# Patient Record
Sex: Female | Born: 1945
Health system: Southern US, Community
[De-identification: ages and names within clinical notes are randomized; demographics above are authoritative.]

## PROBLEM LIST (undated history)

## (undated) DIAGNOSIS — E785 Hyperlipidemia, unspecified: Secondary | ICD-10-CM

## (undated) DIAGNOSIS — C801 Malignant (primary) neoplasm, unspecified: Secondary | ICD-10-CM

## (undated) DIAGNOSIS — A4101 Sepsis due to Methicillin susceptible Staphylococcus aureus: Secondary | ICD-10-CM

## (undated) DIAGNOSIS — K625 Hemorrhage of anus and rectum: Secondary | ICD-10-CM

## (undated) DIAGNOSIS — K08109 Complete loss of teeth, unspecified cause, unspecified class: Secondary | ICD-10-CM

## (undated) DIAGNOSIS — F32A Depression, unspecified: Secondary | ICD-10-CM

## (undated) DIAGNOSIS — F419 Anxiety disorder, unspecified: Secondary | ICD-10-CM

## (undated) DIAGNOSIS — F329 Major depressive disorder, single episode, unspecified: Secondary | ICD-10-CM

## (undated) DIAGNOSIS — Z972 Presence of dental prosthetic device (complete) (partial): Secondary | ICD-10-CM

## (undated) DIAGNOSIS — Z973 Presence of spectacles and contact lenses: Secondary | ICD-10-CM

## (undated) DIAGNOSIS — Z923 Personal history of irradiation: Secondary | ICD-10-CM

## (undated) HISTORY — DX: Personal history of irradiation: Z92.3

## (undated) HISTORY — DX: Depression, unspecified: F32.A

## (undated) HISTORY — DX: Anxiety disorder, unspecified: F41.9

## (undated) HISTORY — DX: Hemorrhage of anus and rectum: K62.5

## (undated) HISTORY — DX: Hyperlipidemia, unspecified: E78.5

## (undated) HISTORY — DX: Major depressive disorder, single episode, unspecified: F32.9

## (undated) HISTORY — PX: TUBAL LIGATION: SHX77

---

## 1976-11-01 HISTORY — PX: ABDOMINAL HYSTERECTOMY: SHX81

## 1978-11-01 HISTORY — PX: HEMORRHOID SURGERY: SHX153

## 2005-09-13 ENCOUNTER — Other Ambulatory Visit: Admission: RE | Admit: 2005-09-13 | Discharge: 2005-09-13 | Payer: Self-pay | Admitting: Obstetrics and Gynecology

## 2005-11-15 ENCOUNTER — Encounter: Admission: RE | Admit: 2005-11-15 | Discharge: 2006-02-13 | Payer: Self-pay | Admitting: Obstetrics and Gynecology

## 2007-03-13 ENCOUNTER — Ambulatory Visit: Payer: Self-pay | Admitting: Cardiology

## 2007-07-23 ENCOUNTER — Emergency Department (HOSPITAL_COMMUNITY): Admission: EM | Admit: 2007-07-23 | Discharge: 2007-07-23 | Payer: Self-pay | Admitting: Emergency Medicine

## 2007-07-23 IMAGING — CR DG LUMBAR SPINE COMPLETE 4+V
5 series · 5 of 5 positions shown · non-contrast
Comparison: none

CLINICAL DATA: Low back pain.
 LUMBAR SPINE ? 5 VIEW:

[t l-spine a.p.]
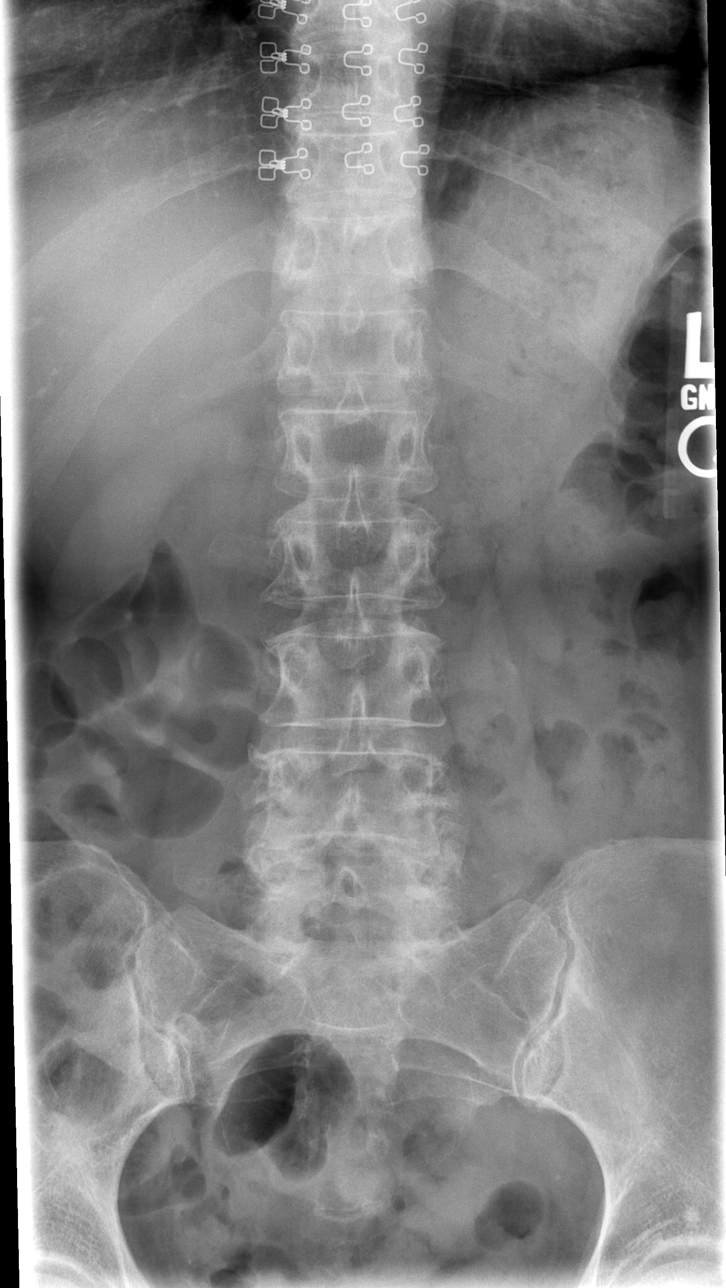

[t l-spine oblique exposure (1 of 2)]
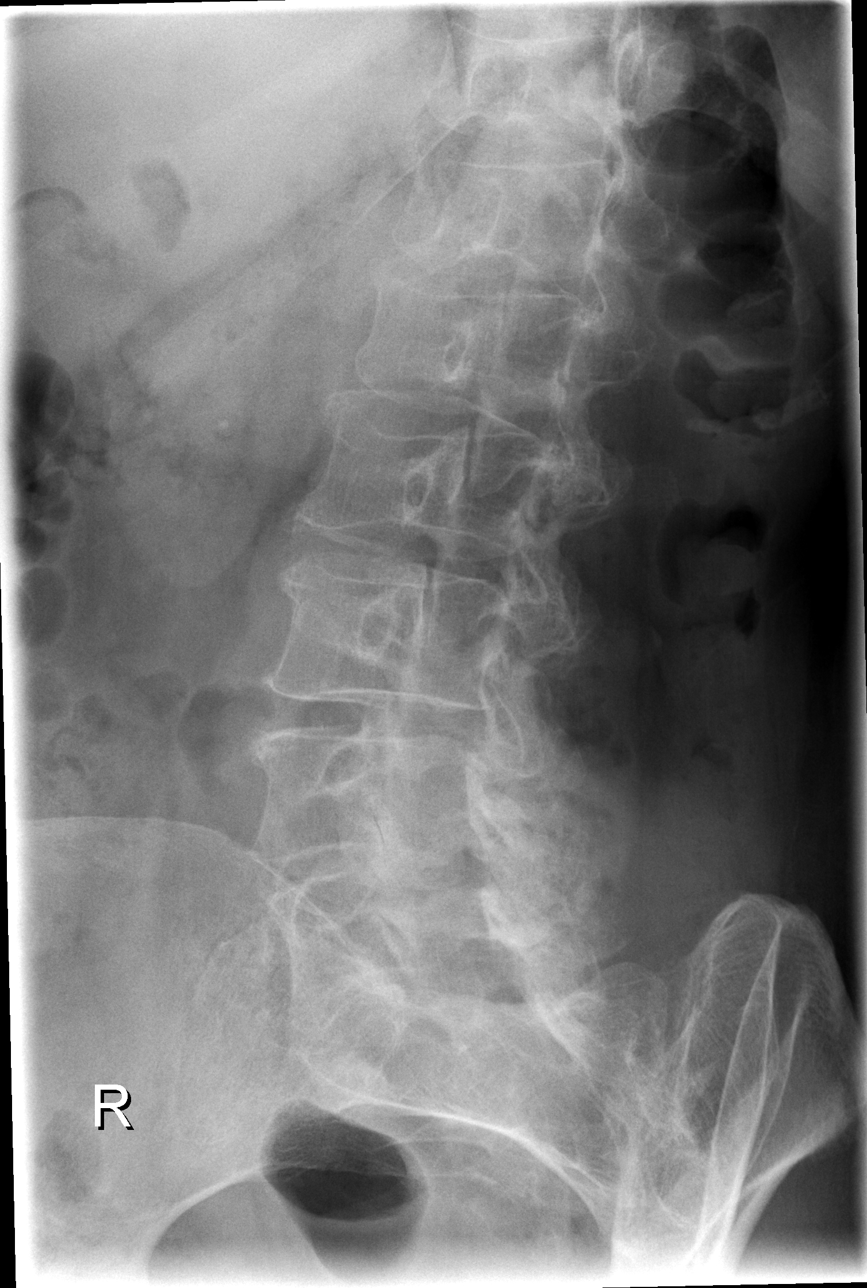

[t l-spine oblique exposure (2 of 2)]
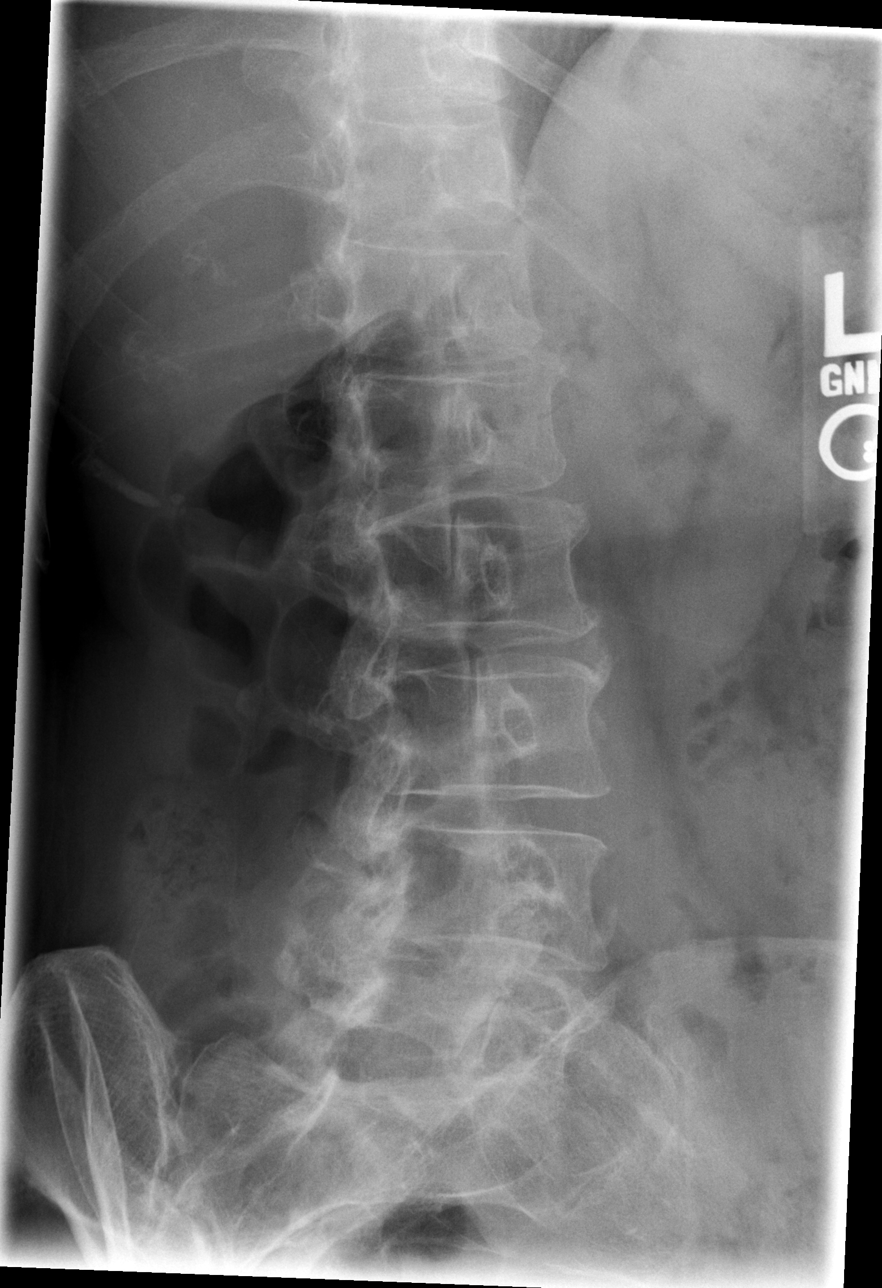

[t l-spine lat]
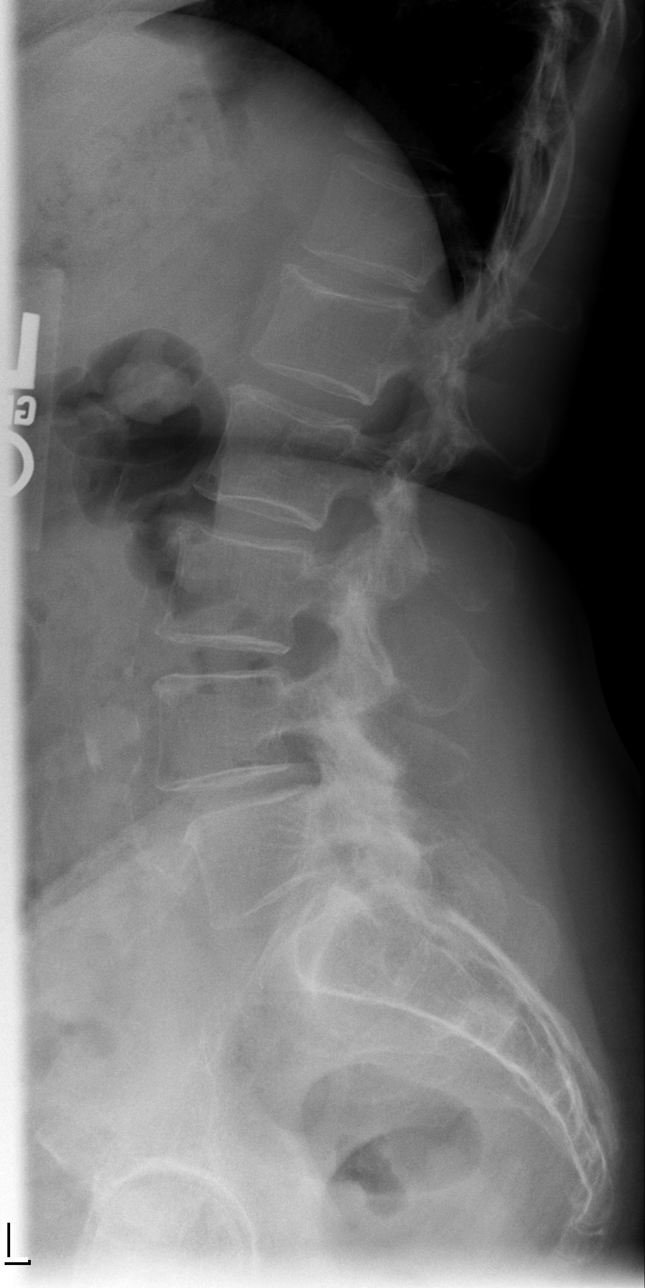

[t l-spine l5-s1 spot]
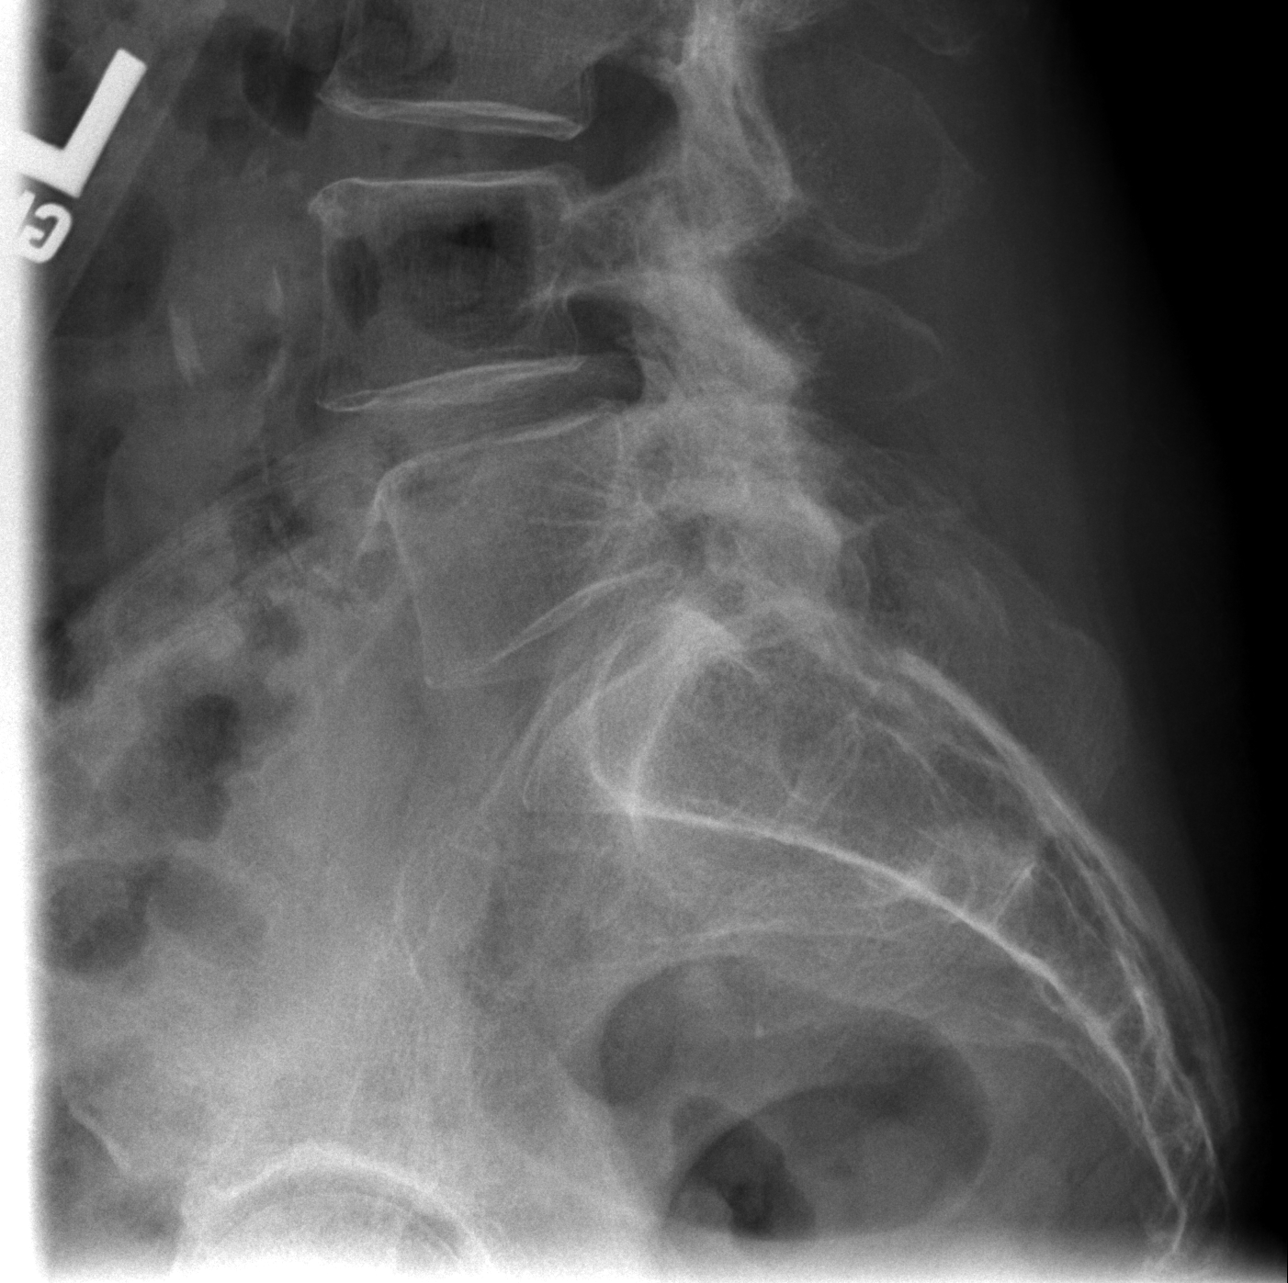

[5 of 5 positions shown; findings below may reference images not displayed]

FINDINGS: Diffuse osteopenia. Mild Grade 1 anterolisthesis of L-4 on L-5 7-8 mm associated with degenerative facet arthropathy. Minimal disk space narrowing and mild vertebral body osteophytic formation at L4-5 secondary to degenerative disk disease.   SI joints are unremarkable. 3 mm calculus mid to inferior aspect right kidney.
IMPRESSION: Degenerative spondylosis as described above. Small right renal calculus.

## 2010-07-10 ENCOUNTER — Encounter: Admission: RE | Admit: 2010-07-10 | Discharge: 2010-07-10 | Payer: Self-pay | Admitting: Obstetrics and Gynecology

## 2010-11-22 ENCOUNTER — Encounter: Payer: Self-pay | Admitting: Obstetrics and Gynecology

## 2011-03-16 NOTE — Assessment & Plan Note (Signed)
[facility]                            CARDIOLOGY OFFICE NOTE   NAME:[name], [name]                        MRN:          [id number]  DATE:03/13/2007                            DOB:          December 12, 1945    REFERRING PHYSICIAN:  Juluis Mire, M.D.   I was asked by Dr. Richardean Chimera to evaluate [name] for  hyperlipidemia and cardiac risk factors.   HISTORY OF PRESENT ILLNESS:  She is a 65 year old, divorced, white  female, mother of one who moved here from [place] to live near her  daughter.   She says that several of her siblings have hyperlipidemia and she thinks  hers is hereditary.   Her total cholesterol was 266, triglycerides 80, HDL 71, LDL 179, VLDL  16. Her total cholesterol/HDL ratio calculates at 3.7. As I told her,  this is outstanding.   She quit smoking in 1993 but was a one pack per day smoker prior to  that. She does drink a little bit of caffeinated coffee a day.   She walks 3 hours a week. She has for years.   She does not have hypertension or diabetes. She is only minimally  overweight.   PAST MEDICAL HISTORY:  She has no known drug allergies. She is currently  on Cymbalta 60 mg a day for depression and a multivitamin.   PAST SURGICAL HISTORY:  Hysterectomy in 1982, hemorrhoidectomy in 1984,  cyst on her arm removed in 1976.   FAMILY HISTORY:  Remarkable for hyperlipidemia. There is no premature  coronary artery disease however.   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  She is divorced and has one daughter.   REVIEW OF SYSTEMS:  History of menstrual dysfunction, anxiety and  depression, otherwise negative review of systems.   PHYSICAL EXAMINATION:  GENERAL:  She is very pleasant.  VITAL SIGNS:  5 foot 4, weighs 151 pounds. Her blood pressure is 122/80,  her pulse was 60 and regular. Her EKG is completely normal except for  nonspecific flattening of her ST segments V1 through  V6.  HEENT:  Normocephalic, atraumatic. PERRLA. Extraocular movements intact.  Sclera clear. Facial symmetry is normal.  NECK:  Carotid upstrokes equal bilaterally without bruits. There is no  JVD. Thyroid is not enlarged. Neck is supple.  LUNGS:  Clear to auscultation.  HEART:  Reveals a nondisplaced PMI. She has normal S1 and S2.  ABDOMEN:  Soft with good bowel sounds, no midline bruit, no  hepatomegaly.  EXTREMITIES:  Reveal no cyanosis, clubbing or edema. Pulses are intact.  NEUROLOGIC:  Intact.  SKIN:  Within normal limits.   ASSESSMENT/PLAN:  Hyperlipidemia with a very favorable total  cholesterol/HDL ratio. With no other significant risk factors other than  age, I would not treat this with pharmacological therapy. It also should  be noted that she walks 3 hours plus a week which reduces her risk by  20% plus of a coronary event.   I have made no specific recommendations other than continue her walking  program, walk her blood pressure  and keep her weight under control. I  will see her back on a p.r.n. basis.     Thomas C. Daleen Squibb, MD, [facility]  Electronically Signed    TCW/MedQ  DD: 03/13/2007  DT: 03/13/2007  Job #: [id number]   cc:   Juluis Mire, M.D.

## 2011-08-12 LAB — URINALYSIS, ROUTINE W REFLEX MICROSCOPIC
Glucose, UA: NEGATIVE
Ketones, ur: NEGATIVE
Protein, ur: NEGATIVE
Urobilinogen, UA: 0.2

## 2011-08-25 ENCOUNTER — Encounter (INDEPENDENT_AMBULATORY_CARE_PROVIDER_SITE_OTHER): Payer: Self-pay | Admitting: General Surgery

## 2011-09-07 ENCOUNTER — Ambulatory Visit (INDEPENDENT_AMBULATORY_CARE_PROVIDER_SITE_OTHER): Payer: BC Managed Care – PPO | Admitting: General Surgery

## 2011-09-07 ENCOUNTER — Encounter (INDEPENDENT_AMBULATORY_CARE_PROVIDER_SITE_OTHER): Payer: Self-pay | Admitting: General Surgery

## 2011-09-07 VITALS — BP 144/86 | HR 60 | Temp 97.4°F | Resp 16 | Ht 65.0 in | Wt 145.2 lb

## 2011-09-07 DIAGNOSIS — Z803 Family history of malignant neoplasm of breast: Secondary | ICD-10-CM

## 2011-09-07 DIAGNOSIS — K624 Stenosis of anus and rectum: Secondary | ICD-10-CM

## 2011-09-07 DIAGNOSIS — E785 Hyperlipidemia, unspecified: Secondary | ICD-10-CM | POA: Insufficient documentation

## 2011-09-07 DIAGNOSIS — K648 Other hemorrhoids: Secondary | ICD-10-CM

## 2011-09-07 NOTE — Progress Notes (Signed)
Chief Complaint  Patient presents with  . Rectal Problems    Eval of hemorrhoids.    HPI [name] is a 65 y.o. female.    She is referred to me by Laurann Montana  for evaluation of bleeding hemorrhoids. The patient states that she had a hemorrhoidectomy 30 years ago. For the past 2 months she's been having some rectal bleeding. This occurs really during and after bowel movements. She denies pain or protrusion or prolapse. Her last colonoscopy was with one of the [facility] doctors in 2009 and she was told everything was normal except she may have had one small polyp. She has tried suppositories without relief. HPI  Past Medical History  Diagnosis Date  . Depression   . Hyperlipidemia   . Vitamin D deficiency   . Anxiety   . Rectal bleeding     Past Surgical History  Procedure Date  . Abdominal hysterectomy 1978  . Hemorrhoid surgery 1980    Patient had to guess on the date.    Family History  Problem Relation Age of Onset  . Cancer Sister     breast  . Alcohol abuse Maternal Uncle     Social History History  Substance Use Topics  . Smoking status: Former Smoker    Quit date: 09/06/1997  . Smokeless tobacco: Never Used  . Alcohol Use: No    No Known Allergies  Current Outpatient Prescriptions  Medication Sig Dispense Refill  . ECHINACEA PO Take by mouth as needed. Patient unsure of the dosage.       . Garlic 1000 MG CAPS Take by mouth as needed.        [name] MILK THISTLE PO Take 1,000 mg by mouth as needed.        [name] ascorbic acid (VITAMIN C) 500 MG/5ML syrup Take 500 mg by mouth daily.        . busPIRone (BUSPAR) 10 MG tablet Take 5 mg by mouth 2 (two) times daily.        . Calcium-Vitamin D (CALTRATE 600 PLUS-VIT D PO) Take by mouth 1 day or 1 dose.        . cholecalciferol (VITAMIN D) 1000 UNITS tablet Take 4,000 Units by mouth daily.        . DULoxetine (CYMBALTA) 60 MG capsule Take 60 mg by mouth daily.        . folic acid (FOLVITE) 1 MG tablet Take 1 mg by  mouth daily.        [name] ibuprofen (ADVIL,MOTRIN) 800 MG tablet Take 800 mg by mouth every 8 (eight) hours as needed.        . Multiple Vitamins-Minerals (MULTIVITAMIN WITH MINERALS) tablet Take 1 tablet by mouth daily.        . niacin 100 MG tablet Take 100 mg by mouth daily with breakfast.        . pravastatin (PRAVACHOL) 40 MG tablet Take 40 mg by mouth daily.        . vitamin E 100 UNIT capsule Take 100 Units by mouth daily.          Review of Systems Review of Systems  Constitutional: Negative for fever, chills and unexpected weight change.  HENT: Negative for hearing loss, congestion, sore throat, trouble swallowing and voice change.   Eyes: Negative for visual disturbance.  Respiratory: Negative for cough and wheezing.   Cardiovascular: Negative for chest pain, palpitations and leg swelling.  Gastrointestinal: Positive for constipation and anal bleeding. Negative  for nausea, vomiting, abdominal pain, diarrhea, blood in stool and abdominal distention.       Does not drink much water.  Genitourinary: Negative for hematuria, vaginal bleeding and difficulty urinating.  Musculoskeletal: Negative for arthralgias.  Skin: Negative for rash and wound.  Neurological: Negative for seizures, syncope and headaches.  Hematological: Negative for adenopathy. Does not bruise/bleed easily.  Psychiatric/Behavioral: Negative for confusion. The patient is nervous/anxious.     Blood pressure 144/86, pulse 60, temperature 97.4 F (36.3 C), temperature source Temporal, resp. rate 16, height 5\' 5"  (1.651 m), weight 145 lb 3.2 oz (65.862 kg).  Physical Exam Physical Exam  Constitutional: She is oriented to person, place, and time. She appears well-developed and well-nourished. No distress.       pleasant  HENT:  Head: Normocephalic and atraumatic.  Nose: Nose normal.  Mouth/Throat: No oropharyngeal exudate.  Eyes: Conjunctivae and EOM are normal. Pupils are equal, round, and reactive to light. Left eye  exhibits no discharge. No scleral icterus.  Neck: Neck supple. No JVD present. No tracheal deviation present. No thyromegaly present.  Cardiovascular: Normal rate, regular rhythm, normal heart sounds and intact distal pulses.   No murmur heard. Pulmonary/Chest: Effort normal and breath sounds normal. No respiratory distress. She has no wheezes. She has no rales. She exhibits no tenderness.  Abdominal: Soft. Bowel sounds are normal. She exhibits no distension and no mass. There is no tenderness. There is no rebound and no guarding.  Genitourinary:     Musculoskeletal: She exhibits no edema and no tenderness.  Lymphadenopathy:    She has no cervical adenopathy.  Neurological: She is alert and oriented to person, place, and time. She exhibits normal muscle tone. Coordination normal.  Skin: Skin is warm and dry. No rash noted. She is not diaphoretic. No erythema. No pallor.  Psychiatric: She has a normal mood and affect. Her behavior is normal. Judgment and thought content normal.    Data Reviewed I reviewed an office note from Dr. Cliffton Asters  Assessment    Bleeding internal hemorrhoids, injected today.  Status post surgical hemorrhoidectomy many years ago. She has residual scarring and probable anal stenosis.  Hyperlipidemia  Anxiety and depression  Status post abdominal hysterectomy and bilateral oophorectomy.    Plan    I injected internal hemorrhoids with sclerosing solution on the right side and left side today. She tolerated this well although it was somewhat uncomfortable.  I informed her that she had internal hemorrhoids and also an anal stenosis.  She was advised in hydration, high-fiber low-fat diet, and the use of Metamucil b.i.d.  I told her that if the bleeding is not resolve within one month, to return to the office for further evaluation.     Sherian Valenza M 09/07/2011, 9:14 AM

## 2011-09-07 NOTE — Patient Instructions (Signed)
We injected a medicine into your internal hemorrhoids today, which will hopefully resolve the bleeding. There is also a narrowing of your anal canal, presumably from scarring from previous surgery.You are advised to drink 5-6 glasses of water a day, follow a high-fiber, low-fat diet. Your are advised to take Metamucil capsules, 3-4 capsules twice a day. If the bleeding does not resolve within one month, please return to see me for further evaluation. You should have another colonoscopy in approximately 2 years.

## 2011-11-01 ENCOUNTER — Ambulatory Visit (INDEPENDENT_AMBULATORY_CARE_PROVIDER_SITE_OTHER): Payer: BC Managed Care – PPO | Admitting: General Surgery

## 2011-11-01 ENCOUNTER — Encounter (INDEPENDENT_AMBULATORY_CARE_PROVIDER_SITE_OTHER): Payer: Self-pay | Admitting: General Surgery

## 2011-11-01 VITALS — BP 152/88 | HR 77 | Temp 98.0°F | Ht 65.0 in | Wt 150.8 lb

## 2011-11-01 DIAGNOSIS — K624 Stenosis of anus and rectum: Secondary | ICD-10-CM

## 2011-11-01 DIAGNOSIS — K648 Other hemorrhoids: Secondary | ICD-10-CM

## 2011-11-01 NOTE — Patient Instructions (Signed)
We will schedule you for surgery. We will examine you once you are sleep to make sure there are no polyps in the upper rectum. We will dilate the anal stenosis. We will conservatively removed the internal hemorrhoids.\  Hemorrhoidectomy Hemorrhoidectomy is surgery to remove hemorrhoids. Hemorrhoids are veins that have become swollen in the rectum. The rectum is the area from the bottom end of the intestines to the opening where bowel movements leave the body. Hemorrhoids can be uncomfortable. They can cause itching, bleeding and pain if a blood clot forms in them (thrombose). If hemorrhoids are small, surgery may not be needed. But if they cover a larger area, surgery is usually suggested.  LET YOUR CAREGIVER KNOW ABOUT:   Any allergies.   All medications you are taking, including:   Herbs, eyedrops, over-the-counter medications and creams.   Blood thinners (anticoagulants), aspirin or other drugs that could affect blood clotting.   Use of steroids (by mouth or as creams).   Previous problems with anesthetics, including local anesthetics.   Possibility of pregnancy, if this applies.   Any history of blood clots.   Any history of bleeding or other blood problems.   Previous surgery.   Smoking history.   Other health problems.  RISKS AND COMPLICATIONS All surgery carries some risk. However, hemorrhoid surgery usually goes smoothly. Possible complications could include:  Urinary retention.   Bleeding.   Infection.   A painful incision.   A reaction to the anesthesia (this is not common).  BEFORE THE PROCEDURE   Stop using aspirin and non-steroidal anti-inflammatory drugs (NSAIDs) for pain relief. This includes prescription drugs and over-the-counter drugs such as ibuprofen and naproxen. Also stop taking vitamin E. If possible, do this two weeks before your surgery.   If you take blood-thinners, ask your healthcare provider when you should stop taking them.   You will  probably have blood and urine tests done several days before your surgery.   Do not eat or drink for about 8 hours before the surgery.   Arrive at least an hour before the surgery, or whenever your surgeon recommends. This will give you time to check in and fill out any needed paperwork.   Hemorrhoidectomy is often an outpatient procedure. This means you will be able to go home the same day. Sometimes, though, people stay overnight in the hospital after the procedure. Ask your surgeon what to expect. Either way, make arrangements in advance for someone to drive you home.  PROCEDURE   The preparation:   You will change into a hospital gown.   You will be given an IV. A needle will be inserted in your arm. Medication can flow directly into your body through this needle.   You might be given an enema to clear your rectum.   Once in the operating room, you will probably lie on your side or be repositioned later to lying on your stomach.   You will be given anesthesia (medication) so you will not feel anything during the surgery. The surgery often is done with local anesthesia (the area near the hemorrhoids will be numb and you will be drowsy but awake). Sometimes, general anesthesia is used (you will be asleep during the procedure).   The procedure:   There are a few different procedures for hemorrhoids. Be sure to ask you surgeon about the procedure, the risks and benefits.   Be sure to ask about what you need to do to take care of the wound, if there  is one.  AFTER THE PROCEDURE  You will stay in a recovery area until the anesthesia has worn off. Your blood pressure and pulse will be checked every so often.   You may feel a lot of pain in the area of the rectum.   Take all pain medication prescribed by your surgeon. Ask before taking any over-the-counter pain medicines.   Sometimes sitting in a warm bath can help relieve your pain.   To make sure you have bowel movements without  straining:   You will probably need to take stool softeners (usually a pill) for a few days.   You should drink 8 to 10 glasses of water each day.   Your activity will be restricted for awhile. Ask your caregiver for a list of what you should and should not do while you recover.  Document Released: 08/15/2009 Document Revised: 06/30/2011 Document Reviewed: 08/15/2009 [facility] Patient Information 2012 [place], [place].

## 2011-11-01 NOTE — Progress Notes (Signed)
Patient ID: [name], female   DOB: 03-23-1946, 65 y.o.   MRN: [id number]  Chief Complaint  Patient presents with  . Follow-up    bleeding hems/discuss sx    HPI [name] is a 65 y.o. female.  She returns to see me because of rectal bleeding.  I saw her in November of 2012. At that time she had some trivial external hemorrhoids, palpable internal hemorrhoids, and a mild case of anal stenosis. We injected her hemorrhoids.  She had a colonoscopy with [facility] in 2009 and was told that was normal.  Since I saw her she continues to have daily, low volume bright red rectal bleeding. Sometimes during her stools and sometimes just spontaneously. She is not passing any clots. This is low volume. She says her bowel movements are brown in color.  Another complaint is that she has some difficulty going to the bathroom and she has to go multiple times a day and passages to little bit each time. She denies pain with bowel movements. HPI  Past Medical History  Diagnosis Date  . Depression   . Hyperlipidemia   . Vitamin D deficiency   . Anxiety   . Rectal bleeding     Past Surgical History  Procedure Date  . Abdominal hysterectomy 1978  . Hemorrhoid surgery 1980    Patient had to guess on the date.    Family History  Problem Relation Age of Onset  . Cancer Sister     breast  . Alcohol abuse Maternal Uncle     Social History History  Substance Use Topics  . Smoking status: Former Smoker    Quit date: 09/06/1997  . Smokeless tobacco: Never Used  . Alcohol Use: No    No Known Allergies  Current Outpatient Prescriptions  Medication Sig Dispense Refill  . ascorbic acid (VITAMIN C) 500 MG/5ML syrup Take 500 mg by mouth daily.        . busPIRone (BUSPAR) 10 MG tablet Take 5 mg by mouth 2 (two) times daily.        . Calcium-Vitamin D (CALTRATE 600 PLUS-VIT D PO) Take by mouth 1 day or 1 dose.        . cholecalciferol (VITAMIN D) 1000 UNITS tablet Take 4,000 Units by  mouth daily.        . DULoxetine (CYMBALTA) 60 MG capsule Take 60 mg by mouth daily.        [name] ECHINACEA PO Take by mouth as needed. Patient unsure of the dosage.       . folic acid (FOLVITE) 1 MG tablet Take 1 mg by mouth daily.        . Garlic 1000 MG CAPS Take by mouth as needed.        [name] ibuprofen (ADVIL,MOTRIN) 800 MG tablet Take 800 mg by mouth every 8 (eight) hours as needed.        [name] MILK THISTLE PO Take 1,000 mg by mouth as needed.        . Multiple Vitamins-Minerals (MULTIVITAMIN WITH MINERALS) tablet Take 1 tablet by mouth daily.        . niacin 100 MG tablet Take 100 mg by mouth daily with breakfast.        . pravastatin (PRAVACHOL) 40 MG tablet Take 40 mg by mouth daily.        . vitamin E 100 UNIT capsule Take 100 Units by mouth daily.          Review  of Systems Review of Systems  Constitutional: Negative for fever, chills and unexpected weight change.  HENT: Negative for hearing loss, congestion, sore throat, trouble swallowing and voice change.   Eyes: Negative for visual disturbance.  Respiratory: Negative for cough and wheezing.   Cardiovascular: Negative for chest pain, palpitations and leg swelling.  Gastrointestinal: Positive for anal bleeding. Negative for nausea, vomiting, abdominal pain, diarrhea, constipation, blood in stool and abdominal distention.  Genitourinary: Negative for hematuria, vaginal bleeding and difficulty urinating.  Musculoskeletal: Negative for arthralgias.  Skin: Negative for rash and wound.  Neurological: Negative for seizures, syncope and headaches.  Hematological: Negative for adenopathy. Does not bruise/bleed easily.  Psychiatric/Behavioral: Negative for confusion.    Blood pressure 152/88, pulse 77, temperature 98 F (36.7 C), temperature source Temporal, height 5\' 5"  (1.651 m), weight 150 lb 12.8 oz (68.402 kg), SpO2 98.00%.  Physical Exam Physical Exam  Constitutional: She is oriented to person, place, and time. She appears  well-developed and well-nourished. No distress.  HENT:  Head: Normocephalic and atraumatic.  Nose: Nose normal.  Eyes: Conjunctivae and EOM are normal. Pupils are equal, round, and reactive to light. Left eye exhibits no discharge. No scleral icterus.  Neck: Neck supple. No JVD present. No tracheal deviation present. No thyromegaly present.  Cardiovascular: Normal rate, regular rhythm, normal heart sounds and intact distal pulses.   No murmur heard. Pulmonary/Chest: Effort normal and breath sounds normal. No respiratory distress. She has no wheezes. She has no rales. She exhibits no tenderness.  Abdominal: Soft. Bowel sounds are normal. She exhibits no distension and no mass. There is no tenderness. There is no rebound and no guarding.    Genitourinary:     Lymphadenopathy:    She has no cervical adenopathy.  Neurological: She is alert and oriented to person, place, and time. She exhibits normal muscle tone. Coordination normal.  Skin: Skin is warm. No rash noted. She is not diaphoretic. No erythema. No pallor.  Psychiatric: She has a normal mood and affect. Her behavior is normal. Judgment and thought content normal.    Data Reviewed:      old records  Assessment   Rectal bleeding. This is most likely due to anal canal source, most likely residual internal hemorrhoids or polyps.  Anal stenosis  Asymptomatic external hemorrhoids  History of surgical hemorrhoidectomy  Status post open abdominal hysterectomy.     Plan    I think that the next step in her care is examination under anesthesia, anal dilatation, rigid proctoscopy to rule out higher source in the rectum, and probably very conservative excision of the internal hemorrhoids that I can palpate. These hemorrhoids are too firm and too rigid to consider banding.  I told her that we would have to be very conservative because of her scar tissue from her previous hemorrhoidectomy. The risk of the surgery would be bleeding,  infection, repeat stenosis, sphincter damage. . I told her that this hopefully resolve her problems but if not, she would need to be referred to a colorectal specialist and probably consider skin flap advancement for her anal stenosis.  I offered to refer her to another physician and she declined that.  We will schedule the surgery in the near future.       Damoney Julia M 11/01/2011, 9:12 AM

## 2011-11-03 ENCOUNTER — Encounter (HOSPITAL_COMMUNITY): Payer: Self-pay | Admitting: Pharmacy Technician

## 2011-11-08 ENCOUNTER — Encounter (HOSPITAL_COMMUNITY)
Admission: RE | Admit: 2011-11-08 | Discharge: 2011-11-08 | Disposition: A | Discharge status: Home / Self Care | Payer: BC Managed Care – PPO | Source: Ambulatory Visit | Attending: General Surgery | Admitting: General Surgery

## 2011-11-08 ENCOUNTER — Encounter (HOSPITAL_COMMUNITY): Payer: Self-pay

## 2011-11-08 LAB — URINALYSIS, ROUTINE W REFLEX MICROSCOPIC
Bilirubin Urine: NEGATIVE
Glucose, UA: NEGATIVE mg/dL
Hgb urine dipstick: NEGATIVE
Nitrite: NEGATIVE
Specific Gravity, Urine: 1.005 (ref 1.005–1.030)
pH: 7 (ref 5.0–8.0)

## 2011-11-08 LAB — DIFFERENTIAL
Lymphocytes Relative: 40 % (ref 12–46)
Monocytes Absolute: 0.4 10*3/uL (ref 0.1–1.0)
Monocytes Relative: 8 % (ref 3–12)
Neutro Abs: 2.8 10*3/uL (ref 1.7–7.7)

## 2011-11-08 LAB — COMPREHENSIVE METABOLIC PANEL
AST: 20 U/L (ref 0–37)
Albumin: 4.1 g/dL (ref 3.5–5.2)
Calcium: 9.8 mg/dL (ref 8.4–10.5)
Chloride: 104 mEq/L (ref 96–112)
Creatinine, Ser: 0.66 mg/dL (ref 0.50–1.10)
Sodium: 141 mEq/L (ref 135–145)

## 2011-11-08 LAB — URINE MICROSCOPIC-ADD ON

## 2011-11-08 LAB — CBC
MCH: 30.1 pg (ref 26.0–34.0)
MCV: 91.3 fL (ref 78.0–100.0)
Platelets: 295 10*3/uL (ref 150–400)
RDW: 12.3 % (ref 11.5–15.5)
WBC: 5.7 10*3/uL (ref 4.0–10.5)

## 2011-11-08 LAB — SURGICAL PCR SCREEN: Staphylococcus aureus: NEGATIVE

## 2011-11-08 NOTE — Patient Instructions (Signed)
20 [name]  11/08/2011   Your procedure is scheduled on:  Wednesday 11/10/2011  Report to [facility] at 1130 AM.  Call this number if you have problems the morning of surgery: [phone]   Remember:   Do not eat food:After Midnight.  May have clear liquids:up to 6 Hours before arrival-nothing to drink after 0730 am.  Clear liquids include soda, tea, black coffee, apple or grape juice, broth.  Take these medicines the morning of surgery with A SIP OF WATER: Buspar, Cymbalta, Pravachol   Do not wear jewelry, make-up or nail polish.  Do not wear lotions, powders, or perfumes.   Do not shave 48 hours prior to surgery.(women only-No shaving legs)  Do not bring valuables to the hospital.  Contacts, dentures or bridgework may not be worn into surgery.  Leave suitcase in the car. After surgery it may be brought to your room.  For patients admitted to the hospital, checkout time is 11:00 AM the day of discharge.   Patients discharged the day of surgery will not be allowed to drive home.  Name and phone number of your driver: Clyda Hurdle , [phone]  Special Instructions: CHG Shower Use Special Wash: 1/2 bottle night before surgery and 1/2 bottle morning of surgery.   Please read over the following fact sheets that you were given: MRSA Information

## 2011-11-09 NOTE — H&P (Signed)
[name]   History and Physical  MRN: [id number]   Description: 66 year old female  Provider: [name], MD  Department: [facility]     Patient ID: [name], female   DOB: Nov 13, 1945, 66 y.o.   MRN: [id number]    Chief Complaint   Patient presents with   .  Follow-up       bleeding hems/discuss sx      HPI [name] is a 66 y.o. female.  She returns to see me because of rectal bleeding.  I saw her in November of 2012. At that time she had some trivial external hemorrhoids, palpable internal hemorrhoids, and a mild case of anal stenosis. We injected her hemorrhoids.  She had a colonoscopy with [facility] in 2009 and was told that was normal.  Since I saw her she continues to have daily, low volume bright red rectal bleeding. Sometimes during her stools and sometimes just spontaneously. She is not passing any clots. This is low volume. She says her bowel movements are brown in color.  Another complaint is that she has some difficulty going to the bathroom and she has to go multiple times a day and passages to little bit each time. She denies pain with bowel movements.     Past Medical History   Diagnosis  Date   .  Depression     .  Hyperlipidemia     .  Vitamin D deficiency     .  Anxiety     .  Rectal bleeding         Past Surgical History   Procedure  Date   .  Abdominal hysterectomy  1978   .  Hemorrhoid surgery  1980       Patient had to guess on the date.       Family History   Problem  Relation  Age of Onset   .  Cancer  Sister         breast   .  Alcohol abuse  Maternal Uncle        Social History History   Substance Use Topics   .  Smoking status:  Former Smoker       Quit date:  09/06/1997   .  Smokeless tobacco:  Never Used   .  Alcohol Use:  No      No Known Allergies    Current Outpatient Prescriptions   Medication  Sig  Dispense  Refill   .  ascorbic acid (VITAMIN C) 500 MG/5ML syrup  Take 500 mg by mouth  daily.           .  busPIRone (BUSPAR) 10 MG tablet  Take 5 mg by mouth 2 (two) times daily.           .  Calcium-Vitamin D (CALTRATE 600 PLUS-VIT D PO)  Take by mouth 1 day or 1 dose.           .  cholecalciferol (VITAMIN D) 1000 UNITS tablet  Take 4,000 Units by mouth daily.           .  DULoxetine (CYMBALTA) 60 MG capsule  Take 60 mg by mouth daily.           [name]  ECHINACEA PO  Take by mouth as needed. Patient unsure of the dosage.          .  folic acid (FOLVITE) 1 MG tablet  Take 1 mg  by mouth daily.           .  Garlic 1000 MG CAPS  Take by mouth as needed.           [name]  ibuprofen (ADVIL,MOTRIN) 800 MG tablet  Take 800 mg by mouth every 8 (eight) hours as needed.           [name]  MILK THISTLE PO  Take 1,000 mg by mouth as needed.           .  Multiple Vitamins-Minerals (MULTIVITAMIN WITH MINERALS) tablet  Take 1 tablet by mouth daily.           .  niacin 100 MG tablet  Take 100 mg by mouth daily with breakfast.           .  pravastatin (PRAVACHOL) 40 MG tablet  Take 40 mg by mouth daily.           .  vitamin E 100 UNIT capsule  Take 100 Units by mouth daily.              Review of Systems   Constitutional: Negative for fever, chills and unexpected weight change.  HENT: Negative for hearing loss, congestion, sore throat, trouble swallowing and voice change.   Eyes: Negative for visual disturbance.  Respiratory: Negative for cough and wheezing.   Cardiovascular: Negative for chest pain, palpitations and leg swelling.  Gastrointestinal: Positive for anal bleeding. Negative for nausea, vomiting, abdominal pain, diarrhea, constipation, blood in stool and abdominal distention.  Genitourinary: Negative for hematuria, vaginal bleeding and difficulty urinating.  Musculoskeletal: Negative for arthralgias.  Skin: Negative for rash and wound.  Neurological: Negative for seizures, syncope and headaches.  Hematological: Negative for adenopathy. Does not bruise/bleed easily.  Psychiatric/Behavioral:  Negative for confusion.    Blood pressure 152/88, pulse 77, temperature 98 F (36.7 C), temperature source Temporal, height 5\' 5"  (1.651 m), weight 150 lb 12.8 oz (68.402 kg), SpO2 98.00%.  Physical Exam  Constitutional: She is oriented to person, place, and time. She appears well-developed and well-nourished. No distress.  HENT:   Head: Normocephalic and atraumatic.   Nose: Nose normal.  Eyes: Conjunctivae and EOM are normal. Pupils are equal, round, and reactive to light. Left eye exhibits no discharge. No scleral icterus.  Neck: Neck supple. No JVD present. No tracheal deviation present. No thyromegaly present.  Cardiovascular: Normal rate, regular rhythm, normal heart sounds and intact distal pulses.    No murmur heard. Pulmonary/Chest: Effort normal and breath sounds normal. No respiratory distress. She has no wheezes. She has no rales. She exhibits no tenderness.  Abdominal: Soft. Bowel sounds are normal. She exhibits no distension and no mass. There is no tenderness. There is no rebound and no guarding. Pfannensteil scar.  Genitourinary: small external hemorrhoids, mild anal canal stenosis. Smooth internal hemorrhoids. Some blood on exam finger.  Lymphadenopathy:    She has no cervical adenopathy.  Neurological: She is alert and oriented to person, place, and time. She exhibits normal muscle tone. Coordination normal.  Skin: Skin is warm. No rash noted. She is not diaphoretic. No erythema. No pallor.  Psychiatric: She has a normal mood and affect. Her behavior is normal. Judgment and thought content normal.    Assessment  Rectal bleeding. This is most likely due to anal canal source, most likely residual internal hemorrhoids or polyps.  Anal stenosis, acquired.  Asymptomatic external hemorrhoids  History of surgical hemorrhoidectomy  Status post open abdominal hysterectomy.  Plan   I think that the next step in her care is examination under anesthesia, anal  dilatation, rigid proctoscopy to rule out higher source in the rectum, and probably very conservative excision of the internal hemorrhoids that I can palpate. These hemorrhoids are too firm and too rigid to consider banding.  I told her that we would have to be very conservative because of her scar tissue from her previous hemorrhoidectomy. The risk of the surgery would be bleeding, infection, repeat stenosis, sphincter damage. . I told her that this hopefully resolve her problems but if not, she would need to be referred to a colorectal specialist and probably consider skin flap advancement for her anal stenosis.  I offered to refer her to another physician and she declined that.  We will schedule the surgery in the near future.    Angelia Mould. Derrell Lolling, M.D., [facility] General and Minimally invasive Surgery Breast and Colorectal Surgery Office:   [phone] Pager:   [phone] 11/09/2011 1:37 PM

## 2011-11-10 ENCOUNTER — Other Ambulatory Visit (INDEPENDENT_AMBULATORY_CARE_PROVIDER_SITE_OTHER): Payer: Self-pay | Admitting: General Surgery

## 2011-11-10 ENCOUNTER — Encounter (HOSPITAL_COMMUNITY): Payer: Self-pay | Admitting: Anesthesiology

## 2011-11-10 ENCOUNTER — Encounter (HOSPITAL_COMMUNITY)
Admission: RE | Disposition: A | Discharge status: Home / Self Care | Payer: Self-pay | Source: Ambulatory Visit | Attending: General Surgery

## 2011-11-10 ENCOUNTER — Encounter (HOSPITAL_COMMUNITY): Payer: Self-pay | Admitting: *Deleted

## 2011-11-10 ENCOUNTER — Ambulatory Visit (HOSPITAL_COMMUNITY): Payer: BC Managed Care – PPO | Admitting: Anesthesiology

## 2011-11-10 ENCOUNTER — Ambulatory Visit (HOSPITAL_COMMUNITY)
Admission: RE | Admit: 2011-11-10 | Discharge: 2011-11-10 | Disposition: A | Discharge status: Home / Self Care | Payer: BC Managed Care – PPO | Source: Ambulatory Visit | Attending: General Surgery | Admitting: General Surgery

## 2011-11-10 DIAGNOSIS — Z90710 Acquired absence of both cervix and uterus: Secondary | ICD-10-CM | POA: Insufficient documentation

## 2011-11-10 DIAGNOSIS — K624 Stenosis of anus and rectum: Secondary | ICD-10-CM | POA: Insufficient documentation

## 2011-11-10 DIAGNOSIS — C211 Malignant neoplasm of anal canal: Secondary | ICD-10-CM | POA: Insufficient documentation

## 2011-11-10 DIAGNOSIS — Z01812 Encounter for preprocedural laboratory examination: Secondary | ICD-10-CM | POA: Insufficient documentation

## 2011-11-10 DIAGNOSIS — C44529 Squamous cell carcinoma of skin of other part of trunk: Secondary | ICD-10-CM

## 2011-11-10 DIAGNOSIS — C52 Malignant neoplasm of vagina: Secondary | ICD-10-CM

## 2011-11-10 DIAGNOSIS — Z79899 Other long term (current) drug therapy: Secondary | ICD-10-CM | POA: Insufficient documentation

## 2011-11-10 DIAGNOSIS — K648 Other hemorrhoids: Secondary | ICD-10-CM | POA: Insufficient documentation

## 2011-11-10 DIAGNOSIS — E785 Hyperlipidemia, unspecified: Secondary | ICD-10-CM | POA: Insufficient documentation

## 2011-11-10 SURGERY — EXAM UNDER ANESTHESIA WITH HEMORRHOIDECTOMY AND PROCTOSCOPY
Anesthesia: General | Site: Anus | Wound class: Clean Contaminated

## 2011-11-10 MED ORDER — PROPOFOL 10 MG/ML IV EMUL
INTRAVENOUS | Status: DC | PRN
  Administered 2011-11-10: 180 mg via INTRAVENOUS

## 2011-11-10 MED ORDER — ACETAMINOPHEN 10 MG/ML IV SOLN
INTRAVENOUS | Status: AC
  Filled 2011-11-10: qty 100

## 2011-11-10 MED ORDER — HYDROMORPHONE HCL PF 1 MG/ML IJ SOLN: 0.2500 mg | INTRAMUSCULAR | Status: DC | PRN

## 2011-11-10 MED ORDER — DEXTROSE 5 % IV SOLN
1.0000 g | INTRAVENOUS | Status: AC
  Administered 2011-11-10: 1 g via INTRAVENOUS

## 2011-11-10 MED ORDER — BUPIVACAINE LIPOSOME 1.3 % IJ SUSP
20.0000 mL | INTRAMUSCULAR | Status: AC
  Administered 2011-11-10: 20 mL
  Filled 2011-11-10: qty 20

## 2011-11-10 MED ORDER — ONDANSETRON HCL 4 MG/2ML IJ SOLN
INTRAMUSCULAR | Status: DC | PRN
  Administered 2011-11-10: 4 mg via INTRAVENOUS

## 2011-11-10 MED ORDER — OXYCODONE HCL 5 MG PO TABS: 5.0000 mg | ORAL_TABLET | ORAL | Status: DC | PRN

## 2011-11-10 MED ORDER — PROMETHAZINE HCL 25 MG/ML IJ SOLN: 6.2500 mg | INTRAMUSCULAR | Status: DC | PRN

## 2011-11-10 MED ORDER — LIDOCAINE HCL 1 % IJ SOLN
INTRAMUSCULAR | Status: DC | PRN
  Administered 2011-11-10: 50 mg via INTRADERMAL

## 2011-11-10 MED ORDER — LACTATED RINGERS IV SOLN: INTRAVENOUS | Status: DC

## 2011-11-10 MED ORDER — ACETAMINOPHEN 10 MG/ML IV SOLN
INTRAVENOUS | Status: DC | PRN
  Administered 2011-11-10: 1000 mg via INTRAVENOUS

## 2011-11-10 MED ORDER — FENTANYL CITRATE 0.05 MG/ML IJ SOLN
INTRAMUSCULAR | Status: DC | PRN
  Administered 2011-11-10 (×2): 50 ug via INTRAVENOUS

## 2011-11-10 MED ORDER — MEPERIDINE HCL 50 MG/ML IJ SOLN: 6.2500 mg | INTRAMUSCULAR | Status: DC | PRN

## 2011-11-10 MED ORDER — CEFOXITIN SODIUM-DEXTROSE 1-4 GM-% IV SOLR (PREMIX)
INTRAVENOUS | Status: AC
  Filled 2011-11-10: qty 50

## 2011-11-10 MED ORDER — HYDROCODONE-ACETAMINOPHEN 5-325 MG PO TABS: 1.0000 | ORAL_TABLET | ORAL | Status: DC | PRN

## 2011-11-10 MED ORDER — LACTATED RINGERS IV SOLN
INTRAVENOUS | Status: DC | PRN
  Administered 2011-11-10: 12:00:00 via INTRAVENOUS

## 2011-11-10 SURGICAL SUPPLY — 35 items
BLADE HEX COATED 2.75 (ELECTRODE) ×2 IMPLANT
BLADE SURG 15 STRL LF DISP TIS (BLADE) ×1 IMPLANT
BLADE SURG 15 STRL SS (BLADE) ×2
BRIEF STRETCH FOR OB PAD LRG (UNDERPADS AND DIAPERS) IMPLANT
CANISTER SUCTION 2500CC (MISCELLANEOUS) ×2 IMPLANT
CLOTH BEACON ORANGE TIMEOUT ST (SAFETY) ×2 IMPLANT
COVER MAYO STAND STRL (DRAPES) IMPLANT
DECANTER SPIKE VIAL GLASS SM (MISCELLANEOUS) ×2 IMPLANT
DRAPE LG THREE QUARTER DISP (DRAPES) ×2 IMPLANT
DRSG PAD ABDOMINAL 8X10 ST (GAUZE/BANDAGES/DRESSINGS) ×2 IMPLANT
ELECT REM PT RETURN 9FT ADLT (ELECTROSURGICAL) ×2
ELECTRODE REM PT RTRN 9FT ADLT (ELECTROSURGICAL) ×1 IMPLANT
GAUZE SPONGE 4X4 16PLY XRAY LF (GAUZE/BANDAGES/DRESSINGS) ×2 IMPLANT
GLOVE BIOGEL PI IND STRL 7.0 (GLOVE) ×1 IMPLANT
GLOVE BIOGEL PI INDICATOR 7.0 (GLOVE) ×1
GLOVE EUDERMIC 7 POWDERFREE (GLOVE) ×2 IMPLANT
GOWN STRL NON-REIN LRG LVL3 (GOWN DISPOSABLE) ×2 IMPLANT
GOWN STRL REIN XL XLG (GOWN DISPOSABLE) ×4 IMPLANT
HEMOSTAT SNOW SURGICEL 2X4 (HEMOSTASIS) IMPLANT
KIT BASIN OR (CUSTOM PROCEDURE TRAY) ×2 IMPLANT
LUBRICANT JELLY K Y 4OZ (MISCELLANEOUS) ×2 IMPLANT
NDL SAFETY ECLIPSE 18X1.5 (NEEDLE) IMPLANT
NEEDLE HYPO 18GX1.5 SHARP (NEEDLE)
NEEDLE HYPO 25X1 1.5 SAFETY (NEEDLE) ×2 IMPLANT
NS IRRIG 1000ML POUR BTL (IV SOLUTION) ×2 IMPLANT
PACK LITHOTOMY IV (CUSTOM PROCEDURE TRAY) ×2 IMPLANT
PENCIL BUTTON HOLSTER BLD 10FT (ELECTRODE) ×2 IMPLANT
SPONGE GAUZE 4X4 12PLY (GAUZE/BANDAGES/DRESSINGS) ×2 IMPLANT
SPONGE SURGIFOAM ABS GEL 100 (HEMOSTASIS) ×4 IMPLANT
SUT CHROMIC 2 0 SH (SUTURE) IMPLANT
SUT CHROMIC 3 0 SH 27 (SUTURE) IMPLANT
SUT VIC AB 2-0 SH 18 (SUTURE) ×2 IMPLANT
SYR CONTROL 10ML LL (SYRINGE) ×2 IMPLANT
TOWEL OR 17X26 10 PK STRL BLUE (TOWEL DISPOSABLE) ×2 IMPLANT
YANKAUER SUCT BULB TIP 10FT TU (MISCELLANEOUS) ×2 IMPLANT

## 2011-11-10 NOTE — Anesthesia Postprocedure Evaluation (Signed)
  Anesthesia Post-op Note  [name]  Procedure(s) Performed:  EXAM UNDER ANESTHESIA WITH HEMORRHOIDECTOMY AND PROCTOSCOPY - rigid proctoscopy,biopsy of vaginal wall and rectum  Patient Location: PACU  Anesthesia Type: General  Level of Consciousness: awake and alert   Airway and Oxygen Therapy: Patient Spontanous Breathing  Post-op Pain: mild  Post-op Assessment: Post-op Vital signs reviewed, Patient's Cardiovascular Status Stable, Respiratory Function Stable, Patent Airway and No signs of Nausea or vomiting  Post-op Vital Signs: stable  Complications: No apparent anesthesia complications

## 2011-11-10 NOTE — Anesthesia Procedure Notes (Signed)
Procedure Name: LMA Insertion Date/Time: 11/10/2011 12:35 PM Performed by: [place], Stepahnie Campo C Pre-anesthesia Checklist: Patient identified, Timeout performed, Emergency Drugs available, Suction available and Patient being monitored Patient Re-evaluated:Patient Re-evaluated prior to inductionOxygen Delivery Method: Circle System Utilized Preoxygenation: Pre-oxygenation with 100% oxygen Intubation Type: IV induction LMA: LMA inserted LMA Size: 4.0 Number of attempts: 1 Placement Confirmation: breath sounds checked- equal and bilateral,  positive ETCO2 and CO2 detector Tube secured with: Tape Dental Injury: Teeth and Oropharynx as per pre-operative assessment

## 2011-11-10 NOTE — Interval H&P Note (Signed)
History and Physical Interval Note:  11/10/2011 12:19 PM  [name]  has presented today for surgery, with the diagnosis of bleeding hemorrhoids  The goals of treatment and the various methods of treatment have been discussed with the patient and family. After consideration of risks, benefits and other options for treatment, the patient has consented to  Procedure(s): EXAM UNDER ANESTHESIA, ANAL DILITATION,  WITH HEMORRHOIDECTOMY AND PROCTOSCOPY as a surgical intervention .  The patients' history has been reviewed today, patient examined today,  There is no change in status, she is stable for surgery.  I have reviewed the patients' chart and labs.  Questions were answered to the patient's satisfaction.     [name] 11/10/2011 12:20 PM

## 2011-11-10 NOTE — Anesthesia Preprocedure Evaluation (Addendum)
Anesthesia Evaluation  Patient identified by MRN, date of birth, ID band Patient awake    Reviewed: Allergy & Precautions, H&P , NPO status , Patient's Chart, lab work & pertinent test results  Airway Mallampati: II TM Distance: >3 FB Neck ROM: Full    Dental No notable dental hx.    Pulmonary neg pulmonary ROS,  clear to auscultation  Pulmonary exam normal       Cardiovascular neg cardio ROS Regular Normal    Neuro/Psych PSYCHIATRIC DISORDERS Anxiety Depression Negative Neurological ROS  Negative Psych ROS   GI/Hepatic negative GI ROS, Neg liver ROS,   Endo/Other  Negative Endocrine ROS  Renal/GU negative Renal ROS  Genitourinary negative   Musculoskeletal negative musculoskeletal ROS (+)   Abdominal   Peds negative pediatric ROS (+)  Hematology negative hematology ROS (+)   Anesthesia Other Findings   Reproductive/Obstetrics negative OB ROS                           Anesthesia Physical Anesthesia Plan  ASA: I  Anesthesia Plan: General   Post-op Pain Management:    Induction: Intravenous  Airway Management Planned: LMA  Additional Equipment:   Intra-op Plan:   Post-operative Plan: Extubation in OR  Informed Consent: I have reviewed the patients History and Physical, chart, labs and discussed the procedure including the risks, benefits and alternatives for the proposed anesthesia with the patient or authorized representative who has indicated his/her understanding and acceptance.   Dental advisory given  Plan Discussed with: CRNA  Anesthesia Plan Comments:        Anesthesia Quick Evaluation

## 2011-11-10 NOTE — Transfer of Care (Signed)
Immediate Anesthesia Transfer of Care Note  [name]  Procedure(s) Performed:  EXAM UNDER ANESTHESIA WITH HEMORRHOIDECTOMY AND PROCTOSCOPY - rigid proctoscopy, anal dilitation, hemorrhoidectomy  Patient Location: PACU  Anesthesia Type: General  Level of Consciousness: awake and alert   Airway & Oxygen Therapy: Patient Spontanous Breathing and Patient connected to face mask oxygen  Post-op Assessment: Report given to PACU RN and Post -op Vital signs reviewed and stable  Post vital signs: Reviewed and stable  Complications: No apparent anesthesia complications

## 2011-11-10 NOTE — Op Note (Signed)
Patient Name:           [name]   Date of Surgery:        11/10/2011  Pre op Diagnosis:      Anal stenosis, internal hemorrhoids  Post op Diagnosis:    Anterior rectal wall mass involving posterior vaginal wall, neoplastic process is suspected  Procedure:                 Examination under anesthesia, rigid proctoscopy, biopsy anterior rectal wall mass, biopsy posterior vaginal wall mass.  Surgeon:                     Angelia Mould. Derrell Lolling, M.D., FACS  Assistant:                      none  Operative Indications:   This is a 66 year old Caucasian female who has a history of a hemorrhoidectomy many years ago. She states she had a colonoscopy with [facility] 3 years ago and was told that was normal. She was seen in our office in November 2012 at which time she had minor external hemorrhoids and some internal hemorrhoids and a mild case of anal stenosis. We injected the hemorrhoids if she continues to have bleeding. She states that her bowel movements are brown in color. She has a little but of tenesmus. On exam she has mild anal stenosis and her hemorrhoids feel quite firm. She is brought to the operating room for examination under anesthesia to determine the source of her bleeding and in an attempt to clarify her diagnosis.  Operative Findings:       The patient had a firm mass involving the anterior wall of the very distal rectum and as well this mass appeared to be bulging into the posterior wall of the very distal vagina. She had some trivial external hemorrhoids which I do not think are causing her symptoms. I am concerned that this is a neoplastic mass. Biopsies were taken of the anterior rectal wall ulcer and the posterior vaginal wall bulging mass. Proctoscopy was performed to 18 cm, but I did not see any abnormality of the mucosa above the primary mass in the distal rectum.  Procedure in Detail:          Following the induction of general endotracheal anesthesia the patient was placed in a  lithotomy position in rigid stirrups. The perianal and vaginal areas were prepped and draped in a sterile fashion. Surgical time out was held. Intravenous antibiotics were given.  I examined the external rectum and anus as well as well as the vaginal introitus. The findings were as described above. With bimanual palpation I felt there was a firm mass involving the rectovaginal septum. By palpation it appeared to be slightly ulcerated in on the rectal side.  I injected 50% exparel, total of 20 cc in to the peri-rectal sphincters.  I first took a knife and took a conservative wedge biopsy of the bulging mass in the posterior vaginal wall. This was sent as a separate specimen. The base of the biopsy looked neoplastic. This was cauterized and we had  good hemostasis. A small piece of Surgicel was placed there. I felt no other masses in the vagina.  I tried to slowly dilate the anal canal but could only dilate up to 2 fingers. I could not get the large operating anoscope in. I placed a proctoscope in and proctoscoped her to up to 18 cm., but  could not go further due to angulation of the rectosigmoid. I was able to get a look anteriorly and what looked like an ulcerated mass in the very distal rectum anteriorly. I took a biopsy through the proctoscope. I then removed the proctoscope and placed a duckbill anoscope in place and lifted up anterior wall of the anus. I then took another biopsy with the biopsy forceps and sent the 2 rectal biopsies separately. Hemostasis was excellent. I put a piece of Gelfoam up in the anal canal. Checked the vagina and found no bleeding. External bandages were placed. Fishnet panties were placed. Patient tolerated procedure well without complications. EBL 20 cc. Counts correct.     Angelia Mould. Derrell Lolling, M.D., FACS General and Minimally Invasive Surgery Breast and Colorectal Surgery  11/10/2011 1:18 PM

## 2011-11-11 ENCOUNTER — Telehealth (INDEPENDENT_AMBULATORY_CARE_PROVIDER_SITE_OTHER): Payer: Self-pay | Admitting: General Surgery

## 2011-11-11 ENCOUNTER — Telehealth (INDEPENDENT_AMBULATORY_CARE_PROVIDER_SITE_OTHER): Payer: Self-pay

## 2011-11-11 ENCOUNTER — Other Ambulatory Visit (INDEPENDENT_AMBULATORY_CARE_PROVIDER_SITE_OTHER): Payer: Self-pay

## 2011-11-11 DIAGNOSIS — C210 Malignant neoplasm of anus, unspecified: Secondary | ICD-10-CM

## 2011-11-11 NOTE — Telephone Encounter (Signed)
Pt notified of ct appt,rad onc appt, Dr Derrell Lolling appt and pt aware [facility] to call her with appt with Dr Truett Perna.

## 2011-11-11 NOTE — Telephone Encounter (Signed)
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 This was discussed with Dr. Jimmy Picket.  I called the patient at this time and informed over her of her diagnosis. She will be scheduled for GI evaluation and colonoscopy with [facility]. She will be scheduled for CT scan of abdomen and pelvis. She'll be referred to medical oncology and radiation oncology.  She had numerous questions which I answered to the best of my ability. She will followup with me in the office for a postop visit.   Angelia Mould. Derrell Lolling, M.D., [facility] General and Minimally invasive Surgery Breast and Colorectal Surgery Office:   [phone] Pager:   [phone]  11/11/2011 11:19 AM

## 2011-11-12 ENCOUNTER — Telehealth: Payer: Self-pay | Admitting: Oncology

## 2011-11-12 NOTE — Telephone Encounter (Signed)
lmonvm adviisng the pt of her new pt appt on 11/15/2011 @3 :30pm with dr Truett Perna and for her to call me back to confirm the message to see if she can make the appt.

## 2011-11-13 ENCOUNTER — Telehealth: Payer: Self-pay | Admitting: Oncology

## 2011-11-13 NOTE — Telephone Encounter (Signed)
S/w the pt and she has agreed to come in on Monday for her new pt appt with dr Gaylyn Rong

## 2011-11-15 ENCOUNTER — Ambulatory Visit
Admission: RE | Admit: 2011-11-15 | Discharge: 2011-11-15 | Disposition: A | Discharge status: Home / Self Care | Payer: BC Managed Care – PPO | Source: Ambulatory Visit | Attending: General Surgery | Admitting: General Surgery

## 2011-11-15 ENCOUNTER — Telehealth: Payer: Self-pay | Admitting: Oncology

## 2011-11-15 ENCOUNTER — Encounter: Payer: BC Managed Care – PPO | Admitting: Oncology

## 2011-11-15 ENCOUNTER — Other Ambulatory Visit: Payer: BC Managed Care – PPO | Admitting: Lab

## 2011-11-15 ENCOUNTER — Encounter: Payer: Self-pay | Admitting: Radiation Oncology

## 2011-11-15 ENCOUNTER — Ambulatory Visit (HOSPITAL_BASED_OUTPATIENT_CLINIC_OR_DEPARTMENT_OTHER): Payer: BC Managed Care – PPO | Admitting: Oncology

## 2011-11-15 VITALS — BP 152/83 | HR 75 | Temp 98.5°F | Ht 63.0 in | Wt 148.4 lb

## 2011-11-15 DIAGNOSIS — C210 Malignant neoplasm of anus, unspecified: Secondary | ICD-10-CM

## 2011-11-15 DIAGNOSIS — C211 Malignant neoplasm of anal canal: Secondary | ICD-10-CM | POA: Insufficient documentation

## 2011-11-15 IMAGING — CT CT ABD-PELV W/ CM
3 of 5 series · 12 of 36 positions shown, 18 images · IV contrast (READICAT/WATER & [ID] OMNI 300)
Comparison: Lumbar spine radiographs [DATE]

CLINICAL DATA: Newly-diagnosed squamous cell carcinoma of the anus.

CT ABDOMEN AND PELVIS WITH CONTRAST
TECHNIQUE: Multidetector CT imaging of the abdomen and pelvis was
performed following the standard protocol during bolus
administration of intravenous contrast.
Contrast: 100mL OMNIPAQUE IOHEXOL 300 MG/ML IV SOLN

[Series 3: abd/pelvis with · axial · 0.75mm/px · z∈[-320,-14]mm · 7 of 83 slices shown, 12 images]
[im 11/83  soft-tissue]
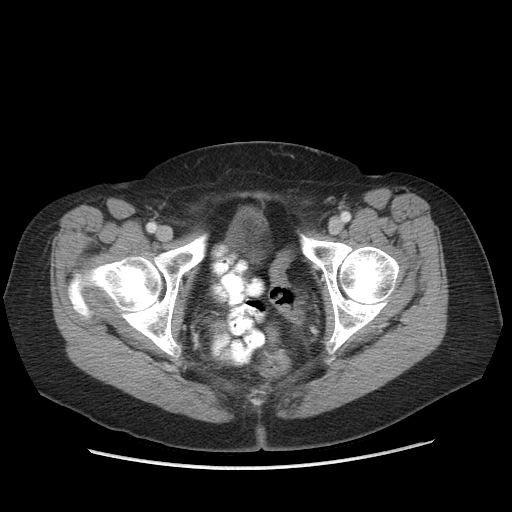
[im 11/83  bone]
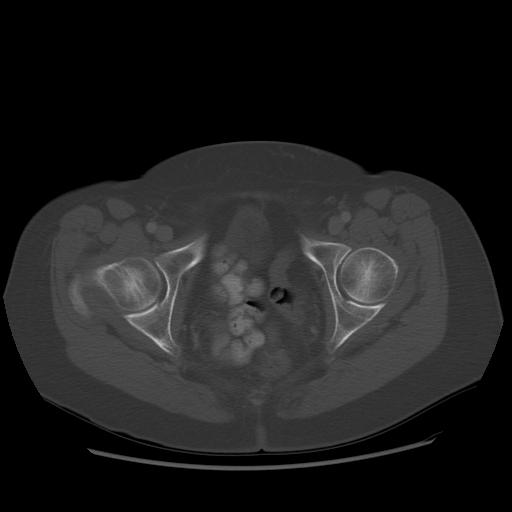
[im 21/83  soft-tissue]
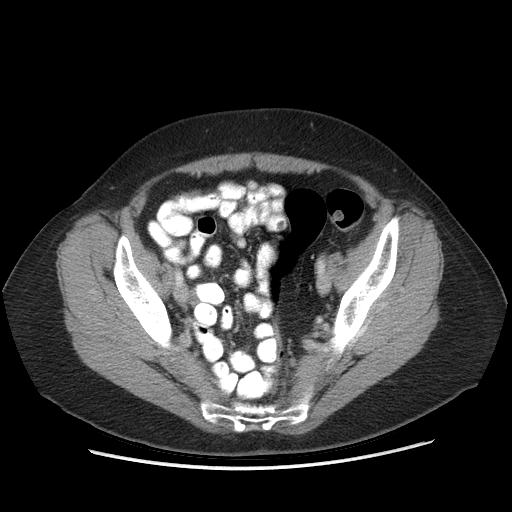
[im 31/83  soft-tissue]
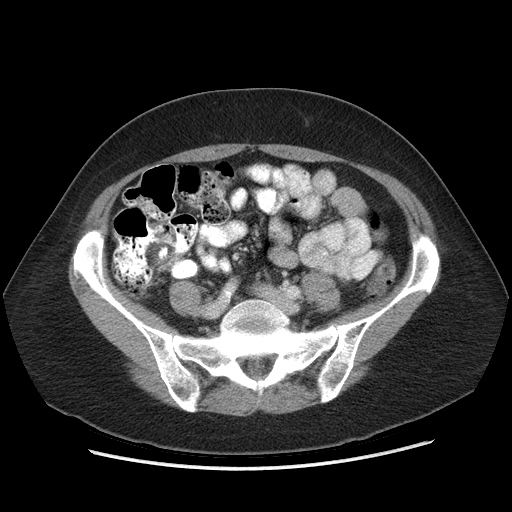
[im 42/83  soft-tissue]
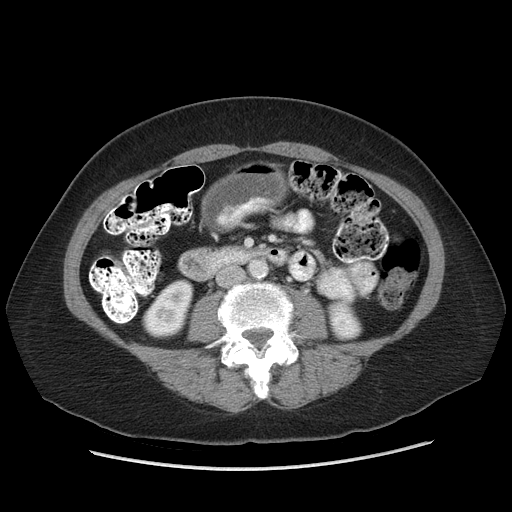
[im 42/83  lung]
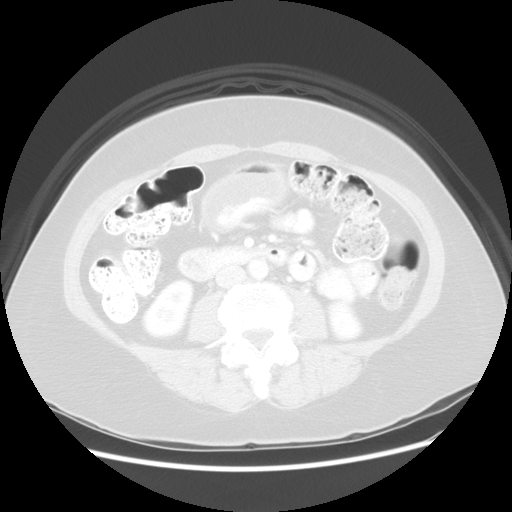
[im 52/83  soft-tissue]
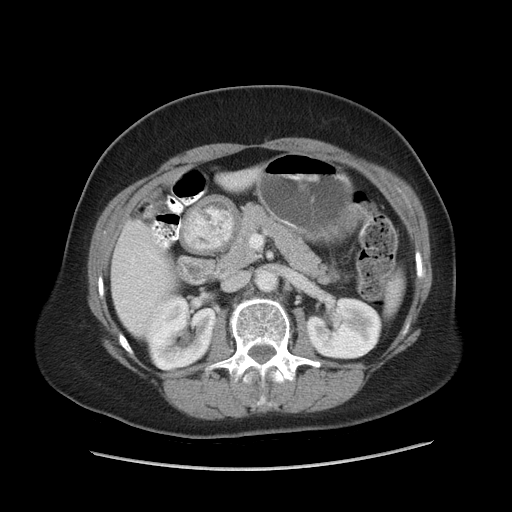
[im 52/83  lung]
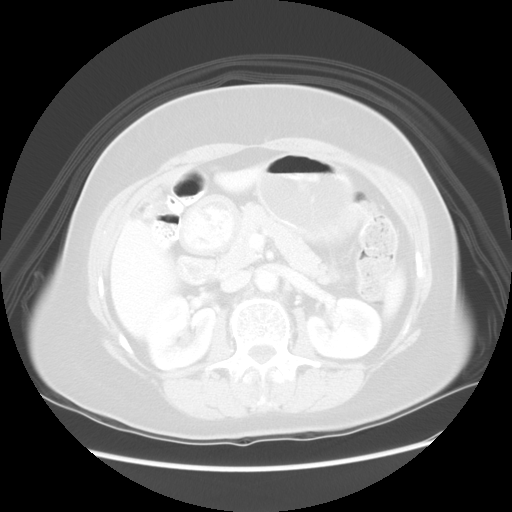
[im 62/83  soft-tissue]
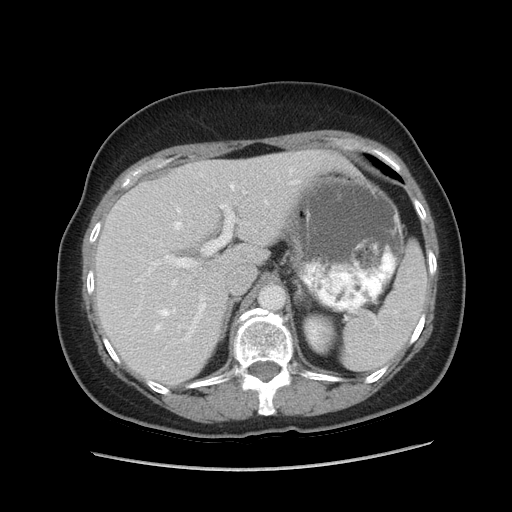
[im 62/83  lung]
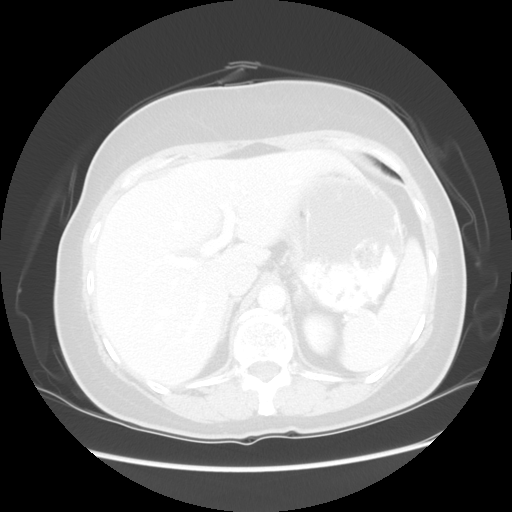
[im 72/83  soft-tissue]
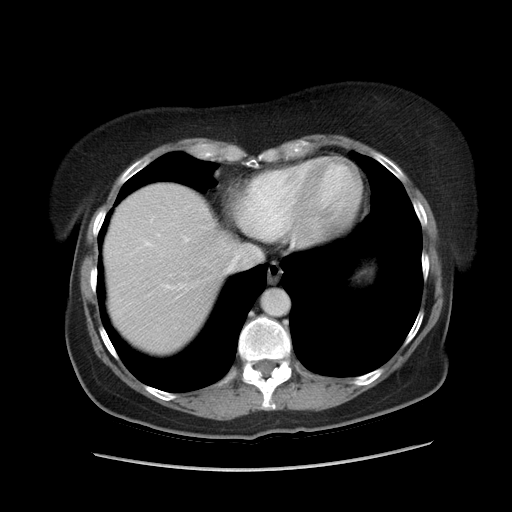
[im 72/83  lung]
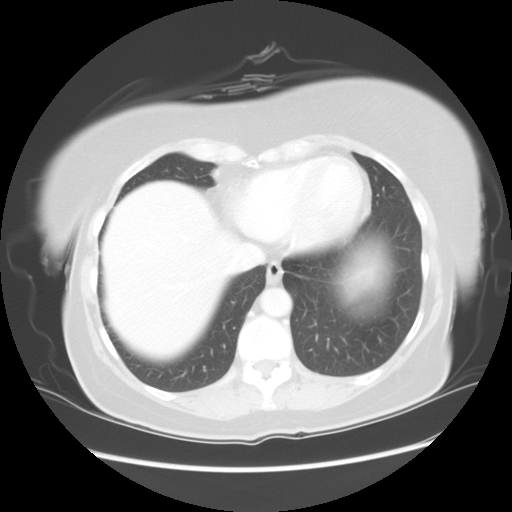

[Series 601: coronal body · coronal · 0.84mm/px · 1 of 118 slices shown, 2 images]
[im 40/118  soft-tissue]
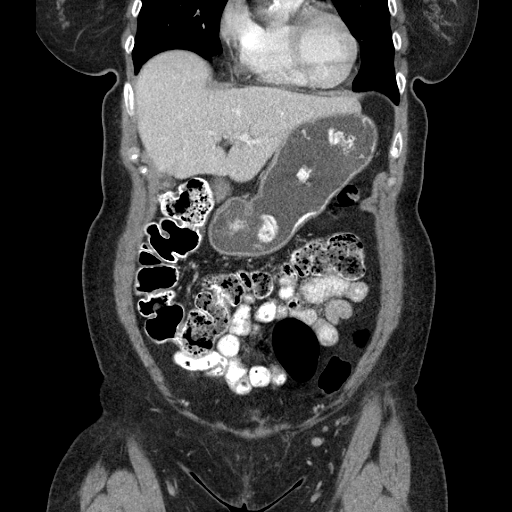
[im 40/118  bone]
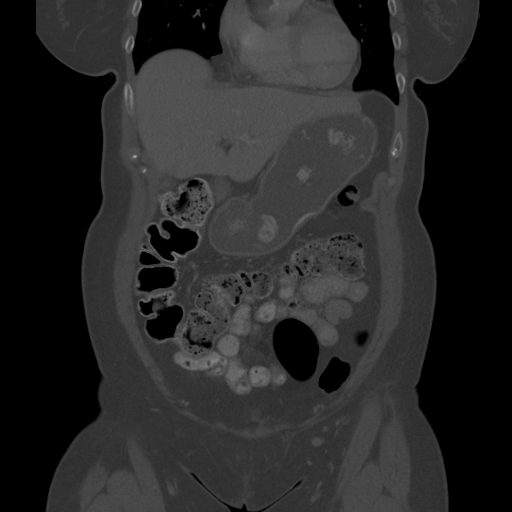

[Series 602: sagittal body · sagittal · 0.84mm/px · 4 of 153 slices shown]
[im 10/153  soft-tissue]
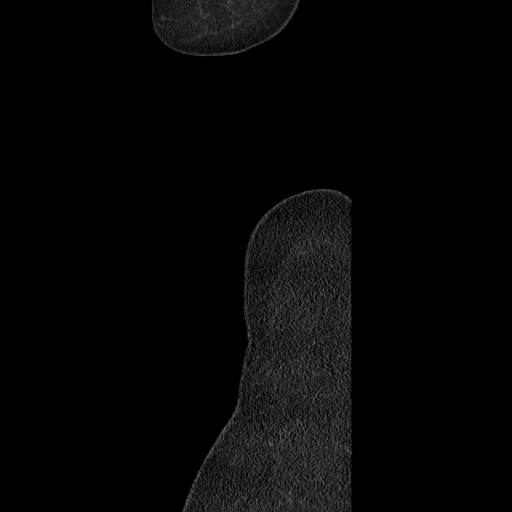
[im 29/153  soft-tissue]
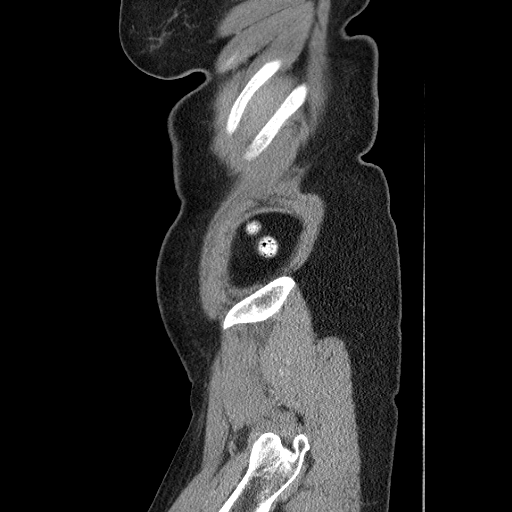
[im 48/153  soft-tissue]
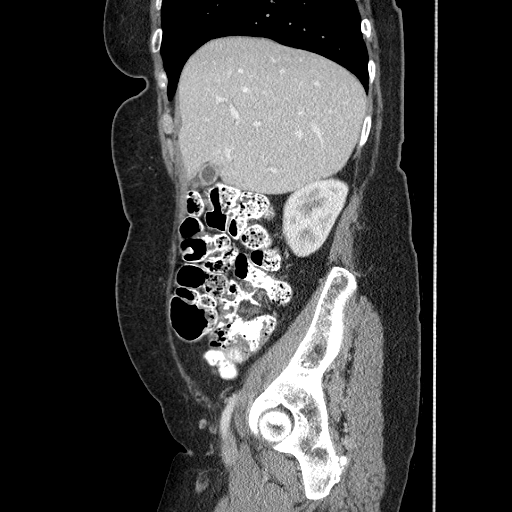
[im 67/153  soft-tissue]
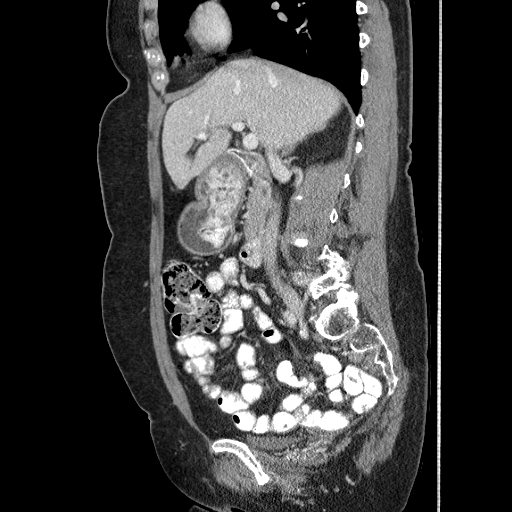

[12 of 36 positions shown; findings below may reference images not displayed]

FINDINGS: Interval development of compression fractures of L5, L3,
and L1 noted without bony retropulsion.  These are otherwise age
indeterminate.  Schmorl's node formation or less likely mild
superior endplate depression noted at T10 and T11.

No nodule is identified at the lung bases.  No pleural effusion.
Nonobstructive right lower renal pole 3 mm calculus identified
image 37.  No hydronephrosis.  No radiopaque left renal calculus.
Liver, adrenal glands, left kidney, spleen, and pancreas are
normal.  Atherosclerotic aortic calcification noted without
aneurysm.  No lymphadenopathy, free air, or free fluid.

Uterus presumed surgically absent.  Left ovary is not visualized.
Right ovary thought to be visualized on image 67, unremarkable.
Bladder is normal.  Small bowel is normal.  The appendix not
visualized but there is no secondary evidence for acute
appendicitis.  The colon is unremarkable.  There is a small amount
gas within the lower vagina.  Fat planes between the adjacent
rectum and vaginal wall are not well visualized due to CT
technique.  There is no oral contrast present within the rectum at
this level. There is a 5 mm perirectal lymph node identified in the
presacral space on image 65.  Other similar appearing presacral
perirectal nodes are also identified on images 69 and 70, 58, and
image 54 at the level of the iliac bifurcation.  There is soft
tissue fullness at the level of the anus but tissue planes are not
well evaluated with this technique.  No inguinal lymphadenopathy.

No lytic or sclerotic osseous lesion to suggest bony metastasis.
IMPRESSION: Soft tissue prominence at the level of the anus which could
represent the reported squamous cell cancer is not well evaluated
with this technique.  Perirectal/presacral lymph nodes measuring
less than 5 mm are noted, which could be inflammatory but are
suspicious for metastatic disease in the presence of a known
squamous cell cancer.

Gas within the vagina which may have been passed retrograde,
although a fistulous connection is not excluded.

MRI may be helpful with contrast for further evaluation.

Interval development of lumbar spine compression fractures. Per CMS
PQRS reporting requirements (PQRS Measure 24): Given the patient's
age of greater than 50 and the fracture site (hip, distal radius,
or spine), the patient should be tested for osteoporosis using DXA,
and the appropriate treatment considered based on the DXA results.

## 2011-11-15 MED ORDER — IOHEXOL 300 MG/ML  SOLN
100.0000 mL | Freq: Once | INTRAMUSCULAR | Status: AC | PRN
  Administered 2011-11-15: 100 mL via INTRAVENOUS

## 2011-11-15 NOTE — Telephone Encounter (Signed)
appts made and printed for 1/17 chemo teaching,1/22 for pet,2/1 for pump d/c and 2/8 for lisa. To call pt with tx date and picc date.

## 2011-11-15 NOTE — Progress Notes (Signed)
Referring MD: Ane Payment 66 y.o.  1946/09/16    Reason for Referral: New diagnosis of anal cancer     HPI: She developed "hemorrhoid "bleeding several months ago and saw Dr. Cliffton Asters. She was referred to Dr. Derrell Lolling and underwent injection of hemorrhoids. She continues to have bleeding. Dr. Derrell Lolling performed an examination under anesthesia and rigid proctoscopy on 11/10/2011. A firm mass was noted to involve the anterior wall of the distal rectum and the mass was noted to bulge into the posterior wall of the distal vagina. Trivial external hemorrhoids were noted. Biopsies were taken from the anterior rectal wall and posterior vaginal wall.  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  She continues to have bleeding. She denies pain.  Past Medical History  Diagnosis Date  . Depression   . Hyperlipidemia   . Vitamin D deficiency   . Anxiety   .  G1 P1      Past Surgical History  Procedure Date  . Abdominal hysterectomy 1978  . Hemorrhoid surgery 1980    Patient had to guess on the date.   . Bilateral tubal ligation at age 53  . Left wrist "cyst "surgery  Family History  Problem Relation Age of Onset  . Cancer Sister 74    breast  . Alcohol abuse Maternal Uncle    . A sister had a gynecologic cancer and is being treated with chemotherapy in [place] after undergoing a hysterectomy.  . She has 7 siblings. No other family history of cancer.  Current outpatient prescriptions:busPIRone (BUSPAR) 10 MG tablet, Take 10 mg by mouth 2 (two) times daily. , Disp: , Rfl: ;  DULoxetine (CYMBALTA) 60 MG capsule, Take 60 mg by mouth daily with breakfast. , Disp: , Rfl: ;  pravastatin (PRAVACHOL) 40 MG tablet, Take 40 mg by mouth daily with breakfast. , Disp: , Rfl:  No current facility-administered medications for this visit. Facility-Administered  Medications Ordered in Other Visits: iohexol (OMNIPAQUE) 300 MG/ML solution 100 mL, 100 mL, Intravenous, Once PRN, Medication Radiologist, 100 mL at 11/15/11 1446  Allergies: No Known Allergies  Social History: She lives alone in [place]. 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 She quit smoking cigarettes 20 years ago after smoking for approximately 20 years. She does not use alcohol. She has no transfusion history. She denies risk factors for HIV and hepatitis.   ROS:   Positives include: Intermittent hot flashes, 10 pound weight loss at the onset of rectal bleeding 3 months ago. No recent weight loss. Increased rectal bleeding, now wearing a pad.  A complete ROS was otherwise negative.  Physical Exam:  Blood pressure 152/83, pulse 75, temperature 98.5 F (36.9 C), temperature source Oral, height 5\' 3"  (1.6 m), weight 148 lb 6.4 oz (67.314 kg).  HEENT: Oropharynx without visible mass. Neck without mass. Upper and lower denture plate. Lungs: Clear bilaterally. Cardiac: Regular rate and rhythm Abdomen: No hepatosplenomegaly, no mass, nontender  Vascular: No leg edema Lymph nodes: No cervical, supraclavicular, axillary, inguinal, or femoral lymph nodes  Neurologic: Alert and oriented. The motor examination appears grossly intact. Skin: No rash Rectal: Immediately proximal to the anal verge there is firm stenotic circumferential tissue extending several centimeters into the anal canal. There is smooth firm mass like density at the distal posterior vaginal wall.   LAB:  CBC  Lab Results  Component Value Date   WBC 5.7 11/08/2011   HGB 11.0* 11/08/2011   HCT 33.4* 11/08/2011   MCV 91.3 11/08/2011   PLT 295 11/08/2011     CMP      Component Value Date/Time   NA 141 11/08/2011 1455   K 4.4 11/08/2011 1455   CL 104 11/08/2011 1455   CO2 30 11/08/2011 1455   GLUCOSE 98 11/08/2011 1455   BUN 12 11/08/2011 1455   CREATININE 0.66 11/08/2011 1455    CALCIUM 9.8 11/08/2011 1455   PROT 7.4 11/08/2011 1455   ALBUMIN 4.1 11/08/2011 1455   AST 20 11/08/2011 1455   ALT 14 11/08/2011 1455   ALKPHOS 103 11/08/2011 1455   BILITOT 0.2* 11/08/2011 1455   GFRNONAA >90 11/08/2011 1455   GFRAA >90 11/08/2011 1455     Radiology: A CT of the abdomen and pelvis on 11/14/2010-compared to lumbar spine x-rays from September 2008 there has been interval development of compression fractures at L5, L3, and L1. The liver, adrenal glands, and pancreas appear normal. No lymphadenopathy or free fluid. The: Is unremarkable. Small amount of gas within the lower vagina. Fat planes between the adjacent rectum and vaginal wall are not well-visualized the 5 mm perirectal lymph node in the presacral space. Other similar-appearing presacral perirectal lymph nodes are defined, also at the level of the iliac bifurcation. Soft tissue fullness at the level of the anus. No inguinal lymphadenopathy. No lytic or sclerotic lesions    Assessment/Plan:   1. Poorly differentiated carcinoma, basaloid squamous cell carcinoma, of the anal canal and posterior vagina  1 a.  staging CT scans of the abdomen and pelvis on 11/15/2011 with suspicious perirectal lymph nodes and no evidence of distant metastatic disease   Disposition:   She appears to have a locally advanced squamous cell carcinoma of the anus. I discussed the diagnosis, prognosis, and treatment options with the patient and her family. I discussed the case with Dr. Dayton Scrape. He also examined the patient today.  The plan is to proceed with combined chemotherapy and radiation. She will undergo a staging PET scan for radiation treatment planning. Doctors Dayton Scrape and Bucklin agree there is no clear indication for a colonoscopy at present. We will cancel the scheduled colonoscopy for January 16 and consider a surveillance colonoscopy at the completion of therapy.  I reviewed the potential toxicities associated with the 5 fluorouracil and mitomycin C.  chemotherapy regimen. She understands the potential for nausea, vomiting, mucositis, diarrhea, and hematologic toxicity. We also discussed the chance of alopecia, skin hyperpigmentation, the hand-foot syndrome, and a skin rash. We discussed the hemolytic uremic syndrome associated with mitomycin-C.  She will attend chemotherapy teaching class. We will ask radiology to place a PICC for the administration of chemotherapy. She is scheduled to begin systemic therapy concurrent with radiation on 11/29/2011 . She will return for an office visit and nadir CBC on 12/10/2011.  Pernell Dikes BRADLEY 11/15/2011, 4:49 PM

## 2011-11-15 NOTE — Progress Notes (Signed)
66 year old female.   Hx of hemorrhoidectomy many years ago. Normal colonoscopy 3 years ago at [facility]. Seen November 2012 by Dr. Derrell Lolling for minor external hemorrhoids, internal hemorrhoids, and mild anal stenosis. Anal dilation, with hemorrhoidectomy and proctoscopy done 11/10/2011 to determine source of bleeding and in an attempt to clarify her diagnosis. Biopsies of her anterior rectal wall mass and vaginal posterior wall mass revealed poorly differentiated carcinoma, consistent with basaloid squamous cell carcinoma. Dr. Derrell Lolling scheduled patient for GI evaluation and colonoscopy at [facility]. Also, he scheduled her for CT scan of abdomen and pelvis on 11/15/2011 at 2 pm.  NKDA No hx of radiation therapy No indication of a pacemaker noted

## 2011-11-15 NOTE — Progress Notes (Signed)
This encounter was created in error - please disregard.

## 2011-11-15 NOTE — Telephone Encounter (Signed)
per MD request, called pt and asked that she comes in today @ 3pm after her CT SCAN

## 2011-11-15 NOTE — Telephone Encounter (Signed)
Referred by Dr. Ileene Musa Dx- Anus Ca

## 2011-11-16 ENCOUNTER — Other Ambulatory Visit: Payer: Self-pay | Admitting: *Deleted

## 2011-11-16 ENCOUNTER — Ambulatory Visit
Admission: RE | Admit: 2011-11-16 | Discharge: 2011-11-16 | Disposition: A | Discharge status: Home / Self Care | Payer: BC Managed Care – PPO | Source: Ambulatory Visit | Attending: Radiation Oncology | Admitting: Radiation Oncology

## 2011-11-16 ENCOUNTER — Ambulatory Visit: Payer: BC Managed Care – PPO

## 2011-11-16 ENCOUNTER — Encounter: Payer: Self-pay | Admitting: *Deleted

## 2011-11-16 ENCOUNTER — Ambulatory Visit (HOSPITAL_COMMUNITY)
Admission: RE | Admit: 2011-11-16 | Discharge: 2011-11-16 | Disposition: A | Discharge status: Home / Self Care | Payer: BC Managed Care – PPO | Source: Ambulatory Visit | Attending: Radiation Oncology | Admitting: Radiation Oncology

## 2011-11-16 VITALS — BP 114/67 | HR 69 | Temp 97.7°F | Resp 20 | Ht 64.0 in | Wt 148.3 lb

## 2011-11-16 DIAGNOSIS — K1231 Oral mucositis (ulcerative) due to antineoplastic therapy: Secondary | ICD-10-CM | POA: Insufficient documentation

## 2011-11-16 DIAGNOSIS — C211 Malignant neoplasm of anal canal: Secondary | ICD-10-CM | POA: Insufficient documentation

## 2011-11-16 DIAGNOSIS — Y842 Radiological procedure and radiotherapy as the cause of abnormal reaction of the patient, or of later complication, without mention of misadventure at the time of the procedure: Secondary | ICD-10-CM | POA: Insufficient documentation

## 2011-11-16 DIAGNOSIS — Z87891 Personal history of nicotine dependence: Secondary | ICD-10-CM | POA: Insufficient documentation

## 2011-11-16 DIAGNOSIS — L988 Other specified disorders of the skin and subcutaneous tissue: Secondary | ICD-10-CM | POA: Insufficient documentation

## 2011-11-16 DIAGNOSIS — R911 Solitary pulmonary nodule: Secondary | ICD-10-CM | POA: Insufficient documentation

## 2011-11-16 DIAGNOSIS — Z8049 Family history of malignant neoplasm of other genital organs: Secondary | ICD-10-CM | POA: Insufficient documentation

## 2011-11-16 DIAGNOSIS — Z79899 Other long term (current) drug therapy: Secondary | ICD-10-CM | POA: Insufficient documentation

## 2011-11-16 DIAGNOSIS — E785 Hyperlipidemia, unspecified: Secondary | ICD-10-CM | POA: Insufficient documentation

## 2011-11-16 DIAGNOSIS — C210 Malignant neoplasm of anus, unspecified: Secondary | ICD-10-CM | POA: Insufficient documentation

## 2011-11-16 DIAGNOSIS — Z510 Encounter for antineoplastic radiation therapy: Secondary | ICD-10-CM | POA: Insufficient documentation

## 2011-11-16 DIAGNOSIS — L589 Radiodermatitis, unspecified: Secondary | ICD-10-CM | POA: Insufficient documentation

## 2011-11-16 IMAGING — CR DG CHEST 2V
2 series · 2 of 2 positions shown · non-contrast
Comparison: None.

CLINICAL DATA: Anal cancer.

CHEST - 2 VIEW

[w chest pa]
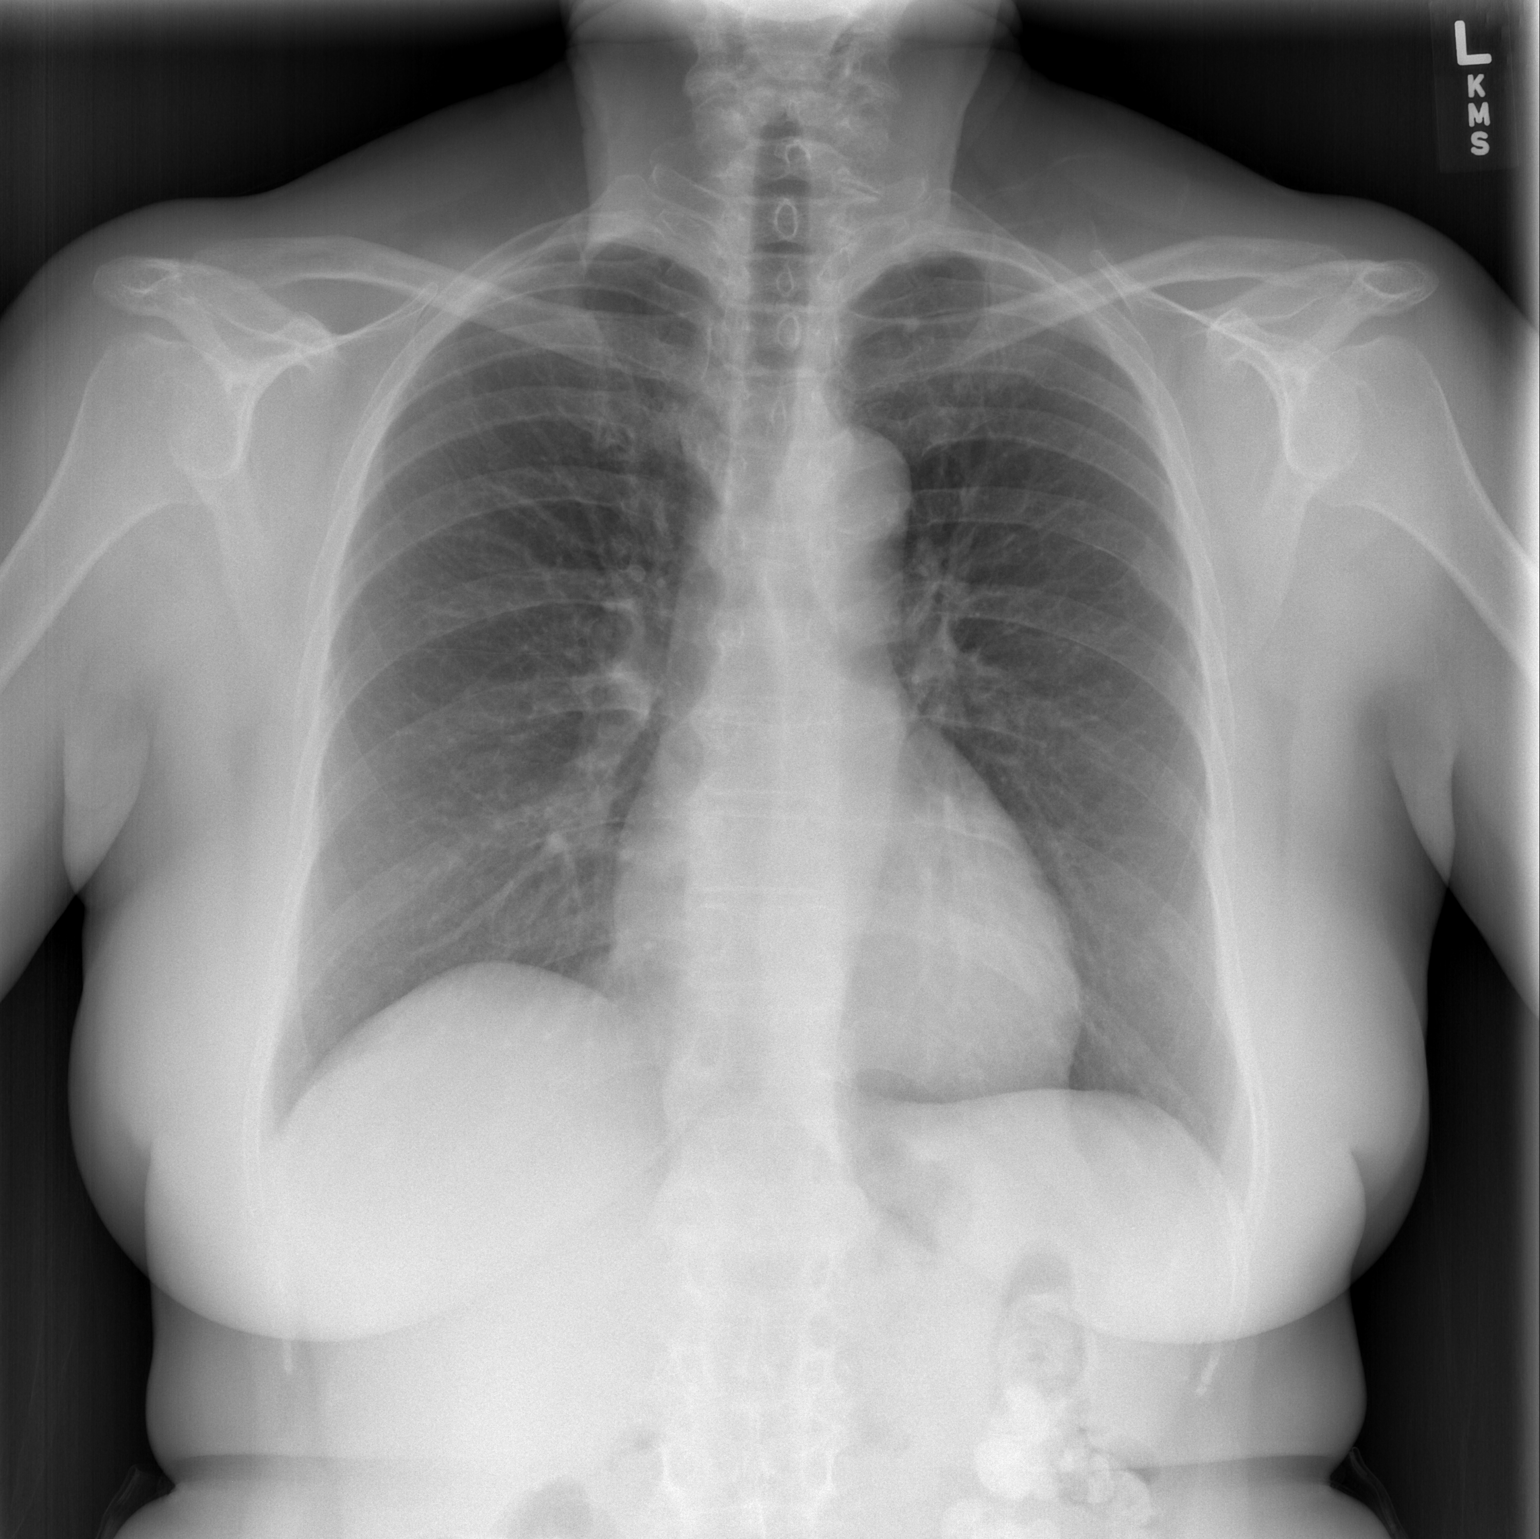

[w chest lat *]
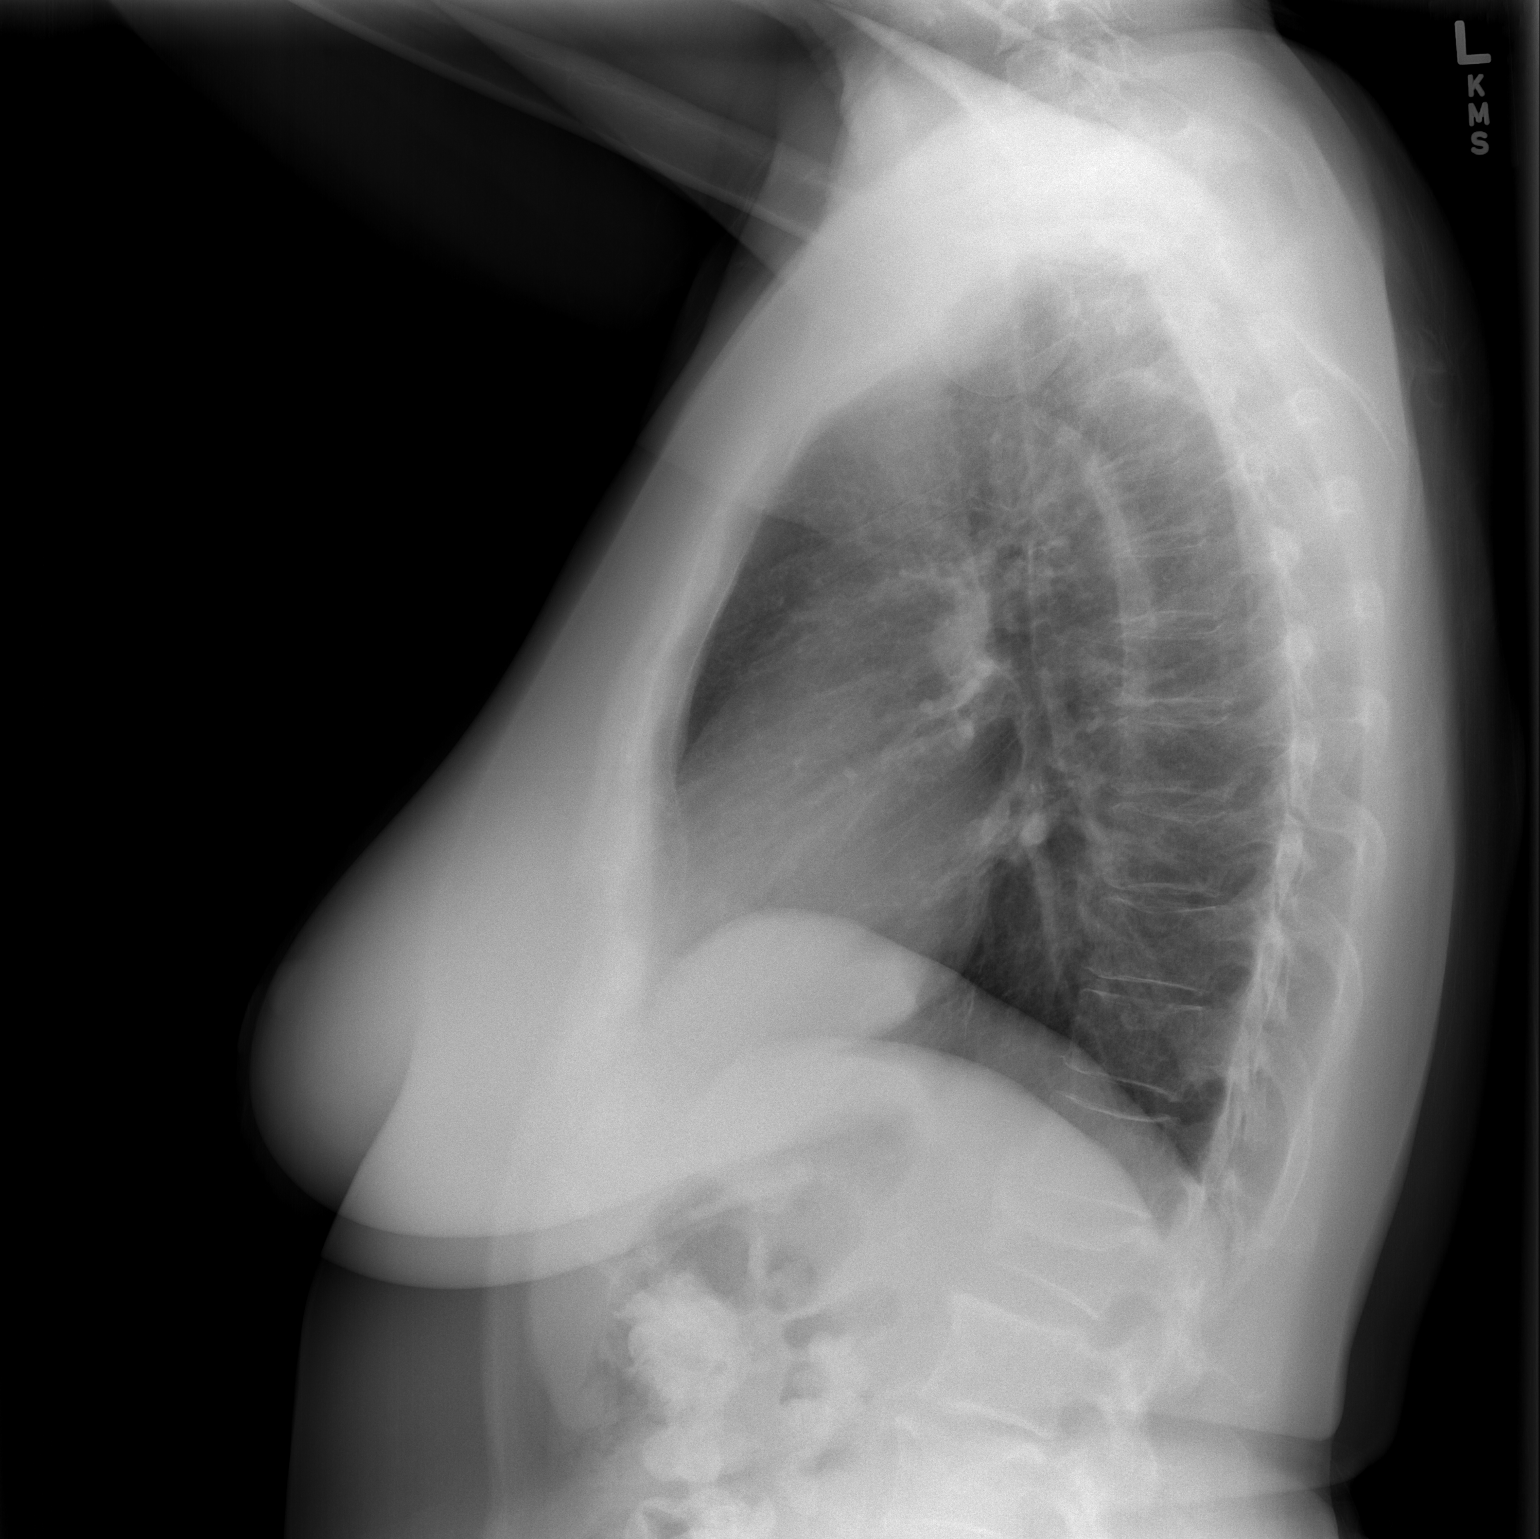

[2 of 2 positions shown; findings below may reference images not displayed]

FINDINGS: There is a 4 mm nodule in the left lung apex which is
relatively dense and probably represents a granuloma.  No prior
studies available for comparison.

The lungs are otherwise clear.  Heart size and vascularity are
normal.  No acute osseous abnormality.  Old compression fracture of
L1.
IMPRESSION: 4 mm nodule in the left lung apex, probably a granuloma.  Otherwise
benign-appearing chest.

## 2011-11-16 NOTE — Progress Notes (Signed)
Please see the Nurse Progress Note in the MD Initial Consult Encounter for this patient. 

## 2011-11-16 NOTE — Progress Notes (Signed)
Encounter addended by: Lowella Petties, RN on: 11/16/2011  3:13 PM<BR>     Documentation filed: Inpatient Patient Education

## 2011-11-16 NOTE — Progress Notes (Signed)
[facility] Radiation Oncology NEW PATIENT EVALUATION  Name: [name]: [id number]  Date: 11/16/2011  DOB: December 01, 1945  Status: outpatient   CC: Tiffany Bradford, MD, MD  Tiffany Mention, MD    REFERRING PHYSICIAN: [name], MD   DIAGNOSIS: Clinical stage IIIB (T4 NX MX) squamous cell carcinoma of the anal canal    HISTORY OF PRESENT ILLNESS:  [name] is a 66 y.o. female who is seen today through the courtesy of Dr. Derrell [name] and Dr. Truett [name] for consideration of radiation therapy in the management of her clinical stage IIIB squamous cell carcinoma of the anal canal. She presented with a 3-4 month history of "hemorrhoidal bleeding". Dr. Derrell [name] performed examination under anesthesia and rigid proctoscopy on 11/10/2011. He described a distal rectal/anal mass bulging into the posterior wall of the distal vagina. There will as anal stenosis. Proctoscopy was performed up to18 cm. 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 There were perirectal/presacral lymph nodes measuring less than 0.5 cm which could be inflammatory, but also suspicious for metastatic disease. Compression fracture of the lumbar spine were seen. Dr. Truett [name] has her scheduled for a PET scan on Tuesday, January 22. She describes a "pressure sensation" along her anus. She is not having significant discomfort. She is on a fiber stool softener. I performed a bimanual pelvic examination with Dr. Truett [name] when he saw her yesterday.     PREVIOUS RADIATION THERAPY: No   PAST MEDICAL HISTORY:  has a past medical history of Depression; Hyperlipidemia; Vitamin D deficiency; Anxiety; and Rectal bleeding.     PAST SURGICAL HISTORY:  Past Surgical History    Procedure Date  . Hemorrhoid surgery 1980    Patient had to guess on the date.  . Abdominal hysterectomy 1978    age 47  . Tubal ligation     b/l  age 52     FAMILY HISTORY: family history includes Alcohol abuse in her maternal uncle; Cancer in her sister; and Cervical cancer in her sister. Her father died of a heart attack at 21. Her mother died at 96, unknown cause.   SOCIAL HISTORY:  reports that she quit smoking about 24 years ago. Her smoking use included Cigarettes. She has a 12.5 pack-year smoking history. She has never used smokeless tobacco. She reports that she does not drink alcohol or use illicit drugs. Divorced over 26 years ago, 1 child, age 94 who works as a Veterinary surgeon. 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   ALLERGIES: Review of patient's allergies indicates no known allergies.   MEDICATIONS:  Current Outpatient Prescriptions  Medication Sig Dispense Refill  . busPIRone (BUSPAR) 10 MG tablet Take 10 mg by mouth 2 (two) times daily.       . DULoxetine (CYMBALTA) 60 MG capsule Take 60 mg by mouth daily with breakfast.       . pravastatin (PRAVACHOL) 40 MG tablet Take 40 mg by mouth daily with breakfast.        No current facility-administered medications for this encounter.   Facility-Administered Medications Ordered in Other Encounters  Medication Dose Route Frequency Provider Last Rate Last Dose  . iohexol (OMNIPAQUE) 300 MG/ML solution 100 mL  100 mL Intravenous Once  PRN Medication Radiologist   100 mL at 11/15/11 1446      REVIEW OF SYSTEMS:  Pertinent items are noted in HPI.    PHYSICAL EXAM:  height is 5\' 4"  (1.626 m) and weight is 148 lb 4.8 oz (67.268 kg). Her oral temperature is 97.7 F (36.5 C). Her blood pressure is 114/67 and her pulse is 69. Her respiration is 20.   Head and neck examination: Grossly unremarkable. Nodes: There is no cervical, supraclavicular, or inguinal lymphadenopathy. Chest lungs clear.  Back without spinal or CVA tenderness. Heart regular rate and rhythm. Abdomen soft without masses or organomegaly. The pelvic examination (performed on 11/15/2011) your a few external hemorrhoids. On rectovaginal examination there is a bulky anal mass which is circumferential along the distal anus, but primarily along the anterior anus/distal rectum extending over a distance of 4.5-5.0 cm with a width a 2.5-3.0 cm. The posterior wall of the vagina is intimately associated with the tumor although there is no discrete ulceration of the vaginal mucosa. Extremities without edema. Neurologic examination: Grossly nonfocal.    LABORATORY DATA:  Lab Results  Component Value Date   WBC 5.7 11/08/2011   HGB 11.0* 11/08/2011   HCT 33.4* 11/08/2011   MCV 91.3 11/08/2011   PLT 295 11/08/2011   Lab Results  Component Value Date   NA 141 11/08/2011   K 4.4 11/08/2011   CL 104 11/08/2011   CO2 30 11/08/2011   Lab Results  Component Value Date   ALT 14 11/08/2011   AST 20 11/08/2011   ALKPHOS 103 11/08/2011   BILITOT 0.2* 11/08/2011      IMPRESSION: Clinical stage IIIB (T4 NX MX) squamous cell carcinoma of the anal canal. Her PET scan early next week we'll complete her staging. Based on the vaginal biopsy she has T4 disease. I explained to the patient that the standard of care is to proceed with chemoradiation. She'll be treated with Tomotherapy/IMRT and be guided by her PET scan findings. She will receive 6 weeks of radiation therapy. I discussed the potential acute and late toxicities of radiation therapy and she wishes to proceed as outlined. Even though she is getting a PET scan next week for her staging I will go ahead and set up a chest x-ray today which can serve as a baseline for future followup surveillance. I assume that her anemia is from her rectal bleeding a, Dr. Truett [name] may start her on iron supplementation. I spoke with Dr. Truett [name] and we will begin her chemotherapy radiation on Monday, February 4.   PLAN:  As  discussed above. She will return for treatment planning on Wednesday, January 23 and we will begin her radiation therapy on Monday, February 4. Dr. Truett [name] may place her on iron supplementation.   I spent 60 minutes minutes face to face with the patient and more than 50% of that time was spent in counseling and/or coordination of care.

## 2011-11-16 NOTE — Progress Notes (Signed)
Per Dr. Truett Perna: Was notified by radiation oncologist that PET scan being done sooner than anticipated, so they will be able to get her on treatment for 11/29/11 again as original plan. Forwarded orders to scheduler to REschedule PICC line and chemo again.

## 2011-11-16 NOTE — Progress Notes (Signed)
Per radiation oncologist, will not be able to begin treatment until 12/06/11. Need to move PICC placement and chemo start date to 12/06/11. Move follow up visit with midlevel to 1 week after 12/10/11 appointment. Order to scheduler.

## 2011-11-16 NOTE — Progress Notes (Signed)
Encounter addended by: Ardell Isaacs on: 11/16/2011  5:22 PM<BR>     Documentation filed: Charges VN

## 2011-11-17 ENCOUNTER — Encounter (HOSPITAL_COMMUNITY)
Admission: RE | Admit: 2011-11-17 | Discharge: 2011-11-17 | Disposition: A | Discharge status: Home / Self Care | Payer: BC Managed Care – PPO | Source: Ambulatory Visit | Attending: Oncology | Admitting: Oncology

## 2011-11-17 ENCOUNTER — Encounter (HOSPITAL_COMMUNITY): Payer: Self-pay

## 2011-11-17 ENCOUNTER — Telehealth (INDEPENDENT_AMBULATORY_CARE_PROVIDER_SITE_OTHER): Payer: Self-pay | Admitting: General Surgery

## 2011-11-17 ENCOUNTER — Telehealth: Payer: Self-pay | Admitting: Oncology

## 2011-11-17 ENCOUNTER — Telehealth (INDEPENDENT_AMBULATORY_CARE_PROVIDER_SITE_OTHER): Payer: Self-pay

## 2011-11-17 DIAGNOSIS — C210 Malignant neoplasm of anus, unspecified: Secondary | ICD-10-CM

## 2011-11-17 HISTORY — DX: Malignant (primary) neoplasm, unspecified: C80.1

## 2011-11-17 LAB — GLUCOSE, CAPILLARY: Glucose-Capillary: 106 mg/dL — ABNORMAL HIGH (ref 70–99)

## 2011-11-17 IMAGING — CT NM PET TUM IMG INITIAL (PI) SKULL BASE T - THIGH
6 series · 25 of 25 positions shown · IV contrast (350 OM)
Comparison: [DATE]

CLINICAL DATA: Initial treatment strategy for anal cancer.

NUCLEAR MEDICINE PET CT INITIAL (PI) SKULL BASE TO THIGH
TECHNIQUE: 16.5 mCi F-18 FDG was injected intravenously via the
right antecubital fossa.  Full-ring PET imaging was performed from
the skull base through the mid-thighs  after injection.  CT data
was obtained and used for attenuation correction and anatomic
localization only.  (This was not acquired as a diagnostic CT
examination.)
Fasting Blood Glucose:  106

[Series 1: pet ac · axial · 3.3mm · 4.69mm/px · z∈[-870,+0]mm · 5 of 267 slices shown]
[im 1/267]
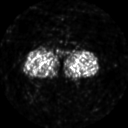
[im 67/267]
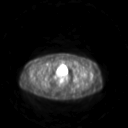
[im 134/267]
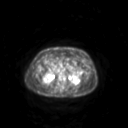
[im 200/267]
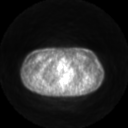
[im 267/267]
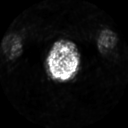

[Series 2: ct images · axial · 3.8mm · 0.98mm/px · z∈[-870,+0]mm · 5 of 263 slices shown]
[im 1/263]
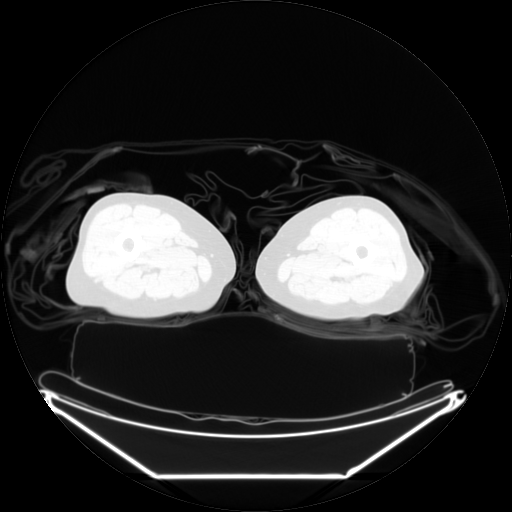
[im 66/263]
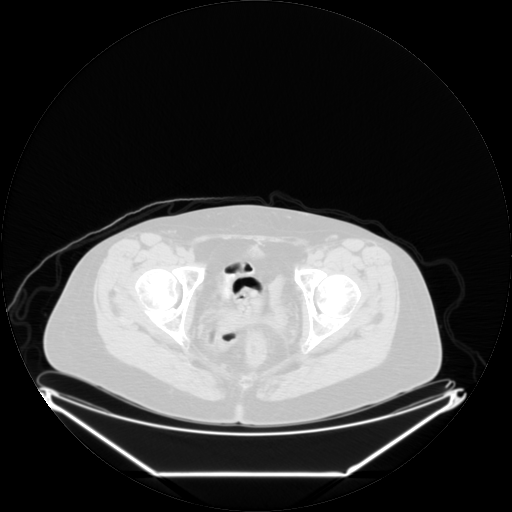
[im 132/263]
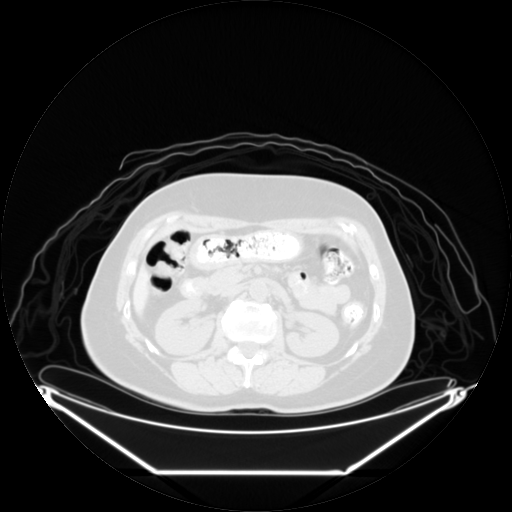
[im 197/263]
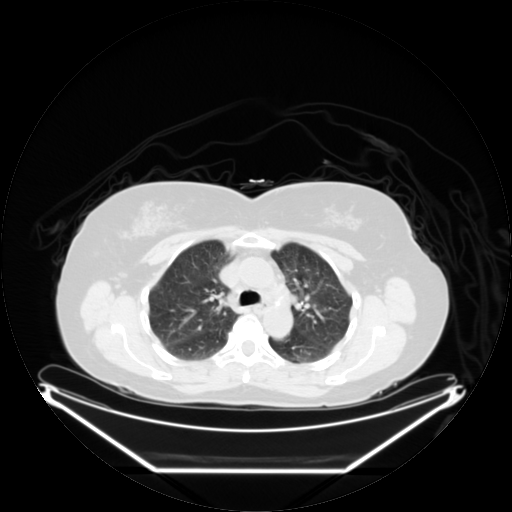
[im 263/263  brain]
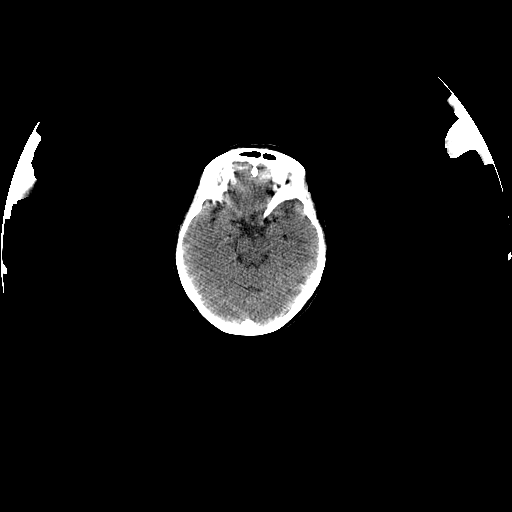

[Series 2: pet nac · axial · 3.3mm · 4.69mm/px · z∈[-870,+0]mm · 6 of 267 slices shown]
[im 1/267]
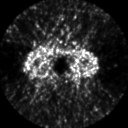
[im 54/267]
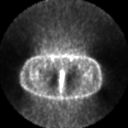
[im 107/267]
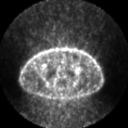
[im 160/267]
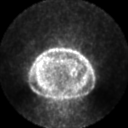
[im 213/267]
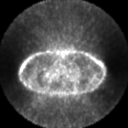
[im 267/267]
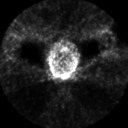

[Series 123: mip · coronal · 3.3mm · 4.69mm/px · 1 of 30 slices shown]
[im 1/30]
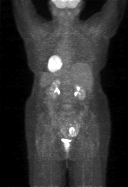

[Series 151: reformatted · axial · 3.3mm · 3.91mm/px · z∈[-870,+0]mm · 6 of 267 slices shown (1 of 2)]
[im 1/267]
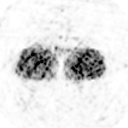
[im 54/267]
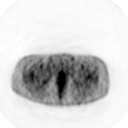
[im 107/267]
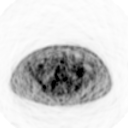
[im 160/267]
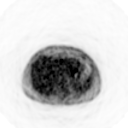
[im 213/267]
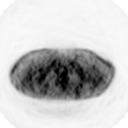
[im 267/267]
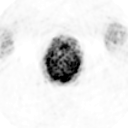

[Series 153: reformatted · coronal · 4.7mm · 6.98mm/px · 2 of 77 slices shown (2 of 2)]
[im 1/77]
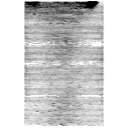
[im 77/77]
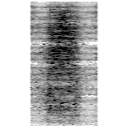

[25 of 25 positions shown; findings below may reference images not displayed]

FINDINGS: There are no hypermetabolic lymph nodes within the neck.

There are no hypermetabolic supraclavicular or axillary lymph
nodes.

No hypermetabolic mediastinal or hilar lymph nodes.  No pericardial
or pleural effusion.

Calcified granuloma is noted in the left upper lobe.

No hypermetabolic pulmonary parenchymal nodules or masses
identified.

There is no abnormal FDG uptake identified within the liver.
Several calcified splenic granulomas noted.  The adrenal glands are
normal.

Normal appearance of the pancreas.

No hypermetabolic upper abdominal lymph nodes.

There is a large hypermetabolic mass at the level of the anal
verge.  This measures 3.2 x 2.8 x 4.4 cm, image 208.  The SUV max
associated with this mass is equal to 14.82.

No hypermetabolic pelvic or inguinal adenopathy identified.

Urinary bladder appears normal.

No free fluid or fluid collections identified within the abdomen or
pelvis.

There is no abnormal FDG uptake identified within the visualized
axial or appendicular skeleton.
IMPRESSION: 1.  Large mass at the level of the anus is intensely hypermetabolic
consistent with anal cancer.
2.  There are no specific features identified to suggest
hypermetabolic lymph node metastases or distant metastatic disease.

## 2011-11-17 NOTE — Telephone Encounter (Signed)
l/m with appt info for 1/28 and 2/5  aom

## 2011-11-17 NOTE — Telephone Encounter (Signed)
Pt called and left msg re:cx 2-5 appt with Dr Derrell Lolling due to soo many other onc and rad onc appts. She begins her chemo 1-28 and has asked if she can put off f/u with Dr Derrell Lolling until farther out due to treatment schedule and trying to work. I lmom that if she needs to cx 2-5 appt this would be ok just f/u with [place] later in her treatment, approx end of feb. Pt to call me back with decision.

## 2011-11-18 ENCOUNTER — Ambulatory Visit
Admission: RE | Admit: 2011-11-18 | Discharge: 2011-11-18 | Disposition: A | Discharge status: Home / Self Care | Payer: BC Managed Care – PPO | Source: Ambulatory Visit | Attending: Radiation Oncology | Admitting: Radiation Oncology

## 2011-11-18 ENCOUNTER — Other Ambulatory Visit: Payer: BC Managed Care – PPO

## 2011-11-18 ENCOUNTER — Encounter: Payer: Self-pay | Admitting: *Deleted

## 2011-11-18 DIAGNOSIS — C211 Malignant neoplasm of anal canal: Secondary | ICD-10-CM

## 2011-11-18 NOTE — Progress Notes (Signed)
Met with patient to discuss RO billing.  Patient had no concerns today. 

## 2011-11-19 NOTE — Progress Notes (Signed)
Simulation/treatment planning note:  The patient underwent simulation/treatment planning on 11/18/2011. She was given oral contrast prior to being placed prone on the CT table. She had a couple full bladder as instructed. She was placed on a "belly board". 10-15 cc of air was instilled into the rectum. A vaginal swab was placed into the vaginal canal. A BB was placed on the anal verge. She was then scanned. I contoured her primary tumor based on her PET scan findings. This was expanded by 2.5 cm to create CTV 54. His CTV 54 6 pain oh by 0.5 cm to create a PTV 54. PT V. 54 will receive 5400 cGy in 30 sessions. I also contoured her inguinal, iliac, and presacral lymph nodes and labeled this CTV 45. This was expanded by 0.5 cm to create a PTV 45. This volume will receive 4500 cGy in in 30 sessions. She will receive concomitant chemotherapy in this represents a special treatment procedure. We can expect significant skin, and perhaps GI toxicity along with lowering of her blood counts. She'll be closely monitored with a weekly CBC and close monitoring of her skin reaction. She is now ready for IM RT planning/simulation will be treated with IMRT utilizing helical Tomotherapy with 6 MV photons.

## 2011-11-22 ENCOUNTER — Encounter: Payer: Self-pay | Admitting: Radiation Oncology

## 2011-11-22 ENCOUNTER — Other Ambulatory Visit: Payer: Self-pay | Admitting: *Deleted

## 2011-11-22 DIAGNOSIS — C211 Malignant neoplasm of anal canal: Secondary | ICD-10-CM

## 2011-11-22 MED ORDER — PROCHLORPERAZINE MALEATE 10 MG PO TABS: 10.0000 mg | ORAL_TABLET | Freq: Four times a day (QID) | ORAL | Status: DC | PRN

## 2011-11-22 NOTE — Progress Notes (Signed)
IMRT simulation/treatment planning: The patient completed IMRT simulation/treatment planning today in the management of her squamous cell carcinoma of the anus. IMRT was chosen to decrease the risk for bowel, bladder, and rectal toxicity compared to conventional or 3-D conformal radiation therapy. Dose volume histograms were obtained for the target and avoidance structures. We met our department all target goals. We did not meet all of our avoidance goals with respect to the bladder and bowel. However, I do not want to compromise coverage of our targets and accepted dose is delivered to the bladder, and bowel. We are delivering 5400 cGy in 30 sessions to be primary tumor PTV and 45 or centigray in 30 sessions to her high risk lymph nodes. She is being treated with 6 MV photons, helical IMRT Tomotherapy. She'll undergo daily MV CT for image guidance. She is to be treated with a complete full bladder we'll be setting up to her bony anatomy and also anus. She'll receive concomitant chemotherapy in this constitutes a special treatment procedure because of the increased risk for bladder, bowel, skin, and hematologic toxicity. Please see the [facility] EMR for the dose volume histograms.

## 2011-11-22 NOTE — Progress Notes (Signed)
Confirmed with patient the time line for her on 11/29/11 and stressed she does need a driver to take her home for the 1st treatment only.

## 2011-11-23 ENCOUNTER — Encounter: Payer: Self-pay | Admitting: *Deleted

## 2011-11-23 ENCOUNTER — Other Ambulatory Visit (HOSPITAL_COMMUNITY): Payer: BC Managed Care – PPO

## 2011-11-23 NOTE — Progress Notes (Signed)
[facility] Psychosocial Distress Screening Clinical Social Work  Clinical Social Work was referred by distress screening protocol.  The patient scored a 5 on the Psychosocial Distress Thermometer which indicates moderate distress. Clinical Social Worker was unable to meet with the patient in the exam room; therefore contacted patient by phone to assess for distress and other psychosocial needs.  After multiple attends to contact pt CSW left a message informing pt of the supportive services and support team at [facility].  CSW encouraged pt to call with any questions or concerns.    Clinical Social Worker follow up needed: not at this time  Tamala Julian, LCSW

## 2011-11-24 ENCOUNTER — Ambulatory Visit: Payer: BC Managed Care – PPO | Admitting: Radiation Oncology

## 2011-11-28 ENCOUNTER — Other Ambulatory Visit: Payer: Self-pay | Admitting: Oncology

## 2011-11-29 ENCOUNTER — Ambulatory Visit (HOSPITAL_COMMUNITY)
Admission: RE | Admit: 2011-11-29 | Discharge: 2011-11-29 | Disposition: A | Discharge status: Home / Self Care | Payer: BC Managed Care – PPO | Source: Ambulatory Visit | Attending: Oncology | Admitting: Oncology

## 2011-11-29 ENCOUNTER — Encounter: Payer: Self-pay | Admitting: Radiation Oncology

## 2011-11-29 ENCOUNTER — Other Ambulatory Visit: Payer: Self-pay | Admitting: Oncology

## 2011-11-29 ENCOUNTER — Ambulatory Visit (HOSPITAL_BASED_OUTPATIENT_CLINIC_OR_DEPARTMENT_OTHER): Payer: BC Managed Care – PPO

## 2011-11-29 ENCOUNTER — Ambulatory Visit
Admission: RE | Admit: 2011-11-29 | Discharge: 2011-11-29 | Disposition: A | Discharge status: Home / Self Care | Payer: BC Managed Care – PPO | Source: Ambulatory Visit | Attending: Radiation Oncology | Admitting: Radiation Oncology

## 2011-11-29 VITALS — Wt 150.2 lb

## 2011-11-29 VITALS — BP 135/71 | HR 65 | Temp 97.7°F

## 2011-11-29 DIAGNOSIS — C210 Malignant neoplasm of anus, unspecified: Secondary | ICD-10-CM | POA: Insufficient documentation

## 2011-11-29 DIAGNOSIS — C211 Malignant neoplasm of anal canal: Secondary | ICD-10-CM

## 2011-11-29 DIAGNOSIS — Z5111 Encounter for antineoplastic chemotherapy: Secondary | ICD-10-CM

## 2011-11-29 IMAGING — XA IR FLUORO GUIDE CV LINE*R*
2 series · 2 of 2 positions shown · non-contrast
Comparison: none

CLINICAL DATA: 65-year-old with anal cancer.

[Series 1: ir fluoro guide cv line*right* · 1 of 1 slices shown]
[im 1/1]
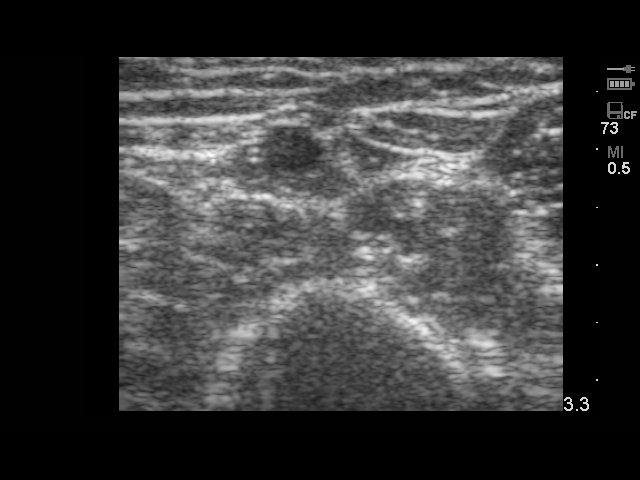

[Series 300: ir fluoro guide cv line*r* · 1 of 1 slices shown]
[im 1/1]
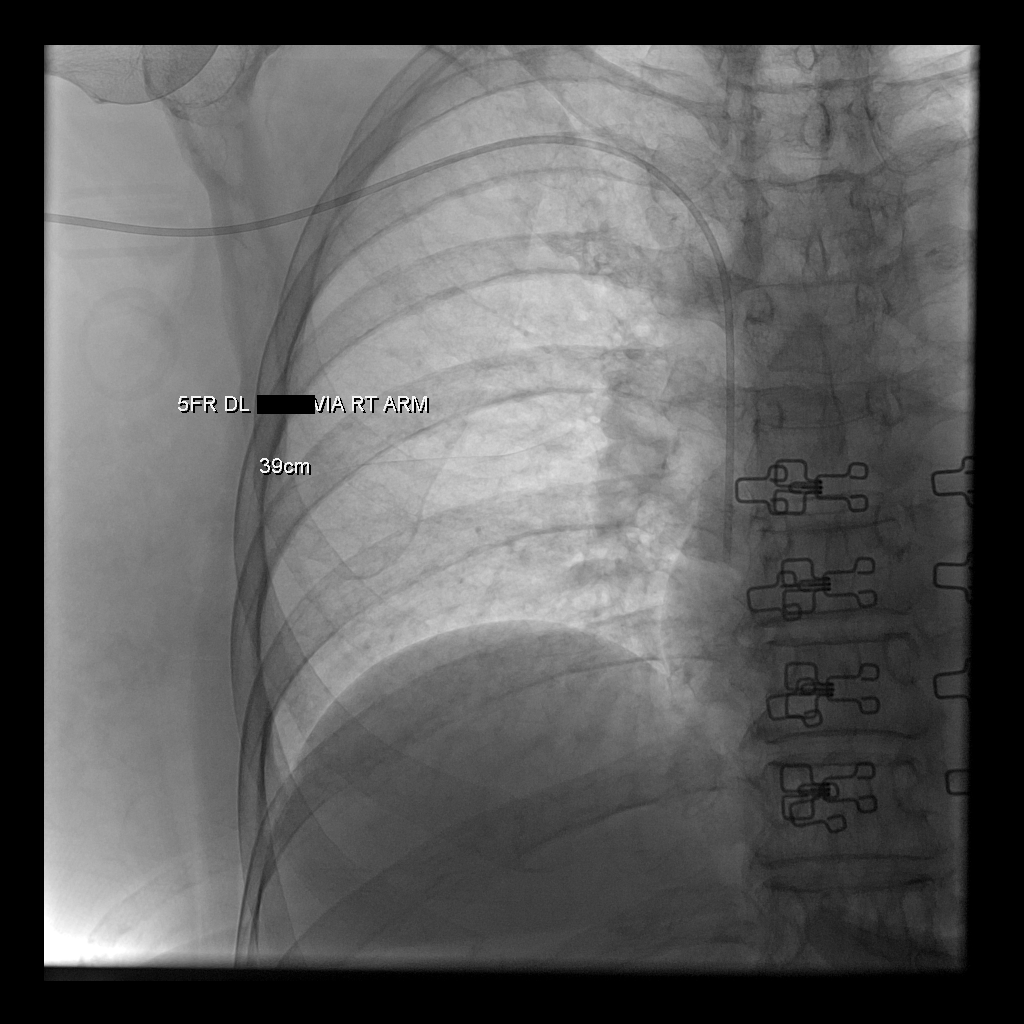

[2 of 2 positions shown; findings below may reference images not displayed]

PICC LINE PLACEMENT WITH ULTRASOUND AND FLUOROSCOPIC  GUIDANCE

Fluoroscopy Time: 0.1 minutes.

The right arm was prepped with chlorhexidine, draped in the usual
sterile fashion using maximum barrier technique (cap and mask,
sterile gown, sterile gloves, large sterile sheet, hand hygiene and
cutaneous antisepsis) and infiltrated locally with 1% Lidocaine.

Ultrasound demonstrated patency of the right basilic vein, and this
was documented with an image.  Under real-time ultrasound guidance,
this vein was accessed with a 21 gauge micropuncture needle and
image documentation was performed.  The needle was exchanged over a
guidewire for a peel-away sheath through which a five French dual
lumen PICC trimmed to 39 cm was advanced, positioned with its tip
at the lower SVC/right atrial junction.  Fluoroscopy during the
procedure and fluoro spot radiograph confirms appropriate catheter
position.  The catheter was flushed, secured to the skin with
Prolene sutures, and covered with a sterile dressing.

Complications:  None
IMPRESSION: Successful right arm PICC line placement with ultrasound and
fluoroscopic guidance.  The catheter is ready for use.

## 2011-11-29 MED ORDER — SODIUM CHLORIDE 0.9 % IV SOLN
Freq: Once | INTRAVENOUS | Status: AC
  Administered 2011-11-29: 12:00:00 via INTRAVENOUS

## 2011-11-29 MED ORDER — DEXAMETHASONE SODIUM PHOSPHATE 10 MG/ML IJ SOLN
10.0000 mg | Freq: Once | INTRAMUSCULAR | Status: AC
  Administered 2011-11-29: 10 mg via INTRAVENOUS

## 2011-11-29 MED ORDER — SODIUM CHLORIDE 0.9 % IJ SOLN
10.0000 mL | INTRAMUSCULAR | Status: DC | PRN
  Filled 2011-11-29: qty 10

## 2011-11-29 MED ORDER — SODIUM CHLORIDE 0.9 % IV SOLN
1000.0000 mg/m2/d | INTRAVENOUS | Status: DC
  Administered 2011-11-29: 6950 mg via INTRAVENOUS
  Filled 2011-11-29: qty 139

## 2011-11-29 MED ORDER — MITOMYCIN CHEMO IV INJECTION 20 MG
10.0000 mg/m2 | Freq: Once | INTRAVENOUS | Status: AC
  Administered 2011-11-29: 17.5 mg via INTRAVENOUS
  Filled 2011-11-29: qty 35

## 2011-11-29 MED ORDER — ONDANSETRON 8 MG/50ML IVPB (CHCC)
8.0000 mg | Freq: Once | INTRAVENOUS | Status: AC
  Administered 2011-11-29: 8 mg via INTRAVENOUS

## 2011-11-29 MED ORDER — HEPARIN SOD (PORK) LOCK FLUSH 100 UNIT/ML IV SOLN
500.0000 [IU] | Freq: Once | INTRAVENOUS | Status: DC | PRN
  Filled 2011-11-29: qty 5

## 2011-11-29 MED ORDER — HEPARIN SOD (PORK) LOCK FLUSH 100 UNIT/ML IV SOLN
250.0000 [IU] | Freq: Once | INTRAVENOUS | Status: DC | PRN
  Filled 2011-11-29: qty 5

## 2011-11-29 NOTE — Procedures (Signed)
PICC line at cavoatrial junction.  Right basilic vein.  Length = 39 cm.

## 2011-11-29 NOTE — Progress Notes (Signed)
Chart note: The patient underwent Tomotherapy segmentation today in the management of her carcinoma of the anus. She is being treated to 11.1 delivered field widths corresponding to one set of IMRT treatment devices [phone]).

## 2011-11-29 NOTE — Progress Notes (Signed)
Patient presents to the clinic today for under treat visit with Dr. Dayton Scrape. Patient is alert and oriented to person, place, and time. No distress noted. Steady gait noted. Pleasant affect noted. Patient denies pain at this time. Patient denies nausea, vomiting, or headache. Patient has no complaints at this time. Reported all findings to Dr. Dayton Scrape.

## 2011-11-29 NOTE — Patient Instructions (Signed)
Demostrated changing batteries in portable pump.  Instructed on call the 1-800 # on pump for any problems w/ the pump..  Instructed to call [facility] for any questions, concerns, new symptoms.  Pt verbalized understanding of above and next appt for pump d/c.   Pt d/c'd in stable condition, ambulatory w/ her daughter.

## 2011-11-29 NOTE — Progress Notes (Signed)
Weekly Management Note:  Site:Anal canal/regional LNs Current Dose:  180  cGy Projected Dose: 5400  cGy  Narrative: The patient is seen today for routine under treatment assessment. CBCT/MVCT images/port films were reviewed. The chart was reviewed.   Chemotherapy starts today. No complaints today  Physical Examination: There were no vitals filed for this visit..  Weight: 150 lb 3.2 oz (68.13 kg). No change.  Impression: Tolerating radiation therapy well.  Plan: Continue radiation therapy as planned.

## 2011-11-30 ENCOUNTER — Ambulatory Visit
Admission: RE | Admit: 2011-11-30 | Discharge: 2011-11-30 | Disposition: A | Discharge status: Home / Self Care | Payer: BC Managed Care – PPO | Source: Ambulatory Visit | Attending: Radiation Oncology | Admitting: Radiation Oncology

## 2011-11-30 ENCOUNTER — Telehealth: Payer: Self-pay | Admitting: *Deleted

## 2011-11-30 DIAGNOSIS — C211 Malignant neoplasm of anal canal: Secondary | ICD-10-CM

## 2011-11-30 NOTE — Telephone Encounter (Signed)
NO DIARRHEA, NAUSEA, VOMITING, OR MUCOSITIS. PT.'S APPETITE HAS DECREASED. ENCOURAGED PT. TO EAT SMALL FREQUENT MEALS THAT INCLUDE PROTEIN. PT. CONCERNED ABOUT CONSTIPATION. INFORMED PT. TO BE SURE SHE HAS A GOOD BOWEL MOVEMENT EVERY THREE DAYS. HOWEVER THIS CHEMOTHERAPY USUALLY CAUSES DIARRHEA. PT. HAS IMODIUM AT HER HOME. PT. ALSO CONCERNED ABOUT LOSING HER HAIR DUE TO THE COST OF A WIG. EXPLAINED TO PT. THERE ARE RESOURCES TO HELP WITH THE EXPENSE OF A WIG. PT.SEEMED RELIEVED. SHE WILL CALL THIS OFFICE IF ANY PROBLEMS OR QUESTIONS ARISE.

## 2011-11-30 NOTE — Progress Notes (Signed)
Received patient in the clinic today following XRT for post sim education. Patient is alert and oriented to person, place, and time. No distress noted. Steady gait noted. Pleasant affect noted. Patient denies pain at this time. Oriented patient to staff and routine of the clinic. Provided patient with RADIATION THERAPY AND YOU handbook and reviewed pertinent information. Also, provided patient with sitz bath and reviewed use. Provided patient with this writers business card and encouraged to call with needs. Patient verbalized understanding of all things reviewed. All questions answered.

## 2011-12-01 ENCOUNTER — Ambulatory Visit
Admission: RE | Admit: 2011-12-01 | Discharge: 2011-12-01 | Disposition: A | Discharge status: Home / Self Care | Payer: BC Managed Care – PPO | Source: Ambulatory Visit | Attending: Radiation Oncology | Admitting: Radiation Oncology

## 2011-12-01 ENCOUNTER — Encounter: Payer: Self-pay | Admitting: Radiation Oncology

## 2011-12-01 ENCOUNTER — Telehealth (INDEPENDENT_AMBULATORY_CARE_PROVIDER_SITE_OTHER): Payer: Self-pay

## 2011-12-01 NOTE — Telephone Encounter (Signed)
Received msg from pt with need to fax return to work note with no restrictions and rtw date of 1-22. I have faxed rtw note and received a confirmation that fax was received at [phone]. LMOM for pt that this has been done, Per pt request.

## 2011-12-02 ENCOUNTER — Ambulatory Visit
Admission: RE | Admit: 2011-12-02 | Discharge: 2011-12-02 | Disposition: A | Discharge status: Home / Self Care | Payer: BC Managed Care – PPO | Source: Ambulatory Visit | Attending: Radiation Oncology | Admitting: Radiation Oncology

## 2011-12-03 ENCOUNTER — Ambulatory Visit
Admission: RE | Admit: 2011-12-03 | Discharge: 2011-12-03 | Disposition: A | Discharge status: Home / Self Care | Payer: BC Managed Care – PPO | Source: Ambulatory Visit | Attending: Radiation Oncology | Admitting: Radiation Oncology

## 2011-12-03 ENCOUNTER — Ambulatory Visit (HOSPITAL_BASED_OUTPATIENT_CLINIC_OR_DEPARTMENT_OTHER): Payer: BC Managed Care – PPO

## 2011-12-03 VITALS — BP 132/64 | HR 82 | Temp 98.4°F

## 2011-12-03 DIAGNOSIS — Z469 Encounter for fitting and adjustment of unspecified device: Secondary | ICD-10-CM

## 2011-12-03 DIAGNOSIS — C211 Malignant neoplasm of anal canal: Secondary | ICD-10-CM

## 2011-12-03 MED ORDER — HEPARIN SOD (PORK) LOCK FLUSH 100 UNIT/ML IV SOLN
500.0000 [IU] | Freq: Once | INTRAVENOUS | Status: AC | PRN
  Administered 2011-12-03: 500 [IU]
  Filled 2011-12-03: qty 5

## 2011-12-03 MED ORDER — SODIUM CHLORIDE 0.9 % IJ SOLN
10.0000 mL | INTRAMUSCULAR | Status: DC | PRN
  Administered 2011-12-03: 10 mL
  Filled 2011-12-03: qty 10

## 2011-12-06 ENCOUNTER — Other Ambulatory Visit (HOSPITAL_COMMUNITY): Payer: BC Managed Care – PPO

## 2011-12-06 ENCOUNTER — Ambulatory Visit
Admission: RE | Admit: 2011-12-06 | Discharge: 2011-12-06 | Disposition: A | Discharge status: Home / Self Care | Payer: BC Managed Care – PPO | Source: Ambulatory Visit | Attending: Radiation Oncology | Admitting: Radiation Oncology

## 2011-12-06 ENCOUNTER — Ambulatory Visit (HOSPITAL_BASED_OUTPATIENT_CLINIC_OR_DEPARTMENT_OTHER): Payer: BC Managed Care – PPO

## 2011-12-06 ENCOUNTER — Encounter: Payer: Self-pay | Admitting: Radiation Oncology

## 2011-12-06 DIAGNOSIS — C20 Malignant neoplasm of rectum: Secondary | ICD-10-CM

## 2011-12-06 DIAGNOSIS — C211 Malignant neoplasm of anal canal: Secondary | ICD-10-CM

## 2011-12-06 MED ORDER — HEPARIN SOD (PORK) LOCK FLUSH 100 UNIT/ML IV SOLN
500.0000 [IU] | Freq: Once | INTRAVENOUS | Status: AC
  Administered 2011-12-06: 500 [IU] via INTRAVENOUS
  Filled 2011-12-06: qty 5

## 2011-12-06 MED ORDER — SODIUM CHLORIDE 0.9 % IJ SOLN
10.0000 mL | INTRAMUSCULAR | Status: DC | PRN
  Administered 2011-12-06: 10 mL via INTRAVENOUS
  Filled 2011-12-06: qty 10

## 2011-12-06 NOTE — Progress Notes (Signed)
Weekly Management Note:  Site:Anal Canal/regional LNs Current Dose:  1080  cGy Projected Dose: 5400  cGy  Narrative: The patient is seen today for routine under treatment assessment. CBCT/MVCT images/port films were reviewed. The chart was reviewed.   She has a URI and worsening anorectal discomfort. She also has mouth discomfort. Otherwise, her chemotherapy last week was well tolerated. She has not yet taken any pain medication. She does have hydrocodone/APAP to use when necessary. She is concerned about her financial situation and we will have her get in touch with one of our counselors.  Physical Examination:  Filed Vitals:   12/06/11 1432  BP: 112/71  Pulse: 87  Temp: 98.8 F (37.1 C)  Resp: 20  .  Weight: 149 lb 11.2 oz (67.903 kg). Oral cavity unremarkable to inspection.  Impression: Tolerating radiation therapy well. She believes that she will need to be excused from work for the remainder of her treatment, and also following her treatment to recuperate. She appears to have plenty of sicks days.  Plan: Continue radiation therapy as planned.

## 2011-12-06 NOTE — Progress Notes (Signed)
Pt states she's had cold since last Wed. Taking Alka Seltzer plus, Nyquil at night. Dry cough, sore throat, rhinitis, some blood when she blows her nose. She states cold "is in her head, not chest". Increasing fatigue; will ask for time off work, stating she has many sick days she can take. Gave pt Aquafor for dry lips.  Constipation last week, took ex-lax and states bm normal now. Advised she use stool softeners instead.  First chemo last week, states she has one more chemo.

## 2011-12-07 ENCOUNTER — Encounter: Payer: Self-pay | Admitting: Radiation Oncology

## 2011-12-07 ENCOUNTER — Ambulatory Visit
Admission: RE | Admit: 2011-12-07 | Discharge: 2011-12-07 | Disposition: A | Discharge status: Home / Self Care | Payer: BC Managed Care – PPO | Source: Ambulatory Visit | Attending: Radiation Oncology | Admitting: Radiation Oncology

## 2011-12-07 ENCOUNTER — Encounter (INDEPENDENT_AMBULATORY_CARE_PROVIDER_SITE_OTHER): Payer: BC Managed Care – PPO | Admitting: General Surgery

## 2011-12-07 NOTE — Progress Notes (Signed)
Patient called and asked to come by because she was concerned about bills she was receiving.  Stopped by to fill out epp.  Patient overqualified.  Mailed letter to patient.

## 2011-12-08 ENCOUNTER — Ambulatory Visit
Admission: RE | Admit: 2011-12-08 | Discharge: 2011-12-08 | Disposition: A | Discharge status: Home / Self Care | Payer: BC Managed Care – PPO | Source: Ambulatory Visit | Attending: Radiation Oncology | Admitting: Radiation Oncology

## 2011-12-08 ENCOUNTER — Ambulatory Visit (HOSPITAL_BASED_OUTPATIENT_CLINIC_OR_DEPARTMENT_OTHER): Payer: BC Managed Care – PPO

## 2011-12-08 DIAGNOSIS — C210 Malignant neoplasm of anus, unspecified: Secondary | ICD-10-CM

## 2011-12-08 MED ORDER — HEPARIN SOD (PORK) LOCK FLUSH 100 UNIT/ML IV SOLN
500.0000 [IU] | Freq: Once | INTRAVENOUS | Status: AC
  Administered 2011-12-08: 500 [IU] via INTRAVENOUS
  Filled 2011-12-08: qty 5

## 2011-12-08 MED ORDER — SODIUM CHLORIDE 0.9 % IJ SOLN
10.0000 mL | INTRAMUSCULAR | Status: DC | PRN
  Administered 2011-12-08: 10 mL via INTRAVENOUS
  Filled 2011-12-08: qty 10

## 2011-12-09 ENCOUNTER — Ambulatory Visit
Admission: RE | Admit: 2011-12-09 | Discharge: 2011-12-09 | Disposition: A | Discharge status: Home / Self Care | Payer: BC Managed Care – PPO | Source: Ambulatory Visit | Attending: Radiation Oncology | Admitting: Radiation Oncology

## 2011-12-09 ENCOUNTER — Telehealth: Payer: Self-pay | Admitting: *Deleted

## 2011-12-09 NOTE — Telephone Encounter (Signed)
Clinical Social Work received referral from [employer], Database administrator, for financial resources. Pt overqualified for EPP assistance, however, cannot afford to pay her medical bills. CSW left VM requesting patient return call for CSW assistance. Kathrin Penner, MSW, [facility] Clinical Social Worker [facility] [phone]

## 2011-12-10 ENCOUNTER — Ambulatory Visit
Admission: RE | Admit: 2011-12-10 | Discharge: 2011-12-10 | Disposition: A | Discharge status: Home / Self Care | Payer: BC Managed Care – PPO | Source: Ambulatory Visit | Attending: Radiation Oncology | Admitting: Radiation Oncology

## 2011-12-10 ENCOUNTER — Ambulatory Visit: Payer: BC Managed Care – PPO | Admitting: Nurse Practitioner

## 2011-12-10 ENCOUNTER — Ambulatory Visit (HOSPITAL_BASED_OUTPATIENT_CLINIC_OR_DEPARTMENT_OTHER): Payer: BC Managed Care – PPO

## 2011-12-10 VITALS — BP 133/81 | HR 76 | Temp 98.4°F

## 2011-12-10 DIAGNOSIS — C801 Malignant (primary) neoplasm, unspecified: Secondary | ICD-10-CM

## 2011-12-10 DIAGNOSIS — Z452 Encounter for adjustment and management of vascular access device: Secondary | ICD-10-CM

## 2011-12-10 MED ORDER — HEPARIN SOD (PORK) LOCK FLUSH 100 UNIT/ML IV SOLN
250.0000 [IU] | Freq: Once | INTRAVENOUS | Status: AC
  Administered 2011-12-10: 250 [IU] via INTRAVENOUS
  Filled 2011-12-10: qty 5

## 2011-12-10 MED ORDER — SODIUM CHLORIDE 0.9 % IJ SOLN
3.0000 mL | Freq: Once | INTRAMUSCULAR | Status: AC
  Administered 2011-12-10: 3 mL via INTRAVENOUS
  Filled 2011-12-10: qty 10

## 2011-12-13 ENCOUNTER — Ambulatory Visit
Admission: RE | Admit: 2011-12-13 | Discharge: 2011-12-13 | Disposition: A | Discharge status: Home / Self Care | Payer: BC Managed Care – PPO | Source: Ambulatory Visit | Attending: Radiation Oncology | Admitting: Radiation Oncology

## 2011-12-13 ENCOUNTER — Encounter: Payer: Self-pay | Admitting: Radiation Oncology

## 2011-12-13 ENCOUNTER — Ambulatory Visit (HOSPITAL_BASED_OUTPATIENT_CLINIC_OR_DEPARTMENT_OTHER): Payer: BC Managed Care – PPO

## 2011-12-13 VITALS — BP 141/72 | HR 91 | Resp 18 | Wt 145.4 lb

## 2011-12-13 DIAGNOSIS — C211 Malignant neoplasm of anal canal: Secondary | ICD-10-CM

## 2011-12-13 DIAGNOSIS — C210 Malignant neoplasm of anus, unspecified: Secondary | ICD-10-CM

## 2011-12-13 MED ORDER — OXYCODONE HCL 5 MG PO TABS: 5.0000 mg | ORAL_TABLET | ORAL | Status: AC | PRN

## 2011-12-13 MED ORDER — SODIUM CHLORIDE 0.9 % IJ SOLN
10.0000 mL | INTRAMUSCULAR | Status: DC | PRN
  Administered 2011-12-13: 10 mL via INTRAVENOUS
  Filled 2011-12-13: qty 10

## 2011-12-13 MED ORDER — OXYCODONE HCL 10 MG PO TB12: 10.0000 mg | ORAL_TABLET | Freq: Two times a day (BID) | ORAL | Status: AC

## 2011-12-13 MED ORDER — HEPARIN SOD (PORK) LOCK FLUSH 100 UNIT/ML IV SOLN
500.0000 [IU] | Freq: Once | INTRAVENOUS | Status: AC
  Administered 2011-12-13: 500 [IU] via INTRAVENOUS
  Filled 2011-12-13: qty 5

## 2011-12-13 NOTE — Progress Notes (Signed)
Weekly Management Note:  Site:Anal Canal/Regional LNs Current Dose:  1980  cGy Projected Dose: 5400  cGy  Narrative: The patient is seen today for routine under treatment assessment. CBCT/MVCT images/port films were reviewed. The chart was reviewed.   She reports worsening rectal and perianal pain (7/10). She has been taking only extra strength Tylenol. Her weight is down 3.5 pounds. Blood counts are satisfactory.  Physical Examination:  Filed Vitals:   12/13/11 1437  BP: 141/72  Pulse: 91  Resp: 18  .  Weight: 145 lb 6.4 oz (65.953 kg). On inspection of the perianal region there is patchy moist desquamation.  Impression: Tolerating radiation therapy well. However, we need to improve her pain management. I'm starting her on OxyContin 10 mg by mouth twice a day (dispense #60) and oxycodone 5 mg tabs to take 1 by mouth every 4 hours when necessary (dispense #60)  Plan: Continue radiation therapy as planned.

## 2011-12-13 NOTE — Progress Notes (Signed)
Patient presents to the clinic today unaccompanied for under treat visit with Dr. Dayton Scrape. Patient is alert and oriented to person, place, and time. No distress noted. Steady gait noted. Pleasant affect noted. Patient reports rectal pain 7 on a scale of 0-10. Patient states, "my bottom is raw." Patient denies diarrhea but reports copious flatus. Patient reports applying de satin to her bottom but reassures this writer she cleans it off four hours prior to treatment. Patient reports that she plans to start using the sitz bath provided by this writer today when she gets home. Reported all findings to Dr. Dayton Scrape.

## 2011-12-14 ENCOUNTER — Ambulatory Visit
Admission: RE | Admit: 2011-12-14 | Discharge: 2011-12-14 | Disposition: A | Discharge status: Home / Self Care | Payer: BC Managed Care – PPO | Source: Ambulatory Visit | Attending: Radiation Oncology | Admitting: Radiation Oncology

## 2011-12-15 ENCOUNTER — Ambulatory Visit (HOSPITAL_BASED_OUTPATIENT_CLINIC_OR_DEPARTMENT_OTHER): Payer: BC Managed Care – PPO

## 2011-12-15 ENCOUNTER — Ambulatory Visit
Admission: RE | Admit: 2011-12-15 | Discharge: 2011-12-15 | Disposition: A | Discharge status: Home / Self Care | Payer: BC Managed Care – PPO | Source: Ambulatory Visit | Attending: Radiation Oncology | Admitting: Radiation Oncology

## 2011-12-15 DIAGNOSIS — C210 Malignant neoplasm of anus, unspecified: Secondary | ICD-10-CM

## 2011-12-15 MED ORDER — SODIUM CHLORIDE 0.9 % IJ SOLN
10.0000 mL | INTRAMUSCULAR | Status: DC | PRN
  Administered 2011-12-15: 10 mL via INTRAVENOUS
  Filled 2011-12-15: qty 10

## 2011-12-15 MED ORDER — HEPARIN SOD (PORK) LOCK FLUSH 100 UNIT/ML IV SOLN
500.0000 [IU] | Freq: Once | INTRAVENOUS | Status: AC
  Administered 2011-12-15: 500 [IU] via INTRAVENOUS
  Filled 2011-12-15: qty 5

## 2011-12-15 NOTE — Progress Notes (Signed)
Picc line with both ports flushed and positive blood return, then flushed with heprin, dressing reinforced.

## 2011-12-16 ENCOUNTER — Ambulatory Visit
Admission: RE | Admit: 2011-12-16 | Discharge: 2011-12-16 | Disposition: A | Discharge status: Home / Self Care | Payer: BC Managed Care – PPO | Source: Ambulatory Visit | Attending: Radiation Oncology | Admitting: Radiation Oncology

## 2011-12-17 ENCOUNTER — Ambulatory Visit (HOSPITAL_BASED_OUTPATIENT_CLINIC_OR_DEPARTMENT_OTHER): Payer: BC Managed Care – PPO

## 2011-12-17 ENCOUNTER — Ambulatory Visit
Admission: RE | Admit: 2011-12-17 | Discharge: 2011-12-17 | Disposition: A | Discharge status: Home / Self Care | Payer: BC Managed Care – PPO | Source: Ambulatory Visit | Attending: Radiation Oncology | Admitting: Radiation Oncology

## 2011-12-17 ENCOUNTER — Other Ambulatory Visit: Payer: BC Managed Care – PPO

## 2011-12-17 ENCOUNTER — Ambulatory Visit (HOSPITAL_BASED_OUTPATIENT_CLINIC_OR_DEPARTMENT_OTHER): Payer: BC Managed Care – PPO | Admitting: Nurse Practitioner

## 2011-12-17 VITALS — BP 147/64 | HR 87 | Temp 99.2°F | Ht 64.0 in | Wt 145.5 lb

## 2011-12-17 DIAGNOSIS — C210 Malignant neoplasm of anus, unspecified: Secondary | ICD-10-CM

## 2011-12-17 DIAGNOSIS — K625 Hemorrhage of anus and rectum: Secondary | ICD-10-CM

## 2011-12-17 DIAGNOSIS — D649 Anemia, unspecified: Secondary | ICD-10-CM

## 2011-12-17 DIAGNOSIS — C211 Malignant neoplasm of anal canal: Secondary | ICD-10-CM

## 2011-12-17 LAB — CBC WITH DIFFERENTIAL/PLATELET
BASO%: 0.6 % (ref 0.0–2.0)
EOS%: 1.6 % (ref 0.0–7.0)
HCT: 29 % — ABNORMAL LOW (ref 34.8–46.6)
MCH: 29.9 pg (ref 25.1–34.0)
MCHC: 33.8 g/dL (ref 31.5–36.0)
MONO#: 0.3 10*3/uL (ref 0.1–0.9)
NEUT%: 60.8 % (ref 38.4–76.8)
RBC: 3.27 10*6/uL — ABNORMAL LOW (ref 3.70–5.45)
RDW: 13.2 % (ref 11.2–14.5)
WBC: 2 10*3/uL — ABNORMAL LOW (ref 3.9–10.3)
lymph#: 0.4 10*3/uL — ABNORMAL LOW (ref 0.9–3.3)

## 2011-12-17 MED ORDER — SILVER SULFADIAZINE 1 % EX CREA
TOPICAL_CREAM | Freq: Three times a day (TID) | CUTANEOUS | Status: DC
  Administered 2011-12-17: 1 via TOPICAL

## 2011-12-17 MED ORDER — SODIUM CHLORIDE 0.9 % IJ SOLN
10.0000 mL | INTRAMUSCULAR | Status: DC | PRN
  Administered 2011-12-17: 10 mL via INTRAVENOUS
  Filled 2011-12-17: qty 10

## 2011-12-17 MED ORDER — HEPARIN SOD (PORK) LOCK FLUSH 100 UNIT/ML IV SOLN
500.0000 [IU] | Freq: Once | INTRAVENOUS | Status: AC
  Administered 2011-12-17: 500 [IU] via INTRAVENOUS
  Filled 2011-12-17: qty 5

## 2011-12-17 NOTE — Progress Notes (Signed)
  Radiation Oncology         ([phone]) [phone] ________________________________  Name: [name]: [id number]  Date: 12/17/2011  DOB: 10/03/1946  Weekly Radiation Therapy Management  Current Dose: 27 Gy     Planned Dose:  54 Gy  Narrative . . . . . . . . The patient presents for under treatment assessment.                                            She is complaining of increasing anal discomfort. She is using a sitz bath a few times daily. She was given prescriptions for oxycodone and OxyContin but she has not started using these medications as yet. She had some reservations about taking stronger pain medication.                                 Set-up films were reviewed.                                 The chart was checked. Physical Findings. . . focused physical examination of the gluteal crease and perianal region reveals brisk erythema with foci of moist desquamation. There is no evidence of infection. Impression . . . . . . . The patient is experiencing typical acute toxicities related to radiation. Plan . . . . . . . . . . . . Continue treatment as planned. Today, I gave the patient Silvadene to be used twice daily. I also advised her to begin using her pain medication regimen.  ________________________________  Artist Pais. Kathrynn Running, M.D.

## 2011-12-17 NOTE — Progress Notes (Signed)
OFFICE PROGRESS NOTE  Interval history:  [name] returns as scheduled. She completed cycle 1 mitomycin/5-fluorouracil beginning 11/29/2011. She developed a few mouth ulcers. She is able to eat and drink without difficulty. She reports "blisters and sores" at the rectum. She thinks she has hemorrhoids. She was recently prescribed OxyContin and has oxycodone for as needed use.   Objective: Blood pressure 147/64, pulse 87, temperature 99.2 F (37.3 C), temperature source Oral, height 5\' 4"  (1.626 m), weight 145 lb 8 oz (65.998 kg).  Healing ulceration at the right lower anterior gumline. Mucous membranes are pink and moist. Lungs are clear. Regular cardiac rhythm. Abdomen is soft and nontender. No hepatomegaly. Extremities are without edema. Right upper the PICC site is without erythema. Perianal region is erythematous with superficial areas of ulceration.  Lab Results: Lab Results  Component Value Date   WBC 2.0* 12/17/2011   HGB 9.8* 12/17/2011   HCT 29.0* 12/17/2011   MCV 88.7 12/17/2011   PLT 202 12/17/2011    Chemistry:    Chemistry      Component Value Date/Time   NA 141 11/08/2011 1455   K 4.4 11/08/2011 1455   CL 104 11/08/2011 1455   CO2 30 11/08/2011 1455   BUN 12 11/08/2011 1455   CREATININE 0.66 11/08/2011 1455      Component Value Date/Time   CALCIUM 9.8 11/08/2011 1455   ALKPHOS 103 11/08/2011 1455   AST 20 11/08/2011 1455   ALT 14 11/08/2011 1455   BILITOT 0.2* 11/08/2011 1455       Studies/Results: Ir Fluoro Guide Cv Line Right  11/29/2011  *RADIOLOGY REPORT*  Clinical Data: 66 year old with anal cancer.  PICC LINE PLACEMENT WITH ULTRASOUND AND FLUOROSCOPIC  GUIDANCE  Fluoroscopy Time: 0.1 minutes.  The right arm was prepped with chlorhexidine, draped in the usual sterile fashion using maximum barrier technique (cap and mask, sterile gown, sterile gloves, large sterile sheet, hand hygiene and cutaneous antisepsis) and infiltrated locally with 1% Lidocaine.  Ultrasound demonstrated  patency of the right basilic vein, and this was documented with an image.  Under real-time ultrasound guidance, this vein was accessed with a 21 gauge micropuncture needle and image documentation was performed.  The needle was exchanged over a guidewire for a peel-away sheath through which a five [place] dual lumen PICC trimmed to 39 cm was advanced, positioned with its tip at the lower SVC/right atrial junction.  Fluoroscopy during the procedure and fluoro spot radiograph confirms appropriate catheter position.  The catheter was flushed, secured to the skin with Prolene sutures, and covered with a sterile dressing.  Complications:  None  IMPRESSION: Successful right arm PICC line placement with ultrasound and fluoroscopic guidance.  The catheter is ready for use.  Original Report Authenticated By: Richarda Overlie, M.D.   Ir US Guide Vasc Access Right  11/29/2011  *RADIOLOGY REPORT*  Clinical Data: 66 year old with anal cancer.  PICC LINE PLACEMENT WITH ULTRASOUND AND FLUOROSCOPIC  GUIDANCE  Fluoroscopy Time: 0.1 minutes.  The right arm was prepped with chlorhexidine, draped in the usual sterile fashion using maximum barrier technique (cap and mask, sterile gown, sterile gloves, large sterile sheet, hand hygiene and cutaneous antisepsis) and infiltrated locally with 1% Lidocaine.  Ultrasound demonstrated patency of the right basilic vein, and this was documented with an image.  Under real-time ultrasound guidance, this vein was accessed with a 21 gauge micropuncture needle and image documentation was performed.  The needle was exchanged over a guidewire for a peel-away sheath through which a  five [place] dual lumen PICC trimmed to 39 cm was advanced, positioned with its tip at the lower SVC/right atrial junction.  Fluoroscopy during the procedure and fluoro spot radiograph confirms appropriate catheter position.  The catheter was flushed, secured to the skin with Prolene sutures, and covered with a sterile dressing.   Complications:  None  IMPRESSION: Successful right arm PICC line placement with ultrasound and fluoroscopic guidance.  The catheter is ready for use.  Original Report Authenticated By: Richarda Overlie, M.D.    Medications: I have reviewed the patient's current medications.  Assessment/Plan:   1. Poorly differentiated carcinoma, basaloid squamous cell carcinoma, of the anal canal and posterior vagina. Staging CT scans of the abdomen and pelvis on 11/15/2011 with suspicious perirectal lymph node and no evidence of distant metastatic disease. She began radiation 11/29/2011. She completed cycle 1 mitomycin and 5 fluorouracil 11/29/2011. 2. Rectal bleeding secondary to #1, improved. 3. Perianal erythema and superficial ulceration related to radiation. She was prescribed Silvadene cream per radiation oncology. 4. Anemia, progressive. 5. Mild neutropenia secondary to chemotherapy. She will contact the office with fever, chills or other signs of infection. 6. Status post placement right upper the PICC line 11/29/2011. The PICC line is being flushed on a Monday Wednesday Friday schedule   Disposition-[name] continues radiation. We will obtain a followup CBC 12/22/2011. She will return for a followup visit and cycle 2 mitomycin and 5-FU on 12/27/2011. She will contact the office in the interim as outlined above or with any other problems.    Lonna Cobb ANP/GNP-BC

## 2011-12-20 ENCOUNTER — Ambulatory Visit
Admission: RE | Admit: 2011-12-20 | Discharge: 2011-12-20 | Disposition: A | Discharge status: Home / Self Care | Payer: BC Managed Care – PPO | Source: Ambulatory Visit | Attending: Radiation Oncology | Admitting: Radiation Oncology

## 2011-12-20 ENCOUNTER — Telehealth: Payer: Self-pay | Admitting: Oncology

## 2011-12-20 ENCOUNTER — Encounter: Payer: Self-pay | Admitting: Radiation Oncology

## 2011-12-20 ENCOUNTER — Ambulatory Visit (HOSPITAL_BASED_OUTPATIENT_CLINIC_OR_DEPARTMENT_OTHER): Payer: BC Managed Care – PPO

## 2011-12-20 VITALS — BP 120/80 | HR 73 | Temp 97.7°F | Wt 145.0 lb

## 2011-12-20 DIAGNOSIS — C211 Malignant neoplasm of anal canal: Secondary | ICD-10-CM

## 2011-12-20 DIAGNOSIS — C210 Malignant neoplasm of anus, unspecified: Secondary | ICD-10-CM

## 2011-12-20 MED ORDER — HEPARIN SOD (PORK) LOCK FLUSH 100 UNIT/ML IV SOLN
500.0000 [IU] | Freq: Once | INTRAVENOUS | Status: AC
  Administered 2011-12-20: 500 [IU] via INTRAVENOUS
  Filled 2011-12-20: qty 5

## 2011-12-20 MED ORDER — SODIUM CHLORIDE 0.9 % IJ SOLN
10.0000 mL | INTRAMUSCULAR | Status: DC | PRN
  Administered 2011-12-20: 10 mL via INTRAVENOUS
  Filled 2011-12-20: qty 10

## 2011-12-20 MED ORDER — SILVER SULFADIAZINE 1 % EX CREA
TOPICAL_CREAM | Freq: Three times a day (TID) | CUTANEOUS | Status: DC | PRN
  Administered 2011-12-20: 1 via TOPICAL

## 2011-12-20 NOTE — Progress Notes (Signed)
Weekly Management Note:  Site:Anal Canal/regional LNs Current Dose:  2880  cGy Projected Dose: 5400  cGy  Narrative: The patient is seen today for routine under treatment assessment. CBCT/MVCT images/port films were reviewed. The chart was reviewed.   Late last week she develop more perianal discomfort and was started on Silvadene cream in addition to oxycodone. Her anorectal pain is improved. It is 5 at 10 today we are was 8 at of 10 last week. She was not able to afford OxyContin, and I told that we could substitute another long-acting narcotic. She takes a stool softener when necessary to avoid constipation. I believe that she is scheduled for more chemotherapy next week with Dr. Truett Perna. Her CBC from 12/17/2011 is remarkable for white blood count 2.0 K. neutrophil count 1.2 K. hemoglobin 9.8 and platelet count 202K  Physical Examination:  Filed Vitals:   12/20/11 1459  BP: 120/80  Pulse: 73  Temp: 97.7 F (36.5 C)  .  Weight: 145 lb (65.772 kg). No change.  Impression: Tolerating radiation therapy well with the expected degree of toxicities.  Plan: Continue radiation therapy as planned.

## 2011-12-20 NOTE — Progress Notes (Signed)
Patient states relief with Silvadene over the weekend.  Using Sitz bath prior to each application of Silvadene.  Filled Oxcodone with relief over the weekend. Unable to afford Oxycontin this weekend but will purchase on Friday.  States blood on tissue after wiping "from hemorrhoids".    Grades pain as a level 5 as compared to a level 8 on last Friday.

## 2011-12-20 NOTE — Telephone Encounter (Signed)
called pt and asked that she come by scheduling to pick up schedule for [phone]

## 2011-12-21 ENCOUNTER — Ambulatory Visit
Admission: RE | Admit: 2011-12-21 | Discharge: 2011-12-21 | Disposition: A | Discharge status: Home / Self Care | Payer: BC Managed Care – PPO | Source: Ambulatory Visit | Attending: Radiation Oncology | Admitting: Radiation Oncology

## 2011-12-22 ENCOUNTER — Ambulatory Visit
Admission: RE | Admit: 2011-12-22 | Discharge: 2011-12-22 | Disposition: A | Discharge status: Home / Self Care | Payer: BC Managed Care – PPO | Source: Ambulatory Visit | Attending: Radiation Oncology | Admitting: Radiation Oncology

## 2011-12-22 ENCOUNTER — Ambulatory Visit (HOSPITAL_BASED_OUTPATIENT_CLINIC_OR_DEPARTMENT_OTHER): Payer: BC Managed Care – PPO

## 2011-12-22 DIAGNOSIS — C210 Malignant neoplasm of anus, unspecified: Secondary | ICD-10-CM

## 2011-12-22 DIAGNOSIS — Z452 Encounter for adjustment and management of vascular access device: Secondary | ICD-10-CM

## 2011-12-22 MED ORDER — HEPARIN SOD (PORK) LOCK FLUSH 100 UNIT/ML IV SOLN
500.0000 [IU] | Freq: Once | INTRAVENOUS | Status: AC
  Administered 2011-12-22: 500 [IU] via INTRAVENOUS
  Filled 2011-12-22: qty 5

## 2011-12-22 MED ORDER — SODIUM CHLORIDE 0.9 % IJ SOLN
10.0000 mL | INTRAMUSCULAR | Status: DC | PRN
  Administered 2011-12-22: 10 mL via INTRAVENOUS
  Filled 2011-12-22: qty 10

## 2011-12-23 ENCOUNTER — Ambulatory Visit
Admission: RE | Admit: 2011-12-23 | Discharge: 2011-12-23 | Disposition: A | Discharge status: Home / Self Care | Payer: BC Managed Care – PPO | Source: Ambulatory Visit | Attending: Radiation Oncology | Admitting: Radiation Oncology

## 2011-12-24 ENCOUNTER — Ambulatory Visit
Admission: RE | Admit: 2011-12-24 | Discharge: 2011-12-24 | Disposition: A | Discharge status: Home / Self Care | Payer: BC Managed Care – PPO | Source: Ambulatory Visit | Attending: Radiation Oncology | Admitting: Radiation Oncology

## 2011-12-24 ENCOUNTER — Telehealth: Payer: Self-pay | Admitting: Radiation Oncology

## 2011-12-24 ENCOUNTER — Ambulatory Visit (HOSPITAL_BASED_OUTPATIENT_CLINIC_OR_DEPARTMENT_OTHER): Payer: BC Managed Care – PPO

## 2011-12-24 DIAGNOSIS — C20 Malignant neoplasm of rectum: Secondary | ICD-10-CM

## 2011-12-24 MED ORDER — HEPARIN SOD (PORK) LOCK FLUSH 100 UNIT/ML IV SOLN
500.0000 [IU] | Freq: Once | INTRAVENOUS | Status: AC
  Administered 2011-12-24: 500 [IU] via INTRAVENOUS
  Filled 2011-12-24: qty 5

## 2011-12-24 MED ORDER — SODIUM CHLORIDE 0.9 % IJ SOLN
10.0000 mL | INTRAMUSCULAR | Status: DC | PRN
  Administered 2011-12-24: 10 mL via INTRAVENOUS
  Filled 2011-12-24: qty 10

## 2011-12-24 NOTE — Telephone Encounter (Signed)
Phoned patient's cell phone number listed in demographics to return her call. Patient reports "last night was horrible." Patient verbalizes she was unable to get any sleep because "her bottom hurt so bad." Patient reports that this morning she went and had her oxycontin 10 mg every 12 hour prescription filled. Encouraged patient to take her Oxycontin every 12 hours and her Oxy IR every four hours for break through pain. Also, encouraged patient to use sitz bath as directed and Silvadene cream. When asked if the patient needed to take today off from radiation therapy and resume Monday she declined. Patient stated,"I only have two weeks left and I want to just tough it out." Routed this message to Dr. Dayton Scrape to inform him of this occurrence.

## 2011-12-25 ENCOUNTER — Other Ambulatory Visit: Payer: Self-pay | Admitting: Oncology

## 2011-12-27 ENCOUNTER — Telehealth: Payer: Self-pay | Admitting: *Deleted

## 2011-12-27 ENCOUNTER — Other Ambulatory Visit (HOSPITAL_BASED_OUTPATIENT_CLINIC_OR_DEPARTMENT_OTHER): Payer: BC Managed Care – PPO

## 2011-12-27 ENCOUNTER — Encounter: Payer: Self-pay | Admitting: Radiation Oncology

## 2011-12-27 ENCOUNTER — Ambulatory Visit (HOSPITAL_BASED_OUTPATIENT_CLINIC_OR_DEPARTMENT_OTHER): Payer: BC Managed Care – PPO

## 2011-12-27 ENCOUNTER — Ambulatory Visit (HOSPITAL_BASED_OUTPATIENT_CLINIC_OR_DEPARTMENT_OTHER): Payer: BC Managed Care – PPO | Admitting: Nurse Practitioner

## 2011-12-27 ENCOUNTER — Ambulatory Visit
Admission: RE | Admit: 2011-12-27 | Discharge: 2011-12-27 | Disposition: A | Discharge status: Home / Self Care | Payer: BC Managed Care – PPO | Source: Ambulatory Visit | Attending: Radiation Oncology | Admitting: Radiation Oncology

## 2011-12-27 VITALS — BP 134/69 | HR 92 | Temp 97.9°F | Ht 64.0 in | Wt 145.5 lb

## 2011-12-27 VITALS — BP 145/72 | HR 95 | Resp 18 | Wt 145.7 lb

## 2011-12-27 DIAGNOSIS — Z5111 Encounter for antineoplastic chemotherapy: Secondary | ICD-10-CM

## 2011-12-27 DIAGNOSIS — C211 Malignant neoplasm of anal canal: Secondary | ICD-10-CM

## 2011-12-27 DIAGNOSIS — L538 Other specified erythematous conditions: Secondary | ICD-10-CM

## 2011-12-27 DIAGNOSIS — C210 Malignant neoplasm of anus, unspecified: Secondary | ICD-10-CM

## 2011-12-27 DIAGNOSIS — L589 Radiodermatitis, unspecified: Secondary | ICD-10-CM

## 2011-12-27 DIAGNOSIS — D649 Anemia, unspecified: Secondary | ICD-10-CM

## 2011-12-27 LAB — COMPREHENSIVE METABOLIC PANEL
AST: 19 U/L (ref 0–37)
Albumin: 3.3 g/dL — ABNORMAL LOW (ref 3.5–5.2)
Alkaline Phosphatase: 63 U/L (ref 39–117)
BUN: 9 mg/dL (ref 6–23)
Creatinine, Ser: 0.63 mg/dL (ref 0.50–1.10)
Glucose, Bld: 102 mg/dL — ABNORMAL HIGH (ref 70–99)
Potassium: 3.5 mEq/L (ref 3.5–5.3)
Total Bilirubin: 0.2 mg/dL — ABNORMAL LOW (ref 0.3–1.2)

## 2011-12-27 LAB — CBC WITH DIFFERENTIAL/PLATELET
Basophils Absolute: 0 10*3/uL (ref 0.0–0.1)
EOS%: 6.6 % (ref 0.0–7.0)
Eosinophils Absolute: 0.2 10*3/uL (ref 0.0–0.5)
HGB: 8.8 g/dL — ABNORMAL LOW (ref 11.6–15.9)
LYMPH%: 10.6 % — ABNORMAL LOW (ref 14.0–49.7)
MCH: 28.9 pg (ref 25.1–34.0)
MCV: 88.5 fL (ref 79.5–101.0)
MONO%: 16.6 % — ABNORMAL HIGH (ref 0.0–14.0)
Platelets: 210 10*3/uL (ref 145–400)
RDW: 16.5 % — ABNORMAL HIGH (ref 11.2–14.5)

## 2011-12-27 MED ORDER — ONDANSETRON 8 MG/50ML IVPB (CHCC)
8.0000 mg | Freq: Once | INTRAVENOUS | Status: AC
  Administered 2011-12-27: 8 mg via INTRAVENOUS

## 2011-12-27 MED ORDER — HEPARIN SOD (PORK) LOCK FLUSH 100 UNIT/ML IV SOLN
500.0000 [IU] | Freq: Once | INTRAVENOUS | Status: AC | PRN
  Administered 2011-12-27: 500 [IU]
  Filled 2011-12-27: qty 5

## 2011-12-27 MED ORDER — SODIUM CHLORIDE 0.9 % IV SOLN
Freq: Once | INTRAVENOUS | Status: AC
  Administered 2011-12-27: 17:00:00 via INTRAVENOUS

## 2011-12-27 MED ORDER — SODIUM CHLORIDE 0.9 % IJ SOLN
10.0000 mL | INTRAMUSCULAR | Status: DC | PRN
  Administered 2011-12-27: 10 mL
  Filled 2011-12-27: qty 10

## 2011-12-27 MED ORDER — DEXAMETHASONE SODIUM PHOSPHATE 10 MG/ML IJ SOLN
10.0000 mg | Freq: Once | INTRAMUSCULAR | Status: AC
Start: 2011-12-27 — End: 2011-12-27
  Administered 2011-12-27: 10 mg via INTRAVENOUS

## 2011-12-27 MED ORDER — SODIUM CHLORIDE 0.9 % IJ SOLN
10.0000 mL | Freq: Once | INTRAMUSCULAR | Status: DC
  Filled 2011-12-27: qty 10

## 2011-12-27 MED ORDER — SILVER SULFADIAZINE 1 % EX CREA
TOPICAL_CREAM | Freq: Every day | CUTANEOUS | Status: DC
  Administered 2011-12-27: 15:00:00 via TOPICAL

## 2011-12-27 MED ORDER — SODIUM CHLORIDE 0.9 % IV SOLN
1000.0000 mg/m2/d | INTRAVENOUS | Status: DC
  Administered 2011-12-27: 6950 mg via INTRAVENOUS
  Filled 2011-12-27: qty 139

## 2011-12-27 MED ORDER — HEPARIN SOD (PORK) LOCK FLUSH 100 UNIT/ML IV SOLN
250.0000 [IU] | Freq: Once | INTRAVENOUS | Status: DC
  Filled 2011-12-27: qty 5

## 2011-12-27 MED ORDER — MITOMYCIN CHEMO IV INJECTION 20 MG
10.0000 mg/m2 | Freq: Once | INTRAVENOUS | Status: AC
  Administered 2011-12-27: 17.5 mg via INTRAVENOUS
  Filled 2011-12-27: qty 35

## 2011-12-27 NOTE — Progress Notes (Signed)
Patient presents to the clinic today for under treat visit with Dr. Dayton Scrape. Patient is alert and oriented person, place, and time. No distress noted. Steady gait noted. Pleasant affect noted. Patient reports constant burning rectal and perineal pain 10 on a scale of 0-10. Encourage patient to take oxycontin 10 mg every 12 hours and oxycodone 5 mg every four hours around the clock. Provided patient with another jar of silvadene. Encourage patient continue sitz baths. Provided patient with pillow to relieve pressure on bottom. Patient reports crying the weekend in such pain. Patient concerned she is scheduled for chemo today but has one more week of radiation. Reported all findings to Dr. Dayton Scrape.

## 2011-12-27 NOTE — Progress Notes (Signed)
Weekly Management Note:  Site:Anal canal/regional LNs Current Dose:  3780  cGy Projected Dose: 5400  cGy  Narrative: The patient is seen today for routine under treatment assessment. CBCT/MVCT images/port films were reviewed. The chart was reviewed.   She has been in a great deal of pain (10/10). Over the weekend, she talk to the pharmacist who suggested that she increase her OxyContin to 20 mg by mouth twice a day (2 pills twice a day) area she did not take any oxycodone for breakthrough pain thinking that she can only take OxyContin. She is scheduled for more infusion 5-FU this week.  Physical Examination:  Filed Vitals:   12/27/11 1500  BP: 145/72  Pulse: 95  Resp: 18  .  Weight: 145 lb 11.2 oz (66.089 kg). There is a moist perianal desquamation with areas of excoriated papules along the buttocks bilaterally. This does not appear to be shingles.  CBC from February 25 remarkable for white blood count 3.0 K. hemoglobin 8.8, platelet count 210 K.  Impression: She has severe symptomatology secondary to perianal dermatitis/ano- rectal mucositis. We need to monitor her hemoglobin. Ideally, it should be higher than 9-10.  Plan: Continue radiation therapy as planned. We're increasing her OxyContin to 20 mg by mouth twice a day and may increase it to 20 mg by mouth 3 times a day by the end of the week. I will see her again in 2 days to mark her progress. She also knows that she can take oxycodone when necessary breakthrough pain. She may require transfusion if her hemoglobin drops any further.

## 2011-12-27 NOTE — Patient Instructions (Signed)
Drink lots of fluids.  Return Wed. For P.I.C.C. Flush call prn if any changes in symptoms.

## 2011-12-27 NOTE — Telephone Encounter (Signed)
Kem Boroughs ,RN called asking for [facility],  saying "pt's appt for Radiation was at 1405, then was to see Nurse Practioner Lonna Cobb at 230 pm, then back to see Dr. Dayton Scrape at 3pm, patient  missed 230 pm appt upstairs, and couldn't get her chemo treatment for 315 pm, asked to let Dr. Nettie Elm Nurse know, informed Reinaldo Berber RN,and Dr. Dayton Scrape 4:27 PM

## 2011-12-27 NOTE — Progress Notes (Signed)
OFFICE PROGRESS NOTE  Interval history:  [name] returns as scheduled. She reports increased pain at the rectum and genitalia. OxyContin dose was increased to 20 mg every 12 hours earlier today. She is taking oxycodone as needed. She denies mouth sores. No nausea or vomiting. Bowels moving regularly with the aid of a stool softener.   Objective: Blood pressure 134/69, pulse 92, temperature 97.9 F (36.6 C), temperature source Oral, height 5\' 4"  (1.626 m), weight 145 lb 8 oz (65.998 kg).  Oropharynx is without thrush or ulceration. Lungs are clear. Regular cardiac rhythm. Abdomen is soft and nontender. No hepatomegaly. Extremities are without edema. Marked erythema with areas of superficial desquamation extending from the pubic region to the perineum to the upper gluteal fold.  Lab Results: Lab Results  Component Value Date   WBC 3.0* 12/27/2011   HGB 8.8* 12/27/2011   HCT 27.0* 12/27/2011   MCV 88.5 12/27/2011   PLT 210 12/27/2011    Chemistry:    Chemistry      Component Value Date/Time   NA 141 11/08/2011 1455   K 4.4 11/08/2011 1455   CL 104 11/08/2011 1455   CO2 30 11/08/2011 1455   BUN 12 11/08/2011 1455   CREATININE 0.66 11/08/2011 1455      Component Value Date/Time   CALCIUM 9.8 11/08/2011 1455   ALKPHOS 103 11/08/2011 1455   AST 20 11/08/2011 1455   ALT 14 11/08/2011 1455   BILITOT 0.2* 11/08/2011 1455       Studies/Results: Ir Fluoro Guide Cv Line Right  11/29/2011  *RADIOLOGY REPORT*  Clinical Data: 67 year old with anal cancer.  PICC LINE PLACEMENT WITH ULTRASOUND AND FLUOROSCOPIC  GUIDANCE  Fluoroscopy Time: 0.1 minutes.  The right arm was prepped with chlorhexidine, draped in the usual sterile fashion using maximum barrier technique (cap and mask, sterile gown, sterile gloves, large sterile sheet, hand hygiene and cutaneous antisepsis) and infiltrated locally with 1% Lidocaine.  Ultrasound demonstrated patency of the right basilic vein, and this was documented with an image.  Under  real-time ultrasound guidance, this vein was accessed with a 21 gauge micropuncture needle and image documentation was performed.  The needle was exchanged over a guidewire for a peel-away sheath through which a five [place] dual lumen PICC trimmed to 39 cm was advanced, positioned with its tip at the lower SVC/right atrial junction.  Fluoroscopy during the procedure and fluoro spot radiograph confirms appropriate catheter position.  The catheter was flushed, secured to the skin with Prolene sutures, and covered with a sterile dressing.  Complications:  None  IMPRESSION: Successful right arm PICC line placement with ultrasound and fluoroscopic guidance.  The catheter is ready for use.  Original Report Authenticated By: Richarda Overlie, M.D.   Ir US Guide Vasc Access Right  11/29/2011  *RADIOLOGY REPORT*  Clinical Data: 66 year old with anal cancer.  PICC LINE PLACEMENT WITH ULTRASOUND AND FLUOROSCOPIC  GUIDANCE  Fluoroscopy Time: 0.1 minutes.  The right arm was prepped with chlorhexidine, draped in the usual sterile fashion using maximum barrier technique (cap and mask, sterile gown, sterile gloves, large sterile sheet, hand hygiene and cutaneous antisepsis) and infiltrated locally with 1% Lidocaine.  Ultrasound demonstrated patency of the right basilic vein, and this was documented with an image.  Under real-time ultrasound guidance, this vein was accessed with a 21 gauge micropuncture needle and image documentation was performed.  The needle was exchanged over a guidewire for a peel-away sheath through which a five [place] dual lumen PICC trimmed to  39 cm was advanced, positioned with its tip at the lower SVC/right atrial junction.  Fluoroscopy during the procedure and fluoro spot radiograph confirms appropriate catheter position.  The catheter was flushed, secured to the skin with Prolene sutures, and covered with a sterile dressing.  Complications:  None  IMPRESSION: Successful right arm PICC line placement with  ultrasound and fluoroscopic guidance.  The catheter is ready for use.  Original Report Authenticated By: Richarda Overlie, M.D.    Medications: I have reviewed the patient's current medications.  Assessment/Plan:  1. Poorly differentiated carcinoma, basaloid squamous cell carcinoma, of the anal canal and posterior vagina. Staging CT scans of the abdomen and pelvis on 11/15/2011 with suspicious perirectal lymph node and no evidence of distant metastatic disease. She began radiation 11/29/2011. She completed cycle 1 mitomycin and 5 fluorouracil 11/29/2011. 2. Rectal bleeding secondary to #1, improved. 3. Erythema and superficial desquamation related to radiation. She is applying Silvadene cream per radiation oncology. OxyContin dose was increased to 20 mg every 12 hours earlier today. She is taking oxycodone as needed. 4. Anemia, progressive. 5. Status post placement right upper the PICC line 11/29/2011. The PICC line is being flushed on a Monday Wednesday Friday schedule.  Disposition-she continues daily radiation. Plan to proceed with cycle 2 mitomycin and 5 fluorouracil today as scheduled. She will return for a CBC and type and screen on 01/03/2012. We will see her in followup on 01/12/2012. She will contact the office in the interim with any problems. We specifically discussed poor pain control.  Patient seen with Dr. Truett Perna.     [name] ANP/GNP-BC

## 2011-12-28 ENCOUNTER — Telehealth: Payer: Self-pay | Admitting: Oncology

## 2011-12-28 ENCOUNTER — Ambulatory Visit
Admission: RE | Admit: 2011-12-28 | Discharge: 2011-12-28 | Disposition: A | Discharge status: Home / Self Care | Payer: BC Managed Care – PPO | Source: Ambulatory Visit | Attending: Radiation Oncology | Admitting: Radiation Oncology

## 2011-12-28 NOTE — Telephone Encounter (Signed)
called pt and informed her of appts for [phone]

## 2011-12-29 ENCOUNTER — Ambulatory Visit (HOSPITAL_BASED_OUTPATIENT_CLINIC_OR_DEPARTMENT_OTHER): Payer: BC Managed Care – PPO

## 2011-12-29 ENCOUNTER — Ambulatory Visit
Admission: RE | Admit: 2011-12-29 | Discharge: 2011-12-29 | Disposition: A | Discharge status: Home / Self Care | Payer: BC Managed Care – PPO | Source: Ambulatory Visit | Attending: Radiation Oncology | Admitting: Radiation Oncology

## 2011-12-29 ENCOUNTER — Encounter: Payer: Self-pay | Admitting: Radiation Oncology

## 2011-12-29 VITALS — Wt 152.2 lb

## 2011-12-29 DIAGNOSIS — C211 Malignant neoplasm of anal canal: Secondary | ICD-10-CM

## 2011-12-29 DIAGNOSIS — Z469 Encounter for fitting and adjustment of unspecified device: Secondary | ICD-10-CM

## 2011-12-29 DIAGNOSIS — C210 Malignant neoplasm of anus, unspecified: Secondary | ICD-10-CM

## 2011-12-29 MED ORDER — SODIUM CHLORIDE 0.9 % IJ SOLN
10.0000 mL | INTRAMUSCULAR | Status: DC | PRN
  Administered 2011-12-29: 10 mL via INTRAVENOUS
  Filled 2011-12-29: qty 10

## 2011-12-29 MED ORDER — HEPARIN SOD (PORK) LOCK FLUSH 100 UNIT/ML IV SOLN
500.0000 [IU] | Freq: Once | INTRAVENOUS | Status: AC
  Administered 2011-12-29: 500 [IU] via INTRAVENOUS
  Filled 2011-12-29: qty 5

## 2011-12-29 NOTE — Progress Notes (Signed)
Weekly Management Note:  Site:Anal canal/regional LNs Current Dose:  4140  cGy Projected Dose: 5400  cGy  Narrative: The patient is seen today for routine under treatment assessment. CBCT/MVCT images/port films were reviewed. The chart was reviewed. Bladder filling is suboptimal today, the bladder is only one half to two thirds full. She tells me that she has limited her by mouth intake because of dysuria/perineal burning related to her radiation therapy. She states that her perianal discomfort is improved taking 20 mg of OxyContin by mouth twice a day. She takes up to 4 oxycodone 5 mg immediate release tablets for breakthrough pain a day. She continues with her chemotherapy this week. Blood counts and been satisfactory.  Physical Examination: There were no vitals filed for this visit..  Weight: 152 lb 3.2 oz (69.037 kg). No change.  Impression: Tolerating radiation therapy well her, she does have a significant moist desquamation along her perianal region. Bladder filling is suboptimal and I encouraged her to improve her bladder filling.  Plan: Continue radiation therapy as planned. We may drop the last 1-2 treatments based on my examination early next week. She'll try to improve her bladder filling.

## 2011-12-29 NOTE — Progress Notes (Signed)
Pt alert and oriented x 3, 23/30 rad txs, pt states: when I void, it burns like fire and I put on silvadene after each void, she does remove before reapplying", when she has bowel movements 2 today, " 2 stool softners takes at night states patient, alert oriented x 3, pain is an "8"  On a 1/10 pain scale, took OxyContin 20 mg this am at 5am, and took her last Oxy IR 5mg  at 1pm today, has taken 1 sitz bath today, says "doubling up on my pain meds has helped some"" pt says bottom still raw and moist 2:53 PM

## 2011-12-30 ENCOUNTER — Ambulatory Visit
Admission: RE | Admit: 2011-12-30 | Discharge: 2011-12-30 | Disposition: A | Discharge status: Home / Self Care | Payer: BC Managed Care – PPO | Source: Ambulatory Visit | Attending: Radiation Oncology | Admitting: Radiation Oncology

## 2011-12-31 ENCOUNTER — Other Ambulatory Visit: Payer: Self-pay | Admitting: Nurse Practitioner

## 2011-12-31 ENCOUNTER — Ambulatory Visit: Payer: BC Managed Care – PPO

## 2011-12-31 ENCOUNTER — Ambulatory Visit
Admission: RE | Admit: 2011-12-31 | Discharge: 2011-12-31 | Disposition: A | Discharge status: Home / Self Care | Payer: BC Managed Care – PPO | Source: Ambulatory Visit | Attending: Radiation Oncology | Admitting: Radiation Oncology

## 2011-12-31 VITALS — BP 127/63 | HR 83 | Temp 99.4°F

## 2011-12-31 DIAGNOSIS — C211 Malignant neoplasm of anal canal: Secondary | ICD-10-CM

## 2011-12-31 MED ORDER — SODIUM CHLORIDE 0.9 % IV SOLN
1323.0000 mg | INTRAVENOUS | Status: AC
  Filled 2011-12-31: qty 26

## 2011-12-31 MED ORDER — SODIUM CHLORIDE 0.9 % IJ SOLN
10.0000 mL | INTRAMUSCULAR | Status: DC | PRN
  Filled 2011-12-31: qty 10

## 2011-12-31 NOTE — Progress Notes (Unsigned)
Patient presents for pump dc. Patient states that at 2300 on 12/30/11 pump began beeping and she took the batteries out. After putting new batteries in pump I noted there to be a remainder of 18.6 hours of infusion left before being complete. Pump restarted and settings confirmed. Patient to return on 01/01/12 at 0900 for pump dc and PICC DC. Patient verbalized understanding. MAW

## 2012-01-01 ENCOUNTER — Ambulatory Visit (HOSPITAL_BASED_OUTPATIENT_CLINIC_OR_DEPARTMENT_OTHER): Payer: BC Managed Care – PPO

## 2012-01-01 VITALS — BP 126/66 | HR 69 | Temp 99.0°F | Resp 20

## 2012-01-01 DIAGNOSIS — C211 Malignant neoplasm of anal canal: Secondary | ICD-10-CM

## 2012-01-01 MED ORDER — SODIUM CHLORIDE 0.9 % IJ SOLN
10.0000 mL | INTRAMUSCULAR | Status: DC | PRN
  Administered 2012-01-01: 10:00:00 via INTRAVENOUS
  Filled 2012-01-01: qty 10

## 2012-01-01 MED ORDER — HEPARIN SOD (PORK) LOCK FLUSH 100 UNIT/ML IV SOLN
500.0000 [IU] | Freq: Once | INTRAVENOUS | Status: AC | PRN
  Administered 2012-01-01: 500 [IU]
  Filled 2012-01-01: qty 5

## 2012-01-01 NOTE — Patient Instructions (Signed)
Patient aware of next appointment; discharged home with no complaints. 

## 2012-01-01 NOTE — Progress Notes (Signed)
Patient came in for pump d/c 01/01/2012; completed day for pump d/c 12/31/2011.  Patient states that she should have PICC line removed today; no orders to remove PICC line today; patient back to office 01/03/2012 (monday); left message with desk nurse to ask physician for orders for PICC line d/c.

## 2012-01-03 ENCOUNTER — Other Ambulatory Visit: Payer: Self-pay | Admitting: *Deleted

## 2012-01-03 ENCOUNTER — Ambulatory Visit
Admission: RE | Admit: 2012-01-03 | Discharge: 2012-01-03 | Disposition: A | Discharge status: Home / Self Care | Payer: BC Managed Care – PPO | Source: Ambulatory Visit | Attending: Radiation Oncology | Admitting: Radiation Oncology

## 2012-01-03 ENCOUNTER — Ambulatory Visit (HOSPITAL_BASED_OUTPATIENT_CLINIC_OR_DEPARTMENT_OTHER): Payer: BC Managed Care – PPO

## 2012-01-03 DIAGNOSIS — C211 Malignant neoplasm of anal canal: Secondary | ICD-10-CM

## 2012-01-03 DIAGNOSIS — C210 Malignant neoplasm of anus, unspecified: Secondary | ICD-10-CM

## 2012-01-03 MED ORDER — HEPARIN SOD (PORK) LOCK FLUSH 100 UNIT/ML IV SOLN
500.0000 [IU] | Freq: Once | INTRAVENOUS | Status: AC
  Administered 2012-01-03: 500 [IU] via INTRAVENOUS
  Filled 2012-01-03: qty 5

## 2012-01-03 MED ORDER — SODIUM CHLORIDE 0.9 % IJ SOLN
10.0000 mL | INTRAMUSCULAR | Status: DC | PRN
  Administered 2012-01-03: 10 mL via INTRAVENOUS
  Filled 2012-01-03: qty 10

## 2012-01-03 MED ORDER — SILVER SULFADIAZINE 1 % EX CREA
TOPICAL_CREAM | Freq: Every day | CUTANEOUS | Status: DC
  Administered 2012-01-03: 14:00:00 via TOPICAL

## 2012-01-03 NOTE — Progress Notes (Signed)
Weekly Management Note:  Site:Anal canal/regional LNs Current Dose:  4680  cGy Projected Dose: 5040  cGy  Narrative: The patient is seen today for routine under treatment assessment. CBCT/MVCT images/port films were reviewed. The chart was reviewed.  She has satisfactory bladder filling today. She is quite miserable today. She is currently taking 20 mg of OxyContin twice a day in addition to oxycodone immediate release when necessary. She has a lot of burning of the perineum on urination.  Physical Examination:  Filed Vitals:   01/03/12 1340  BP: 150/84  Pulse: 102  Temp: 99.1 F (37.3 C)  .  Weight: 144 lb 12.8 oz (65.681 kg). There is a moist desquamation along her inguinal folds, and also perianal/perineal region.  Impression: The patient is having significant pain along with a significant moist desquamation. This is not entirely unexpected. I will drop her last 2 fractions of radiation therapy in finish her course of treatment at 5040 cGy in 28 sessions. The rationale for a target dose of 5400 cGy was because of vaginal involvement. Even though her biopsy was positive along the vaginal mucosa there was no ulceration. I'm having her apply zinc oxide ointment in the morning to act as a barrier dressing and Silvadene in the evening. I've also requested that she increase her OxyContin to 20 mg by mouth 3 times a day. Plan: Continue radiation therapy as planned. She'll finish after her treatment this Wednesday.

## 2012-01-03 NOTE — Progress Notes (Signed)
Here for assessment prior to radiation treatment as patient has been having  Frequency of at least hourly sensation, urgency and burning on urination.On urination pt. only able to void scant amounts. Pt. Is very uncomfortable and would like to forego treatment today.Symptoms started on Friday.

## 2012-01-04 ENCOUNTER — Ambulatory Visit
Admission: RE | Admit: 2012-01-04 | Discharge: 2012-01-04 | Disposition: A | Discharge status: Home / Self Care | Payer: BC Managed Care – PPO | Source: Ambulatory Visit | Attending: Radiation Oncology | Admitting: Radiation Oncology

## 2012-01-05 ENCOUNTER — Ambulatory Visit
Admission: RE | Admit: 2012-01-05 | Discharge: 2012-01-05 | Disposition: A | Discharge status: Home / Self Care | Payer: BC Managed Care – PPO | Source: Ambulatory Visit | Attending: Radiation Oncology | Admitting: Radiation Oncology

## 2012-01-06 ENCOUNTER — Other Ambulatory Visit: Payer: Self-pay | Admitting: Radiation Oncology

## 2012-01-06 ENCOUNTER — Encounter: Payer: Self-pay | Admitting: Radiation Oncology

## 2012-01-06 ENCOUNTER — Ambulatory Visit: Payer: BC Managed Care – PPO

## 2012-01-06 MED ORDER — OXYCODONE HCL 5 MG PO TABS: 5.0000 mg | ORAL_TABLET | ORAL | Status: DC | PRN

## 2012-01-06 MED ORDER — OXYCODONE HCL 20 MG PO TB12: 20.0000 mg | ORAL_TABLET | Freq: Three times a day (TID) | ORAL | Status: DC

## 2012-01-06 MED ORDER — OXYCODONE HCL 5 MG PO CAPS: 5.0000 mg | ORAL_CAPSULE | ORAL | Status: DC | PRN

## 2012-01-06 NOTE — Progress Notes (Signed)
[facility] Radiation Oncology  Name:[name]  Date:01/06/2012           ZOX:[id number] DOB:14-Mar-1946   Status:outpatient    CC: [name], MD, MD  Dr. Claud Kelp, Dr. Mancel Bale  REFERRING PHYSICIAN: Dr. Claud Kelp     DIAGNOSIS: Stage IIIB (T4, N0, M0) squamous cell carcinoma of the anal canal   INDICATION FOR TREATMENT: Curative   TREATMENT DATES: 11/29/2011 through 01/05/2012                          SITE/DOSE: Joesph Fillers canal 5040 cGy 28 sessions, regional lymph nodes 4200 cGy 28 sessions                           BEAMS/ENERGY:   6 MV photons helical IMRT Tomotherapy                NARRATIVE:   As expected, the patient had a difficult time during her chemoradiation. She had a severe perineal desquamation which prompted me to drop her last 2 treatments. She required 20 mg of MS Contin 3 times a day in addition to oxycodone 5 mg when necessary to control her pain. She was treated with zinc oxide and Silvadene antibiotic cream. Her blood counts remain satisfactory for treatment.                         PLAN: She will return to see me for a followup visit in one week. I'm delaying her return to work until at least early April.

## 2012-01-07 ENCOUNTER — Ambulatory Visit: Payer: BC Managed Care – PPO

## 2012-01-12 ENCOUNTER — Telehealth: Payer: Self-pay | Admitting: Oncology

## 2012-01-12 ENCOUNTER — Encounter: Payer: Self-pay | Admitting: Radiation Oncology

## 2012-01-12 ENCOUNTER — Ambulatory Visit (HOSPITAL_BASED_OUTPATIENT_CLINIC_OR_DEPARTMENT_OTHER): Payer: BC Managed Care – PPO | Admitting: Nurse Practitioner

## 2012-01-12 ENCOUNTER — Ambulatory Visit
Admission: RE | Admit: 2012-01-12 | Discharge: 2012-01-12 | Disposition: A | Discharge status: Home / Self Care | Payer: BC Managed Care – PPO | Source: Ambulatory Visit | Attending: Radiation Oncology | Admitting: Radiation Oncology

## 2012-01-12 ENCOUNTER — Other Ambulatory Visit (HOSPITAL_BASED_OUTPATIENT_CLINIC_OR_DEPARTMENT_OTHER): Payer: BC Managed Care – PPO | Admitting: Lab

## 2012-01-12 VITALS — BP 122/69 | HR 102 | Temp 98.3°F | Resp 18 | Wt 142.9 lb

## 2012-01-12 VITALS — BP 120/70 | HR 94 | Temp 97.9°F | Ht 64.0 in | Wt 142.0 lb

## 2012-01-12 DIAGNOSIS — D649 Anemia, unspecified: Secondary | ICD-10-CM

## 2012-01-12 DIAGNOSIS — K130 Diseases of lips: Secondary | ICD-10-CM | POA: Insufficient documentation

## 2012-01-12 DIAGNOSIS — K625 Hemorrhage of anus and rectum: Secondary | ICD-10-CM

## 2012-01-12 DIAGNOSIS — C211 Malignant neoplasm of anal canal: Secondary | ICD-10-CM

## 2012-01-12 DIAGNOSIS — L988 Other specified disorders of the skin and subcutaneous tissue: Secondary | ICD-10-CM | POA: Insufficient documentation

## 2012-01-12 LAB — CBC WITH DIFFERENTIAL/PLATELET
Basophils Absolute: 0 10*3/uL (ref 0.0–0.1)
Eosinophils Absolute: 0 10*3/uL (ref 0.0–0.5)
HCT: 23.4 % — ABNORMAL LOW (ref 34.8–46.6)
HGB: 7.9 g/dL — ABNORMAL LOW (ref 11.6–15.9)
LYMPH%: 7.8 % — ABNORMAL LOW (ref 14.0–49.7)
MONO#: 0.2 10*3/uL (ref 0.1–0.9)
NEUT#: 2.3 10*3/uL (ref 1.5–6.5)
Platelets: 153 10*3/uL (ref 145–400)
RBC: 2.62 10*6/uL — ABNORMAL LOW (ref 3.70–5.45)
WBC: 2.7 10*3/uL — ABNORMAL LOW (ref 3.9–10.3)

## 2012-01-12 MED ORDER — SILVER SULFADIAZINE 1 % EX CREA
TOPICAL_CREAM | Freq: Every day | CUTANEOUS | Status: DC
  Administered 2012-01-12: 10:00:00 via TOPICAL

## 2012-01-12 NOTE — Telephone Encounter (Signed)
Gv pt appt for march2013 °

## 2012-01-12 NOTE — Progress Notes (Signed)
Patient presents to the clinic today unaccompanied for a one week follow up appointment with Dr. Dayton Scrape. Patient is alert and oriented to person, place, and time. No distress noted. Steady gait noted. Pleasant affect noted. Patient reports constant burning perineal pain 8 on a scale of 0-10. Patient in such pain she is unable to sit still. Patient reports having only a small bowel movement this morning. Patient denies diarrhea. Patient reports that she has no appetite. Patient reports "food don't taste like they use too." Healing sores noted around the patient's mouth. Reported all findings to Dr. Dayton Scrape.

## 2012-01-12 NOTE — Progress Notes (Signed)
OFFICE PROGRESS NOTE  Interval history:  [name] returns as scheduled. She continues to have pain at the perineum/perianal region. She is currently taking OxyContin 40 mg twice daily with oxycodone for breakthrough pain. She estimates she is taking the oxycodone every 4 hours. Appetite is poor. No nausea or vomiting. No mouth sores. She had diarrhea last week. Bowels now moving normally.   Objective: Blood pressure 120/70, pulse 94, temperature 97.9 F (36.6 C), temperature source Oral, height 5\' 4"  (1.626 m), weight 142 lb (64.411 kg).  Oropharynx is without thrush or ulceration. Lungs are clear. Regular cardiac rhythm. Abdomen soft nontender. No hepatomegaly. Extremities without edema. Calves soft nontender. Moist desquamation at the perineum/perianal region. The pubic region, bilateral groin regions and buttocks are erythematous without skin breakdown.  Lab Results: Lab Results  Component Value Date   WBC 2.7* 01/12/2012   HGB 7.9* 01/12/2012   HCT 23.4* 01/12/2012   MCV 89.3 01/12/2012   PLT 153 01/12/2012    Chemistry:    Chemistry      Component Value Date/Time   NA 139 12/27/2011 1356   K 3.5 12/27/2011 1356   CL 104 12/27/2011 1356   CO2 28 12/27/2011 1356   BUN 9 12/27/2011 1356   CREATININE 0.63 12/27/2011 1356      Component Value Date/Time   CALCIUM 9.2 12/27/2011 1356   ALKPHOS 63 12/27/2011 1356   AST 19 12/27/2011 1356   ALT 14 12/27/2011 1356   BILITOT 0.2* 12/27/2011 1356       Studies/Results: No results found.  Medications: I have reviewed the patient's current medications.  Assessment/Plan:  1. Poorly differentiated carcinoma, basaloid squamous cell carcinoma, of the anal canal and posterior vagina. Staging CT scans of the abdomen and pelvis on 11/15/2011 with suspicious perirectal lymph node and no evidence of distant metastatic disease. She began radiation 11/29/2011. She completed cycle 1 mitomycin and 5 fluorouracil 11/29/2011. She completed cycle 2  beginning 12/27/2011. She completed the course of radiation on 01/05/2012. 2. Rectal bleeding secondary to #1, improved. 3. Erythema and desquamation related to radiation. She is applying Silvadene cream per radiation oncology. OxyContin dose was recently increased to 40 mg every 12 hours. She is taking oxycodone as needed. 4. Anemia, progressive. 5. Status post placement right upper the PICC line 11/29/2011. The PICC line has been removed.  Disposition-[name] has completed the course of radiation and chemotherapy. She has significant skin toxicity. She will continue the OxyContin and oxycodone. We will see her in followup on 01/25/2012 with a repeat CBC. She will contact the office in the interim with any problems.  Plan reviewed with Dr. Truett Perna.  Lonna Cobb ANP/GNP-BC

## 2012-01-12 NOTE — Progress Notes (Signed)
Followup note:  Ms. Lehenbauer visits today approximately one week following completion of chemoradiation in the management of her T4 N0 squamous cell carcinoma of the anal canal. Despite increasing her OxyContin to 20 mg by mouth 3 times a day she still having significant perineal discomfort. She takes oxycodone for breakthrough pain every 4-6 hours. Her diarrhea is improved after taking Imodium. She has significant burning with urination and bowel movements as expected. She is delaying her return to work until April 2. She is using Silvadene cream along her perineum at night and Desitin/zinc oxide as a barrier dressing during the day. Her taste remains diminished. She is scheduled for blood work up stairs today.  Physical examination: Head and neck examination: There a few small healing ulcers along her lips. On inspection of the oral cavity there is no candidiasis or stomatitis. There is a moist desquamation along her perineum as expected. There is partial reepithelialization.  Impression: Satisfactory progress, but suboptimal pain control. I told her that she can increase her OxyContin 20 mg tabs to 2 tabs in the morning and 2 tabs in the evening (four tabs today) in addition to her oxycodone IR for breakthrough pain.  Plan: Followup visit with me in 2 weeks.

## 2012-01-20 ENCOUNTER — Telehealth: Payer: Self-pay | Admitting: Radiation Oncology

## 2012-01-20 DIAGNOSIS — C211 Malignant neoplasm of anal canal: Secondary | ICD-10-CM

## 2012-01-20 NOTE — Telephone Encounter (Signed)
The patient called stating that she was getting up at night with anorectal pain. She continues to have severe dysuria. We increased her OxyContin to 40 mg by mouth twice a day, and I told her that she can increase this to up to 40 mg by mouth 3 times a day ( 5-6 pills a day). She knows that she has oxycodone to take when necessary breakthrough pain. She has enough OxyContin to last until next Tuesday when she sees me for a followup visit.

## 2012-01-21 ENCOUNTER — Telehealth: Payer: Self-pay | Admitting: Radiation Oncology

## 2012-01-21 NOTE — Telephone Encounter (Signed)
Received call from patient this morning reference constipation. Patient reports that she has not had a bowel movement in three days despite taking fiber supplements and stool softener. Encouraged patient to drink two cups of warm black coffee. Instructed patient if she had not had a bowel movement following lunch to take magnesium citrate. These suggestions were made knowing patient is on a tight budget. Instructed patient to begin taking stool softener twice daily once she is evacuated. Will call patient later today to check on her status. Patient verbalized understanding and expressed thanks.

## 2012-01-25 ENCOUNTER — Ambulatory Visit (HOSPITAL_BASED_OUTPATIENT_CLINIC_OR_DEPARTMENT_OTHER): Payer: BC Managed Care – PPO

## 2012-01-25 ENCOUNTER — Ambulatory Visit (HOSPITAL_BASED_OUTPATIENT_CLINIC_OR_DEPARTMENT_OTHER): Payer: BC Managed Care – PPO | Admitting: Nurse Practitioner

## 2012-01-25 ENCOUNTER — Ambulatory Visit
Admission: RE | Admit: 2012-01-25 | Discharge: 2012-01-25 | Disposition: A | Discharge status: Home / Self Care | Payer: BC Managed Care – PPO | Source: Ambulatory Visit | Attending: Radiation Oncology | Admitting: Radiation Oncology

## 2012-01-25 ENCOUNTER — Other Ambulatory Visit: Payer: BC Managed Care – PPO | Admitting: Lab

## 2012-01-25 ENCOUNTER — Encounter: Payer: Self-pay | Admitting: Radiation Oncology

## 2012-01-25 VITALS — BP 135/73 | HR 82 | Temp 98.3°F | Ht 64.0 in | Wt 134.5 lb

## 2012-01-25 VITALS — BP 142/80 | HR 81 | Temp 98.2°F | Resp 18 | Wt 134.7 lb

## 2012-01-25 DIAGNOSIS — C211 Malignant neoplasm of anal canal: Secondary | ICD-10-CM

## 2012-01-25 DIAGNOSIS — E860 Dehydration: Secondary | ICD-10-CM

## 2012-01-25 LAB — CBC WITH DIFFERENTIAL/PLATELET
Basophils Absolute: 0 10*3/uL (ref 0.0–0.1)
Eosinophils Absolute: 0 10*3/uL (ref 0.0–0.5)
HGB: 9.1 g/dL — ABNORMAL LOW (ref 11.6–15.9)
MCV: 92.1 fL (ref 79.5–101.0)
MONO#: 0.3 10*3/uL (ref 0.1–0.9)
NEUT#: 1.9 10*3/uL (ref 1.5–6.5)
RDW: 24.9 % — ABNORMAL HIGH (ref 11.2–14.5)
WBC: 2.7 10*3/uL — ABNORMAL LOW (ref 3.9–10.3)
lymph#: 0.5 10*3/uL — ABNORMAL LOW (ref 0.9–3.3)

## 2012-01-25 LAB — COMPREHENSIVE METABOLIC PANEL
Albumin: 3.4 g/dL — ABNORMAL LOW (ref 3.5–5.2)
BUN: 6 mg/dL (ref 6–23)
CO2: 26 mEq/L (ref 19–32)
Calcium: 9.5 mg/dL (ref 8.4–10.5)
Chloride: 103 mEq/L (ref 96–112)
Glucose, Bld: 91 mg/dL (ref 70–99)
Potassium: 3.3 mEq/L — ABNORMAL LOW (ref 3.5–5.3)
Total Protein: 7 g/dL (ref 6.0–8.3)

## 2012-01-25 MED ORDER — SODIUM CHLORIDE 0.9 % IV SOLN
INTRAVENOUS | Status: DC
  Administered 2012-01-25: 15:00:00 via INTRAVENOUS

## 2012-01-25 NOTE — Progress Notes (Signed)
OFFICE PROGRESS NOTE  Interval history:  [name] returns as scheduled. The pain she was experiencing at the time of her last visit has significantly improved. She has discontinued both OxyContin and oxycodone. She took a single dose of ibuprofen 800 mg recently. She reports becoming constipated on 01/21/2012. She drinks 2 cups of "black coffee" and a bottle of magnesium citrate. She subsequently had multiple black stools. She had a "normal" bowel movement this morning. The stool was brown. She denies nausea/vomiting. No mouth sores. Her main complaint is weakness over the past 4 days. Appetite has been poor. She denies dizziness or lightheadedness. She recently discontinued Cymbalta and began Zoloft.   Objective: Blood pressure 135/73, pulse 82, temperature 98.3 F (36.8 C), temperature source Oral, height 5\' 4"  (1.626 m), weight 134 lb 8 oz (61.009 kg).  Oropharynx is without thrush or ulceration. Mucous membranes appear moist. Lungs are clear. Regular cardiac rhythm. Abdomen is soft and nontender. No hepatomegaly. Extremities are without edema. Skin turgor appears normal. The pubic region is mildly erythematous. There is also mild erythema at the perineum/perianal region. There is one small area of skin breakdown.  Lab Results: Lab Results  Component Value Date   WBC 2.7* 01/25/2012   HGB 9.1* 01/25/2012   HCT 27.7* 01/25/2012   MCV 92.1 01/25/2012   PLT 258 01/25/2012    Chemistry:    Chemistry      Component Value Date/Time   NA 139 12/27/2011 1356   K 3.5 12/27/2011 1356   CL 104 12/27/2011 1356   CO2 28 12/27/2011 1356   BUN 9 12/27/2011 1356   CREATININE 0.63 12/27/2011 1356      Component Value Date/Time   CALCIUM 9.2 12/27/2011 1356   ALKPHOS 63 12/27/2011 1356   AST 19 12/27/2011 1356   ALT 14 12/27/2011 1356   BILITOT 0.2* 12/27/2011 1356       Studies/Results: No results found.  Medications: I have reviewed the patient's current  medications.  Assessment/Plan:  1. Poorly differentiated carcinoma, basaloid squamous cell carcinoma, of the anal canal and posterior vagina. Staging CT scans of the abdomen and pelvis on 11/15/2011 with suspicious perirectal lymph node and no evidence of distant metastatic disease. She began radiation 11/29/2011. She completed cycle 1 mitomycin and 5 fluorouracil 11/29/2011. She completed cycle 2 beginning 12/27/2011. She completed the course of radiation on 01/05/2012. 2. Rectal bleeding secondary to #1, improved. 3. Erythema and desquamation related to radiation. Significantly improved. She has discontinued OxyContin and oxycodone. 4. Anemia, stable. 5. Status post placement right upper the PICC line 11/29/2011. The PICC line has been removed. 6. "Weakness". 7. Depression. She recently discontinued Cymbalta and began Zoloft.  Disposition-she may be mildly dehydrated. She will receive 1 L of IV fluids in the office today. We are also checking a chemistry panel. She will return for a followup visit in 2 weeks. She will contact the office in the interim with any problems.  Plan reviewed with Dr. Truett Perna.  Lonna Cobb ANP/GNP-BC

## 2012-01-25 NOTE — Progress Notes (Signed)
Followup note:  The patient returns today approximately 3 weeks following completion of chemoradiation in the management of her T4 N0 squamous cell carcinoma of the anal canal. She field had a bowel movement for him is 5 days and took mag citrate followed by a large bowel movement which was dark in color. She stopped her narcotics and her pain is now 4/10. Her major complaint is that of fatigue. Her weight is down to 134 pounds/8 ounces an 8 pound weight loss over the past 2 weeks. Her hemoglobin was 7.92 weeks ago and this has risen to 9.1 from earlier today. She was given IV fluids this afternoon.  Physical examination: Alert and oriented.  Vital signs:  Wt Readings from Last 3 Encounters:  01/25/12 134 lb 11.2 oz (61.1 kg)  01/25/12 134 lb 8 oz (61.009 kg)  01/12/12 142 lb (64.411 kg)   Temp Readings from Last 3 Encounters:  01/25/12 98.2 F (36.8 C) Oral  01/25/12 98.3 F (36.8 C) Oral  01/12/12 97.9 F (36.6 C) Oral   BP Readings from Last 3 Encounters:  01/25/12 142/80  01/25/12 135/73  01/12/12 120/70   Pulse Readings from Last 3 Encounters:  01/25/12 81  01/25/12 82  01/12/12 94   On inspection of the perineum she's had almost complete reepithelialization. I did not perform a digital anorectal exam.  Impression: Satisfactory progress except for fatigue. Her pain is much improved off narcotics. She has had almost complete reepithelialization.  Plan: Followup visit here in 2 weeks. It is hoped that she can return to work in 1-2 weeks.

## 2012-01-25 NOTE — Progress Notes (Signed)
Patient presents to the clinic today for a follow up appointment with Dr. Dayton Scrape. Patient is alert and oriented to person, place, and time. No distress noted. Steady gait noted. Pleasant affect noted. Patient reports that her rectal pain has improved. Patient reports rectal pain 4 on a scale of 0-10. Patient reports that her skin (perineal area) is getting better. Patient reports that she stopped taking pain medication because of the constipation it caused. Patient reports that she had a large bowel movement on Friday for the first time in five days. Patient reports that she has been weak ever since. Patient reports having a normal bowel movement today. Patient seen by her PCP today who switched her from cymbalta to zoloft. Patient continues to take buspar. Eight pound weight loss noted since 01/12/2012. Patient denies nausea, vomiting, headache, or dizziness. Patient reports she has no appetite. Patient reports her primary concern is weakness. Reported all finding to Dr. Dayton Scrape.

## 2012-01-26 ENCOUNTER — Other Ambulatory Visit: Payer: Self-pay | Admitting: *Deleted

## 2012-01-26 ENCOUNTER — Telehealth: Payer: Self-pay | Admitting: Oncology

## 2012-01-26 NOTE — Telephone Encounter (Signed)
called pt and informed her of appt on 03/28-04/09

## 2012-01-27 ENCOUNTER — Other Ambulatory Visit: Payer: Self-pay | Admitting: *Deleted

## 2012-01-27 ENCOUNTER — Ambulatory Visit (HOSPITAL_BASED_OUTPATIENT_CLINIC_OR_DEPARTMENT_OTHER): Payer: BC Managed Care – PPO

## 2012-01-27 DIAGNOSIS — C211 Malignant neoplasm of anal canal: Secondary | ICD-10-CM

## 2012-01-27 MED ORDER — SODIUM CHLORIDE 0.9 % IV SOLN
INTRAVENOUS | Status: DC
  Administered 2012-01-27: 10:00:00 via INTRAVENOUS

## 2012-02-07 ENCOUNTER — Encounter: Payer: Self-pay | Admitting: Radiation Oncology

## 2012-02-07 ENCOUNTER — Other Ambulatory Visit (HOSPITAL_BASED_OUTPATIENT_CLINIC_OR_DEPARTMENT_OTHER): Payer: BC Managed Care – PPO | Admitting: Lab

## 2012-02-07 ENCOUNTER — Telehealth: Payer: Self-pay | Admitting: Oncology

## 2012-02-07 ENCOUNTER — Ambulatory Visit
Admission: RE | Admit: 2012-02-07 | Discharge: 2012-02-07 | Disposition: A | Discharge status: Home / Self Care | Payer: BC Managed Care – PPO | Source: Ambulatory Visit | Attending: Radiation Oncology | Admitting: Radiation Oncology

## 2012-02-07 ENCOUNTER — Ambulatory Visit (HOSPITAL_BASED_OUTPATIENT_CLINIC_OR_DEPARTMENT_OTHER): Payer: BC Managed Care – PPO | Admitting: Nurse Practitioner

## 2012-02-07 ENCOUNTER — Other Ambulatory Visit: Payer: Self-pay | Admitting: *Deleted

## 2012-02-07 VITALS — BP 149/80 | HR 76 | Temp 98.6°F | Resp 18 | Wt 132.4 lb

## 2012-02-07 VITALS — BP 129/72 | HR 80 | Temp 97.7°F | Ht 64.0 in | Wt 132.2 lb

## 2012-02-07 DIAGNOSIS — R5383 Other fatigue: Secondary | ICD-10-CM

## 2012-02-07 DIAGNOSIS — C211 Malignant neoplasm of anal canal: Secondary | ICD-10-CM

## 2012-02-07 DIAGNOSIS — B373 Candidiasis of vulva and vagina: Secondary | ICD-10-CM

## 2012-02-07 DIAGNOSIS — R110 Nausea: Secondary | ICD-10-CM

## 2012-02-07 DIAGNOSIS — N9489 Other specified conditions associated with female genital organs and menstrual cycle: Secondary | ICD-10-CM

## 2012-02-07 LAB — CBC WITH DIFFERENTIAL/PLATELET
Basophils Absolute: 0 10*3/uL (ref 0.0–0.1)
Eosinophils Absolute: 0.1 10*3/uL (ref 0.0–0.5)
HCT: 30.9 % — ABNORMAL LOW (ref 34.8–46.6)
HGB: 10.4 g/dL — ABNORMAL LOW (ref 11.6–15.9)
LYMPH%: 29.5 % (ref 14.0–49.7)
MCV: 93.7 fL (ref 79.5–101.0)
MONO#: 0.4 10*3/uL (ref 0.1–0.9)
NEUT#: 2.8 10*3/uL (ref 1.5–6.5)
NEUT%: 58.7 % (ref 38.4–76.8)
Platelets: 256 10*3/uL (ref 145–400)
RBC: 3.3 10*6/uL — ABNORMAL LOW (ref 3.70–5.45)
WBC: 4.7 10*3/uL (ref 3.9–10.3)
nRBC: 0 % (ref 0–0)

## 2012-02-07 LAB — BASIC METABOLIC PANEL
CO2: 28 mEq/L (ref 19–32)
Chloride: 103 mEq/L (ref 96–112)
Creatinine, Ser: 0.78 mg/dL (ref 0.50–1.10)
Glucose, Bld: 101 mg/dL — ABNORMAL HIGH (ref 70–99)

## 2012-02-07 MED ORDER — FLUCONAZOLE 150 MG PO TABS
150.0000 mg | ORAL_TABLET | Freq: Once | ORAL | Status: DC
Start: 2012-02-07 — End: 2012-02-09

## 2012-02-07 NOTE — Progress Notes (Signed)
OFFICE PROGRESS NOTE  Interval history:  [name] returns as scheduled. She overall is feeling better. Yesterday her energy level was "near-normal". Today she feels "weak and nauseated". She reports a recent urinary tract infection. She completed a course of antibiotics. She recently noted itching in the vaginal region. Bowels moving regularly. She is no longer having pain with bowel movements. She denies diarrhea. Appetite varies. She continues to have difficulty sleeping.   Objective: Blood pressure 129/72, pulse 80, temperature 97.7 F (36.5 C), temperature source Oral, height 5\' 4"  (1.626 m), weight 132 lb 3.2 oz (59.966 kg).  Oropharynx is without thrush or ulceration. Lungs are clear. Regular cardiac rhythm. Abdomen is soft and nontender. No organomegaly. Extremities are without edema. Calves are soft and nontender. Very minimal erythema at the perineum/perianal region. Small area of skin breakdown at the lower gluteal cleft.  Lab Results: Lab Results  Component Value Date   WBC 4.7 02/07/2012   HGB 10.4* 02/07/2012   HCT 30.9* 02/07/2012   MCV 93.7 02/07/2012   PLT 256 02/07/2012    Chemistry:    Chemistry      Component Value Date/Time   NA 140 01/25/2012 1512   K 3.3* 01/25/2012 1512   CL 103 01/25/2012 1512   CO2 26 01/25/2012 1512   BUN 6 01/25/2012 1512   CREATININE 0.51 01/25/2012 1512      Component Value Date/Time   CALCIUM 9.5 01/25/2012 1512   ALKPHOS 84 01/25/2012 1512   AST 21 01/25/2012 1512   ALT 13 01/25/2012 1512   BILITOT 0.3 01/25/2012 1512       Studies/Results: No results found.  Medications: I have reviewed the patient's current medications.  Assessment/Plan:  1. Poorly differentiated carcinoma, basaloid squamous cell carcinoma, of the anal canal and posterior vagina. Staging CT scans of the abdomen and pelvis on 11/15/2011 with suspicious perirectal lymph node and no evidence of distant metastatic disease. She began radiation 11/29/2011. She completed cycle  1 mitomycin and 5 fluorouracil 11/29/2011. She completed cycle 2 beginning 12/27/2011. She completed the course of radiation on 01/05/2012. 2. Rectal bleeding secondary to #1, improved. 3. Erythema and desquamation related to radiation. Significantly improved. She has discontinued OxyContin and oxycodone. 4. Anemia. Improved. 5. Status post placement right upper the PICC line 11/29/2011. The PICC line has been removed. 6. "Weakness". Overall improved. 7. Depression. She recently discontinued Cymbalta and began Zoloft. 8. Recent urinary tract infection. She completed a course of antibiotics. 9. Vaginal itching in the setting of recent antibiotics. We will treat her for a vaginal yeast infection with Diflucan 150 mg x1. She has instructions to repeat the dose in one week if the symptoms persist.  Disposition-[name] appears improved. She will return for a followup visit in 2 weeks. She will contact the office in the interim with any problems.  Lonna Cobb ANP/GNP-BC

## 2012-02-07 NOTE — Telephone Encounter (Signed)
l/m with 4./22 appt info    aom

## 2012-02-07 NOTE — Progress Notes (Signed)
Patient presents to the clinic today unaccompanied for a follow up appointment with Dr. Dayton Scrape. Patient is alert and oriented to person, place, and time. No distress noted. Steady gait noted. Pleasant affect noted. Patient denies pain at this time. Patient reports that Saturday and Sunday we felt "normal." However, patient reports she work up this morning nauseated and weak. Patient denies emesis. Patient reports that she is scheduled to follow up with Dr. Myrle Sheng in two weeks. Patient reports that SOB is "better." Patient prescribed Diflucan for yeast infection related to effects of antibiotic therapy. Patient reports that Dr. Katrinka Blazing gave her seven sleeping pills to take but she only has one left. Patient reports that she is now back up to 100 mg of zoloft per day. Patient state,"my bottom looks good." Reported all findings to Dr. Dayton Scrape.

## 2012-02-07 NOTE — Progress Notes (Signed)
Followup note:  The patient returns today approximately 1 month following completion of chemoradiation in the management of her T4 N0 squamous cell carcinoma of the anal canal. She still having fatigue and nausea. She did have a few days where she felt well. She recently completed a course of antibiotics through Dr. Laurann Montana (? Sulfa) for urinary tract infection. Today, she was given a prescription for Diflucan for a suspected vaginal yeast infection by nurse practitioner, Lonna Cobb. . She is now on Zoloft for her depression. She was reassured by her important that she be we keep her job. The patient feels that she may be able to return to work by April 22.  She is not examined today.  Impression: Persistent nausea, perhaps related to her recent course of antibiotics. She is being treated for a vaginal yeast infection. We will keep her at work until she feels well. A note was written today. I'll see the patient back for a followup visit on Thursday, April 18, and hope to have her return to work by Monday, April 22. I will examine her later next week.

## 2012-02-08 ENCOUNTER — Ambulatory Visit: Payer: BC Managed Care – PPO | Admitting: Nurse Practitioner

## 2012-02-08 ENCOUNTER — Other Ambulatory Visit: Payer: BC Managed Care – PPO | Admitting: Lab

## 2012-02-09 ENCOUNTER — Emergency Department (HOSPITAL_COMMUNITY)
Admission: EM | Admit: 2012-02-09 | Discharge: 2012-02-09 | Disposition: A | Discharge status: Home Health | Payer: BC Managed Care – PPO | Attending: Emergency Medicine | Admitting: Emergency Medicine

## 2012-02-09 ENCOUNTER — Other Ambulatory Visit: Payer: Self-pay

## 2012-02-09 ENCOUNTER — Telehealth: Payer: Self-pay | Admitting: *Deleted

## 2012-02-09 ENCOUNTER — Emergency Department (HOSPITAL_COMMUNITY): Payer: BC Managed Care – PPO

## 2012-02-09 ENCOUNTER — Encounter (HOSPITAL_COMMUNITY): Payer: Self-pay | Admitting: *Deleted

## 2012-02-09 DIAGNOSIS — E785 Hyperlipidemia, unspecified: Secondary | ICD-10-CM | POA: Insufficient documentation

## 2012-02-09 DIAGNOSIS — F411 Generalized anxiety disorder: Secondary | ICD-10-CM | POA: Insufficient documentation

## 2012-02-09 DIAGNOSIS — F329 Major depressive disorder, single episode, unspecified: Secondary | ICD-10-CM | POA: Insufficient documentation

## 2012-02-09 DIAGNOSIS — F3289 Other specified depressive episodes: Secondary | ICD-10-CM | POA: Insufficient documentation

## 2012-02-09 DIAGNOSIS — Z79899 Other long term (current) drug therapy: Secondary | ICD-10-CM | POA: Insufficient documentation

## 2012-02-09 DIAGNOSIS — R0602 Shortness of breath: Secondary | ICD-10-CM

## 2012-02-09 LAB — BASIC METABOLIC PANEL
CO2: 27 mEq/L (ref 19–32)
Calcium: 9.2 mg/dL (ref 8.4–10.5)
Creatinine, Ser: 0.6 mg/dL (ref 0.50–1.10)
Glucose, Bld: 101 mg/dL — ABNORMAL HIGH (ref 70–99)

## 2012-02-09 LAB — CBC
Hemoglobin: 10.1 g/dL — ABNORMAL LOW (ref 12.0–15.0)
MCH: 30.6 pg (ref 26.0–34.0)
MCV: 93.6 fL (ref 78.0–100.0)
RBC: 3.3 MIL/uL — ABNORMAL LOW (ref 3.87–5.11)

## 2012-02-09 LAB — POCT I-STAT TROPONIN I: Troponin i, poc: 0 ng/mL (ref 0.00–0.08)

## 2012-02-09 IMAGING — CR DG CHEST 2V
2 series · 2 of 2 positions shown · non-contrast
Comparison: Chest x-ray [DATE].

CLINICAL DATA: If.  History of rectal cancer.

CHEST - 2 VIEW

[w chest pa]
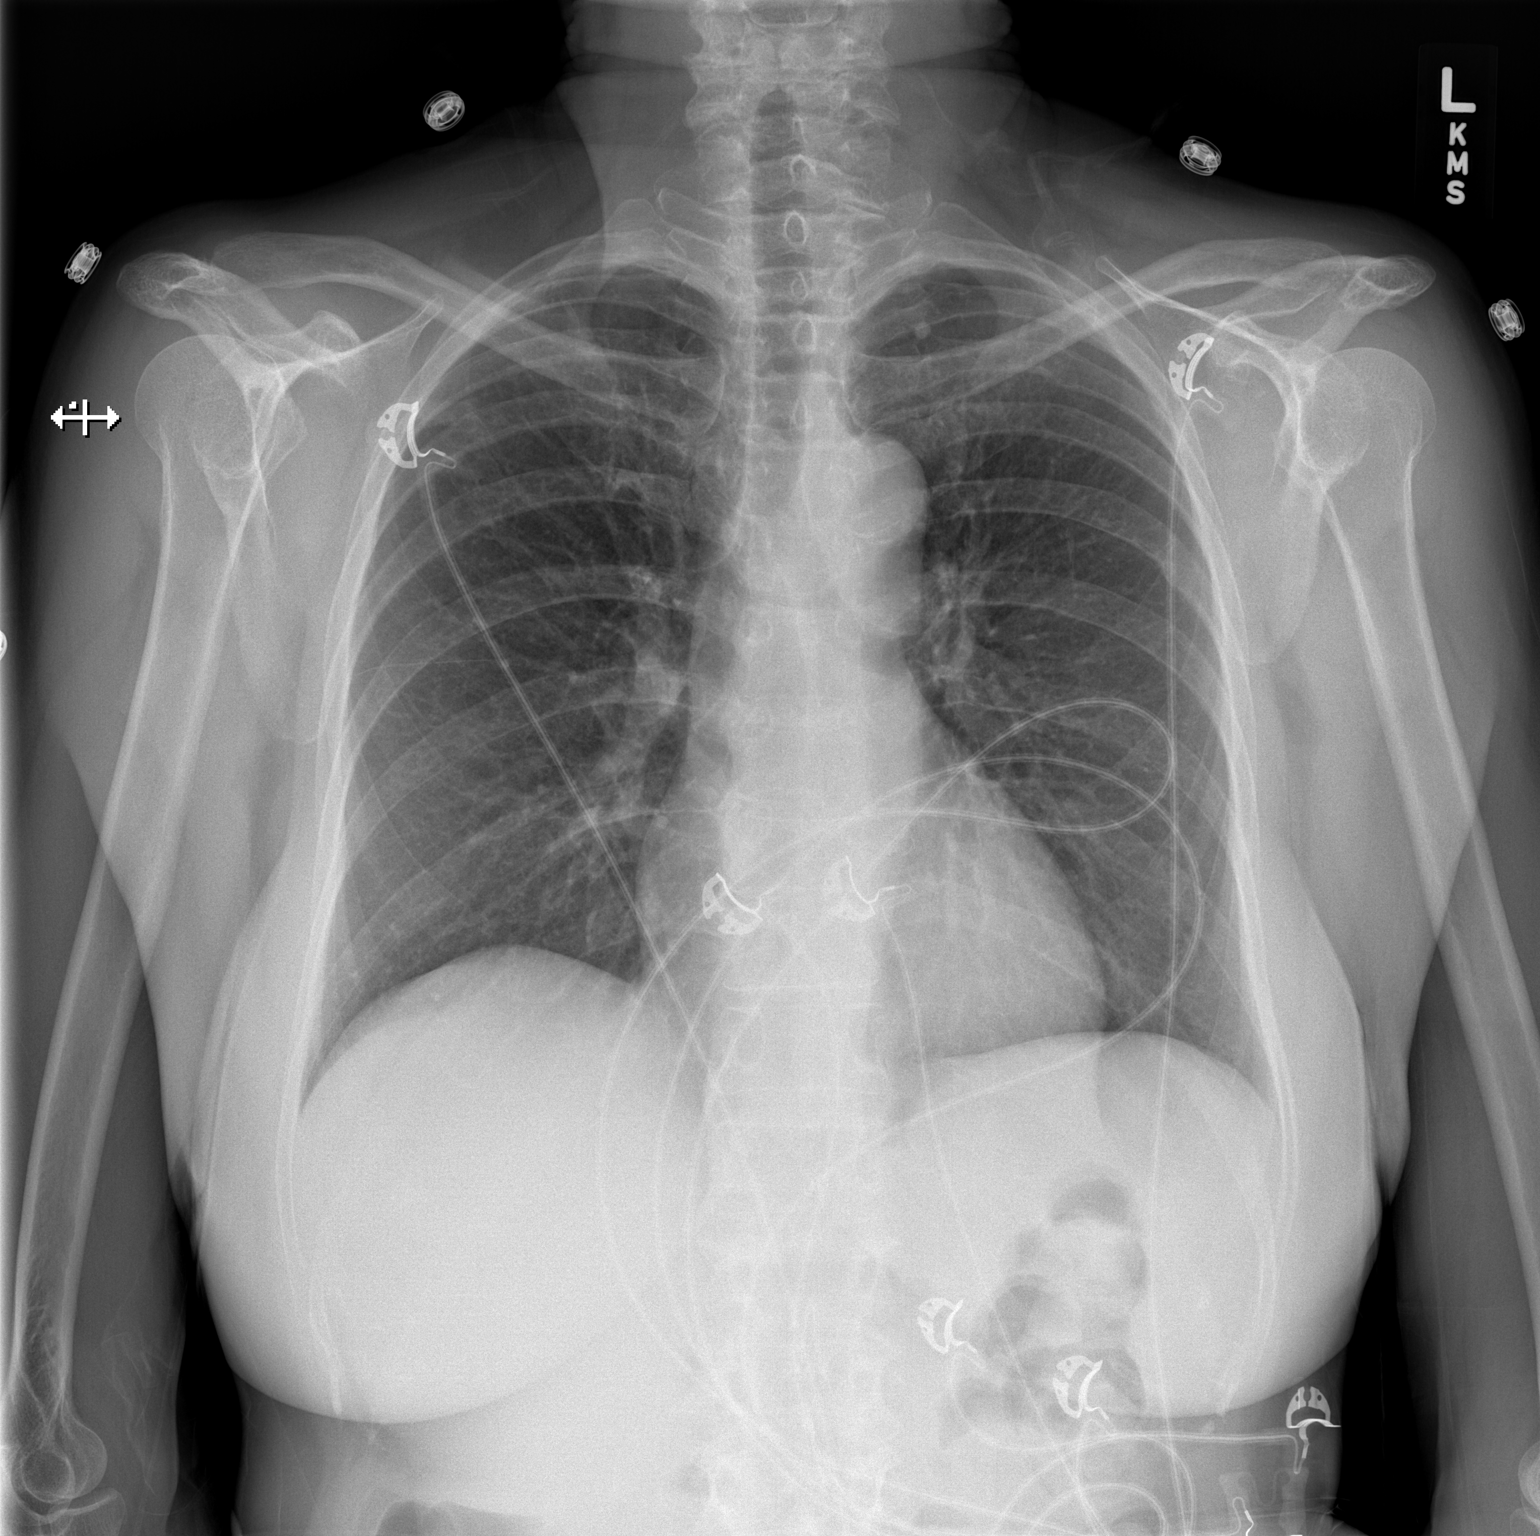

[w chest lat]
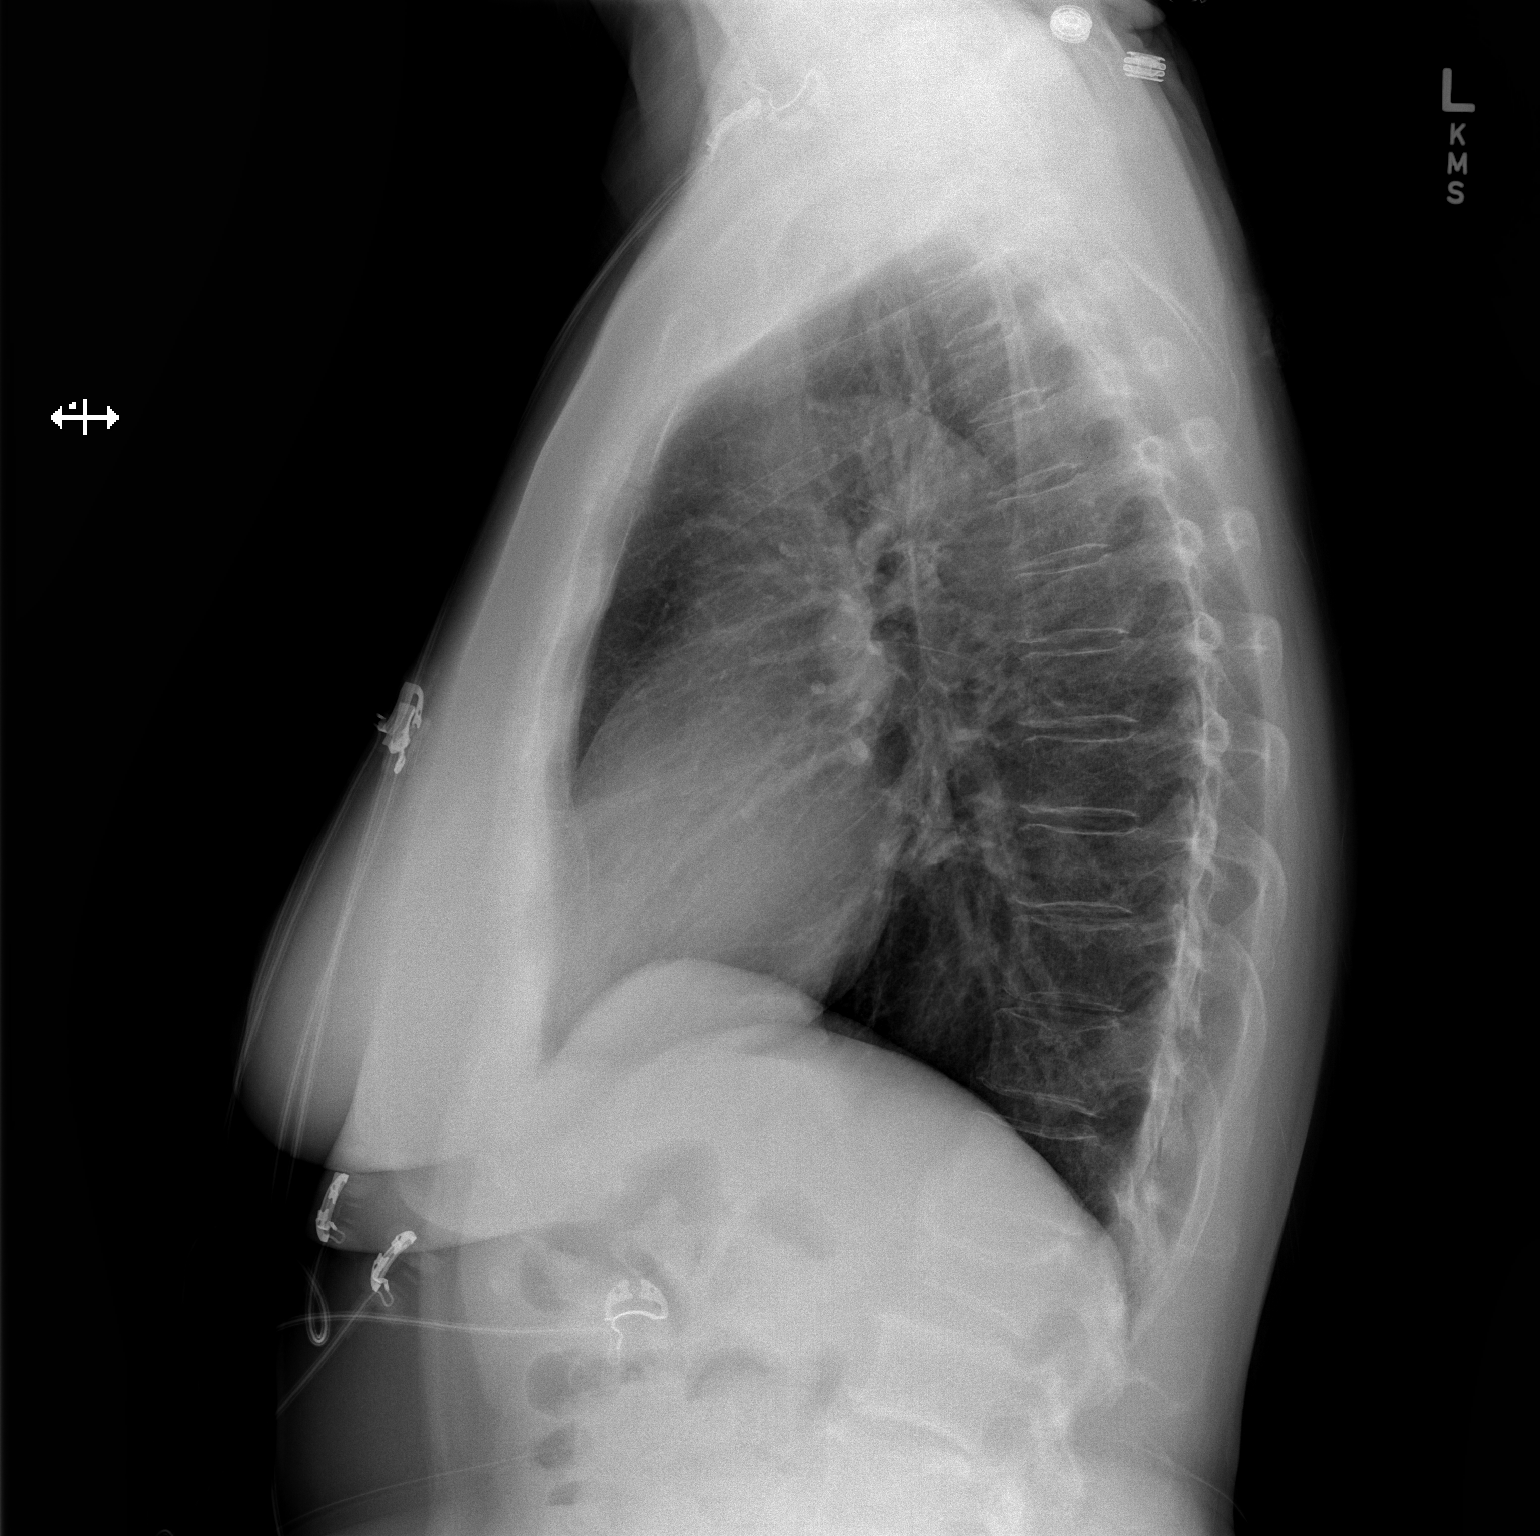

[2 of 2 positions shown; findings below may reference images not displayed]

FINDINGS: Lung volumes are normal.  No consolidative airspace
disease.  No pleural effusions.  Pulmonary vasculature and the
cardiomediastinal silhouette are within normal limits.
Atherosclerotic calcifications within the arch of the aorta.  4 mm
calcified nodule in the apex of the left upper lobe is unchanged.
Old compression fracture of L1 with approximately 50% loss of
height anteriorly, old compression fracture of superior endplate of
L2, and prominent Schmorl's node in the superior endplate of T11
all appear similar to prior study.
IMPRESSION: 1.  No radiographic evidence of acute cardiopulmonary disease.
2.  Small calcified granuloma in the left apex.
3.  Old compression fractures of L1 and superior endplate of L2,
and prominent Schmorl's node in the superior endplate of T11 while
appears similar compared to prior study [DATE].

## 2012-02-09 IMAGING — CT CT ANGIO CHEST
1 of 2 series · 19 of 32 positions shown · IV contrast (APPLIED)
Comparison: PET CT dated [DATE].

CLINICAL DATA: Shortness of breath for past 2 weeks.  History of
anal cancer status post chemotherapy and radiation therapy.

CT ANGIOGRAPHY CHEST
TECHNIQUE: Multidetector CT imaging of the chest using the
standard protocol during bolus administration of intravenous
contrast. Multiplanar reconstructed images including MIPs were
obtained and reviewed to evaluate the vascular anatomy.
Contrast: 100mL OMNIPAQUE IOHEXOL 300 MG/ML  SOLN

[Series 11: thins for pacs · axial · 0.72mm/px · z∈[-50,+152]mm · 19 of 226 slices shown]
[im 12/226  lung]
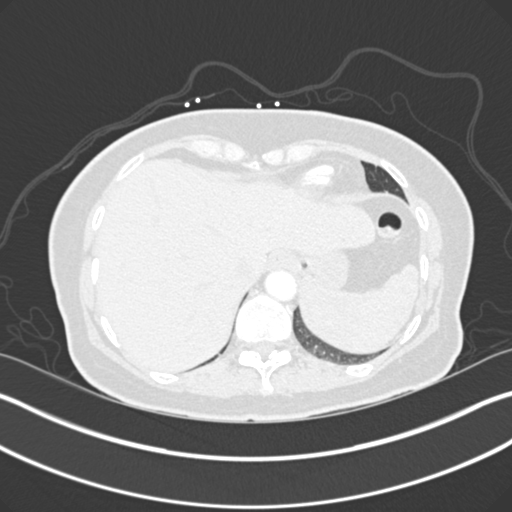
[im 23/226  mediastinal]
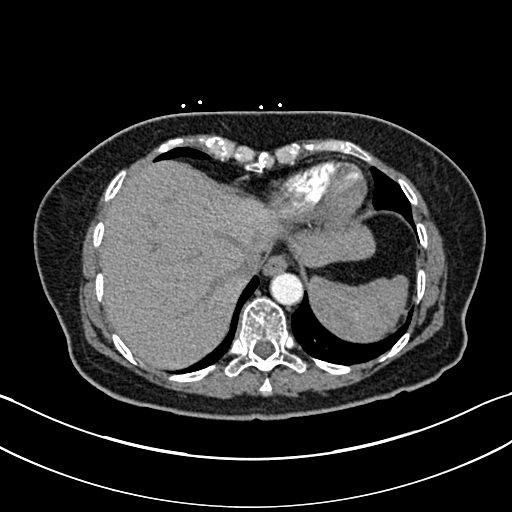
[im 34/226  lung]
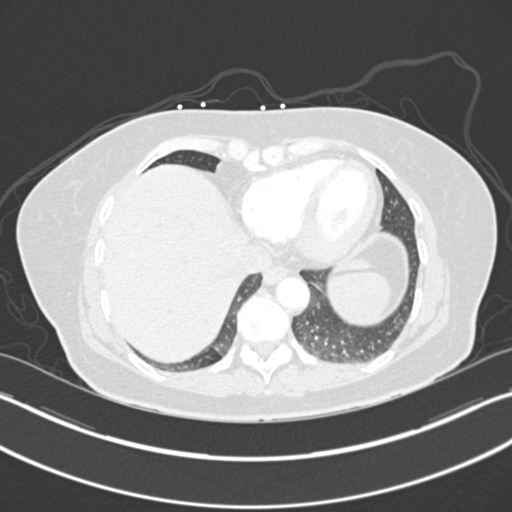
[im 57/226  mediastinal]
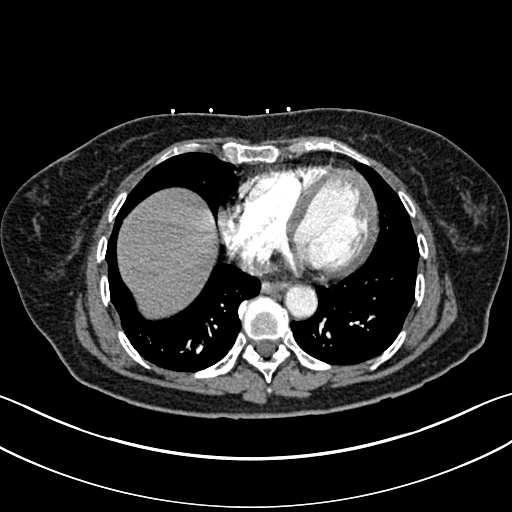
[im 68/226  lung]
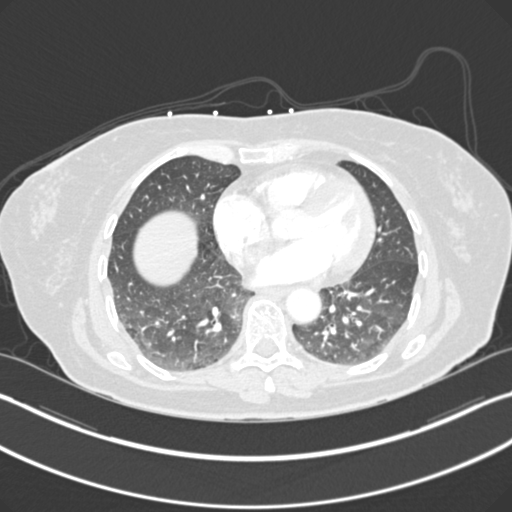
[im 76/226  mediastinal]
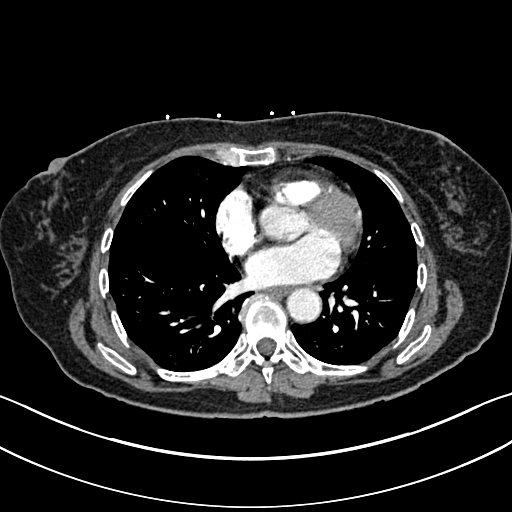
[im 79/226  lung]
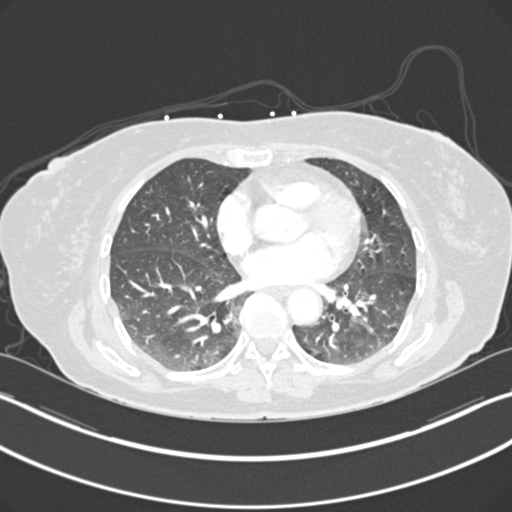
[im 91/226  mediastinal]
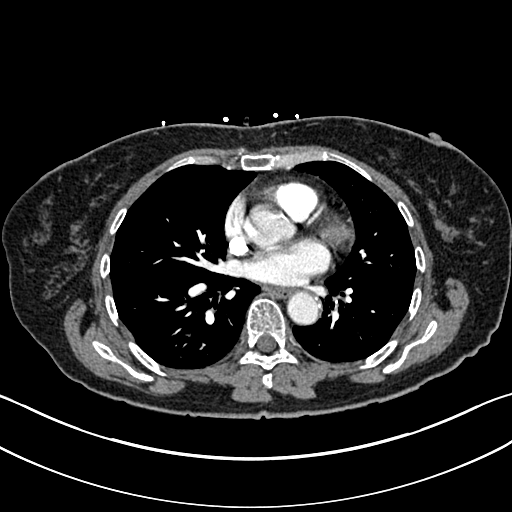
[im 102/226  lung]
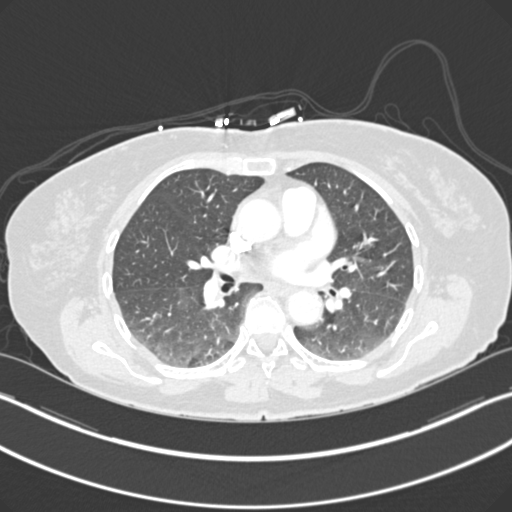
[im 113/226  mediastinal]
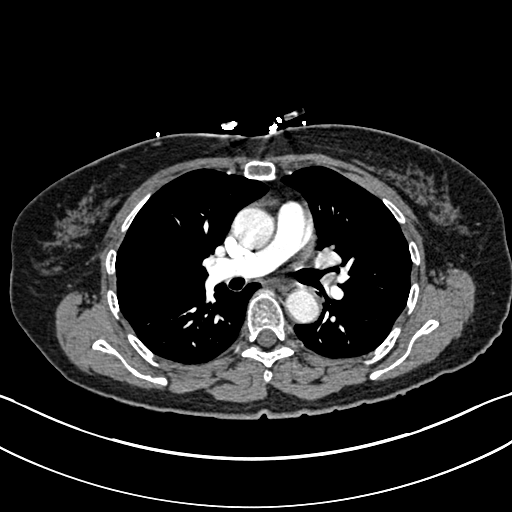
[im 124/226  lung]
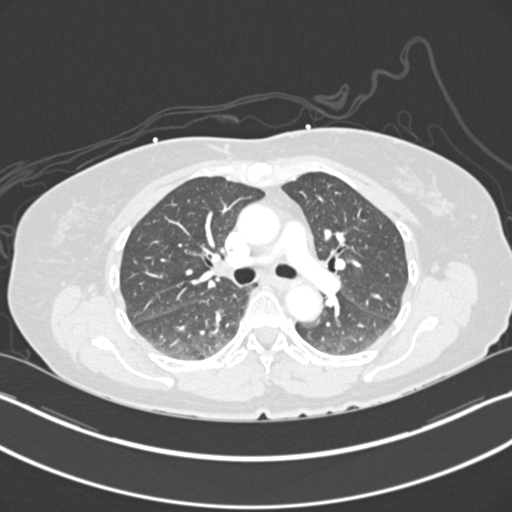
[im 136/226  mediastinal]
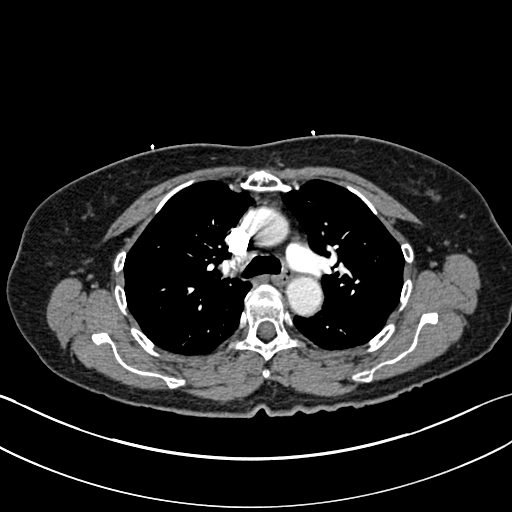
[im 147/226  lung]
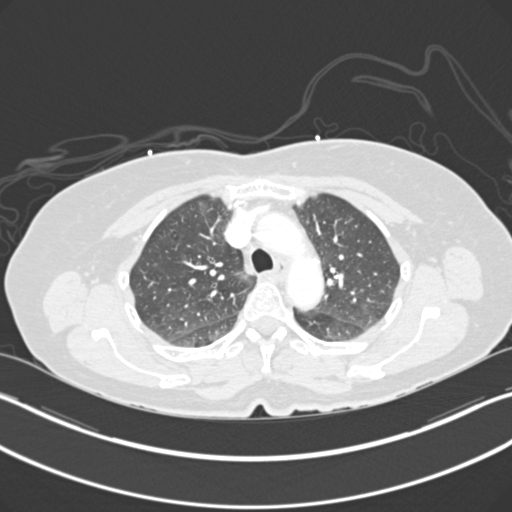
[im 151/226  mediastinal]
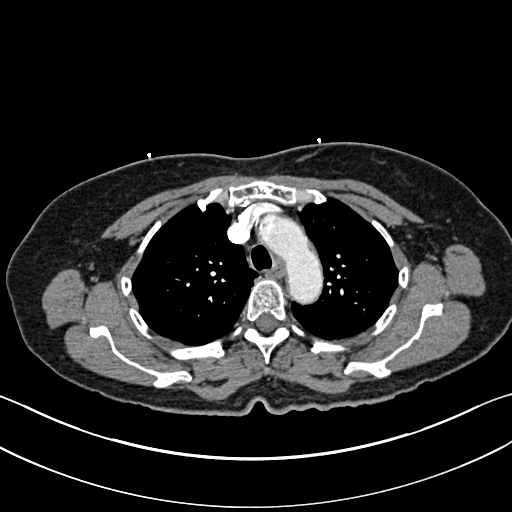
[im 158/226  lung]
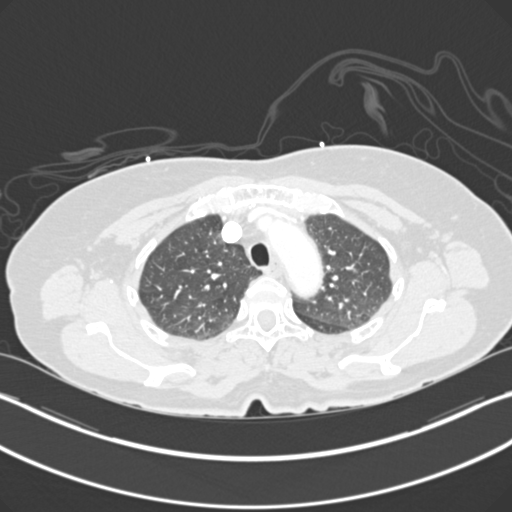
[im 169/226  mediastinal]
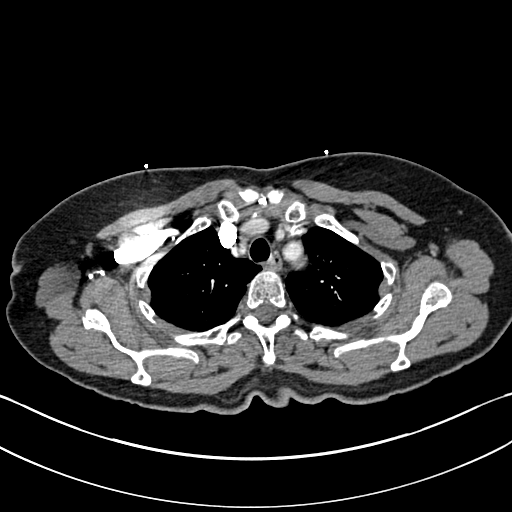
[im 192/226  lung]
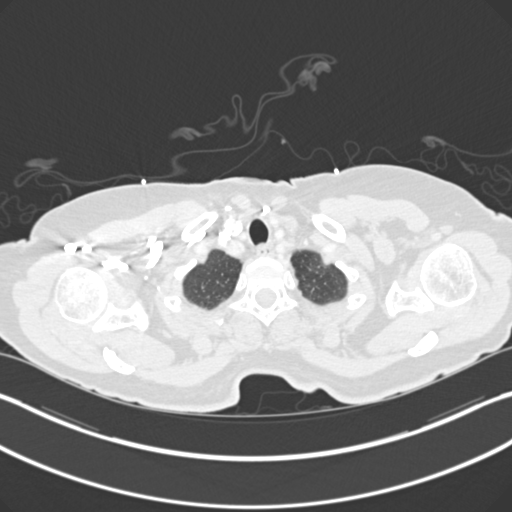
[im 203/226  mediastinal]
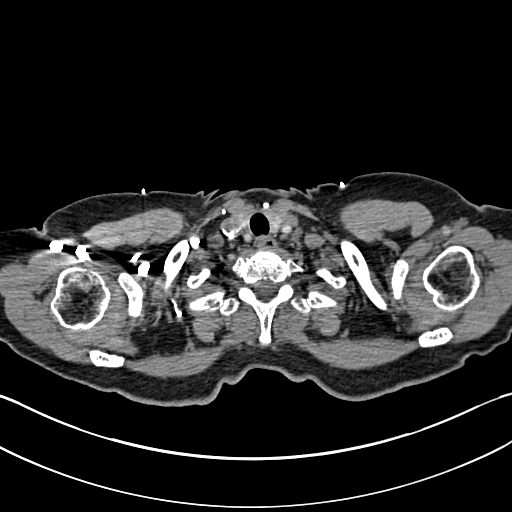
[im 214/226  lung]
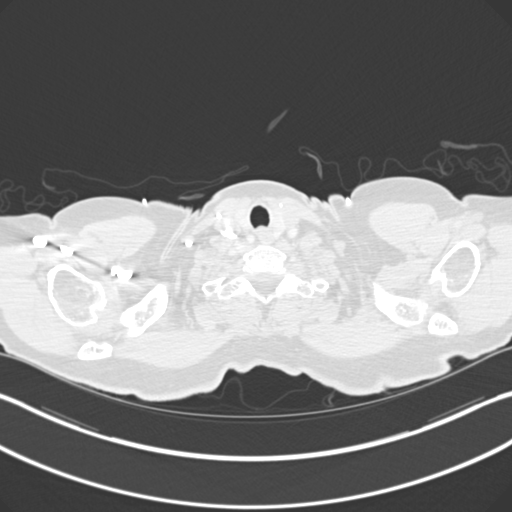

[19 of 32 positions shown; findings below may reference images not displayed]

FINDINGS: Mediastinum: There are no filling defects within the pulmonary
arterial tree to suggest underlying pulmonary embolism. Heart size
is normal. There is no significant pericardial fluid, thickening or
pericardial calcification. No pathologically enlarged mediastinal
or hilar lymph nodes. Mild atherosclerosis of the thoracic aorta.
Esophagus is unremarkable in appearance.

Lungs/Pleura:  There are no suspicious appearing pulmonary nodules
or masses identified.  No consolidative airspace disease.  No
pleural effusions.  There are a few small calcified granulomas, the
largest of which is at the left apex.

Upper Abdomen: Visualized portions of the upper abdomen are
unremarkable.

Musculoskeletal: There are no aggressive appearing lytic or blastic
lesions noted in the visualized portions of the skeleton.  Multiple
old healed posterior left sided rib fractures are incidentally
noted.
IMPRESSION: 1.  No evidence of pulmonary embolism.
2.  No acute findings in the thorax to account for the patient's
symptoms.
3.  Mild atherosclerosis.
4.  Multiple old healed left-sided rib fractures incidentally
noted.

## 2012-02-09 MED ORDER — IOHEXOL 300 MG/ML  SOLN
100.0000 mL | Freq: Once | INTRAMUSCULAR | Status: AC | PRN
  Administered 2012-02-09: 100 mL via INTRAVENOUS

## 2012-02-09 NOTE — ED Provider Notes (Signed)
History     CSN: [id number]  Arrival date & time 02/09/12  1059   First MD Initiated Contact with Patient 02/09/12 1149      Chief Complaint  Patient presents with  . Shortness of Breath    (Consider location/radiation/quality/duration/timing/severity/associated sxs/prior treatment) HPI Pt presents with c/o shortness of breath over the past one week.  Pt is s/p chemo and radiation treatment for rectal/anal cancer.  She states she is concerned that her chemo med is causing the side effect of her shortness of breath.  She denies chest pain, no cough, no fever/chills, no leg swelling.  No hx of DVT/PE.  While evaluating her she starts to actively gasp for air when asked about her sob symptoms, however when not talking about the sob she is calm and breathing comfortably.  She does have a hx of anxiety  Past Medical History  Diagnosis Date  . Depression   . Hyperlipidemia   . Vitamin d deficiency   . Anxiety   . Rectal bleeding   . anal ca dx'd 11/2011    Past Surgical History  Procedure Date  . Hemorrhoid surgery 1980    Patient had to guess on the date.  . Abdominal hysterectomy 1978    age 25  . Tubal ligation     b/l  age 82    Family History  Problem Relation Age of Onset  . Cancer Sister     breast/ age 38  . Alcohol abuse Maternal Uncle   . Cervical cancer Sister     47    History  Substance Use Topics  . Smoking status: Former Smoker -- 0.5 packs/day for 25 years    Types: Cigarettes    Quit date: 09/07/1987  . Smokeless tobacco: Never Used  . Alcohol Use: No    OB History    Grav Para Term Preterm Abortions TAB SAB Ect Mult Living   1 1              Review of Systems ROS reviewed and all otherwise negative except for mentioned in HPI  Allergies  Review of patient's allergies indicates no known allergies.  Home Medications   Current Outpatient Rx  Name Route Sig Dispense Refill  . BUSPIRONE HCL 10 MG PO TABS Oral Take 10 mg by mouth daily.       [name] CALCIUM CARBONATE ANTACID 500 MG PO CHEW Oral Chew 1 tablet by mouth 2 (two) times daily.    [name] VITAMIN D 1000 UNITS PO TABS Oral Take 1,000 Units by mouth daily.    [name] FERROUS SULFATE 325 (65 FE) MG PO TABS Oral Take 325 mg by mouth 2 (two) times daily.    [name] FOLIC ACID 400 MCG PO TABS Oral Take 400 mcg by mouth daily.    [name] NIACIN 250 MG PO TABS Oral Take 250 mg by mouth daily with breakfast.    . PRAVASTATIN SODIUM 40 MG PO TABS Oral Take 40 mg by mouth daily with breakfast.     . PROCHLORPERAZINE MALEATE 10 MG PO TABS Oral Take 1 tablet (10 mg total) by mouth every 6 (six) hours as needed. 60 tablet 1  . SERTRALINE HCL 100 MG PO TABS Oral Take 100 mg by mouth daily.      BP 142/66  Pulse 86  Temp(Src) 98.7 F (37.1 C) (Oral)  Resp 24  Ht 5\' 4"  (1.626 m)  Wt 133 lb 6 oz (60.499 kg)  BMI 22.89 kg/m2  SpO2  100% Vitals reviewed Physical Exam Physical Examination: General appearance - alert, well appearing, and in no distress Mental status - alert, oriented to person, place, and time Eyes - pupils equal and reactive, no conjunctival injection Mouth - mucous membranes moist, pharynx normal without lesions Chest - clear to auscultation, no wheezes, rales or rhonchi, symmetric air entry, no increased respiratory effort Heart - normal rate, regular rhythm, normal S1, S2, no murmurs, rubs, clicks or gallops Abdomen - soft, nontender, nondistended, no masses or organomegaly Extremities - peripheral pulses normal, no pedal edema, no clubbing or cyanosis Skin - normal coloration and turgor, no rashes Psych- normal mood, but does seem anxious when talking about her sob  ED Course  Procedures (including critical care time)   Date: 02/09/2012  Rate: 79  Rhythm: normal sinus rhythm  QRS Axis: normal  Intervals: normal  ST/T Wave abnormalities: normal  Conduction Disutrbances: none  Narrative Interpretation: unremarkable       Labs Reviewed  CBC - Abnormal; Notable for the  following:    RBC 3.30 (*)    Hemoglobin 10.1 (*)    HCT 30.9 (*)    RDW 21.7 (*)    All other components within normal limits  BASIC METABOLIC PANEL - Abnormal; Notable for the following:    Glucose, Bld 101 (*)    All other components within normal limits  PRO B NATRIURETIC PEPTIDE  POCT I-STAT TROPONIN I  LAB REPORT - SCANNED   Dg Chest 2 View  02/09/2012  *RADIOLOGY REPORT*  Clinical Data: If.  History of rectal cancer.  CHEST - 2 VIEW  Comparison: Chest x-ray 11/16/2011.  Findings: Lung volumes are normal.  No consolidative airspace disease.  No pleural effusions.  Pulmonary vasculature and the cardiomediastinal silhouette are within normal limits. Atherosclerotic calcifications within the arch of the aorta.  4 mm calcified nodule in the apex of the left upper lobe is unchanged. Old compression fracture of L1 with approximately 50% loss of height anteriorly, old compression fracture of superior endplate of L2, and prominent Schmorl's node in the superior endplate of T11 all appear similar to prior study.  IMPRESSION: 1.  No radiographic evidence of acute cardiopulmonary disease. 2.  Small calcified granuloma in the left apex. 3.  Old compression fractures of L1 and superior endplate of L2, and prominent Schmorl's node in the superior endplate of T11 while appears similar compared to prior study 11/16/2011.  Original Report Authenticated By: Florencia Reasons, M.D.   Ct Angio Chest W/cm &/or Wo Cm  02/09/2012  *RADIOLOGY REPORT*  Clinical Data: Shortness of breath for past 2 weeks.  History of anal cancer status post chemotherapy and radiation therapy.  CT ANGIOGRAPHY CHEST  Technique:  Multidetector CT imaging of the chest using the standard protocol during bolus administration of intravenous contrast. Multiplanar reconstructed images including MIPs were obtained and reviewed to evaluate the vascular anatomy.  Contrast: OMNIPAQUE IOHEXOL 300 MG/ML  SOLN  Comparison: PET CT dated  11/17/2011.  Findings:  Mediastinum: There are no filling defects within the pulmonary arterial tree to suggest underlying pulmonary embolism. Heart size is normal. There is no significant pericardial fluid, thickening or pericardial calcification. No pathologically enlarged mediastinal or hilar lymph nodes. Mild atherosclerosis of the thoracic aorta. Esophagus is unremarkable in appearance.  Lungs/Pleura:  There are no suspicious appearing pulmonary nodules or masses identified.  No consolidative airspace disease.  No pleural effusions.  There are a few small calcified granulomas, the largest of which is at  the left apex.  Upper Abdomen: Visualized portions of the upper abdomen are unremarkable.  Musculoskeletal: There are no aggressive appearing lytic or blastic lesions noted in the visualized portions of the skeleton.  Multiple old healed posterior left sided rib fractures are incidentally noted.  IMPRESSION: 1.  No evidence of pulmonary embolism. 2.  No acute findings in the thorax to account for the patient's symptoms. 3.  Mild atherosclerosis. 4.  Multiple old healed left-sided rib fractures incidentally noted.  Original Report Authenticated By: Florencia Reasons, M.D.     1. Shortness of breath       MDM  Pt with hx of rectal cancer s/p chemotherapy and radiation presenting with c/o shortness of breath.  Not sob in ED, no chest pain.  Workup in ED reassuring including CT angio chest without signs of PE.  Pt is in no distress.  Discharged with strict return precautions.  Pt agreeable with plan.        Ethelda Chick, MD 02/11/12 [phone]

## 2012-02-09 NOTE — ED Notes (Signed)
Pt states "I finished chemo 2 wks ago, my breathing has been worse for a wk but today I feel like it's gotten worse, [facility] & they told me to come to the ED and have it checked, I looked up the medicine and it said it could cause shortness of breath"; pt NAD @ this time.

## 2012-02-09 NOTE — Discharge Instructions (Signed)
Return to the ED with any concerns including fever, chest pain, difficulty breathing, leg swelling, decreased level of alertness/lethargy, or any other alarming symptoms

## 2012-02-09 NOTE — Telephone Encounter (Signed)
Call from pt reporting increased shortness of breath. Pt audibly gasping for breath while talking. Suggested she go to ED to be evaluated. Dr, Truett Perna made aware.

## 2012-02-15 ENCOUNTER — Ambulatory Visit (HOSPITAL_BASED_OUTPATIENT_CLINIC_OR_DEPARTMENT_OTHER): Payer: BC Managed Care – PPO | Admitting: Nurse Practitioner

## 2012-02-15 ENCOUNTER — Encounter: Payer: Self-pay | Admitting: Radiation Oncology

## 2012-02-15 ENCOUNTER — Other Ambulatory Visit (HOSPITAL_BASED_OUTPATIENT_CLINIC_OR_DEPARTMENT_OTHER): Payer: BC Managed Care – PPO | Admitting: Lab

## 2012-02-15 ENCOUNTER — Ambulatory Visit
Admission: RE | Admit: 2012-02-15 | Discharge: 2012-02-15 | Disposition: A | Discharge status: Home / Self Care | Payer: BC Managed Care – PPO | Source: Ambulatory Visit | Attending: Radiation Oncology | Admitting: Radiation Oncology

## 2012-02-15 ENCOUNTER — Telehealth: Payer: Self-pay | Admitting: Oncology

## 2012-02-15 VITALS — BP 129/72 | HR 81 | Temp 98.3°F | Resp 18 | Wt 134.6 lb

## 2012-02-15 VITALS — BP 126/58 | HR 86 | Temp 97.6°F | Ht 64.0 in | Wt 134.2 lb

## 2012-02-15 DIAGNOSIS — C211 Malignant neoplasm of anal canal: Secondary | ICD-10-CM

## 2012-02-15 LAB — CBC WITH DIFFERENTIAL/PLATELET
Basophils Absolute: 0 10*3/uL (ref 0.0–0.1)
Eosinophils Absolute: 0.1 10*3/uL (ref 0.0–0.5)
HGB: 10.5 g/dL — ABNORMAL LOW (ref 11.6–15.9)
MCV: 93.5 fL (ref 79.5–101.0)
MONO#: 0.3 10*3/uL (ref 0.1–0.9)
MONO%: 9.7 % (ref 0.0–14.0)
NEUT#: 2 10*3/uL (ref 1.5–6.5)
Platelets: 220 10*3/uL (ref 145–400)
RDW: 20.7 % — ABNORMAL HIGH (ref 11.2–14.5)

## 2012-02-15 NOTE — Progress Notes (Signed)
Followup note:  The patient returns today almost 6 weeks out from chemoradiation in the management of her T4 N0 squamous cell carcinoma of the anal canal. She feels much improved and feels that she can return to work. No GU or GI difficulties. She was evaluated in the emergency department on April 10 for shortness of breath and there is no evidence for pulmonary emboli. She is without dyspnea today. She saw nurse practitioner Lonna Cobb earlier today.  Physical examination: Nodes: Without palpable cervical, supraclavicular, or inguinal lymphadenopathy. Rectal: She's had complete reepithelialization of her perineum. I attempted a digital exam of her anal canal, but she was still too uncomfortable. She appears to have circumferential narrowing of her anus but no palpable tumor along the lower canal.  Impression: Satisfactory progress.  Plan: I'll have her return for a followup visit in one month. If she is still too uncomfortable for a digital examination will have her see Dr. Derrell Lolling for a possible dilatation and anoscopy 2-3 months from now. She can return to work and she was released to begin April 22.

## 2012-02-15 NOTE — Progress Notes (Signed)
HERE TODAY FOR FU OF ANAL CA.  STATES THAT SHE IS MUCH BETTER.  NO PROBLEM WITH BM.  JUST LEFT DR. SHERRILL AND HE EXAMINED HER BOTTOM AND SAID EVERYTHING WAS LOOKING GOOD AND HEALING

## 2012-02-15 NOTE — Telephone Encounter (Signed)
appts made and printed for pt aom °

## 2012-02-15 NOTE — Progress Notes (Signed)
OFFICE PROGRESS NOTE  Interval history:  [name] returns as scheduled. She was evaluated in the emergency Department on 02/09/2012 for shortness of breath. Chest CT was negative for evidence of a pulmonary embolism or other acute findings.  [name] reports she is feeling "100% better". She has had no further shortness of breath. She denies chest pain. No leg swelling or calf pain. She denies diarrhea. She is taking a stool softener. She has recently noted "light red blood" on the toilet tissue after a bowel movement. She thinks the blood is likely due to hemorrhoids. The burning and itching at the vagina improved following a dose of Diflucan. She took a second dose earlier this week as the symptoms had not completely resolved.  She recently switched back to Cymbalta from Zoloft.   Objective: Blood pressure 126/58, pulse 86, temperature 97.6 F (36.4 C), temperature source Oral, height 5\' 4"  (1.626 m), weight 134 lb 3.2 oz (60.873 kg).  Oropharynx is without thrush or ulceration. No palpable cervical or supraclavicular lymph nodes. Lungs clear. No wheezes or rales. Regular cardiac rhythm. Abdomen soft and nontender. No organomegaly. Extremities without edema. Calves are soft and nontender. Mild erythema at the perineum/perianal region. Persistent linear area of skin breakdown at the lower gluteal cleft. Small area of ulceration at the right perianal region. External hemorrhoids.  Lab Results: Lab Results  Component Value Date   WBC 3.5* 02/15/2012   HGB 10.5* 02/15/2012   HCT 31.5* 02/15/2012   MCV 93.5 02/15/2012   PLT 220 02/15/2012    Chemistry:    Chemistry      Component Value Date/Time   NA 138 02/09/2012 1222   K 4.0 02/09/2012 1222   CL 104 02/09/2012 1222   CO2 27 02/09/2012 1222   BUN 15 02/09/2012 1222   CREATININE 0.60 02/09/2012 1222      Component Value Date/Time   CALCIUM 9.2 02/09/2012 1222   ALKPHOS 84 01/25/2012 1512   AST 21 01/25/2012 1512   ALT 13 01/25/2012 1512     BILITOT 0.3 01/25/2012 1512       Studies/Results: Dg Chest 2 View  02/09/2012  *RADIOLOGY REPORT*  Clinical Data: If.  History of rectal cancer.  CHEST - 2 VIEW  Comparison: Chest x-ray 11/16/2011.  Findings: Lung volumes are normal.  No consolidative airspace disease.  No pleural effusions.  Pulmonary vasculature and the cardiomediastinal silhouette are within normal limits. Atherosclerotic calcifications within the arch of the aorta.  4 mm calcified nodule in the apex of the left upper lobe is unchanged. Old compression fracture of L1 with approximately 50% loss of height anteriorly, old compression fracture of superior endplate of L2, and prominent Schmorl's node in the superior endplate of T11 all appear similar to prior study.  IMPRESSION: 1.  No radiographic evidence of acute cardiopulmonary disease. 2.  Small calcified granuloma in the left apex. 3.  Old compression fractures of L1 and superior endplate of L2, and prominent Schmorl's node in the superior endplate of T11 while appears similar compared to prior study 11/16/2011.  Original Report Authenticated By: Florencia Reasons, M.D.   Ct Angio Chest W/cm &/or Wo Cm  02/09/2012  *RADIOLOGY REPORT*  Clinical Data: Shortness of breath for past 2 weeks.  History of anal cancer status post chemotherapy and radiation therapy.  CT ANGIOGRAPHY CHEST  Technique:  Multidetector CT imaging of the chest using the standard protocol during bolus administration of intravenous contrast. Multiplanar reconstructed images including MIPs were obtained and  reviewed to evaluate the vascular anatomy.  Contrast: OMNIPAQUE IOHEXOL 300 MG/ML  SOLN  Comparison: PET CT dated 11/17/2011.  Findings:  Mediastinum: There are no filling defects within the pulmonary arterial tree to suggest underlying pulmonary embolism. Heart size is normal. There is no significant pericardial fluid, thickening or pericardial calcification. No pathologically enlarged mediastinal or  hilar lymph nodes. Mild atherosclerosis of the thoracic aorta. Esophagus is unremarkable in appearance.  Lungs/Pleura:  There are no suspicious appearing pulmonary nodules or masses identified.  No consolidative airspace disease.  No pleural effusions.  There are a few small calcified granulomas, the largest of which is at the left apex.  Upper Abdomen: Visualized portions of the upper abdomen are unremarkable.  Musculoskeletal: There are no aggressive appearing lytic or blastic lesions noted in the visualized portions of the skeleton.  Multiple old healed posterior left sided rib fractures are incidentally noted.  IMPRESSION: 1.  No evidence of pulmonary embolism. 2.  No acute findings in the thorax to account for the patient's symptoms. 3.  Mild atherosclerosis. 4.  Multiple old healed left-sided rib fractures incidentally noted.  Original Report Authenticated By: Florencia Reasons, M.D.    Medications: I have reviewed the patient's current medications.  Assessment/Plan:  1. Poorly differentiated carcinoma, basaloid squamous cell carcinoma, of the anal canal and posterior vagina. Staging CT scans of the abdomen and pelvis on 11/15/2011 with suspicious perirectal lymph node and no evidence of distant metastatic disease. She began radiation 11/29/2011. She completed cycle 1 mitomycin and 5 fluorouracil 11/29/2011. She completed cycle 2 beginning 12/27/2011. She completed the course of radiation on 01/05/2012. 2. Rectal bleeding secondary to #1, improved. 3. Erythema and desquamation related to radiation. Significantly improved. She has discontinued OxyContin and oxycodone. 4. Anemia. Improved. 5. Status post placement right upper the PICC line 11/29/2011. The PICC line has been removed. 6. "Weakness". Overall improved. 7. Depression. She recently discontinued Cymbalta and began Zoloft. She has changed back to Cymbalta. 8. Recent urinary tract infection. She completed a course of  antibiotics. 9. Vaginal itching in the setting of recent antibiotics. She was treated for a vaginal yeast infection with Diflucan 150 mg x1. Symptoms improved but did not completely resolve. She repeated the Diflucan at a one-week interval as instructed. 10. Status post evaluation in the emergency Department on 02/09/2012 for shortness of breath. Chest CT was negative for evidence of a pulmonary embolism or other acute finding. She has had no further shortness of breath.  Disposition-[name] appears improved. She will return for a followup visit in 8 weeks. She will contact the office in the interim with any problems.  Plan reviewed with Dr. Truett Perna.  Lonna Cobb ANP/GNP-BC

## 2012-02-21 ENCOUNTER — Other Ambulatory Visit: Payer: BC Managed Care – PPO | Admitting: Lab

## 2012-02-21 ENCOUNTER — Ambulatory Visit: Payer: BC Managed Care – PPO | Admitting: Nurse Practitioner

## 2012-03-06 ENCOUNTER — Telehealth: Payer: Self-pay | Admitting: Oncology

## 2012-03-06 NOTE — Telephone Encounter (Signed)
pt  called and r/s appt on 06/11 to 06/25

## 2012-03-09 ENCOUNTER — Encounter: Payer: Self-pay | Admitting: Radiation Oncology

## 2012-03-13 ENCOUNTER — Encounter: Payer: Self-pay | Admitting: Radiation Oncology

## 2012-03-14 ENCOUNTER — Ambulatory Visit
Admission: RE | Admit: 2012-03-14 | Discharge: 2012-03-14 | Disposition: A | Discharge status: Home / Self Care | Payer: BC Managed Care – PPO | Source: Ambulatory Visit | Attending: Radiation Oncology | Admitting: Radiation Oncology

## 2012-03-14 ENCOUNTER — Encounter: Payer: Self-pay | Admitting: Radiation Oncology

## 2012-03-14 VITALS — BP 141/73 | HR 80 | Temp 97.5°F | Resp 20 | Wt 140.1 lb

## 2012-03-14 DIAGNOSIS — C211 Malignant neoplasm of anal canal: Secondary | ICD-10-CM

## 2012-03-14 NOTE — Progress Notes (Signed)
Pt reports she is working full time again, is fatigued after work. Appetite good, states BMs normal but has more than one daily,  denies issues w/bladder/urination.

## 2012-03-14 NOTE — Progress Notes (Signed)
Followup note:  The patient returns today approximately 2 months in one week following completion of chemoradiation in the management of her T4 N0 squamous cell carcinoma of the anal canal. She continues to work full-time and she is pain-free. She does admit to a narrow stool caliber. She is not on any pain medication. She tells me she will see.to her Truett Perna for a followup visit in June and then see Dr. Derrell Lolling for a possible anal dilatation and anoscopy.  Physical examination: Alert and oriented. She is in good spirits . Wt Readings from Last 3 Encounters:  03/14/12 140 lb 1.6 oz (63.549 kg)  02/15/12 134 lb 9.6 oz (61.054 kg)  02/15/12 134 lb 3.2 oz (60.873 kg)   Temp Readings from Last 3 Encounters:  03/14/12 97.5 F (36.4 C) Oral  02/15/12 98.3 F (36.8 C) Oral  02/15/12 97.6 F (36.4 C) Oral   BP Readings from Last 3 Encounters:  03/14/12 141/73  02/15/12 129/72  02/15/12 126/58   Pulse Readings from Last 3 Encounters:  03/14/12 80  02/15/12 81  02/15/12 86    Head and neck examination: Grossly unremarkable. Nodes: Without palpable cervical, supraclavicular, or inguinal lymphadenopathy. Abdomen: Soft without masses organomegaly. Rectal: There is anal canal stenosis which barely admits my fifth finger. There is slight irregularity of the anorectum mucosa anteriorly, but I do not appreciate a discrete mass along the anterior anal canal. My examination is suboptimal secondary to pain. Extremities without edema.  Impression: She may have a complete response. She clearly has anal canal stenosis which is not unexpected.  Plan: I do not feel strongly about repeating her PET scan as long as Dr. Derrell Lolling is able to perform anoscopy with perhaps a small biopsy to confirm a complete response when he sees her later in June or July. She may very well need anal canal dilatation under anesthesia. Followup visit with me in 2 months.

## 2012-04-11 ENCOUNTER — Other Ambulatory Visit: Payer: BC Managed Care – PPO | Admitting: Lab

## 2012-04-11 ENCOUNTER — Ambulatory Visit: Payer: BC Managed Care – PPO | Admitting: Oncology

## 2012-04-25 ENCOUNTER — Telehealth: Payer: Self-pay | Admitting: Oncology

## 2012-04-25 ENCOUNTER — Ambulatory Visit (HOSPITAL_BASED_OUTPATIENT_CLINIC_OR_DEPARTMENT_OTHER): Payer: BC Managed Care – PPO | Admitting: Oncology

## 2012-04-25 ENCOUNTER — Other Ambulatory Visit (HOSPITAL_BASED_OUTPATIENT_CLINIC_OR_DEPARTMENT_OTHER): Payer: BC Managed Care – PPO | Admitting: Lab

## 2012-04-25 VITALS — BP 131/71 | HR 80 | Temp 97.7°F | Ht 64.0 in | Wt 139.5 lb

## 2012-04-25 DIAGNOSIS — C211 Malignant neoplasm of anal canal: Secondary | ICD-10-CM

## 2012-04-25 DIAGNOSIS — K624 Stenosis of anus and rectum: Secondary | ICD-10-CM

## 2012-04-25 DIAGNOSIS — F329 Major depressive disorder, single episode, unspecified: Secondary | ICD-10-CM

## 2012-04-25 LAB — CBC WITH DIFFERENTIAL/PLATELET
Basophils Absolute: 0 10*3/uL (ref 0.0–0.1)
Eosinophils Absolute: 0.2 10*3/uL (ref 0.0–0.5)
HCT: 38.6 % (ref 34.8–46.6)
LYMPH%: 27.3 % (ref 14.0–49.7)
MCV: 98.3 fL (ref 79.5–101.0)
MONO%: 8.8 % (ref 0.0–14.0)
NEUT#: 1.7 10*3/uL (ref 1.5–6.5)
NEUT%: 55.3 % (ref 38.4–76.8)
Platelets: 181 10*3/uL (ref 145–400)
RBC: 3.93 10*6/uL (ref 3.70–5.45)

## 2012-04-25 NOTE — Progress Notes (Signed)
   [facility]    OFFICE PROGRESS NOTE   INTERVAL HISTORY:   She returns as scheduled. She is working. The bowels are functioning wall using a stool softener. She has occasional blood on the toilet paper. She relates this to hemorrhoids.  Objective:  Vital signs in last 24 hours:  Blood pressure 131/71, pulse 80, temperature 97.7 F (36.5 C), temperature source Oral, height 5\' 4"  (1.626 m), weight 139 lb 8 oz (63.277 kg).    HEENT: Neck without Lymphatics: No cervical, supraclavicular, axillary, or inguinal nodes Resp: Lungs clear bilaterally Cardio: Regular rate and rhythm GI: No hepatosplenomegaly, no mass, nontender. Vascular: No leg edema Rectal: There are hemorrhoids surrounding the anal verge, the rectum appears stenotic. I was unable to pass a finger beyond approximately 1 cm proximal to the anal verge . No nodules surrounding the anal verge.   Lab Results:  Lab Results  Component Value Date   WBC 3.0* 04/25/2012   HGB 13.1 04/25/2012   HCT 38.6 04/25/2012   MCV 98.3 04/25/2012   PLT 181 04/25/2012   ANC 1.7    Medications: I have reviewed the patient's current medications.  Assessment/Plan: 1. Poorly differentiated carcinoma, basaloid squamous cell carcinoma, of the anal canal and posterior vagina. Staging CT scans of the abdomen and pelvis on 11/15/2011 with suspicious perirectal lymph node and no evidence of distant metastatic disease. She began radiation 11/29/2011. She completed cycle 1 mitomycin and 5 fluorouracil 11/29/2011. She completed cycle 2 beginning 12/27/2011. She completed the course of radiation on 01/05/2012. 2. Rectal bleeding secondary to #1, improved. 3. Erythema and desquamation related to radiation. Resolved 4. Anemia. Resolved 5. Status post placement right upper the PICC line 11/29/2011. The PICC line has been removed. 6. Depression.  7. Recent urinary tract infection 8. Status post evaluation in the emergency Department on  02/09/2012 for shortness of breath. Chest CT was negative for evidence of a pulmonary embolism or other acute finding. She has had no further shortness of breath. 9. Rectal stenosis-confirmed on exam today and by Dr. Dayton Scrape. I will discuss the indication for an examination under anesthesia with Dr. Derrell Lolling  Disposition:  She remains in clinical remission from the anal cancer. She will return for an office visit in 4 months. Ms. Crumby will see Dr. Dayton Scrape in the interim. We will refer her to Dr. Derrell Lolling for a repeat examination and rectal dilatation as indicated.   Thornton Papas, MD  04/25/2012  3:57 PM

## 2012-04-25 NOTE — Telephone Encounter (Signed)
Gave pt appt calendar for October 2013 ML

## 2012-04-26 ENCOUNTER — Encounter: Payer: Self-pay | Admitting: *Deleted

## 2012-04-26 NOTE — Progress Notes (Signed)
[facility] Brief Psychosocial Assessment Clinical Social Work  Clinical Social Work was referred by patient navigator for assessment of psychosocial needs.  Clinical Social Worker contacted patient at home to offer support and assess for needs.  Pt stated that her Medicare coverage is scheduled to start next month, and she had questions regarding her coverage.  Pt also had [employer], and was questioning if her insurance plans would cover out patient procedures.  CSW informed pt that Medicare traditionally covers 80%, but could differ based on plan.  CSW referred pt to the Financial Advocate for additional information on insurance coverage's.  Pt also had questions regarding coverage for procedures at offices not associated with [facility].  CSW encouraged pt to contact the offices directly with questions regarding her insurance and cost of procedures.  Pt verbalized understanding and stated she planned to make contact.  CSW contacted financial advocate with pt's questions and concerns, and requested financial advocate to follow up.    [name], MSW, LCSW Clinical Social Worker [facility] [phone]

## 2012-04-27 ENCOUNTER — Telehealth: Payer: Self-pay | Admitting: Nurse Practitioner

## 2012-04-27 ENCOUNTER — Other Ambulatory Visit: Payer: Self-pay | Admitting: Nurse Practitioner

## 2012-04-27 DIAGNOSIS — K624 Stenosis of anus and rectum: Secondary | ICD-10-CM

## 2012-04-27 NOTE — Telephone Encounter (Signed)
Scheduled appt for [name] to see Dr. Derrell Lolling 7/25 @ 1115.  Spoke with patient.  [name] wrote down and repeated appt information.

## 2012-05-09 ENCOUNTER — Ambulatory Visit: Payer: BC Managed Care – PPO | Admitting: Radiation Oncology

## 2012-05-12 ENCOUNTER — Encounter: Payer: Self-pay | Admitting: Radiation Oncology

## 2012-05-23 ENCOUNTER — Ambulatory Visit
Admission: RE | Admit: 2012-05-23 | Discharge: 2012-05-23 | Disposition: A | Discharge status: Home / Self Care | Payer: BC Managed Care – PPO | Source: Ambulatory Visit | Attending: Radiation Oncology | Admitting: Radiation Oncology

## 2012-05-23 VITALS — BP 143/83 | HR 76 | Temp 98.2°F | Wt 142.7 lb

## 2012-05-23 DIAGNOSIS — C211 Malignant neoplasm of anal canal: Secondary | ICD-10-CM

## 2012-05-23 NOTE — Progress Notes (Signed)
Patient here for routine follow up post anal cancer radiation.Has hemorrhoidal discomfort on wiping after bowel movements.States there is small amount of bleeding,Takes 2 stool softeners daily.Otherwise patient has been doing great.

## 2012-05-23 NOTE — Progress Notes (Signed)
Followup note:  The patient returns today approximately 4-1/2 months following completion of chemoradiation in the management of her T4 N0 squamous cell carcinoma of the anal canal. She continues to do well. She tells me she'll see Dr. Derrell Lolling later this week. He has not seen her since completion of her radiation therapy. She uses a stool softener daily, and reports having an external hemorrhoid which occasionally bleeds. Otherwise no GI or GU difficulties.  Physical examination: She is in good spirits. Oral signs: Wt Readings from Last 3 Encounters:  05/23/12 142 lb 11.2 oz (64.728 kg)  04/25/12 139 lb 8 oz (63.277 kg)  03/14/12 140 lb 1.6 oz (63.549 kg)   Temp Readings from Last 3 Encounters:  05/23/12 98.2 F (36.8 C)   04/25/12 97.7 F (36.5 C) Oral  03/14/12 97.5 F (36.4 C) Oral   BP Readings from Last 3 Encounters:  05/23/12 143/83  04/25/12 131/71  03/14/12 141/73   Pulse Readings from Last 3 Encounters:  05/23/12 76  04/25/12 80  03/14/12 80   Head and neck examination: Grossly unremarkable. Nodes: Without palpable cervical, supraclavicular, or inguinal/femoral lymphadenopathy. Chest: Lungs clear. Back: Without spinal or CVA tenderness. Abdomen: Soft without masses organomegaly. Rectal: There are external hemorrhoids. There is moderate to severe anal canal stenosis and my examination digit is unable to palpate the proximal or mid anal canal. Extremities: Without edema.  Impression: Satisfactory progress. I'm unable to fully assess her anal canal secondary to stenosis. I'm cautiously optimistic.  Plan: She'll see Dr. Derrell Lolling this Thursday. At the very least, she will require dilatation of her anal canal. I defer to Dr. Derrell Lolling if he feels the need to perform a biopsy to confirm a complete response to her therapy. She'll see Dr. Truett Perna in October and I will see her back in 6 months.

## 2012-05-25 ENCOUNTER — Encounter (INDEPENDENT_AMBULATORY_CARE_PROVIDER_SITE_OTHER): Payer: Self-pay | Admitting: General Surgery

## 2012-05-25 ENCOUNTER — Ambulatory Visit (INDEPENDENT_AMBULATORY_CARE_PROVIDER_SITE_OTHER): Payer: BC Managed Care – PPO | Admitting: General Surgery

## 2012-05-25 VITALS — BP 144/92 | HR 100 | Temp 97.4°F | Resp 18 | Ht 65.0 in | Wt 143.0 lb

## 2012-05-25 DIAGNOSIS — C211 Malignant neoplasm of anal canal: Secondary | ICD-10-CM

## 2012-05-25 NOTE — Progress Notes (Signed)
Patient ID: [name], female   DOB: 01/30/1946, 66 y.o.   MRN: [id number]  Chief Complaint  Patient presents with  . Rectal Problems    stenosis    HPI [name] is a 66 y.o. female.  She was referred back to me by Dr. Mancel Bale for reevaluation of her anal canal carcinoma.  This patient presented with hemorrhoids in January. On January 19 she was taken to operating room and we found a neoplastic mass in the rectovaginal septum. 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 She underwent radiation therapy from January 28 through March of 2013. She had severe desquamation. That has gotten better. She also had mitomycin C. And 5-FU chemotherapy, at least 2 courses.  Currently she feels pretty good. She takes stool softeners 2 or 3 times a day and is having bowel movements without any difficulty. She does not have to strain. She does not have any pain. She does have some external hemorrhoids stasis he passes a little but of blood from time to time.  Dr. Truett Perna and Dr. Dayton Scrape noted that she had anal canal stenosis and she was referred back to me for evaluation and examination. HPI  Past Medical History  Diagnosis Date  . Depression   . Hyperlipidemia   . Vitamin d deficiency   . Anxiety   . Rectal bleeding   . anal ca dx'd 11/2011    squamous cell carcinoma  . History of radiation therapy 11/29/11 to 01/05/12    anal canal  . Hemorrhoids     external    Past Surgical History  Procedure Date  . Hemorrhoid surgery 1980    Patient had to guess on the date.  . Abdominal hysterectomy 1978    age 20  . Tubal ligation     b/l  age 21    Family History  Problem Relation Age of Onset  . Cancer Sister     breast/ age 33  . Alcohol abuse Maternal Uncle   . Cervical cancer Sister     20    Social History History  Substance Use Topics  . Smoking status: Former Smoker --  0.5 packs/day for 25 years    Types: Cigarettes    Quit date: 09/07/1987  . Smokeless tobacco: Never Used  . Alcohol Use: No    No Known Allergies  Current Outpatient Prescriptions  Medication Sig Dispense Refill  . busPIRone (BUSPAR) 10 MG tablet Take 10 mg by mouth daily.       . calcium carbonate (TUMS - DOSED IN MG ELEMENTAL CALCIUM) 500 MG chewable tablet Chew 1 tablet by mouth 2 (two) times daily.      . cholecalciferol (VITAMIN D) 1000 UNITS tablet Take 1,000 Units by mouth daily.      . DULoxetine (CYMBALTA) 60 MG capsule Take 60 mg by mouth daily.      . ferrous sulfate 325 (65 FE) MG tablet Take 325 mg by mouth 2 (two) times daily.      . folic acid (FOLVITE) 400 MCG tablet Take 400 mcg by mouth daily.      . metroNIDAZOLE (FLAGYL) 500 MG tablet Take 500 mg by mouth 2 (two) times daily.      . niacin 250 MG tablet Take 250 mg by mouth daily with breakfast.      . pravastatin (PRAVACHOL) 40 MG tablet Take 40 mg by mouth  daily with breakfast.        No current facility-administered medications for this visit.   Facility-Administered Medications Ordered in Other Visits  Medication Dose Route Frequency Provider Last Rate Last Dose  . sodium chloride 0.9 % injection 10 mL  10 mL Intracatheter PRN Ladene Artist, MD        Review of Systems Review of Systems  Constitutional: Negative for fever, chills and unexpected weight change.  HENT: Negative for hearing loss, congestion, sore throat, trouble swallowing and voice change.   Eyes: Negative for visual disturbance.  Respiratory: Negative for cough and wheezing.   Cardiovascular: Negative for chest pain, palpitations and leg swelling.  Gastrointestinal: Positive for anal bleeding. Negative for nausea, vomiting, abdominal pain, diarrhea, constipation, blood in stool and abdominal distention.  Genitourinary: Negative for hematuria, vaginal bleeding and difficulty urinating.  Musculoskeletal: Negative for arthralgias.  Skin:  Negative for rash and wound.  Neurological: Negative for seizures, syncope and headaches.  Hematological: Negative for adenopathy. Does not bruise/bleed easily.  Psychiatric/Behavioral: Negative for confusion.    Blood pressure 144/92, pulse 100, temperature 97.4 F (36.3 C), temperature source Temporal, resp. rate 18, height 5\' 5"  (1.651 m), weight 143 lb (64.864 kg).  Physical Exam Physical Exam  Constitutional: She is oriented to person, place, and time. She appears well-developed and well-nourished. No distress.  HENT:  Head: Normocephalic and atraumatic.  Nose: Nose normal.  Mouth/Throat: No oropharyngeal exudate.  Eyes: Conjunctivae and EOM are normal. Pupils are equal, round, and reactive to light. Left eye exhibits no discharge. No scleral icterus.  Neck: Neck supple. No JVD present. No tracheal deviation present. No thyromegaly present.  Cardiovascular: Normal rate, regular rhythm, normal heart sounds and intact distal pulses.   No murmur heard. Pulmonary/Chest: Effort normal and breath sounds normal. No respiratory distress. She has no wheezes. She has no rales. She exhibits no tenderness.  Abdominal: Soft. Bowel sounds are normal. She exhibits no distension and no mass. There is no tenderness. There is no rebound and no guarding.       Well healed Pfannenstiel incision very  Genitourinary:       Anal exam reveals a couple of firm smooth hemorrhoidal tags on the left side. Digitally rectal exam is consistent with anal canal stenosis, I could not get my little finger through the anal canal. I did not see any obvious neoplastic mass but the tissues were firm. Palpation of the posterior vaginal wall revealed some thickening in the rectovaginal septum but the mucosa feels intact.No inguinal adenopathy.  Musculoskeletal: She exhibits no edema and no tenderness.  Lymphadenopathy:    She has no cervical adenopathy.  Neurological: She is alert and oriented to person, place, and time.  She exhibits normal muscle tone. Coordination normal.  Skin: Skin is warm. No rash noted. She is not diaphoretic. No erythema. No pallor.  Psychiatric: She has a normal mood and affect. Her behavior is normal. Judgment and thought content normal.    Data Reviewed I have discussed her case with Dr. Truett Perna. I reviewed his meds and Dr. Rennie Plowman minutes.  Assessment    Poorly differentiated, basaloid squamous cell cancer of the anus involving the root rectovaginal and anovaginal septum. Status post biopsy, then primary chemotherapy and primary radiation therapy.  Anal canal stenosis, not surprising considering the neoplastic process, and the radiation therapy. Surprisingly, she is not symptomatic from this.  External hemorrhoidal tags. Thickened and fibrotic, presumably secondary to radiation therapy    Plan  I have advised this patient to undergo examination under anesthesia, gentle anal canal dilatation, and biopsy of any suspicious areas to be sure there is no residual disease. She understands the indication and need for this.  I discussed the indication, and details, techniques, and numerous complications and risks  of this type of surgery with her. She understands these issues. Her questions were answered. She agrees with this plan.  Rectal prep preop.       Angelia Mould. Derrell Lolling, M.D., [facility] General and Minimally invasive Surgery Breast and Colorectal Surgery Office:   [phone] Pager:   [phone]  05/25/2012, 12:23 PM

## 2012-05-25 NOTE — Patient Instructions (Signed)
You'll be scheduled for a minor surgery to reevaluate the effects of the treatment of your anal canal cancer.  You'll be given a rectal prep to clean yourself and out the night before.  We will plan to slowly stretch your anal canal,  look around, and do some biopsies.    you will be able to go home the same day.

## 2012-06-09 ENCOUNTER — Telehealth: Payer: Self-pay | Admitting: *Deleted

## 2012-06-09 NOTE — Telephone Encounter (Signed)
Call from pt requesting to move her office visit from 10/25 to 10/18. Request forwarded to schedulers.

## 2012-06-12 ENCOUNTER — Telehealth: Payer: Self-pay | Admitting: Oncology

## 2012-06-12 NOTE — Telephone Encounter (Signed)
lmonvm adviisng the pt of her r/s appt from 08/25/2012 to 08/18/2012 per pt's request

## 2012-06-15 ENCOUNTER — Encounter (HOSPITAL_BASED_OUTPATIENT_CLINIC_OR_DEPARTMENT_OTHER): Payer: Self-pay | Admitting: *Deleted

## 2012-06-15 NOTE — Progress Notes (Signed)
Pt had surg 1/13-chemo-radiation- No more labs needed

## 2012-06-20 NOTE — H&P (Signed)
[name]     MRN: [id number]   Description: 66 year old female  Provider: [name], MD  Department: [facility]       Diagnoses     Malignant neoplasm of anal canal   - Primary    154.2      Reason for Visit     Rectal Problems    stenosis       Vitals -    BP Pulse Temp Resp Ht Wt    144/92 100 97.4 F (36.3 C) (Temporal) 18 5\' 5"  (1.651 m) 143 lb (64.864 kg)     BMI - 23.80 kg/m2                  History and Physical   [name], MD Patient ID: [name], female   DOB: 1946/07/15, 66 y.o.   MRN: [id number]              HPI [name] is a 66 y.o. female.  She was referred back to me by Dr. Mancel Bale for reevaluation of her anal canal carcinoma.   This patient presented with hemorrhoids in January. On January 19 she was taken to operating room and we found a neoplastic mass in the rectovaginal septum. 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 She underwent radiation therapy from January 28 through March of 2013. She had severe desquamation. That has gotten better. She also had mitomycin C. And 5-FU chemotherapy, at least 2 courses.   Currently she feels pretty good. She takes stool softeners 2 or 3 times a day and is having bowel movements without any difficulty. She does not have to strain. She does not have any pain. She does have some external hemorrhoids stasis he passes a little but of blood from time to time.   Dr. Truett Perna and Dr. Dayton Scrape noted that she had anal canal stenosis and she was referred back to me for evaluation and examination.       Past Medical History   Diagnosis  Date   .  Depression     .  Hyperlipidemia     .  Vitamin d deficiency     .  Anxiety     .  Rectal bleeding     .  anal ca  dx'd 11/2011       squamous cell carcinoma   .  History of radiation therapy  11/29/11 to 01/05/12    anal canal   .  Hemorrhoids         external       Past Surgical History   Procedure  Date   .  Hemorrhoid surgery  1980       Patient had to guess on the date.   .  Abdominal hysterectomy  1978       age 89   .  Tubal ligation         b/l  age 26       Family History   Problem  Relation  Age of Onset   .  Cancer  Sister         breast/ age 9   .  Alcohol abuse  Maternal Uncle     .  Cervical cancer  Sister         55      Social History History   Substance Use  Topics   .  Smoking status:  Former Smoker -- 0.5 packs/day for 25 years       Types:  Cigarettes       Quit date:  09/07/1987   .  Smokeless tobacco:  Never Used   .  Alcohol Use:  No      No Known Allergies    Current Outpatient Prescriptions   Medication  Sig  Dispense  Refill   .  busPIRone (BUSPAR) 10 MG tablet  Take 10 mg by mouth daily.          .  calcium carbonate (TUMS - DOSED IN MG ELEMENTAL CALCIUM) 500 MG chewable tablet  Chew 1 tablet by mouth 2 (two) times daily.         .  cholecalciferol (VITAMIN D) 1000 UNITS tablet  Take 1,000 Units by mouth daily.         .  DULoxetine (CYMBALTA) 60 MG capsule  Take 60 mg by mouth daily.         .  ferrous sulfate 325 (65 FE) MG tablet  Take 325 mg by mouth 2 (two) times daily.         .  folic acid (FOLVITE) 400 MCG tablet  Take 400 mcg by mouth daily.         .  metroNIDAZOLE (FLAGYL) 500 MG tablet  Take 500 mg by mouth 2 (two) times daily.         .  niacin 250 MG tablet  Take 250 mg by mouth daily with breakfast.         .  pravastatin (PRAVACHOL) 40 MG tablet  Take 40 mg by mouth daily with breakfast.              No current facility-administered medications for this visit.       Facility-Administered Medications Ordered in Other Visits   Medication  Dose  Route  Frequency  Provider  Last Rate  Last Dose   .  sodium chloride 0.9 % injection 10 mL   10 mL  Intracatheter  PRN  Ladene Artist, MD            Review of Systems     Constitutional: Negative for fever, chills and unexpected weight change.  HENT: Negative for hearing loss, congestion, sore throat, trouble swallowing and voice change.   Eyes: Negative for visual disturbance.  Respiratory: Negative for cough and wheezing.   Cardiovascular: Negative for chest pain, palpitations and leg swelling.  Gastrointestinal: Positive for anal bleeding. Negative for nausea, vomiting, abdominal pain, diarrhea, constipation, blood in stool and abdominal distention.  Genitourinary: Negative for hematuria, vaginal bleeding and difficulty urinating.  Musculoskeletal: Negative for arthralgias.  Skin: Negative for rash and wound.  Neurological: Negative for seizures, syncope and headaches.  Hematological: Negative for adenopathy. Does not bruise/bleed easily.  Psychiatric/Behavioral: Negative for confusion.    Blood pressure 144/92, pulse 100, temperature 97.4 F (36.3 C), temperature source Temporal, resp. rate 18, height 5\' 5"  (1.651 m), weight 143 lb (64.864 kg).   Physical Exam   Constitutional: She is oriented to person, place, and time. She appears well-developed and well-nourished. No distress.  HENT:   Head: Normocephalic and atraumatic.   Nose: Nose normal.   Mouth/Throat: No oropharyngeal exudate.  Eyes: Conjunctivae and EOM are normal. Pupils are equal, round, and reactive to light. Left eye exhibits no discharge. No scleral icterus.  Neck: Neck supple. No JVD present. No tracheal deviation present. No  thyromegaly present.  Cardiovascular: Normal rate, regular rhythm, normal heart sounds and intact distal pulses.    No murmur heard. Pulmonary/Chest: Effort normal and breath sounds normal. No respiratory distress. She has no wheezes. She has no rales. She exhibits no tenderness.  Abdominal: Soft. Bowel sounds are normal. She exhibits no distension and no mass. There is no tenderness. There is no rebound and no guarding.       Well healed Pfannenstiel incision  very  Genitourinary:       Anal exam reveals a couple of firm smooth hemorrhoidal tags on the left side. Digitally rectal exam is consistent with anal canal stenosis, I could not get my little finger through the anal canal. I did not see any obvious neoplastic mass but the tissues were firm. Palpation of the posterior vaginal wall revealed some thickening in the rectovaginal septum but the mucosa feels intact.No inguinal adenopathy.  Musculoskeletal: She exhibits no edema and no tenderness.  Lymphadenopathy:    She has no cervical adenopathy.  Neurological: She is alert and oriented to person, place, and time. She exhibits normal muscle tone. Coordination normal.  Skin: Skin is warm. No rash noted. She is not diaphoretic. No erythema. No pallor.  Psychiatric: She has a normal mood and affect. Her behavior is normal. Judgment and thought content normal.    Data Reviewed I have discussed her case with Dr. Truett Perna. I reviewed his meds and Dr. Rennie Plowman minutes.   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 Status post biopsy, then primary chemotherapy and primary radiation therapy.   Anal canal stenosis, not surprising considering the neoplastic process, and the radiation therapy. Surprisingly, she is not symptomatic from this.   External hemorrhoidal tags. Thickened and fibrotic, presumably secondary to radiation therapy   Plan I have advised this patient to undergo examination under anesthesia, gentle anal canal dilatation, and biopsy of any suspicious areas to be sure there is no residual disease. She understands the indication and need for this.   I discussed the indication, and details, techniques, and numerous complications and risks  of this type of surgery with her. She understands these issues. Her questions were answered. She agrees with this plan.   Rectal prep preop.       Angelia Mould.  Derrell Lolling, M.D., [facility] General and Minimally invasive Surgery Breast and Colorectal Surgery Office:   [phone] Pager:   [phone]

## 2012-06-21 ENCOUNTER — Ambulatory Visit (HOSPITAL_BASED_OUTPATIENT_CLINIC_OR_DEPARTMENT_OTHER): Payer: BC Managed Care – PPO | Admitting: Anesthesiology

## 2012-06-21 ENCOUNTER — Encounter (HOSPITAL_BASED_OUTPATIENT_CLINIC_OR_DEPARTMENT_OTHER): Payer: Self-pay | Admitting: Anesthesiology

## 2012-06-21 ENCOUNTER — Encounter (HOSPITAL_BASED_OUTPATIENT_CLINIC_OR_DEPARTMENT_OTHER): Payer: Self-pay | Admitting: Certified Registered"

## 2012-06-21 ENCOUNTER — Encounter (HOSPITAL_BASED_OUTPATIENT_CLINIC_OR_DEPARTMENT_OTHER): Payer: Self-pay

## 2012-06-21 ENCOUNTER — Ambulatory Visit (HOSPITAL_BASED_OUTPATIENT_CLINIC_OR_DEPARTMENT_OTHER)
Admission: RE | Admit: 2012-06-21 | Discharge: 2012-06-21 | Disposition: A | Discharge status: Home / Self Care | Payer: BC Managed Care – PPO | Source: Ambulatory Visit | Attending: General Surgery | Admitting: General Surgery

## 2012-06-21 ENCOUNTER — Encounter (HOSPITAL_BASED_OUTPATIENT_CLINIC_OR_DEPARTMENT_OTHER)
Admission: RE | Disposition: A | Discharge status: Home / Self Care | Payer: Self-pay | Source: Ambulatory Visit | Attending: General Surgery

## 2012-06-21 DIAGNOSIS — Z85048 Personal history of other malignant neoplasm of rectum, rectosigmoid junction, and anus: Secondary | ICD-10-CM | POA: Insufficient documentation

## 2012-06-21 DIAGNOSIS — F3289 Other specified depressive episodes: Secondary | ICD-10-CM | POA: Insufficient documentation

## 2012-06-21 DIAGNOSIS — K644 Residual hemorrhoidal skin tags: Secondary | ICD-10-CM

## 2012-06-21 DIAGNOSIS — C211 Malignant neoplasm of anal canal: Secondary | ICD-10-CM | POA: Diagnosis present

## 2012-06-21 DIAGNOSIS — K624 Stenosis of anus and rectum: Secondary | ICD-10-CM | POA: Insufficient documentation

## 2012-06-21 DIAGNOSIS — F411 Generalized anxiety disorder: Secondary | ICD-10-CM | POA: Insufficient documentation

## 2012-06-21 DIAGNOSIS — F329 Major depressive disorder, single episode, unspecified: Secondary | ICD-10-CM | POA: Insufficient documentation

## 2012-06-21 HISTORY — DX: Complete loss of teeth, unspecified cause, unspecified class: K08.109

## 2012-06-21 HISTORY — DX: Presence of spectacles and contact lenses: Z97.3

## 2012-06-21 HISTORY — DX: Complete loss of teeth, unspecified cause, unspecified class: Z97.2

## 2012-06-21 HISTORY — PX: RECTAL BIOPSY: SHX2303

## 2012-06-21 HISTORY — PX: EXAMINATION UNDER ANESTHESIA: SHX1540

## 2012-06-21 SURGERY — EXAM UNDER ANESTHESIA
Anesthesia: General | Site: Rectum | Wound class: Clean Contaminated

## 2012-06-21 MED ORDER — BUPIVACAINE LIPOSOME 1.3 % IJ SUSP
INTRAMUSCULAR | Status: DC | PRN
  Administered 2012-06-21: 15 mL

## 2012-06-21 MED ORDER — HYDROCODONE-ACETAMINOPHEN 5-325 MG PO TABS: 1.0000 | ORAL_TABLET | ORAL | Status: AC | PRN

## 2012-06-21 MED ORDER — DEXTROSE 5 % IV SOLN
2.0000 g | INTRAVENOUS | Status: AC
  Administered 2012-06-21: 2 g via INTRAVENOUS

## 2012-06-21 MED ORDER — PROPOFOL 10 MG/ML IV EMUL
INTRAVENOUS | Status: DC | PRN
  Administered 2012-06-21: 130 mg via INTRAVENOUS

## 2012-06-21 MED ORDER — ONDANSETRON HCL 4 MG/2ML IJ SOLN
INTRAMUSCULAR | Status: DC | PRN
  Administered 2012-06-21: 4 mg via INTRAVENOUS

## 2012-06-21 MED ORDER — FENTANYL CITRATE 0.05 MG/ML IJ SOLN
INTRAMUSCULAR | Status: DC | PRN
  Administered 2012-06-21 (×3): 50 ug via INTRAVENOUS

## 2012-06-21 MED ORDER — MIDAZOLAM HCL 5 MG/5ML IJ SOLN
INTRAMUSCULAR | Status: DC | PRN
  Administered 2012-06-21: 1.5 mg via INTRAVENOUS

## 2012-06-21 MED ORDER — DEXAMETHASONE SODIUM PHOSPHATE 4 MG/ML IJ SOLN
INTRAMUSCULAR | Status: DC | PRN
  Administered 2012-06-21: 8 mg via INTRAVENOUS

## 2012-06-21 MED ORDER — HEPARIN SODIUM (PORCINE) 5000 UNIT/ML IJ SOLN
5000.0000 [IU] | Freq: Once | INTRAMUSCULAR | Status: AC
  Administered 2012-06-21: 5000 [IU] via SUBCUTANEOUS

## 2012-06-21 MED ORDER — LIDOCAINE HCL (CARDIAC) 20 MG/ML IV SOLN
INTRAVENOUS | Status: DC | PRN
  Administered 2012-06-21: 15 mg via INTRAVENOUS

## 2012-06-21 MED ORDER — GLYCOPYRROLATE 0.2 MG/ML IJ SOLN
INTRAMUSCULAR | Status: DC | PRN
  Administered 2012-06-21: 0.1 mg via INTRAVENOUS

## 2012-06-21 MED ORDER — LACTATED RINGERS IV SOLN
INTRAVENOUS | Status: DC
  Administered 2012-06-21: 09:00:00 via INTRAVENOUS

## 2012-06-21 MED ORDER — CHLORHEXIDINE GLUCONATE 4 % EX LIQD: 1.0000 "application " | Freq: Once | CUTANEOUS | Status: DC

## 2012-06-21 SURGICAL SUPPLY — 35 items
BLADE SURG 15 STRL LF DISP TIS (BLADE) ×1 IMPLANT
BLADE SURG 15 STRL SS (BLADE) ×1
BRIEF STRETCH FOR OB PAD LRG (UNDERPADS AND DIAPERS) ×2 IMPLANT
CANISTER SUCTION 1200CC (MISCELLANEOUS) ×2 IMPLANT
CLEANER CAUTERY TIP 5X5 PAD (MISCELLANEOUS) IMPLANT
COVER MAYO STAND STRL (DRAPES) ×2 IMPLANT
DECANTER SPIKE VIAL GLASS SM (MISCELLANEOUS) ×2 IMPLANT
DRSG PAD ABDOMINAL 8X10 ST (GAUZE/BANDAGES/DRESSINGS) ×2 IMPLANT
ELECT REM PT RETURN 9FT ADLT (ELECTROSURGICAL) ×2
ELECTRODE REM PT RTRN 9FT ADLT (ELECTROSURGICAL) ×1 IMPLANT
GAUZE PACKING IODOFORM 1/2 (PACKING) ×2 IMPLANT
GAUZE SPONGE 4X4 12PLY STRL LF (GAUZE/BANDAGES/DRESSINGS) ×8 IMPLANT
GLOVE EUDERMIC 7 POWDERFREE (GLOVE) ×2 IMPLANT
GOWN PREVENTION PLUS XLARGE (GOWN DISPOSABLE) ×2 IMPLANT
GOWN PREVENTION PLUS XXLARGE (GOWN DISPOSABLE) ×2 IMPLANT
HEMOSTAT SURGICEL 2X14 (HEMOSTASIS) IMPLANT
NEEDLE HYPO 25X1 1.5 SAFETY (NEEDLE) ×2 IMPLANT
NS IRRIG 1000ML POUR BTL (IV SOLUTION) ×2 IMPLANT
PACK BASIN DAY SURGERY FS (CUSTOM PROCEDURE TRAY) ×2 IMPLANT
PACK LITHOTOMY IV (CUSTOM PROCEDURE TRAY) ×2 IMPLANT
PAD CLEANER CAUTERY TIP 5X5 (MISCELLANEOUS)
PENCIL BUTTON HOLSTER BLD 10FT (ELECTRODE) ×2 IMPLANT
SHEET MEDIUM DRAPE 40X70 STRL (DRAPES) IMPLANT
SLEEVE SCD COMPRESS KNEE MED (MISCELLANEOUS) IMPLANT
SPONGE SURGIFOAM ABS GEL 12-7 (HEMOSTASIS) IMPLANT
SURGILUBE 2OZ TUBE FLIPTOP (MISCELLANEOUS) ×8 IMPLANT
SYR BULB IRRIGATION 50ML (SYRINGE) ×2 IMPLANT
SYR CONTROL 10ML LL (SYRINGE) ×2 IMPLANT
TOWEL OR 17X24 6PK STRL BLUE (TOWEL DISPOSABLE) ×4 IMPLANT
TRAY DSU PREP LF (CUSTOM PROCEDURE TRAY) ×2 IMPLANT
TRAY PROCTOSCOPIC FIBER OPTIC (SET/KITS/TRAYS/PACK) ×2 IMPLANT
TUBE CONNECTING 20X1/4 (TUBING) ×2 IMPLANT
UNDERPAD 30X30 INCONTINENT (UNDERPADS AND DIAPERS) ×2 IMPLANT
WATER STERILE IRR 1000ML POUR (IV SOLUTION) ×2 IMPLANT
YANKAUER SUCT BULB TIP NO VENT (SUCTIONS) ×2 IMPLANT

## 2012-06-21 NOTE — Op Note (Signed)
Patient Name:           [name]   Date of Surgery:        06/21/2012  Pre op Diagnosis:      Anal canal stenosis, history chemotherapy and radiation therapy for squamous cell cancer of the anal canal.  Post op Diagnosis:    same  Procedure:                 Examination under anesthesia, dilatation of anal canal, biopsy posterior vaginal wall, excision external hemorrhoid, left posterior, biopsy anterior wall anal canal.  Surgeon:                     Angelia Mould. Derrell Lolling, M.D., FACS  Assistant:                      none  Operative Indications:   [name] is a 66 y.o. female. She was referred back to me by Dr. Mancel Bale for reevaluation of her anal canal carcinoma.  This patient presented with hemorrhoids in January. On January 19 she was taken to operating room and we found a neoplastic mass in the rectovaginal septum. 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 She underwent radiation therapy from January 28 through March of 2013. She had severe desquamation. That has gotten better. She also had mitomycin C. And 5-FU chemotherapy, at least 2 courses. Currently she feels pretty good. She takes stool softeners 2 or 3 times a day and is having bowel movements without any difficulty. She does not have to strain. She does not have any pain. She does have some external hemorrhoids stasis he passes a little but of blood from time to time.  Dr. Truett Perna and Dr. Dayton Scrape noted that she had anal canal stenosis and she was referred back to me for evaluation and examination.   Operative Findings:       Anal canal the canal was stenotic but I was able to dilate it from 8 mm diameter to 2.25 cm diameter. There was some vague thickening in the rectovaginal septum but no obvious active tumor. The vaginal mucosa appeared intact. There were some scar tissue on the anterior wall of the anal canal which was  biopsied. There were chronic external hemorrhoids left anterior and left posterior. I chose to excise the smaller hemorrhoid left posterior position for histology.  Procedure in Detail:  Following the induction of general endotracheal anesthesia, surgical time out was performed, the patient positioned in lithotomy position, and the perianal and vaginal tissues prepped and draped in a sterile fashion. A 50% solution of Exparel was injected into the perianal and perirectal sphincter circumferentially.  External examination revealed findings as described above. There was some chronically thickened tissue on the posterior vaginal wall but the mucosa appeared intact and this was not obvious tumor. I took a small biopsy of the posterior vaginal wall and cauterized the base. I then used  sequential Hagar dilators to dilate the anal canal from 8 mm up to 2.25 cm. I was then able to put small anoscopes and my finger in and examined the anal canal in the posterior and anterior rectal walls. The rectal mucosa looked normal. I took a pinch biopsy of the anterior wall of the anal canal. Using the cautery I excised a small external hemorrhoid left posterior. All these areas were cauterized. I dilated  the anal canal one more time. Surgicel gauze was placed in the anal canal. There was no bleeding. External bandages were placed. The patient was taken to the recovery room in stable condition. EBL 10 cc. Counts correct.              Angelia Mould. Derrell Lolling, M.D., FACS General and Minimally Invasive Surgery Breast and Colorectal Surgery  06/21/2012 10:49 AM

## 2012-06-21 NOTE — Anesthesia Postprocedure Evaluation (Signed)
  Anesthesia Post-op Note  [name]  Procedure(s) Performed: Procedure(s) (LRB): EXAM UNDER ANESTHESIA (N/A) BIOPSY RECTAL (N/A)  Patient Location: PACU  Anesthesia Type: General  Level of Consciousness: awake, alert  and oriented  Airway and Oxygen Therapy: Patient Spontanous Breathing  Post-op Pain: none  Post-op Assessment: Post-op Vital signs reviewed, Patient's Cardiovascular Status Stable, Respiratory Function Stable, Patent Airway, No signs of Nausea or vomiting and Pain level controlled  Post-op Vital Signs: Reviewed and stable  Complications: No apparent anesthesia complications

## 2012-06-21 NOTE — Anesthesia Preprocedure Evaluation (Signed)
Anesthesia Evaluation  Patient identified by MRN, date of birth, ID band Patient awake    Reviewed: Allergy & Precautions, H&P , NPO status , Patient's Chart, lab work & pertinent test results  History of Anesthesia Complications Negative for: history of anesthetic complications  Airway Mallampati: I TM Distance: >3 FB Neck ROM: Full    Dental No notable dental hx. (+) Edentulous Upper and Edentulous Lower   Pulmonary neg pulmonary ROS, former smoker,  breath sounds clear to auscultation  Pulmonary exam normal       Cardiovascular negative cardio ROS  Rhythm:Regular Rate:Normal     Neuro/Psych PSYCHIATRIC DISORDERS Anxiety Depression negative neurological ROS     GI/Hepatic Neg liver ROS, Anal Cancer: rx XRT, chemo   Endo/Other  negative endocrine ROS  Renal/GU negative Renal ROS     Musculoskeletal   Abdominal   Peds  Hematology   Anesthesia Other Findings   Reproductive/Obstetrics                           Anesthesia Physical Anesthesia Plan  ASA: II  Anesthesia Plan: General   Post-op Pain Management:    Induction: Intravenous  Airway Management Planned: LMA  Additional Equipment:   Intra-op Plan:   Post-operative Plan:   Informed Consent: I have reviewed the patients History and Physical, chart, labs and discussed the procedure including the risks, benefits and alternatives for the proposed anesthesia with the patient or authorized representative who has indicated his/her understanding and acceptance.     Plan Discussed with: CRNA and Surgeon  Anesthesia Plan Comments: (Plan routine monitors, GA- LMA OK)        Anesthesia Quick Evaluation

## 2012-06-21 NOTE — Transfer of Care (Signed)
Immediate Anesthesia Transfer of Care Note  [name]  Procedure(s) Performed: Procedure(s) (LRB): EXAM UNDER ANESTHESIA (N/A) BIOPSY RECTAL (N/A)  Patient Location: PACU  Anesthesia Type: General  Level of Consciousness: awake, alert , oriented and patient cooperative  Airway & Oxygen Therapy: Patient Spontanous Breathing and Patient connected to face mask oxygen  Post-op Assessment: Report given to PACU RN and Post -op Vital signs reviewed and stable  Post vital signs: Reviewed and stable  Complications: No apparent anesthesia complications

## 2012-06-21 NOTE — Anesthesia Procedure Notes (Signed)
Procedure Name: LMA Insertion Date/Time: 06/21/2012 10:16 AM Performed by: Verlan Friends Pre-anesthesia Checklist: Patient identified, Emergency Drugs available, Suction available, Patient being monitored and Timeout performed Patient Re-evaluated:Patient Re-evaluated prior to inductionOxygen Delivery Method: Circle System Utilized Preoxygenation: Pre-oxygenation with 100% oxygen Intubation Type: IV induction Ventilation: Mask ventilation without difficulty LMA: LMA inserted LMA Size: 3.0 Number of attempts: 1 Airway Equipment and Method: bite block Placement Confirmation: positive ETCO2 Tube secured with: Tape Dental Injury: Teeth and Oropharynx as per pre-operative assessment  Comments: Patient is edentulous.   Dentured in cup and will follow patient to PACU.

## 2012-06-21 NOTE — Interval H&P Note (Signed)
History and Physical Interval Note:  06/21/2012 10:01 AM  [name]  has presented today for surgery, with the diagnosis of cancer anus  The goals of and the various methods of treatment have been discussed with the patient and family. After consideration of risks, benefits and other options for treatment, the patient has consented to  Procedure(s) (LRB): EXAM UNDER ANESTHESIA (N/A) BIOPSY RECTAL (N/A) as a surgical intervention .  The patient's history has been reviewed, patient examined, no change in status, stable for surgery.  I have reviewed the patient's chart and labs.  Questions were answered to the patient's satisfaction.     [name]

## 2012-06-23 ENCOUNTER — Telehealth (INDEPENDENT_AMBULATORY_CARE_PROVIDER_SITE_OTHER): Payer: Self-pay | Admitting: General Surgery

## 2012-06-23 NOTE — Telephone Encounter (Signed)
Called patient home number and left message for call to be returned. Called her cell phone and the number is no longer in service. Called her work number and she has already left for the day.  Patient to be advised of pathology per Dr. Derrell Lolling: "Inform patient of Pathology report,.call the patient and tell her that all of the biopsies are negative for cancer. Tell her that is, of course good news."

## 2012-06-23 NOTE — Progress Notes (Signed)
Quick Note:  Inform patient of Pathology report,.call the patient and tell her that all of the biopsies are negative for cancer. Tell her that is, of course good news. ______

## 2012-06-23 NOTE — Telephone Encounter (Signed)
Patient returned call was advised of pathology per Dr. Jacinto Halim request.

## 2012-06-27 ENCOUNTER — Encounter (HOSPITAL_BASED_OUTPATIENT_CLINIC_OR_DEPARTMENT_OTHER): Payer: Self-pay | Admitting: General Surgery

## 2012-07-07 ENCOUNTER — Encounter (INDEPENDENT_AMBULATORY_CARE_PROVIDER_SITE_OTHER): Payer: Self-pay | Admitting: General Surgery

## 2012-07-07 ENCOUNTER — Ambulatory Visit (INDEPENDENT_AMBULATORY_CARE_PROVIDER_SITE_OTHER): Payer: BC Managed Care – PPO | Admitting: General Surgery

## 2012-07-07 VITALS — BP 124/68 | HR 70 | Temp 97.8°F | Resp 14 | Ht 67.0 in | Wt 143.2 lb

## 2012-07-07 DIAGNOSIS — C211 Malignant neoplasm of anal canal: Secondary | ICD-10-CM

## 2012-07-07 NOTE — Patient Instructions (Signed)
All of your biopsies are negative for cancer which is wonderful news. You had been given a copy of the pathology report.  All of the anal and rectal scars are healing. It will take another 3-6 weeks for them to completely heal and soften up. Nevertheless they are healing normally.  Keep your appointment with Dr. Truett Perna  Return to see Dr. Derrell Lolling in one year.

## 2012-07-07 NOTE — Progress Notes (Signed)
Patient ID: [name], female   DOB: July 29, 1946, 66 y.o.   MRN: [id number]  History: This patient underwent examination under anesthesia, anal dilatation, removal of a small hemorrhoid, biopsy of the posterior vaginal wall, and biopsy of anterior anal canal wall.  Date of surgery  August 21. All the biopsies are benign. She feels better. Her stools are coming easier. She thinks the anal dilatation helped. No blood. No pain.  Exam: Perianal skin is reasonably healthy without infection. Perianal tissues were little edematous and there are couple of was still slightly open but no bleeding no infection no tenderness. Good result.  Assessment: Anal stenosis. Symptomatically improved following the above described surgery History primary chemotherapy and radiation therapy for squamous cell cancer of the rectovaginal septum. No evidence of recurrent disease at this time.  Plan: Keep regular appointment with Dr. Jillyn Hidden B. Sherrill Return to see Dr. Derrell Lolling one year.   Angelia Mould. Derrell Lolling, M.D., [facility] General and Minimally invasive Surgery Breast and Colorectal Surgery Office:   [phone] Pager:   [phone]

## 2012-08-18 ENCOUNTER — Ambulatory Visit (HOSPITAL_BASED_OUTPATIENT_CLINIC_OR_DEPARTMENT_OTHER): Payer: BC Managed Care – PPO | Admitting: Nurse Practitioner

## 2012-08-18 ENCOUNTER — Telehealth: Payer: Self-pay | Admitting: Oncology

## 2012-08-18 VITALS — BP 122/62 | HR 77 | Temp 97.7°F | Resp 20 | Ht 67.0 in | Wt 145.2 lb

## 2012-08-18 DIAGNOSIS — L989 Disorder of the skin and subcutaneous tissue, unspecified: Secondary | ICD-10-CM

## 2012-08-18 DIAGNOSIS — F329 Major depressive disorder, single episode, unspecified: Secondary | ICD-10-CM

## 2012-08-18 DIAGNOSIS — C211 Malignant neoplasm of anal canal: Secondary | ICD-10-CM

## 2012-08-18 NOTE — Progress Notes (Signed)
OFFICE PROGRESS NOTE  Interval history:  [name] returns as scheduled. She underwent an examination under anesthesia with dilatation of the anal canal, biopsy the posterior vaginal wall, excision of an external hemorrhoid and biopsy anterior wall anal canal by Dr. Derrell Lolling on 06/21/2012. The biopsies were negative for evidence of malignancy.  Since undergoing the dilatation procedure she notes no pain with bowel movements. She continues to occasionally note blood on the toilet paper after a bowel movement. She attributes the blood to hemorrhoids. She has a good appetite. She is gaining weight. She has returned to work. Since beginning a new antidepressant she has noted nausea and "weakness in the knees" when she wakes up in the mornings. The nausea and weakness typically resolve by lunchtime.  For the past several months she has noticed an itchy lesion at the left upper back.   Objective: Temperature 97.7 heart rate 77 respirations 20 blood pressure 122/62.  Oropharynx is without thrush or ulceration. No palpable cervical, supraclavicular, axillary or inguinal lymph nodes. Lungs are clear. Regular cardiac rhythm. Abdomen is soft and nontender. No organomegaly. Extremities are without edema. She has external hemorrhoids. No nodularity at the anal verge. At the left upper back there is an approximate half centimeter slightly raised papule with question early central ulceration.  Lab Results: Lab Results  Component Value Date   WBC 3.0* 04/25/2012   HGB 13.1 04/25/2012   HCT 38.6 04/25/2012   MCV 98.3 04/25/2012   PLT 181 04/25/2012    Chemistry:    Chemistry      Component Value Date/Time   NA 138 02/09/2012 1222   K 4.0 02/09/2012 1222   CL 104 02/09/2012 1222   CO2 27 02/09/2012 1222   BUN 15 02/09/2012 1222   CREATININE 0.60 02/09/2012 1222      Component Value Date/Time   CALCIUM 9.2 02/09/2012 1222   ALKPHOS 84 01/25/2012 1512   AST 21 01/25/2012 1512   ALT 13 01/25/2012 1512   BILITOT  0.3 01/25/2012 1512       Studies/Results: No results found.  Medications: I have reviewed the patient's current medications.  Assessment/Plan:  1. Poorly differentiated carcinoma, basaloid squamous cell carcinoma, of the anal canal and posterior vagina. Staging CT scans of the abdomen and pelvis on 11/15/2011 with suspicious perirectal lymph node and no evidence of distant metastatic disease. She began radiation 11/29/2011. She completed cycle 1 mitomycin and 5 fluorouracil 11/29/2011. She completed cycle 2 beginning 12/27/2011. She completed the course of radiation on 01/05/2012. 2. Rectal bleeding secondary to #1, improved. 3. Erythema and desquamation related to radiation. Resolved 4. Anemia. Resolved 5. Status post placement right upper the PICC line 11/29/2011. The PICC line has been removed. 6. Depression.  7. Recent urinary tract infection 8. Status post evaluation in the emergency Department on 02/09/2012 for shortness of breath. Chest CT was negative for evidence of a pulmonary embolism or other acute finding. She has had no further shortness of breath. 9. Rectal stenosis. She is status post an exam under anesthesia with dilatation of the anal canal and biopsies of the posterior vaginal wall and anterior anal wall canal with pathology negative for evidence of malignancy 06/21/2012. 10. Pruritic skin lesion at the left upper back in sun exposed area. We are making a referral to [facility] dermatology.  Disposition-she remains in clinical remission from the anal cancer. She will return for a followup visit in 4 months. She will contact the office the interim with any problems.  [name],  [name] ANP/GNP-BC

## 2012-08-18 NOTE — Telephone Encounter (Signed)
appts made and printed for pt aom °

## 2012-08-23 ENCOUNTER — Telehealth: Payer: Self-pay | Admitting: Oncology

## 2012-08-23 NOTE — Telephone Encounter (Signed)
Talked to patient gave her appt with Dr. Terri Piedra , Dermatologu on 11/127/13 0940 am , gave referral to HIM

## 2012-08-25 ENCOUNTER — Ambulatory Visit: Payer: BC Managed Care – PPO | Admitting: Nurse Practitioner

## 2012-11-20 ENCOUNTER — Encounter: Payer: Self-pay | Admitting: Radiation Oncology

## 2012-11-20 DIAGNOSIS — K625 Hemorrhage of anus and rectum: Secondary | ICD-10-CM | POA: Insufficient documentation

## 2012-11-20 DIAGNOSIS — F329 Major depressive disorder, single episode, unspecified: Secondary | ICD-10-CM | POA: Insufficient documentation

## 2012-11-20 DIAGNOSIS — F419 Anxiety disorder, unspecified: Secondary | ICD-10-CM | POA: Insufficient documentation

## 2012-11-20 DIAGNOSIS — Z923 Personal history of irradiation: Secondary | ICD-10-CM | POA: Insufficient documentation

## 2012-11-20 DIAGNOSIS — F32A Depression, unspecified: Secondary | ICD-10-CM | POA: Insufficient documentation

## 2012-11-21 ENCOUNTER — Telehealth: Payer: Self-pay | Admitting: Oncology

## 2012-11-21 ENCOUNTER — Ambulatory Visit (HOSPITAL_BASED_OUTPATIENT_CLINIC_OR_DEPARTMENT_OTHER): Payer: BC Managed Care – PPO | Admitting: Oncology

## 2012-11-21 ENCOUNTER — Ambulatory Visit
Admission: RE | Admit: 2012-11-21 | Discharge: 2012-11-21 | Disposition: A | Discharge status: Home / Self Care | Payer: BC Managed Care – PPO | Source: Ambulatory Visit | Attending: Radiation Oncology | Admitting: Radiation Oncology

## 2012-11-21 VITALS — BP 131/46 | HR 69 | Temp 97.9°F | Resp 20 | Ht 67.0 in | Wt 153.4 lb

## 2012-11-21 VITALS — BP 133/66 | HR 66 | Temp 98.0°F | Wt 154.0 lb

## 2012-11-21 DIAGNOSIS — F329 Major depressive disorder, single episode, unspecified: Secondary | ICD-10-CM

## 2012-11-21 DIAGNOSIS — C211 Malignant neoplasm of anal canal: Secondary | ICD-10-CM

## 2012-11-21 DIAGNOSIS — K625 Hemorrhage of anus and rectum: Secondary | ICD-10-CM

## 2012-11-21 NOTE — Progress Notes (Signed)
Patient here for routine follow up completion of anal cancer radiation on 01/05/12.Doing well.Denies pain or any rectal problems.Takes stool softeners daily.

## 2012-11-21 NOTE — Progress Notes (Signed)
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 She is generally doing well. She underwent anoscopy with biopsies back in August with Dr. Derrell Lolling and biopsies were benign. She takes stool softeners twice a day for anal stenosis and hemorrhoids. She does have small bowel movements, up to 3 each morning but does well the rest of the day.   She sees Dr. Derrell Lolling once a year. She sees Dr. Truett Perna later today. No GU difficulties.  The physical examination: Alert and oriented. Wt Readings from Last 3 Encounters:  11/21/12 154 lb (69.854 kg)  08/18/12 145 lb 3.2 oz (65.862 kg)  07/07/12 143 lb 4 oz (64.978 kg)   Temp Readings from Last 3 Encounters:  11/21/12 98 F (36.7 C)   08/18/12 97.7 F (36.5 C) Oral  07/07/12 97.8 F (36.6 C) Temporal   BP Readings from Last 3 Encounters:  11/21/12 133/66  08/18/12 122/62  07/07/12 124/68   Pulse Readings from Last 3 Encounters:  11/21/12 66  08/18/12 77  07/07/12 70   Head and neck examination: Grossly unremarkable. Nodes: Without palpable cervical, supraclavicular, or inguinal lymphadenopathy. Abdomen: Without masses organomegaly. Rectal: There is an external hemorrhoid tag. She does have anal stenosis which does admit my pinky finger. No masses or lesions are appreciated. Extremities: Without edema.  Laboratory data: Lab Results  Component Value Date   WBC 3.0* 04/25/2012   HGB 13.1 04/25/2012   HCT 38.6 04/25/2012   MCV 98.3 04/25/2012   PLT 181 04/25/2012   Impression: Complete response to chemoradiation. I'm pleased with her progress. Chest is that she will have to deal with chronic anal stenosis which may require frequent dilatations by Dr. Derrell Lolling. Followup appointment here in 6 months.  Plan: Followup with me in 6 months, along with routine followup with Dr. Truett Perna and annual followup with Dr. Derrell Lolling.

## 2012-11-21 NOTE — Progress Notes (Signed)
   [name]    OFFICE PROGRESS NOTE   INTERVAL HISTORY:   She returns as scheduled. She feels well and continues to work 6 days per week. She has been bowel movements. No other complaint. She saw Dr. Dayton Scrape earlier today.  Objective:  Vital signs in last 24 hours:  Blood pressure 131/46, pulse 69, temperature 97.9 F (36.6 C), temperature source Oral, resp. rate 20, height 5\' 7"  (1.702 m), weight 153 lb 6.4 oz (69.582 kg).    HEENT: Neck without mass Lymphatics: No cervical, supraclavicular, axillary, or inguinal nodes Resp: end inspiratory coarse rhonchi at the right posterior base, no respiratory distress Cardio: Regular rate and rhythm GI: No hepatomegaly, nontender, rectal exam deferred as she had an examination with Dr. Dayton Scrape earlier today Vascular: No leg edema    Medications: I have reviewed the patient's current medications.  Assessment/Plan: 1. Poorly differentiated carcinoma, basaloid squamous cell carcinoma, of the anal canal and posterior vagina. Staging CT scans of the abdomen and pelvis on 11/15/2011 with suspicious perirectal lymph node and no evidence of distant metastatic disease. She began radiation 11/29/2011. She completed cycle 1 mitomycin and 5 fluorouracil 11/29/2011. She completed cycle 2 beginning 12/27/2011. She completed the course of radiation on 01/05/2012. 2. Rectal bleeding secondary to #1, improved. 3. Erythema and desquamation related to radiation. Resolved 4. Anemia. Resolved, she will discontinue iron 5. Status post placement right upper the PICC line 11/29/2011. The PICC line has been removed. 6. Depression.  7. Recent urinary tract infection 8. Status post evaluation in the emergency Department on 02/09/2012 for shortness of breath. Chest CT was negative for evidence of a pulmonary embolism or other acute finding. She has had no further shortness of breath. 9. Rectal stenosis. She is status post an exam under anesthesia  with dilatation of the anal canal and biopsies of the posterior vaginal wall and anterior anal wall canal with pathology negative for evidence of malignancy 06/21/2012.   Disposition:  She remains in clinical remission from anal cancer. She will return for an office visit in 6 months.   Thornton Papas, MD  11/21/2012  11:44 AM

## 2012-11-21 NOTE — Telephone Encounter (Signed)
Talked to patient and gave her appt for 05/21/13,ML only

## 2012-12-19 ENCOUNTER — Ambulatory Visit: Payer: BC Managed Care – PPO | Admitting: Oncology

## 2013-05-21 ENCOUNTER — Ambulatory Visit: Payer: BC Managed Care – PPO | Admitting: Nurse Practitioner

## 2013-05-22 ENCOUNTER — Ambulatory Visit (HOSPITAL_BASED_OUTPATIENT_CLINIC_OR_DEPARTMENT_OTHER): Payer: BC Managed Care – PPO | Admitting: Nurse Practitioner

## 2013-05-22 ENCOUNTER — Ambulatory Visit: Payer: BC Managed Care – PPO | Admitting: Radiation Oncology

## 2013-05-22 ENCOUNTER — Telehealth: Payer: Self-pay | Admitting: Oncology

## 2013-05-22 VITALS — BP 135/71 | HR 69 | Temp 98.2°F | Resp 18 | Ht 67.0 in | Wt 152.6 lb

## 2013-05-22 DIAGNOSIS — C801 Malignant (primary) neoplasm, unspecified: Secondary | ICD-10-CM

## 2013-05-22 NOTE — Telephone Encounter (Signed)
gv and printed appt sched and avs for pt  °

## 2013-05-22 NOTE — Telephone Encounter (Signed)
pt called to r/s est..Marland KitchenMarland KitchenDone

## 2013-05-22 NOTE — Progress Notes (Signed)
OFFICE PROGRESS NOTE  Interval history:  [name] returns as scheduled. She feels well. She remains very active. She works 6 days a week. She has a good appetite. She has noted some constipation since beginning a new antidepressant. She is taking a stool softener. No rectal pain or bleeding.   Objective: Blood pressure 135/71, pulse 69, temperature 98.2 F (36.8 C), temperature source Oral, resp. rate 18, height 5\' 7"  (1.702 m), weight 152 lb 9.6 oz (69.219 kg).  Oropharynx is without thrush or ulceration. No palpable cervical, supraclavicular, axillary or inguinal lymph nodes. Lungs are clear. Regular cardiac rhythm. Abdomen is soft and nontender. No hepatomegaly. Somewhat firm probable hemorrhoids inferior left anal verge. Anal/rectal stenosis.  Lab Results: Lab Results  Component Value Date   WBC 3.0* 04/25/2012   HGB 13.1 04/25/2012   HCT 38.6 04/25/2012   MCV 98.3 04/25/2012   PLT 181 04/25/2012    Chemistry:    Chemistry      Component Value Date/Time   NA 138 02/09/2012 1222   K 4.0 02/09/2012 1222   CL 104 02/09/2012 1222   CO2 27 02/09/2012 1222   BUN 15 02/09/2012 1222   CREATININE 0.60 02/09/2012 1222      Component Value Date/Time   CALCIUM 9.2 02/09/2012 1222   ALKPHOS 84 01/25/2012 1512   AST 21 01/25/2012 1512   ALT 13 01/25/2012 1512   BILITOT 0.3 01/25/2012 1512       Studies/Results: No results found.  Medications: I have reviewed the patient's current medications.  Assessment/Plan:  1. Poorly differentiated carcinoma, basaloid squamous cell carcinoma, of the anal canal and posterior vagina. Staging CT scans of the abdomen and pelvis on 11/15/2011 with suspicious perirectal lymph node and no evidence of distant metastatic disease. She began radiation 11/29/2011. She completed cycle 1 mitomycin and 5 fluorouracil 11/29/2011. She completed cycle 2 beginning 12/27/2011. She completed the course of radiation on 01/05/2012. 2. Rectal bleeding secondary to #1.  Resolved. 3. Erythema and desquamation related to radiation. Resolved. 4. Anemia. Resolved. 5. Status post placement right upper the PICC line 11/29/2011. The PICC line has been removed. 6. Depression.  7. Status post evaluation in the emergency Department on 02/09/2012 for shortness of breath. Chest CT was negative for evidence of a pulmonary embolism or other acute finding. She has had no further shortness of breath. 8. Rectal stenosis. She is status post an exam under anesthesia with dilatation of the anal canal and biopsies of the posterior vaginal wall and anterior anal wall canal with pathology negative for evidence of malignancy 06/21/2012. 9. Probable hemorrhoids inferior left anal verge.  Disposition-Ms. Previti appears stable. She remains in clinical remission from anal cancer. On exam she is noted to have probable hemorrhoids, though firmer than typical. We will have her return in 3 months for a followup exam. She will contact the office prior to that visit if she notes any changes.  Patient seen with Dr. Truett Perna.  Lonna Cobb ANP/GNP-BC

## 2013-05-29 ENCOUNTER — Ambulatory Visit
Admission: RE | Admit: 2013-05-29 | Discharge: 2013-05-29 | Disposition: A | Discharge status: Home / Self Care | Payer: BC Managed Care – PPO | Source: Ambulatory Visit | Attending: Radiation Oncology | Admitting: Radiation Oncology

## 2013-05-29 VITALS — BP 151/77 | HR 71 | Temp 97.7°F | Ht 67.0 in | Wt 154.4 lb

## 2013-05-29 DIAGNOSIS — C211 Malignant neoplasm of anal canal: Secondary | ICD-10-CM

## 2013-05-29 NOTE — Progress Notes (Signed)
CC: Dr. 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 She is generally doing well and is without complaints today. She tells me that she is having financial problems and has not been able to keep her followup visits with Dr. Derrell Lolling. She takes a stool softener daily for her anal stenosis. She denies rectal bleeding. No urinary difficulty.  Physical examination: Alert and oriented. Wt Readings from Last 3 Encounters:  05/29/13 154 lb 6.4 oz (70.035 kg)  05/22/13 152 lb 9.6 oz (69.219 kg)  11/21/12 153 lb 6.4 oz (69.582 kg)   Temp Readings from Last 3 Encounters:  05/29/13 97.7 F (36.5 C)   05/22/13 98.2 F (36.8 C) Oral  11/21/12 97.9 F (36.6 C) Oral   BP Readings from Last 3 Encounters:  05/29/13 151/77  05/22/13 135/71  11/21/12 131/46   Pulse Readings from Last 3 Encounters:  05/29/13 71  05/22/13 69  11/21/12 69   Head and neck examination: Grossly unremarkable. Nodes: Without palpable cervical, supraclavicular, or inguinal/femoral lymphadenopathy. Abdomen: Soft, and nontender. No masses are appreciated. Pelvic examination: She does have a distal anal stricture which does admit my index finger. She has external hemorrhoids. No palpable evidence for recurrent disease.  Impression: Satisfactory progress with no evidence for recurrent disease. I suspect that she will be some dilatation of her distal anal stricture at some point in time. Because of financial reasons, she wants to wait until next year before she sees Dr. Derrell Lolling again. She'll see Dr. Truett Perna for a followup visit in approximately 3 months.  Plan: She will see Dr. Truett Perna for a followup visit in 3 months I'll see her back for a followup visit in 5 months. She should see Dr. Derrell Lolling no later than next spring.

## 2013-05-29 NOTE — Progress Notes (Signed)
Brien Mates here for follow up after treatment to her anal canal.  She denies pain, diarrhea, blood in her stool, bladder issues and fatigue.  She does have constipation and takes colace which helps.  She also has a hemorrhoid that Dr. Truett Perna is aware of from her appt with him last week.  She stopped taking her Seroquel yesterday and will start taking another medication to help her sleep.  She cannot remember the name of it.

## 2013-08-23 ENCOUNTER — Ambulatory Visit: Payer: BC Managed Care – PPO | Admitting: Oncology

## 2013-09-11 ENCOUNTER — Encounter (INDEPENDENT_AMBULATORY_CARE_PROVIDER_SITE_OTHER): Payer: Self-pay

## 2013-09-11 ENCOUNTER — Ambulatory Visit (HOSPITAL_BASED_OUTPATIENT_CLINIC_OR_DEPARTMENT_OTHER): Payer: BC Managed Care – PPO | Admitting: Oncology

## 2013-09-11 VITALS — BP 149/72 | HR 70 | Temp 98.1°F | Resp 18 | Ht 67.0 in | Wt 155.6 lb

## 2013-09-11 DIAGNOSIS — F329 Major depressive disorder, single episode, unspecified: Secondary | ICD-10-CM

## 2013-09-11 DIAGNOSIS — K625 Hemorrhage of anus and rectum: Secondary | ICD-10-CM

## 2013-09-11 DIAGNOSIS — C52 Malignant neoplasm of vagina: Secondary | ICD-10-CM

## 2013-09-11 DIAGNOSIS — C211 Malignant neoplasm of anal canal: Secondary | ICD-10-CM

## 2013-09-11 NOTE — Progress Notes (Signed)
   [facility]    OFFICE PROGRESS NOTE   INTERVAL HISTORY:   She returns for scheduled followup of anal cancer. She feels well. The external hemorrhoid bleeds occasionally. No difficulty with bowel function. No other complaint.  Objective:  Vital signs in last 24 hours:  Blood pressure 149/72, pulse 70, temperature 98.1 F (36.7 C), temperature source Oral, resp. rate 18, height 5\' 7"  (1.702 m), weight 155 lb 9.6 oz (70.58 kg), SpO2 98.00%.    HEENT: Neck without mass Lymphatics: No cervical, supra-clavicular, axillary, or inguinal nodes Resp: Lungs clear bilaterally Cardio: Regular rate and rhythm GI: No hepatomegaly, nontender, no mass Vascular: No leg edema Rectal: There is a hemorrhoid at the inferior anal verge. Several smaller hemorrhoids superiorly. There is a rectal stricture proximal to the anal verge. I cannot pass a stricture with either my index or fifth finger. No palpable mass.     Medications: I have reviewed the patient's current medications.  Assessment/Plan: 1. Poorly differentiated carcinoma, basaloid squamous cell carcinoma, of the anal canal and posterior vagina. Staging CT scans of the abdomen and pelvis on 11/15/2011 with suspicious perirectal lymph node and no evidence of distant metastatic disease. She began radiation 11/29/2011. She completed cycle 1 mitomycin and 5 fluorouracil 11/29/2011. She completed cycle 2 beginning 12/27/2011. She completed the course of radiation on 01/05/2012. 2. Rectal bleeding secondary to #1. Resolved. 3. Erythema and desquamation related to radiation. Resolved. 4. Anemia. Resolved. 5. Status post placement right upper the PICC line 11/29/2011. The PICC line has been removed. 6. Depression.  7. Status post evaluation in the emergency Department on 02/09/2012 for shortness of breath. Chest CT was negative for evidence of a pulmonary embolism or other acute finding. She has had no further shortness of  breath. 8. Rectal stenosis. She is status post an exam under anesthesia with dilatation of the anal canal and biopsies of the posterior vaginal wall and anterior anal wall canal with pathology negative for evidence of malignancy 06/21/2012. Persistent rectal stricture on exam today. 9.  hemorrhoid at the inferior left anal verge.  Disposition:  She remains in clinical remission from anal cancer. She is scheduled to see Dr. Dayton Scrape next month. We will see her in approximately 4 months. She declined a referral to Dr. Derrell Lolling to consider a repeat dilation of the rectum. We offered [facility].   Thornton Papas, MD  09/11/2013  3:54 PM

## 2013-09-12 ENCOUNTER — Telehealth: Payer: Self-pay | Admitting: *Deleted

## 2013-09-12 NOTE — Telephone Encounter (Signed)
Lm gv appt for 01/17/14 @ 3:15pm. Made pt aware that i will mail a letter/avs...td

## 2013-09-13 ENCOUNTER — Telehealth: Payer: Self-pay | Admitting: *Deleted

## 2013-09-13 NOTE — Telephone Encounter (Signed)
Pt called stating to move her appt from 01/17/14 to 02/06/14. gv appt for 02/06/14 @ 9:15am...td

## 2013-10-18 ENCOUNTER — Encounter: Payer: Self-pay | Admitting: *Deleted

## 2013-10-23 ENCOUNTER — Encounter: Payer: Self-pay | Admitting: Radiation Oncology

## 2013-10-23 ENCOUNTER — Ambulatory Visit
Admission: RE | Admit: 2013-10-23 | Discharge: 2013-10-23 | Disposition: A | Discharge status: Home / Self Care | Payer: BC Managed Care – PPO | Source: Ambulatory Visit | Attending: Radiation Oncology | Admitting: Radiation Oncology

## 2013-10-23 VITALS — BP 147/82 | HR 67 | Temp 97.7°F | Resp 20 | Wt 157.8 lb

## 2013-10-23 DIAGNOSIS — C211 Malignant neoplasm of anal canal: Secondary | ICD-10-CM

## 2013-10-23 NOTE — Progress Notes (Signed)
CC: Dr. 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 She is without new complaints today. She continues with a stool softener for her anal stenosis. She continues to have financial difficulties and has been unable to keep her followup visits with Dr. Derrell Lolling. 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 She saw Dr. Truett Perna on November 11 and she will see Lonna Cobb PA in early April 2015. No GU or GI difficulties.  Physical examination: Wt Readings from Last 3 Encounters:  10/23/13 157 lb 12.8 oz (71.578 kg)  09/11/13 155 lb 9.6 oz (70.58 kg)  05/29/13 154 lb 6.4 oz (70.035 kg)   Temp Readings from Last 3 Encounters:  10/23/13 97.7 F (36.5 C) Oral  09/11/13 98.1 F (36.7 C) Oral  05/29/13 97.7 F (36.5 C)    BP Readings from Last 3 Encounters:  10/23/13 147/82  09/11/13 149/72  05/29/13 151/77   Pulse Readings from Last 3 Encounters:  10/23/13 67  09/11/13 70  05/29/13 71   Head and neck examination: Grossly unremarkable. Nodes: Without palpable cervical, supraclavicular, inguinal or femoral lymphadenopathy. Abdomen: Soft without masses organomegaly. Pelvic: Her distal anal canal stricture is essentially unchanged. I am able to admit my index finger with a fair amount of discomfort.. She continues to have external hemorrhoids. No palpable evidence for recurrent disease.  Laboratory data: Lab Results  Component Value Date   WBC 3.0* 04/25/2012   HGB 13.1 04/25/2012   HCT 38.6 04/25/2012   MCV 98.3 04/25/2012   PLT 181 04/25/2012   Impression: Satisfactory progress with no evidence for recurrent disease. She continues to have an anal canal stricture which would probably need a dilatation later in 2015. She would like to put this off for the time being because of financial concerns. She is let us  know if she has any difficulty moving her bowels.  Plan: Followup visit with Lonna Cobb PA in April, and followup visit with me in approximately 6 months.

## 2013-10-23 NOTE — Progress Notes (Signed)
Pt denies pain, fatigue, loss of appetite, bowel issues with the exception of an external hemorrhoid. She takes Colace regularly. She continues to work full time at [employer], part time at [employer]. She states she has no FU w/Dr Derrell Lolling but plans to schedule an appointment for next spring.

## 2014-01-17 ENCOUNTER — Ambulatory Visit: Payer: BC Managed Care – PPO | Admitting: Nurse Practitioner

## 2014-02-06 ENCOUNTER — Ambulatory Visit: Payer: BC Managed Care – PPO | Admitting: Nurse Practitioner

## 2014-02-06 ENCOUNTER — Telehealth: Payer: Self-pay | Admitting: Oncology

## 2014-02-06 NOTE — Telephone Encounter (Signed)
Pt came by and  r/s appt to july 2015

## 2014-02-11 ENCOUNTER — Ambulatory Visit: Payer: BC Managed Care – PPO | Admitting: Nurse Practitioner

## 2014-02-22 ENCOUNTER — Ambulatory Visit: Payer: BC Managed Care – PPO | Admitting: Oncology

## 2014-05-13 ENCOUNTER — Telehealth: Payer: Self-pay | Admitting: Radiation Oncology

## 2014-05-13 NOTE — Telephone Encounter (Signed)
Clld pt to remind her of her appt w/Dr. Valere Dross tomorrow. She was agreeable. (lw)

## 2014-05-14 ENCOUNTER — Ambulatory Visit
Admission: RE | Admit: 2014-05-14 | Discharge: 2014-05-14 | Disposition: A | Discharge status: Home / Self Care | Payer: BC Managed Care – PPO | Source: Ambulatory Visit | Attending: Radiation Oncology | Admitting: Radiation Oncology

## 2014-05-14 ENCOUNTER — Encounter: Payer: Self-pay | Admitting: Radiation Oncology

## 2014-05-14 VITALS — BP 150/80 | HR 86 | Temp 98.3°F | Resp 20 | Wt 165.0 lb

## 2014-05-14 DIAGNOSIS — C211 Malignant neoplasm of anal canal: Secondary | ICD-10-CM

## 2014-05-14 NOTE — Progress Notes (Signed)
CC: Dr. [name],  Dr. 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 She was last seen here approximately 6 months ago. She is scheduled to see Ned Card NP again later this month. She is doing well from a GU and GI standpoint. She takes a fiber stool softener because of her anal stenosis. She continues to work in a [employer], and also part time at [employer] on the weekends. She's had some weight gain because of starting an antidepressant. She has not seen Dr. Dalbert Batman in the recent past.  Physical examination: Alert and oriented. Filed Vitals:   05/14/14 1551  BP: 150/80  Pulse: 86  Temp: 98.3 F (36.8 C)  Resp: 20   Nodes: There is no palpable cervical, supraclavicular, or inguinal/femoral adenopathy. Abdomen: Soft without masses organomegaly. The pelvic examination: Bimanual and rectal exams remarkable for marked anal stenosis. I am only able to admit my fifth finger which is uncomfortable. There is no obvious disease recurrence.  Impression: No evidence for recurrent disease. However, she has worsening anal stenosis. She needs repeat anal dilatation.  Plan: I instructed her to see Dr. Dalbert Batman in the future for a followup visit and scheduling of anal dilatation. Followup visit here in approximately 6 months. She'll see Ned Card NP on July 28.

## 2014-05-14 NOTE — Progress Notes (Signed)
Patient denies pain, bowel issues, nausea, loss of appetite, fatigue. She has one external hemorrhoid. She takes Benefiber daily.

## 2014-05-21 ENCOUNTER — Ambulatory Visit: Payer: Self-pay | Admitting: Radiation Oncology

## 2014-05-28 ENCOUNTER — Telehealth: Payer: Self-pay | Admitting: Oncology

## 2014-05-28 ENCOUNTER — Ambulatory Visit (HOSPITAL_BASED_OUTPATIENT_CLINIC_OR_DEPARTMENT_OTHER): Payer: BC Managed Care – PPO | Admitting: Nurse Practitioner

## 2014-05-28 ENCOUNTER — Ambulatory Visit: Payer: BC Managed Care – PPO | Admitting: Radiation Oncology

## 2014-05-28 VITALS — BP 147/82 | HR 79 | Temp 98.7°F | Resp 19 | Ht 67.0 in | Wt 171.0 lb

## 2014-05-28 DIAGNOSIS — K649 Unspecified hemorrhoids: Secondary | ICD-10-CM

## 2014-05-28 DIAGNOSIS — C211 Malignant neoplasm of anal canal: Secondary | ICD-10-CM

## 2014-05-28 DIAGNOSIS — K624 Stenosis of anus and rectum: Secondary | ICD-10-CM

## 2014-05-28 DIAGNOSIS — C801 Malignant (primary) neoplasm, unspecified: Secondary | ICD-10-CM

## 2014-05-28 NOTE — Telephone Encounter (Signed)
Pt confirmed office visit per 07/28 POF, gave pt AVS..Marland KitchenKJ

## 2014-05-28 NOTE — Progress Notes (Signed)
  [name] OFFICE PROGRESS NOTE   Diagnosis:  Anal cancer.  INTERVAL HISTORY:   [name] returns for followup of anal cancer. She feels well. She has a good appetite and good energy level. She is gaining weight. She attributes the weight gain to an antidepressant. Bowels moving regularly with a stool softener and fiber. No pain with bowel movements. She has occasional rectal bleeding which she relates to an external hemorrhoid. No shortness of breath or cough. No abdominal pain.  Objective:  Vital signs in last 24 hours:  Blood pressure 147/82, pulse 79, temperature 98.7 F (37.1 C), temperature source Oral, resp. rate 19, height 5\' 7"  (1.702 m), weight 171 lb (77.565 kg).    HEENT: No thrush or ulcerations. Lymphatics: No palpable cervical, supraclavicular, axillary or inguinal lymph nodes. Resp: Lungs clear. Cardio: Regular cardiac rhythm. GI: Abdomen soft and nontender. No mass. No hepatomegaly. Vascular: No leg edema. Rectal: External hemorrhoids. Largest at the inferior left anal verge. Anal stenosis. I was able to pass with my fifth finger. No palpable mass.   Lab Results:  Lab Results  Component Value Date   WBC 3.0* 04/25/2012   HGB 13.1 04/25/2012   HCT 38.6 04/25/2012   MCV 98.3 04/25/2012   PLT 181 04/25/2012   NEUTROABS 1.7 04/25/2012    Imaging:  No results found.  Medications: I have reviewed the patient's current medications.  Assessment/Plan: 1. Poorly differentiated carcinoma, basaloid squamous cell carcinoma, of the anal canal and posterior vagina. Staging CT scans of the abdomen and pelvis on 11/15/2011 with suspicious perirectal lymph node and no evidence of distant metastatic disease. She began radiation 11/29/2011. She completed cycle 1 mitomycin and 5 fluorouracil 11/29/2011. She completed cycle 2 beginning 12/27/2011. She completed the course of radiation on 01/05/2012. 2. Rectal bleeding secondary to #1. Resolved. 3. Erythema and  desquamation related to radiation. Resolved. 4. Anemia. Resolved. 5. Status post placement right upper the PICC line 11/29/2011. The PICC line has been removed. 6. Depression.  7. Status post evaluation in the emergency Department on 02/09/2012 for shortness of breath. Chest CT was negative for evidence of a pulmonary embolism or other acute finding. She has had no further shortness of breath. 8. Rectal stenosis. She is status post an exam under anesthesia with dilatation of the anal canal and biopsies of the posterior vaginal wall and anterior anal wall canal with pathology negative for evidence of malignancy 06/21/2012. Persistent rectal stenosis on exam today. She has been referred to Dr. Dalbert Batman for anal dilatation. 9. Hemorrhoid at the inferior left anal verge.   Disposition: [name] appears stable. She remains in clinical remission from anal cancer. She has an appointment with Dr. Dalbert Batman in August for evaluation of the anal stenosis. She will return for a followup visit here in 8 months. She will contact the office in the interim with any problems.  Plan reviewed with Dr. Benay Spice.    Ned Card ANP/GNP-BC   05/28/2014  9:44 AM

## 2014-06-17 ENCOUNTER — Encounter (INDEPENDENT_AMBULATORY_CARE_PROVIDER_SITE_OTHER): Payer: Self-pay | Admitting: General Surgery

## 2014-06-17 ENCOUNTER — Ambulatory Visit (INDEPENDENT_AMBULATORY_CARE_PROVIDER_SITE_OTHER): Payer: BC Managed Care – PPO | Admitting: General Surgery

## 2014-06-17 VITALS — BP 146/80 | HR 84 | Temp 97.4°F | Ht 67.0 in | Wt 172.5 lb

## 2014-06-17 DIAGNOSIS — K624 Stenosis of anus and rectum: Secondary | ICD-10-CM

## 2014-06-17 DIAGNOSIS — K644 Residual hemorrhoidal skin tags: Secondary | ICD-10-CM

## 2014-06-17 DIAGNOSIS — C211 Malignant neoplasm of anal canal: Secondary | ICD-10-CM

## 2014-06-17 NOTE — Patient Instructions (Signed)
You have a narrowing of your anal canal, called anal stenosis related to previous radiation therapy. We were able to dilate this somewhat in the office today.  Since you are not having a pain, bleeding, or difficulty with bowel movements,  You do not necessarily need to have an operation.  Be sure to dilate yourself daily with lubricating jelly and a glove as you were instructed today.  You also have some external hemorrhoids, but they are not causing any pain or bleeding. Therefore, nothing further needs to be done about them  You are overdue for colonoscopy, and I strongly urge you to set that up with [facility] in the near future  Return to see Dr. Dalbert Batman in one year, sooner if there are any problems

## 2014-06-17 NOTE — Progress Notes (Signed)
Patient ID: [name], female   DOB: [birth year], 68 y.o.   MRN: [id number]  History: This patient was referred back to me by Dr. Julieanne [name] for long-term followup of her squamous cell cancer of the anus and anal canal stenosis.  [name] is her PCP. 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 She had primary radiation therapy and chemotherapy and has no evidence of cancer to date. She apparently has not had a colonoscopy since that time She was returned to the operating room in August 2013 for examination under anesthesia, dilatation of anal canal stenosis and biopsies. All of the biopsies were negative and she did improve. She has no abdominal complaints. She states that she has been gaining weight. She takes Benefiber and stool softeners and has daily bowel movements and the stool  caliber is about as big as her thumb. She has no pain or bleeding. She knows that she needs to have a colonoscopy. Dr. Benay [name] thought  she still had anal stenosis and wanted my evaluation. 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 She is followed by a psychiatrist and takes Abilify which has helped her depression but she thinks caused weight gain.  Past history, family history, social history, and review of systems are documented on the chart, unchanged, and noncontributory except as described above.  Exam: Patient is alert. Friendly. No distress Neck no adenopathy or mass Lungs clear to auscultation bilaterally next heart regular rate and rhythm. No murmur or ectopy Abdomen soft and nontender. No mass. Rectal exam reveals some chronically fibrotic external hemorrhoids, no sign of malignancy, nontender. Chronic radiation changes noted His rectal exam reveals anal canal stenosis. I was  able to slowly dilate this over about 2 minutes and she tolerated that well without bleeding. I feel that a malignant mass.  Assessment: Acquired anal stenosis, status post radiation therapy, asymptomatic. No bleeding, pain, or difficulty with bowel movements. 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 She states that she will do this herself at home daily. I gave her some supplies to get her started. I told her to arrange for colonoscopy with [name], and she said she would go down to their office today to set that up Return to see me in one year Continue followup with Dr. Benay [name] for surveillance    [name], M.D., [facility] General and Minimally invasive Surgery Breast and Colorectal Surgery Office:   [phone] Pager:   [phone]

## 2014-09-02 ENCOUNTER — Encounter (INDEPENDENT_AMBULATORY_CARE_PROVIDER_SITE_OTHER): Payer: Self-pay | Admitting: General Surgery

## 2014-09-13 ENCOUNTER — Other Ambulatory Visit: Payer: Self-pay | Admitting: Gastroenterology

## 2014-10-29 ENCOUNTER — Ambulatory Visit
Admission: RE | Admit: 2014-10-29 | Discharge: 2014-10-29 | Disposition: A | Discharge status: Home / Self Care | Payer: BC Managed Care – PPO | Source: Ambulatory Visit | Attending: Radiation Oncology | Admitting: Radiation Oncology

## 2014-10-29 ENCOUNTER — Encounter: Payer: Self-pay | Admitting: Radiation Oncology

## 2014-10-29 VITALS — BP 136/76 | HR 84 | Temp 98.5°F | Ht 67.0 in | Wt 177.1 lb

## 2014-10-29 DIAGNOSIS — C211 Malignant neoplasm of anal canal: Secondary | ICD-10-CM | POA: Insufficient documentation

## 2014-10-29 DIAGNOSIS — K624 Stenosis of anus and rectum: Secondary | ICD-10-CM | POA: Diagnosis not present

## 2014-10-29 NOTE — Progress Notes (Signed)
[name] here for reassessment s/p radiation therapy for anal canal.  She denies any pain presently and states she has stenosis of the rectum and has been given instructions per her surgeon on dilitation.

## 2014-10-29 NOTE — Addendum Note (Signed)
Encounter addended by: Deirdre Evener, RN on: 10/29/2014  2:07 PM<BR>     Documentation filed: Charges VN

## 2014-10-29 NOTE — Progress Notes (Signed)
CC: Dr. [name], Dr. Fanny Skates, Ned Card, NP  Follow-up note:  The patient returns today approximately 2 years and 10 months following completion of chemoradiation in the management of her T4 N0 squamous cell carcinoma of the anal canal.  I last saw her on 05/14/2014.  I was concerned about her worsening anal stenosis and the need for repeat anal dilatation.  She was seen by Dr. Dalbert Batman on 06/17/2014.  He noted anal stenosis and he was able to slowly dilate this over 2 minutes and she tolerated this without bleeding.  There was no evidence for recurrent disease.  She declined dilatation under anesthesia.  She was instructed to arrange for colonoscopy with [facility] gastroenterology.  She had colonoscopy in October and was given a good report.  She performs self anal dilatations with good results.  She will see medical oncology for a follow-up visit in March 2016.  Physical examination: Alert and oriented. Filed Vitals:   10/29/14 0950  BP: 136/76  Pulse: 84  Temp: 98.5 F (36.9 C)   Nodes: There is no palpable supraclavicular, inguinal or femoral lymphadenopathy.  Abdomen: Soft without masses organomegaly.  Pelvic examination: There is distal anal canal stenosis which does admit my fifth finger.  There is no palpable evidence for recurrent disease.  Impression: No evidence for recurrent disease.  Plan: Follow-up visit with me in 8 months.  She'll maintain medical oncology follow-up in March.

## 2014-12-31 DIAGNOSIS — Z01419 Encounter for gynecological examination (general) (routine) without abnormal findings: Secondary | ICD-10-CM | POA: Diagnosis not present

## 2014-12-31 DIAGNOSIS — Z6830 Body mass index (BMI) 30.0-30.9, adult: Secondary | ICD-10-CM | POA: Diagnosis not present

## 2015-01-24 ENCOUNTER — Ambulatory Visit (HOSPITAL_BASED_OUTPATIENT_CLINIC_OR_DEPARTMENT_OTHER): Payer: Medicare Other | Admitting: Oncology

## 2015-01-24 ENCOUNTER — Telehealth: Payer: Self-pay | Admitting: Oncology

## 2015-01-24 VITALS — BP 136/53 | HR 81 | Temp 98.1°F | Resp 18 | Ht 67.0 in | Wt 176.7 lb

## 2015-01-24 DIAGNOSIS — C20 Malignant neoplasm of rectum: Secondary | ICD-10-CM

## 2015-01-24 DIAGNOSIS — K649 Unspecified hemorrhoids: Secondary | ICD-10-CM

## 2015-01-24 DIAGNOSIS — C801 Malignant (primary) neoplasm, unspecified: Secondary | ICD-10-CM

## 2015-01-24 NOTE — Progress Notes (Signed)
  [place] OFFICE PROGRESS NOTE   Diagnosis: Anal cancer  INTERVAL HISTORY:   She returns as scheduled. She feels well. She reports undergoing a colonoscopy by Dr. Michail Sermon last year (see do not have the report available today). She is no longer performing self rectal dilation. Her bowels are moving daily. She takes fiber. No pain or bleeding. Good appetite.  Objective:  Vital signs in last 24 hours:  Blood pressure 136/53, pulse 81, temperature 98.1 F (36.7 C), temperature source Oral, resp. rate 18, height 5\' 7"  (1.702 m), weight 176 lb 11.2 oz (80.151 kg), SpO2 97 %.    HEENT: Neck without mass Lymphatics: No cervical, supraclavicular, axillary, or inguinal nodes Resp: Lungs clear bilaterally Cardio: Regular rate and rhythm GI: No hepatomegaly, nontender, no mass Vascular: No leg edema Rectal: Hemorrhoid at the left anal verge. No mass at the anal verge or anal margin. I could not get a finger past the distal rectal stricture     Medications: I have reviewed the patient's current medications.  Assessment/Plan: 1. Poorly differentiated carcinoma, basaloid squamous cell carcinoma, of the anal canal and posterior vagina. Staging CT scans of the abdomen and pelvis on 11/15/2011 with suspicious perirectal lymph node and no evidence of distant metastatic disease. She began radiation 11/29/2011. She completed cycle 1 mitomycin and 5 fluorouracil 11/29/2011. She completed cycle 2 beginning 12/27/2011. She completed the course of radiation on 01/05/2012. 2. Rectal bleeding secondary to #1. Resolved. 3. Erythema and desquamation related to radiation. Resolved. 4. Anemia. Resolved. 5. Status post placement right upper the PICC line 11/29/2011. The PICC line has been removed. 6. Depression.  7. Status post evaluation in the emergency Department on 02/09/2012 for shortness of breath. Chest CT was negative for evidence of a pulmonary embolism or other acute finding. She  has had no further shortness of breath. 8. Rectal stenosis. She is status post an exam under anesthesia with dilatation of the anal canal and biopsies of the posterior vaginal wall and anterior anal wall canal with pathology negative for evidence of malignancy 06/21/2012. Persistent rectal stenosis. She has been evaluated by Dr. Dalbert Batman 9. Hemorrhoid at the inferior left anal verge.   Disposition:  Ms.Piccini remains in clinical remission from anal cancer. She will schedule an appointment with Dr. Dalbert Batman for the summer of 2016. She is scheduled to see Dr. Valere Dross in August. She will return for an office visit in December 2016. We will see her sooner if she develops new symptoms.     Quintella Reichert, MD  01/24/2015  8:39 AM

## 2015-01-24 NOTE — Telephone Encounter (Signed)
Gave avs & calendar for December. °

## 2015-04-01 DIAGNOSIS — E785 Hyperlipidemia, unspecified: Secondary | ICD-10-CM | POA: Diagnosis not present

## 2015-04-01 DIAGNOSIS — Z6831 Body mass index (BMI) 31.0-31.9, adult: Secondary | ICD-10-CM | POA: Diagnosis not present

## 2015-04-01 DIAGNOSIS — E559 Vitamin D deficiency, unspecified: Secondary | ICD-10-CM | POA: Diagnosis not present

## 2015-04-01 DIAGNOSIS — R946 Abnormal results of thyroid function studies: Secondary | ICD-10-CM | POA: Diagnosis not present

## 2015-04-01 DIAGNOSIS — F419 Anxiety disorder, unspecified: Secondary | ICD-10-CM | POA: Diagnosis not present

## 2015-04-01 DIAGNOSIS — D72819 Decreased white blood cell count, unspecified: Secondary | ICD-10-CM | POA: Diagnosis not present

## 2015-04-01 DIAGNOSIS — R7989 Other specified abnormal findings of blood chemistry: Secondary | ICD-10-CM | POA: Diagnosis not present

## 2015-04-01 DIAGNOSIS — M8580 Other specified disorders of bone density and structure, unspecified site: Secondary | ICD-10-CM | POA: Diagnosis not present

## 2015-04-01 DIAGNOSIS — F325 Major depressive disorder, single episode, in full remission: Secondary | ICD-10-CM | POA: Diagnosis not present

## 2015-05-12 DIAGNOSIS — E039 Hypothyroidism, unspecified: Secondary | ICD-10-CM | POA: Diagnosis not present

## 2015-06-02 DIAGNOSIS — M9902 Segmental and somatic dysfunction of thoracic region: Secondary | ICD-10-CM | POA: Diagnosis not present

## 2015-06-02 DIAGNOSIS — M5134 Other intervertebral disc degeneration, thoracic region: Secondary | ICD-10-CM | POA: Diagnosis not present

## 2015-06-02 DIAGNOSIS — M5137 Other intervertebral disc degeneration, lumbosacral region: Secondary | ICD-10-CM | POA: Diagnosis not present

## 2015-06-02 DIAGNOSIS — M9904 Segmental and somatic dysfunction of sacral region: Secondary | ICD-10-CM | POA: Diagnosis not present

## 2015-06-02 DIAGNOSIS — M9903 Segmental and somatic dysfunction of lumbar region: Secondary | ICD-10-CM | POA: Diagnosis not present

## 2015-06-02 DIAGNOSIS — M5417 Radiculopathy, lumbosacral region: Secondary | ICD-10-CM | POA: Diagnosis not present

## 2015-06-09 DIAGNOSIS — M9904 Segmental and somatic dysfunction of sacral region: Secondary | ICD-10-CM | POA: Diagnosis not present

## 2015-06-09 DIAGNOSIS — M5134 Other intervertebral disc degeneration, thoracic region: Secondary | ICD-10-CM | POA: Diagnosis not present

## 2015-06-09 DIAGNOSIS — M5417 Radiculopathy, lumbosacral region: Secondary | ICD-10-CM | POA: Diagnosis not present

## 2015-06-09 DIAGNOSIS — M5137 Other intervertebral disc degeneration, lumbosacral region: Secondary | ICD-10-CM | POA: Diagnosis not present

## 2015-06-09 DIAGNOSIS — M9902 Segmental and somatic dysfunction of thoracic region: Secondary | ICD-10-CM | POA: Diagnosis not present

## 2015-06-09 DIAGNOSIS — M9903 Segmental and somatic dysfunction of lumbar region: Secondary | ICD-10-CM | POA: Diagnosis not present

## 2015-06-24 ENCOUNTER — Ambulatory Visit: Payer: BC Managed Care – PPO | Admitting: Radiation Oncology

## 2015-07-10 ENCOUNTER — Ambulatory Visit
Admission: RE | Admit: 2015-07-10 | Discharge: 2015-07-10 | Disposition: A | Discharge status: Home / Self Care | Payer: BC Managed Care – PPO | Source: Ambulatory Visit | Attending: Radiation Oncology | Admitting: Radiation Oncology

## 2015-07-10 ENCOUNTER — Encounter: Payer: Self-pay | Admitting: Radiation Oncology

## 2015-07-10 VITALS — BP 139/58 | HR 67 | Temp 98.2°F | Ht 67.0 in | Wt 161.2 lb

## 2015-07-10 DIAGNOSIS — C211 Malignant neoplasm of anal canal: Secondary | ICD-10-CM | POA: Diagnosis not present

## 2015-07-10 DIAGNOSIS — K624 Stenosis of anus and rectum: Secondary | ICD-10-CM

## 2015-07-10 NOTE — Progress Notes (Signed)
CC: Dr. [name], Dr. Fanny Skates,  Ned Card NP  Follow-up note: The patient returns today approximately 3 years and 6 months following completion of chemoradiation in the management of her T4 N0 squamous cell carcinoma of the anal canal.  She tells me she has been released by Dr. Dalbert Batman.  He did advise her to practice self anal canal dilatations but she has not been religious about this.  She is not had any rectal bleeding.  No GU or GI difficulties.  She is on a stool softener.  Physical examination: Alert and oriented.  Filed Vitals:   07/10/15 1317  BP: 139/58  Pulse: 67  Temp: 98.2 F (36.8 C)   Abdomen: Without masses organomegaly.  Nodes: There is no palpable inguinal or femoral lymphadenopathy.  Pelvic examination: Rectovaginal examination normal except for moderate anal stenosis which barely admits my index finger.  There is some scarring along the anal canal but no palpable evidence for recurrent disease.  Extremities: Without edema.  Impression: No evidence for recurrent disease.  I encouraged her to resume self anal canal dilatations.  Plan: She will see Ned Card NP in December and return here for a follow-up visit in 6 months.

## 2015-07-10 NOTE — Progress Notes (Signed)
Ms. Harlan reports that she does not have any proctitis, diarrhea nor fatigue.  Eating regular diet.   No other voiced concerns.  BP 139/58 mmHg  Pulse 67  Temp(Src) 98.2 F (36.8 C)  Ht 5\' 7"  (1.702 m)  Wt 161 lb 3.2 oz (73.12 kg)  BMI 25.24 kg/m2   Wt Readings from Last 3 Encounters:  07/10/15 161 lb 3.2 oz (73.12 kg)  01/24/15 176 lb 11.2 oz (80.151 kg)  10/29/14 177 lb 1.6 oz (80.332 kg)

## 2015-10-13 ENCOUNTER — Telehealth: Payer: Self-pay | Admitting: Oncology

## 2015-10-13 NOTE — Telephone Encounter (Signed)
DUE to LT out per BS moved from 12/14 to jan. Begin to leave a message then patient answered - gave  Patient avs report and appointment for 1/11.

## 2015-10-14 NOTE — Telephone Encounter (Signed)
I spoke with patient re appointment for 1/11 - patient wan not given and avs report.

## 2015-10-17 ENCOUNTER — Ambulatory Visit: Payer: BC Managed Care – PPO | Admitting: Nurse Practitioner

## 2015-11-12 ENCOUNTER — Telehealth: Payer: Self-pay | Admitting: Oncology

## 2015-11-12 ENCOUNTER — Ambulatory Visit (HOSPITAL_BASED_OUTPATIENT_CLINIC_OR_DEPARTMENT_OTHER): Payer: Medicare Other | Admitting: Nurse Practitioner

## 2015-11-12 VITALS — BP 135/68 | HR 85 | Temp 98.5°F | Resp 18 | Ht 67.0 in | Wt 163.9 lb

## 2015-11-12 DIAGNOSIS — C801 Malignant (primary) neoplasm, unspecified: Secondary | ICD-10-CM

## 2015-11-12 DIAGNOSIS — Z85048 Personal history of other malignant neoplasm of rectum, rectosigmoid junction, and anus: Secondary | ICD-10-CM | POA: Diagnosis not present

## 2015-11-12 NOTE — Progress Notes (Addendum)
  [place] OFFICE PROGRESS NOTE   Diagnosis:  Anal cancer  INTERVAL HISTORY:   She returns as scheduled. She feels well. She has a good appetite. She remains very active. She is keeping her grandchildren 2 days a week. Bowels moving on a daily basis. No pain with bowel movements. She occasionally has rectal bleeding which she relates to "hemorrhoids". She is performing self rectal dilatation every other day.  Objective:  Vital signs in last 24 hours:  Blood pressure 135/68, pulse 85, temperature 98.5 F (36.9 C), temperature source Oral, resp. rate 18, height 5\' 7"  (1.702 m), weight 163 lb 14.4 oz (74.345 kg), SpO2 97 %.    HEENT: No thrush or ulcers. Lymphatics: No palpable cervical, supraclavicular, axillary or inguinal lymph nodes. Resp: Lungs clear bilaterally. Cardio: Regular rate and rhythm. GI: Abdomen soft and nontender. No hepatomegaly. No mass. Vascular: No leg edema. Rectal: Small hemorrhoid right anal verge; larger, lobulated hemorrhoid left anal verge. Anal stenosis, unable to pass index finger.   Lab Results:  Lab Results  Component Value Date   WBC 3.0* 04/25/2012   HGB 13.1 04/25/2012   HCT 38.6 04/25/2012   MCV 98.3 04/25/2012   PLT 181 04/25/2012   NEUTROABS 1.7 04/25/2012    Imaging:  No results found.  Medications: I have reviewed the patient's current medications.  Assessment/Plan: 1. Poorly differentiated carcinoma, basaloid squamous cell carcinoma, of the anal canal and posterior vagina. Staging CT scans of the abdomen and pelvis on 11/15/2011 with suspicious perirectal lymph node and no evidence of distant metastatic disease. She began radiation 11/29/2011. She completed cycle 1 mitomycin and 5 fluorouracil 11/29/2011. She completed cycle 2 beginning 12/27/2011. She completed the course of radiation on 01/05/2012. 2. Rectal bleeding secondary to #1. Resolved. 3. Erythema and desquamation related to radiation.  Resolved. 4. Anemia. Resolved. 5. Status post placement right upper the PICC line 11/29/2011. The PICC line has been removed. 6. Depression.  7. Status post evaluation in the emergency Department on 02/09/2012 for shortness of breath. Chest CT was negative for evidence of a pulmonary embolism or other acute finding. She has had no further shortness of breath. 8. Rectal stenosis. She is status post an exam under anesthesia with dilatation of the anal canal and biopsies of the posterior vaginal wall and anterior anal wall canal with pathology negative for evidence of malignancy 06/21/2012. Persistent rectal stenosis. She has been evaluated by Dr. Dalbert Batman 9. Hemorrhoid at the inferior left anal verge.   Disposition: Ms. Beagan remains in clinical remission from anal cancer. She is scheduled to see Dr. Lisbeth Renshaw next month. She will return for a follow-up visit here in 9 months. She will contact the office in the interim with any problems.  Patient seen with Dr. Benay Spice.    Ned Card ANP/GNP-BC   11/12/2015  11:00 AM  This was a shared visit with Ned Card. Ms. Francke was interviewed and examined. She remains in clinical remission from anal cancer.  Julieanne Manson, M.D.

## 2015-11-12 NOTE — Telephone Encounter (Signed)
Gave patient avs report and appointment for October  °

## 2015-12-08 ENCOUNTER — Encounter: Payer: Self-pay | Admitting: Radiation Oncology

## 2015-12-08 ENCOUNTER — Ambulatory Visit
Admission: RE | Admit: 2015-12-08 | Discharge: 2015-12-08 | Disposition: A | Discharge status: Home / Self Care | Payer: BC Managed Care – PPO | Source: Ambulatory Visit | Attending: Radiation Oncology | Admitting: Radiation Oncology

## 2015-12-08 VITALS — BP 129/51 | HR 80 | Temp 97.6°F | Resp 20 | Ht 67.0 in | Wt 164.8 lb

## 2015-12-08 DIAGNOSIS — C211 Malignant neoplasm of anal canal: Secondary | ICD-10-CM

## 2015-12-08 NOTE — Patient Instructions (Signed)
Contact our office if you have any questions following today's appointment: [phone].  

## 2015-12-08 NOTE — Progress Notes (Addendum)
Radiation Oncology         [phone] ________________________________  Name: [name]: [id number]  Date: 12/08/2015  DOB: 1946/03/03  Follow-Up Visit Note  CC: Vidal Schwalbe, MD  Fanny Skates, MD  Diagnosis: Stage IIIB (T4, N0, M0) squamous cell carcinoma of the anal canal  No diagnosis found.  Interval Since Last Radiation:  3 years and 11 months.  11/29/2011 through 01/05/2012: Anal canal 5040 cGy 28 sessions, regional lymph nodes 4200 cGy 28 sessions  Narrative:  The patient returns today for routine follow-up. She was under the care of Dr. Valere Dross. The patient saw Dr. Benay Spice on 11/12/15.    Overall patient reports that she is doing well on review of systems , she has been having some depression which is kept her from working. She is trying to work through that at this time but date this is not related to her history of cancer. She denies any abdominal pain, nausea, vomiting, fevers or chills chest pain or shortness of breath. She has not noticed any unintended weight changes. She plans to meet back with Dr. Michail Sermon later this year for colonoscopy screening. She states that she continues to use her finger as a anal dilator and states that she does not have trouble with bleeding when she has a bowel movement. She states that on occasion she will  notice spotting after dilating the anus but otherwise denies any rectal bleeding. A complete review of systems is obtained and is otherwise negative.  ALLERGIES:  has No Known Allergies.  Meds: Current Outpatient Prescriptions  Medication Sig Dispense Refill  . amitriptyline (ELAVIL) 25 MG tablet Take 25 mg by mouth at bedtime.    . ARIPiprazole (ABILIFY) 30 MG tablet Take 30 mg by mouth daily.    . busPIRone (BUSPAR) 10 MG tablet Take 10 mg by mouth daily.     . calcium carbonate (TUMS - DOSED IN MG ELEMENTAL CALCIUM) 500 MG chewable tablet Chew 1 tablet by mouth 2 (two) times daily.    . cholecalciferol (VITAMIN D) 1000  UNITS tablet Take 1,000 Units by mouth daily.    . DULoxetine (CYMBALTA) 60 MG capsule Take 60 mg by mouth daily.    . mirtazapine (REMERON) 15 MG tablet 15 mg at bedtime.     . niacin 250 MG tablet Take 250 mg by mouth daily with breakfast.    . pravastatin (PRAVACHOL) 40 MG tablet Take 40 mg by mouth daily with breakfast.     . Wheat Dextrin (BENEFIBER DRINK MIX PO) Take by mouth. Reported on 12/08/2015     No current facility-administered medications for this encounter.   Facility-Administered Medications Ordered in Other Encounters  Medication Dose Route Frequency Provider Last Rate Last Dose  . sodium chloride 0.9 % injection 10 mL  10 mL Intracatheter PRN Ladell Pier, MD        Physical Findings: his former as there is a  height is 5\' 7"  (1.702 m) and weight is 164 lb 12.8 oz (74.753 kg). Her oral temperature is 97.6 F (36.4 C). Her blood pressure is 129/51 and her pulse is 80. Her respiration is 20.  Pain Scale 0/10  in general this is a well-appearing Caucasian female in no acute distress. She's alert and oriented times for appropriate throughout the examination. Cardiovascular exam reveals a regular rate and rhythm no clicks rubs or murmurs auscultated. Clear to auscultation bilaterally. No palpable adenopathy is noted at this clavicular, cervical or axillary chains. The abdomen  is intact with active bowel sounds and is soft, non tender, non distended. Pelvic exam reveals normal-appearing external female genitalia, labial perineal and perianal tissue. No lesions are seen of the vulva, and 2 small hemorrhoids are appreciated externally. No bleeding is identified. No other lesions are present. Rectal exam reveals an intact tone, no induration or nodularity is present.  Lab Findings: Lab Results  Component Value Date   WBC 3.0* 04/25/2012   WBC 4.5 02/09/2012   HGB 13.1 04/25/2012   HGB 10.1* 02/09/2012   HCT 38.6 04/25/2012   HCT 30.9* 02/09/2012   PLT 181 04/25/2012   PLT 235  02/09/2012    Lab Results  Component Value Date   NA 138 02/09/2012   K 4.0 02/09/2012   CO2 27 02/09/2012   GLUCOSE 101* 02/09/2012   BUN 15 02/09/2012   CREATININE 0.60 02/09/2012   BILITOT 0.3 01/25/2012   ALKPHOS 84 01/25/2012   AST 21 01/25/2012   ALT 13 01/25/2012   PROT 7.0 01/25/2012   ALBUMIN 3.4* 01/25/2012   CALCIUM 9.2 02/09/2012    Radiographic Findings: No results found.  Impression:     History of T4 N0 squamous cell carcinoma of the anal canal status post chemotherapy and radiation  Plan:  The patient appears to be clinically disease. We will follow up with her in 6 months time for continued evaluation. She was encouraged to call if she has any questions or concerns that arise prior to that visit. I discussed with her that we would likely move to annual visits following her next, provided that she continues to be NED.  Carola Rhine, PAC  This document serves as a record of services personally performed by Shona Simpson, [facility] It was created on her behalf by Darcus Austin, a trained medical scribe. The creation of this record is based on the scribe's personal observations and the provider's statements to them. This document has been checked and approved by the attending provider.

## 2015-12-08 NOTE — Addendum Note (Signed)
Encounter addended by: Hayden Pedro, PA-C on: 12/08/2015  2:31 PM<BR>     Documentation filed: Follow-up Section

## 2015-12-08 NOTE — Progress Notes (Signed)
Follow up   radiation txs from 11/29/11-01/05/12 rectal  Saw Dr. Benay Spice 11/12/15,  Doing well, but depression since stopped working ,  Bowels moving regularly  No issues with bladder, appetite too good, energy level good, walks every day   Follow up with Dr. Benay Spice in 9 months; Was  Patient of Dr. Valere Dross   Last seen 07/10/15    BP 129/51 mmHg  Pulse 80  Temp(Src) 97.6 F (36.4 C) (Oral)  Resp 20  Ht 5\' 7"  (1.702 m)  Wt 164 lb 12.8 oz (74.753 kg)  BMI 25.81 kg/m2  Wt Readings from Last 3 Encounters:  12/08/15 164 lb 12.8 oz (74.753 kg)  11/12/15 163 lb 14.4 oz (74.345 kg)  07/10/15 161 lb 3.2 oz (73.12 kg)

## 2015-12-11 ENCOUNTER — Ambulatory Visit: Payer: Self-pay | Admitting: Radiation Oncology

## 2016-01-06 DIAGNOSIS — Z01419 Encounter for gynecological examination (general) (routine) without abnormal findings: Secondary | ICD-10-CM | POA: Diagnosis not present

## 2016-01-06 DIAGNOSIS — N958 Other specified menopausal and perimenopausal disorders: Secondary | ICD-10-CM | POA: Diagnosis not present

## 2016-01-06 DIAGNOSIS — Z6827 Body mass index (BMI) 27.0-27.9, adult: Secondary | ICD-10-CM | POA: Diagnosis not present

## 2016-01-06 DIAGNOSIS — M8588 Other specified disorders of bone density and structure, other site: Secondary | ICD-10-CM | POA: Diagnosis not present

## 2016-01-06 DIAGNOSIS — Z1231 Encounter for screening mammogram for malignant neoplasm of breast: Secondary | ICD-10-CM | POA: Diagnosis not present

## 2016-01-07 DIAGNOSIS — J101 Influenza due to other identified influenza virus with other respiratory manifestations: Secondary | ICD-10-CM | POA: Diagnosis not present

## 2016-01-08 ENCOUNTER — Other Ambulatory Visit: Payer: Self-pay | Admitting: Obstetrics and Gynecology

## 2016-01-08 DIAGNOSIS — R928 Other abnormal and inconclusive findings on diagnostic imaging of breast: Secondary | ICD-10-CM

## 2016-01-21 ENCOUNTER — Ambulatory Visit
Admission: RE | Admit: 2016-01-21 | Discharge: 2016-01-21 | Disposition: A | Discharge status: Home / Self Care | Payer: Medicare Other | Source: Ambulatory Visit | Attending: Obstetrics and Gynecology | Admitting: Obstetrics and Gynecology

## 2016-01-21 DIAGNOSIS — R928 Other abnormal and inconclusive findings on diagnostic imaging of breast: Secondary | ICD-10-CM | POA: Diagnosis not present

## 2016-01-21 DIAGNOSIS — N6012 Diffuse cystic mastopathy of left breast: Secondary | ICD-10-CM | POA: Diagnosis not present

## 2016-04-21 DIAGNOSIS — E559 Vitamin D deficiency, unspecified: Secondary | ICD-10-CM | POA: Diagnosis not present

## 2016-04-21 DIAGNOSIS — F3341 Major depressive disorder, recurrent, in partial remission: Secondary | ICD-10-CM | POA: Diagnosis not present

## 2016-04-21 DIAGNOSIS — E039 Hypothyroidism, unspecified: Secondary | ICD-10-CM | POA: Diagnosis not present

## 2016-04-21 DIAGNOSIS — E785 Hyperlipidemia, unspecified: Secondary | ICD-10-CM | POA: Diagnosis not present

## 2016-04-28 DIAGNOSIS — H2513 Age-related nuclear cataract, bilateral: Secondary | ICD-10-CM | POA: Diagnosis not present

## 2016-04-28 DIAGNOSIS — H52223 Regular astigmatism, bilateral: Secondary | ICD-10-CM | POA: Diagnosis not present

## 2016-04-28 DIAGNOSIS — H524 Presbyopia: Secondary | ICD-10-CM | POA: Diagnosis not present

## 2016-04-28 DIAGNOSIS — H25013 Cortical age-related cataract, bilateral: Secondary | ICD-10-CM | POA: Diagnosis not present

## 2016-04-28 DIAGNOSIS — H11153 Pinguecula, bilateral: Secondary | ICD-10-CM | POA: Diagnosis not present

## 2016-04-28 DIAGNOSIS — H5203 Hypermetropia, bilateral: Secondary | ICD-10-CM | POA: Diagnosis not present

## 2016-06-07 ENCOUNTER — Encounter: Payer: Self-pay | Admitting: Radiation Oncology

## 2016-06-07 ENCOUNTER — Ambulatory Visit
Admission: RE | Admit: 2016-06-07 | Discharge: 2016-06-07 | Disposition: A | Discharge status: Home / Self Care | Payer: Medicare Other | Source: Ambulatory Visit | Attending: Radiation Oncology | Admitting: Radiation Oncology

## 2016-06-07 VITALS — BP 134/63 | HR 70 | Temp 97.5°F | Resp 20 | Ht 67.0 in | Wt 157.4 lb

## 2016-06-07 DIAGNOSIS — Z1239 Encounter for other screening for malignant neoplasm of breast: Secondary | ICD-10-CM

## 2016-06-07 DIAGNOSIS — Z923 Personal history of irradiation: Secondary | ICD-10-CM | POA: Diagnosis not present

## 2016-06-07 DIAGNOSIS — E785 Hyperlipidemia, unspecified: Secondary | ICD-10-CM | POA: Insufficient documentation

## 2016-06-07 DIAGNOSIS — E559 Vitamin D deficiency, unspecified: Secondary | ICD-10-CM | POA: Diagnosis not present

## 2016-06-07 DIAGNOSIS — F329 Major depressive disorder, single episode, unspecified: Secondary | ICD-10-CM | POA: Insufficient documentation

## 2016-06-07 DIAGNOSIS — Z08 Encounter for follow-up examination after completed treatment for malignant neoplasm: Secondary | ICD-10-CM | POA: Diagnosis not present

## 2016-06-07 DIAGNOSIS — Z85048 Personal history of other malignant neoplasm of rectum, rectosigmoid junction, and anus: Secondary | ICD-10-CM | POA: Diagnosis not present

## 2016-06-07 DIAGNOSIS — F419 Anxiety disorder, unspecified: Secondary | ICD-10-CM | POA: Insufficient documentation

## 2016-06-07 DIAGNOSIS — Z87891 Personal history of nicotine dependence: Secondary | ICD-10-CM | POA: Diagnosis not present

## 2016-06-07 DIAGNOSIS — C211 Malignant neoplasm of anal canal: Secondary | ICD-10-CM | POA: Insufficient documentation

## 2016-06-07 NOTE — Progress Notes (Signed)
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 Malignant neoplasm of anal canal (HCC) 154.2 C21.1     Interval Since Last Radiation:  4 years and 6 months.  11/29/2011 through 01/05/2012: Anal canal 50.4 Gy 28 fractions, regional lymph nodes 4200 cGy 28 fractions  Narrative:  The patient returns today for routine follow-up status post radiation treatments to the anal canal 11/29/2011 - 01/05/2012.  On review of systems, the patient reports that she is doing well overall. She denies any chest pain, shortness of breath, cough, fevers, chills, night sweats, unintended weight changes. She denies any bowel or bladder disturbances and continues to use prune juice, and denies abdominal pain, nausea or vomiting. She also continues to use a candle as a rectal dilator. She denies having had anoscopy or colonoscopy in the last year. She denies any new musculoskeletal or joint aches or pains. A complete review of systems is obtained and is otherwise negative.  Past Medical History:  Past Medical History:  Diagnosis Date  . 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 Anxiety   . Depression   . Full dentures   . Hemorrhoids    external  . History of radiation therapy 11/29/11 to 01/05/12   anal canal 5040 cGy 28 sessions, regional lymph nodes 4200 cGy 28 sessions  . Hyperlipidemia   . Rectal bleeding   . Vitamin D deficiency   . Wears glasses     Past Surgical History: Past Surgical History:  Procedure Laterality Date  . ABDOMINAL HYSTERECTOMY  1978   age 54  . EXAMINATION UNDER ANESTHESIA  06/21/2012   Procedure: EXAM UNDER ANESTHESIA;  Surgeon: [name], MD;  Location: [facility];  Service:  General;  Laterality: N/A;  rectal exam under anesthesia, anal dilation, rectal biopsy  . [place]   Patient had to guess on the date.  [name] RECTAL BIOPSY  06/21/2012   Procedure: BIOPSY RECTAL;  Surgeon: [name], MD;  Location: [facility];  Service: General;  Laterality: N/A;  . TUBAL LIGATION     b/l  age 51    Social History:  Social History   Social History  . Marital status: Single    Spouse name: N/A  . Number of children: N/A  . Years of education: N/A   Occupational History  . Not on file.   Social History Main Topics  . Smoking status: Former Smoker    Packs/day: 0.50    Years: 25.00    Types: Cigarettes    Quit date: 09/07/1987  . Smokeless tobacco: Never Used  . Alcohol use No  . Drug use: No  . Sexual activity: Not on file   Other Topics Concern  . Not on file   Social History Narrative   Divorced, 1 child, works in [employer]    Family History: Family History  Problem Relation Age of Onset  . Cancer Sister     breast/ age 56  . Alcohol abuse Maternal Uncle   . Cervical cancer Sister     50    ALLERGIES:  has No Known Allergies.  Meds: Current Outpatient Prescriptions  Medication Sig Dispense Refill  . amitriptyline (ELAVIL) 25 MG tablet Take 25 mg by mouth at bedtime.    . ARIPiprazole (ABILIFY) 30 MG tablet Take 30 mg by mouth daily.    . busPIRone (BUSPAR) 10 MG tablet Take 10 mg by mouth daily.     . calcium carbonate (TUMS - DOSED IN MG ELEMENTAL CALCIUM) 500 MG chewable tablet Chew 1 tablet by mouth 2 (two) times daily.    . cholecalciferol (VITAMIN D) 1000 UNITS tablet Take 1,000 Units by mouth daily.    . DULoxetine (CYMBALTA) 60 MG capsule Take 60 mg by mouth daily.    . mirtazapine (REMERON) 15 MG tablet 15 mg at bedtime.     . niacin 250 MG tablet Take 250 mg by mouth daily with breakfast.    . pravastatin (PRAVACHOL) 40 MG tablet Take 40 mg by mouth daily with breakfast.     . Wheat Dextrin  (BENEFIBER DRINK MIX PO) Take by mouth. Reported on 12/08/2015     No current facility-administered medications for this encounter.    Facility-Administered Medications Ordered in Other Encounters  Medication Dose Route Frequency Provider Last Rate Last Dose  . sodium chloride 0.9 % injection 10 mL  10 mL Intracatheter PRN Ladell Pier, MD        Physical Findings: his former as there is a  height is 5\' 7"  (1.702 m) and weight is 157 lb 6.4 oz (71.4 kg). Her oral temperature is 97.5 F (36.4 C). Her blood pressure is 134/63 and her pulse is 70. Her respiration is 20.  Pain Scale 0/10  in general this is a well-appearing Caucasian female in no acute distress. She's alert and oriented times for appropriate throughout the examination. Cardiovascular exam reveals a regular rate and rhythm no clicks rubs or murmurs auscultated. Clear to auscultation bilaterally. No palpable adenopathy is noted at this clavicular, cervical or axillary chains. The abdomen is intact with active bowel sounds and is soft, non tender, non distended. Pelvic exam reveals normal-appearing external female genitalia, labial perineal and perianal tissue. No lesions are seen of the vulva, and 2 small hemorrhoids are appreciated externally, these are unchanged since her last visit. No bleeding is identified. No other lesions are present. Digital rectal exam reveals an intact tone, no induration or nodularity is present.   Lab Findings: Lab Results  Component Value Date   WBC 3.0 (L) 04/25/2012   WBC 4.5 02/09/2012   HGB 13.1 04/25/2012   HCT 38.6 04/25/2012   PLT 181 04/25/2012    Lab Results  Component Value Date   NA 138 02/09/2012   K 4.0 02/09/2012   CO2 27 02/09/2012   GLUCOSE 101 (H) 02/09/2012   BUN 15 02/09/2012   CREATININE 0.60 02/09/2012   BILITOT 0.3 01/25/2012   ALKPHOS 84 01/25/2012   AST 21 01/25/2012   ALT 13 01/25/2012   PROT 7.0 01/25/2012   ALBUMIN 3.4 (L) 01/25/2012   CALCIUM 9.2 02/09/2012      Radiographic Findings: No results found.  Impression/Plan:      1. History of Stage T4 N0 squamous cell carcinoma of the anal canal status post chemotherapy and radiation. The patient appears to be clinically without evidence of disease. We will follow up with her in 1 years' time for continued evaluation and she is in need of direct visualization of the rectum. She does not have a relationship with a colorectal surgeon, and I have asked her to be seen by a  GI physician. She was a patient at [facility] and we will coordinate follow up with her there. She was encouraged to call if she has any questions or concerns that arise prior to her next visit. 2. GYN Care. The patient is interested in consolidating her GYN care with me. I would be happy to follow her with annual pelvic exams. We will obtain records from her GYN office and will perform annual exams and pap smears only if clinically indicated, as she is not of age for screening exams if she's had normal cytology in the last 10 years, per ASCCP guidelines. 3. Breast Health. The patient is due a mammogram in January 2018. I will coordinate this for her and follow up with the results when available. She states agreement and understanding.     Carola Rhine, PAC

## 2016-06-07 NOTE — Progress Notes (Signed)
Follow up s/p rad txs anal canal 11/29/11-01/05/12    No  C/o pain, but has bowel movements  With taking prune juice every day  , no nausea,   Appetite great, no fatigue 11:34 AM BP 134/63 (BP Location: Left Arm, Patient Position: Sitting, Cuff Size: Normal)   Pulse 70   Temp 97.5 F (36.4 C) (Oral)   Resp 20   Ht 5\' 7"  (1.702 m)   Wt 157 lb 6.4 oz (71.4 kg)   BMI 24.65 kg/m   Wt Readings from Last 3 Encounters:  06/07/16 157 lb 6.4 oz (71.4 kg)  12/08/15 164 lb 12.8 oz (74.8 kg)  11/12/15 163 lb 14.4 oz (74.3 kg)

## 2016-06-08 ENCOUNTER — Telehealth: Payer: Self-pay | Admitting: *Deleted

## 2016-06-08 NOTE — Telephone Encounter (Signed)
CALLED PATIENT TO INFORM OF APPT. WITH DR. Michail Sermon ON 06-11-16- ARRIVAL TIME - 10:30 AM, LVM FOR A RETURN CALL

## 2016-06-28 ENCOUNTER — Telehealth: Payer: Self-pay | Admitting: *Deleted

## 2016-06-28 NOTE — Telephone Encounter (Signed)
Received fax from [facility] with pt's bone density and mammogram result. Placed on Dr. Gearldine Shown desk for review.

## 2016-07-15 DIAGNOSIS — K625 Hemorrhage of anus and rectum: Secondary | ICD-10-CM | POA: Diagnosis not present

## 2016-07-15 DIAGNOSIS — C210 Malignant neoplasm of anus, unspecified: Secondary | ICD-10-CM | POA: Diagnosis not present

## 2016-07-15 NOTE — Addendum Note (Signed)
Encounter addended by: Elli Groesbeck B Katrell Milhorn, RN on: 07/15/2016  8:23 AM<BR>    Actions taken: Charge Capture section accepted

## 2016-08-04 DIAGNOSIS — Z23 Encounter for immunization: Secondary | ICD-10-CM | POA: Diagnosis not present

## 2016-08-09 ENCOUNTER — Telehealth: Payer: Self-pay | Admitting: *Deleted

## 2016-08-09 NOTE — Telephone Encounter (Signed)
Discussed pt's call with Dr. Benay Spice: Follow up 10/10 for office visit/ exam as scheduled. Will not need rectal exam in office this visit. Pt voiced understanding.

## 2016-08-09 NOTE — Telephone Encounter (Signed)
Call received @ 820 pt. Had questions regarding her appt with Dr. Benay Spice @ 1230 on 10/10 she stated that she had and appt. to have a colonoscopy on Wed. And was wanting to know if she needed to see a Dr. Benay Spice on 10/10 or later date

## 2016-08-10 ENCOUNTER — Ambulatory Visit (HOSPITAL_BASED_OUTPATIENT_CLINIC_OR_DEPARTMENT_OTHER): Payer: Medicare Other | Admitting: Oncology

## 2016-08-10 ENCOUNTER — Telehealth: Payer: Self-pay | Admitting: Oncology

## 2016-08-10 VITALS — BP 132/78 | HR 75 | Temp 97.9°F | Resp 17 | Ht 67.0 in | Wt 157.2 lb

## 2016-08-10 DIAGNOSIS — C801 Malignant (primary) neoplasm, unspecified: Secondary | ICD-10-CM

## 2016-08-10 DIAGNOSIS — Z85048 Personal history of other malignant neoplasm of rectum, rectosigmoid junction, and anus: Secondary | ICD-10-CM | POA: Diagnosis not present

## 2016-08-10 NOTE — Progress Notes (Signed)
  [facility] OFFICE PROGRESS NOTE   Diagnosis: Anal cancer  INTERVAL HISTORY:   [name] returns as scheduled. She feels well. Good appetite. She has lost weight by walking. No difficulty with bowel function. She is performing self dilation of an anal stricture. She has a hemorrhoid and bleeds after performing self dilation. She is scheduled for colonoscopy tomorrow. She saw radiation oncology in August.  Objective:  Vital signs in last 24 hours:  Blood pressure 132/78, pulse 75, temperature 97.9 F (36.6 C), resp. rate 17, height 5\' 7"  (1.702 m), weight 157 lb 3.2 oz (71.3 kg), SpO2 99 %.    HEENT: Neck without mass Lymphatics: No cervical, supraclavicular, axillary, inguinal, or femoral nodes Resp: Lungs clear bilaterally Cardio: Regular rate and rhythm GI: No hepatosplenomegaly, no mass, nontender; rectal-external exam reveals hemorrhoids/skin tans at the left and right anal verge. No suspicious skin lesion ( internal exam deferred secondary to planned colonoscopy tomorrow). Vascular: No leg edema Skin: Radiation changes at the perineum.   Medications: I have reviewed the patient's current medications.  Assessment/Plan: 1. Poorly differentiated carcinoma, basaloid squamous cell carcinoma, of the anal canal and posterior vagina. Staging CT scans of the abdomen and pelvis on 11/15/2011 with suspicious perirectal lymph node and no evidence of distant metastatic disease. She began radiation 11/29/2011. She completed cycle 1 mitomycin and 5 fluorouracil 11/29/2011. She completed cycle 2 beginning 12/27/2011. She completed the course of radiation on 01/05/2012. 2. Rectal bleeding secondary to #1. Resolved. 3. Erythema and desquamation related to radiation. Resolved. 4. Anemia. Resolved. 5. Status post placement right upper the PICC line 11/29/2011. The PICC line has been removed. 6. Depression.  7. Status post evaluation in the emergency Department on 02/09/2012 for  shortness of breath. Chest CT was negative for evidence of a pulmonary embolism or other acute finding. She has had no further shortness of breath. 8. Rectal stenosis. She is status post an exam under anesthesia with dilatation of the anal canal and biopsies of the posterior vaginal wall and anterior anal wall canal with pathology negative for evidence of malignancy 06/21/2012. Persistent rectal stenosis. She has been evaluated by Dr. Dalbert Batman 9. Hemorrhoids    Disposition:  [name] remains in clinical remission from anal cancer. She is now almost 4 years out from diagnosis. She is scheduled for a colonoscopy tomorrow. She will see radiation oncology in August 2018. She will return for an office visit here in January of 2019.  Betsy Coder, MD  08/10/2016  12:45 PM

## 2016-08-10 NOTE — Telephone Encounter (Signed)
Per 10/10 los patient to return for f/u with LT January 2019. Not scheduling for 2019 at this time. Patient will call for 2019 follow up February 2018.

## 2016-08-11 DIAGNOSIS — Z85048 Personal history of other malignant neoplasm of rectum, rectosigmoid junction, and anus: Secondary | ICD-10-CM | POA: Diagnosis not present

## 2016-08-11 DIAGNOSIS — K640 First degree hemorrhoids: Secondary | ICD-10-CM | POA: Diagnosis not present

## 2016-10-16 ENCOUNTER — Other Ambulatory Visit: Payer: Self-pay | Admitting: Nurse Practitioner

## 2016-12-13 ENCOUNTER — Other Ambulatory Visit: Payer: Self-pay | Admitting: Family Medicine

## 2016-12-13 DIAGNOSIS — Z1231 Encounter for screening mammogram for malignant neoplasm of breast: Secondary | ICD-10-CM

## 2017-01-12 DIAGNOSIS — Z1231 Encounter for screening mammogram for malignant neoplasm of breast: Secondary | ICD-10-CM | POA: Diagnosis not present

## 2017-01-12 DIAGNOSIS — Z6829 Body mass index (BMI) 29.0-29.9, adult: Secondary | ICD-10-CM | POA: Diagnosis not present

## 2017-01-12 DIAGNOSIS — Z01419 Encounter for gynecological examination (general) (routine) without abnormal findings: Secondary | ICD-10-CM | POA: Diagnosis not present

## 2017-01-19 ENCOUNTER — Ambulatory Visit: Payer: Medicare Other

## 2017-04-27 DIAGNOSIS — F331 Major depressive disorder, recurrent, moderate: Secondary | ICD-10-CM | POA: Diagnosis not present

## 2017-04-27 DIAGNOSIS — Z5181 Encounter for therapeutic drug level monitoring: Secondary | ICD-10-CM | POA: Diagnosis not present

## 2017-04-27 DIAGNOSIS — F411 Generalized anxiety disorder: Secondary | ICD-10-CM | POA: Diagnosis not present

## 2017-05-11 DIAGNOSIS — H2513 Age-related nuclear cataract, bilateral: Secondary | ICD-10-CM | POA: Diagnosis not present

## 2017-07-13 DIAGNOSIS — N644 Mastodynia: Secondary | ICD-10-CM | POA: Diagnosis not present

## 2017-08-17 DIAGNOSIS — Z23 Encounter for immunization: Secondary | ICD-10-CM | POA: Diagnosis not present

## 2017-08-17 DIAGNOSIS — E559 Vitamin D deficiency, unspecified: Secondary | ICD-10-CM | POA: Diagnosis not present

## 2017-08-17 DIAGNOSIS — E039 Hypothyroidism, unspecified: Secondary | ICD-10-CM | POA: Diagnosis not present

## 2017-08-17 DIAGNOSIS — E785 Hyperlipidemia, unspecified: Secondary | ICD-10-CM | POA: Diagnosis not present

## 2017-08-17 DIAGNOSIS — Z85048 Personal history of other malignant neoplasm of rectum, rectosigmoid junction, and anus: Secondary | ICD-10-CM | POA: Diagnosis not present

## 2017-08-17 DIAGNOSIS — D72819 Decreased white blood cell count, unspecified: Secondary | ICD-10-CM | POA: Diagnosis not present

## 2017-08-17 DIAGNOSIS — F3341 Major depressive disorder, recurrent, in partial remission: Secondary | ICD-10-CM | POA: Diagnosis not present

## 2017-11-02 DIAGNOSIS — H9192 Unspecified hearing loss, left ear: Secondary | ICD-10-CM | POA: Diagnosis not present

## 2017-11-02 DIAGNOSIS — H938X2 Other specified disorders of left ear: Secondary | ICD-10-CM | POA: Diagnosis not present

## 2017-11-02 DIAGNOSIS — H6122 Impacted cerumen, left ear: Secondary | ICD-10-CM | POA: Diagnosis not present

## 2017-11-16 ENCOUNTER — Ambulatory Visit: Payer: Medicare Other | Admitting: Nurse Practitioner

## 2017-11-23 ENCOUNTER — Telehealth: Payer: Self-pay | Admitting: Nurse Practitioner

## 2017-11-23 ENCOUNTER — Encounter: Payer: Self-pay | Admitting: Nurse Practitioner

## 2017-11-23 ENCOUNTER — Inpatient Hospital Stay: Payer: Medicare Other | Attending: Nurse Practitioner | Admitting: Nurse Practitioner

## 2017-11-23 VITALS — BP 142/53 | HR 74 | Temp 97.6°F | Resp 15 | Wt 170.3 lb

## 2017-11-23 DIAGNOSIS — Z85048 Personal history of other malignant neoplasm of rectum, rectosigmoid junction, and anus: Secondary | ICD-10-CM

## 2017-11-23 DIAGNOSIS — C801 Malignant (primary) neoplasm, unspecified: Secondary | ICD-10-CM

## 2017-11-23 NOTE — Progress Notes (Signed)
  [facility] OFFICE PROGRESS NOTE   Diagnosis: Anal cancer  INTERVAL HISTORY:   [name] returns as scheduled.  Feels well.  No change in bowel habits.  She has occasional constipation and takes a laxative if needed.  No rectal bleeding.  No pain.  She has a good appetite.  Objective:  Vital signs in last 24 hours:  Blood pressure (!) 142/53, pulse 74, temperature 97.6 F (36.4 C), temperature source Oral, resp. rate 15, weight 170 lb 4.8 oz (77.2 kg), SpO2 99 %.    HEENT: Neck without mass. Lymphatics: No palpable cervical, supraclavicular, axillary or inguinal lymph nodes. Resp: Lungs clear bilaterally. Cardio: Regular rate and rhythm. GI: Abdomen soft and nontender.  No hepatomegaly.  External hemorrhoid/skin tags left and right anal verge.  Anal stenosis, able to pass index finger.  Question internal hemorrhoid anteriorly.  No mass palpated. Vascular: No leg edema.   Lab Results:  Lab Results  Component Value Date   WBC 3.0 (L) 04/25/2012   HGB 13.1 04/25/2012   HCT 38.6 04/25/2012   MCV 98.3 04/25/2012   PLT 181 04/25/2012   NEUTROABS 1.7 04/25/2012    Imaging:  No results found.  Medications: I have reviewed the patient's current medications.  Assessment/Plan: 1. Poorly differentiated carcinoma, basaloid squamous cell carcinoma, of the anal canal and posterior vagina. Staging CT scans of the abdomen and pelvis on 11/15/2011 with suspicious perirectal lymph node and no evidence of distant metastatic disease. She began radiation 11/29/2011. She completed cycle 1 mitomycin and 5 fluorouracil 11/29/2011. She completed cycle 2 beginning 12/27/2011. She completed the course of radiation on 01/05/2012. 2. Rectal bleeding secondary to #1. Resolved. 3. Erythema and desquamation related to radiation. Resolved. 4. Anemia. Resolved. 5. Status post placement right upper the PICC line 11/29/2011. The PICC line has been removed. 6. Depression.  7. Status  post evaluation in the emergency Department on 02/09/2012 for shortness of breath. Chest CT was negative for evidence of a pulmonary embolism or other acute finding. She has had no further shortness of breath. 8. Rectal stenosis. She is status post an exam under anesthesia with dilatation of the anal canal and biopsies of the posterior vaginal wall and anterior anal wall canal with pathology negative for evidence of malignancy 06/21/2012. Persistent rectal stenosis. She has been evaluated by Dr. Dalbert Batman 9. Hemorrhoids 10. Colonoscopy 08/11/2016-anal stenosis.  Internal hemorrhoids.  Repeat colonoscopy in 3 years for surveillance.     Disposition: [name] remains in clinical remission from anal cancer.  She is now 6 years out from initial diagnosis.  She will return for a follow-up visit in 1 year.  She will contact the office in the interim with any problems.  Plan reviewed with Dr. Benay Spice.    Ned Card ANP/GNP-BC   11/23/2017  10:05 AM

## 2017-11-23 NOTE — Telephone Encounter (Signed)
Scheduled appt per 1/23 los - Gave patient AVS and calender per los.  

## 2018-02-15 ENCOUNTER — Ambulatory Visit (INDEPENDENT_AMBULATORY_CARE_PROVIDER_SITE_OTHER): Payer: Medicare Other | Admitting: Orthopedic Surgery

## 2018-02-15 ENCOUNTER — Ambulatory Visit (INDEPENDENT_AMBULATORY_CARE_PROVIDER_SITE_OTHER): Payer: Medicare Other

## 2018-02-15 ENCOUNTER — Encounter (INDEPENDENT_AMBULATORY_CARE_PROVIDER_SITE_OTHER): Payer: Self-pay | Admitting: Orthopedic Surgery

## 2018-02-15 DIAGNOSIS — M25511 Pain in right shoulder: Secondary | ICD-10-CM

## 2018-02-18 ENCOUNTER — Encounter (INDEPENDENT_AMBULATORY_CARE_PROVIDER_SITE_OTHER): Payer: Self-pay | Admitting: Orthopedic Surgery

## 2018-02-18 NOTE — Progress Notes (Signed)
Office Visit Note   [name]           Date of Birth: Jan 14, 1946           MRN: [id number] Visit Date: 02/15/2018 Requested by: [name], MD [address], [place] [address] PCP: [name], MD  Subjective: Chief Complaint  Patient presents with  . Right Shoulder - Pain    HPI: [name] is a patient with right shoulder pain.  Been going on 2 months.  Started after pushing a heavy vacuum cleaner.  She is right-hand dominant.  Reports some difficulty getting dressed.  The pain will wake her from sleep at night.  She works at [employer].  She also keeps her grandchildren.  She states that it has been difficult for her to retire.  Describes deltoid pain.  She also reports popping for the past 2 months.  She is been to a chiropractor 3 times without help.              ROS: All systems reviewed are negative as they relate to the chief complaint within the history of present illness.  Patient denies  fevers or chills. Impression is right shoulder pain possible small rotator cuff tear with some mechanical symptoms on exam.  Assessment & Plan: Visit Diagnoses:  1. Right shoulder pain, unspecified chronicity     Plan: Plan is subacromial injection today with 4-week return to decide for or against further imaging.  Radiographs unremarkable.  This could be biceps tendon related or rotator cuff related.  See her back in 4 weeks.  May consider ultrasound of the shoulder at that time as well.  Follow-Up Instructions: Return in about 1 month (around 03/15/2018).   Orders:  Orders Placed This Encounter  Procedures  . XR Shoulder Right   No orders of the defined types were placed in this encounter.     Procedures: No procedures performed   Clinical Data: No additional findings.  Objective: Vital Signs: There were no vitals taken for this visit.  Physical Exam:   Constitutional: Patient appears well-developed HEENT:  Head:  Normocephalic Eyes:EOM are normal Neck: Normal range of motion Cardiovascular: Normal rate Pulmonary/chest: Effort normal Neurologic: Patient is alert Skin: Skin is warm Psychiatric: Patient has normal mood and affect    Ortho Exam: Orthopedic exam demonstrates good cervical spine range of motion.  Full active and passive range of motion of the right shoulder.  Does have some coarseness and popping on the right which is not present on the left.  No restriction of passive motion on the right side compared to the left.  Motor sensory function of the right arm is intact.  Positive impingement signs on the right negative apprehension relocation testing on the right no discrete AC joint tenderness on the right  Specialty Comments:  No specialty comments available.  Imaging: No results found.   PMFS History: Patient Active Problem List   Diagnosis Date Noted  . Hemorrhoids, external without complications 93/57/0177  . Depression   . Anxiety   . Rectal bleeding   . History of radiation therapy   . Malignant neoplasm of anal canal ([facility]) 11/15/2011  . Hemorrhoids, internal, with bleeding 09/07/2011  . Acquired anal stenosis 09/07/2011  . Hyperlipidemia 09/07/2011  . Family history of breast cancer in sister 09/07/2011   Past Medical History:  Diagnosis Date  . 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 Anxiety   . Depression   .  Full dentures   . Hemorrhoids    external  . History of radiation therapy 11/29/11 to 01/05/12   anal canal 5040 cGy 28 sessions, regional lymph nodes 4200 cGy 28 sessions  . Hyperlipidemia   . Rectal bleeding   . Vitamin D deficiency   . Wears glasses     Family History  Problem Relation Age of Onset  . Cancer Sister        breast/ age 9  . Alcohol abuse Maternal Uncle   . Cervical cancer Sister        45    Past Surgical History:  Procedure Laterality Date  . ABDOMINAL HYSTERECTOMY  1978   age 70  . EXAMINATION UNDER ANESTHESIA   06/21/2012   Procedure: EXAM UNDER ANESTHESIA;  Surgeon: [name], MD;  Location: [facility];  Service: General;  Laterality: N/A;  rectal exam under anesthesia, anal dilation, rectal biopsy  . [place]   Patient had to guess on the date.  [name] RECTAL BIOPSY  06/21/2012   Procedure: BIOPSY RECTAL;  Surgeon: [name], MD;  Location: [facility];  Service: General;  Laterality: N/A;  . TUBAL LIGATION     b/l  age 59   Social History   Occupational History  . Not on file  Tobacco Use  . Smoking status: Former Smoker    Packs/day: 0.50    Years: 25.00    Pack years: 12.50    Types: Cigarettes    Last attempt to quit: 09/07/1987    Years since quitting: 30.4  . Smokeless tobacco: Never Used  Substance and Sexual Activity  . Alcohol use: No  . Drug use: No  . Sexual activity: Not on file

## 2018-03-15 ENCOUNTER — Encounter (INDEPENDENT_AMBULATORY_CARE_PROVIDER_SITE_OTHER): Payer: Self-pay | Admitting: Orthopedic Surgery

## 2018-03-15 ENCOUNTER — Ambulatory Visit (INDEPENDENT_AMBULATORY_CARE_PROVIDER_SITE_OTHER): Payer: Medicare Other | Admitting: Orthopedic Surgery

## 2018-03-15 DIAGNOSIS — M25511 Pain in right shoulder: Secondary | ICD-10-CM | POA: Diagnosis not present

## 2018-03-15 NOTE — Progress Notes (Signed)
Office Visit Note   [name]           Date of Birth: 12-15-1945           MRN: [id number] Visit Date: 03/15/2018 Requested by: [name], MD [address] [place], [place] [address] PCP: [name], MD  Subjective: Chief Complaint  Patient presents with  . Right Shoulder - Follow-up    HPI: [name] is a patient with right shoulder pain.  Subacromial injection 1 month ago gave her 80% relief.  She would like to have another injection today.  States that full extension hurts her shoulder but otherwise she is doing well.  Takes Tylenol arthritis for her symptoms.              ROS: All systems reviewed are negative as they relate to the chief complaint within the history of present illness.  Patient denies  fevers or chills.   Assessment & Plan: Visit Diagnoses:  1. Right shoulder pain, unspecified chronicity     Plan: Impression is right shoulder bursitis improved with an injection.  I think a second injection is reasonable and we should do that in 2 weeks.  Come back in 2 weeks for planned subacromial injection.  I will see her back at that time.  No evidence on examination today of rotator cuff weakness or tearing.  Decision point today was for or against MRI scanning but based on her response that is not indicated  Follow-Up Instructions: Return in about 2 weeks (around 03/29/2018).   Orders:  No orders of the defined types were placed in this encounter.  No orders of the defined types were placed in this encounter.     Procedures: No procedures performed   Clinical Data: No additional findings.  Objective: Vital Signs: There were no vitals taken for this visit.  Physical Exam:   Constitutional: Patient appears well-developed HEENT:  Head: Normocephalic Eyes:EOM are normal Neck: Normal range of motion Cardiovascular: Normal rate Pulmonary/chest: Effort normal Neurologic: Patient is alert Skin: Skin is warm Psychiatric:  Patient has normal mood and affect    Ortho Exam: Orthopedic exam demonstrates good cervical spine range of motion and good motor sensory function in both arms.  Radial pulse intact.  Patient has good shoulder range of motion with negative impingement signs and no restriction of passive external rotation at 15 degrees of abduction.  No other masses lymph adenopathy or skin changes noted in the shoulder girdle region.  Specialty Comments:  No specialty comments available.  Imaging: No results found.   PMFS History: Patient Active Problem List   Diagnosis Date Noted  . Hemorrhoids, external without complications 51/76/1607  . Depression   . Anxiety   . Rectal bleeding   . History of radiation therapy   . Malignant neoplasm of anal canal ([facility]) 11/15/2011  . Hemorrhoids, internal, with bleeding 09/07/2011  . Acquired anal stenosis 09/07/2011  . Hyperlipidemia 09/07/2011  . Family history of breast cancer in sister 09/07/2011   Past Medical History:  Diagnosis Date  . 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 Anxiety   . Depression   . Full dentures   . Hemorrhoids    external  . History of radiation therapy 11/29/11 to 01/05/12   anal canal 5040 cGy 28 sessions, regional lymph nodes 4200 cGy 28 sessions  . Hyperlipidemia   . Rectal bleeding   . Vitamin D deficiency   . Wears glasses  Family History  Problem Relation Age of Onset  . Cancer Sister        breast/ age 42  . Alcohol abuse Maternal Uncle   . Cervical cancer Sister        52    Past Surgical History:  Procedure Laterality Date  . ABDOMINAL HYSTERECTOMY  1978   age 75  . EXAMINATION UNDER ANESTHESIA  06/21/2012   Procedure: EXAM UNDER ANESTHESIA;  Surgeon: [name], MD;  Location: [facility];  Service: General;  Laterality: N/A;  rectal exam under anesthesia, anal dilation, rectal biopsy  . [place]   Patient had to guess on the date.  [name] RECTAL BIOPSY   06/21/2012   Procedure: BIOPSY RECTAL;  Surgeon: [name], MD;  Location: [facility];  Service: General;  Laterality: N/A;  . TUBAL LIGATION     b/l  age 75   Social History   Occupational History  . Not on file  Tobacco Use  . Smoking status: Former Smoker    Packs/day: 0.50    Years: 25.00    Pack years: 12.50    Types: Cigarettes    Last attempt to quit: 09/07/1987    Years since quitting: 30.5  . Smokeless tobacco: Never Used  Substance and Sexual Activity  . Alcohol use: No  . Drug use: No  . Sexual activity: Not on file

## 2018-03-29 ENCOUNTER — Encounter (INDEPENDENT_AMBULATORY_CARE_PROVIDER_SITE_OTHER): Payer: Self-pay | Admitting: Orthopedic Surgery

## 2018-03-29 ENCOUNTER — Ambulatory Visit (INDEPENDENT_AMBULATORY_CARE_PROVIDER_SITE_OTHER): Payer: Medicare Other | Admitting: Orthopedic Surgery

## 2018-03-29 DIAGNOSIS — M7541 Impingement syndrome of right shoulder: Secondary | ICD-10-CM

## 2018-03-29 MED ORDER — BUPIVACAINE HCL 0.5 % IJ SOLN
9.0000 mL | INTRAMUSCULAR | Status: AC | PRN
  Administered 2018-03-29: 9 mL via INTRA_ARTICULAR

## 2018-03-29 MED ORDER — METHYLPREDNISOLONE ACETATE 40 MG/ML IJ SUSP
40.0000 mg | INTRAMUSCULAR | Status: AC | PRN
Start: 2018-03-29 — End: 2018-03-29
  Administered 2018-03-29: 40 mg via INTRA_ARTICULAR

## 2018-03-29 MED ORDER — LIDOCAINE HCL 1 % IJ SOLN
5.0000 mL | INTRAMUSCULAR | Status: AC | PRN
  Administered 2018-03-29: 5 mL

## 2018-03-29 NOTE — Progress Notes (Signed)
   Procedure Note  [name]             Date of Birth: 03/19/1946           MRN: [id number]             Visit Date: 03/29/2018  Procedures: Visit Diagnoses: Impingement syndrome of right shoulder  Large Joint Inj on 03/29/2018 11:17 AM Indications: diagnostic evaluation and pain Details: 18 G 1.5 in needle, posterior approach  Arthrogram: No  Medications: 9 mL bupivacaine 0.5 %; 40 mg methylPREDNISolone acetate 40 MG/ML; 5 mL lidocaine 1 % Outcome: tolerated well, no immediate complications Procedure, treatment alternatives, risks and benefits explained, specific risks discussed. Consent was given by the patient. Immediately prior to procedure a time out was called to verify the correct patient, procedure, equipment, support staff and site/side marked as required. Patient was prepped and draped in the usual sterile fashion.     Patient presents for second subacromial injection into the right shoulder.  Next step will be MRI scanning if symptoms recur.  She had good relief from first subacromial injection

## 2018-06-28 DIAGNOSIS — H524 Presbyopia: Secondary | ICD-10-CM | POA: Diagnosis not present

## 2018-06-28 DIAGNOSIS — H52223 Regular astigmatism, bilateral: Secondary | ICD-10-CM | POA: Diagnosis not present

## 2018-06-28 DIAGNOSIS — H2513 Age-related nuclear cataract, bilateral: Secondary | ICD-10-CM | POA: Diagnosis not present

## 2018-06-28 DIAGNOSIS — H5203 Hypermetropia, bilateral: Secondary | ICD-10-CM | POA: Diagnosis not present

## 2018-08-30 ENCOUNTER — Emergency Department (HOSPITAL_COMMUNITY)
Admission: EM | Admit: 2018-08-30 | Discharge: 2018-08-30 | Disposition: A | Discharge status: Home / Self Care | Payer: Medicare Other | Attending: Emergency Medicine | Admitting: Emergency Medicine

## 2018-08-30 ENCOUNTER — Encounter (HOSPITAL_COMMUNITY): Payer: Self-pay

## 2018-08-30 DIAGNOSIS — K5900 Constipation, unspecified: Secondary | ICD-10-CM

## 2018-08-30 DIAGNOSIS — Z79899 Other long term (current) drug therapy: Secondary | ICD-10-CM | POA: Diagnosis not present

## 2018-08-30 DIAGNOSIS — C218 Malignant neoplasm of overlapping sites of rectum, anus and anal canal: Secondary | ICD-10-CM | POA: Insufficient documentation

## 2018-08-30 MED ORDER — FLEET ENEMA 7-19 GM/118ML RE ENEM
1.0000 | ENEMA | Freq: Once | RECTAL | Status: AC
  Administered 2018-08-30: 1 via RECTAL
  Filled 2018-08-30: qty 1

## 2018-08-30 MED ORDER — FLEET ENEMA 7-19 GM/118ML RE ENEM: 1.0000 | ENEMA | Freq: Every day | RECTAL | 1 refills | Status: DC | PRN

## 2018-08-30 MED ORDER — POLYETHYLENE GLYCOL 3350 17 G PO PACK: 17.0000 g | PACK | Freq: Two times a day (BID) | ORAL | 0 refills | Status: AC

## 2018-08-30 NOTE — ED Provider Notes (Signed)
[facility] EMERGENCY DEPARTMENT Provider Note   CSN: [id number] Arrival date & time: 08/30/18  3833     History   Chief Complaint Chief Complaint  Patient presents with  . Constipation    HPI [name] is a 72 y.o. female.  HPI Patient presents to the emergency room for evaluation of constipation.  Patient says she has not had a good bowel movement for at least the last 4 days.  She has been trying laxatives without relief.  She feels like she has stool in the rectum and has to go to the bathroom but is unable to do so.  Denies any trouble with abdominal pain.  She has no nausea or vomiting.  She does have a prior history of anal cancer that is currently in remission. Past Medical History:  Diagnosis Date  . 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 Anxiety   . Depression   . Full dentures   . Hemorrhoids    external  . History of radiation therapy 11/29/11 to 01/05/12   anal canal 5040 cGy 28 sessions, regional lymph nodes 4200 cGy 28 sessions  . Hyperlipidemia   . Rectal bleeding   . Vitamin D deficiency   . Wears glasses     Patient Active Problem List   Diagnosis Date Noted  . Hemorrhoids, external without complications 38/32/9191  . Depression   . Anxiety   . Rectal bleeding   . History of radiation therapy   . Malignant neoplasm of anal canal ([place]) 11/15/2011  . Hemorrhoids, internal, with bleeding 09/07/2011  . Acquired anal stenosis 09/07/2011  . Hyperlipidemia 09/07/2011  . Family history of breast cancer in sister 09/07/2011    Past Surgical History:  Procedure Laterality Date  . ABDOMINAL HYSTERECTOMY  1978   age 60  . EXAMINATION UNDER ANESTHESIA  06/21/2012   Procedure: EXAM UNDER ANESTHESIA;  Surgeon: [name], MD;  Location: [facility];  Service: General;  Laterality: N/A;  rectal exam under anesthesia, anal dilation, rectal biopsy  . [place]   Patient had to guess on the  date.  [name] RECTAL BIOPSY  06/21/2012   Procedure: BIOPSY RECTAL;  Surgeon: [name], MD;  Location: [facility];  Service: General;  Laterality: N/A;  . TUBAL LIGATION     b/l  age 36     OB History    Gravida  1   Para  1   Term      Preterm      AB      Living        SAB      TAB      Ectopic      Multiple      Live Births               Home Medications    Prior to Admission medications   Medication Sig Start Date End Date Taking? Authorizing Provider  aspirin EC 81 MG tablet Take 81 mg by mouth daily.    [provider]  Calcium Carb-Cholecalciferol (CALTRATE 600+D3 SOFT PO) Take by mouth.    [provider]  calcium carbonate (TUMS - DOSED IN MG ELEMENTAL CALCIUM) 500 MG chewable tablet Chew 1 tablet by mouth 2 (two) times daily.    [provider]  cholecalciferol (VITAMIN D) 1000 UNITS tablet Take 1,000 Units by mouth daily.    [provider]  cyanocobalamin 500 MCG tablet Take 500 mcg by mouth daily.    [provider]  DULoxetine (CYMBALTA) 60 MG capsule Take 60 mg by mouth daily.    [provider]  folic acid (FOLVITE) 093 MCG tablet Take 400 mcg by mouth daily.    [provider]  gabapentin (NEURONTIN) 600 MG tablet Take 600 mg by mouth 2 (two) times daily. 07/14/16   [provider]  Garlic 2355 MG CAPS Take by mouth daily.    [provider]  Multiple Vitamins-Minerals (CENTRUM SILVER ULTRA WOMENS PO) Take by mouth.    [provider]  Omega-3 Fatty Acids (FISH OIL) 1000 MG CAPS Take by mouth.    [provider]  polyethylene glycol (MIRALAX) packet Take 17 g by mouth 2 (two) times daily for 7 days. 08/30/18 09/06/18  Dorie Rank, MD  pravastatin (PRAVACHOL) 40 MG tablet Take 40 mg by mouth daily with breakfast.     [provider]  REXULTI 1 MG TABS Take 1 mg by mouth daily. 08/03/16   [provider]  sodium  phosphate (FLEET) 7-19 GM/118ML ENEM Place 133 mLs (1 enema total) rectally daily as needed for severe constipation. 08/30/18   Dorie Rank, MD  traZODone (DESYREL) 100 MG tablet Take 200 mg by mouth at bedtime.  07/14/16   [provider]  vitamin C (ASCORBIC ACID) 500 MG tablet Take 500 mg by mouth daily.    [provider]  Wheat Dextrin (BENEFIBER DRINK MIX PO) Take by mouth. Reported on 12/08/2015    [provider]    Family History Family History  Problem Relation Age of Onset  . Cancer Sister        breast/ age 12  . Cervical cancer Sister        58  . Alcohol abuse Maternal Uncle     Social History Social History   Tobacco Use  . Smoking status: Former Smoker    Packs/day: 0.50    Years: 25.00    Pack years: 12.50    Types: Cigarettes    Last attempt to quit: 09/07/1987    Years since quitting: 31.0  . Smokeless tobacco: Never Used  Substance Use Topics  . Alcohol use: No  . Drug use: No     Allergies   Patient has no known allergies.   Review of Systems Review of Systems  All other systems reviewed and are negative.    Physical Exam Updated Vital Signs BP (!) 155/76 (BP Location: Right Arm)   Pulse 95   Temp 98.9 F (37.2 C) (Oral)   Resp 20   SpO2 97%   Physical Exam  Constitutional: She appears well-developed and well-nourished. No distress.  HENT:  Head: Normocephalic and atraumatic.  Right Ear: External ear normal.  Left Ear: External ear normal.  Eyes: Conjunctivae are normal. Right eye exhibits no discharge. Left eye exhibits no discharge. No scleral icterus.  Neck: Neck supple. No tracheal deviation present.  Cardiovascular: Normal rate, regular rhythm and intact distal pulses.  Pulmonary/Chest: Effort normal and breath sounds normal. No stridor. No respiratory distress. She has no wheezes. She has no rales.  Abdominal: Soft. Bowel sounds are normal. She exhibits no distension. There is no tenderness. There is no  rebound and no guarding.  Genitourinary: Rectal exam shows external hemorrhoid. Rectal exam shows no mass and no tenderness.  Genitourinary Comments: S/p anal ca treatment, able to do digital rectal exam with limitation, soft stool in the  rectal vault, unable to do disimpaction because of the anal restriction  Musculoskeletal: She exhibits no edema or tenderness.  Neurological: She is alert. She has normal strength. No cranial nerve deficit (no facial droop, extraocular movements intact, no slurred speech) or sensory deficit. She exhibits normal muscle tone. She displays no seizure activity. Coordination normal.  Skin: Skin is warm and dry. No rash noted.  Psychiatric: She has a normal mood and affect.  Nursing note and vitals reviewed.    ED Treatments / Results  Labs (all labs ordered are listed, but only abnormal results are displayed) Labs Reviewed - No data to display  EKG None  Radiology No results found.  Procedures Procedures (including critical care time)  Medications Ordered in ED Medications  sodium phosphate (FLEET) 7-19 GM/118ML enema 1 enema (1 enema Rectal Given 08/30/18 0734)     Initial Impression / Assessment and Plan / ED Course  I have reviewed the triage vital signs and the nursing notes.  Pertinent labs & imaging results that were available during my care of the patient were reviewed by me and considered in my medical decision making (see chart for details).   Patient presents to the emergency room for evaluation of constipation.  She does have a history of anal cancer which has left her with some scarring in that area.  Patient is not having abdominal pain.  No signs of infections.  She was given a laxative here in the emergency room and had some partial relief.  I will discharge her home with prescriptions for MiraLAX and a Fleet enema.  Patient would like to continue her treatment at home.  Final Clinical Impressions(s) / ED Diagnoses   Final  diagnoses:  Constipation, unspecified constipation type    ED Discharge Orders         Ordered    polyethylene glycol (MIRALAX) packet  2 times daily     08/30/18 0806    sodium phosphate (FLEET) 7-19 GM/118ML ENEM  Daily PRN     08/30/18 0806           Dorie Rank, MD 08/30/18 [phone]

## 2018-08-30 NOTE — Discharge Instructions (Signed)
Drink plenty of fluids, take the laxatives as prescribed, take the enemas as well

## 2018-08-30 NOTE — ED Triage Notes (Signed)
Pt reports constipation for the past four days unrelieved by laxatives. Pt states she has a hx of rectal cancer, denies abd pain or nausea

## 2018-08-31 ENCOUNTER — Other Ambulatory Visit: Payer: Self-pay

## 2018-08-31 ENCOUNTER — Encounter (HOSPITAL_COMMUNITY): Payer: Self-pay | Admitting: Emergency Medicine

## 2018-08-31 ENCOUNTER — Emergency Department (HOSPITAL_COMMUNITY)
Admission: EM | Admit: 2018-08-31 | Discharge: 2018-08-31 | Disposition: A | Discharge status: Home / Self Care | Payer: Medicare Other | Attending: Emergency Medicine | Admitting: Emergency Medicine

## 2018-08-31 DIAGNOSIS — K5900 Constipation, unspecified: Secondary | ICD-10-CM

## 2018-08-31 DIAGNOSIS — Z7982 Long term (current) use of aspirin: Secondary | ICD-10-CM | POA: Diagnosis not present

## 2018-08-31 DIAGNOSIS — Z85040 Personal history of malignant carcinoid tumor of rectum: Secondary | ICD-10-CM | POA: Insufficient documentation

## 2018-08-31 DIAGNOSIS — Z79899 Other long term (current) drug therapy: Secondary | ICD-10-CM | POA: Insufficient documentation

## 2018-08-31 DIAGNOSIS — Z87891 Personal history of nicotine dependence: Secondary | ICD-10-CM | POA: Diagnosis not present

## 2018-08-31 MED ORDER — POLYETHYLENE GLYCOL 3350 17 G PO PACK
34.0000 g | PACK | Freq: Every day | ORAL | Status: DC
  Administered 2018-08-31: 34 g via ORAL
  Filled 2018-08-31: qty 2

## 2018-08-31 NOTE — ED Triage Notes (Signed)
Pt reports being seen yesterday for constipation. Was given an enema and gave herself another one when she got home with no success.

## 2018-08-31 NOTE — ED Notes (Signed)
Patient verbalizes understanding of discharge instructions. Opportunity for questioning and answers were provided. Pt discharged from ED. 

## 2018-08-31 NOTE — Discharge Instructions (Addendum)
Go to the drug store and purchase a bottle of miralax. Mix 4 doses into a 32 oz bottle of sports drink like Gatorade. Drink this over the course of a few hours or until you have a good bowel movement. You may repeat once more if needed.

## 2018-08-31 NOTE — ED Provider Notes (Signed)
[facility] EMERGENCY DEPARTMENT Provider Note   CSN: [id number] Arrival date & time: 08/31/18  3295     History   Chief Complaint Chief Complaint  Patient presents with  . Constipation    HPI [name] is a 72 y.o. female.  HPI  72 year old female with constipation.  She reports that her last bowel movement was about 5 days ago.  Denies any abdominal pain but does have some occasional cramping and discomfort.  She was seen in the emergency room yesterday for the same.  She was prescribed MiraLAX and fleets enema which she is tried without result.  Past history of rectal cancer treated with radiation and chemotherapy several years ago.  She denies any blood in her stool or melena.  DRE yesterday does not sound like she had an obvious impaction.  Past Medical History:  Diagnosis Date  . 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 Anxiety   . Depression   . Full dentures   . Hemorrhoids    external  . History of radiation therapy 11/29/11 to 01/05/12   anal canal 5040 cGy 28 sessions, regional lymph nodes 4200 cGy 28 sessions  . Hyperlipidemia   . Rectal bleeding   . Vitamin D deficiency   . Wears glasses     Patient Active Problem List   Diagnosis Date Noted  . Hemorrhoids, external without complications 18/84/1660  . Depression   . Anxiety   . Rectal bleeding   . History of radiation therapy   . Malignant neoplasm of anal canal ([place]) 11/15/2011  . Hemorrhoids, internal, with bleeding 09/07/2011  . Acquired anal stenosis 09/07/2011  . Hyperlipidemia 09/07/2011  . Family history of breast cancer in sister 09/07/2011    Past Surgical History:  Procedure Laterality Date  . ABDOMINAL HYSTERECTOMY  1978   age 60  . EXAMINATION UNDER ANESTHESIA  06/21/2012   Procedure: EXAM UNDER ANESTHESIA;  Surgeon: [name], MD;  Location: [facility];  Service: General;  Laterality: N/A;  rectal exam under anesthesia, anal  dilation, rectal biopsy  . [place]   Patient had to guess on the date.  [name] RECTAL BIOPSY  06/21/2012   Procedure: BIOPSY RECTAL;  Surgeon: [name], MD;  Location: [facility];  Service: General;  Laterality: N/A;  . TUBAL LIGATION     b/l  age 24     OB History    Gravida  1   Para  1   Term      Preterm      AB      Living        SAB      TAB      Ectopic      Multiple      Live Births               Home Medications    Prior to Admission medications   Medication Sig Start Date End Date Taking? Authorizing Provider  aspirin EC 81 MG tablet Take 81 mg by mouth daily.    [provider]  Calcium Carb-Cholecalciferol (CALTRATE 600+D3 SOFT PO) Take by mouth.    [provider]  calcium carbonate (TUMS - DOSED IN MG ELEMENTAL CALCIUM) 500 MG chewable tablet Chew 1 tablet by mouth 2 (two) times daily.    [provider]  cholecalciferol (VITAMIN D) 1000 UNITS tablet Take 1,000 Units by mouth daily.  [provider]  cyanocobalamin 500 MCG tablet Take 500 mcg by mouth daily.    [provider]  DULoxetine (CYMBALTA) 60 MG capsule Take 60 mg by mouth daily.    [provider]  folic acid (FOLVITE) 762 MCG tablet Take 400 mcg by mouth daily.    [provider]  gabapentin (NEURONTIN) 600 MG tablet Take 600 mg by mouth 2 (two) times daily. 07/14/16   [provider]  Garlic 8315 MG CAPS Take by mouth daily.    [provider]  Multiple Vitamins-Minerals (CENTRUM SILVER ULTRA WOMENS PO) Take by mouth.    [provider]  Omega-3 Fatty Acids (FISH OIL) 1000 MG CAPS Take by mouth.    [provider]  polyethylene glycol (MIRALAX) packet Take 17 g by mouth 2 (two) times daily for 7 days. 08/30/18 09/06/18  Dorie Rank, MD  pravastatin (PRAVACHOL) 40 MG tablet Take 40 mg by mouth daily with breakfast.     [provider]  REXULTI 1  MG TABS Take 1 mg by mouth daily. 08/03/16   [provider]  sodium phosphate (FLEET) 7-19 GM/118ML ENEM Place 133 mLs (1 enema total) rectally daily as needed for severe constipation. 08/30/18   Dorie Rank, MD  traZODone (DESYREL) 100 MG tablet Take 200 mg by mouth at bedtime.  07/14/16   [provider]  vitamin C (ASCORBIC ACID) 500 MG tablet Take 500 mg by mouth daily.    [provider]  Wheat Dextrin (BENEFIBER DRINK MIX PO) Take by mouth. Reported on 12/08/2015    [provider]    Family History Family History  Problem Relation Age of Onset  . Cancer Sister        breast/ age 69  . Cervical cancer Sister        43  . Alcohol abuse Maternal Uncle     Social History Social History   Tobacco Use  . Smoking status: Former Smoker    Packs/day: 0.50    Years: 25.00    Pack years: 12.50    Types: Cigarettes    Last attempt to quit: 09/07/1987    Years since quitting: 31.0  . Smokeless tobacco: Never Used  Substance Use Topics  . Alcohol use: No  . Drug use: No     Allergies   Patient has no known allergies.   Review of Systems Review of Systems  All systems reviewed and negative, other than as noted in HPI.  Physical Exam Updated Vital Signs BP 140/87 (BP Location: Right Arm)   Pulse (!) 102   Temp 98.3 F (36.8 C) (Oral)   Resp 17   SpO2 96%   Physical Exam  Constitutional: She appears well-developed and well-nourished. No distress.  HENT:  Head: Normocephalic and atraumatic.  Eyes: Conjunctivae are normal. Right eye exhibits no discharge. Left eye exhibits no discharge.  Neck: Neck supple.  Cardiovascular: Normal rate, regular rhythm and normal heart sounds. Exam reveals no gallop and no friction rub.  No murmur heard. Pulmonary/Chest: Effort normal and breath sounds normal. No respiratory distress.  Abdominal: Soft. She exhibits no distension. There is no tenderness.  Musculoskeletal: She exhibits no edema or  tenderness.  Neurological: She is alert.  Skin: Skin is warm and dry.  Psychiatric: She has a normal mood and affect. Her behavior is normal. Thought content normal.  Nursing note and vitals reviewed.    ED Treatments / Results  Labs (all labs ordered are listed, but  only abnormal results are displayed) Labs Reviewed - No data to display  EKG None  Radiology No results found.  Procedures Procedures (including critical care time)  Medications Ordered in ED Medications  polyethylene glycol (MIRALAX / GLYCOLAX) packet 34 g (34 g Oral Given 08/31/18 0813)     Initial Impression / Assessment and Plan / ED Course  I have reviewed the triage vital signs and the nursing notes.  Pertinent labs & imaging results that were available during my care of the patient were reviewed by me and considered in my medical decision making (see chart for details).     72 year old female with symptoms of constipation.  Will start her on a more aggressive bowel regimen.  No blood in her stool or any rectal pain but she has a past history of rectal cancer. She says being constipated is unusual for her and I think it would be prudent to follow-up with GI if she has persistent symptoms.   Final Clinical Impressions(s) / ED Diagnoses   Final diagnoses:  Constipation, unspecified constipation type    ED Discharge Orders    None       Virgel Manifold, MD 09/01/18 1459

## 2018-08-31 NOTE — ED Notes (Signed)
Pt state she doesn't feel as uncomfortable as she was prior to enema administration

## 2018-09-04 ENCOUNTER — Telehealth: Payer: Self-pay | Admitting: *Deleted

## 2018-09-04 NOTE — Telephone Encounter (Signed)
Notified patient per Dr. Benay Spice to keep appointment w/GI-may have rectal/anal stricture and they are the best to do rectal exam and can dilate if needed. Suggested she call office and request to be on waiting list in case of a cancellation. She appreciated the return call.

## 2018-09-04 NOTE — Telephone Encounter (Signed)
Patient reports "constipation" and went to ER on 10/31 for no BM in 4 days. Was given enema and sent home. Drinking Miralax daily with Senokot #2/day and fleet enema 1-2/day. Reports abdominal cramping as well. Has had BM daily but it is very loose and she is cramping most of the day. Dr. Orest Dikes (PCP) has no openings this week. She does have an appointment on 11/8 with [facility]. Asking if Dr. Benay Spice could see her? Informed patient that the GI office is the best to follow this issue. Suggested she call her PCP office again to inquire if NP or PA could see her instead. Will relay the message to Dr. Benay Spice as well.

## 2018-11-23 ENCOUNTER — Inpatient Hospital Stay: Payer: Medicare Other | Attending: Oncology | Admitting: Oncology

## 2018-11-23 ENCOUNTER — Telehealth: Payer: Self-pay | Admitting: Oncology

## 2018-11-23 VITALS — BP 143/67 | HR 72 | Temp 97.8°F | Resp 18 | Wt 169.9 lb

## 2018-11-23 DIAGNOSIS — Z9221 Personal history of antineoplastic chemotherapy: Secondary | ICD-10-CM | POA: Diagnosis not present

## 2018-11-23 DIAGNOSIS — F329 Major depressive disorder, single episode, unspecified: Secondary | ICD-10-CM | POA: Insufficient documentation

## 2018-11-23 DIAGNOSIS — Z85048 Personal history of other malignant neoplasm of rectum, rectosigmoid junction, and anus: Secondary | ICD-10-CM | POA: Insufficient documentation

## 2018-11-23 DIAGNOSIS — C801 Malignant (primary) neoplasm, unspecified: Secondary | ICD-10-CM

## 2018-11-23 DIAGNOSIS — Z923 Personal history of irradiation: Secondary | ICD-10-CM | POA: Diagnosis not present

## 2018-11-23 NOTE — Progress Notes (Signed)
  [name] OFFICE PROGRESS NOTE   Diagnosis: Anal cancer  INTERVAL HISTORY:   [name] turns as scheduled.  She feels well.  No difficulty with bowel movements.  No rectal pain or bleeding.  Good appetite.  She is dieting.   Objective:  Vital signs in last 24 hours:  Blood pressure (!) 143/67, pulse 72, temperature 97.8 F (36.6 C), temperature source Oral, resp. rate 18, weight 169 lb 14.4 oz (77.1 kg), SpO2 100 %.    HEENT: Neck without mass Lymphatics: No cervical, supraclavicular, axillary, or inguinal nodes Resp: Lungs clear bilaterally Cardio: Regular rate and rhythm GI: No hepatomegaly, no mass, nontender Rectal: Radiation changes at the perineum, hemorrhoid at the left inferior anal verge, anal stenosis, I was able to pass the stenosis with my fifth finger, scarring in the anal canal, no mass Vascular: No leg edema   Medications: I have reviewed the patient's current medications.   Assessment/Plan: 1. Poorly differentiated carcinoma, basaloid squamous cell carcinoma, of the anal canal and posterior vagina. Staging CT scans of the abdomen and pelvis on 11/15/2011 with suspicious perirectal lymph node and no evidence of distant metastatic disease. She began radiation 11/29/2011. She completed cycle 1 mitomycin and 5 fluorouracil 11/29/2011. She completed cycle 2 beginning 12/27/2011. She completed the course of radiation on 01/05/2012. 2. Rectal bleeding secondary to #1. Resolved. 3. Erythema and desquamation related to radiation. Resolved. 4. Anemia. Resolved. 5. Status post placement right upper the PICC line 11/29/2011. The PICC line has been removed. 6. Depression.  7. Status post evaluation in the emergency Department on 02/09/2012 for shortness of breath. Chest CT was negative for evidence of a pulmonary embolism or other acute finding. She has had no further shortness of breath. 8. Rectal stenosis. She is status post an exam under anesthesia  with dilatation of the anal canal and biopsies of the posterior vaginal wall and anterior anal wall canal with pathology negative for evidence of malignancy 06/21/2012. Persistent rectal stenosis. She has been evaluated by Dr. Dalbert Batman 9. Hemorrhoids 10. Colonoscopy 08/11/2016-anal stenosis.  Internal hemorrhoids.  Repeat colonoscopy in 3 years for surveillance.    Disposition: She remains in clinical remission from anal cancer.  She will be scheduled for a colonoscopy with Dr. Michail Sermon this year.  She would like to continue follow-up at the Cancer center.  [name] will return for an office visit in 1 year.  Betsy Coder, MD  11/23/2018  8:03 AM

## 2018-11-23 NOTE — Telephone Encounter (Signed)
Scheduled appt per 01/23 los. ° °Printed calendar and avs. °

## 2018-11-23 NOTE — Patient Instructions (Signed)
Please provide copy of your Advanced Directive/Living Will at your next appointment to scan into chart.

## 2019-01-17 ENCOUNTER — Ambulatory Visit (INDEPENDENT_AMBULATORY_CARE_PROVIDER_SITE_OTHER): Payer: Medicare Other | Admitting: Orthopedic Surgery

## 2019-01-17 ENCOUNTER — Emergency Department (HOSPITAL_COMMUNITY)
Admission: EM | Admit: 2019-01-17 | Discharge: 2019-01-17 | Disposition: A | Discharge status: Home / Self Care | Payer: Medicare Other | Attending: Emergency Medicine | Admitting: Emergency Medicine

## 2019-01-17 ENCOUNTER — Encounter (HOSPITAL_COMMUNITY): Payer: Self-pay

## 2019-01-17 ENCOUNTER — Emergency Department (HOSPITAL_COMMUNITY): Payer: Medicare Other

## 2019-01-17 ENCOUNTER — Other Ambulatory Visit: Payer: Self-pay

## 2019-01-17 DIAGNOSIS — K5641 Fecal impaction: Secondary | ICD-10-CM | POA: Diagnosis not present

## 2019-01-17 DIAGNOSIS — Z79899 Other long term (current) drug therapy: Secondary | ICD-10-CM | POA: Insufficient documentation

## 2019-01-17 DIAGNOSIS — F17210 Nicotine dependence, cigarettes, uncomplicated: Secondary | ICD-10-CM | POA: Insufficient documentation

## 2019-01-17 DIAGNOSIS — Z7982 Long term (current) use of aspirin: Secondary | ICD-10-CM | POA: Diagnosis not present

## 2019-01-17 DIAGNOSIS — K5900 Constipation, unspecified: Secondary | ICD-10-CM | POA: Diagnosis present

## 2019-01-17 LAB — BASIC METABOLIC PANEL
ANION GAP: 9 (ref 5–15)
BUN: 11 mg/dL (ref 8–23)
CALCIUM: 8.9 mg/dL (ref 8.9–10.3)
CO2: 29 mmol/L (ref 22–32)
Chloride: 101 mmol/L (ref 98–111)
Creatinine, Ser: 0.74 mg/dL (ref 0.44–1.00)
GFR calc Af Amer: >60 mL/min (ref >60)
GLUCOSE: 133 mg/dL — AB (ref 70–99)
Potassium: 3.4 mmol/L — ABNORMAL LOW (ref 3.5–5.1)
Sodium: 139 mmol/L (ref 135–145)

## 2019-01-17 LAB — CBC WITH DIFFERENTIAL/PLATELET
Abs Immature Granulocytes: 0.02 10*3/uL (ref 0.00–0.07)
BASOS PCT: 0 %
Basophils Absolute: 0 10*3/uL (ref 0.0–0.1)
EOS ABS: 0 10*3/uL (ref 0.0–0.5)
EOS PCT: 0 %
HEMATOCRIT: 34.6 % — AB (ref 36.0–46.0)
HEMOGLOBIN: 11.6 g/dL — AB (ref 12.0–15.0)
Immature Granulocytes: 0 %
LYMPHS ABS: 0.9 10*3/uL (ref 0.7–4.0)
Lymphocytes Relative: 13 %
MCH: 33 pg (ref 26.0–34.0)
MCHC: 33.5 g/dL (ref 30.0–36.0)
MCV: 98.3 fL (ref 80.0–100.0)
MONO ABS: 0.6 10*3/uL (ref 0.1–1.0)
MONOS PCT: 10 %
NEUTROS PCT: 77 %
Neutro Abs: 5 10*3/uL (ref 1.7–7.7)
Platelets: 204 10*3/uL (ref 150–400)
RBC: 3.52 MIL/uL — ABNORMAL LOW (ref 3.87–5.11)
RDW: 13 % (ref 11.5–15.5)
WBC: 6.5 10*3/uL (ref 4.0–10.5)
nRBC: 0 % (ref 0.0–0.2)

## 2019-01-17 IMAGING — CT CT ABDOMEN AND PELVIS WITH CONTRAST
2 of 5 series · 16 of 46 positions shown, 18 images · IV contrast (Omni 300)
Comparison: CT of the abdomen and pelvis on [DATE]

CLINICAL DATA: Constipation and history of anal squamous carcinoma.
History of rectal stenosis after treatment.

EXAM:
CT ABDOMEN AND PELVIS WITH CONTRAST
TECHNIQUE: Multidetector CT imaging of the abdomen and pelvis was performed
using the standard protocol following bolus administration of
intravenous contrast.
CONTRAST:  100mL [DY] IOPAMIDOL ([DY]) INJECTION 61%

[Series 3: a/p w/ 5mm · axial · 0.84mm/px · z∈[+628,+1068]mm · 13 of 100 slices shown, 15 images]
[im 6/100  soft-tissue]
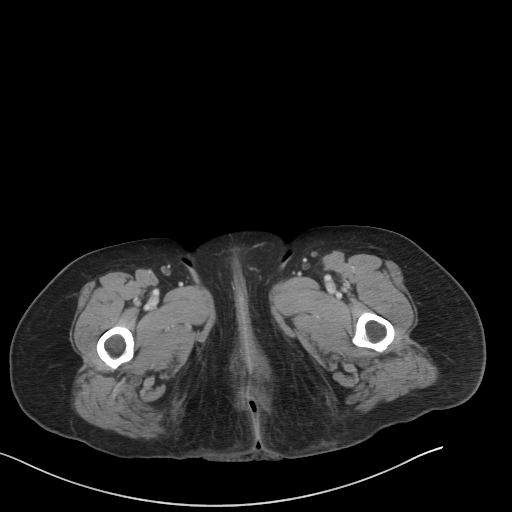
[im 6/100  bone]
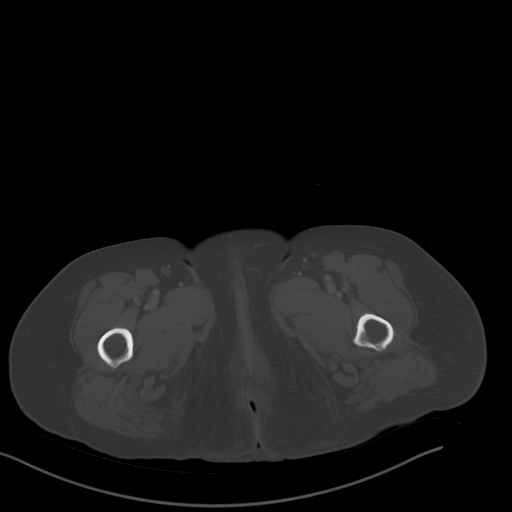
[im 16/100  soft-tissue]
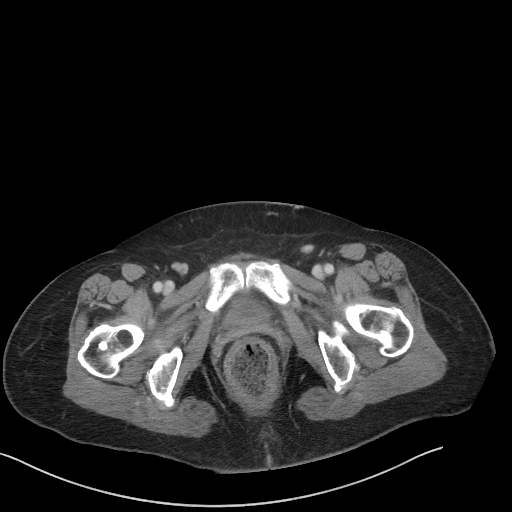
[im 21/100  soft-tissue]
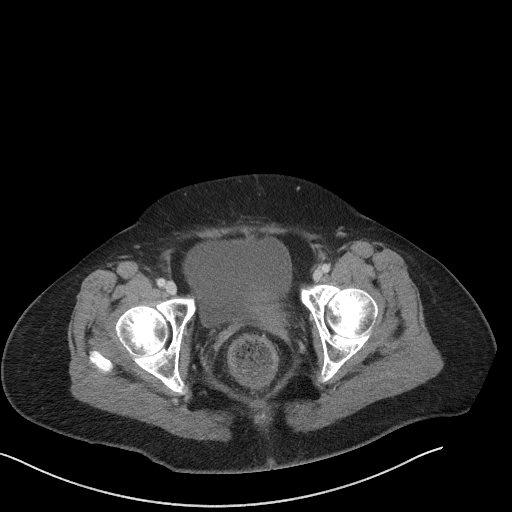
[im 27/100  soft-tissue]
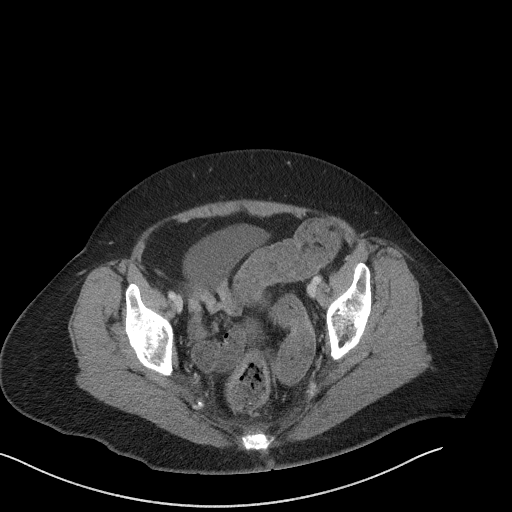
[im 37/100  soft-tissue]
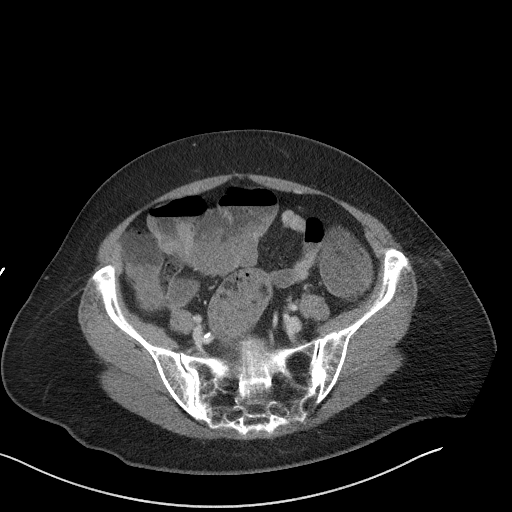
[im 42/100  soft-tissue]
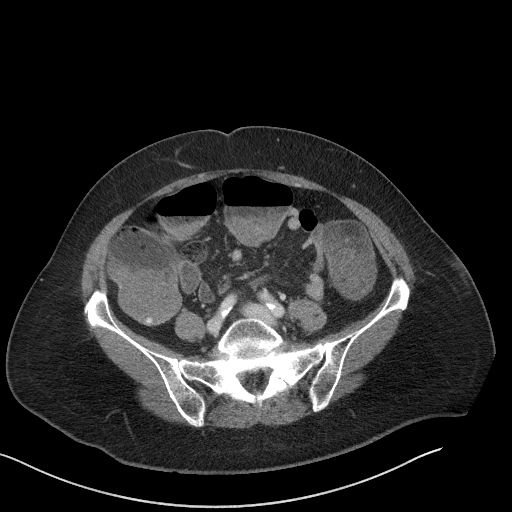
[im 53/100  soft-tissue]
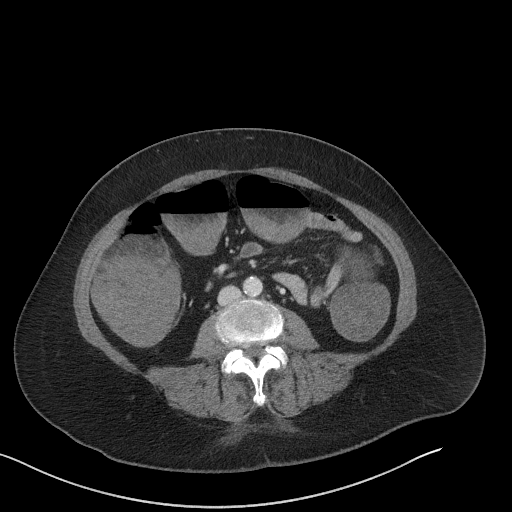
[im 58/100  soft-tissue]
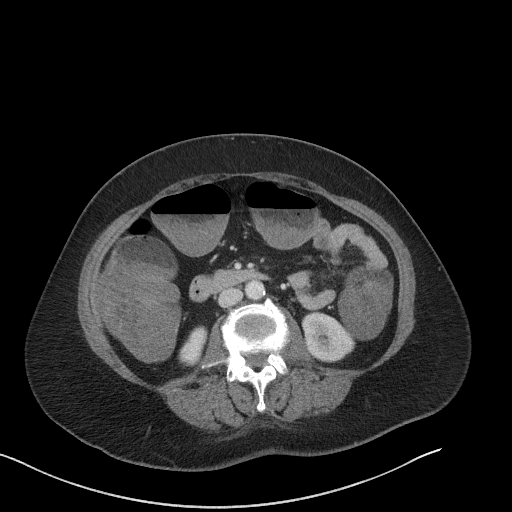
[im 63/100  soft-tissue]
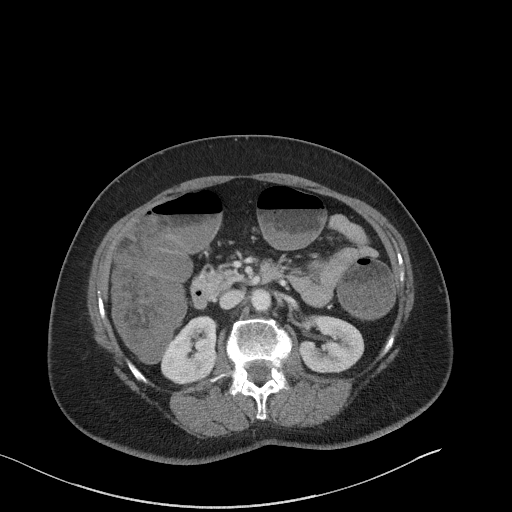
[im 63/100  bone]
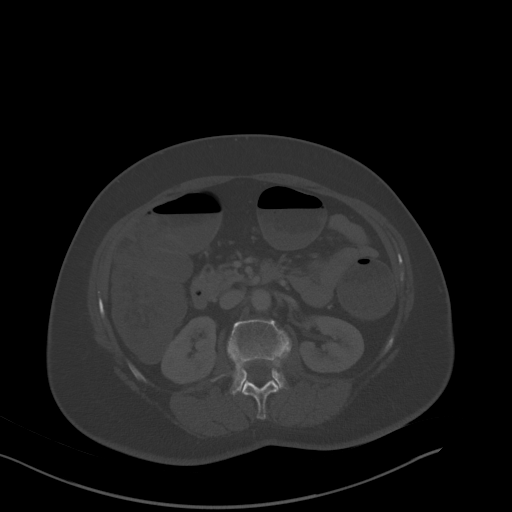
[im 73/100  soft-tissue]
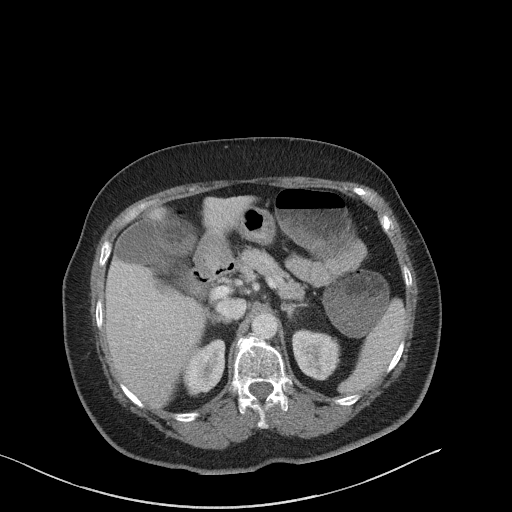
[im 79/100  soft-tissue]
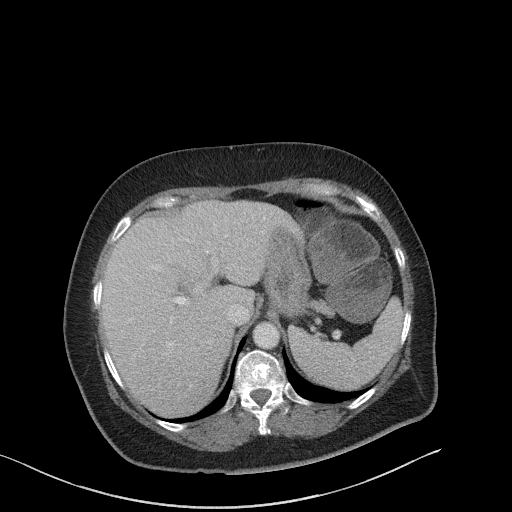
[im 84/100  soft-tissue]
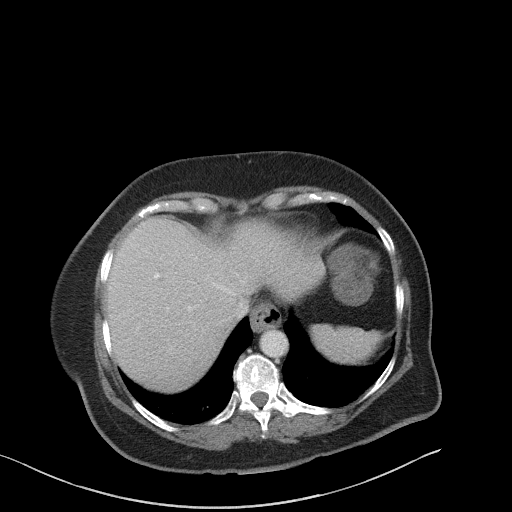
[im 94/100  soft-tissue]
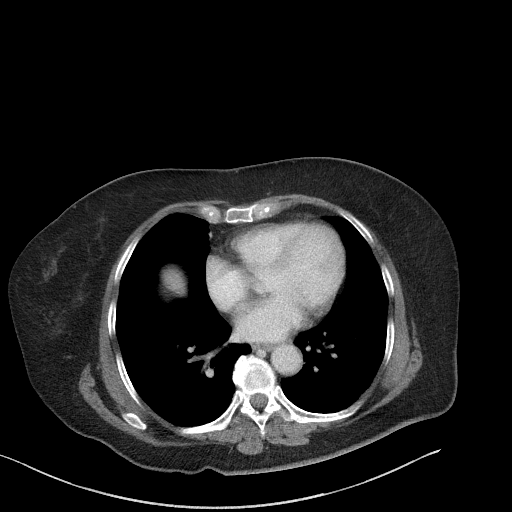

[Series 6: a/p w/ cor · coronal · 0.96mm/px · 3 of 173 slices shown]
[im 58/173  soft-tissue]
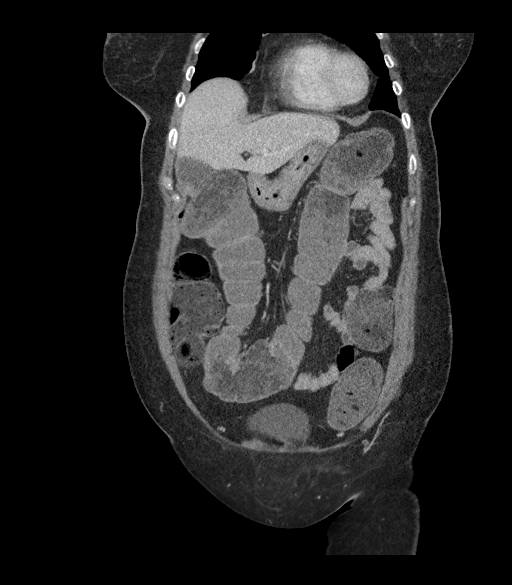
[im 77/173  soft-tissue]
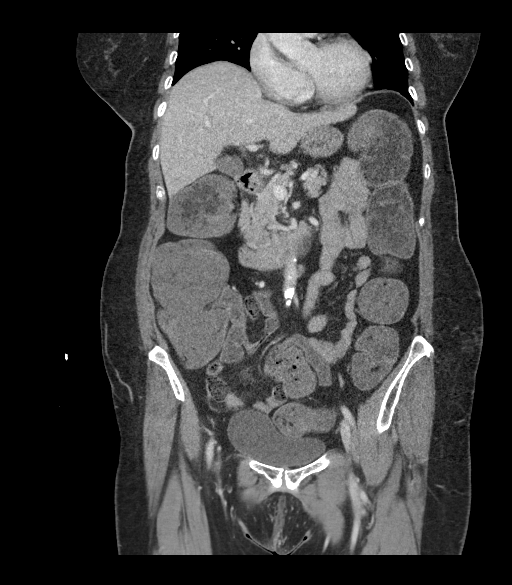
[im 96/173  soft-tissue]
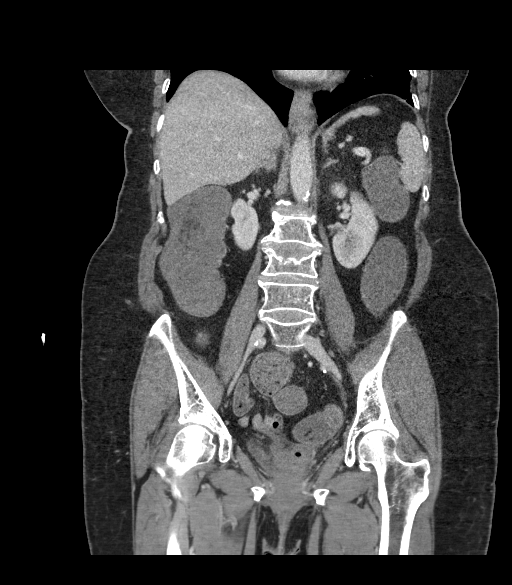

[16 of 46 positions shown; findings below may reference images not displayed]

FINDINGS: Lower chest: No acute abnormality.

Hepatobiliary: No focal liver abnormality is seen. No gallstones,
gallbladder wall thickening, or biliary dilatation.

Pancreas: Unremarkable. No pancreatic ductal dilatation or
surrounding inflammatory changes.

Spleen: Normal in size without focal abnormality.

Adrenals/Urinary Tract: Adrenal glands are unremarkable.
Nonobstructing 5 mm calculus in the lower pole collecting system of
the right kidney. No hydronephrosis. No renal masses. Bladder is
unremarkable.

Stomach/Bowel: There is diffuse dilatation of the entire colon which
is filled with liquid and stool all the way to the level of the
distal rectum/anus. The ascending colon measures up to 7.4 cm in
diameter. Findings are likely secondary to distal rectal/anal
obstruction given the history of prior treated anal carcinoma with
history of post treatment stricture. There is some associated
fecalization bowel contents in the terminal ileum with mild
dilatation of the distal ileum up to 3 cm in caliber. More proximal
small bowel shows no dilatation. No evidence of free air or
pneumatosis. No focal abscess identified.

Vascular/Lymphatic: No significant vascular findings are present. No
enlarged abdominal or pelvic lymph nodes.

Reproductive: Status post hysterectomy. No adnexal masses.

Other: No abdominal wall hernia or abnormality. No abdominopelvic
ascites.

Musculoskeletal: Stable osteopenia and chronic compression
deformities of the L1, L3 and L5 vertebral bodies which appears
stable since [DY].
IMPRESSION: Evidence of colonic obstruction with diffuse dilatation of the
entire colon extending into the distal ileum. Level of obstruction
is likely at the distal rectum/anus given history of anal carcinoma
with post treatment stricture. No evidence of bowel perforation.

## 2019-01-17 MED ORDER — BISACODYL 10 MG RE SUPP: 10.0000 mg | RECTAL | 0 refills | Status: DC | PRN

## 2019-01-17 MED ORDER — FLEET ENEMA 7-19 GM/118ML RE ENEM: 1.0000 | ENEMA | Freq: Once | RECTAL | Status: DC

## 2019-01-17 MED ORDER — DOCUSATE SODIUM 100 MG PO CAPS: 100.0000 mg | ORAL_CAPSULE | Freq: Two times a day (BID) | ORAL | 0 refills | Status: DC

## 2019-01-17 MED ORDER — IOPAMIDOL (ISOVUE-300) INJECTION 61%
100.0000 mL | Freq: Once | INTRAVENOUS | Status: AC | PRN
  Administered 2019-01-17: 100 mL via INTRAVENOUS

## 2019-01-17 MED ORDER — POLYETHYLENE GLYCOL 3350 17 G PO PACK: 17.0000 g | PACK | Freq: Every day | ORAL | 0 refills | Status: DC

## 2019-01-17 NOTE — Discharge Instructions (Signed)
Take the following medications to help with your constipation.  You will need to follow a clear liquid diet until your distention improves. Follow-up with your primary care provider. Return to the ED for worsening symptoms, increased distention, fever, severe abdominal pain or blood in your stool.

## 2019-01-17 NOTE — ED Notes (Signed)
Transported to CT 

## 2019-01-17 NOTE — ED Provider Notes (Signed)
[facility] EMERGENCY DEPARTMENT Provider Note   CSN: [id number] Arrival date & time: 01/17/19  0263    History   Chief Complaint Chief Complaint  Patient presents with  . Constipation    HPI [name] is a 73 y.o. 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  She reports trying MiraLAX and other stool softeners available over-the-counter without improvement.  She feels that she is passing liquid stool but has not had a normal bowel movement in 6 days.  She reports some abdominal cramping when she takes stool softeners but otherwise has no abdominal pain.  No history of similar symptoms in the past.  Denies any nausea, vomiting, changes to appetite or medication changes.  She denies any blood in her stool, fever, urinary symptoms, back pain, prior abdominal surgeries. She has not been passing gas.     HPI  Past Medical History:  Diagnosis Date  . 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 Anxiety   . Depression   . Full dentures   . Hemorrhoids    external  . History of radiation therapy 11/29/11 to 01/05/12   anal canal 5040 cGy 28 sessions, regional lymph nodes 4200 cGy 28 sessions  . Hyperlipidemia   . Rectal bleeding   . Vitamin D deficiency   . Wears glasses     Patient Active Problem List   Diagnosis Date Noted  . Hemorrhoids, external without complications 78/58/8502  . Depression   . Anxiety   . Rectal bleeding   . History of radiation therapy   . Malignant neoplasm of anal canal ([place]) 11/15/2011  . Hemorrhoids, internal, with bleeding 09/07/2011  . Acquired anal stenosis 09/07/2011  . Hyperlipidemia 09/07/2011  . Family history of breast cancer in sister 09/07/2011    Past Surgical History:  Procedure Laterality Date  . ABDOMINAL HYSTERECTOMY  1978   age 64  . EXAMINATION  UNDER ANESTHESIA  06/21/2012   Procedure: EXAM UNDER ANESTHESIA;  Surgeon: [name], MD;  Location: [place];  Service: General;  Laterality: N/A;  rectal exam under anesthesia, anal dilation, rectal biopsy  . [place]   Patient had to guess on the date.  [name] RECTAL BIOPSY  06/21/2012   Procedure: BIOPSY RECTAL;  Surgeon: [name], MD;  Location: [place];  Service: General;  Laterality: N/A;  . TUBAL LIGATION     b/l  age 59     OB History    Gravida  1   Para  1   Term      Preterm      AB      Living        SAB      TAB      Ectopic      Multiple      Live Births               Home Medications    Prior to Admission medications   Medication Sig Start Date End Date Taking? Authorizing Provider  ARIPiprazole (ABILIFY) 10 MG tablet Take 10 mg by mouth daily. 11/23/18  Yes [provider]  aspirin EC 81 MG tablet Take 81 mg by mouth daily.   Yes [provider]  Calcium Carb-Cholecalciferol (CALTRATE 600+D3 SOFT PO)  Take by mouth.   Yes [provider]  calcium carbonate (TUMS - DOSED IN MG ELEMENTAL CALCIUM) 500 MG chewable tablet Chew 1 tablet by mouth 2 (two) times daily.   Yes [provider]  cholecalciferol (VITAMIN D) 1000 UNITS tablet Take 1,000 Units by mouth daily.   Yes [provider]  cyanocobalamin 500 MCG tablet Take 500 mcg by mouth daily.   Yes [provider]  DULoxetine (CYMBALTA) 60 MG capsule Take 120 mg by mouth daily.    Yes [provider]  folic acid (FOLVITE) 696 MCG tablet Take 400 mcg by mouth daily.   Yes [provider]  gabapentin (NEURONTIN) 600 MG tablet Take 600 mg by mouth 2 (two) times daily. 07/14/16  Yes [provider]  Garlic 2952 MG CAPS Take by mouth daily.   Yes [provider]  levothyroxine (SYNTHROID, LEVOTHROID) 25 MCG tablet Take 25 mcg by mouth daily. 08/29/18  Yes  [provider]  Multiple Vitamins-Minerals (CENTRUM SILVER ULTRA WOMENS PO) Take by mouth.   Yes [provider]  Omega-3 Fatty Acids (FISH OIL) 1000 MG CAPS Take by mouth.   Yes [provider]  pravastatin (PRAVACHOL) 40 MG tablet Take 40 mg by mouth daily with breakfast.    Yes [provider]  traZODone (DESYREL) 100 MG tablet Take 200 mg by mouth at bedtime.  07/14/16  Yes [provider]  vitamin C (ASCORBIC ACID) 500 MG tablet Take 500 mg by mouth daily.   Yes [provider]  bisacodyl (DULCOLAX) 10 MG suppository Place 1 suppository (10 mg total) rectally as needed for moderate constipation. 01/17/19   Rabecka Brendel, PA-C  docusate sodium (COLACE) 100 MG capsule Take 1 capsule (100 mg total) by mouth every 12 (twelve) hours. 01/17/19   Larken Urias, PA-C  polyethylene glycol (MIRALAX / GLYCOLAX) packet Take 17 g by mouth daily. 01/17/19   Delia Heady, PA-C    Family History Family History  Problem Relation Age of Onset  . Cancer Sister        breast/ age 73  . Cervical cancer Sister        50  . Alcohol abuse Maternal Uncle     Social History Social History   Tobacco Use  . Smoking status: Former Smoker    Packs/day: 0.50    Years: 25.00    Pack years: 12.50    Types: Cigarettes    Last attempt to quit: 09/07/1987    Years since quitting: 31.3  . Smokeless tobacco: Never Used  Substance Use Topics  . Alcohol use: No  . Drug use: No     Allergies   Patient has no known allergies.   Review of Systems Review of Systems  Constitutional: Negative for appetite change, chills and fever.  HENT: Negative for ear pain, rhinorrhea, sneezing and sore throat.   Eyes: Negative for photophobia and visual disturbance.  Respiratory: Negative for cough, chest tightness, shortness of breath and wheezing.   Cardiovascular: Negative for chest pain and palpitations.  Gastrointestinal: Positive for constipation. Negative for  abdominal pain, blood in stool, diarrhea, nausea and vomiting.  Genitourinary: Negative for dysuria, hematuria and urgency.  Musculoskeletal: Negative for myalgias.  Skin: Negative for rash.  Neurological: Negative for dizziness, weakness and light-headedness.     Physical Exam Updated Vital Signs BP (!) 124/54   Pulse 83   Temp 98.1 F (36.7 C) (Oral)   Resp 16   Ht 5\' 5"  (1.651 m)  Wt 73.9 kg   SpO2 97%   BMI 27.12 kg/m   Physical Exam Vitals signs and nursing note reviewed. Exam conducted with a chaperone present.  Constitutional:      General: She is not in acute distress.    Appearance: She is well-developed.  HENT:     Head: Normocephalic and atraumatic.     Nose: Nose normal.  Eyes:     General: No scleral icterus.       Right eye: No discharge.        Left eye: No discharge.     Conjunctiva/sclera: Conjunctivae normal.  Neck:     Musculoskeletal: Normal range of motion and neck supple.  Cardiovascular:     Rate and Rhythm: Normal rate and regular rhythm.     Heart sounds: Normal heart sounds. No murmur. No friction rub. No gallop.   Pulmonary:     Effort: Pulmonary effort is normal. No respiratory distress.     Breath sounds: Normal breath sounds.  Abdominal:     General: Bowel sounds are normal. There is distension.     Palpations: Abdomen is soft.     Tenderness: There is no abdominal tenderness. There is no guarding.  Genitourinary:    Rectum: External hemorrhoid present.  Musculoskeletal: Normal range of motion.  Skin:    General: Skin is warm and dry.     Findings: No rash.  Neurological:     Mental Status: She is alert.     Motor: No abnormal muscle tone.     Coordination: Coordination normal.      ED Treatments / Results  Labs (all labs ordered are listed, but only abnormal results are displayed) Labs Reviewed  BASIC METABOLIC PANEL - Abnormal; Notable for the following components:      Result Value   Potassium 3.4 (*)    Glucose, Bld  133 (*)    All other components within normal limits  CBC WITH DIFFERENTIAL/PLATELET - Abnormal; Notable for the following components:   RBC 3.52 (*)    Hemoglobin 11.6 (*)    HCT 34.6 (*)    All other components within normal limits    EKG None  Radiology Ct Abdomen Pelvis W Contrast  Result Date: 01/17/2019 CLINICAL DATA:  Constipation and history of anal squamous carcinoma. History of rectal stenosis after treatment. EXAM: CT ABDOMEN AND PELVIS WITH CONTRAST TECHNIQUE: Multidetector CT imaging of the abdomen and pelvis was performed using the standard protocol following bolus administration of intravenous contrast. CONTRAST:  115mL ISOVUE-300 IOPAMIDOL (ISOVUE-300) INJECTION 61% COMPARISON:  CT of the abdomen and pelvis on 11/15/2011 FINDINGS: Lower chest: No acute abnormality. Hepatobiliary: No focal liver abnormality is seen. No gallstones, gallbladder wall thickening, or biliary dilatation. Pancreas: Unremarkable. No pancreatic ductal dilatation or surrounding inflammatory changes. Spleen: Normal in size without focal abnormality. Adrenals/Urinary Tract: Adrenal glands are unremarkable. Nonobstructing 5 mm calculus in the lower pole collecting system of the right kidney. No hydronephrosis. No renal masses. Bladder is unremarkable. Stomach/Bowel: There is diffuse dilatation of the entire colon which is filled with liquid and stool all the way to the level of the distal rectum/anus. The ascending colon measures up to 7.4 cm in diameter. Findings are likely secondary to distal rectal/anal obstruction given the history of prior treated anal carcinoma with history of post treatment stricture. There is some associated fecalization bowel contents in the terminal ileum with mild dilatation of the distal ileum up to 3 cm in caliber. More proximal small bowel shows  no dilatation. No evidence of free air or pneumatosis. No focal abscess identified. Vascular/Lymphatic: No significant vascular findings are  present. No enlarged abdominal or pelvic lymph nodes. Reproductive: Status post hysterectomy. No adnexal masses. Other: No abdominal wall hernia or abnormality. No abdominopelvic ascites. Musculoskeletal: Stable osteopenia and chronic compression deformities of the L1, L3 and L5 vertebral bodies which appears stable since 2013. IMPRESSION: Evidence of colonic obstruction with diffuse dilatation of the entire colon extending into the distal ileum. Level of obstruction is likely at the distal rectum/anus given history of anal carcinoma with post treatment stricture. No evidence of bowel perforation. Electronically Signed   By: Aletta Edouard M.D.   On: 01/17/2019 10:38    Procedures Fecal disimpaction Date/Time: 01/17/2019 11:16 AM Performed by: Delia Heady, PA-C Authorized by: Delia Heady, PA-C  Consent: Verbal consent obtained. Consent given by: patient Patient understanding: patient states understanding of the procedure being performed Patient identity confirmed: verbally with patient Local anesthesia used: no  Anesthesia: Local anesthesia used: no  Sedation: Patient sedated: no  Patient tolerance: Patient tolerated the procedure well with no immediate complications Comments: Large amount of stool noted at opening which was disimpacted.    (including critical care time)  Medications Ordered in ED Medications  iopamidol (ISOVUE-300) 61 % injection 100 mL (100 mLs Intravenous Contrast Given 01/17/19 1024)     Initial Impression / Assessment and Plan / ED Course  I have reviewed the triage vital signs and the nursing notes.  Pertinent labs & imaging results that were available during my care of the patient were reviewed by me and considered in my medical decision making (see chart for details).  Clinical Course as of Jan 16 1254  Wed Jan 17, 2019  1052 Per office visit in January: poorly differentiated carcinoma, basaloid squamous cell carcinoma, of the anal canal and posterior  vagina. Staging CT scans of the abdomen and pelvis on 11/15/2011 with suspicious perirectal lymph node and no evidence of distant metastatic disease. She began radiation 11/29/2011. She completed cycle 1 mitomycin and 5 fluorouracil 11/29/2011. She completed cycle 2 beginning 12/27/2011. She completed the course of radiation on 01/05/2012.   [HK]    Clinical Course User Index [HK] Delia Heady, PA-C       73 year old female presents to ED for constipation for the past 6 days.  No improvement with over-the-counter stool softeners.  Feels that she is passing liquid stool normal bowel movement.  Reports some abdominal cramping when she takes stool softeners but denies any abdominal pain other than that.  She denies history of similar symptoms in the past.  Denies any nausea, vomiting prior abdominal surgeries.  She has not been passing gas. Her abdomen does appear somewhat distended. CBC, BMP unremarkable.  CT abd/pel shows colonic obstruction with diffuse dilatation to distal ileum without perforation.  Spoke to surgery PA Claiborne Billings who recommends digital disimpaction and enema.  Soapsuds enema was unsuccessful.  I tried to digitally disimpact patient twice but there is a large amount of stool in the rectal vault which I am unable to get past.  Again spoke to surgery PA who states that she reviewed CT images with Dr. Rayburn Go.  They feel that she could have somewhat chronic distention.  Recommend that she is on a bowel regimen including MiraLAX, Colace and Dulcolax suppositories as well as following clear liquid diet until her distention improves.  They feel that she can follow-up with her PCP for this matter.  Patient is agreeable to this  plan as she does not want to be admitted to the hospital.  Advised to return to ED for any severe worsening symptoms.  Patient is hemodynamically stable, in NAD, and able to ambulate in the ED. Evaluation does not show pathology that would require ongoing emergent  intervention or inpatient treatment. I explained the diagnosis to the patient. Pain has been managed and has no complaints prior to discharge. Patient is comfortable with above plan and is stable for discharge at this time. All questions were answered prior to disposition. Strict return precautions for returning to the ED were discussed. Encouraged follow up with PCP.    Portions of this note were generated with [employer]. Dictation errors may occur despite best attempts at proofreading.   Final Clinical Impressions(s) / ED Diagnoses   Final diagnoses:  Fecal impaction in rectum [facility])    ED Discharge Orders         Ordered    docusate sodium (COLACE) 100 MG capsule  Every 12 hours     01/17/19 1253    polyethylene glycol (MIRALAX / GLYCOLAX) packet  Daily     01/17/19 1253    bisacodyl (DULCOLAX) 10 MG suppository  As needed     01/17/19 1253           Delia Heady, PA-C 01/17/19 1255    Maudie Flakes, MD 01/17/19 1527

## 2019-01-17 NOTE — ED Triage Notes (Signed)
Patient c/o constipation x 6 days. Reports trying Miralax and other stool softeners at home without relief. Patient also reports diarrhea x 6 days. Denies chest pain, SOB, fever, dysuria, nausea, and vomiting.

## 2019-01-18 ENCOUNTER — Other Ambulatory Visit: Payer: Self-pay

## 2019-01-18 ENCOUNTER — Encounter (HOSPITAL_COMMUNITY): Payer: Self-pay

## 2019-01-18 ENCOUNTER — Emergency Department (HOSPITAL_COMMUNITY)
Admission: EM | Admit: 2019-01-18 | Discharge: 2019-01-18 | Disposition: A | Discharge status: Home / Self Care | Payer: Medicare Other | Attending: Emergency Medicine | Admitting: Emergency Medicine

## 2019-01-18 DIAGNOSIS — K5641 Fecal impaction: Secondary | ICD-10-CM | POA: Diagnosis not present

## 2019-01-18 DIAGNOSIS — Z87891 Personal history of nicotine dependence: Secondary | ICD-10-CM | POA: Insufficient documentation

## 2019-01-18 DIAGNOSIS — Z79899 Other long term (current) drug therapy: Secondary | ICD-10-CM | POA: Diagnosis not present

## 2019-01-18 DIAGNOSIS — Z7982 Long term (current) use of aspirin: Secondary | ICD-10-CM | POA: Diagnosis not present

## 2019-01-18 MED ORDER — FLEET ENEMA 7-19 GM/118ML RE ENEM
1.0000 | ENEMA | Freq: Once | RECTAL | Status: AC
  Administered 2019-01-18: 1 via RECTAL
  Filled 2019-01-18: qty 1

## 2019-01-18 MED ORDER — MINERAL OIL RE ENEM
1.0000 | ENEMA | Freq: Once | RECTAL | Status: AC
  Administered 2019-01-18: 1 via RECTAL
  Filled 2019-01-18: qty 1

## 2019-01-18 NOTE — ED Notes (Addendum)
Pt. walked out before nurse could hand her, her discharge papers, but she had been cleared to leave by the doctor. Doctor made aware.

## 2019-01-18 NOTE — ED Triage Notes (Addendum)
Pt came back to the ED after increasing pain in the rectum. Used stool softeners with no relief, attempted to have a bowel movement, has the feeling to have a bowel movement but cant.

## 2019-01-18 NOTE — ED Provider Notes (Signed)
[facility] EMERGENCY DEPARTMENT Provider Note   CSN: [id number] Arrival date & time: 01/18/19  2993    History   Chief Complaint Chief Complaint  Patient presents with  . Rectal Pain    HPI [name] is a 73 y.o. female.     Patient is a 73 year old female with a history of anal cancer status post chemo and radiation and recent presentation to the emergency room yesterday for constipation for the last 6 days and rectal pain.  Yesterday patient had a CT scan that showed significant colonic distention and concern for distal obstruction from possible rectal stricture.  Patient had an enema and attempts for manual disimpaction with only minimal stool removed.  Patient sent home with MiraLAX and stool softeners.  Patient got home and has taken those medications but states is still not been able to have a bowel movement.  She states she sat on the toilet all night with the urge to go to the bathroom but nothing would come out.  She denies any significant abdominal pain but will have an occasional cramp.  She has had no fever, nausea or vomiting.  She is complaining of significant rectal pain and stool leakage in her underwear.  sHe has not noticed any bleeding.  The history is provided by the patient.    Past Medical History:  Diagnosis Date  . 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 Anxiety   . Depression   . Full dentures   . Hemorrhoids    external  . History of radiation therapy 11/29/11 to 01/05/12   anal canal 5040 cGy 28 sessions, regional lymph nodes 4200 cGy 28 sessions  . Hyperlipidemia   . Rectal bleeding   . Vitamin D deficiency   . Wears glasses     Patient Active Problem List   Diagnosis Date Noted  . Hemorrhoids, external without complications 71/69/6789  . Depression   . Anxiety   . Rectal bleeding   . History of radiation therapy   . Malignant neoplasm of anal canal ([place]) 11/15/2011  . Hemorrhoids, internal, with bleeding  09/07/2011  . Acquired anal stenosis 09/07/2011  . Hyperlipidemia 09/07/2011  . Family history of breast cancer in sister 09/07/2011    Past Surgical History:  Procedure Laterality Date  . ABDOMINAL HYSTERECTOMY  1978   age 29  . EXAMINATION UNDER ANESTHESIA  06/21/2012   Procedure: EXAM UNDER ANESTHESIA;  Surgeon: [name], MD;  Location: [facility];  Service: General;  Laterality: N/A;  rectal exam under anesthesia, anal dilation, rectal biopsy  . [place]   Patient had to guess on the date.  [name] RECTAL BIOPSY  06/21/2012   Procedure: BIOPSY RECTAL;  Surgeon: [name], MD;  Location: [facility];  Service: General;  Laterality: N/A;  . TUBAL LIGATION     b/l  age 30     OB History    Gravida  1   Para  1   Term      Preterm      AB      Living        SAB      TAB      Ectopic      Multiple      Live Births               Home Medications    Prior to Admission medications   Medication  Sig Start Date End Date Taking? Authorizing Provider  ARIPiprazole (ABILIFY) 10 MG tablet Take 10 mg by mouth daily. 11/23/18   [provider]  aspirin EC 81 MG tablet Take 81 mg by mouth daily.    [provider]  bisacodyl (DULCOLAX) 10 MG suppository Place 1 suppository (10 mg total) rectally as needed for moderate constipation. 01/17/19   Khatri, Hina, PA-C  Calcium Carb-Cholecalciferol (CALTRATE 600+D3 SOFT PO) Take by mouth.    [provider]  calcium carbonate (TUMS - DOSED IN MG ELEMENTAL CALCIUM) 500 MG chewable tablet Chew 1 tablet by mouth 2 (two) times daily.    [provider]  cholecalciferol (VITAMIN D) 1000 UNITS tablet Take 1,000 Units by mouth daily.    [provider]  cyanocobalamin 500 MCG tablet Take 500 mcg by mouth daily.    [provider]  docusate sodium (COLACE) 100 MG capsule Take 1 capsule (100 mg total) by mouth every 12 (twelve) hours.  01/17/19   Khatri, Hina, PA-C  DULoxetine (CYMBALTA) 60 MG capsule Take 120 mg by mouth daily.     [provider]  folic acid (FOLVITE) 389 MCG tablet Take 400 mcg by mouth daily.    [provider]  gabapentin (NEURONTIN) 600 MG tablet Take 600 mg by mouth 2 (two) times daily. 07/14/16   [provider]  Garlic 3734 MG CAPS Take by mouth daily.    [provider]  levothyroxine (SYNTHROID, LEVOTHROID) 25 MCG tablet Take 25 mcg by mouth daily. 08/29/18   [provider]  Multiple Vitamins-Minerals (CENTRUM SILVER ULTRA WOMENS PO) Take by mouth.    [provider]  Omega-3 Fatty Acids (FISH OIL) 1000 MG CAPS Take by mouth.    [provider]  polyethylene glycol (MIRALAX / GLYCOLAX) packet Take 17 g by mouth daily. 01/17/19   Khatri, Hina, PA-C  pravastatin (PRAVACHOL) 40 MG tablet Take 40 mg by mouth daily with breakfast.     [provider]  traZODone (DESYREL) 100 MG tablet Take 200 mg by mouth at bedtime.  07/14/16   [provider]  vitamin C (ASCORBIC ACID) 500 MG tablet Take 500 mg by mouth daily.    [provider]    Family History Family History  Problem Relation Age of Onset  . Cancer Sister        breast/ age 42  . Cervical cancer Sister        30  . Alcohol abuse Maternal Uncle     Social History Social History   Tobacco Use  . Smoking status: Former Smoker    Packs/day: 0.50    Years: 25.00    Pack years: 12.50    Types: Cigarettes    Last attempt to quit: 09/07/1987    Years since quitting: 31.3  . Smokeless tobacco: Never Used  Substance Use Topics  . Alcohol use: No  . Drug use: No     Allergies   Patient has no known allergies.   Review of Systems Review of Systems  All other systems reviewed and are negative.    Physical Exam Updated Vital Signs BP 140/76 (BP Location: Left Arm)   Pulse (!) 103   Temp 99.1 F (37.3 C) (Oral)   Resp 20   SpO2 95%    Physical Exam Vitals signs and nursing note reviewed.  Constitutional:      General: She is not in acute distress.    Appearance: She is well-developed.  Comments: Appears uncomfortable sitting  HENT:     Head: Normocephalic and atraumatic.     Mouth/Throat:     Mouth: Mucous membranes are moist.  Eyes:     Pupils: Pupils are equal, round, and reactive to light.  Cardiovascular:     Rate and Rhythm: Regular rhythm. Tachycardia present.     Heart sounds: Normal heart sounds. No murmur. No friction rub.  Pulmonary:     Effort: Pulmonary effort is normal.     Breath sounds: Normal breath sounds. No wheezing or rales.  Abdominal:     General: Bowel sounds are normal. There is no distension.     Palpations: Abdomen is soft.     Tenderness: There is no abdominal tenderness. There is no guarding or rebound.  Musculoskeletal: Normal range of motion.        General: No tenderness.     Comments: No edema  Skin:    General: Skin is warm and dry.     Findings: No rash.  Neurological:     Mental Status: She is alert and oriented to person, place, and time. Mental status is at baseline.     Cranial Nerves: No cranial nerve deficit.     Gait: Gait normal.  Psychiatric:        Mood and Affect: Mood normal.        Behavior: Behavior normal.        Thought Content: Thought content normal.      ED Treatments / Results  Labs (all labs ordered are listed, but only abnormal results are displayed) Labs Reviewed - No data to display  EKG None  Radiology Ct Abdomen Pelvis W Contrast  Result Date: 01/17/2019 CLINICAL DATA:  Constipation and history of anal squamous carcinoma. History of rectal stenosis after treatment. EXAM: CT ABDOMEN AND PELVIS WITH CONTRAST TECHNIQUE: Multidetector CT imaging of the abdomen and pelvis was performed using the standard protocol following bolus administration of intravenous contrast. CONTRAST:  198mL ISOVUE-300 IOPAMIDOL (ISOVUE-300) INJECTION 61%  COMPARISON:  CT of the abdomen and pelvis on 11/15/2011 FINDINGS: Lower chest: No acute abnormality. Hepatobiliary: No focal liver abnormality is seen. No gallstones, gallbladder wall thickening, or biliary dilatation. Pancreas: Unremarkable. No pancreatic ductal dilatation or surrounding inflammatory changes. Spleen: Normal in size without focal abnormality. Adrenals/Urinary Tract: Adrenal glands are unremarkable. Nonobstructing 5 mm calculus in the lower pole collecting system of the right kidney. No hydronephrosis. No renal masses. Bladder is unremarkable. Stomach/Bowel: There is diffuse dilatation of the entire colon which is filled with liquid and stool all the way to the level of the distal rectum/anus. The ascending colon measures up to 7.4 cm in diameter. Findings are likely secondary to distal rectal/anal obstruction given the history of prior treated anal carcinoma with history of post treatment stricture. There is some associated fecalization bowel contents in the terminal ileum with mild dilatation of the distal ileum up to 3 cm in caliber. More proximal small bowel shows no dilatation. No evidence of free air or pneumatosis. No focal abscess identified. Vascular/Lymphatic: No significant vascular findings are present. No enlarged abdominal or pelvic lymph nodes. Reproductive: Status post hysterectomy. No adnexal masses. Other: No abdominal wall hernia or abnormality. No abdominopelvic ascites. Musculoskeletal: Stable osteopenia and chronic compression deformities of the L1, L3 and L5 vertebral bodies which appears stable since 2013. IMPRESSION: Evidence of colonic obstruction with diffuse dilatation of the entire colon extending into the distal ileum. Level of obstruction is likely at the distal rectum/anus given  history of anal carcinoma with post treatment stricture. No evidence of bowel perforation. Electronically Signed   By: Aletta Edouard M.D.   On: 01/17/2019 10:38    Procedures Procedures  (including critical care time)  Medications Ordered in ED Medications - No data to display   Initial Impression / Assessment and Plan / ED Course  I have reviewed the triage vital signs and the nursing notes.  Pertinent labs & imaging results that were available during my care of the patient were reviewed by me and considered in my medical decision making (see chart for details).        Elderly female presenting with persistent rectal pain and fecal impaction.  She has been taking oral medications without resolve.  She was seen yesterday for same.  Had a CT that showed significant colonic dilation and distal obstruction from most likely rectal stricture.  Enema and digital disimpaction was attempted yesterday but unsuccessful.  Patient returns today because she has not been able to have a bowel movement.  She has no abdominal findings concerning for perforation at this time.  She is otherwise stable in appearance.  Will attempt further rectal disimpaction.  For more details of patient's past cancer history and treatment you can see the note from yesterday.  10:23 AM First attempt at fecal disimpaction with small amount of straw like stool removed.  Will now do an enema and have patient sit on the toilet.  Will most likely need recurrent digital disimpactions  2:51 PM After second digital disimpaction and enema patient has massive amount of stool and is now feeling much better.  She is no longer having rectal pain and is comfortable going home. Final Clinical Impressions(s) / ED Diagnoses   Final diagnoses:  Fecal impaction of rectum [facility])    ED Discharge Orders    None       Blanchie Dessert, MD 01/18/19 1453

## 2019-01-18 NOTE — Discharge Instructions (Signed)
Continue to take stool softeners and also maybe miralax if you feel any hint of constipation.  Return if you develop fever, abdominal pain or other concerns.

## 2019-07-04 DIAGNOSIS — F331 Major depressive disorder, recurrent, moderate: Secondary | ICD-10-CM | POA: Diagnosis not present

## 2019-07-04 DIAGNOSIS — F411 Generalized anxiety disorder: Secondary | ICD-10-CM | POA: Diagnosis not present

## 2019-08-08 ENCOUNTER — Ambulatory Visit (INDEPENDENT_AMBULATORY_CARE_PROVIDER_SITE_OTHER): Payer: Medicare Other | Admitting: Orthopedic Surgery

## 2019-08-08 ENCOUNTER — Ambulatory Visit: Payer: Self-pay

## 2019-08-08 ENCOUNTER — Encounter: Payer: Self-pay | Admitting: Orthopedic Surgery

## 2019-08-08 VITALS — Ht 65.0 in | Wt 165.0 lb

## 2019-08-08 DIAGNOSIS — S32010A Wedge compression fracture of first lumbar vertebra, initial encounter for closed fracture: Secondary | ICD-10-CM | POA: Diagnosis not present

## 2019-08-08 MED ORDER — TRAMADOL HCL 50 MG PO TABS: 50.0000 mg | ORAL_TABLET | Freq: Every day | ORAL | 0 refills | Status: DC | PRN

## 2019-08-09 ENCOUNTER — Telehealth: Payer: Self-pay | Admitting: Orthopedic Surgery

## 2019-08-09 NOTE — Telephone Encounter (Signed)
Can you please call? They will not take from me.

## 2019-08-09 NOTE — Telephone Encounter (Signed)
Patient called advised the pharmacy told her that unless the Rx is filled for 30 days they can not fill the prescription. The pharmacist need to speak with Dr Marlou Sa in order to fill the Rx. The pharmacy is [facility] on [address]. The number to contact patient is [phone]

## 2019-08-10 ENCOUNTER — Encounter: Payer: Self-pay | Admitting: Orthopedic Surgery

## 2019-08-10 NOTE — Progress Notes (Signed)
Office Visit Note   [name]           Date of Birth: 1946-05-08           MRN: [id number] Visit Date: 08/08/2019 Requested by: [name], MD [place] [place],  [place] [address] PCP: [name], MD  Subjective: Chief Complaint  Patient presents with   Lower Back - Pain    HPI: [name] is a 73 y.o. female who presents to the office complaining of low back pain.  Patient notes pain for 6 months.  She cannot recall an injury.  She notes gradual onset of pain.  She complains of numbness and tingling as well as radicular leg pain in both legs that radiates down to her knees.  She denies any weakness or waking with pain.  Her pain is relieved with Tylenol arthritis somewhat and with a back brace.  She wears a back brace when she works, she works part-time at [employer].  Pain is been relatively constant for the last 6 months and is not improving.  Denies any bowel or bladder symptoms              ROS:  All systems reviewed are negative as they relate to the chief complaint within the history of present illness.  Patient denies fevers or chills.  Assessment & Plan: Visit Diagnoses:  1. Compression fracture of L1 vertebra, initial encounter [facility])     Plan: Patient is a 73 year old female who presents complaining of 6 months of lumbar back pain.  Patient has not had an injury.  She is having radicular symptoms and numbness and tingling.  X-rays taken today reveal a lumbar compression fracture of L1 vertebra.  Main concern here is possible pathologic process of this vertebral body fracture.  With no history of trauma and fairly insidious but constant pain the possibility of osteoporotic compression fracture versus fracture from some other type of bone destructive process must be considered.  Nothing is obvious on plain radiographs today other than 50% compression fracture of that vertebral body.  No other systemic signs of illness  present..Ordered MRI of the lumbar spine to evaluate lumbar compression fracture.  Patient agreed with plan.  Prescribed tramadol daily as needed for pain.  Patient will follow-up after MRI to review results.  Follow-Up Instructions: No follow-ups on file.   Orders:  Orders Placed This Encounter  Procedures   XR Lumbar Spine 2-3 Views   MR Lumbar Spine w/o contrast   Meds ordered this encounter  Medications   traMADol (ULTRAM) 50 MG tablet    Sig: Take 1 tablet (50 mg total) by mouth daily as needed.    Dispense:  30 tablet    Refill:  0      Procedures: No procedures performed   Clinical Data: No additional findings.  Objective: Vital Signs: Ht 5\' 5"  (1.651 m)    Wt 165 lb (74.8 kg)    BMI 27.46 kg/m   Physical Exam:  Constitutional: Patient appears well-developed HEENT:  Head: Normocephalic Eyes:EOM are normal Neck: Normal range of motion Cardiovascular: Normal rate Pulmonary/chest: Effort normal Neurologic: Patient is alert Skin: Skin is warm Psychiatric: Patient has normal mood and affect  Ortho Exam:  Back exam No tenderness along the axial lumbar spine. Mild tenderness along the paraspinal musculature Negative straight leg raise bilaterally 5/5 motor strength of the bilateral hip flexors, quadriceps, dorsiflexion, plantarflexion, EHL Normal reflexes of the bilateral patellar reflex  and Achilles reflex  Specialty Comments:  No specialty comments available.  Imaging: No results found.   PMFS History: Patient Active Problem List   Diagnosis Date Noted   Hemorrhoids, external without complications 0000000   Depression    Anxiety    Rectal bleeding    History of radiation therapy    Malignant neoplasm of anal canal ([place]) 11/15/2011   Hemorrhoids, internal, with bleeding 09/07/2011   Acquired anal stenosis 09/07/2011   Hyperlipidemia 09/07/2011   Family history of breast cancer in sister 09/07/2011   Past Medical History:    Diagnosis Date   anal ca dx'd 11/2011   squamous cell carcinoma   Anxiety    Depression    Full dentures    Hemorrhoids    external   History of radiation therapy 11/29/11 to 01/05/12   anal canal 5040 cGy 28 sessions, regional lymph nodes 4200 cGy 28 sessions   Hyperlipidemia    Rectal bleeding    Vitamin D deficiency    Wears glasses     Family History  Problem Relation Age of Onset   Cancer Sister        breast/ age 93   Cervical cancer Sister        11   Alcohol abuse Maternal Uncle     Past Surgical History:  Procedure Laterality Date   ABDOMINAL HYSTERECTOMY  1978   age 12   EXAMINATION UNDER ANESTHESIA  06/21/2012   Procedure: EXAM UNDER ANESTHESIA;  Surgeon: [name], MD;  Location: [facility];  Service: General;  Laterality: N/A;  rectal exam under anesthesia, anal dilation, rectal biopsy   [place]   Patient had to guess on the date.   RECTAL BIOPSY  06/21/2012   Procedure: BIOPSY RECTAL;  Surgeon: [name], MD;  Location: [facility];  Service: General;  Laterality: N/A;   TUBAL LIGATION     b/l  age 34   Social History   Occupational History   Not on file  Tobacco Use   Smoking status: Former Smoker    Packs/day: 0.50    Years: 25.00    Pack years: 12.50    Types: Cigarettes    Quit date: 09/07/1987    Years since quitting: 31.9   Smokeless tobacco: Never Used  Substance and Sexual Activity   Alcohol use: No   Drug use: No   Sexual activity: Not on file

## 2019-08-29 ENCOUNTER — Other Ambulatory Visit: Payer: Medicare Other

## 2019-09-05 ENCOUNTER — Ambulatory Visit: Payer: Medicare Other | Admitting: Orthopedic Surgery

## 2019-09-07 ENCOUNTER — Other Ambulatory Visit: Payer: Self-pay

## 2019-09-07 ENCOUNTER — Ambulatory Visit
Admission: RE | Admit: 2019-09-07 | Discharge: 2019-09-07 | Disposition: A | Discharge status: Home / Self Care | Payer: Medicare Other | Source: Ambulatory Visit | Attending: Orthopedic Surgery | Admitting: Orthopedic Surgery

## 2019-09-07 DIAGNOSIS — S32010A Wedge compression fracture of first lumbar vertebra, initial encounter for closed fracture: Secondary | ICD-10-CM

## 2019-09-07 IMAGING — MR MR LUMBAR SPINE W/O CM
4 of 5 series · 26 of 48 positions shown · non-contrast
Comparison: CT abdomen/pelvis [DATE]

CLINICAL DATA: Low back pain radiating down the both legs with
numbness and tingling for 6 months 2 approximately a year. No prior
surgery. History of rectal cancer in [M8].

EXAM:
MRI LUMBAR SPINE WITHOUT CONTRAST
TECHNIQUE: Multiplanar, multisequence MR imaging of the lumbar spine was
performed. No intravenous contrast was administered.

[Series 2: T2 · sagittal · 4.0mm · 1.09mm/px · 6 of 16 slices shown (1 of 2)]
[im 1/16]
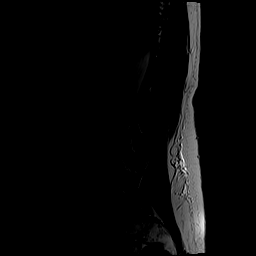
[im 4/16]
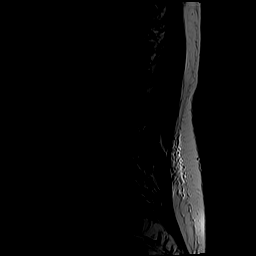
[im 7/16]
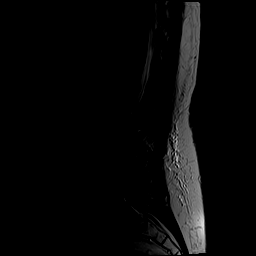
[im 10/16]
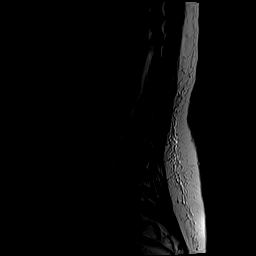
[im 13/16]
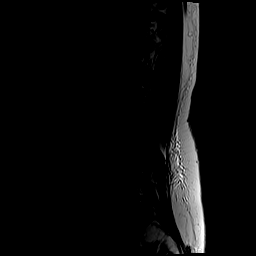
[im 16/16]
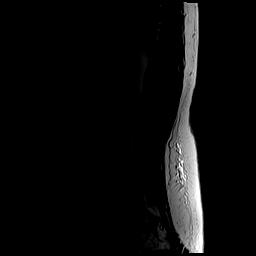

[Series 4: T1 · sagittal · 4.0mm · 1.09mm/px · 5 of 16 slices shown (1 of 2)]
[im 1/16]
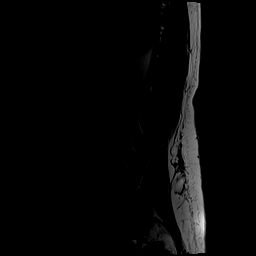
[im 4/16]
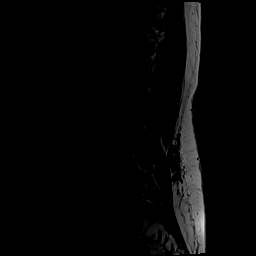
[im 8/16]
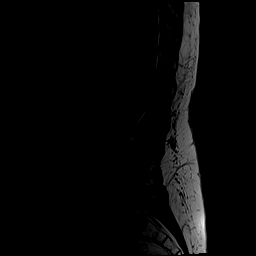
[im 12/16]
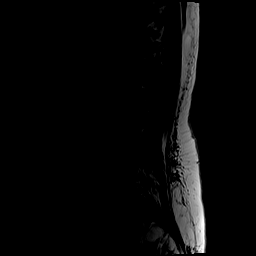
[im 16/16]
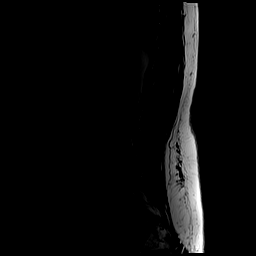

[Series 5: T2 · axial · 4.0mm · 0.39mm/px · z∈[-31,+190]mm · 10 of 46 slices shown (2 of 2)]
[im 4/46]
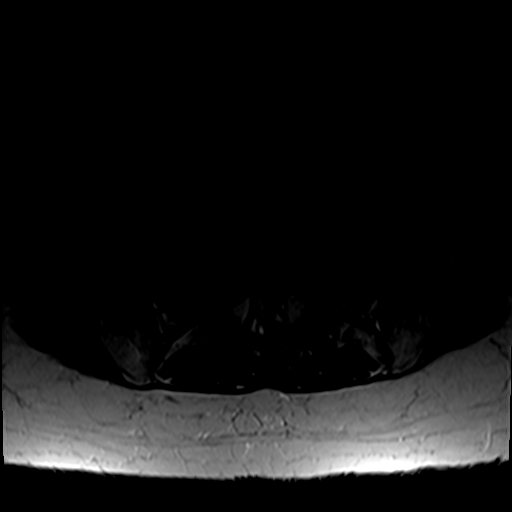
[im 7/46]
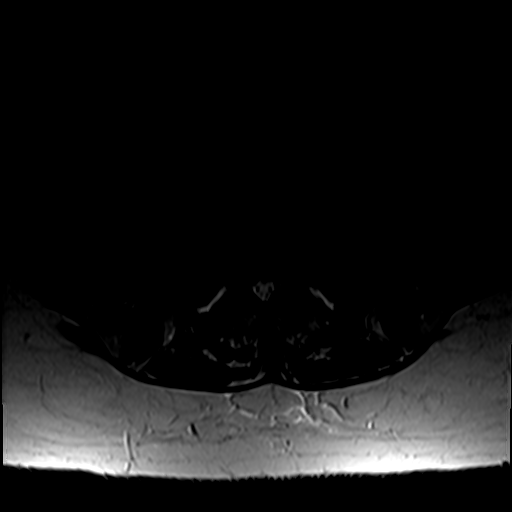
[im 10/46]
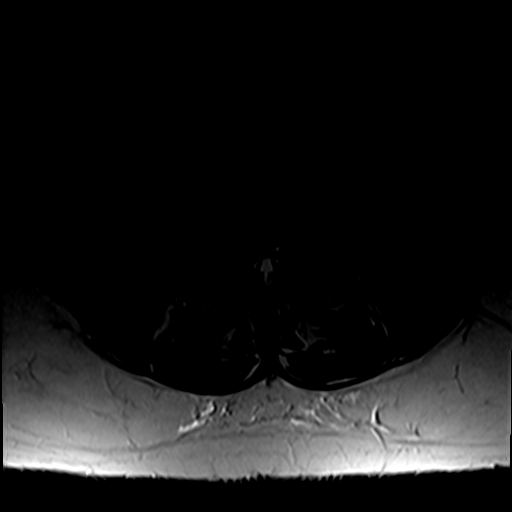
[im 16/46]
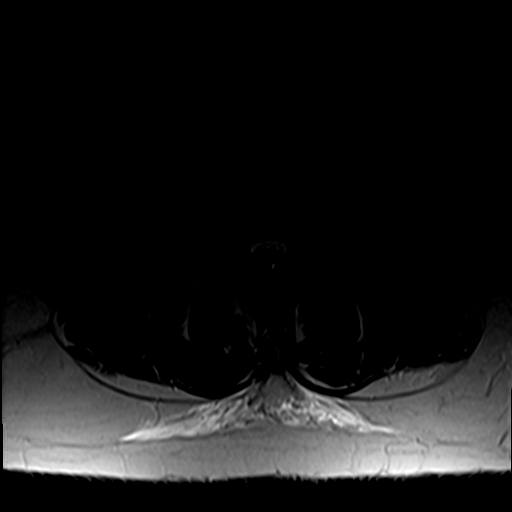
[im 22/46]
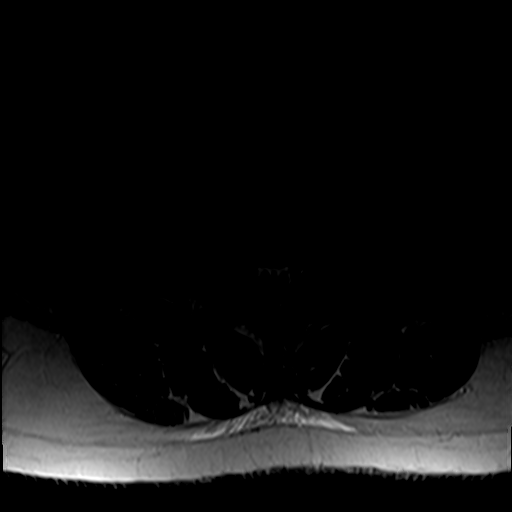
[im 25/46]
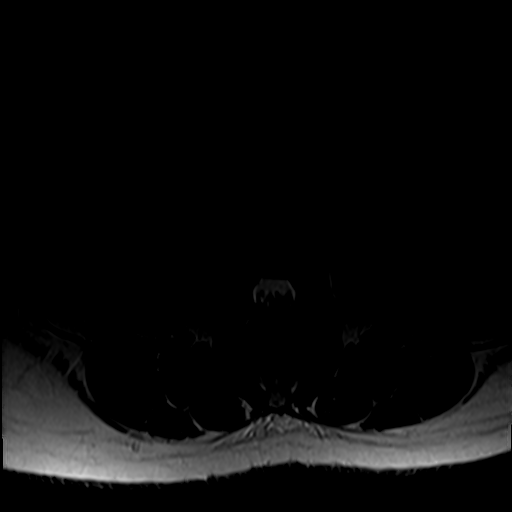
[im 28/46]
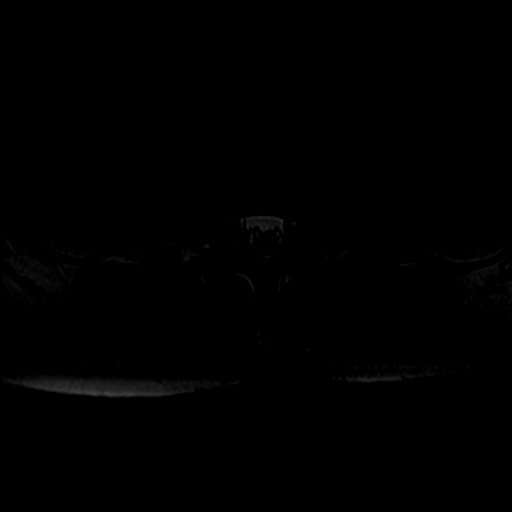
[im 34/46]
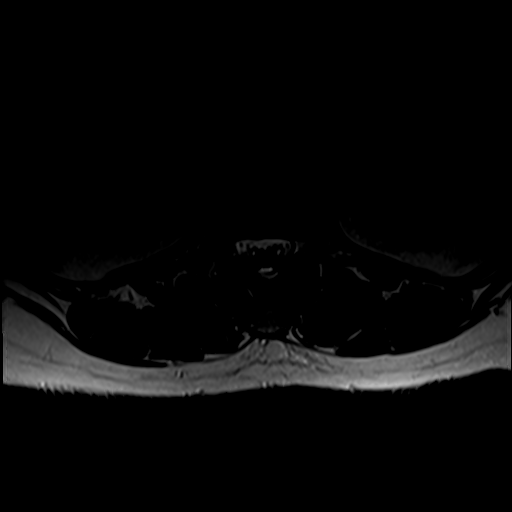
[im 40/46]
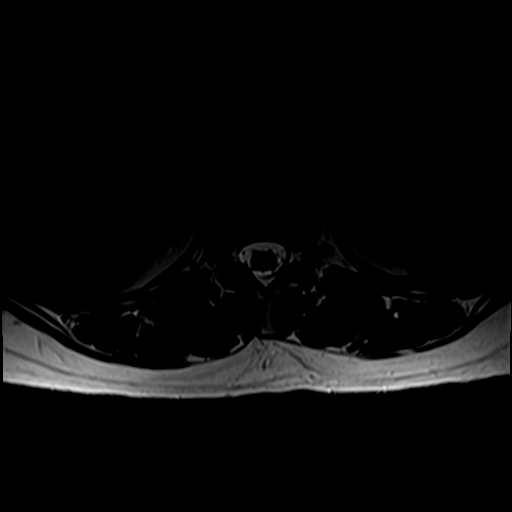
[im 46/46]
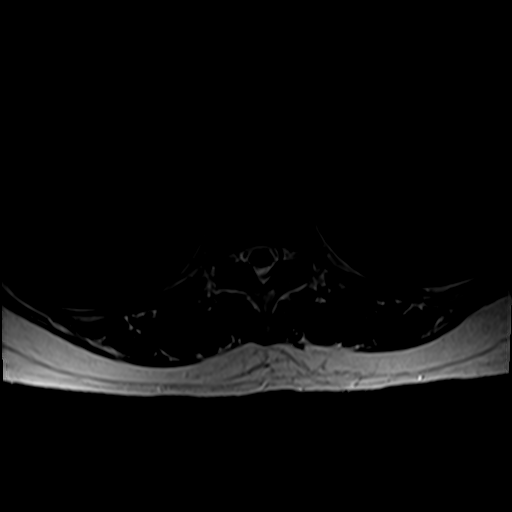

[Series 6: T1 · axial · 4.0mm · 0.39mm/px · z∈[-31,+161]mm · 5 of 46 slices shown (2 of 2)]
[im 4/46]
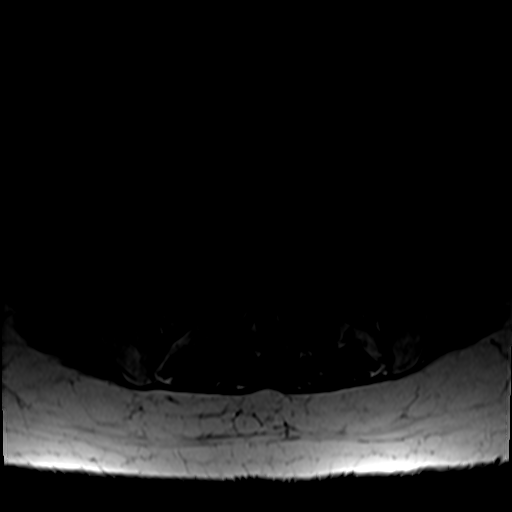
[im 7/46]
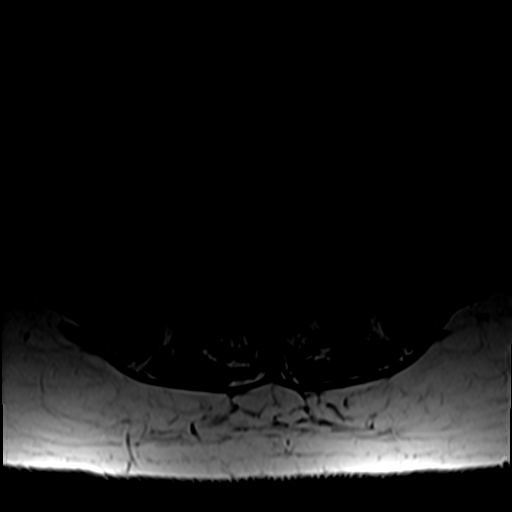
[im 10/46]
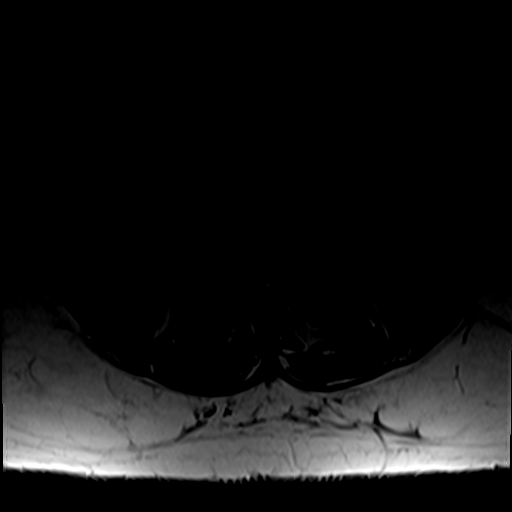
[im 25/46]
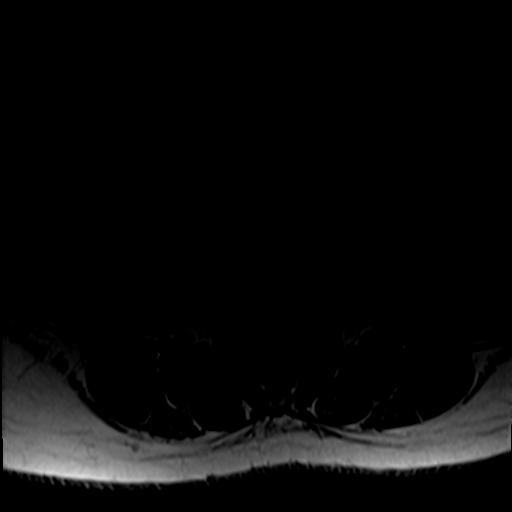
[im 40/46]
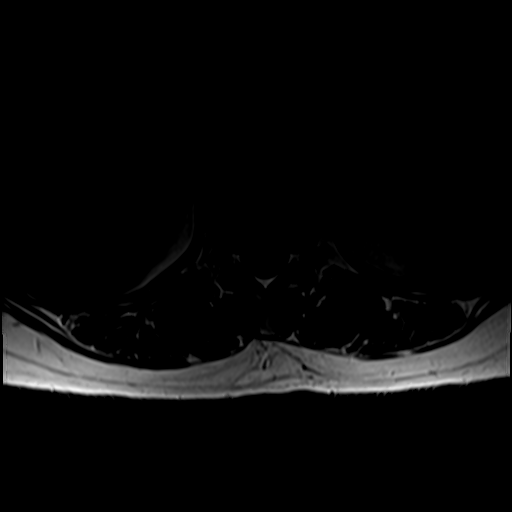

[26 of 48 positions shown; findings below may reference images not displayed]

FINDINGS: Segmentation:  Standard.

Alignment: Minimal grade 1 anterolisthesis of L4 on L5 and L5 on S1.

Vertebrae: No acute fracture, discitis-osteomyelitis, or aggressive
bone lesion. Chronic T10, T11, L1, L3 and L5 vertebral body
compression fractures. No focal marrow signal abnormality.

Conus medullaris and cauda equina: Conus extends to the T12-L1
level. Conus and cauda equina appear normal.

Paraspinal and other soft tissues: No acute paraspinal abnormality.

Disc levels:

Disc spaces: Degenerative disease with disc height loss at L5-S1.
Disc desiccation at L1-2, L2-3, L3-4 and L4-5.

T12-L1: Broad-based disc bulge mildly flattening the ventral thecal
sac. Mild bilateral facet arthropathy. No evidence of neural
foraminal stenosis. No central canal stenosis.

L1-L2: Mild broad-based disc bulge. Mild bilateral facet
arthropathy. No evidence of neural foraminal stenosis. No central
canal stenosis.

L2-L3: Mild broad-based disc bulge. Moderate bilateral facet
arthropathy. Mild bilateral foraminal stenosis. Mild spinal
stenosis.

L3-L4: Mild broad-based disc bulge flattening the ventral thecal
sac. Moderate bilateral facet arthropathy with ligamentum flavum
infolding. No evidence of neural foraminal stenosis. No central
canal stenosis.

L4-L5: Broad-based disc bulge. Ankylosis of the posterior elements
at L4-5 with hypertrophic changes. Mild bilateral foraminal
stenosis. Mild spinal stenosis.

L5-S1: Broad-based disc bulge. Severe bilateral facet arthropathy
with bilateral facet effusions. Bilateral subarticular recess
stenosis. Severe left and moderate right foraminal stenosis.
IMPRESSION: 1. Diffuse lumbar spine spondylosis as detailed above.
2.  No acute osseous injury of the lumbar spine.
3. No aggressive osseous lesion to suggest metastatic disease.

## 2019-09-12 ENCOUNTER — Ambulatory Visit: Payer: Medicare Other | Admitting: Orthopedic Surgery

## 2019-09-12 ENCOUNTER — Telehealth: Payer: Self-pay | Admitting: Orthopedic Surgery

## 2019-09-12 ENCOUNTER — Other Ambulatory Visit: Payer: Self-pay

## 2019-09-12 ENCOUNTER — Encounter: Payer: Self-pay | Admitting: Orthopedic Surgery

## 2019-09-12 DIAGNOSIS — S32010A Wedge compression fracture of first lumbar vertebra, initial encounter for closed fracture: Secondary | ICD-10-CM | POA: Diagnosis not present

## 2019-09-12 MED ORDER — ACETAMINOPHEN-CODEINE #3 300-30 MG PO TABS: ORAL_TABLET | ORAL | 0 refills | Status: DC

## 2019-09-12 NOTE — Telephone Encounter (Signed)
Patient called stating that  she take two sleeping pills at night and want to know if she take the Acetaminophen will it cause any reaction taking both of them together? The number to contact patient is [phone]

## 2019-09-12 NOTE — Telephone Encounter (Signed)
Please advise. Thanks.  

## 2019-09-12 NOTE — Progress Notes (Signed)
Office Visit Note   [name]           Date of Birth: 1946-06-19           MRN: [id number] Visit Date: 09/12/2019 Requested by: Tiffany Stains, MD [name],  [place] [id number] PCP: Tiffany Stains, MD  Subjective: Chief Complaint  Patient presents with  . Follow-up    HPI: [name] is a patient here for follow-up lumbar spine MRI.  That scan is reviewed.  On plain radiographs it appears that she has a chronic compression fracture.  That was not commented upon on the MRI scan but on my review she definitely has trapezoidal compression of the L1 vertebral body to a significant degree.  Tramadol has not been helpful for her symptoms.  She does use a brace.  Medications have also not been helpful such as Tylenol arthritis.  She does not take any blood thinners.  The MRI scan also shows L5-S1 facet arthritis along with foraminal stenosis.              ROS: All systems reviewed are negative as they relate to the chief complaint within the history of present illness.  Patient denies  fevers or chills.   Assessment & Plan: Visit Diagnoses:  1. Compression fracture of L1 vertebra, initial encounter ([place])     Plan: Impression is L5-S1 facet arthritis and foraminal stenosis giving her some bilateral leg symptoms.  I think she needs referral to [name] for lumbar spine ESI.  One-time prescription Tylenol with codeine written.  Follow-up with me as needed and follow-up with [name] next.  Follow-Up Instructions: No follow-ups on file.   Orders:  Orders Placed This Encounter  Procedures  . Ambulatory referral to Physical Medicine Rehab   Meds ordered this encounter  Medications  . acetaminophen-codeine (TYLENOL #3) 300-30 MG tablet    Sig: 1 po q 12hrs prn pain    Dispense:  20 tablet    Refill:  0      Procedures: No procedures performed   Clinical Data: No additional findings.  Objective: Vital Signs: There were no vitals taken for  this visit.  Physical Exam:   Constitutional: Patient appears well-developed HEENT:  Head: Normocephalic Eyes:EOM are normal Neck: Normal range of motion Cardiovascular: Normal rate Pulmonary/chest: Effort normal Neurologic: Patient is alert Skin: Skin is warm Psychiatric: Patient has normal mood and affect    Ortho Exam: Ortho exam demonstrates no nerve root tension signs and good hip flexion strength 5+ out of 5 bilaterally.  No groin pain with internal or external rotation of the leg.  Subjective paresthesias in the L5 distribution bilaterally.  Ankle dorsiflexion plantarflexion quad hamstring strength 5+ out of 5 bilaterally.  Specialty Comments:  No specialty comments available.  Imaging: No results found.   PMFS History: Patient Active Problem List   Diagnosis Date Noted  . Hemorrhoids, external without complications 0000000  . Depression   . Anxiety   . Rectal bleeding   . History of radiation therapy   . Malignant neoplasm of anal canal ([place]) 11/15/2011  . Hemorrhoids, internal, with bleeding 09/07/2011  . Acquired anal stenosis 09/07/2011  . Hyperlipidemia 09/07/2011  . Family history of breast cancer in sister 09/07/2011   Past Medical History:  Diagnosis Date  . 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 Anxiety   . Depression   . Full dentures   . Hemorrhoids  external  . History of radiation therapy 11/29/11 to 01/05/12   anal canal 5040 cGy 28 sessions, regional lymph nodes 4200 cGy 28 sessions  . Hyperlipidemia   . Rectal bleeding   . Vitamin D deficiency   . Wears glasses     Family History  Problem Relation Age of Onset  . Cancer Sister        breast/ age 54  . Cervical cancer Sister        59  . Alcohol abuse Maternal Uncle     Past Surgical History:  Procedure Laterality Date  . ABDOMINAL HYSTERECTOMY  1978   age 46  . EXAMINATION UNDER ANESTHESIA  06/21/2012   Procedure: EXAM UNDER ANESTHESIA;  Surgeon: Tiffany Hector,  MD;  Location: [facility];  Service: General;  Laterality: N/A;  rectal exam under anesthesia, anal dilation, rectal biopsy  . [place]   Patient had to guess on the date.  [name] RECTAL BIOPSY  06/21/2012   Procedure: BIOPSY RECTAL;  Surgeon: Tiffany Hector, MD;  Location: [facility];  Service: General;  Laterality: N/A;  . TUBAL LIGATION     b/l  age 25   Social History   Occupational History  . Not on file  Tobacco Use  . Smoking status: Former Smoker    Packs/day: 0.50    Years: 25.00    Pack years: 12.50    Types: Cigarettes    Quit date: 09/07/1987    Years since quitting: 32.0  . Smokeless tobacco: Never Used  Substance and Sexual Activity  . Alcohol use: No  . Drug use: No  . Sexual activity: Not on file

## 2019-09-12 NOTE — Telephone Encounter (Signed)
I would caution her against taking the medication together, that can be dangerous.  Only take the Tylenol with codeine or the sleeping pill at night.

## 2019-09-13 NOTE — Telephone Encounter (Signed)
IC no answer. Left detailed VM advising.

## 2019-09-19 ENCOUNTER — Telehealth: Payer: Self-pay | Admitting: Orthopedic Surgery

## 2019-09-19 ENCOUNTER — Other Ambulatory Visit: Payer: Self-pay | Admitting: Surgical

## 2019-09-19 MED ORDER — TRAMADOL HCL 50 MG PO TABS: 50.0000 mg | ORAL_TABLET | Freq: Every day | ORAL | 0 refills | Status: DC | PRN

## 2019-09-19 NOTE — Telephone Encounter (Signed)
Pt called in requesting a refill on Tylenol #3, please have that sent to [facility] on [address].

## 2019-09-19 NOTE — Telephone Encounter (Signed)
Please advise. Thanks.  

## 2019-09-19 NOTE — Telephone Encounter (Signed)
IC s/w patient and advised. She will try ultram

## 2019-10-10 ENCOUNTER — Encounter: Payer: Self-pay | Admitting: Physical Medicine and Rehabilitation

## 2019-10-10 ENCOUNTER — Other Ambulatory Visit: Payer: Self-pay

## 2019-10-10 ENCOUNTER — Ambulatory Visit: Payer: Self-pay

## 2019-10-10 ENCOUNTER — Ambulatory Visit: Payer: Medicare Other | Admitting: Physical Medicine and Rehabilitation

## 2019-10-10 VITALS — BP 142/75 | HR 90

## 2019-10-10 DIAGNOSIS — M5416 Radiculopathy, lumbar region: Secondary | ICD-10-CM

## 2019-10-10 DIAGNOSIS — M48061 Spinal stenosis, lumbar region without neurogenic claudication: Secondary | ICD-10-CM

## 2019-10-10 HISTORY — DX: Radiculopathy, lumbar region: M54.16

## 2019-10-10 MED ORDER — BETAMETHASONE SOD PHOS & ACET 6 (3-3) MG/ML IJ SUSP
12.0000 mg | Freq: Once | INTRAMUSCULAR | Status: AC
  Administered 2019-10-10: 12 mg

## 2019-10-10 NOTE — Progress Notes (Signed)
 .  Numeric Pain Rating Scale and Functional Assessment Average Pain 9   In the last MONTH (on 0-10 scale) has pain interfered with the following?  1. General activity like being  able to carry out your everyday physical activities such as walking, climbing stairs, carrying groceries, or moving a chair?  Rating(9)   +Driver, -BT, -Dye Allergies.  

## 2019-10-10 NOTE — Procedures (Signed)
S1 Lumbosacral Transforaminal Epidural Steroid Injection - Sub-Pedicular Approach with Fluoroscopic Guidance   [name]      Date of Birth: August 13, 1946 MRN: [id number] PCP: [name], MD      Visit Date: 10/10/2019   Universal Protocol:    Date/Time: 12/09/202:13 PM  Consent Given By: the patient  Position:  PRONE  Additional Comments: Vital signs were monitored before and after the procedure. Patient was prepped and draped in the usual sterile fashion. The correct patient, procedure, and site was verified.   Injection Procedure Details:  Procedure Site One Meds Administered:  Meds ordered this encounter  Medications  . betamethasone acetate-betamethasone sodium phosphate (CELESTONE) injection 12 mg    Laterality: Bilateral  Location/Site:  S1 Foramen   Needle size: 22 ga.  Needle type: Spinal  Needle Placement: Transforaminal  Findings:   -Comments: Excellent flow of contrast along the nerve and into the epidural space.  Epidurogram: Contrast epidurogram showed no nerve root cut off or restricted flow pattern.  Procedure Details: After squaring off the sacral end-plate to get a true AP view, the C-arm was positioned so that the best possible view of the S1 foramen was visualized. The soft tissues overlying this structure were infiltrated with 2-3 ml. of 1% Lidocaine without Epinephrine.    The spinal needle was inserted toward the target using a "trajectory" view along the fluoroscope beam.  Under AP and lateral visualization, the needle was advanced so it did not puncture dura. Biplanar projections were used to confirm position. Aspiration was confirmed to be negative for CSF and/or blood. A 1-2 ml. volume of Isovue-250 was injected and flow of contrast was noted at each level. Radiographs were obtained for documentation purposes.   After attaining the desired flow of contrast documented above, a 0.5 to 1.0 ml test dose of 0.25% Marcaine was  injected into each respective transforaminal space.  The patient was observed for 90 seconds post injection.  After no sensory deficits were reported, and normal lower extremity motor function was noted,   the above injectate was administered so that equal amounts of the injectate were placed at each foramen (level) into the transforaminal epidural space.   Additional Comments:  The patient tolerated the procedure well Dressing: Band-Aid with 2 x 2 sterile gauze    Post-procedure details: Patient was observed during the procedure. Post-procedure instructions were reviewed.  Patient left the clinic in stable condition.

## 2019-10-17 ENCOUNTER — Telehealth: Payer: Self-pay | Admitting: Oncology

## 2019-10-17 NOTE — Telephone Encounter (Signed)
LT PAL 1/20 lab/fu moved to 1/21. Confirmed with patient.

## 2019-11-16 ENCOUNTER — Other Ambulatory Visit: Payer: Self-pay | Admitting: Physical Medicine and Rehabilitation

## 2019-11-16 ENCOUNTER — Telehealth: Payer: Self-pay | Admitting: Physical Medicine and Rehabilitation

## 2019-11-16 MED ORDER — DICLOFENAC SODIUM 75 MG PO TBEC: 75.0000 mg | DELAYED_RELEASE_TABLET | Freq: Two times a day (BID) | ORAL | 0 refills | Status: DC

## 2019-11-16 NOTE — Telephone Encounter (Signed)
Need bilateral L5-S1 facet block, I will send in diclofenac to take BID for now (send me message) - also she needs to take tylenol ES TID scheduled.

## 2019-11-16 NOTE — Telephone Encounter (Signed)
[facility] for Diclofenac.

## 2019-11-20 NOTE — Telephone Encounter (Signed)
[id number] Injection(s), anesthetic agent and/or st more  Notification/Prior Authorization not required if procedure performed in Office; otherwise may be required for this service

## 2019-11-21 ENCOUNTER — Ambulatory Visit: Payer: Medicare Other | Admitting: Nurse Practitioner

## 2019-11-22 ENCOUNTER — Inpatient Hospital Stay: Payer: Medicare PPO | Attending: Nurse Practitioner | Admitting: Nurse Practitioner

## 2019-11-22 ENCOUNTER — Other Ambulatory Visit: Payer: Self-pay

## 2019-11-22 ENCOUNTER — Encounter: Payer: Self-pay | Admitting: Nurse Practitioner

## 2019-11-22 VITALS — BP 156/84 | HR 90 | Temp 98.0°F | Resp 17 | Ht 65.0 in | Wt 172.8 lb

## 2019-11-22 DIAGNOSIS — C801 Malignant (primary) neoplasm, unspecified: Secondary | ICD-10-CM

## 2019-11-22 DIAGNOSIS — Z85048 Personal history of other malignant neoplasm of rectum, rectosigmoid junction, and anus: Secondary | ICD-10-CM | POA: Diagnosis not present

## 2019-11-22 NOTE — Progress Notes (Signed)
  [facility] OFFICE PROGRESS NOTE   Diagnosis: Anal cancer  INTERVAL HISTORY:   [name] returns as scheduled.  Overall she feels well.  She is working 3 days a week.  She is scheduled to receive the Covid vaccine mid February.  She takes a laxative 2 times a week to keep bowel movements regular.  No blood or pain with bowel movements.  She has a good appetite.  Objective:  Vital signs in last 24 hours:  Blood pressure (!) 156/84, pulse 90, temperature 98 F (36.7 C), temperature source Temporal, resp. rate 17, height 5\' 5"  (1.651 m), weight 172 lb 12.8 oz (78.4 kg), SpO2 98 %.    HEENT: Neck without mass. Lymphatics: No palpable cervical, supraclavicular, axillary or inguinal lymph nodes. Resp: Lungs clear bilaterally. Cardio: Regular rate and rhythm. GI: Abdomen soft and nontender.  No hepatomegaly.  No mass. Rectal: Radiation changes at the perineum.  Hemorrhoid or skin tag right anal verge.  Anal stenosis which I was able to pass.  No mass. Vascular: No leg edema.   Lab Results:  Lab Results  Component Value Date   WBC 6.5 01/17/2019   HGB 11.6 (L) 01/17/2019   HCT 34.6 (L) 01/17/2019   MCV 98.3 01/17/2019   PLT 204 01/17/2019   NEUTROABS 5.0 01/17/2019    Imaging:  No results found.  Medications: I have reviewed the patient's current medications.  Assessment/Plan: 1. Poorly differentiated carcinoma, basaloid squamous cell carcinoma, of the anal canal and posterior vagina. Staging CT scans of the abdomen and pelvis on 11/15/2011 with suspicious perirectal lymph node and no evidence of distant metastatic disease. She began radiation 11/29/2011. She completed cycle 1 mitomycin and 5 fluorouracil 11/29/2011. She completed cycle 2 beginning 12/27/2011. She completed the course of radiation on 01/05/2012. 2. Rectal bleeding secondary to #1. Resolved. 3. Erythema and desquamation related to radiation. Resolved. 4. Anemia. Resolved. 5. Status post  placement right upper the PICC line 11/29/2011. The PICC line has been removed. 6. Depression.  7. Status post evaluation in the emergency Department on 02/09/2012 for shortness of breath. Chest CT was negative for evidence of a pulmonary embolism or other acute finding. She has had no further shortness of breath. 8. Rectal stenosis. She is status post an exam under anesthesia with dilatation of the anal canal and biopsies of the posterior vaginal wall and anterior anal wall canal with pathology negative for evidence of malignancy 06/21/2012. Persistent rectal stenosis. She has been evaluated by Dr. Dalbert Batman 9. Hemorrhoids 10. Colonoscopy 08/11/2016-anal stenosis.  Internal hemorrhoids.  Repeat colonoscopy in 3 years for surveillance.  Disposition:  [name] remains in clinical remission from anal cancer.  She is now approximately 8 years out from diagnosis.  She would like to continue annual follow-up at the [facility].  She will continue colonoscopy follow-up with Dr. Michail Sermon, has an appointment later today.  She will return for a follow-up visit in 1 year.  She will contact the office in the interim with any problems.  Ned Card ANP/GNP-BC   11/22/2019  9:41 AM

## 2019-11-23 ENCOUNTER — Encounter: Payer: Self-pay | Admitting: Neurology

## 2019-11-26 ENCOUNTER — Telehealth: Payer: Self-pay | Admitting: Oncology

## 2019-11-26 NOTE — Telephone Encounter (Signed)
Scheduled per los called and left msg mailed printout  

## 2019-11-28 ENCOUNTER — Encounter: Payer: Self-pay | Admitting: Physical Medicine and Rehabilitation

## 2019-11-28 ENCOUNTER — Ambulatory Visit: Payer: Self-pay

## 2019-11-28 ENCOUNTER — Other Ambulatory Visit: Payer: Self-pay

## 2019-11-28 ENCOUNTER — Ambulatory Visit (INDEPENDENT_AMBULATORY_CARE_PROVIDER_SITE_OTHER): Payer: Medicare PPO | Admitting: Physical Medicine and Rehabilitation

## 2019-11-28 VITALS — BP 125/72 | HR 75

## 2019-11-28 DIAGNOSIS — M47816 Spondylosis without myelopathy or radiculopathy, lumbar region: Secondary | ICD-10-CM

## 2019-11-28 MED ORDER — METHYLPREDNISOLONE ACETATE 80 MG/ML IJ SUSP: 40.0000 mg | Freq: Once | INTRAMUSCULAR | Status: DC

## 2019-11-28 NOTE — Progress Notes (Signed)
 .  Numeric Pain Rating Scale and Functional Assessment Average Pain 9   In the last MONTH (on 0-10 scale) has pain interfered with the following?  1. General activity like being  able to carry out your everyday physical activities such as walking, climbing stairs, carrying groceries, or moving a chair?  Rating(9)   +Driver, -BT, -Dye Allergies.  

## 2019-11-29 ENCOUNTER — Ambulatory Visit: Payer: Medicare Other

## 2019-12-06 ENCOUNTER — Ambulatory Visit: Payer: Medicare PPO | Attending: Internal Medicine

## 2019-12-06 DIAGNOSIS — Z23 Encounter for immunization: Secondary | ICD-10-CM | POA: Insufficient documentation

## 2019-12-06 NOTE — Progress Notes (Signed)
COVID-19 Vaccine Information can be found at: [email or web address] For questions related to vaccine distribution or appointments, please email [email or web address] or call [phone].    Covid-19 Vaccination Clinic  Name:  [name]    MRN: [id number] DOB: 10-29-1946  12/06/2019  [name] was observed post Covid-19 immunization for 15 minutes without incidence. She was provided with Vaccine Information Sheet and instruction to access the V-Safe system.   [name] was instructed to call 911 with any severe reactions post vaccine: [name] Difficulty breathing  . Swelling of your face and throat  . A fast heartbeat  . A bad rash all over your body  . Dizziness and weakness    Immunizations Administered    Name Date Dose VIS Date Route   Pfizer COVID-19 Vaccine 12/06/2019  8:12 AM 0.3 mL 10/12/2019 Intramuscular   Manufacturer: [place]   Lot: [id number]   [place]: [id number]

## 2019-12-16 ENCOUNTER — Ambulatory Visit: Payer: Medicare Other

## 2020-01-01 ENCOUNTER — Other Ambulatory Visit: Payer: Self-pay | Admitting: Physical Medicine and Rehabilitation

## 2020-01-01 NOTE — Telephone Encounter (Signed)
Please advise 

## 2020-01-02 ENCOUNTER — Ambulatory Visit: Payer: Medicare PPO | Attending: Internal Medicine

## 2020-01-02 DIAGNOSIS — Z23 Encounter for immunization: Secondary | ICD-10-CM

## 2020-01-02 NOTE — Progress Notes (Signed)
   Covid-19 Vaccination Clinic  Name:  [name]    MRN: [id number] DOB: 04/24/1946  01/02/2020  [name] was observed post Covid-19 immunization for 15 minutes without incident. She was provided with Vaccine Information Sheet and instruction to access the V-Safe system.   [name] was instructed to call 911 with any severe reactions post vaccine: [name] Difficulty breathing  . Swelling of face and throat  . A fast heartbeat  . A bad rash all over body  . Dizziness and weakness   Immunizations Administered    Name Date Dose VIS Date Route   Pfizer COVID-19 Vaccine 01/02/2020  8:47 AM 0.3 mL 10/12/2019 Intramuscular   Manufacturer: [place]   Lot: [id number]   [place]: [id number]

## 2020-01-03 ENCOUNTER — Ambulatory Visit: Payer: Medicare PPO | Admitting: Neurology

## 2020-01-09 ENCOUNTER — Encounter: Payer: Self-pay | Admitting: Neurology

## 2020-01-09 ENCOUNTER — Other Ambulatory Visit: Payer: Self-pay

## 2020-01-09 ENCOUNTER — Ambulatory Visit (INDEPENDENT_AMBULATORY_CARE_PROVIDER_SITE_OTHER): Payer: Medicare PPO | Admitting: Neurology

## 2020-01-09 ENCOUNTER — Ambulatory Visit: Payer: Medicare PPO | Admitting: Neurology

## 2020-01-09 VITALS — BP 122/74 | HR 80 | Ht 65.0 in | Wt 174.0 lb

## 2020-01-09 DIAGNOSIS — R200 Anesthesia of skin: Secondary | ICD-10-CM | POA: Diagnosis not present

## 2020-01-09 DIAGNOSIS — M5416 Radiculopathy, lumbar region: Secondary | ICD-10-CM

## 2020-01-09 DIAGNOSIS — M5417 Radiculopathy, lumbosacral region: Secondary | ICD-10-CM

## 2020-01-09 NOTE — Progress Notes (Signed)
[name] Clinic Note - Initial Visit   Date: 01/09/20  [name]: [id number] DOB: [birth year]   Dear Dr. Dema Severin:  Thank you for your kind referral of [name] for consultation of feet tingling. Although her history is well known to you, please allow [place] to reiterate it for the purpose of our medical record. The patient was accompanied to the clinic by self.    History of Present Illness: [name] is a 74 y.o. right-handed female with hypothyroidism, depression, anxiety, hyperlipidemia, and history of squamous cell cancer of the rectum presenting for evaluation of bilateral leg tingling and numbness.   Starting around the spring of 2020, she began having numbness/tingling in the feet which extends up to the knees.  Symptoms are intermittent and worse with prolonged standing or walking. Rest usually improves her symptoms.  Numbness/tingling is not present at night time.   She complains of imbalance which occurs only in the morning for about 30-60 min.  She denies weakness.  She has chronic back pain and has had ESI by Dr. Ernestina Patches at [facility].  MRI lumbar spine from November 2020 shows diffuse lumbar spondylosis with severe biforaminal stenosis at L5-S1.    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    Out-side paper records, electronic medical record, and images have been reviewed where available and summarized as:  MRI lumbar spine wo contrast 09/07/2019: 1. Diffuse lumbar spine spondylosis as detailed above.  Severe bilateral foraminal stenosis at L5-S1 bilaterally. 2.  No acute osseous injury of the lumbar spine. 3. No aggressive osseous lesion to suggest metastatic disease.   Past Medical History:  Diagnosis Date  . 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 Anxiety   . Depression   . Full dentures   . Hemorrhoids    external  . History of radiation therapy 11/29/11 to 01/05/12   anal  canal 5040 cGy 28 sessions, regional lymph nodes 4200 cGy 28 sessions  . Hyperlipidemia   . Rectal bleeding   . Vitamin D deficiency   . Wears glasses     Past Surgical History:  Procedure Laterality Date  . ABDOMINAL HYSTERECTOMY  1978   age 16  . EXAMINATION UNDER ANESTHESIA  06/21/2012   Procedure: EXAM UNDER ANESTHESIA;  Surgeon: [name], MD;  Location: [facility];  Service: General;  Laterality: N/A;  rectal exam under anesthesia, anal dilation, rectal biopsy  . [place]   Patient had to guess on the date.  [name] RECTAL BIOPSY  06/21/2012   Procedure: BIOPSY RECTAL;  Surgeon: [name], MD;  Location: [facility];  Service: General;  Laterality: N/A;  . TUBAL LIGATION     b/l  age 9     Medications:  Outpatient Encounter Medications as of 01/09/2020  Medication Sig Note  . acetaminophen-codeine (TYLENOL #3) 300-30 MG tablet 1 po q 12hrs prn pain   . ARIPiprazole (ABILIFY) 10 MG tablet Take 10 mg by mouth daily.   [name] aspirin EC 81 MG tablet Take 81 mg by mouth daily.   . bisacodyl (DULCOLAX) 10 MG suppository Place 1 suppository (10 mg total) rectally as needed for moderate constipation.   . Calcium Carb-Cholecalciferol (CALTRATE 600+D3 SOFT PO) Take by mouth.   . calcium carbonate (TUMS - DOSED IN MG ELEMENTAL CALCIUM) 500 MG chewable tablet Chew 1 tablet by mouth 2 (two) times daily. 02/09/2012:  Patient not sure of strength  . cholecalciferol (VITAMIN D) 1000 UNITS tablet Take 1,000 Units by mouth daily.   . cyanocobalamin 500 MCG tablet Take 500 mcg by mouth daily.   . diclofenac (VOLTAREN) 75 MG EC tablet TAKE 1 TABLET(75 MG) BY MOUTH TWICE DAILY WITH FOOD   . docusate sodium (COLACE) 100 MG capsule Take 1 capsule (100 mg total) by mouth every 12 (twelve) hours.   . DULoxetine (CYMBALTA) 60 MG capsule Take 120 mg by mouth daily.    . folic acid (FOLVITE) A999333 MCG tablet Take 400 mcg by mouth daily.   [name] gabapentin  (NEURONTIN) 600 MG tablet Take 600 mg by mouth 2 (two) times daily.   . Garlic 123XX123 MG CAPS Take by mouth daily.   [name] levothyroxine (SYNTHROID, LEVOTHROID) 25 MCG tablet Take 25 mcg by mouth daily.   . Multiple Vitamins-Minerals (CENTRUM SILVER ULTRA WOMENS PO) Take by mouth.   . Omega-3 Fatty Acids (FISH OIL) 1000 MG CAPS Take by mouth.   . polyethylene glycol (MIRALAX / GLYCOLAX) packet Take 17 g by mouth daily.   . pravastatin (PRAVACHOL) 40 MG tablet Take 40 mg by mouth daily with breakfast.    . traMADol (ULTRAM) 50 MG tablet Take 1 tablet (50 mg total) by mouth daily as needed.   . traZODone (DESYREL) 100 MG tablet Take 200 mg by mouth at bedtime.  08/10/2016: Received from: External Pharmacy  . vitamin C (ASCORBIC ACID) 500 MG tablet Take 500 mg by mouth daily.   . [DISCONTINUED] traMADol (ULTRAM) 50 MG tablet Take 1 tablet (50 mg total) by mouth daily as needed.   . [DISCONTINUED] methylPREDNISolone acetate (DEPO-MEDROL) injection 40 mg     No facility-administered encounter medications on file as of 01/09/2020.    Allergies: No Known Allergies  Family History: Family History  Problem Relation Age of Onset  . Cancer Sister        breast/ age 31  . Cervical cancer Sister        53  . Alcohol abuse Maternal Uncle     Social History: Social History   Tobacco Use  . Smoking status: Former Smoker    Packs/day: 0.50    Years: 25.00    Pack years: 12.50    Types: Cigarettes    Quit date: 09/07/1987    Years since quitting: 32.3  . Smokeless tobacco: Never Used  Substance Use Topics  . Alcohol use: No  . Drug use: No   Social History   Social History Narrative   Divorced, 1 child, works at [employer]   Right handed   Lives in a single story home alone    Vital Signs:  BP 122/74   Pulse 80   Ht 5\' 5"  (1.651 m)   Wt 174 lb (78.9 kg)   SpO2 99%   BMI 28.96 kg/m   Neurological Exam: MENTAL STATUS including orientation to time, place, person, recent and remote  memory, attention span and concentration, language, and fund of knowledge is normal.  Speech is not dysarthric.  CRANIAL NERVES: II:  No visual field defects.  III-IV-VI: Pupils equal round and reactive to light.  Normal conjugate, extra-ocular eye movements in all directions of gaze.  No nystagmus.  No ptosis.    VIII:  Normal hearing and vestibular function.     XI:  Normal shoulder shrug and head rotation.    MOTOR:  No atrophy, fasciculations or abnormal movements.  No pronator drift.   Upper Extremity:  Right  Left  Deltoid  5/5   5/5   Biceps  5/5   5/5   Triceps  5/5   5/5   Infraspinatus 5/5  5/5  Medial pectoralis 5/5  5/5  Wrist extensors  5/5   5/5   Wrist flexors  5/5   5/5   Finger extensors  5/5   5/5   Finger flexors  5/5   5/5   Dorsal interossei  5/5   5/5   Abductor pollicis  5/5   5/5   Tone (Ashworth scale)  0  0   Lower Extremity:  Right  Left  Hip flexors  5/5   5/5   Hip extensors  5/5   5/5   Adductor 5/5  5/5  Abductor 5/5  5/5  Knee flexors  5/5   5/5   Knee extensors  5/5   5/5   Dorsiflexors  5/5   5/5   Plantarflexors  5/5   5/5   Toe extensors  5/5   5/5   Toe flexors  5/5   5/5   Tone (Ashworth scale)  0  0   MSRs:  Right        Left                  brachioradialis 2+  2+  biceps 2+  2+  triceps 2+  2+  patellar 2+  2+  ankle jerk 2+  2+  Hoffman no  no  plantar response down  down   SENSORY:  Normal and symmetric perception of light touch, pinprick, vibration, and proprioception.  Romberg's sign absent.   COORDINATION/GAIT: Normal finger-to- nose-finger.  Intact rapid alternating movements bilaterally.  Able to rise from a chair without using arms.  Gait narrow based and stable. Stressed gait intact Tandem and stressed gait intact.   DATA: NCS/EMG of the legs 01/09/2020: 1. Chronic S1 radiculopathy affecting bilateral lower extremities, moderate. 2. There is no evidence of a large fiber sensorimotor polyneuropathy affecting the  lower extremities.  IMPRESSION: Chronic S1 radiculopathy manifesting with bilateral leg and feet paresthesias. Her exam shows normal distal strength, sensation, and reflexes which makes neuropathy unlikely.  To be complete, she underwent NCS/EMG of the legs today, which did not show evidence of neuropathy, and was consistent with her MRI findings of S1 radiculopathy.   - Recommend that she start physical therapy for low back strengthening and stretching - Patient has been evaluated by Dr. Marlou Sa at [facility] for her lumbar spondylosis     Thank you for allowing me to participate in patient's care.  If I can answer any additional questions, I would be pleased to do so.    Sincerely,    Katelyn Kohlmeyer K. Posey Pronto, DO

## 2020-01-09 NOTE — Procedures (Signed)
[facility]  [address]  [place], [place] [address] Tel: [phone] Fax:  [phone] Test Date:  01/09/2020  [name] DOB: [birth year] Physician: Narda Amber, DO  Sex: Female Height: 5\' 5"  Ref Phys: Narda Amber, DO  ID#: [id number] Temp: 32.0C Technician:    Patient Complaints: This is a 74 year old female referred for evaluation of bilateral feet and leg numbness and tingling.  NCV & EMG Findings: Extensive electrodiagnostic testing of the right lower extremity and additional studies of the left shows:  1. Bilateral sural and superficial peroneal sensory responses are within normal limits. 2. Bilateral peroneal motor responses are within normal limits.  Bilateral tibial motor responses show reduced amplitude (R2.9, L2.5 mV).   3. Bilateral H reflex studies are absent.   4. Chronic motor axonal loss changes are seen affecting the S1 myotome bilaterally, without accompanied active denervation.    Impression: 1. Chronic S1 radiculopathy affecting bilateral lower extremities, moderate. 2. There is no evidence of a large fiber sensorimotor polyneuropathy affecting the lower extremities.   ___________________________ Narda Amber, DO    Nerve Conduction Studies Anti Sensory Summary Table   Stim Site NR Peak (ms) Norm Peak (ms) P-T Amp (V) Norm P-T Amp  Left Sup Peroneal Anti Sensory (Ant Lat Mall)  32C  12 cm    3.0 <4.6 8.7 >3  Right Sup Peroneal Anti Sensory (Ant Lat Mall)  32C  12 cm    3.0 <4.6 7.9 >3  Left Sural Anti Sensory (Lat Mall)  32C  Calf    2.8 <4.6 8.8 >3  Right Sural Anti Sensory (Lat Mall)  32C  Calf    3.6 <4.6 9.7 >3   Motor Summary Table   Stim Site NR Onset (ms) Norm Onset (ms) O-P Amp (mV) Norm O-P Amp Site1 Site2 Delta-0 (ms) Dist (cm) Vel (m/s) Norm Vel (m/s)  Left Peroneal Motor (Ext Dig Brev)  32C  Ankle    4.8 <6.0 3.2 >2.5 B Fib Ankle 7.5 35.0 47 >40  B Fib    12.3  2.8  Poplt B Fib 1.4 8.0 57  >40  Poplt    13.7  2.8         Right Peroneal Motor (Ext Dig Brev)  32C  Ankle    4.1 <6.0 2.8 >2.5 B Fib Ankle 7.5 36.0 48 >40  B Fib    11.6  2.8  Poplt B Fib 1.6 8.0 50 >40  Poplt    13.2  2.7         Left Tibial Motor (Abd Hall Brev)  32C  Ankle    4.2 <6.0 2.5 >4 Knee Ankle 8.8 38.0 43 >40  Knee    13.0  2.0         Right Tibial Motor (Abd Hall Brev)  32C  Ankle    3.2 <6.0 2.9 >4 Knee Ankle 8.8 40.0 45 >40  Knee    12.0  2.0          H Reflex Studies   NR H-Lat (ms) Lat Norm (ms) L-R H-Lat (ms)  Left Tibial (Gastroc)  32C  NR  <35   Right Tibial (Gastroc)  32C  NR  <35    EMG   Side Muscle Ins Act Fibs Psw Fasc Number Recrt Dur Dur. Amp Amp. Poly Poly. Comment  Right AntTibialis Nml Nml Nml Nml Nml Nml Nml Nml Nml Nml Nml Nml N/A  Right Gastroc Nml Nml Nml Nml 1- Rapid  Some 1+ Some 1+ Some 1+ N/A  Right Flex Dig Long Nml Nml Nml Nml Nml Nml Nml Nml Nml Nml Nml Nml N/A  Right RectFemoris Nml Nml Nml Nml Nml Nml Nml Nml Nml Nml Nml Nml N/A  Right GluteusMed Nml Nml Nml Nml Nml Nml Nml Nml Nml Nml Nml Nml N/A  Left BicepsFemS Nml Nml Nml Nml 1- Rapid Some 1+ Some 1+ Some 1+ N/A  Right BicepsFemS Nml Nml Nml Nml 1- Rapid Some 1+ Some 1+ Some 1+ N/A  Left AntTibialis Nml Nml Nml Nml Nml Nml Nml Nml Nml Nml Nml Nml N/A  Left Gastroc Nml Nml Nml Nml 1- Rapid Some 1+ Some 1+ Some 1+ N/A  Left Flex Dig Long Nml Nml Nml Nml Nml Nml Nml Nml Nml Nml Nml Nml N/A  Left RectFemoris Nml Nml Nml Nml Nml Nml Nml Nml Nml Nml Nml Nml N/A  Left GluteusMed Nml Nml Nml Nml Nml Nml Nml Nml Nml Nml Nml Nml N/A      Waveforms:

## 2020-01-09 NOTE — Patient Instructions (Addendum)
Start physical therapy for low back stretching and strengthening to help the tingling in your feet and legs. [facility] [place], [place], [place] [address] [phone]   There is no evidence of neuropathy on your exam or testing today.

## 2020-01-15 NOTE — Progress Notes (Signed)
[name] - 74 y.o. female MRN [id number]  Date of birth: 1946-09-27  Office Visit Note: Visit Date: 11/28/2019 PCP: [name], MD Referred by: [name], MD  Subjective: Chief Complaint  Patient presents with  . Lower Back - Pain   HPI:  [name] is a 74 y.o. female who comes in today Patient returns today after bilateral S1 transforaminal injection on 10/10/2019.  She reports is helped out a great deal of leg pain but she still having a lot of pain across the back pain.  She says that approximately 2 weeks ago this really started to get worse.  She feels like the injection is wearing off.  We had a general discussion about the fact that the cortisone medicine does not really wear off its window things really get aggravated once again as they do not really solve the problem per se.  They can last quite a while if things get calm down and she is doing well and we had a long talk about this.  I also suggested that the epidural injection treats more of the nerve issue or nerve root issue but did not really treat the arthritic changes at L5-S1.  I think the next best step is injection of the facet joints at L5-S1 to see if that helps her back pain.  We could look at a double block paradigm and radiofrequency ablation of the same joints.  If this did not help her back pain I would look at transforaminal action where she has pretty significant stenosis.  She also tells me that she has an order in to have electrodiagnostic study performed of the lower limbs.  We will wait for details on that as well.  ROS Otherwise per HPI.  Assessment & Plan: Visit Diagnoses:  1. Spondylosis without myelopathy or radiculopathy, lumbar region     Plan: No additional findings.   Meds & Orders:  Meds ordered this encounter  Medications  . DISCONTD: methylPREDNISolone acetate (DEPO-MEDROL) injection 40 mg    Orders Placed This Encounter  Procedures  . Facet Injection  . XR C-ARM NO REPORT     Follow-up: Return if symptoms worsen or fail to improve.   Procedures: No procedures performed  Lumbar Facet Joint Intra-Articular Injection(s) with Fluoroscopic Guidance  [name]      Date of Birth: October 08, 1946 MRN: [id number] PCP: [name], MD      Visit Date: 11/28/2019   Universal Protocol:    Date/Time: 11/28/2019  Consent Given By: the patient  Position: PRONE   Additional Comments: Vital signs were monitored before and after the procedure. Patient was prepped and draped in the usual sterile fashion. The correct patient, procedure, and site was verified.   Injection Procedure Details:  Procedure Site One Meds Administered:  Meds ordered this encounter  Medications  . DISCONTD: methylPREDNISolone acetate (DEPO-MEDROL) injection 40 mg     Laterality: Bilateral  Location/Site:  L5-S1  Needle size: 22 guage  Needle type: Spinal  Needle Placement: Articular  Findings:  -Comments: Excellent flow of contrast producing a partial arthrogram.  Procedure Details: The fluoroscope beam is vertically oriented in AP, and the inferior recess is visualized beneath the lower pole of the inferior apophyseal process, which represents the target point for needle insertion. When direct visualization is difficult the target point is located at the medial projection of the vertebral pedicle. The region overlying each aforementioned target is locally anesthetized with a 1 to 2 ml. volume  of 1% Lidocaine without Epinephrine.   The spinal needle was inserted into each of the above mentioned facet joints using biplanar fluoroscopic guidance. A 0.25 to 0.5 ml. volume of Isovue-250 was injected and a partial facet joint arthrogram was obtained. A single spot film was obtained of the resulting arthrogram.    One to 1.25 ml of the steroid/anesthetic solution was then injected into each of the facet joints noted above.   Additional Comments:  The patient tolerated  the procedure well Dressing: 2 x 2 sterile gauze and Band-Aid    Post-procedure details: Patient was observed during the procedure. Post-procedure instructions were reviewed.  Patient left the clinic in stable condition.     Clinical History: Segmentation:  Standard.  Alignment: Minimal grade 1 anterolisthesis of L4 on L5 and L5 on S1.  Vertebrae: No acute fracture, discitis-osteomyelitis, or aggressive bone lesion. Chronic T10, T11, L1, L3 and L5 vertebral body compression fractures. No focal marrow signal abnormality.  Conus medullaris and cauda equina: Conus extends to the T12-L1 level. Conus and cauda equina appear normal.  Paraspinal and other soft tissues: No acute paraspinal abnormality.  Disc levels:  Disc spaces: Degenerative disease with disc height loss at L5-S1. Disc desiccation at L1-2, L2-3, L3-4 and L4-5.  T12-L1: Broad-based disc bulge mildly flattening the ventral thecal sac. Mild bilateral facet arthropathy. No evidence of neural foraminal stenosis. No central canal stenosis.  L1-L2: Mild broad-based disc bulge. Mild bilateral facet arthropathy. No evidence of neural foraminal stenosis. No central canal stenosis.  L2-L3: Mild broad-based disc bulge. Moderate bilateral facet arthropathy. Mild bilateral foraminal stenosis. Mild spinal stenosis.  L3-L4: Mild broad-based disc bulge flattening the ventral thecal sac. Moderate bilateral facet arthropathy with ligamentum flavum infolding. No evidence of neural foraminal stenosis. No central canal stenosis.  L4-L5: Broad-based disc bulge. Ankylosis of the posterior elements at L4-5 with hypertrophic changes. Mild bilateral foraminal stenosis. Mild spinal stenosis.  L5-S1: Broad-based disc bulge. Severe bilateral facet arthropathy with bilateral facet effusions. Bilateral subarticular recess stenosis. Severe left and moderate right foraminal stenosis.  IMPRESSION: 1. Diffuse lumbar spine  spondylosis as detailed above. 2.  No acute osseous injury of the lumbar spine. 3. No aggressive osseous lesion to suggest metastatic disease.   Electronically Signed   By: Kathreen Devoid   On: 09/07/2019 08:47     Objective:  VS:  HT:    WT:   BMI:     BP:125/72  HR:75bpm  TEMP: ( )  RESP:  Physical Exam  Ortho Exam Imaging: No results found.

## 2020-01-15 NOTE — Procedures (Signed)
Lumbar Facet Joint Intra-Articular Injection(s) with Fluoroscopic Guidance  [name]      Date of Birth: 02-14-1946 MRN: [id number] PCP: [name], MD      Visit Date: 11/28/2019   Universal Protocol:    Date/Time: 11/28/2019  Consent Given By: the patient  Position: PRONE   Additional Comments: Vital signs were monitored before and after the procedure. Patient was prepped and draped in the usual sterile fashion. The correct patient, procedure, and site was verified.   Injection Procedure Details:  Procedure Site One Meds Administered:  Meds ordered this encounter  Medications  . DISCONTD: methylPREDNISolone acetate (DEPO-MEDROL) injection 40 mg     Laterality: Bilateral  Location/Site:  L5-S1  Needle size: 22 guage  Needle type: Spinal  Needle Placement: Articular  Findings:  -Comments: Excellent flow of contrast producing a partial arthrogram.  Procedure Details: The fluoroscope beam is vertically oriented in AP, and the inferior recess is visualized beneath the lower pole of the inferior apophyseal process, which represents the target point for needle insertion. When direct visualization is difficult the target point is located at the medial projection of the vertebral pedicle. The region overlying each aforementioned target is locally anesthetized with a 1 to 2 ml. volume of 1% Lidocaine without Epinephrine.   The spinal needle was inserted into each of the above mentioned facet joints using biplanar fluoroscopic guidance. A 0.25 to 0.5 ml. volume of Isovue-250 was injected and a partial facet joint arthrogram was obtained. A single spot film was obtained of the resulting arthrogram.    One to 1.25 ml of the steroid/anesthetic solution was then injected into each of the facet joints noted above.   Additional Comments:  The patient tolerated the procedure well Dressing: 2 x 2 sterile gauze and Band-Aid    Post-procedure details: Patient was  observed during the procedure. Post-procedure instructions were reviewed.  Patient left the clinic in stable condition.

## 2020-01-15 NOTE — Progress Notes (Signed)
[name] - 74 y.o. female MRN [id number]  Date of birth: August 03, 1946  Office Visit Note: Visit Date: 10/10/2019 PCP: [name], MD Referred by: [name], MD  Subjective: Chief Complaint  Patient presents with  . Lower Back - Pain  . Right Leg - Pain, Numbness, Tingling  . Left Leg - Pain, Numbness, Tingling   HPI: [name] is a 74 y.o. female who comes in today For interventional spine procedure at the request of Dr. Anderson Malta.  Patient comes in today with chronic worsening severe low back pain with bilateral radicular leg pain down to the calves and foot.  This is more of an S1 distribution posteriorly.  MRI has been completed and reviewed today with the patient.  She has multilevel spondylosis and facet arthropathy with broad bulging at L5-S1 with a combination of pretty significant facet arthritis causing lateral recess narrowing at that level that could give her similar complaints and symptoms.  No high-grade central stenosis.  She does have foraminal narrowing at L5 but her symptoms are not classically L5 symptoms.  She has failed conservative care otherwise.  We will complete bilateral S1 transforaminal injection.  ROS Otherwise per HPI.  Assessment & Plan: Visit Diagnoses:  1. Lumbar radiculopathy   2. Bilateral stenosis of lateral recess of lumbar spine     Plan: No additional findings.   Meds & Orders:  Meds ordered this encounter  Medications  . betamethasone acetate-betamethasone sodium phosphate (CELESTONE) injection 12 mg    Orders Placed This Encounter  Procedures  . XR C-ARM NO REPORT  . Epidural Steroid injection    Follow-up: Return for visit to requesting physician as needed.   Procedures: No procedures performed  S1 Lumbosacral Transforaminal Epidural Steroid Injection - Sub-Pedicular Approach with Fluoroscopic Guidance   [name]      Date of Birth: [birth year] MRN: [id number] PCP: [name],  MD      Visit Date: 10/10/2019   Universal Protocol:    Date/Time: 12/09/202:13 PM  Consent Given By: the patient  Position:  PRONE  Additional Comments: Vital signs were monitored before and after the procedure. Patient was prepped and draped in the usual sterile fashion. The correct patient, procedure, and site was verified.   Injection Procedure Details:  Procedure Site One Meds Administered:  Meds ordered this encounter  Medications  . betamethasone acetate-betamethasone sodium phosphate (CELESTONE) injection 12 mg    Laterality: Bilateral  Location/Site:  S1 Foramen   Needle size: 22 ga.  Needle type: Spinal  Needle Placement: Transforaminal  Findings:   -Comments: Excellent flow of contrast along the nerve and into the epidural space.  Epidurogram: Contrast epidurogram showed no nerve root cut off or restricted flow pattern.  Procedure Details: After squaring off the sacral end-plate to get a true AP view, the C-arm was positioned so that the best possible view of the S1 foramen was visualized. The soft tissues overlying this structure were infiltrated with 2-3 ml. of 1% Lidocaine without Epinephrine.    The spinal needle was inserted toward the target using a "trajectory" view along the fluoroscope beam.  Under AP and lateral visualization, the needle was advanced so it did not puncture dura. Biplanar projections were used to confirm position. Aspiration was confirmed to be negative for CSF and/or blood. A 1-2 ml. volume of Isovue-250 was injected and flow of contrast was noted at each level. Radiographs were obtained for documentation purposes.   After attaining the  desired flow of contrast documented above, a 0.5 to 1.0 ml test dose of 0.25% Marcaine was injected into each respective transforaminal space.  The patient was observed for 90 seconds post injection.  After no sensory deficits were reported, and normal lower extremity motor function was noted,   the  above injectate was administered so that equal amounts of the injectate were placed at each foramen (level) into the transforaminal epidural space.   Additional Comments:  The patient tolerated the procedure well Dressing: Band-Aid with 2 x 2 sterile gauze    Post-procedure details: Patient was observed during the procedure. Post-procedure instructions were reviewed.  Patient left the clinic in stable condition.    Clinical History: Segmentation:  Standard.  Alignment: Minimal grade 1 anterolisthesis of L4 on L5 and L5 on S1.  Vertebrae: No acute fracture, discitis-osteomyelitis, or aggressive bone lesion. Chronic T10, T11, L1, L3 and L5 vertebral body compression fractures. No focal marrow signal abnormality.  Conus medullaris and cauda equina: Conus extends to the T12-L1 level. Conus and cauda equina appear normal.  Paraspinal and other soft tissues: No acute paraspinal abnormality.  Disc levels:  Disc spaces: Degenerative disease with disc height loss at L5-S1. Disc desiccation at L1-2, L2-3, L3-4 and L4-5.  T12-L1: Broad-based disc bulge mildly flattening the ventral thecal sac. Mild bilateral facet arthropathy. No evidence of neural foraminal stenosis. No central canal stenosis.  L1-L2: Mild broad-based disc bulge. Mild bilateral facet arthropathy. No evidence of neural foraminal stenosis. No central canal stenosis.  L2-L3: Mild broad-based disc bulge. Moderate bilateral facet arthropathy. Mild bilateral foraminal stenosis. Mild spinal stenosis.  L3-L4: Mild broad-based disc bulge flattening the ventral thecal sac. Moderate bilateral facet arthropathy with ligamentum flavum infolding. No evidence of neural foraminal stenosis. No central canal stenosis.  L4-L5: Broad-based disc bulge. Ankylosis of the posterior elements at L4-5 with hypertrophic changes. Mild bilateral foraminal stenosis. Mild spinal stenosis.  L5-S1: Broad-based disc bulge.  Severe bilateral facet arthropathy with bilateral facet effusions. Bilateral subarticular recess stenosis. Severe left and moderate right foraminal stenosis.  IMPRESSION: 1. Diffuse lumbar spine spondylosis as detailed above. 2.  No acute osseous injury of the lumbar spine. 3. No aggressive osseous lesion to suggest metastatic disease.   Electronically Signed   By: Kathreen Devoid   On: 09/07/2019 08:47   She reports that she quit smoking about 32 years ago. Her smoking use included cigarettes. She has a 12.50 pack-year smoking history. She has never used smokeless tobacco. No results for input(s): HGBA1C, LABURIC in the last 8760 hours.  Objective:  VS:  HT:    WT:   BMI:     BP:(!) 142/75  HR:90bpm  TEMP: ( )  RESP:  Physical Exam  Ortho Exam Imaging: No results found.  Past Medical/Family/Surgical/Social History: Medications & Allergies reviewed per EMR, new medications updated. Patient Active Problem List   Diagnosis Date Noted  . Lumbar radiculopathy 10/10/2019  . Bilateral stenosis of lateral recess of lumbar spine 10/10/2019  . Hemorrhoids, external without complications 0000000  . Depression   . Anxiety   . Rectal bleeding   . History of radiation therapy   . Malignant neoplasm of anal canal ([place]) 11/15/2011  . Hemorrhoids, internal, with bleeding 09/07/2011  . Acquired anal stenosis 09/07/2011  . Hyperlipidemia 09/07/2011  . Family history of breast cancer in sister 09/07/2011   Past Medical History:  Diagnosis Date  . 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 Anxiety   .  Depression   . Full dentures   . Hemorrhoids    external  . History of radiation therapy 11/29/11 to 01/05/12   anal canal 5040 cGy 28 sessions, regional lymph nodes 4200 cGy 28 sessions  . Hyperlipidemia   . Rectal bleeding   . Vitamin D deficiency   . Wears glasses    Family History  Problem Relation Age of Onset  . Cancer Sister        breast/ age 37  . Cervical  cancer Sister        45  . Alcohol abuse Maternal Uncle    Past Surgical History:  Procedure Laterality Date  . ABDOMINAL HYSTERECTOMY  1978   age 82  . EXAMINATION UNDER ANESTHESIA  06/21/2012   Procedure: EXAM UNDER ANESTHESIA;  Surgeon: [name], MD;  Location: [facility];  Service: General;  Laterality: N/A;  rectal exam under anesthesia, anal dilation, rectal biopsy  . [facility] [place]   Patient had to guess on the date.  [name] RECTAL BIOPSY  06/21/2012   Procedure: BIOPSY RECTAL;  Surgeon: [name], MD;  Location: [facility];  Service: General;  Laterality: N/A;  . TUBAL LIGATION     b/l  age 33   Social History   Occupational History  . Not on file  Tobacco Use  . Smoking status: Former Smoker    Packs/day: 0.50    Years: 25.00    Pack years: 12.50    Types: Cigarettes    Quit date: 09/07/1987    Years since quitting: 32.3  . Smokeless tobacco: Never Used  Substance and Sexual Activity  . Alcohol use: No  . Drug use: No  . Sexual activity: Not on file

## 2020-02-06 DIAGNOSIS — Z0000 Encounter for general adult medical examination without abnormal findings: Secondary | ICD-10-CM | POA: Diagnosis not present

## 2020-02-27 DIAGNOSIS — Z6830 Body mass index (BMI) 30.0-30.9, adult: Secondary | ICD-10-CM | POA: Diagnosis not present

## 2020-02-27 DIAGNOSIS — Z01419 Encounter for gynecological examination (general) (routine) without abnormal findings: Secondary | ICD-10-CM | POA: Diagnosis not present

## 2020-02-27 DIAGNOSIS — Z1231 Encounter for screening mammogram for malignant neoplasm of breast: Secondary | ICD-10-CM | POA: Diagnosis not present

## 2020-03-05 ENCOUNTER — Other Ambulatory Visit: Payer: Self-pay | Admitting: Physical Medicine and Rehabilitation

## 2020-03-05 NOTE — Telephone Encounter (Signed)
She need to have this Rx from PCP if taking more than a month. Also looks like she had EMG/Ncs by Dr. Posey Pronto showing moderate chronic S1 Radic. She might want re-injection vs seeing spine surgeon

## 2020-03-05 NOTE — Telephone Encounter (Signed)
Please advise 

## 2020-03-07 NOTE — Telephone Encounter (Signed)
Called patient to advise to call PCP for refill. Also schedule patient for ESI.

## 2020-04-02 ENCOUNTER — Ambulatory Visit: Payer: Medicare PPO | Admitting: Physical Medicine and Rehabilitation

## 2020-04-02 ENCOUNTER — Encounter: Payer: Self-pay | Admitting: Physical Medicine and Rehabilitation

## 2020-04-02 ENCOUNTER — Ambulatory Visit: Payer: Self-pay

## 2020-04-02 ENCOUNTER — Other Ambulatory Visit: Payer: Self-pay

## 2020-04-02 VITALS — BP 135/81 | HR 72

## 2020-04-02 DIAGNOSIS — M48061 Spinal stenosis, lumbar region without neurogenic claudication: Secondary | ICD-10-CM

## 2020-04-02 DIAGNOSIS — M5416 Radiculopathy, lumbar region: Secondary | ICD-10-CM

## 2020-04-02 MED ORDER — METHYLPREDNISOLONE ACETATE 80 MG/ML IJ SUSP
80.0000 mg | Freq: Once | INTRAMUSCULAR | Status: DC
Start: 2020-04-02 — End: 2020-08-27

## 2020-04-02 NOTE — Progress Notes (Addendum)
[name] - 74 y.o. female MRN [id number]  Date of birth: April 21, 1946  Office Visit Note: Visit Date: 04/02/2020 PCP: [name], MD Referred by: [name], MD  Subjective: Chief Complaint  Patient presents with  . Lower Back - Follow-up    ESI   HPI:  [name] is a 74 y.o. female who comes in today at the request of Dr. Anderson Malta for planned Right S1-2 Lumbar epidural steroid injection with fluoroscopic guidance.  The patient has failed conservative care including home exercise, medications, time and activity modification.  This injection will be diagnostic and hopefully therapeutic.  Please see requesting physician notes for further details and justification.   ROS Otherwise per HPI.  Assessment & Plan: Visit Diagnoses:  1. Lumbar radiculopathy   2. Bilateral stenosis of lateral recess of lumbar spine     Plan: No additional findings.   Meds & Orders:  Meds ordered this encounter  Medications  . DISCONTD: methylPREDNISolone acetate (DEPO-MEDROL) injection 80 mg    Orders Placed This Encounter  Procedures  . XR C-ARM NO REPORT  . Epidural Steroid injection    Follow-up: Return if symptoms worsen or fail to improve.   Procedures: No procedures performed  S1 Lumbosacral Transforaminal Epidural Steroid Injection - Sub-Pedicular Approach with Fluoroscopic Guidance   [name]      Date of Birth: 03/18/1946 MRN: [id number] PCP: [name], MD      Visit Date: 04/02/2020   Universal Protocol:    Date/Time: 08/28/2111:58 PM  Consent Given By: the patient  Position:  PRONE  Additional Comments: Vital signs were monitored before and after the procedure. Patient was prepped and draped in the usual sterile fashion. The correct patient, procedure, and site was verified.   Injection Procedure Details:  Procedure Site One Meds Administered:  Meds ordered this encounter  Medications  . DISCONTD: methylPREDNISolone acetate  (DEPO-MEDROL) injection 80 mg    Laterality: Right  Location/Site:  S1 Foramen   Needle size: 22 ga.  Needle type: Spinal  Needle Placement: Transforaminal  Findings:   -Comments: Excellent flow of contrast along the nerve and into the epidural space.  Epidurogram: Contrast epidurogram showed no nerve root cut off or restricted flow pattern.  Procedure Details: After squaring off the sacral end-plate to get a true AP view, the C-arm was positioned so that the best possible view of the S1 foramen was visualized. The soft tissues overlying this structure were infiltrated with 2-3 ml. of 1% Lidocaine without Epinephrine.    The spinal needle was inserted toward the target using a "trajectory" view along the fluoroscope beam.  Under AP and lateral visualization, the needle was advanced so it did not puncture dura. Biplanar projections were used to confirm position. Aspiration was confirmed to be negative for CSF and/or blood. A 1-2 ml. volume of Isovue-250 was injected and flow of contrast was noted at each level. Radiographs were obtained for documentation purposes.   After attaining the desired flow of contrast documented above, a 0.5 to 1.0 ml test dose of 0.25% Marcaine was injected into each respective transforaminal space.  The patient was observed for 90 seconds post injection.  After no sensory deficits were reported, and normal lower extremity motor function was noted,   the above injectate was administered so that equal amounts of the injectate were placed at each foramen (level) into the transforaminal epidural space.   Additional Comments:  The patient tolerated the procedure well Dressing:  Band-Aid with 2 x 2 sterile gauze    Post-procedure details: Patient was observed during the procedure. Post-procedure instructions were reviewed.  Patient left the clinic in stable condition.     Clinical History: [facility]  [place], [address]   [place], [place] [address] Tel: [phone] Fax:  [phone] Test Date:  01/09/2020  [name] DOB: 1946/10/22 Physician: Narda Amber, DO Sex: Female Height: 5\' 5"  Ref Phys: Narda Amber, DO ID#: [id number] Temp: 32.0C Technician:   Patient Complaints: This is a 74 year old female referred for evaluation of bilateral feet and leg numbness and tingling.  NCV & EMG Findings: Extensive electrodiagnostic testing of the right lower extremity and additional studies of the left shows:  1. Bilateral sural and superficial peroneal sensory responses are within normal limits. 2. Bilateral peroneal motor responses are within normal limits.  Bilateral tibial motor responses show reduced amplitude (R2.9, L2.5 mV).   3. Bilateral H reflex studies are absent.   4. Chronic motor axonal loss changes are seen affecting the S1 myotome bilaterally, without accompanied active denervation.    Impression: 1. Chronic S1 radiculopathy affecting bilateral lower extremities, moderate. 2. There is no evidence of a large fiber sensorimotor polyneuropathy affecting the lower extremities.   --- Segmentation: Standard.    Alignment: Minimal grade 1 anterolisthesis of L4 on L5 and L5 on S1.    Vertebrae: No acute fracture, discitis-osteomyelitis, or aggressive  bone lesion. Chronic T10, T11, L1, L3 and L5 vertebral body  compression fractures. No focal marrow signal abnormality.    Conus medullaris and cauda equina: Conus extends to the T12-L1  level. Conus and cauda equina appear normal.    Paraspinal and other soft tissues: No acute paraspinal abnormality.    Disc levels:    Disc spaces: Degenerative disease with disc height loss at L5-S1.  Disc desiccation at L1-2, L2-3, L3-4 and L4-5.    T12-L1: Broad-based disc bulge mildly flattening the ventral thecal  sac. Mild bilateral facet arthropathy. No evidence of neural  foraminal stenosis. No central canal stenosis.    L1-L2:  Mild broad-based disc bulge. Mild bilateral facet  arthropathy. No evidence of neural foraminal stenosis. No central  canal stenosis.    L2-L3: Mild broad-based disc bulge. Moderate bilateral facet  arthropathy. Mild bilateral foraminal stenosis. Mild spinal  stenosis.    L3-L4: Mild broad-based disc bulge flattening the ventral thecal  sac. Moderate bilateral facet arthropathy with ligamentum flavum  infolding. No evidence of neural foraminal stenosis. No central  canal stenosis.    L4-L5: Broad-based disc bulge. Ankylosis of the posterior elements  at L4-5 with hypertrophic changes. Mild bilateral foraminal  stenosis. Mild spinal stenosis.    L5-S1: Broad-based disc bulge. Severe bilateral facet arthropathy  with bilateral facet effusions. Bilateral subarticular recess  stenosis. Severe left and moderate right foraminal stenosis.    IMPRESSION:  1. Diffuse lumbar spine spondylosis as detailed above.  2. No acute osseous injury of the lumbar spine.  3. No aggressive osseous lesion to suggest metastatic disease.      Electronically Signed  By: Kathreen Devoid  On: 09/07/2019 08:47     Objective:  VS:  HT:    WT:   BMI:     BP:135/81  HR:72bpm  TEMP: ( )  RESP:97 % Physical Exam   Imaging: No results found.

## 2020-04-02 NOTE — Progress Notes (Signed)
Numeric Pain Rating Scale and Functional Assessment Average Pain 10   In the last MONTH (on 0-10 scale) has pain interfered with the following?  1. General activity like being  able to carry out your everyday physical activities such as walking, climbing stairs, carrying groceries, or moving a chair?  Rating(10)  Pain is in lower back that radiates down into the right leg and right knee.  Numbness/tingling in right leg.  Hurts to walk and stand.  Takes a baby Aspirin.  +Driver, -BT, -Dye Allergies.

## 2020-04-03 ENCOUNTER — Other Ambulatory Visit: Payer: Self-pay | Admitting: Physical Medicine and Rehabilitation

## 2020-04-03 ENCOUNTER — Telehealth: Payer: Self-pay | Admitting: Physical Medicine and Rehabilitation

## 2020-04-03 MED ORDER — DICLOFENAC SODIUM 75 MG PO TBEC: DELAYED_RELEASE_TABLET | ORAL | 0 refills | Status: DC

## 2020-04-03 NOTE — Telephone Encounter (Signed)
Patient called.   She needs her Diclofenac called in   Call back: [phone]

## 2020-04-03 NOTE — Telephone Encounter (Signed)
I did refill but this become something she uses consistently she will need to follow-up with her primary care physician for management of this medication.

## 2020-04-03 NOTE — Telephone Encounter (Signed)
Please advise 

## 2020-04-03 NOTE — Telephone Encounter (Signed)
Called pt and advised.  

## 2020-04-23 DIAGNOSIS — H2513 Age-related nuclear cataract, bilateral: Secondary | ICD-10-CM | POA: Diagnosis not present

## 2020-04-23 DIAGNOSIS — H524 Presbyopia: Secondary | ICD-10-CM | POA: Diagnosis not present

## 2020-04-23 DIAGNOSIS — H5203 Hypermetropia, bilateral: Secondary | ICD-10-CM | POA: Diagnosis not present

## 2020-04-23 DIAGNOSIS — H52223 Regular astigmatism, bilateral: Secondary | ICD-10-CM | POA: Diagnosis not present

## 2020-04-23 DIAGNOSIS — H11153 Pinguecula, bilateral: Secondary | ICD-10-CM | POA: Diagnosis not present

## 2020-04-30 DIAGNOSIS — E039 Hypothyroidism, unspecified: Secondary | ICD-10-CM | POA: Diagnosis not present

## 2020-04-30 DIAGNOSIS — Z791 Long term (current) use of non-steroidal anti-inflammatories (NSAID): Secondary | ICD-10-CM | POA: Diagnosis not present

## 2020-04-30 DIAGNOSIS — M1711 Unilateral primary osteoarthritis, right knee: Secondary | ICD-10-CM | POA: Diagnosis not present

## 2020-04-30 DIAGNOSIS — M5136 Other intervertebral disc degeneration, lumbar region: Secondary | ICD-10-CM | POA: Diagnosis not present

## 2020-05-06 ENCOUNTER — Telehealth: Payer: Self-pay | Admitting: Physical Medicine and Rehabilitation

## 2020-05-06 NOTE — Telephone Encounter (Signed)
Patient called. She would like to speak with [place] or [place]. Her call back number is [phone]

## 2020-05-06 NOTE — Telephone Encounter (Signed)
error 

## 2020-05-06 NOTE — Telephone Encounter (Signed)
Pt would like to be scheduled for her lower back; best time to call is @ 4.   [phone]

## 2020-05-06 NOTE — Telephone Encounter (Signed)
Patient called. She missed a call to schedule appointment with Dr. Ernestina Patches. Her call back number os [phone]

## 2020-05-07 ENCOUNTER — Telehealth: Payer: Self-pay | Admitting: Physical Medicine and Rehabilitation

## 2020-05-07 NOTE — Telephone Encounter (Signed)
Patient called. She would like an appointment with Dr. Ernestina Patches. Her call back number is [phone]

## 2020-05-07 NOTE — Telephone Encounter (Signed)
Please advise 

## 2020-05-07 NOTE — Telephone Encounter (Signed)
Pt called asking to repeat injection verified with pt that S1 TF on 6\2\2021 helped the most. Ok to repeat if no injuries/ traumas? Please advise.

## 2020-05-09 NOTE — Telephone Encounter (Signed)
Approved Authorization #[id number] (Tracking [id number]) This authorization will be valid for 30 days (05/26/2020 - 06/25/2020). No action is needed if the service occurs between these dates.

## 2020-05-09 NOTE — Telephone Encounter (Signed)
Ok to repeat 

## 2020-05-09 NOTE — Telephone Encounter (Signed)
duplicate

## 2020-05-14 ENCOUNTER — Encounter: Payer: Self-pay | Admitting: Orthopedic Surgery

## 2020-05-14 ENCOUNTER — Ambulatory Visit (INDEPENDENT_AMBULATORY_CARE_PROVIDER_SITE_OTHER): Payer: Medicare PPO

## 2020-05-14 ENCOUNTER — Ambulatory Visit: Payer: Medicare PPO | Admitting: Orthopedic Surgery

## 2020-05-14 DIAGNOSIS — G8929 Other chronic pain: Secondary | ICD-10-CM

## 2020-05-14 DIAGNOSIS — M25561 Pain in right knee: Secondary | ICD-10-CM

## 2020-05-14 DIAGNOSIS — M1711 Unilateral primary osteoarthritis, right knee: Secondary | ICD-10-CM

## 2020-05-24 ENCOUNTER — Encounter: Payer: Self-pay | Admitting: Orthopedic Surgery

## 2020-05-24 NOTE — Progress Notes (Signed)
Office Visit Note   [name]           Date of Birth: January 24, 1946           MRN: [id number] Visit Date: 05/14/2020 Requested by: [name], MD [place],  [place] [address] PCP: [name], MD  Subjective: Chief Complaint  Patient presents with  . Lower Back - Follow-up    HPI: [name] is a 74 y.o. female who presents to the office complaining of right knee pain.States that she had an injection by Dr. Ernestina Patches into her lumbar spine on 04/02/2020 that was helpful for about 1 month with her right side low back pain and her right leg pain.  She does note that her right knee pain never had any improvement from the lumbar spine ESI.  She localizes pain to the anterior aspect of the right knee.  She notes clicking of the right knee without any instability.  She has never had surgery on her right knee.  Pain is been going on for greater than 6 months.  She denies any groin pain.  She has an appoint with Dr. Ernestina Patches for lumbar spine ESI repeat in August.  She takes diclofenac for some of her pain with some relief..                ROS: All systems reviewed are negative as they relate to the chief complaint within the history of present illness.  Patient denies fevers or chills.  Assessment & Plan: Visit Diagnoses:  1. Chronic pain of right knee   2. Unilateral primary osteoarthritis, right knee     Plan: Patient is a 74 year old female presents complaining of right knee pain.  Pain is been going on greater than 6 months.  She has joint line tenderness on exam.  ESI helped her right leg pain overall but did not help specifically her right knee pain.  Right knee x-rays taken today reveal lateral compartment osteoarthritis with medial compartment that looks fairly well-preserved.  Plan for right knee injection with cortisone injection today.  Patient tolerated procedure well.  Follow-up as needed.  Follow-Up Instructions: No follow-ups on file.    Orders:  Orders Placed This Encounter  Procedures  . XR KNEE 3 VIEW RIGHT   No orders of the defined types were placed in this encounter.     Procedures: Large Joint Inj: R knee on 05/25/2020 11:57 AM Indications: diagnostic evaluation, joint swelling and pain Details: 18 G 1.5 in needle, superolateral approach  Arthrogram: No  Medications: 5 mL lidocaine 1 %; 40 mg methylPREDNISolone acetate 40 MG/ML; 4 mL bupivacaine 0.25 % Outcome: tolerated well, no immediate complications Procedure, treatment alternatives, risks and benefits explained, specific risks discussed. Consent was given by the patient. Immediately prior to procedure a time out was called to verify the correct patient, procedure, equipment, support staff and site/side marked as required. Patient was prepped and draped in the usual sterile fashion.       Clinical Data: No additional findings.  Objective: Vital Signs: There were no vitals taken for this visit.  Physical Exam:  Constitutional: Patient appears well-developed HEENT:  Head: Normocephalic Eyes:EOM are normal Neck: Normal range of motion Cardiovascular: Normal rate Pulmonary/chest: Effort normal Neurologic: Patient is alert Skin: Skin is warm Psychiatric: Patient has normal mood and affect  Ortho Exam: Ortho exam demonstrates right knee with no effusion.  No pain with hip range of motion.  Joint line tenderness  over the medial joint line and tender to palpation over the pes anserine bursa.  5 degree flexion contracture is present.  No ligamentous laxity noted.  Extensor mechanism intact.  Specialty Comments:  No specialty comments available.  Imaging: No results found.   PMFS History: Patient Active Problem List   Diagnosis Date Noted  . Lumbar radiculopathy 10/10/2019  . Bilateral stenosis of lateral recess of lumbar spine 10/10/2019  . Hemorrhoids, external without complications 78/67/6720  . Depression   . Anxiety   . Rectal  bleeding   . History of radiation therapy   . Malignant neoplasm of anal canal ([place]) 11/15/2011  . Hemorrhoids, internal, with bleeding 09/07/2011  . Acquired anal stenosis 09/07/2011  . Hyperlipidemia 09/07/2011  . Family history of breast cancer in sister 09/07/2011   Past Medical History:  Diagnosis Date  . 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 Anxiety   . Depression   . Full dentures   . Hemorrhoids    external  . History of radiation therapy 11/29/11 to 01/05/12   anal canal 5040 cGy 28 sessions, regional lymph nodes 4200 cGy 28 sessions  . Hyperlipidemia   . Rectal bleeding   . Vitamin D deficiency   . Wears glasses     Family History  Problem Relation Age of Onset  . Cancer Sister        breast/ age 33  . Cervical cancer Sister        77  . Alcohol abuse Maternal Uncle     Past Surgical History:  Procedure Laterality Date  . ABDOMINAL HYSTERECTOMY  1978   age 25  . EXAMINATION UNDER ANESTHESIA  06/21/2012   Procedure: EXAM UNDER ANESTHESIA;  Surgeon: [name], MD;  Location: [place];  Service: General;  Laterality: N/A;  rectal exam under anesthesia, anal dilation, rectal biopsy  . [place]   Patient had to guess on the date.  [name] RECTAL BIOPSY  06/21/2012   Procedure: BIOPSY RECTAL;  Surgeon: [name], MD;  Location: [place];  Service: General;  Laterality: N/A;  . TUBAL LIGATION     b/l  age 9   Social History   Occupational History  . Not on file  Tobacco Use  . Smoking status: Former Smoker    Packs/day: 0.50    Years: 25.00    Pack years: 12.50    Types: Cigarettes    Quit date: 09/07/1987    Years since quitting: 32.7  . Smokeless tobacco: Never Used  Vaping Use  . Vaping Use: Never used  Substance and Sexual Activity  . Alcohol use: No  . Drug use: No  . Sexual activity: Not on file

## 2020-05-25 ENCOUNTER — Encounter: Payer: Self-pay | Admitting: Orthopedic Surgery

## 2020-05-25 DIAGNOSIS — M1711 Unilateral primary osteoarthritis, right knee: Secondary | ICD-10-CM

## 2020-05-25 MED ORDER — LIDOCAINE HCL 1 % IJ SOLN
5.0000 mL | INTRAMUSCULAR | Status: AC | PRN
  Administered 2020-05-25: 5 mL

## 2020-05-25 MED ORDER — METHYLPREDNISOLONE ACETATE 40 MG/ML IJ SUSP
40.0000 mg | INTRAMUSCULAR | Status: AC | PRN
  Administered 2020-05-25: 40 mg via INTRA_ARTICULAR

## 2020-05-25 MED ORDER — BUPIVACAINE HCL 0.25 % IJ SOLN
4.0000 mL | INTRAMUSCULAR | Status: AC | PRN
  Administered 2020-05-25: 4 mL via INTRA_ARTICULAR

## 2020-05-28 DIAGNOSIS — F331 Major depressive disorder, recurrent, moderate: Secondary | ICD-10-CM | POA: Diagnosis not present

## 2020-05-28 DIAGNOSIS — F411 Generalized anxiety disorder: Secondary | ICD-10-CM | POA: Diagnosis not present

## 2020-06-04 ENCOUNTER — Ambulatory Visit: Payer: Self-pay

## 2020-06-04 ENCOUNTER — Encounter: Payer: Self-pay | Admitting: Physical Medicine and Rehabilitation

## 2020-06-04 ENCOUNTER — Other Ambulatory Visit: Payer: Self-pay

## 2020-06-04 ENCOUNTER — Ambulatory Visit: Payer: Medicare PPO | Admitting: Physical Medicine and Rehabilitation

## 2020-06-04 VITALS — BP 145/85 | HR 75

## 2020-06-04 DIAGNOSIS — M5416 Radiculopathy, lumbar region: Secondary | ICD-10-CM

## 2020-06-04 DIAGNOSIS — M48061 Spinal stenosis, lumbar region without neurogenic claudication: Secondary | ICD-10-CM | POA: Diagnosis not present

## 2020-06-04 MED ORDER — METHYLPREDNISOLONE ACETATE 80 MG/ML IJ SUSP
80.0000 mg | Freq: Once | INTRAMUSCULAR | Status: AC
  Administered 2020-06-04: 80 mg

## 2020-06-04 NOTE — Progress Notes (Signed)
[name] - 74 y.o. female MRN [id number]  Date of birth: 1946-09-04  Office Visit Note: Visit Date: 06/04/2020 PCP: [name], MD Referred by: [name], MD  Subjective: Chief Complaint  Patient presents with  . Lower Back - Pain   HPI:  [name] is a 74 y.o. female who comes in today for planned repeat Bilateral S1-2 Lumbar epidural steroid injection with fluoroscopic guidance.  The patient has failed conservative care including home exercise, medications, time and activity modification.  This injection will be diagnostic and hopefully therapeutic.  Please see requesting physician notes for further details and justification. Patient received more than 50% pain relief from prior injection.   Referring: Dr. Anderson Malta  Patient once again ask about her spine and the reasons for her pain in the low back and legs.  We have gone over this at length on many occasions.  She does have pretty severe facet arthropathy at L5-S1 bilaterally with lateral recess narrowing at this level likely impacting the nerve roots at L5 and S1.  She probably has some back pain from facet arthritis in general.  She has had intermittent relief with epidural injection.  We discussed looking at diagnostic medial branch blocks for her lower back pain but it would probably not help the leg pain.  She likely would be a surgical candidate for this level with decompression.  She is already taking gabapentin as well as duloxetine and a fairly decent dose.  She could look through her primary care physician for more medication management but I am not sure what else they would add other than opioid medication which is usually not very good solution for this chronic back pain issue.  ROS Otherwise per HPI.  Assessment & Plan: Visit Diagnoses:  1. Lumbar radiculopathy   2. Bilateral stenosis of lateral recess of lumbar spine     Plan: No additional findings.   Meds & Orders:  Meds ordered this  encounter  Medications  . methylPREDNISolone acetate (DEPO-MEDROL) injection 80 mg    Orders Placed This Encounter  Procedures  . XR C-ARM NO REPORT  . Epidural Steroid injection    Follow-up: Return for visit to requesting physician as needed.   Procedures: No procedures performed  S1 Lumbosacral Transforaminal Epidural Steroid Injection - Sub-Pedicular Approach with Fluoroscopic Guidance   [name]      Date of Birth: 02/06/1946 MRN: [id number] PCP: [name], MD      Visit Date: 06/04/2020   Universal Protocol:    Date/Time: 08/05/216:12 AM  Consent Given By: the patient  Position:  PRONE  Additional Comments: Vital signs were monitored before and after the procedure. Patient was prepped and draped in the usual sterile fashion. The correct patient, procedure, and site was verified.   Injection Procedure Details:  Procedure Site One Meds Administered:  Meds ordered this encounter  Medications  . methylPREDNISolone acetate (DEPO-MEDROL) injection 80 mg    Laterality: Bilateral  Location/Site:  S1 Foramen   Needle size: 22 ga.  Needle type: Spinal  Needle Placement: Transforaminal  Findings:   -Comments: Excellent flow of contrast along the nerve and into the epidural space.  Epidurogram: Contrast epidurogram showed no nerve root cut off or restricted flow pattern.  Procedure Details: After squaring off the sacral end-plate to get a true AP view, the C-arm was positioned so that the best possible view of the S1 foramen was visualized. The soft tissues overlying this structure were infiltrated  with 2-3 ml. of 1% Lidocaine without Epinephrine.    The spinal needle was inserted toward the target using a "trajectory" view along the fluoroscope beam.  Under AP and lateral visualization, the needle was advanced so it did not puncture dura. Biplanar projections were used to confirm position. Aspiration was confirmed to be negative for CSF  and/or blood. A 1-2 ml. volume of Isovue-250 was injected and flow of contrast was noted at each level. Radiographs were obtained for documentation purposes.   After attaining the desired flow of contrast documented above, a 0.5 to 1.0 ml test dose of 0.25% Marcaine was injected into each respective transforaminal space.  The patient was observed for 90 seconds post injection.  After no sensory deficits were reported, and normal lower extremity motor function was noted,   the above injectate was administered so that equal amounts of the injectate were placed at each foramen (level) into the transforaminal epidural space.   Additional Comments:  The patient tolerated the procedure well Dressing: Band-Aid with 2 x 2 sterile gauze    Post-procedure details: Patient was observed during the procedure. Post-procedure instructions were reviewed.  Patient left the clinic in stable condition.     Clinical History: [facility]  [place], [address]  [place], [place] [address] Tel: [phone] Fax:  [phone] Test Date:  01/09/2020  [name] DOB: September 07, 1946 Physician: Narda Amber, DO Sex: Female Height: 5\' 5"  Ref Phys: Narda Amber, DO ID#: [id number] Temp: 32.0C Technician:   Patient Complaints: This is a 74 year old female referred for evaluation of bilateral feet and leg numbness and tingling.  NCV & EMG Findings: Extensive electrodiagnostic testing of the right lower extremity and additional studies of the left shows:  1. Bilateral sural and superficial peroneal sensory responses are within normal limits. 2. Bilateral peroneal motor responses are within normal limits.  Bilateral tibial motor responses show reduced amplitude (R2.9, L2.5 mV).   3. Bilateral H reflex studies are absent.   4. Chronic motor axonal loss changes are seen affecting the S1 myotome bilaterally, without accompanied active denervation.    Impression: 1. Chronic S1  radiculopathy affecting bilateral lower extremities, moderate. 2. There is no evidence of a large fiber sensorimotor polyneuropathy affecting the lower extremities.   --- Segmentation: Standard.    Alignment: Minimal grade 1 anterolisthesis of L4 on L5 and L5 on S1.    Vertebrae: No acute fracture, discitis-osteomyelitis, or aggressive  bone lesion. Chronic T10, T11, L1, L3 and L5 vertebral body  compression fractures. No focal marrow signal abnormality.    Conus medullaris and cauda equina: Conus extends to the T12-L1  level. Conus and cauda equina appear normal.    Paraspinal and other soft tissues: No acute paraspinal abnormality.    Disc levels:    Disc spaces: Degenerative disease with disc height loss at L5-S1.  Disc desiccation at L1-2, L2-3, L3-4 and L4-5.    T12-L1: Broad-based disc bulge mildly flattening the ventral thecal  sac. Mild bilateral facet arthropathy. No evidence of neural  foraminal stenosis. No central canal stenosis.    L1-L2: Mild broad-based disc bulge. Mild bilateral facet  arthropathy. No evidence of neural foraminal stenosis. No central  canal stenosis.    L2-L3: Mild broad-based disc bulge. Moderate bilateral facet  arthropathy. Mild bilateral foraminal stenosis. Mild spinal  stenosis.    L3-L4: Mild broad-based disc bulge flattening the ventral thecal  sac. Moderate bilateral facet arthropathy with ligamentum flavum  infolding. No evidence of  neural foraminal stenosis. No central  canal stenosis.    L4-L5: Broad-based disc bulge. Ankylosis of the posterior elements  at L4-5 with hypertrophic changes. Mild bilateral foraminal  stenosis. Mild spinal stenosis.    L5-S1: Broad-based disc bulge. Severe bilateral facet arthropathy  with bilateral facet effusions. Bilateral subarticular recess  stenosis. Severe left and moderate right foraminal stenosis.    IMPRESSION:  1. Diffuse lumbar spine spondylosis as detailed above.  2.  No acute osseous injury of the lumbar spine.  3. No aggressive osseous lesion to suggest metastatic disease.      Electronically Signed  By: Kathreen Devoid  On: 09/07/2019 08:47     Objective:  VS:  HT:    WT:   BMI:     BP:(!) 145/85  HR:75bpm  TEMP: ( )  RESP:  Physical Exam Constitutional:      General: She is not in acute distress.    Appearance: Normal appearance. She is not ill-appearing.  HENT:     Head: Normocephalic and atraumatic.     Right Ear: External ear normal.     Left Ear: External ear normal.  Eyes:     Extraocular Movements: Extraocular movements intact.  Cardiovascular:     Rate and Rhythm: Normal rate.     Pulses: Normal pulses.  Musculoskeletal:     Right lower leg: No edema.     Left lower leg: No edema.     Comments: Patient has good distal strength with no pain over the greater trochanters.  No clonus or focal weakness.  She does stand with forward flexed spine.  Has pain with facet loading.  Skin:    Findings: No erythema, lesion or rash.  Neurological:     General: No focal deficit present.     Mental Status: She is alert and oriented to person, place, and time.     Sensory: No sensory deficit.     Motor: No weakness or abnormal muscle tone.     Coordination: Coordination normal.  Psychiatric:        Mood and Affect: Mood normal.        Behavior: Behavior normal.      Imaging: XR C-ARM NO REPORT  Result Date: 06/04/2020 Please see Notes tab for imaging impression.

## 2020-06-04 NOTE — Progress Notes (Signed)
Pt states lower back pain. Pt states walking and standing makes the pain worse. Pt states laying down help sum with the pain. Pt has hx of inj 04/02/20 it helped for a while till two weeks ago.  Numeric Pain Rating Scale and Functional Assessment Average Pain 10   In the last MONTH (on 0-10 scale) has pain interfered with the following?  1. General activity like being  able to carry out your everyday physical activities such as walking, climbing stairs, carrying groceries, or moving a chair?  Rating(10)   +Driver, -BT, -Dye Allergies.

## 2020-06-05 NOTE — Procedures (Signed)
S1 Lumbosacral Transforaminal Epidural Steroid Injection - Sub-Pedicular Approach with Fluoroscopic Guidance   [name]      Date of Birth: 01-20-1946 MRN: [id number] PCP: [name], MD      Visit Date: 06/04/2020   Universal Protocol:    Date/Time: 08/05/216:12 AM  Consent Given By: the patient  Position:  PRONE  Additional Comments: Vital signs were monitored before and after the procedure. Patient was prepped and draped in the usual sterile fashion. The correct patient, procedure, and site was verified.   Injection Procedure Details:  Procedure Site One Meds Administered:  Meds ordered this encounter  Medications  . methylPREDNISolone acetate (DEPO-MEDROL) injection 80 mg    Laterality: Bilateral  Location/Site:  S1 Foramen   Needle size: 22 ga.  Needle type: Spinal  Needle Placement: Transforaminal  Findings:   -Comments: Excellent flow of contrast along the nerve and into the epidural space.  Epidurogram: Contrast epidurogram showed no nerve root cut off or restricted flow pattern.  Procedure Details: After squaring off the sacral end-plate to get a true AP view, the C-arm was positioned so that the best possible view of the S1 foramen was visualized. The soft tissues overlying this structure were infiltrated with 2-3 ml. of 1% Lidocaine without Epinephrine.    The spinal needle was inserted toward the target using a "trajectory" view along the fluoroscope beam.  Under AP and lateral visualization, the needle was advanced so it did not puncture dura. Biplanar projections were used to confirm position. Aspiration was confirmed to be negative for CSF and/or blood. A 1-2 ml. volume of Isovue-250 was injected and flow of contrast was noted at each level. Radiographs were obtained for documentation purposes.   After attaining the desired flow of contrast documented above, a 0.5 to 1.0 ml test dose of 0.25% Marcaine was injected into each  respective transforaminal space.  The patient was observed for 90 seconds post injection.  After no sensory deficits were reported, and normal lower extremity motor function was noted,   the above injectate was administered so that equal amounts of the injectate were placed at each foramen (level) into the transforaminal epidural space.   Additional Comments:  The patient tolerated the procedure well Dressing: Band-Aid with 2 x 2 sterile gauze    Post-procedure details: Patient was observed during the procedure. Post-procedure instructions were reviewed.  Patient left the clinic in stable condition.

## 2020-07-09 DIAGNOSIS — Z23 Encounter for immunization: Secondary | ICD-10-CM | POA: Diagnosis not present

## 2020-07-09 DIAGNOSIS — Z0000 Encounter for general adult medical examination without abnormal findings: Secondary | ICD-10-CM | POA: Diagnosis not present

## 2020-07-09 DIAGNOSIS — E785 Hyperlipidemia, unspecified: Secondary | ICD-10-CM | POA: Diagnosis not present

## 2020-07-09 DIAGNOSIS — M5136 Other intervertebral disc degeneration, lumbar region: Secondary | ICD-10-CM | POA: Diagnosis not present

## 2020-07-09 DIAGNOSIS — E559 Vitamin D deficiency, unspecified: Secondary | ICD-10-CM | POA: Diagnosis not present

## 2020-07-09 DIAGNOSIS — Z1159 Encounter for screening for other viral diseases: Secondary | ICD-10-CM | POA: Diagnosis not present

## 2020-07-09 DIAGNOSIS — E039 Hypothyroidism, unspecified: Secondary | ICD-10-CM | POA: Diagnosis not present

## 2020-07-09 DIAGNOSIS — E538 Deficiency of other specified B group vitamins: Secondary | ICD-10-CM | POA: Diagnosis not present

## 2020-07-14 ENCOUNTER — Telehealth: Payer: Self-pay | Admitting: Physical Medicine and Rehabilitation

## 2020-07-14 NOTE — Telephone Encounter (Signed)
Please advise 

## 2020-07-14 NOTE — Telephone Encounter (Signed)
Patient called requesting predisone. Patient states it helped her before and would like Dr. Ernestina Patches to call in prescription for her at [facility] on [address]. Patient phone number is [phone]. Pleased call patient when medication has been sent in

## 2020-07-15 ENCOUNTER — Other Ambulatory Visit: Payer: Self-pay | Admitting: Physical Medicine and Rehabilitation

## 2020-07-15 MED ORDER — PREDNISONE 50 MG PO TABS: ORAL_TABLET | ORAL | 0 refills | Status: DC

## 2020-07-25 ENCOUNTER — Other Ambulatory Visit: Payer: Self-pay | Admitting: Physical Medicine and Rehabilitation

## 2020-07-25 ENCOUNTER — Telehealth: Payer: Self-pay | Admitting: Physical Medicine and Rehabilitation

## 2020-07-25 MED ORDER — TRAMADOL HCL 50 MG PO TABS
50.0000 mg | ORAL_TABLET | Freq: Every day | ORAL | 0 refills | Status: DC | PRN
Start: 2020-07-25 — End: 2020-08-25

## 2020-07-25 NOTE — Telephone Encounter (Signed)
Patient called.   She is requesting something be called in for the pain in her right leg  Call back: [phone]

## 2020-07-25 NOTE — Telephone Encounter (Signed)
[place] for tramadol x1, needs OV or consult neuro/spine or f/up [employer]

## 2020-07-25 NOTE — Progress Notes (Signed)
Tramadol ok, she needs oV or spine surgeon or Marlou Sa

## 2020-07-25 NOTE — Telephone Encounter (Signed)
Please advise 

## 2020-07-25 NOTE — Telephone Encounter (Signed)
Patient has an OV scheduled for 10/6 with Dr. Marlou Sa.

## 2020-07-26 ENCOUNTER — Other Ambulatory Visit: Payer: Self-pay

## 2020-07-26 ENCOUNTER — Emergency Department (HOSPITAL_COMMUNITY)
Admission: EM | Admit: 2020-07-26 | Discharge: 2020-07-26 | Disposition: A | Discharge status: Home / Self Care | Payer: Medicare PPO | Attending: Emergency Medicine | Admitting: Emergency Medicine

## 2020-07-26 ENCOUNTER — Encounter (HOSPITAL_COMMUNITY): Payer: Self-pay

## 2020-07-26 DIAGNOSIS — M545 Low back pain: Secondary | ICD-10-CM | POA: Diagnosis present

## 2020-07-26 DIAGNOSIS — M5432 Sciatica, left side: Secondary | ICD-10-CM | POA: Insufficient documentation

## 2020-07-26 DIAGNOSIS — Z87891 Personal history of nicotine dependence: Secondary | ICD-10-CM | POA: Insufficient documentation

## 2020-07-26 DIAGNOSIS — Z7982 Long term (current) use of aspirin: Secondary | ICD-10-CM | POA: Diagnosis not present

## 2020-07-26 DIAGNOSIS — Z85048 Personal history of other malignant neoplasm of rectum, rectosigmoid junction, and anus: Secondary | ICD-10-CM | POA: Diagnosis not present

## 2020-07-26 DIAGNOSIS — G8929 Other chronic pain: Secondary | ICD-10-CM | POA: Diagnosis not present

## 2020-07-26 DIAGNOSIS — Z923 Personal history of irradiation: Secondary | ICD-10-CM | POA: Insufficient documentation

## 2020-07-26 DIAGNOSIS — M5442 Lumbago with sciatica, left side: Secondary | ICD-10-CM

## 2020-07-26 DIAGNOSIS — Z79899 Other long term (current) drug therapy: Secondary | ICD-10-CM | POA: Insufficient documentation

## 2020-07-26 MED ORDER — PREDNISONE 10 MG (21) PO TBPK: ORAL_TABLET | Freq: Every day | ORAL | 0 refills | Status: DC

## 2020-07-26 NOTE — ED Provider Notes (Signed)
[facility] EMERGENCY DEPARTMENT Provider Note   CSN: [id number] Arrival date & time: 07/26/20  0932     History No chief complaint on file.   [name] is a 74 y.o. female.  Presenting to ER with concern for low back pain.  She reports long history (years) of low back pain.  States that it has been more aggravated over the past few weeks.  She is followed by pain management, PMNR, orthopedist.  She reports that one of her doctors just sent a prescription for tramadol which seems to help but wears off eventually.  Pain lower back pain rating down left side.  Moderate, sharp, stabbing.  States that she has a tingling sensation around her left side as well but no numbness.  No weakness, no difficulty walking.  No bladder or bowel incontinence, no fevers, no falls or recent trauma.  HPI     Past Medical History:  Diagnosis Date  . 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 Anxiety   . Depression   . Full dentures   . Hemorrhoids    external  . History of radiation therapy 11/29/11 to 01/05/12   anal canal 5040 cGy 28 sessions, regional lymph nodes 4200 cGy 28 sessions  . Hyperlipidemia   . Rectal bleeding   . Vitamin D deficiency   . Wears glasses     Patient Active Problem List   Diagnosis Date Noted  . Lumbar radiculopathy 10/10/2019  . Bilateral stenosis of lateral recess of lumbar spine 10/10/2019  . Hemorrhoids, external without complications 97/35/3299  . Depression   . Anxiety   . Rectal bleeding   . History of radiation therapy   . Malignant neoplasm of anal canal ([place]) 11/15/2011  . Hemorrhoids, internal, with bleeding 09/07/2011  . Acquired anal stenosis 09/07/2011  . Hyperlipidemia 09/07/2011  . Family history of breast cancer in sister 09/07/2011    Past Surgical History:  Procedure Laterality Date  . ABDOMINAL HYSTERECTOMY  1978   age 80  . EXAMINATION UNDER ANESTHESIA  06/21/2012   Procedure: EXAM UNDER ANESTHESIA;  Surgeon:  [name], MD;  Location: [facility];  Service: General;  Laterality: N/A;  rectal exam under anesthesia, anal dilation, rectal biopsy  . [place]   Patient had to guess on the date.  [name] RECTAL BIOPSY  06/21/2012   Procedure: BIOPSY RECTAL;  Surgeon: [name], MD;  Location: [facility];  Service: General;  Laterality: N/A;  . TUBAL LIGATION     b/l  age 46     OB History    Gravida  1   Para  1   Term      Preterm      AB      Living        SAB      TAB      Ectopic      Multiple      Live Births              Family History  Problem Relation Age of Onset  . Cancer Sister        breast/ age 73  . Cervical cancer Sister        58  . Alcohol abuse Maternal Uncle     Social History   Tobacco Use  . Smoking status: Former Smoker    Packs/day: 0.50    Years: 25.00  Pack years: 12.50    Types: Cigarettes    Quit date: 09/07/1987    Years since quitting: 32.9  . Smokeless tobacco: Never Used  Vaping Use  . Vaping Use: Never used  Substance Use Topics  . Alcohol use: No  . Drug use: No    Home Medications Prior to Admission medications   Medication Sig Start Date End Date Taking? Authorizing Provider  ARIPiprazole (ABILIFY) 10 MG tablet Take 10 mg by mouth daily. 11/23/18   [provider]  aspirin EC 81 MG tablet Take 81 mg by mouth daily.    [provider]  bisacodyl (DULCOLAX) 10 MG suppository Place 1 suppository (10 mg total) rectally as needed for moderate constipation. 01/17/19   Khatri, Hina, PA-C  Calcium Carb-Cholecalciferol (CALTRATE 600+D3 SOFT PO) Take by mouth.    [provider]  calcium carbonate (TUMS - DOSED IN MG ELEMENTAL CALCIUM) 500 MG chewable tablet Chew 1 tablet by mouth 2 (two) times daily.    [provider]  cholecalciferol (VITAMIN D) 1000 UNITS tablet Take 1,000 Units by mouth daily.    [provider]  cyanocobalamin  500 MCG tablet Take 500 mcg by mouth daily.    [provider]  diclofenac (VOLTAREN) 75 MG EC tablet TAKE 1 TABLET(75 MG) BY MOUTH TWICE DAILY WITH FOOD 04/03/20   Magnus Sinning, MD  docusate sodium (COLACE) 100 MG capsule Take 1 capsule (100 mg total) by mouth every 12 (twelve) hours. 01/17/19   Khatri, Hina, PA-C  DULoxetine (CYMBALTA) 60 MG capsule Take 120 mg by mouth daily.     [provider]  folic acid (FOLVITE) 268 MCG tablet Take 400 mcg by mouth daily.    [provider]  gabapentin (NEURONTIN) 600 MG tablet Take 600 mg by mouth 2 (two) times daily. 07/14/16   [provider]  Garlic 3419 MG CAPS Take by mouth daily.    [provider]  levothyroxine (SYNTHROID, LEVOTHROID) 25 MCG tablet Take 25 mcg by mouth daily. 08/29/18   [provider]  Multiple Vitamins-Minerals (CENTRUM SILVER ULTRA WOMENS PO) Take by mouth.    [provider]  Omega-3 Fatty Acids (FISH OIL) 1000 MG CAPS Take by mouth.    [provider]  polyethylene glycol (MIRALAX / GLYCOLAX) packet Take 17 g by mouth daily. 01/17/19   Khatri, Hina, PA-C  pravastatin (PRAVACHOL) 40 MG tablet Take 40 mg by mouth daily with breakfast.     [provider]  predniSONE (DELTASONE) 50 MG tablet Take 1 tablet daily with food for 5 days until finished 07/15/20   Magnus Sinning, MD  predniSONE (STERAPRED UNI-PAK 21 TAB) 10 MG (21) TBPK tablet Take by mouth daily. Take 6 tabs by mouth daily  for 1 day, then 5 tabs for 1 day, then 4 tabs for 1 day, then 3 tabs for 1 day, 2 tabs for 1 day, then 1 tab by mouth daily for 1 day 07/26/20   Lucrezia Starch, MD  traMADol (ULTRAM) 50 MG tablet Take 1 tablet (50 mg total) by mouth daily as needed. 07/25/20   Magnus Sinning, MD  vitamin C (ASCORBIC ACID) 500 MG tablet Take 500 mg by mouth daily.    [provider]    Allergies    Patient has no known allergies.  Review of Systems   Review of Systems   Constitutional: Negative for chills and fever.  HENT: Negative for ear pain and sore throat.   Eyes: Negative  for pain and visual disturbance.  Respiratory: Negative for cough and shortness of breath.   Cardiovascular: Negative for chest pain and palpitations.  Gastrointestinal: Negative for abdominal pain and vomiting.  Genitourinary: Negative for dysuria and hematuria.  Musculoskeletal: Positive for arthralgias and back pain.  Skin: Negative for color change and rash.  Neurological: Negative for seizures and syncope.  All other systems reviewed and are negative.   Physical Exam Updated Vital Signs BP (!) 141/84   Pulse 79   Temp 98.5 F (36.9 C)   SpO2 100%   Physical Exam Vitals and nursing note reviewed.  Constitutional:      General: She is not in acute distress.    Appearance: She is well-developed.  HENT:     Head: Normocephalic and atraumatic.  Eyes:     Conjunctiva/sclera: Conjunctivae normal.  Cardiovascular:     Rate and Rhythm: Normal rate and regular rhythm.     Heart sounds: No murmur heard.   Pulmonary:     Effort: Pulmonary effort is normal. No respiratory distress.     Breath sounds: Normal breath sounds.  Abdominal:     Palpations: Abdomen is soft.     Tenderness: There is no abdominal tenderness.  Musculoskeletal:     Cervical back: Neck supple.     Comments: No tenderness to palpation throughout back, legs  Skin:    General: Skin is warm and dry.  Neurological:     General: No focal deficit present.     Mental Status: She is alert.     Comments: 5 out of 5 strength in bilateral lower extremities, sensation to light touch intact throughout lower extremities  Psychiatric:        Mood and Affect: Mood normal.        Behavior: Behavior normal.     ED Results / Procedures / Treatments   Labs (all labs ordered are listed, but only abnormal results are displayed) Labs Reviewed - No data to display  EKG None  Radiology No results  found.  Procedures Procedures (including critical care time)  Medications Ordered in ED Medications - No data to display  ED Course  I have reviewed the triage vital signs and the nursing notes.  Pertinent labs & imaging results that were available during my care of the patient were reviewed by me and considered in my medical decision making (see chart for details).    MDM Rules/Calculators/A&P                         74 year old lady presents to ER with concern for acute on chronic back pain.  Per history, no red flag symptoms.  On physical exam, she is well-appearing with no neuro deficits.  She has ample supply of tramadol at home which seems to be working as needed for breakthrough pain.  Recommend trial of steroid taper.  Recommended close follow-up with her regular doctors to manage this, Drs. Ernestina Patches and [employer].  After the discussed management above, the patient was determined to be safe for discharge.  The patient was in agreement with this plan and all questions regarding their care were answered.  ED return precautions were discussed and the patient will return to the ED with any significant worsening of condition.  Final Clinical Impression(s) / ED Diagnoses Final diagnoses:  Chronic left-sided low back pain with left-sided sciatica    Rx / DC Orders ED Discharge Orders  Ordered    predniSONE (STERAPRED UNI-PAK 21 TAB) 10 MG (21) TBPK tablet  Daily        07/26/20 1200           Lucrezia Starch, MD 07/26/20 1204

## 2020-07-26 NOTE — ED Notes (Signed)
Patient Alert and oriented to baseline. Stable and ambulatory to baseline. Patient verbalized understanding of the discharge instructions.  Patient belongings were taken by the patient.   

## 2020-07-26 NOTE — ED Triage Notes (Signed)
Patient complains of chronic lower back pain but yesterday developed pain radiating down left leg, denies trauma

## 2020-07-26 NOTE — Discharge Instructions (Signed)
Please schedule follow-up appointment with Dr. Marlou Sa and Dr. Ernestina Patches.  Discuss ongoing management of your back pain with them.  Please take steroids as prescribed.  Continue tramadol as needed.  Note ischemic dressings not be taken while driving or operating heavy machinery.  If you develop numbness, weakness, bladder or bowel incontinence, fever or other new concerning symptom, return to ER for reassessment.

## 2020-08-06 ENCOUNTER — Encounter: Payer: Self-pay | Admitting: Orthopedic Surgery

## 2020-08-06 ENCOUNTER — Ambulatory Visit: Payer: Medicare PPO | Admitting: Orthopedic Surgery

## 2020-08-06 DIAGNOSIS — M545 Low back pain, unspecified: Secondary | ICD-10-CM

## 2020-08-06 NOTE — Progress Notes (Signed)
Office Visit Note   [name]           Date of Birth: Jul 05, 1946           MRN: [id number] Visit Date: 08/06/2020 Requested by: [name], MD [address] [place],  [place] [address] PCP: [name], MD  Subjective: Chief Complaint  Patient presents with  . Lower Back - Pain    HPI: [name] is a 74 year old patient with back and left leg pain. Denies any history of injury. Reports numbness and tingling in the leg for the last 3 to 4 months. Went to the emergency department due to the pain. Most of the pain is in the lateral calf region. Starts in her low back and then travels down into the calf and then into the bottom of her foot. Prednisone helped. She did have an epidural steroid injection in the lumbar spine by Dr. Ernestina Patches in August which helped. She is on tramadol. Finished prednisone from the emergency department.              ROS: All systems reviewed are negative as they relate to the chief complaint within the history of present illness.  Patient denies  fevers or chills.   Assessment & Plan: Visit Diagnoses:  1. Low back pain, unspecified back pain laterality, unspecified chronicity, unspecified whether sciatica present     Plan: Impression is low back pain and left-sided radiculopathy in a patient with MRI scan from last year which showed some L5-S1 facet arthritis and possible L1 compression fracture which was small. Most of her symptoms clinically do correlate with L5-S1 facet arthritis and nerve root irritation on the left. Plan is to refer her to Dr. Ernestina Patches for lumbar spine ESI. She is really can have to decide if she wants to consider surgical intervention for this problem. Recommend giving the shots 1 more ago but if they do not give her sustained relief she will have to make the decision to live with this or consider surgical intervention  Follow-Up Instructions: No follow-ups on file.   Orders:  Orders Placed This Encounter    Procedures  . Ambulatory referral to Physical Medicine Rehab   No orders of the defined types were placed in this encounter.     Procedures: No procedures performed   Clinical Data: No additional findings.  Objective: Vital Signs: There were no vitals taken for this visit.  Physical Exam:   Constitutional: Patient appears well-developed HEENT:  Head: Normocephalic Eyes:EOM are normal Neck: Normal range of motion Cardiovascular: Normal rate Pulmonary/chest: Effort normal Neurologic: Patient is alert Skin: Skin is warm Psychiatric: Patient has normal mood and affect    Ortho Exam: Ortho exam demonstrates full active and passive range of motion of the knees ankles and hips. Gait nonantalgic. Hip flexion strength is symmetric. Rest of her exam is unchanged. No definite nerve root tension signs.  Specialty Comments:  No specialty comments available.  Imaging: No results found.   PMFS History: Patient Active Problem List   Diagnosis Date Noted  . Lumbar radiculopathy 10/10/2019  . Bilateral stenosis of lateral recess of lumbar spine 10/10/2019  . Hemorrhoids, external without complications 71/24/5809  . Depression   . Anxiety   . Rectal bleeding   . History of radiation therapy   . Malignant neoplasm of anal canal ([place]) 11/15/2011  . Hemorrhoids, internal, with bleeding 09/07/2011  . Acquired anal stenosis 09/07/2011  . Hyperlipidemia 09/07/2011  . Family history of breast  cancer in sister 09/07/2011   Past Medical History:  Diagnosis Date  . 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 Anxiety   . Depression   . Full dentures   . Hemorrhoids    external  . History of radiation therapy 11/29/11 to 01/05/12   anal canal 5040 cGy 28 sessions, regional lymph nodes 4200 cGy 28 sessions  . Hyperlipidemia   . Rectal bleeding   . Vitamin D deficiency   . Wears glasses     Family History  Problem Relation Age of Onset  . Cancer Sister        breast/  age 24  . Cervical cancer Sister        32  . Alcohol abuse Maternal Uncle     Past Surgical History:  Procedure Laterality Date  . ABDOMINAL HYSTERECTOMY  1978   age 62  . EXAMINATION UNDER ANESTHESIA  06/21/2012   Procedure: EXAM UNDER ANESTHESIA;  Surgeon: [name], MD;  Location: [facility];  Service: General;  Laterality: N/A;  rectal exam under anesthesia, anal dilation, rectal biopsy  . [place]   Patient had to guess on the date.  [name] RECTAL BIOPSY  06/21/2012   Procedure: BIOPSY RECTAL;  Surgeon: [name], MD;  Location: [facility];  Service: General;  Laterality: N/A;  . TUBAL LIGATION     b/l  age 79   Social History   Occupational History  . Not on file  Tobacco Use  . Smoking status: Former Smoker    Packs/day: 0.50    Years: 25.00    Pack years: 12.50    Types: Cigarettes    Quit date: 09/07/1987    Years since quitting: 32.9  . Smokeless tobacco: Never Used  Vaping Use  . Vaping Use: Never used  Substance and Sexual Activity  . Alcohol use: No  . Drug use: No  . Sexual activity: Not on file

## 2020-08-08 ENCOUNTER — Telehealth: Payer: Self-pay | Admitting: Physical Medicine and Rehabilitation

## 2020-08-08 NOTE — Telephone Encounter (Signed)
Pt called stating she would like to set an  Appt; pt states she has pain radiating from her back down her leg and she will be available for calls all today  [phone]

## 2020-08-08 NOTE — Telephone Encounter (Signed)
In referrals. Is auth needed?

## 2020-08-10 ENCOUNTER — Encounter (HOSPITAL_COMMUNITY): Payer: Self-pay | Admitting: Emergency Medicine

## 2020-08-10 ENCOUNTER — Emergency Department (HOSPITAL_COMMUNITY)
Admission: EM | Admit: 2020-08-10 | Discharge: 2020-08-10 | Disposition: A | Discharge status: Home / Self Care | Payer: Medicare PPO | Attending: Emergency Medicine | Admitting: Emergency Medicine

## 2020-08-10 ENCOUNTER — Other Ambulatory Visit: Payer: Self-pay

## 2020-08-10 DIAGNOSIS — M5416 Radiculopathy, lumbar region: Secondary | ICD-10-CM | POA: Insufficient documentation

## 2020-08-10 DIAGNOSIS — Z87891 Personal history of nicotine dependence: Secondary | ICD-10-CM | POA: Insufficient documentation

## 2020-08-10 DIAGNOSIS — Z7982 Long term (current) use of aspirin: Secondary | ICD-10-CM | POA: Insufficient documentation

## 2020-08-10 DIAGNOSIS — G8929 Other chronic pain: Secondary | ICD-10-CM | POA: Diagnosis not present

## 2020-08-10 DIAGNOSIS — M5432 Sciatica, left side: Secondary | ICD-10-CM

## 2020-08-10 DIAGNOSIS — Z79899 Other long term (current) drug therapy: Secondary | ICD-10-CM | POA: Diagnosis not present

## 2020-08-10 DIAGNOSIS — Z85048 Personal history of other malignant neoplasm of rectum, rectosigmoid junction, and anus: Secondary | ICD-10-CM | POA: Diagnosis not present

## 2020-08-10 DIAGNOSIS — M5442 Lumbago with sciatica, left side: Secondary | ICD-10-CM | POA: Insufficient documentation

## 2020-08-10 DIAGNOSIS — M5459 Other low back pain: Secondary | ICD-10-CM | POA: Diagnosis present

## 2020-08-10 MED ORDER — DEXAMETHASONE SODIUM PHOSPHATE 10 MG/ML IJ SOLN
10.0000 mg | Freq: Once | INTRAMUSCULAR | Status: AC
  Administered 2020-08-10: 10 mg via INTRAMUSCULAR
  Filled 2020-08-10: qty 1

## 2020-08-10 MED ORDER — KETOROLAC TROMETHAMINE 60 MG/2ML IM SOLN
60.0000 mg | Freq: Once | INTRAMUSCULAR | Status: AC
  Administered 2020-08-10: 60 mg via INTRAMUSCULAR
  Filled 2020-08-10: qty 2

## 2020-08-10 MED ORDER — METHYLPREDNISOLONE 4 MG PO TBPK: ORAL_TABLET | ORAL | 0 refills | Status: DC

## 2020-08-10 NOTE — ED Triage Notes (Signed)
Pt reports chronic lower back pain with radiation down L leg.  States she was diagnosed with sciatica and doesn't have appt with orthopedic for another 2 1/2 weeks.  States she received a cortisone shot 2 months ago for same and was told she needed to make a choice between pain management, a chiropractor, or surgery.

## 2020-08-10 NOTE — Discharge Instructions (Signed)
You may slowly increase your gabapentin from 600mg  twice daily to three times daily, would begin with half a tablet (300mg ) at lunch time and see how you tolerate prior to increasing more.

## 2020-08-10 NOTE — ED Provider Notes (Signed)
[facility]ER[facility] DEPARTMENT Provider Note   CSN: [id number] Arrival date & time: 08/10/20  4098     History Chief Complaint  Patient presents with  . Sciatica    [name] is a 74 y.o. female.  HPI      74yo female with history of anal squamous cell carcinoma hyperlipidemia,hx low back pain with left sided radiculopathy with previous MRI showing L5-21 facet arthritis and nerve root irritation who presents with continuing and worsening left sided radiculopathy.  Reports that spinal steroid shots have helped her in the past and is hopeful she can receive steroid shot in thte ED to help with pain relief.  Has been taking gabapentin , tramadol .  Reports numbness. Does not describe weakness but has significant pain limiting ability to ambulate.   No recent trauma. Believes she had imaging 6 mos ago when symptoms began.  Denies loss of control of bowel or bladder, no fevers, IVDU.  Pain radiating from left buttock to back of left leg  Past Medical History:  Diagnosis Date  . 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 Anxiety   . Depression   . Full dentures   . Hemorrhoids    external  . History of radiation therapy 11/29/11 to 01/05/12   anal canal 5040 cGy 28 sessions, regional lymph nodes 4200 cGy 28 sessions  . Hyperlipidemia   . Rectal bleeding   . Vitamin D deficiency   . Wears glasses     Patient Active Problem List   Diagnosis Date Noted  . Lumbar radiculopathy 10/10/2019  . Bilateral stenosis of lateral recess of lumbar spine 10/10/2019  . Hemorrhoids, external without complications 11/91/4782  . Depression   . Anxiety   . Rectal bleeding   . History of radiation therapy   . Malignant neoplasm of anal canal ([place]) 11/15/2011  . Hemorrhoids, internal, with bleeding 09/07/2011  . Acquired anal stenosis 09/07/2011  . Hyperlipidemia 09/07/2011  . Family history of breast cancer in sister 09/07/2011    Past Surgical History:   Procedure Laterality Date  . ABDOMINAL HYSTERECTOMY  1978   age 30  . EXAMINATION UNDER ANESTHESIA  06/21/2012   Procedure: EXAM UNDER ANESTHESIA;  Surgeon: [name], MD;  Location: [facility];  Service: General;  Laterality: N/A;  rectal exam under anesthesia, anal dilation, rectal biopsy  . [place]   Patient had to guess on the date.  [name] RECTAL BIOPSY  06/21/2012   Procedure: BIOPSY RECTAL;  Surgeon: [name], MD;  Location: [facility];  Service: General;  Laterality: N/A;  . TUBAL LIGATION     b/l  age 35     OB History    Gravida  1   Para  1   Term      Preterm      AB      Living        SAB      TAB      Ectopic      Multiple      Live Births              Family History  Problem Relation Age of Onset  . Cancer Sister        breast/ age 71  . Cervical cancer Sister        52  . Alcohol abuse Maternal Uncle     Social History  Tobacco Use  . Smoking status: Former Smoker    Packs/day: 0.50    Years: 25.00    Pack years: 12.50    Types: Cigarettes    Quit date: 09/07/1987    Years since quitting: 32.9  . Smokeless tobacco: Never Used  Vaping Use  . Vaping Use: Never used  Substance Use Topics  . Alcohol use: No  . Drug use: No    Home Medications Prior to Admission medications   Medication Sig Start Date End Date Taking? Authorizing Provider  ARIPiprazole (ABILIFY) 10 MG tablet Take 10 mg by mouth daily. 11/23/18   [provider]  aspirin EC 81 MG tablet Take 81 mg by mouth daily.    [provider]  bisacodyl (DULCOLAX) 10 MG suppository Place 1 suppository (10 mg total) rectally as needed for moderate constipation. 01/17/19   Khatri, Hina, PA-C  Calcium Carb-Cholecalciferol (CALTRATE 600+D3 SOFT PO) Take by mouth.    [provider]  calcium carbonate (TUMS - DOSED IN MG ELEMENTAL CALCIUM) 500 MG chewable tablet Chew 1 tablet by mouth 2 (two) times  daily.    [provider]  cholecalciferol (VITAMIN D) 1000 UNITS tablet Take 1,000 Units by mouth daily.    [provider]  cyanocobalamin 500 MCG tablet Take 500 mcg by mouth daily.    [provider]  diclofenac (VOLTAREN) 75 MG EC tablet TAKE 1 TABLET(75 MG) BY MOUTH TWICE DAILY WITH FOOD 04/03/20   Magnus Sinning, MD  docusate sodium (COLACE) 100 MG capsule Take 1 capsule (100 mg total) by mouth every 12 (twelve) hours. 01/17/19   Khatri, Hina, PA-C  DULoxetine (CYMBALTA) 60 MG capsule Take 120 mg by mouth daily.     [provider]  folic acid (FOLVITE) 169 MCG tablet Take 400 mcg by mouth daily.    [provider]  gabapentin (NEURONTIN) 600 MG tablet Take 600 mg by mouth 2 (two) times daily. 07/14/16   [provider]  Garlic 6789 MG CAPS Take by mouth daily.    [provider]  levothyroxine (SYNTHROID, LEVOTHROID) 25 MCG tablet Take 25 mcg by mouth daily. 08/29/18   [provider]  methylPREDNISolone (MEDROL DOSEPAK) 4 MG TBPK tablet See instructions 08/10/20   Gareth Morgan, MD  Multiple Vitamins-Minerals (CENTRUM SILVER ULTRA WOMENS PO) Take by mouth.    [provider]  Omega-3 Fatty Acids (FISH OIL) 1000 MG CAPS Take by mouth.    [provider]  polyethylene glycol (MIRALAX / GLYCOLAX) packet Take 17 g by mouth daily. 01/17/19   Khatri, Hina, PA-C  pravastatin (PRAVACHOL) 40 MG tablet Take 40 mg by mouth daily with breakfast.     [provider]  predniSONE (DELTASONE) 50 MG tablet Take 1 tablet daily with food for 5 days until finished 07/15/20   Magnus Sinning, MD  predniSONE (STERAPRED UNI-PAK 21 TAB) 10 MG (21) TBPK tablet Take by mouth daily. Take 6 tabs by mouth daily  for 1 day, then 5 tabs for 1 day, then 4 tabs for 1 day, then 3 tabs for 1 day, 2 tabs for 1 day, then 1 tab by mouth daily for 1 day 07/26/20   Lucrezia Starch, MD  traMADol (ULTRAM) 50 MG tablet Take 1 tablet  (50 mg total) by mouth daily as needed. 07/25/20   Magnus Sinning, MD  vitamin C (ASCORBIC ACID) 500 MG tablet Take 500 mg by mouth daily.    [provider]    Allergies  Patient has no known allergies.  Review of Systems   Review of Systems  Constitutional: Negative for fever.  HENT: Negative for sore throat.   Eyes: Negative for visual disturbance.  Respiratory: Negative for cough and shortness of breath.   Cardiovascular: Negative for chest pain.  Gastrointestinal: Negative for abdominal pain.  Genitourinary: Negative for difficulty urinating.  Musculoskeletal: Positive for arthralgias. Negative for back pain (not necessarily back but radiating to hip and leg) and neck pain.  Skin: Negative for rash.  Neurological: Positive for numbness. Negative for syncope, weakness and headaches.    Physical Exam Updated Vital Signs BP (!) 154/83   Pulse 80   Temp 98.4 F (36.9 C) (Oral)   Resp 16   Wt 74.8 kg   SpO2 97%   BMI 27.46 kg/m   Physical Exam Vitals and nursing note reviewed.  Constitutional:      General: She is not in acute distress.    Appearance: Normal appearance. She is not ill-appearing, toxic-appearing or diaphoretic.  HENT:     Head: Normocephalic.  Eyes:     Conjunctiva/sclera: Conjunctivae normal.  Cardiovascular:     Rate and Rhythm: Normal rate and regular rhythm.     Pulses: Normal pulses.  Pulmonary:     Effort: Pulmonary effort is normal. No respiratory distress.  Musculoskeletal:        General: No deformity or signs of injury.     Cervical back: No rigidity.  Skin:    General: Skin is warm and dry.     Coloration: Skin is not jaundiced or pale.  Neurological:     General: No focal deficit present.     Mental Status: She is alert and oriented to person, place, and time.     Comments: 5/5 strength LE, reports normal sensation     ED Results / Procedures / Treatments   Labs (all labs ordered are listed, but only abnormal  results are displayed) Labs Reviewed - No data to display  EKG None  Radiology No results found.  Procedures Procedures (including critical care time)  Medications Ordered in ED Medications  dexamethasone (DECADRON) injection 10 mg (10 mg Intramuscular Given 08/10/20 1356)  ketorolac (TORADOL) injection 60 mg (60 mg Intramuscular Given 08/10/20 1356)    ED Course  I have reviewed the triage vital signs and the nursing notes.  Pertinent labs & imaging results that were available during my care of the patient were reviewed by me and considered in my medical decision making (see chart for details).    MDM Rules/Calculators/A&P                          74yo female with history of anal squamous cell carcinoma hyperlipidemia,hx low back pain with left sided radiculopathy with previous MRI showing L5-21 facet arthritis and nerve root irritation who presents with continuing and worsening left sided radiculopathy.  Patient has a normal neurologic exam and denies any urinary retention or overflow incontinence, stool incontinence, saddle anesthesia, fever, IV drug use, trauma, chronic steroid use or immunocompromise and have low suspicion suspicion for cauda equina, fracture, epidural abscess, or vertebral osteomyelitis. Is being followed by Orthopedics and [place] [facility] physicians with continuing symptoms and pain. Given decadron in ED and medrol dose pack for symptom assistance. Patient discharged in stable condition with understanding of reasons to return.    Final Clinical Impression(s) / ED Diagnoses Final diagnoses:  Sciatica of left side    Rx /  DC Orders ED Discharge Orders         Ordered    methylPREDNISolone (MEDROL DOSEPAK) 4 MG TBPK tablet        08/10/20 1329           Gareth Morgan, MD 08/11/20 2154

## 2020-08-21 ENCOUNTER — Other Ambulatory Visit: Payer: Self-pay | Admitting: Physical Medicine and Rehabilitation

## 2020-08-22 ENCOUNTER — Telehealth: Payer: Self-pay | Admitting: Physical Medicine and Rehabilitation

## 2020-08-22 NOTE — Telephone Encounter (Signed)
Pt called stating she ran out of her tramadol on 08/21/20 and needs a refill sent in today is possible; pt would like to be notified when this has been sent in  [phone]

## 2020-08-25 NOTE — Telephone Encounter (Signed)
This was refilled, she has injection appointment coming. Tell if not managed by injection then refills of tramadol through PCP or pain mgt.

## 2020-08-25 NOTE — Telephone Encounter (Signed)
Please advise 

## 2020-08-27 ENCOUNTER — Ambulatory Visit: Payer: Medicare PPO | Admitting: Physical Medicine and Rehabilitation

## 2020-08-27 ENCOUNTER — Encounter: Payer: Self-pay | Admitting: Physical Medicine and Rehabilitation

## 2020-08-27 ENCOUNTER — Ambulatory Visit: Payer: Self-pay

## 2020-08-27 ENCOUNTER — Other Ambulatory Visit: Payer: Self-pay

## 2020-08-27 VITALS — BP 144/81 | HR 79

## 2020-08-27 DIAGNOSIS — M5416 Radiculopathy, lumbar region: Secondary | ICD-10-CM

## 2020-08-27 MED ORDER — METHYLPREDNISOLONE ACETATE 80 MG/ML IJ SUSP
80.0000 mg | Freq: Once | INTRAMUSCULAR | Status: AC
  Administered 2020-08-27: 80 mg

## 2020-08-27 NOTE — Progress Notes (Signed)
Pain in left buttock down left leg to just above ankle. Numbness and tingling. Numeric Pain Rating Scale and Functional Assessment Average Pain 10   In the last MONTH (on 0-10 scale) has pain interfered with the following?  1. General activity like being  able to carry out your everyday physical activities such as walking, climbing stairs, carrying groceries, or moving a chair?  Rating(9)   +Driver, -BT, -Dye Allergies.

## 2020-08-27 NOTE — Procedures (Signed)
S1 Lumbosacral Transforaminal Epidural Steroid Injection - Sub-Pedicular Approach with Fluoroscopic Guidance   [name]      Date of Birth: November 07, 1945 MRN: [id number] PCP: [name], MD      Visit Date: 04/02/2020   Universal Protocol:    Date/Time: 08/28/2111:58 PM  Consent Given By: the patient  Position:  PRONE  Additional Comments: Vital signs were monitored before and after the procedure. Patient was prepped and draped in the usual sterile fashion. The correct patient, procedure, and site was verified.   Injection Procedure Details:  Procedure Site One Meds Administered:  Meds ordered this encounter  Medications  . DISCONTD: methylPREDNISolone acetate (DEPO-MEDROL) injection 80 mg    Laterality: Right  Location/Site:  S1 Foramen   Needle size: 22 ga.  Needle type: Spinal  Needle Placement: Transforaminal  Findings:   -Comments: Excellent flow of contrast along the nerve and into the epidural space.  Epidurogram: Contrast epidurogram showed no nerve root cut off or restricted flow pattern.  Procedure Details: After squaring off the sacral end-plate to get a true AP view, the C-arm was positioned so that the best possible view of the S1 foramen was visualized. The soft tissues overlying this structure were infiltrated with 2-3 ml. of 1% Lidocaine without Epinephrine.    The spinal needle was inserted toward the target using a "trajectory" view along the fluoroscope beam.  Under AP and lateral visualization, the needle was advanced so it did not puncture dura. Biplanar projections were used to confirm position. Aspiration was confirmed to be negative for CSF and/or blood. A 1-2 ml. volume of Isovue-250 was injected and flow of contrast was noted at each level. Radiographs were obtained for documentation purposes.   After attaining the desired flow of contrast documented above, a 0.5 to 1.0 ml test dose of 0.25% Marcaine was injected into each  respective transforaminal space.  The patient was observed for 90 seconds post injection.  After no sensory deficits were reported, and normal lower extremity motor function was noted,   the above injectate was administered so that equal amounts of the injectate were placed at each foramen (level) into the transforaminal epidural space.   Additional Comments:  The patient tolerated the procedure well Dressing: Band-Aid with 2 x 2 sterile gauze    Post-procedure details: Patient was observed during the procedure. Post-procedure instructions were reviewed.  Patient left the clinic in stable condition.

## 2020-09-03 ENCOUNTER — Telehealth: Payer: Self-pay | Admitting: Physical Medicine and Rehabilitation

## 2020-09-03 NOTE — Telephone Encounter (Signed)
Patient called to set appt for right hip injection. Please call patient at [phone].

## 2020-09-03 NOTE — Telephone Encounter (Signed)
Called patient to advise. She will call her pharmacy to see if they have the prescription for Tramadol that was sent on 10/25.

## 2020-09-03 NOTE — Telephone Encounter (Signed)
Yes if injection not helpful her best best bet is decompression of some sort. PCP for meds or if needed can refill tramadol until seeing Nitka. Tramadol not helpful either.

## 2020-09-03 NOTE — Telephone Encounter (Signed)
Called patient to get more information. She states that she "wants any kind of injection anywhere" to help with her pain because her injection on 10/26 has not helped. Patient is scheduled with Dr. Louanne Skye on 12/15.

## 2020-09-08 ENCOUNTER — Telehealth: Payer: Self-pay | Admitting: Physical Medicine and Rehabilitation

## 2020-09-08 NOTE — Telephone Encounter (Signed)
Called pt and she sch appt with Pain management.

## 2020-09-08 NOTE — Telephone Encounter (Signed)
Pt called asking if she can sch an apt with pain management or can Dr. Ernestina Patches.   Pt said it is okay to leave message on her phone

## 2020-09-10 DIAGNOSIS — M5416 Radiculopathy, lumbar region: Secondary | ICD-10-CM | POA: Diagnosis not present

## 2020-09-10 DIAGNOSIS — M62830 Muscle spasm of back: Secondary | ICD-10-CM | POA: Diagnosis not present

## 2020-09-10 DIAGNOSIS — M546 Pain in thoracic spine: Secondary | ICD-10-CM | POA: Diagnosis not present

## 2020-09-10 DIAGNOSIS — Z79899 Other long term (current) drug therapy: Secondary | ICD-10-CM | POA: Diagnosis not present

## 2020-09-10 DIAGNOSIS — M542 Cervicalgia: Secondary | ICD-10-CM | POA: Diagnosis not present

## 2020-09-17 DIAGNOSIS — F331 Major depressive disorder, recurrent, moderate: Secondary | ICD-10-CM | POA: Diagnosis not present

## 2020-09-17 DIAGNOSIS — F411 Generalized anxiety disorder: Secondary | ICD-10-CM | POA: Diagnosis not present

## 2020-09-24 DIAGNOSIS — M546 Pain in thoracic spine: Secondary | ICD-10-CM | POA: Diagnosis not present

## 2020-09-24 DIAGNOSIS — M5416 Radiculopathy, lumbar region: Secondary | ICD-10-CM | POA: Diagnosis not present

## 2020-09-24 DIAGNOSIS — Z79899 Other long term (current) drug therapy: Secondary | ICD-10-CM | POA: Diagnosis not present

## 2020-09-24 DIAGNOSIS — M129 Arthropathy, unspecified: Secondary | ICD-10-CM | POA: Diagnosis not present

## 2020-09-24 DIAGNOSIS — M62830 Muscle spasm of back: Secondary | ICD-10-CM | POA: Diagnosis not present

## 2020-09-24 DIAGNOSIS — E559 Vitamin D deficiency, unspecified: Secondary | ICD-10-CM | POA: Diagnosis not present

## 2020-09-24 DIAGNOSIS — M542 Cervicalgia: Secondary | ICD-10-CM | POA: Diagnosis not present

## 2020-09-29 NOTE — Procedures (Signed)
Lumbosacral Transforaminal Epidural Steroid Injection - Sub-Pedicular Approach with Fluoroscopic Guidance  [name]      Date of Birth: 1946-08-23 MRN: [id number] PCP: [name], MD      Visit Date: 08/27/2020   Universal Protocol:    Date/Time: 08/27/2020  Consent Given By: the patient  Position: PRONE  Additional Comments: Vital signs were monitored before and after the procedure. Patient was prepped and draped in the usual sterile fashion. The correct patient, procedure, and site was verified.   Injection Procedure Details:   Procedure diagnoses:  1. Lumbar radiculopathy      Meds Administered:  Meds ordered this encounter  Medications  . methylPREDNISolone acetate (DEPO-MEDROL) injection 80 mg    Laterality: Left  Location/Site:  L5-S1  Needle:5.0 in., 22 ga.  Short bevel or Quincke spinal needle  Needle Placement: Transforaminal  Findings:    -Comments: Excellent flow of contrast along the nerve, nerve root and into the epidural space.  Procedure Details: After squaring off the end-plates to get a true AP view, the C-arm was positioned so that an oblique view of the foramen as noted above was visualized. The target area is just inferior to the "nose of the scotty dog" or sub pedicular. The soft tissues overlying this structure were infiltrated with 2-3 ml. of 1% Lidocaine without Epinephrine.  The spinal needle was inserted toward the target using a "trajectory" view along the fluoroscope beam.  Under AP and lateral visualization, the needle was advanced so it did not puncture dura and was located close the 6 O'Clock position of the pedical in AP tracterory. Biplanar projections were used to confirm position. Aspiration was confirmed to be negative for CSF and/or blood. A 1-2 ml. volume of Isovue-250 was injected and flow of contrast was noted at each level. Radiographs were obtained for documentation purposes.   After attaining the desired  flow of contrast documented above, a 0.5 to 1.0 ml test dose of 0.25% Marcaine was injected into each respective transforaminal space.  The patient was observed for 90 seconds post injection.  After no sensory deficits were reported, and normal lower extremity motor function was noted,   the above injectate was administered so that equal amounts of the injectate were placed at each foramen (level) into the transforaminal epidural space.   Additional Comments:  The patient tolerated the procedure well Dressing: 2 x 2 sterile gauze and Band-Aid    Post-procedure details: Patient was observed during the procedure. Post-procedure instructions were reviewed.  Patient left the clinic in stable condition.

## 2020-09-29 NOTE — Progress Notes (Signed)
[name] - 74 y.o. female MRN [id number]  Date of birth: [birth year]  Office Visit Note: Visit Date: 08/27/2020 PCP: [name], MD Referred by: [name], MD  Subjective: Chief Complaint  Patient presents with  . Lower Back - Pain  . Left Leg - Pain   HPI:  [name] is a 74 y.o. female who comes in today at the request of Dr. Anderson Malta for planned Left L5-S1 Lumbar epidural steroid injection with fluoroscopic guidance.  The patient has failed conservative care including home exercise, medications, time and activity modification.  This injection will be diagnostic and hopefully therapeutic.  Please see requesting physician notes for further details and justification.   By way of quick review we have completed a couple of S1 transforaminal epidural steroid injections with really only limited relief and short-term relief.  Despite visits with myself and Dr. Marlou Sa she has had 2 emergency room visits as well.  She continues to have left radicular hip and leg pain which is quite severe.  She rates this pain as a 10 out of 10 despite pain medication.  She has had several rounds of prednisone tapers to the emergency department.  She has had tramadol and other pain medication without much relief.  I recently refilled her tramadol before coming to this appointment.  She is on gabapentin at quite a good dose.  The reason we chose the S1 injection initially was due to electrodiagnostic study by Dr. Narda Amber showing chronic S1 radiculopathy.  MRI imaging shows facet arthropathy at L5-S1 with left severe foraminal narrowing but also subarticular lateral recess narrowing.  Today we will try the L5 transforaminal approach.  I also want her to see Dr. Basil Dess for possible surgical procedure.   ROS Otherwise per HPI.  Assessment & Plan: Visit Diagnoses:  1. Lumbar radiculopathy     Plan: No additional findings.   Meds & Orders:  Meds ordered this encounter  Medications   . methylPREDNISolone acetate (DEPO-MEDROL) injection 80 mg    Orders Placed This Encounter  Procedures  . XR C-ARM NO REPORT  . Ambulatory referral to Orthopedic Surgery  . Epidural Steroid injection    Follow-up: Return if symptoms worsen or fail to improve.   Procedures: No procedures performed  Lumbosacral Transforaminal Epidural Steroid Injection - Sub-Pedicular Approach with Fluoroscopic Guidance  [name]      Date of Birth: 07/08/1946 MRN: [id number] PCP: [name], MD      Visit Date: 08/27/2020   Universal Protocol:    Date/Time: 08/27/2020  Consent Given By: the patient  Position: PRONE  Additional Comments: Vital signs were monitored before and after the procedure. Patient was prepped and draped in the usual sterile fashion. The correct patient, procedure, and site was verified.   Injection Procedure Details:   Procedure diagnoses:  1. Lumbar radiculopathy      Meds Administered:  Meds ordered this encounter  Medications  . methylPREDNISolone acetate (DEPO-MEDROL) injection 80 mg    Laterality: Left  Location/Site:  L5-S1  Needle:5.0 in., 22 ga.  Short bevel or Quincke spinal needle  Needle Placement: Transforaminal  Findings:    -Comments: Excellent flow of contrast along the nerve, nerve root and into the epidural space.  Procedure Details: After squaring off the end-plates to get a true AP view, the C-arm was positioned so that an oblique view of the foramen as noted above was visualized. The target area is just inferior to the "  nose of the scotty dog" or sub pedicular. The soft tissues overlying this structure were infiltrated with 2-3 ml. of 1% Lidocaine without Epinephrine.  The spinal needle was inserted toward the target using a "trajectory" view along the fluoroscope beam.  Under AP and lateral visualization, the needle was advanced so it did not puncture dura and was located close the 6 O'Clock position of the  pedical in AP tracterory. Biplanar projections were used to confirm position. Aspiration was confirmed to be negative for CSF and/or blood. A 1-2 ml. volume of Isovue-250 was injected and flow of contrast was noted at each level. Radiographs were obtained for documentation purposes.   After attaining the desired flow of contrast documented above, a 0.5 to 1.0 ml test dose of 0.25% Marcaine was injected into each respective transforaminal space.  The patient was observed for 90 seconds post injection.  After no sensory deficits were reported, and normal lower extremity motor function was noted,   the above injectate was administered so that equal amounts of the injectate were placed at each foramen (level) into the transforaminal epidural space.   Additional Comments:  The patient tolerated the procedure well Dressing: 2 x 2 sterile gauze and Band-Aid    Post-procedure details: Patient was observed during the procedure. Post-procedure instructions were reviewed.  Patient left the clinic in stable condition.     Clinical History: [facility]  [place], [address]  [place], [place] [address] Tel: [phone] Fax:  [phone] Test Date:  01/09/2020  [name] DOB: 1946/08/23 Physician: Narda Amber, DO Sex: Female Height: 5\' 5"  Ref Phys: Narda Amber, DO ID#: [id number] Temp: 32.0C Technician:   Patient Complaints: This is a 74 year old female referred for evaluation of bilateral feet and leg numbness and tingling.  NCV & EMG Findings: Extensive electrodiagnostic testing of the right lower extremity and additional studies of the left shows:  1. Bilateral sural and superficial peroneal sensory responses are within normal limits. 2. Bilateral peroneal motor responses are within normal limits.  Bilateral tibial motor responses show reduced amplitude (R2.9, L2.5 mV).   3. Bilateral H reflex studies are absent.   4. Chronic motor axonal loss changes  are seen affecting the S1 myotome bilaterally, without accompanied active denervation.    Impression: 1. Chronic S1 radiculopathy affecting bilateral lower extremities, moderate. 2. There is no evidence of a large fiber sensorimotor polyneuropathy affecting the lower extremities.   --- Segmentation: Standard.    Alignment: Minimal grade 1 anterolisthesis of L4 on L5 and L5 on S1.    Vertebrae: No acute fracture, discitis-osteomyelitis, or aggressive  bone lesion. Chronic T10, T11, L1, L3 and L5 vertebral body  compression fractures. No focal marrow signal abnormality.    Conus medullaris and cauda equina: Conus extends to the T12-L1  level. Conus and cauda equina appear normal.    Paraspinal and other soft tissues: No acute paraspinal abnormality.    Disc levels:    Disc spaces: Degenerative disease with disc height loss at L5-S1.  Disc desiccation at L1-2, L2-3, L3-4 and L4-5.    T12-L1: Broad-based disc bulge mildly flattening the ventral thecal  sac. Mild bilateral facet arthropathy. No evidence of neural  foraminal stenosis. No central canal stenosis.    L1-L2: Mild broad-based disc bulge. Mild bilateral facet  arthropathy. No evidence of neural foraminal stenosis. No central  canal stenosis.    L2-L3: Mild broad-based disc bulge. Moderate bilateral facet  arthropathy. Mild bilateral foraminal stenosis. Mild spinal  stenosis.    L3-L4: Mild broad-based disc bulge flattening the ventral thecal  sac. Moderate bilateral facet arthropathy with ligamentum flavum  infolding. No evidence of neural foraminal stenosis. No central  canal stenosis.    L4-L5: Broad-based disc bulge. Ankylosis of the posterior elements  at L4-5 with hypertrophic changes. Mild bilateral foraminal  stenosis. Mild spinal stenosis.    L5-S1: Broad-based disc bulge. Severe bilateral facet arthropathy  with bilateral facet effusions. Bilateral subarticular recess  stenosis. Severe  left and moderate right foraminal stenosis.    IMPRESSION:  1. Diffuse lumbar spine spondylosis as detailed above.  2. No acute osseous injury of the lumbar spine.  3. No aggressive osseous lesion to suggest metastatic disease.      Electronically Signed  By: Kathreen Devoid  On: 09/07/2019 08:47     Objective:  VS:  HT:    WT:   BMI:     BP:(!) 144/81  HR:79bpm  TEMP: ( )  RESP:  Physical Exam Constitutional:      General: She is not in acute distress.    Appearance: Normal appearance. She is not ill-appearing.  HENT:     Head: Normocephalic and atraumatic.     Right Ear: External ear normal.     Left Ear: External ear normal.  Eyes:     Extraocular Movements: Extraocular movements intact.  Cardiovascular:     Rate and Rhythm: Normal rate.     Pulses: Normal pulses.  Musculoskeletal:     Right lower leg: No edema.     Left lower leg: No edema.     Comments: Patient has good distal strength with no pain over the greater trochanters.  No clonus or focal weakness.  Skin:    Findings: No erythema, lesion or rash.  Neurological:     General: No focal deficit present.     Mental Status: She is alert and oriented to person, place, and time.     Sensory: No sensory deficit.     Motor: No weakness or abnormal muscle tone.     Coordination: Coordination normal.  Psychiatric:        Mood and Affect: Mood normal.        Behavior: Behavior normal.      Imaging: No results found.

## 2020-10-15 ENCOUNTER — Ambulatory Visit: Payer: Medicare PPO | Admitting: Specialist

## 2020-10-22 DIAGNOSIS — Z79899 Other long term (current) drug therapy: Secondary | ICD-10-CM | POA: Diagnosis not present

## 2020-10-22 DIAGNOSIS — M546 Pain in thoracic spine: Secondary | ICD-10-CM | POA: Diagnosis not present

## 2020-10-22 DIAGNOSIS — M542 Cervicalgia: Secondary | ICD-10-CM | POA: Diagnosis not present

## 2020-10-22 DIAGNOSIS — M25361 Other instability, right knee: Secondary | ICD-10-CM | POA: Diagnosis not present

## 2020-10-22 DIAGNOSIS — M5416 Radiculopathy, lumbar region: Secondary | ICD-10-CM | POA: Diagnosis not present

## 2020-10-22 DIAGNOSIS — G8929 Other chronic pain: Secondary | ICD-10-CM | POA: Diagnosis not present

## 2020-11-01 DIAGNOSIS — M1990 Unspecified osteoarthritis, unspecified site: Secondary | ICD-10-CM

## 2020-11-01 HISTORY — DX: Unspecified osteoarthritis, unspecified site: M19.90

## 2020-11-05 DIAGNOSIS — M6281 Muscle weakness (generalized): Secondary | ICD-10-CM | POA: Diagnosis not present

## 2020-11-05 DIAGNOSIS — M79604 Pain in right leg: Secondary | ICD-10-CM | POA: Diagnosis not present

## 2020-11-05 DIAGNOSIS — R262 Difficulty in walking, not elsewhere classified: Secondary | ICD-10-CM | POA: Diagnosis not present

## 2020-11-05 DIAGNOSIS — M5416 Radiculopathy, lumbar region: Secondary | ICD-10-CM | POA: Diagnosis not present

## 2020-11-05 DIAGNOSIS — M4807 Spinal stenosis, lumbosacral region: Secondary | ICD-10-CM | POA: Diagnosis not present

## 2020-11-06 ENCOUNTER — Telehealth: Payer: Self-pay | Admitting: Physical Medicine and Rehabilitation

## 2020-11-06 ENCOUNTER — Telehealth: Payer: Self-pay

## 2020-11-06 NOTE — Telephone Encounter (Signed)
If last helped then can repeat but if  no help then not sure what's left.

## 2020-11-06 NOTE — Telephone Encounter (Signed)
You referred patient to Dr. Otelia Sergeant. Per referral, patient was scheduled for 12/15, but she cancelled that appointment and did not want to reschedule. Please also see phone call from 11/8 that was not sent to you. Does not look like we referred patient to pain management. Please advise.

## 2020-11-06 NOTE — Telephone Encounter (Signed)
She may need to see referring doc. 1) call her to ask how last injection did 2) ask why she cancelled [facility] appointment 3) see what she wants/or has questions about. Thanks.

## 2020-11-06 NOTE — Telephone Encounter (Signed)
See previous message

## 2020-11-06 NOTE — Telephone Encounter (Signed)
Patient states that she cancelled appointment with Dr. Otelia Sergeant because she is going to pain management. She has been to [place]. She wants an injection for her leg pain. I advised that I would send you a message, but we do not have any appointments available in the time frame she wants. I also advised that she call [facility] and see if there is anything they can suggest to help with her pain.

## 2020-11-06 NOTE — Telephone Encounter (Signed)
FYI- it looks like the front desk scheduled this patient with Franky Macho for tomorrow for "back and leg injection." I am going to call her to schedule with Dr. Alvester Morin per his advice, but we do not have openings for injections until the end of the month (I explained this to her earlier.) We will not be able to see her for an injection tomorrow, and her insurance requires authorization.

## 2020-11-06 NOTE — Telephone Encounter (Signed)
Patient called she is requesting a appointment with Dr.Newton today or tomorrow if possible... CB: [phone]

## 2020-11-06 NOTE — Telephone Encounter (Signed)
Just FYI last Rx from [facility] was 90 tablets of Norco 10mg - for 3x per day

## 2020-11-06 NOTE — Telephone Encounter (Signed)
Patient called. She would like an appointment with Dr. Newton. Her call back number is [phone] 

## 2020-11-06 NOTE — Telephone Encounter (Signed)
Thanks so much. 

## 2020-11-07 ENCOUNTER — Ambulatory Visit: Payer: Medicare PPO | Admitting: Surgical

## 2020-11-07 ENCOUNTER — Other Ambulatory Visit: Payer: Self-pay | Admitting: Physical Medicine and Rehabilitation

## 2020-11-07 DIAGNOSIS — Z6836 Body mass index (BMI) 36.0-36.9, adult: Secondary | ICD-10-CM | POA: Diagnosis not present

## 2020-11-07 DIAGNOSIS — M546 Pain in thoracic spine: Secondary | ICD-10-CM | POA: Diagnosis not present

## 2020-11-07 DIAGNOSIS — M5416 Radiculopathy, lumbar region: Secondary | ICD-10-CM | POA: Diagnosis not present

## 2020-11-07 DIAGNOSIS — Z79899 Other long term (current) drug therapy: Secondary | ICD-10-CM | POA: Diagnosis not present

## 2020-11-07 DIAGNOSIS — M48061 Spinal stenosis, lumbar region without neurogenic claudication: Secondary | ICD-10-CM

## 2020-11-07 DIAGNOSIS — M542 Cervicalgia: Secondary | ICD-10-CM | POA: Diagnosis not present

## 2020-11-07 DIAGNOSIS — M25361 Other instability, right knee: Secondary | ICD-10-CM | POA: Diagnosis not present

## 2020-11-07 NOTE — Telephone Encounter (Signed)
Needs auth for left L5 TF- [id number]. Scheduled for 1/26.

## 2020-11-12 DIAGNOSIS — M6281 Muscle weakness (generalized): Secondary | ICD-10-CM | POA: Diagnosis not present

## 2020-11-12 DIAGNOSIS — R262 Difficulty in walking, not elsewhere classified: Secondary | ICD-10-CM | POA: Diagnosis not present

## 2020-11-12 DIAGNOSIS — M5416 Radiculopathy, lumbar region: Secondary | ICD-10-CM | POA: Diagnosis not present

## 2020-11-12 DIAGNOSIS — M79604 Pain in right leg: Secondary | ICD-10-CM | POA: Diagnosis not present

## 2020-11-12 DIAGNOSIS — M4807 Spinal stenosis, lumbosacral region: Secondary | ICD-10-CM | POA: Diagnosis not present

## 2020-11-14 DIAGNOSIS — M4807 Spinal stenosis, lumbosacral region: Secondary | ICD-10-CM | POA: Diagnosis not present

## 2020-11-14 DIAGNOSIS — M5416 Radiculopathy, lumbar region: Secondary | ICD-10-CM | POA: Diagnosis not present

## 2020-11-14 DIAGNOSIS — R262 Difficulty in walking, not elsewhere classified: Secondary | ICD-10-CM | POA: Diagnosis not present

## 2020-11-14 DIAGNOSIS — M79604 Pain in right leg: Secondary | ICD-10-CM | POA: Diagnosis not present

## 2020-11-14 DIAGNOSIS — M6281 Muscle weakness (generalized): Secondary | ICD-10-CM | POA: Diagnosis not present

## 2020-11-19 ENCOUNTER — Other Ambulatory Visit: Payer: Self-pay

## 2020-11-19 ENCOUNTER — Ambulatory Visit
Admission: RE | Admit: 2020-11-19 | Discharge: 2020-11-19 | Disposition: A | Discharge status: Home / Self Care | Payer: Medicare HMO | Source: Ambulatory Visit | Attending: Physical Medicine and Rehabilitation | Admitting: Physical Medicine and Rehabilitation

## 2020-11-19 DIAGNOSIS — M48061 Spinal stenosis, lumbar region without neurogenic claudication: Secondary | ICD-10-CM

## 2020-11-19 DIAGNOSIS — M5416 Radiculopathy, lumbar region: Secondary | ICD-10-CM

## 2020-11-19 IMAGING — XA Imaging study
1 series · 1 of 1 positions shown · non-contrast
Comparison: none

CLINICAL DATA: Lumbar radiculopathy. Good response to L5-S1
transforaminal epidural steroid injection performed at outside
facility. Persistent bilateral symptoms.

[Series 1: ortho adipose · 1 of 1 slices shown]
[im 1/1]
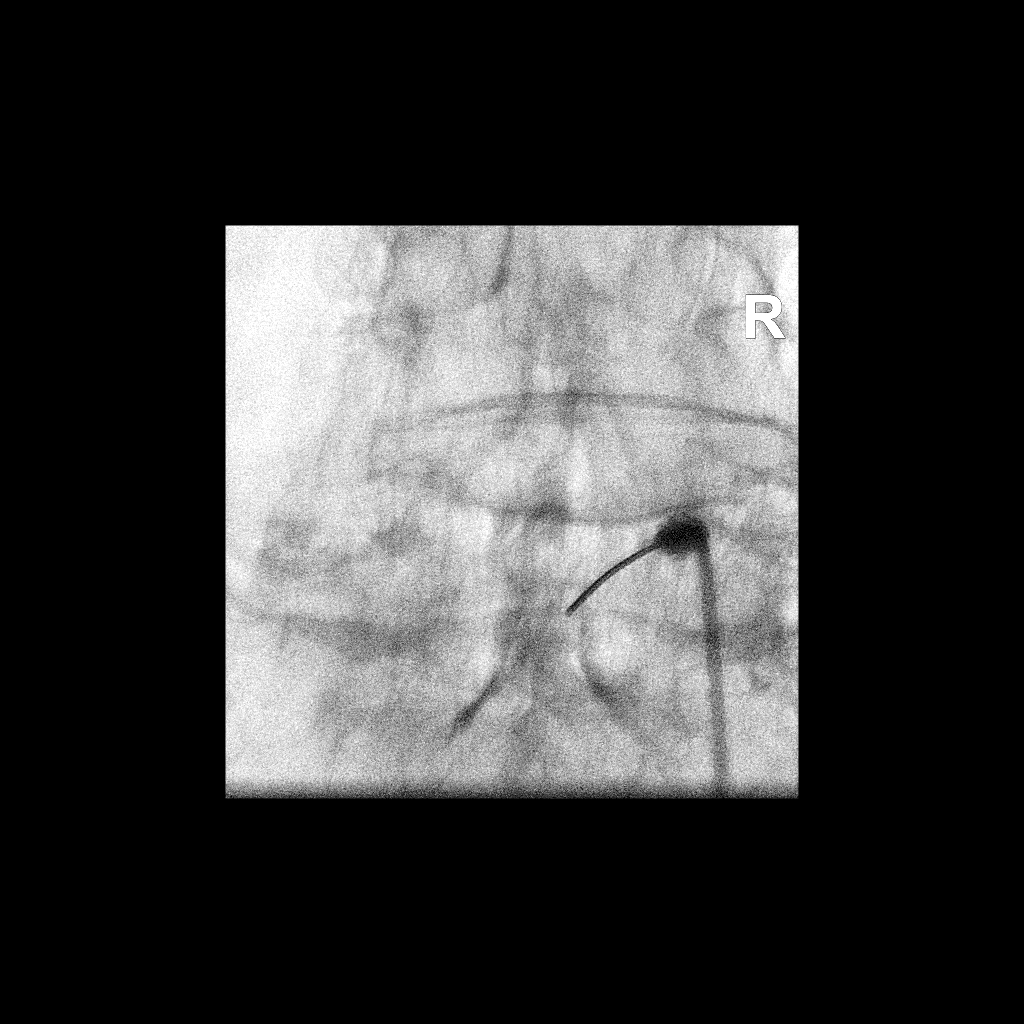

[1 of 1 positions shown; findings below may reference images not displayed]

EXAM:
LUMBAR EPIDURAL INJECTION:

DIAGNOSTIC EPIDURAL INJECTION:

THERAPEUTIC EPIDURAL INJECTION:

PROCEDURE:
The procedure, risks, benefits, and alternatives were explained to
the patient. Questions regarding the procedure were encouraged and
answered. The patient understands and consents to the procedure.

An interlaminar approach was performed on right at L5-S1. The
overlying skin was cleansed and anesthetized. A 20 gauge epidural
needle was advanced using loss-of-resistance technique.

Injection of Isovue-M 200 shows a good epidural pattern with spread
above and below the level of needle placement, extending across the
midline. No vascular opacification is seen.

120mg of Depo-Medrol mixed with 2ml lidocaine 1% were instilled. The
procedure was well-tolerated, and the patient was discharged thirty
minutes following the injection in good condition.

FLUOROSCOPY TIME:  21 seconds; 26 [6B] DAP

COMPLICATIONS:
None immediate
IMPRESSION: Technically successful epidural injection on the right at L5-S1.

## 2020-11-19 MED ORDER — IOPAMIDOL (ISOVUE-M 200) INJECTION 41%
1.0000 mL | Freq: Once | INTRAMUSCULAR | Status: AC
  Administered 2020-11-19: 1 mL via EPIDURAL

## 2020-11-19 MED ORDER — METHYLPREDNISOLONE ACETATE 40 MG/ML INJ SUSP (RADIOLOG
120.0000 mg | Freq: Once | INTRAMUSCULAR | Status: AC
  Administered 2020-11-19: 120 mg via EPIDURAL

## 2020-11-19 NOTE — Discharge Instructions (Signed)

## 2020-11-20 DIAGNOSIS — M5416 Radiculopathy, lumbar region: Secondary | ICD-10-CM | POA: Diagnosis not present

## 2020-11-20 DIAGNOSIS — R262 Difficulty in walking, not elsewhere classified: Secondary | ICD-10-CM | POA: Diagnosis not present

## 2020-11-20 DIAGNOSIS — M4807 Spinal stenosis, lumbosacral region: Secondary | ICD-10-CM | POA: Diagnosis not present

## 2020-11-20 DIAGNOSIS — M6281 Muscle weakness (generalized): Secondary | ICD-10-CM | POA: Diagnosis not present

## 2020-11-20 DIAGNOSIS — M79604 Pain in right leg: Secondary | ICD-10-CM | POA: Diagnosis not present

## 2020-11-21 ENCOUNTER — Inpatient Hospital Stay: Payer: Medicare HMO | Attending: Oncology | Admitting: Oncology

## 2020-11-21 ENCOUNTER — Other Ambulatory Visit (HOSPITAL_COMMUNITY): Payer: Self-pay | Admitting: Oncology

## 2020-11-21 ENCOUNTER — Other Ambulatory Visit: Payer: Self-pay

## 2020-11-21 VITALS — BP 146/80 | HR 89 | Temp 96.7°F | Resp 18 | Ht 65.0 in | Wt 161.9 lb

## 2020-11-21 DIAGNOSIS — C801 Malignant (primary) neoplasm, unspecified: Secondary | ICD-10-CM | POA: Diagnosis not present

## 2020-11-21 DIAGNOSIS — Z85048 Personal history of other malignant neoplasm of rectum, rectosigmoid junction, and anus: Secondary | ICD-10-CM | POA: Diagnosis not present

## 2020-11-21 NOTE — Progress Notes (Signed)
[facility] OFFICE PROGRESS NOTE   Diagnosis: Anal cancer  INTERVAL HISTORY:   [name] returns for scheduled visit. No difficulty with bowel function. No bleeding. She feels well except for chronic back and leg pain. She is followed by orthopedics. She received intermittent epidural steroid injections, last on 11/19/2020. She continues working 3 days/week. She reports undergoing a colonoscopy last year by Dr. Michail Sermon.  Objective:  Vital signs in last 24 hours:  Blood pressure (!) 146/80, pulse 89, temperature (!) 96.7 F (35.9 C), temperature source Tympanic, resp. rate 18, height 5\' 5"  (1.651 m), weight 73.4 kg, SpO2 100 %.    Lymphatics: No cervical, supraclavicular, axillary, or inguinal nodes Resp: Lungs clear bilaterally Cardio: Regular rate and rhythm GI: No mass, nontender, no hepatosplenomegaly Vascular: No leg edema Rectal: Small hemorrhoids/skin tags at the anal verge, anal canal without evidence of recurrent tumor, smooth stricture approximately 2 cm into the anal canal that could not be passed    Lab Results:  Lab Results  Component Value Date   WBC 6.5 01/17/2019   HGB 11.6 (L) 01/17/2019   HCT 34.6 (L) 01/17/2019   MCV 98.3 01/17/2019   PLT 204 01/17/2019   NEUTROABS 5.0 01/17/2019    CMP  Lab Results  Component Value Date   NA 139 01/17/2019   K 3.4 (L) 01/17/2019   CL 101 01/17/2019   CO2 29 01/17/2019   GLUCOSE 133 (H) 01/17/2019   BUN 11 01/17/2019   CREATININE 0.74 01/17/2019   CALCIUM 8.9 01/17/2019   PROT 7.0 01/25/2012   ALBUMIN 3.4 (L) 01/25/2012   AST 21 01/25/2012   ALT 13 01/25/2012   ALKPHOS 84 01/25/2012   BILITOT 0.3 01/25/2012   GFRNONAA >60 01/17/2019   GFRAA >60 01/17/2019    No results found for: CEA1  No results found for: INR  Imaging:  DG INJECT DIAG/THERA/INC NEEDLE/CATH/PLC EPI/LUMB/SAC W/IMG  Result Date: 11/19/2020 CLINICAL DATA:  Lumbar radiculopathy. Good response to L5-S1 transforaminal  epidural steroid injection performed at outside facility. Persistent bilateral symptoms. EXAM: LUMBAR EPIDURAL INJECTION: DIAGNOSTIC EPIDURAL INJECTION: THERAPEUTIC EPIDURAL INJECTION: PROCEDURE: The procedure, risks, benefits, and alternatives were explained to the patient. Questions regarding the procedure were encouraged and answered. The patient understands and consents to the procedure. An interlaminar approach was performed on right at L5-S1. The overlying skin was cleansed and anesthetized. A 20 gauge epidural needle was advanced using loss-of-resistance technique. Injection of Isovue-M 200 shows a good epidural pattern with spread above and below the level of needle placement, extending across the midline. No vascular opacification is seen. 120mg  of Depo-Medrol mixed with 67ml lidocaine 1% were instilled. The procedure was well-tolerated, and the patient was discharged thirty minutes following the injection in good condition. FLUOROSCOPY TIME:  21 seconds; 26 uGym2 DAP COMPLICATIONS: None immediate IMPRESSION: Technically successful epidural injection on the right at L5-S1. Electronically Signed   By: Lucrezia Europe M.D.   On: 11/19/2020 15:42    Medications: I have reviewed the patient's current medications.   Assessment/Plan: 1. Poorly differentiated carcinoma, basaloid squamous cell carcinoma, of the anal canal and posterior vagina. Staging CT scans of the abdomen and pelvis on 11/15/2011 with suspicious perirectal lymph node and no evidence of distant metastatic disease. She began radiation 11/29/2011. She completed cycle 1 mitomycin and 5 fluorouracil 11/29/2011. She completed cycle 2 beginning 12/27/2011. She completed the course of radiation on 01/05/2012. 2. Rectal bleeding secondary to #1. Resolved. 3. Erythema and desquamation related to radiation. Resolved.  4. Anemia. Resolved. 5. Status post placement right upper the PICC line 11/29/2011. The PICC line has been removed. 6. Depression.   7. Status post evaluation in the emergency Department on 02/09/2012 for shortness of breath. Chest CT was negative for evidence of a pulmonary embolism or other acute finding. She has had no further shortness of breath. 8. Rectal stenosis. She is status post an exam under anesthesia with dilatation of the anal canal and biopsies of the posterior vaginal wall and anterior anal wall canal with pathology negative for evidence of malignancy 06/21/2012. Persistent rectal stenosis. She has been evaluated by Dr. Dalbert Batman 9. Hemorrhoids 10. Colonoscopy 08/11/2016-anal stenosis.  Internal hemorrhoids.  Repeat colonoscopy in 3 years for surveillance.   Disposition:  [name] is in remission from anal cancer. She is now 9 years out from diagnosis. She has a good prognosis for continued long-term disease-free survival. She would like to continue follow-up at the Cancer center. She will return for an office visit in 1 year.  We will follow-up on the colonoscopy by Dr. Michail Sermon from 2021.  She will continue follow-up with orthopedics for management of back pain.  Betsy Coder, MD  11/21/2020  12:03 PM

## 2020-11-24 ENCOUNTER — Telehealth: Payer: Self-pay | Admitting: Oncology

## 2020-11-24 NOTE — Telephone Encounter (Signed)
Scheduled appointment per 12/1 los. Mailed updated calendar to patient.  

## 2020-11-26 ENCOUNTER — Ambulatory Visit: Payer: Medicare HMO | Admitting: Physical Medicine and Rehabilitation

## 2020-11-26 DIAGNOSIS — M6281 Muscle weakness (generalized): Secondary | ICD-10-CM | POA: Diagnosis not present

## 2020-11-26 DIAGNOSIS — R262 Difficulty in walking, not elsewhere classified: Secondary | ICD-10-CM | POA: Diagnosis not present

## 2020-11-26 DIAGNOSIS — M5416 Radiculopathy, lumbar region: Secondary | ICD-10-CM | POA: Diagnosis not present

## 2020-11-26 DIAGNOSIS — M4807 Spinal stenosis, lumbosacral region: Secondary | ICD-10-CM | POA: Diagnosis not present

## 2020-11-26 DIAGNOSIS — M79604 Pain in right leg: Secondary | ICD-10-CM | POA: Diagnosis not present

## 2020-11-28 DIAGNOSIS — M5416 Radiculopathy, lumbar region: Secondary | ICD-10-CM | POA: Diagnosis not present

## 2020-11-28 DIAGNOSIS — R262 Difficulty in walking, not elsewhere classified: Secondary | ICD-10-CM | POA: Diagnosis not present

## 2020-11-28 DIAGNOSIS — M4807 Spinal stenosis, lumbosacral region: Secondary | ICD-10-CM | POA: Diagnosis not present

## 2020-11-28 DIAGNOSIS — M79604 Pain in right leg: Secondary | ICD-10-CM | POA: Diagnosis not present

## 2020-11-28 DIAGNOSIS — M6281 Muscle weakness (generalized): Secondary | ICD-10-CM | POA: Diagnosis not present

## 2020-11-30 ENCOUNTER — Inpatient Hospital Stay (HOSPITAL_COMMUNITY)
Admission: EM | Admit: 2020-11-30 | Discharge: 2020-12-06 | DRG: 871 | Disposition: A | Discharge status: Skilled Nursing Facility | Payer: Medicare HMO | Attending: Family Medicine | Admitting: Family Medicine

## 2020-11-30 ENCOUNTER — Emergency Department (HOSPITAL_COMMUNITY): Payer: Medicare HMO

## 2020-11-30 ENCOUNTER — Other Ambulatory Visit: Payer: Self-pay

## 2020-11-30 ENCOUNTER — Emergency Department (HOSPITAL_COMMUNITY)
Admission: EM | Admit: 2020-11-30 | Discharge: 2020-11-30 | Disposition: A | Discharge status: Home / Self Care | Payer: Medicare HMO | Source: Home / Self Care | Attending: Emergency Medicine | Admitting: Emergency Medicine

## 2020-11-30 ENCOUNTER — Encounter (HOSPITAL_COMMUNITY): Payer: Self-pay

## 2020-11-30 ENCOUNTER — Encounter (HOSPITAL_COMMUNITY): Payer: Self-pay | Admitting: Emergency Medicine

## 2020-11-30 DIAGNOSIS — Z741 Need for assistance with personal care: Secondary | ICD-10-CM | POA: Diagnosis not present

## 2020-11-30 DIAGNOSIS — F419 Anxiety disorder, unspecified: Secondary | ICD-10-CM | POA: Diagnosis present

## 2020-11-30 DIAGNOSIS — R300 Dysuria: Secondary | ICD-10-CM | POA: Insufficient documentation

## 2020-11-30 DIAGNOSIS — M5416 Radiculopathy, lumbar region: Secondary | ICD-10-CM | POA: Diagnosis not present

## 2020-11-30 DIAGNOSIS — Z803 Family history of malignant neoplasm of breast: Secondary | ICD-10-CM

## 2020-11-30 DIAGNOSIS — J811 Chronic pulmonary edema: Secondary | ICD-10-CM | POA: Diagnosis not present

## 2020-11-30 DIAGNOSIS — M2550 Pain in unspecified joint: Secondary | ICD-10-CM | POA: Diagnosis not present

## 2020-11-30 DIAGNOSIS — Z85048 Personal history of other malignant neoplasm of rectum, rectosigmoid junction, and anus: Secondary | ICD-10-CM | POA: Insufficient documentation

## 2020-11-30 DIAGNOSIS — M4856XA Collapsed vertebra, not elsewhere classified, lumbar region, initial encounter for fracture: Secondary | ICD-10-CM | POA: Diagnosis not present

## 2020-11-30 DIAGNOSIS — Z923 Personal history of irradiation: Secondary | ICD-10-CM | POA: Diagnosis not present

## 2020-11-30 DIAGNOSIS — Z79899 Other long term (current) drug therapy: Secondary | ICD-10-CM | POA: Insufficient documentation

## 2020-11-30 DIAGNOSIS — Z20822 Contact with and (suspected) exposure to covid-19: Secondary | ICD-10-CM | POA: Diagnosis present

## 2020-11-30 DIAGNOSIS — Z743 Need for continuous supervision: Secondary | ICD-10-CM | POA: Diagnosis not present

## 2020-11-30 DIAGNOSIS — Z87891 Personal history of nicotine dependence: Secondary | ICD-10-CM

## 2020-11-30 DIAGNOSIS — E039 Hypothyroidism, unspecified: Secondary | ICD-10-CM | POA: Diagnosis not present

## 2020-11-30 DIAGNOSIS — U071 COVID-19: Secondary | ICD-10-CM | POA: Diagnosis not present

## 2020-11-30 DIAGNOSIS — I10 Essential (primary) hypertension: Secondary | ICD-10-CM | POA: Diagnosis not present

## 2020-11-30 DIAGNOSIS — I700 Atherosclerosis of aorta: Secondary | ICD-10-CM | POA: Diagnosis not present

## 2020-11-30 DIAGNOSIS — Z79890 Hormone replacement therapy (postmenopausal): Secondary | ICD-10-CM | POA: Diagnosis not present

## 2020-11-30 DIAGNOSIS — D638 Anemia in other chronic diseases classified elsewhere: Secondary | ICD-10-CM | POA: Diagnosis not present

## 2020-11-30 DIAGNOSIS — G8929 Other chronic pain: Secondary | ICD-10-CM | POA: Diagnosis present

## 2020-11-30 DIAGNOSIS — R4182 Altered mental status, unspecified: Secondary | ICD-10-CM | POA: Diagnosis not present

## 2020-11-30 DIAGNOSIS — Z7982 Long term (current) use of aspirin: Secondary | ICD-10-CM | POA: Insufficient documentation

## 2020-11-30 DIAGNOSIS — Z8719 Personal history of other diseases of the digestive system: Secondary | ICD-10-CM

## 2020-11-30 DIAGNOSIS — Z8049 Family history of malignant neoplasm of other genital organs: Secondary | ICD-10-CM | POA: Diagnosis not present

## 2020-11-30 DIAGNOSIS — Z7409 Other reduced mobility: Secondary | ICD-10-CM | POA: Diagnosis not present

## 2020-11-30 DIAGNOSIS — R410 Disorientation, unspecified: Secondary | ICD-10-CM | POA: Diagnosis not present

## 2020-11-30 DIAGNOSIS — E876 Hypokalemia: Secondary | ICD-10-CM | POA: Diagnosis present

## 2020-11-30 DIAGNOSIS — Z791 Long term (current) use of non-steroidal anti-inflammatories (NSAID): Secondary | ICD-10-CM | POA: Diagnosis not present

## 2020-11-30 DIAGNOSIS — M6281 Muscle weakness (generalized): Secondary | ICD-10-CM | POA: Diagnosis not present

## 2020-11-30 DIAGNOSIS — F32A Depression, unspecified: Secondary | ICD-10-CM | POA: Diagnosis not present

## 2020-11-30 DIAGNOSIS — G9341 Metabolic encephalopathy: Secondary | ICD-10-CM | POA: Diagnosis present

## 2020-11-30 DIAGNOSIS — F329 Major depressive disorder, single episode, unspecified: Secondary | ICD-10-CM

## 2020-11-30 DIAGNOSIS — E161 Other hypoglycemia: Secondary | ICD-10-CM | POA: Diagnosis not present

## 2020-11-30 DIAGNOSIS — R509 Fever, unspecified: Secondary | ICD-10-CM | POA: Diagnosis present

## 2020-11-30 DIAGNOSIS — R000 Tachycardia, unspecified: Secondary | ICD-10-CM | POA: Diagnosis not present

## 2020-11-30 DIAGNOSIS — G629 Polyneuropathy, unspecified: Secondary | ICD-10-CM | POA: Diagnosis present

## 2020-11-30 DIAGNOSIS — K648 Other hemorrhoids: Secondary | ICD-10-CM | POA: Diagnosis not present

## 2020-11-30 DIAGNOSIS — R0902 Hypoxemia: Secondary | ICD-10-CM | POA: Diagnosis not present

## 2020-11-30 DIAGNOSIS — R7881 Bacteremia: Secondary | ICD-10-CM | POA: Diagnosis not present

## 2020-11-30 DIAGNOSIS — A4102 Sepsis due to Methicillin resistant Staphylococcus aureus: Secondary | ICD-10-CM | POA: Diagnosis not present

## 2020-11-30 DIAGNOSIS — A4101 Sepsis due to Methicillin susceptible Staphylococcus aureus: Principal | ICD-10-CM | POA: Diagnosis present

## 2020-11-30 DIAGNOSIS — R531 Weakness: Secondary | ICD-10-CM | POA: Diagnosis not present

## 2020-11-30 DIAGNOSIS — N200 Calculus of kidney: Secondary | ICD-10-CM | POA: Diagnosis not present

## 2020-11-30 DIAGNOSIS — C211 Malignant neoplasm of anal canal: Secondary | ICD-10-CM | POA: Diagnosis present

## 2020-11-30 DIAGNOSIS — R079 Chest pain, unspecified: Secondary | ICD-10-CM | POA: Diagnosis not present

## 2020-11-30 DIAGNOSIS — Z7401 Bed confinement status: Secondary | ICD-10-CM | POA: Diagnosis not present

## 2020-11-30 DIAGNOSIS — D649 Anemia, unspecified: Secondary | ICD-10-CM

## 2020-11-30 DIAGNOSIS — R5381 Other malaise: Secondary | ICD-10-CM | POA: Diagnosis not present

## 2020-11-30 DIAGNOSIS — J3489 Other specified disorders of nose and nasal sinuses: Secondary | ICD-10-CM | POA: Diagnosis not present

## 2020-11-30 DIAGNOSIS — Q211 Atrial septal defect: Secondary | ICD-10-CM | POA: Diagnosis not present

## 2020-11-30 DIAGNOSIS — E559 Vitamin D deficiency, unspecified: Secondary | ICD-10-CM | POA: Diagnosis not present

## 2020-11-30 DIAGNOSIS — E785 Hyperlipidemia, unspecified: Secondary | ICD-10-CM | POA: Diagnosis not present

## 2020-11-30 DIAGNOSIS — E162 Hypoglycemia, unspecified: Secondary | ICD-10-CM | POA: Diagnosis not present

## 2020-11-30 DIAGNOSIS — R6510 Systemic inflammatory response syndrome (SIRS) of non-infectious origin without acute organ dysfunction: Secondary | ICD-10-CM | POA: Diagnosis present

## 2020-11-30 DIAGNOSIS — G9340 Encephalopathy, unspecified: Secondary | ICD-10-CM | POA: Diagnosis not present

## 2020-11-30 DIAGNOSIS — B9561 Methicillin susceptible Staphylococcus aureus infection as the cause of diseases classified elsewhere: Secondary | ICD-10-CM | POA: Diagnosis not present

## 2020-11-30 DIAGNOSIS — M549 Dorsalgia, unspecified: Secondary | ICD-10-CM | POA: Diagnosis not present

## 2020-11-30 DIAGNOSIS — F29 Unspecified psychosis not due to a substance or known physiological condition: Secondary | ICD-10-CM | POA: Diagnosis not present

## 2020-11-30 LAB — CBC WITH DIFFERENTIAL/PLATELET
Abs Immature Granulocytes: 0.1 10*3/uL — ABNORMAL HIGH (ref 0.00–0.07)
Abs Immature Granulocytes: 0.08 10*3/uL — ABNORMAL HIGH (ref 0.00–0.07)
Basophils Absolute: 0 10*3/uL (ref 0.0–0.1)
Basophils Absolute: 0 10*3/uL (ref 0.0–0.1)
Basophils Relative: 0 %
Basophils Relative: 0 %
Eosinophils Absolute: 0 10*3/uL (ref 0.0–0.5)
Eosinophils Absolute: 0 10*3/uL (ref 0.0–0.5)
Eosinophils Relative: 0 %
Eosinophils Relative: 0 %
HCT: 31.3 % — ABNORMAL LOW (ref 36.0–46.0)
HCT: 38.2 % (ref 36.0–46.0)
Hemoglobin: 10.5 g/dL — ABNORMAL LOW (ref 12.0–15.0)
Hemoglobin: 12.6 g/dL (ref 12.0–15.0)
Immature Granulocytes: 1 %
Immature Granulocytes: 1 %
Lymphocytes Relative: 3 %
Lymphocytes Relative: 3 %
Lymphs Abs: 0.4 10*3/uL — ABNORMAL LOW (ref 0.7–4.0)
Lymphs Abs: 0.3 10*3/uL — ABNORMAL LOW (ref 0.7–4.0)
MCH: 32.8 pg (ref 26.0–34.0)
MCH: 32.9 pg (ref 26.0–34.0)
MCHC: 33.5 g/dL (ref 30.0–36.0)
MCHC: 33 g/dL (ref 30.0–36.0)
MCV: 97.8 fL (ref 80.0–100.0)
MCV: 99.7 fL (ref 80.0–100.0)
Monocytes Absolute: 0.9 10*3/uL (ref 0.1–1.0)
Monocytes Absolute: 0.4 10*3/uL (ref 0.1–1.0)
Monocytes Relative: 8 %
Monocytes Relative: 3 %
Neutro Abs: 10.9 10*3/uL — ABNORMAL HIGH (ref 1.7–7.7)
Neutro Abs: 9.8 10*3/uL — ABNORMAL HIGH (ref 1.7–7.7)
Neutrophils Relative %: 88 %
Neutrophils Relative %: 93 %
Platelets: 171 10*3/uL (ref 150–400)
Platelets: 188 10*3/uL (ref 150–400)
RBC: 3.2 MIL/uL — ABNORMAL LOW (ref 3.87–5.11)
RBC: 3.83 MIL/uL — ABNORMAL LOW (ref 3.87–5.11)
RDW: 13.6 % (ref 11.5–15.5)
RDW: 13.8 % (ref 11.5–15.5)
WBC: 12.3 10*3/uL — ABNORMAL HIGH (ref 4.0–10.5)
WBC: 10.6 10*3/uL — ABNORMAL HIGH (ref 4.0–10.5)
nRBC: 0 % (ref 0.0–0.2)
nRBC: 0 % (ref 0.0–0.2)

## 2020-11-30 LAB — COMPREHENSIVE METABOLIC PANEL
ALT: 22 U/L (ref 0–44)
AST: 35 U/L (ref 15–41)
Albumin: 3.7 g/dL (ref 3.5–5.0)
Alkaline Phosphatase: 55 U/L (ref 38–126)
Anion gap: 13 (ref 5–15)
BUN: 14 mg/dL (ref 8–23)
CO2: 21 mmol/L — ABNORMAL LOW (ref 22–32)
Calcium: 8.7 mg/dL — ABNORMAL LOW (ref 8.9–10.3)
Chloride: 100 mmol/L (ref 98–111)
Creatinine, Ser: 0.73 mg/dL (ref 0.44–1.00)
GFR, Estimated: >60 mL/min (ref >60)
Glucose, Bld: 149 mg/dL — ABNORMAL HIGH (ref 70–99)
Potassium: 2.8 mmol/L — ABNORMAL LOW (ref 3.5–5.1)
Sodium: 134 mmol/L — ABNORMAL LOW (ref 135–145)
Total Bilirubin: 0.9 mg/dL (ref 0.3–1.2)
Total Protein: 6.5 g/dL (ref 6.5–8.1)

## 2020-11-30 LAB — URINALYSIS, ROUTINE W REFLEX MICROSCOPIC
Bacteria, UA: NONE SEEN
Bilirubin Urine: NEGATIVE
Glucose, UA: NEGATIVE mg/dL
Hgb urine dipstick: NEGATIVE
Ketones, ur: 5 mg/dL — AB
Nitrite: NEGATIVE
Protein, ur: NEGATIVE mg/dL
Specific Gravity, Urine: 1.005 (ref 1.005–1.030)
pH: 6 (ref 5.0–8.0)

## 2020-11-30 LAB — LACTIC ACID, PLASMA
Lactic Acid, Venous: 3.6 mmol/L (ref 0.5–1.9)
Lactic Acid, Venous: 1.6 mmol/L (ref 0.5–1.9)

## 2020-11-30 LAB — PROTIME-INR
INR: 1.1 (ref 0.8–1.2)
INR: 1.3 — ABNORMAL HIGH (ref 0.8–1.2)
Prothrombin Time: 14.1 seconds (ref 11.4–15.2)
Prothrombin Time: 16.1 seconds — ABNORMAL HIGH (ref 11.4–15.2)

## 2020-11-30 LAB — CBC
HCT: 30.8 % — ABNORMAL LOW (ref 36.0–46.0)
Hemoglobin: 9.9 g/dL — ABNORMAL LOW (ref 12.0–15.0)
MCH: 32.4 pg (ref 26.0–34.0)
MCHC: 32.1 g/dL (ref 30.0–36.0)
MCV: 100.7 fL — ABNORMAL HIGH (ref 80.0–100.0)
Platelets: 146 10*3/uL — ABNORMAL LOW (ref 150–400)
RBC: 3.06 MIL/uL — ABNORMAL LOW (ref 3.87–5.11)
RDW: 13.9 % (ref 11.5–15.5)
WBC: 8.5 10*3/uL (ref 4.0–10.5)
nRBC: 0 % (ref 0.0–0.2)

## 2020-11-30 LAB — BASIC METABOLIC PANEL
Anion gap: 15 (ref 5–15)
BUN: 14 mg/dL (ref 8–23)
CO2: 22 mmol/L (ref 22–32)
Calcium: 9.5 mg/dL (ref 8.9–10.3)
Chloride: 102 mmol/L (ref 98–111)
Creatinine, Ser: 0.71 mg/dL (ref 0.44–1.00)
GFR, Estimated: >60 mL/min (ref >60)
Glucose, Bld: 107 mg/dL — ABNORMAL HIGH (ref 70–99)
Potassium: 3.3 mmol/L — ABNORMAL LOW (ref 3.5–5.1)
Sodium: 139 mmol/L (ref 135–145)

## 2020-11-30 LAB — APTT: aPTT: 38 seconds — ABNORMAL HIGH (ref 24–36)

## 2020-11-30 LAB — MAGNESIUM: Magnesium: 1.7 mg/dL (ref 1.7–2.4)

## 2020-11-30 LAB — TROPONIN I (HIGH SENSITIVITY)
Troponin I (High Sensitivity): 14 ng/L (ref <18)
Troponin I (High Sensitivity): 21 ng/L — ABNORMAL HIGH (ref <18)

## 2020-11-30 IMAGING — CT CT ANGIO CHEST
2 of 7 series · 17 of 46 positions shown · IV contrast (omnipaque)
Comparison: CT dated [DATE]

CLINICAL DATA: Pain and fever.

EXAM:
CT ANGIOGRAPHY CHEST
CT ABDOMEN AND PELVIS WITH CONTRAST
TECHNIQUE: Multidetector CT imaging of the chest was performed using the
standard protocol during bolus administration of intravenous
contrast. Multiplanar CT image reconstructions and MIPs were
obtained to evaluate the vascular anatomy. Multidetector CT imaging
of the abdomen and pelvis was performed using the standard protocol
during bolus administration of intravenous contrast.
CONTRAST:  100mL OMNIPAQUE IOHEXOL 350 MG/ML SOLN

[Series 3: thins · axial · 0.73mm/px · z∈[-300,-66]mm · 14 of 268 slices shown]
[im 17/268  lung]
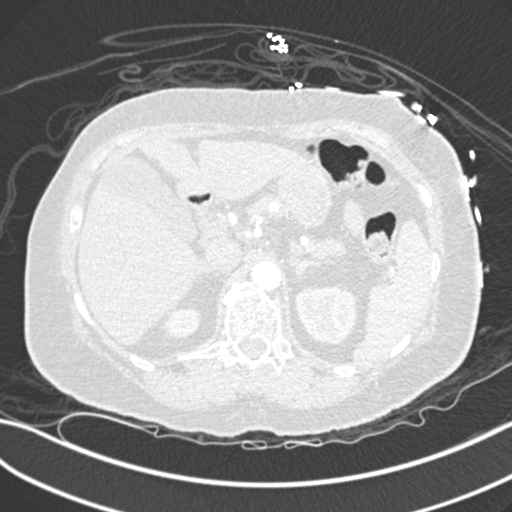
[im 34/268  soft-tissue]
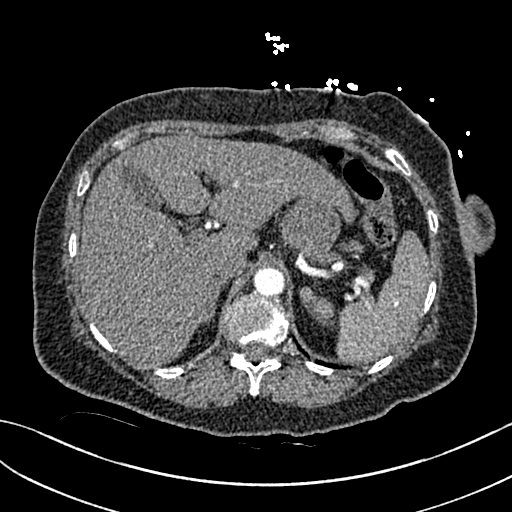
[im 51/268  lung]
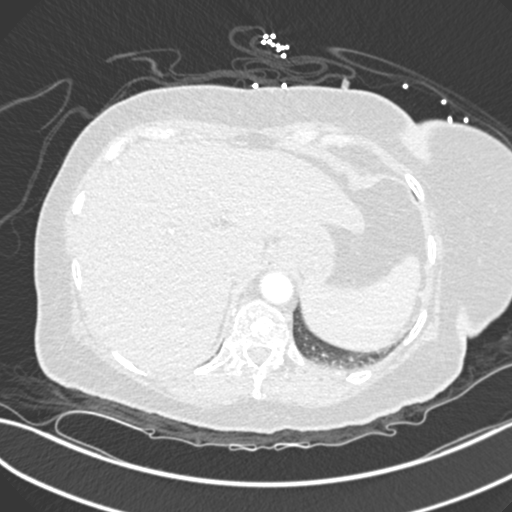
[im 67/268  soft-tissue]
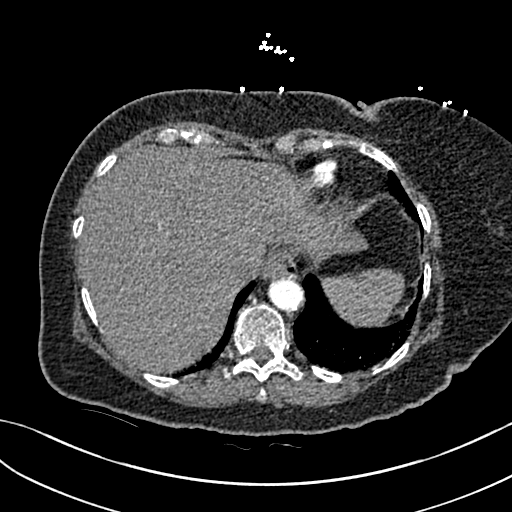
[im 84/268  lung]
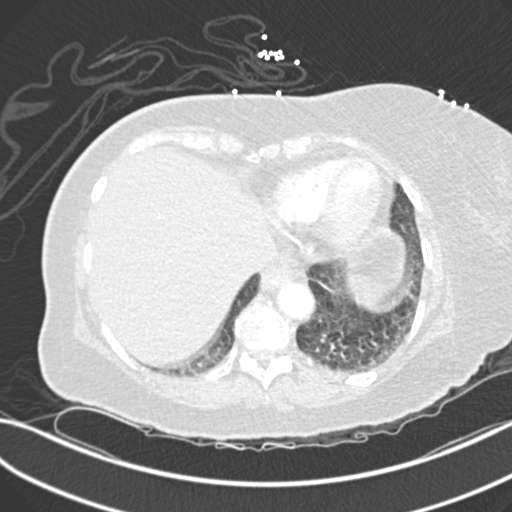
[im 101/268  soft-tissue]
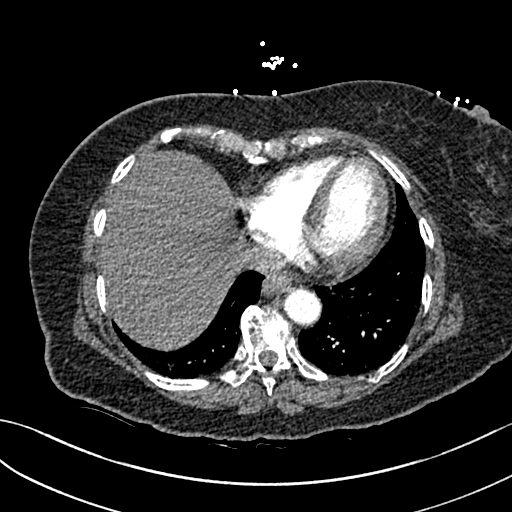
[im 117/268  lung]
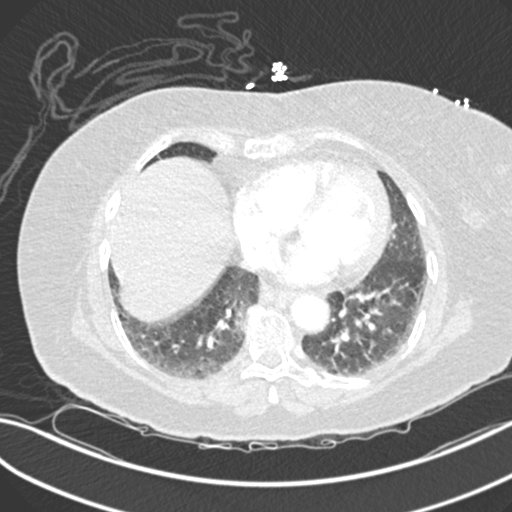
[im 151/268  soft-tissue]
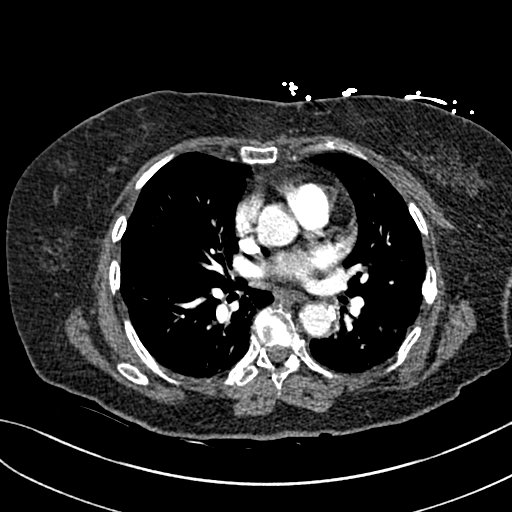
[im 167/268  lung]
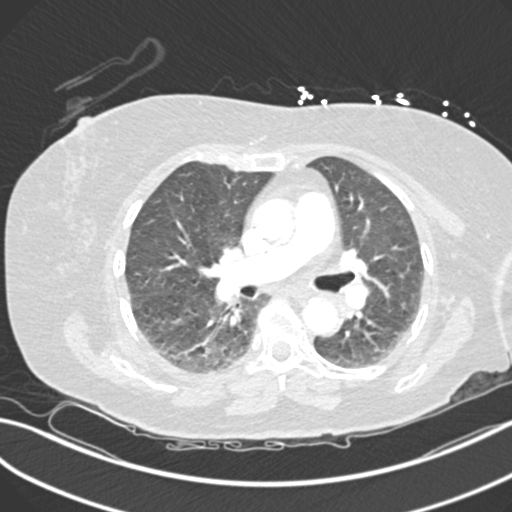
[im 184/268  soft-tissue]
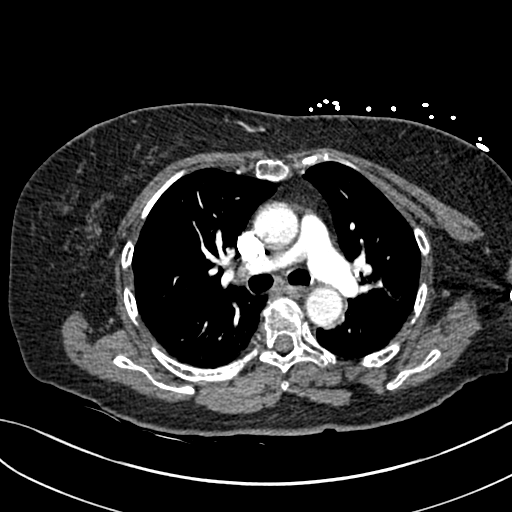
[im 201/268  lung]
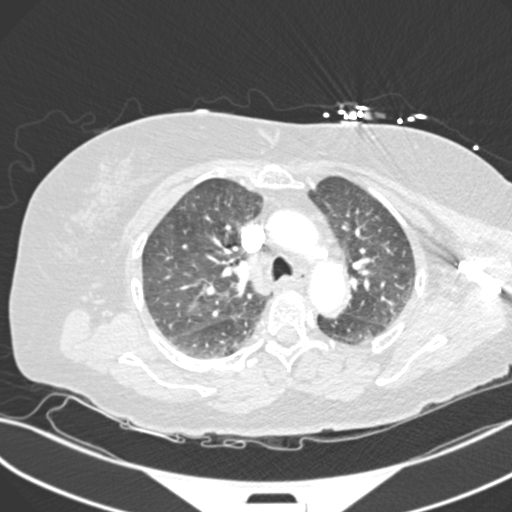
[im 217/268  soft-tissue]
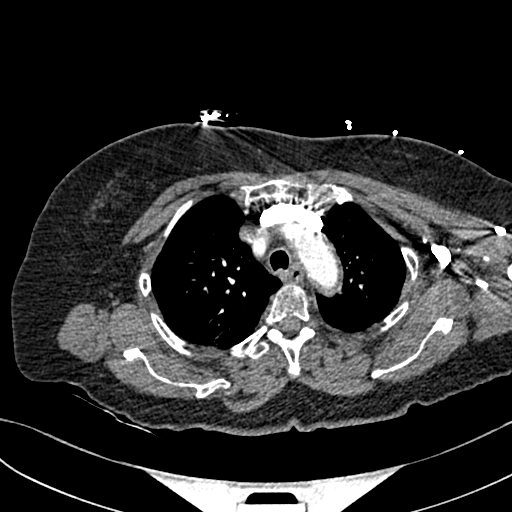
[im 234/268  lung]
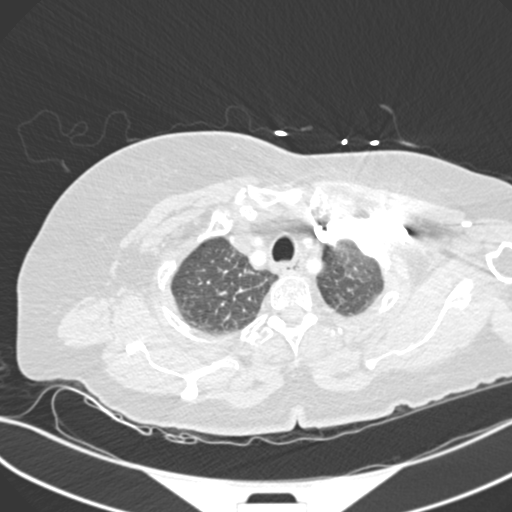
[im 251/268  soft-tissue]
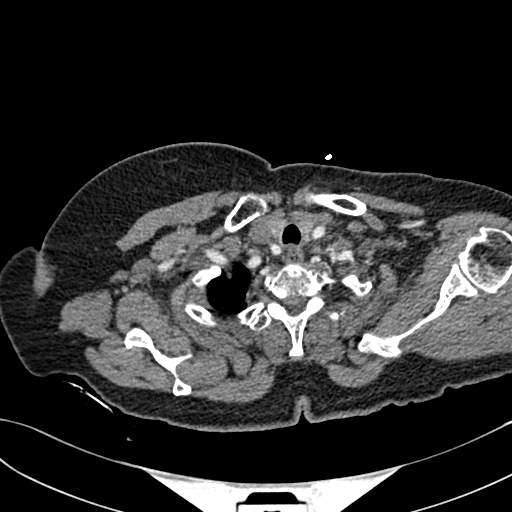

[Series 5: coronal mpr · coronal · 0.56mm/px · 3 of 151 slices shown]
[im 38/151  soft-tissue]
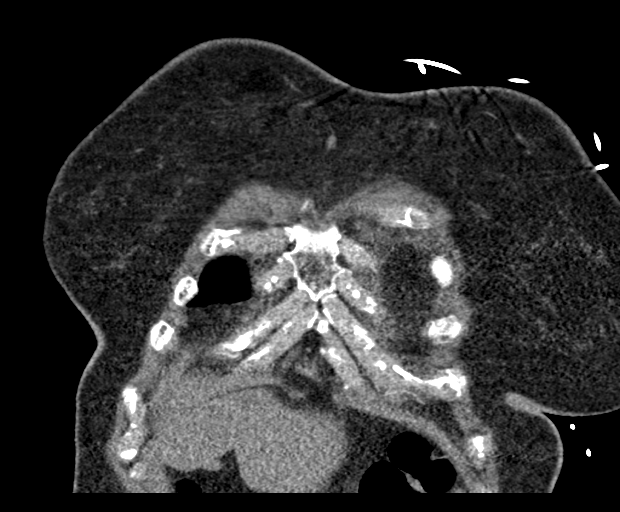
[im 76/151  soft-tissue]
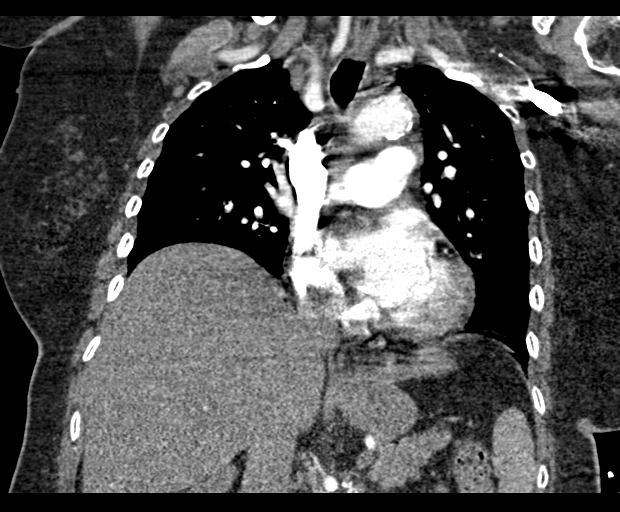
[im 113/151  soft-tissue]
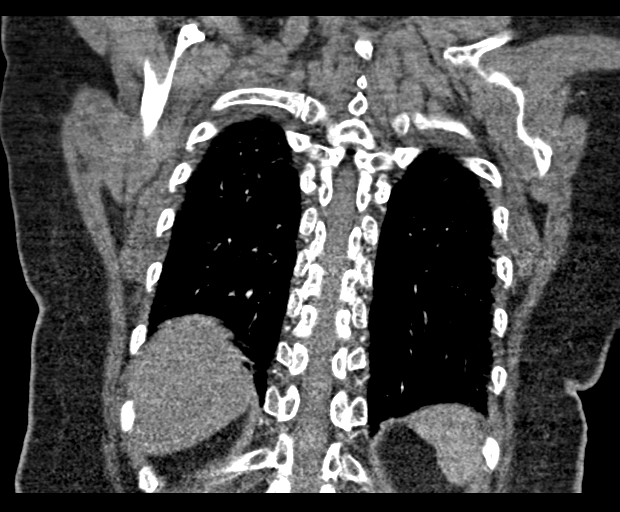

[17 of 46 positions shown; findings below may reference images not displayed]

FINDINGS: CTA CHEST FINDINGS

Cardiovascular: Contrast injection is sufficient to demonstrate
satisfactory opacification of the pulmonary arteries to the
segmental level. There is no pulmonary embolus or evidence of right
heart strain. The size of the main pulmonary artery is normal. Heart
size is normal, with no pericardial effusion. The course and caliber
of the aorta are normal. There is mild atherosclerotic
calcification. Opacification decreased due to pulmonary arterial
phase contrast bolus timing.

Mediastinum/Nodes:

-- No mediastinal lymphadenopathy.

-- No hilar lymphadenopathy.

-- No axillary lymphadenopathy.

-- No supraclavicular lymphadenopathy.

-- Normal thyroid gland where visualized.

-  Unremarkable esophagus.

Lungs/Pleura: Airways are patent. No pleural effusion, lobar
consolidation, pneumothorax or pulmonary infarction.

Musculoskeletal: There are old left-sided rib fractures. No acute
displaced fracture.

Review of the MIP images confirms the above findings.

CT ABDOMEN and PELVIS FINDINGS

Hepatobiliary: The liver is normal. Normal gallbladder.There is no
biliary ductal dilation.

Pancreas: Normal contours without ductal dilatation. No
peripancreatic fluid collection.

Spleen: Unremarkable.

Adrenals/Urinary Tract:

--Adrenal glands: Unremarkable.

--Right kidney/ureter: There is a nonobstructing 4 mm stone in the
interpolar region of the right kidney.

--Left kidney/ureter: No hydronephrosis or radiopaque kidney stones.

--Urinary bladder: The urinary bladder is distended.

Stomach/Bowel:

--Stomach/Duodenum: No hiatal hernia or other gastric abnormality.
Normal duodenal course and caliber.

--Small bowel: Unremarkable.

--Colon: Unremarkable.

--Appendix: Normal.

Vascular/Lymphatic: Atherosclerotic calcification is present within
the non-aneurysmal abdominal aorta, without hemodynamically
significant stenosis.

--No retroperitoneal lymphadenopathy.

--No mesenteric lymphadenopathy.

--No pelvic or inguinal lymphadenopathy.

Reproductive: Status post hysterectomy. No adnexal mass.

Other: No ascites or free air. The abdominal wall is normal.

Musculoskeletal. Compression fractures are again noted of the lumbar
spine, not substantially changed from prior study.

Review of the MIP images confirms the above findings.
IMPRESSION: 1. No acute abnormality.
2. Nonobstructing right nephrolithiasis.
3. Distended urinary bladder.

Aortic Atherosclerosis ([G9]-[G9]).

## 2020-11-30 IMAGING — CT CT RENAL STONE PROTOCOL
2 of 4 series · 16 of 46 positions shown, 18 images · non-contrast
Comparison: CT abdomen dated [DATE]

CLINICAL DATA: Flank pain

EXAM:
CT ABDOMEN AND PELVIS WITHOUT CONTRAST
TECHNIQUE: Multidetector CT imaging of the abdomen and pelvis was performed
following the standard protocol without IV contrast.

[Series 2: axial st · axial · 0.80mm/px · z∈[-498,-118]mm · 13 of 86 slices shown, 15 images]
[im 5/86  soft-tissue]
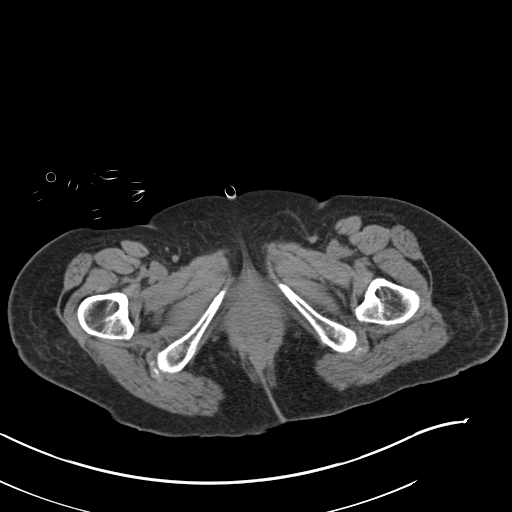
[im 5/86  bone]
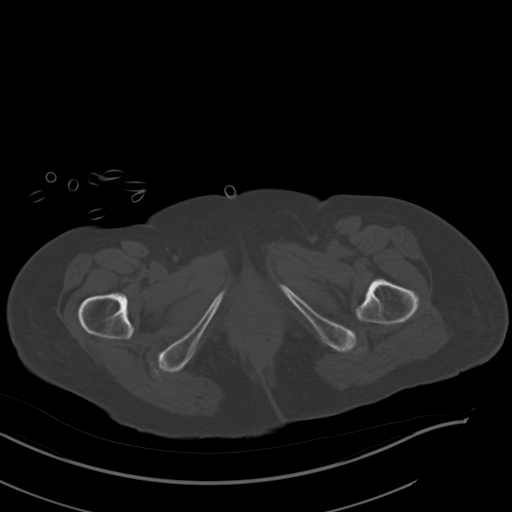
[im 10/86  soft-tissue]
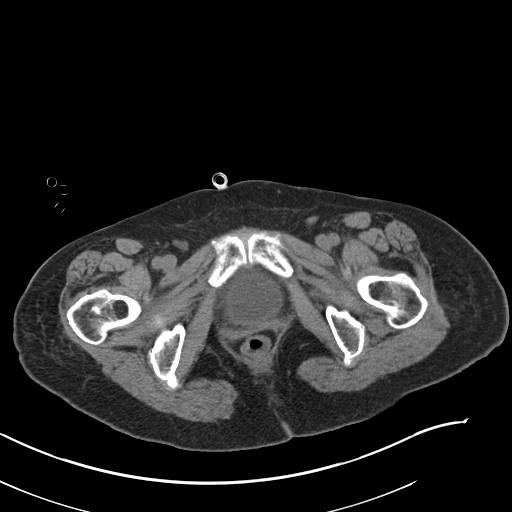
[im 19/86  soft-tissue]
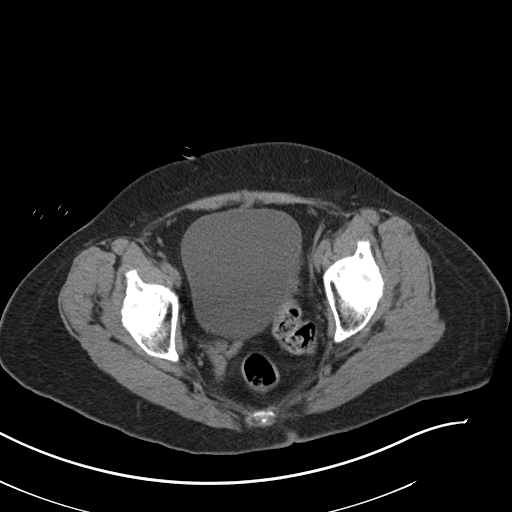
[im 24/86  soft-tissue]
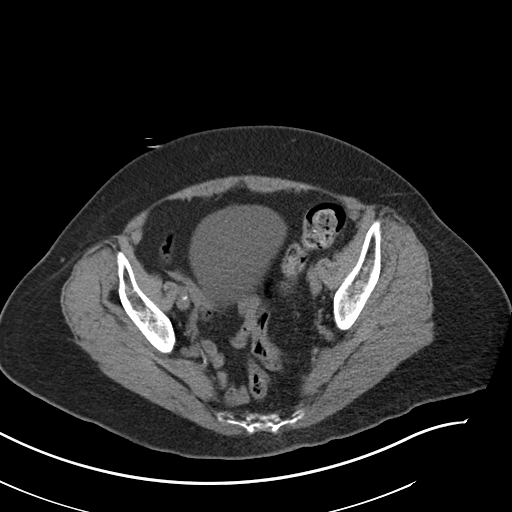
[im 29/86  soft-tissue]
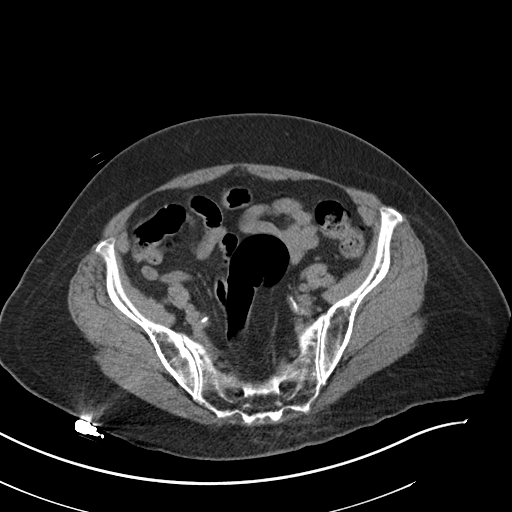
[im 38/86  soft-tissue]
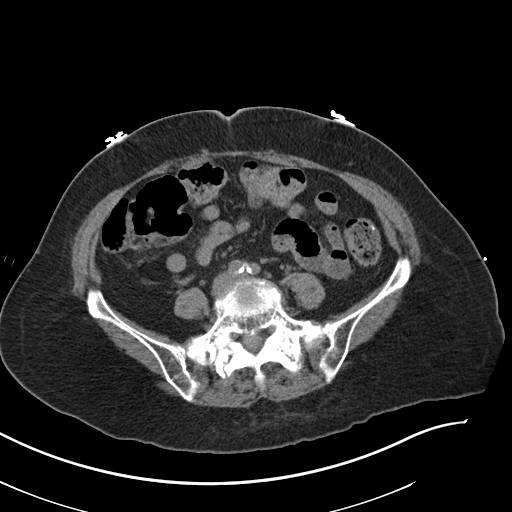
[im 43/86  soft-tissue]
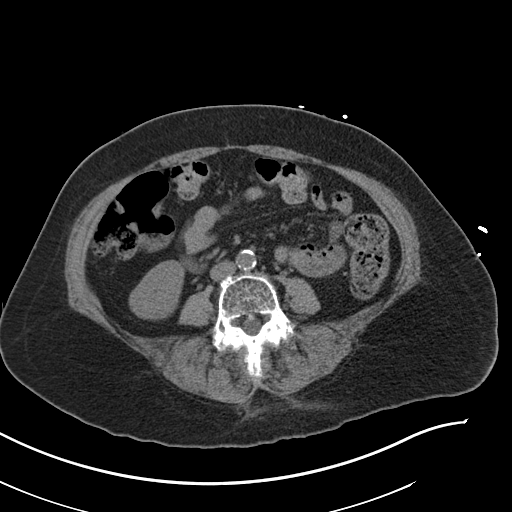
[im 48/86  soft-tissue]
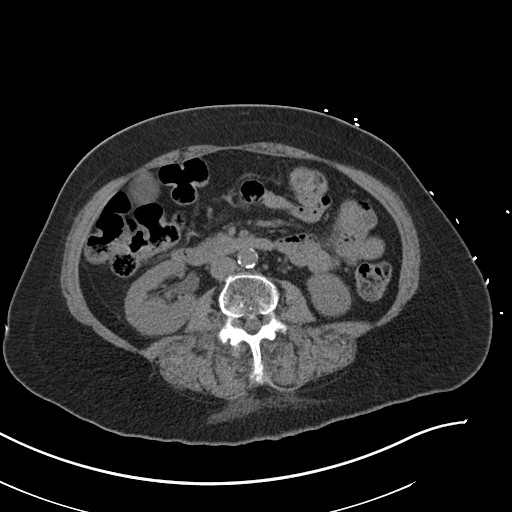
[im 57/86  soft-tissue]
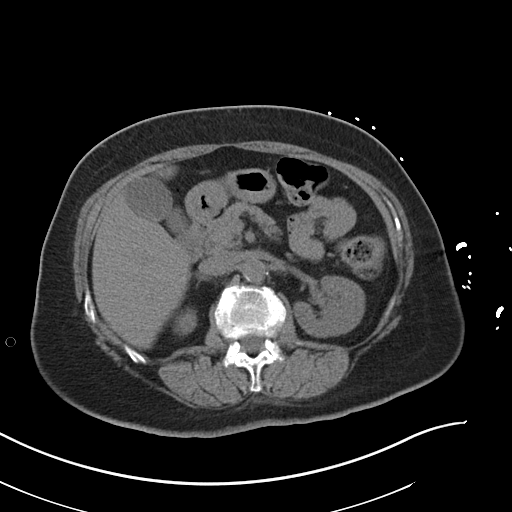
[im 57/86  bone]
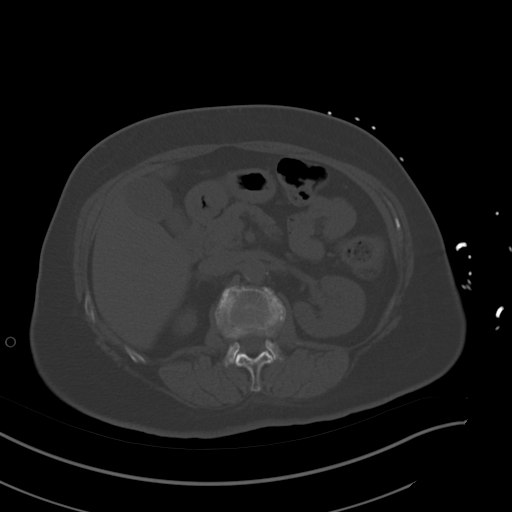
[im 62/86  soft-tissue]
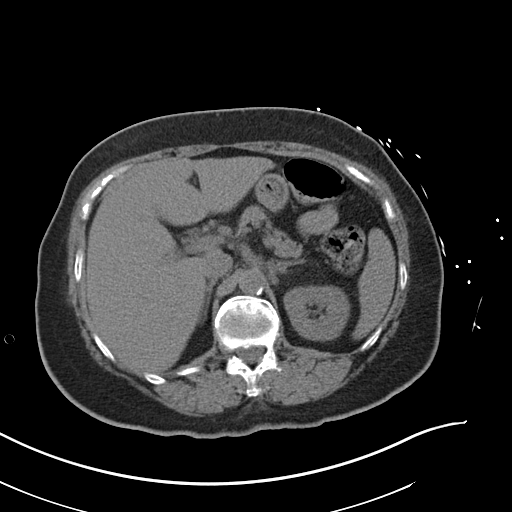
[im 67/86  soft-tissue]
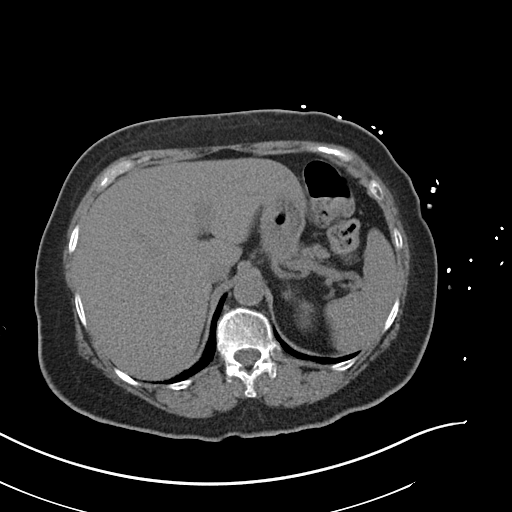
[im 76/86  soft-tissue]
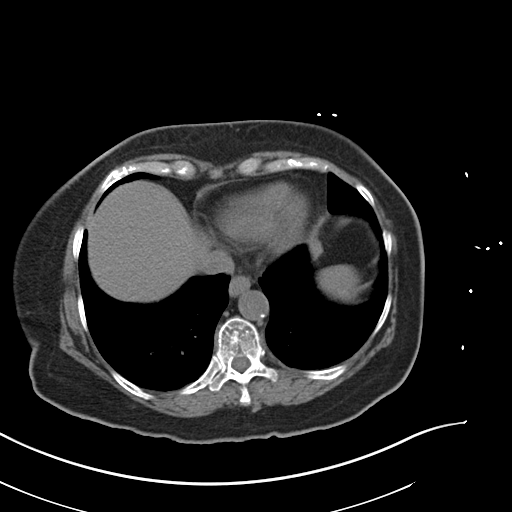
[im 81/86  soft-tissue]
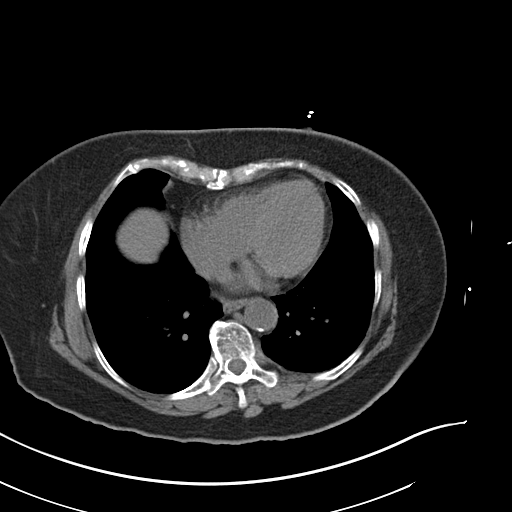

[Series 5: coronal · coronal · 0.76mm/px · 3 of 151 slices shown]
[im 51/151  soft-tissue]
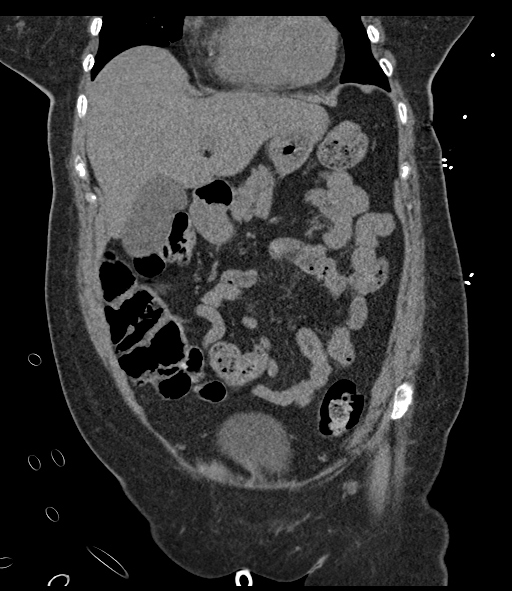
[im 67/151  soft-tissue]
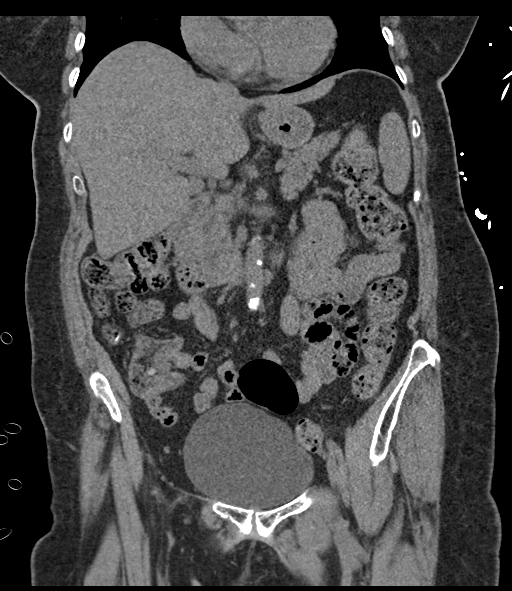
[im 84/151  soft-tissue]
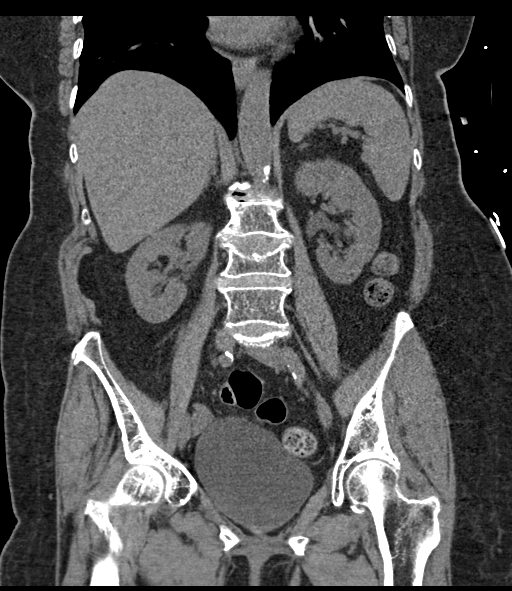

[16 of 46 positions shown; findings below may reference images not displayed]

FINDINGS: Lower chest: No acute abnormality.

Hepatobiliary: No focal liver abnormality is seen. Gallbladder is
unremarkable. No bile duct dilatation.

Pancreas: Unremarkable. No pancreatic ductal dilatation or
surrounding inflammatory changes.

Spleen: Normal in size without focal abnormality.

Adrenals/Urinary Tract: Adrenal glands are unremarkable. 4 mm RIGHT
renal stone. Kidneys otherwise unremarkable without hydronephrosis
or perinephric inflammation. No ureteral or bladder calculi. Bladder
is unremarkable.

Stomach/Bowel: No dilated large or small bowel loops. No evidence of
bowel wall inflammation. Appendix is not seen but there are no
inflammatory changes about the cecum to suggest acute appendicitis.
Stomach is unremarkable, partially decompressed.

Vascular/Lymphatic: Aortic atherosclerosis. No enlarged lymph nodes
are seen within the abdomen or pelvis.

Reproductive: Presumed hysterectomy.  No adnexal mass or free fluid.

Other: No free fluid or abscess collection. No free intraperitoneal
air.

Musculoskeletal: No acute-appearing osseous abnormality. Stable
chronic compression fracture deformities of the L1, L3 and L5
vertebral bodies.
IMPRESSION: 1. No acute findings within the abdomen or pelvis. No bowel
obstruction or evidence of bowel wall inflammation. No evidence of
acute solid organ abnormality. No free fluid or inflammatory change.
2. 4 mm nonobstructing RIGHT renal stone. No ureteral or bladder
calculi.
3. Chronic compression fracture deformities of the L1, L3 and L5
vertebral bodies, not significantly changed compared to earlier CT
of [DATE].

Aortic Atherosclerosis ([SO]-[SO]).

## 2020-11-30 IMAGING — CT CT ABD-PELV W/ CM
2 of 5 series · 15 of 46 positions shown, 17 images · IV contrast (omnipaque)
Comparison: CT dated [DATE]

CLINICAL DATA: Pain and fever.

EXAM:
CT ANGIOGRAPHY CHEST
CT ABDOMEN AND PELVIS WITH CONTRAST
TECHNIQUE: Multidetector CT imaging of the chest was performed using the
standard protocol during bolus administration of intravenous
contrast. Multiplanar CT image reconstructions and MIPs were
obtained to evaluate the vascular anatomy. Multidetector CT imaging
of the abdomen and pelvis was performed using the standard protocol
during bolus administration of intravenous contrast.
CONTRAST:  100mL OMNIPAQUE IOHEXOL 350 MG/ML SOLN

[Series 5: axial st · axial · 0.71mm/px · z∈[-604,-209]mm · 12 of 93 slices shown, 14 images]
[im 7/93  soft-tissue]
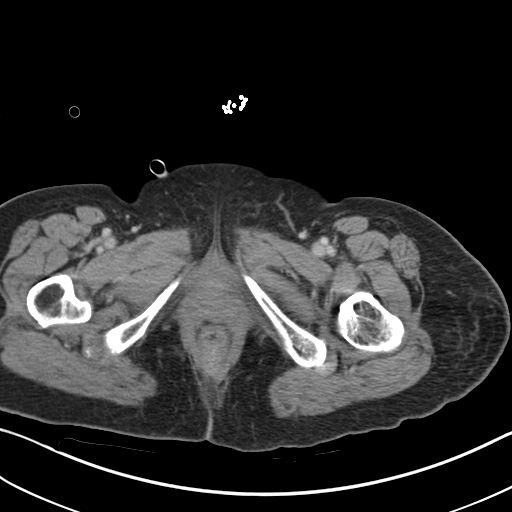
[im 7/93  bone]
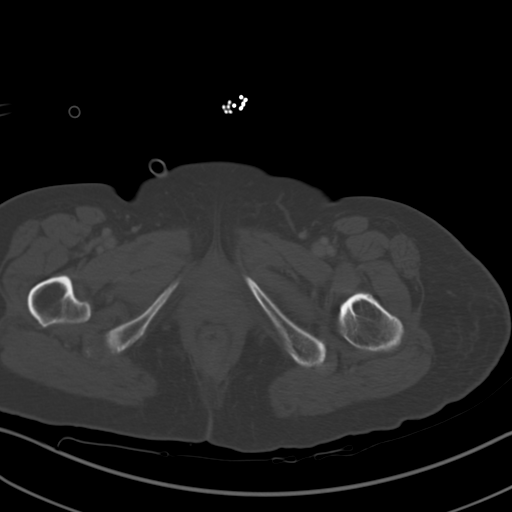
[im 14/93  soft-tissue]
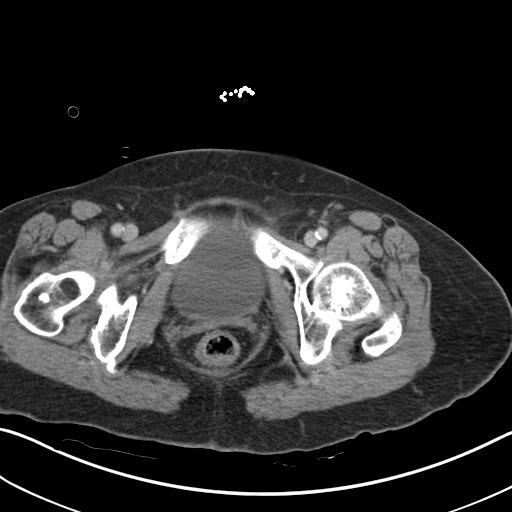
[im 20/93  soft-tissue]
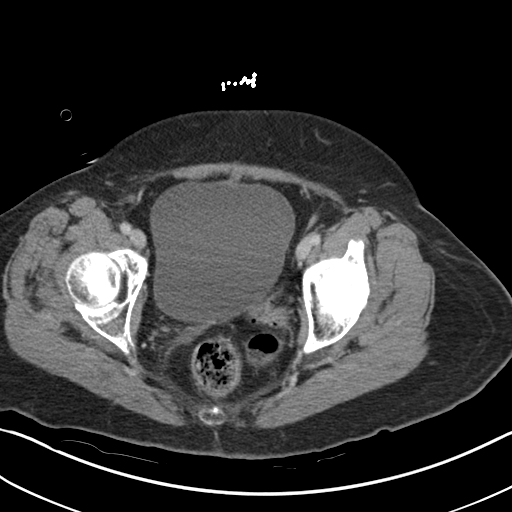
[im 27/93  soft-tissue]
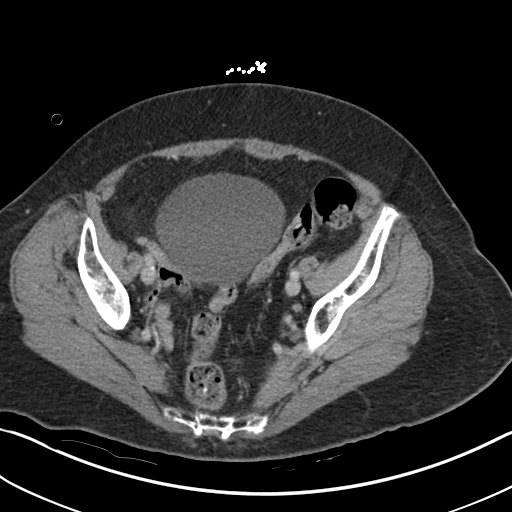
[im 33/93  soft-tissue]
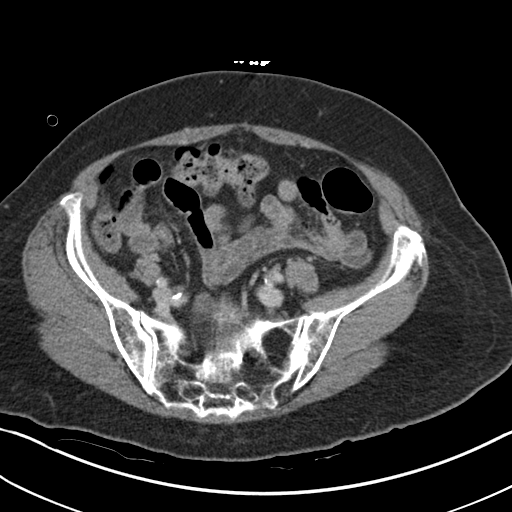
[im 40/93  soft-tissue]
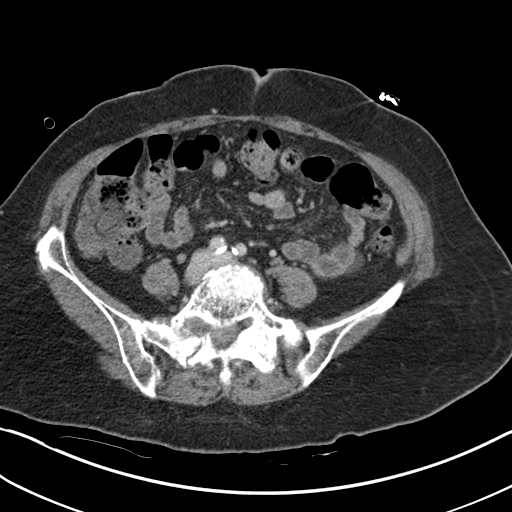
[im 53/93  soft-tissue]
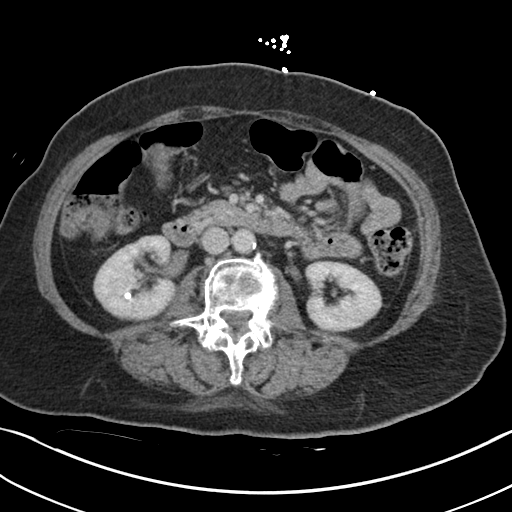
[im 60/93  soft-tissue]
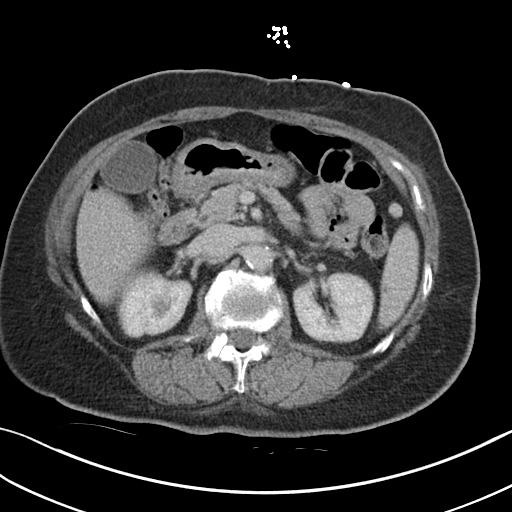
[im 66/93  soft-tissue]
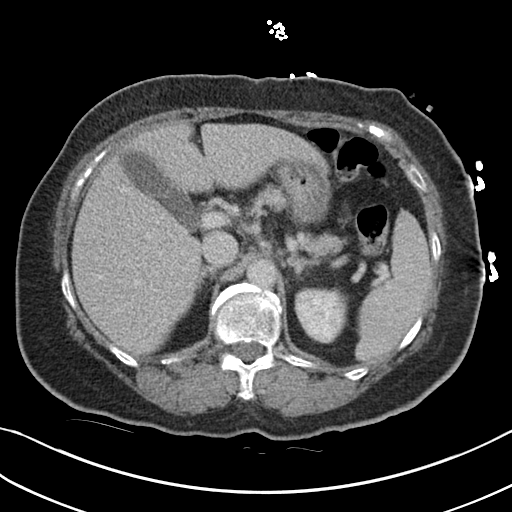
[im 66/93  bone]
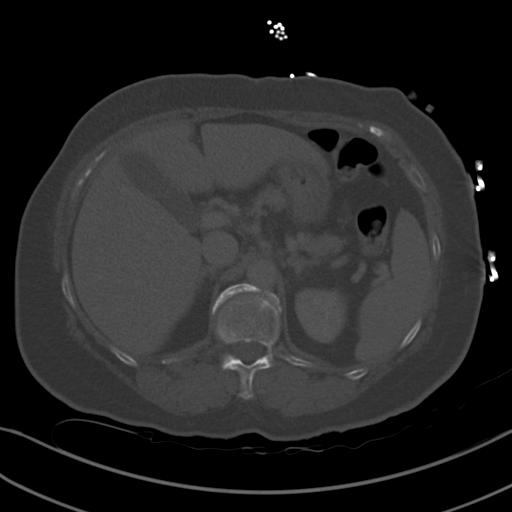
[im 73/93  soft-tissue]
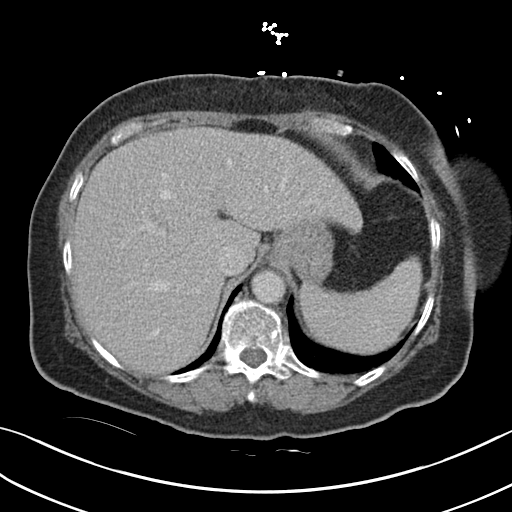
[im 79/93  soft-tissue]
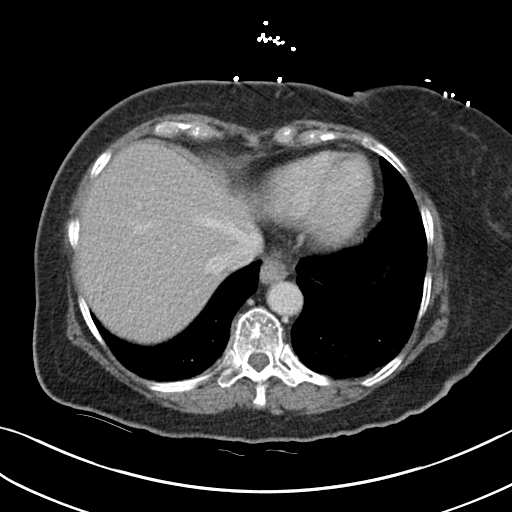
[im 86/93  soft-tissue]
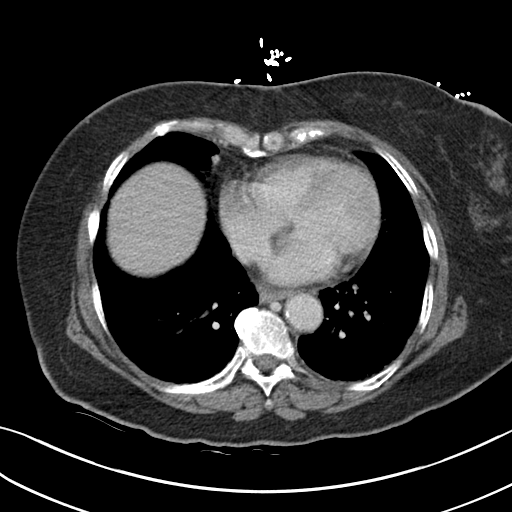

[Series 9: coronal st · coronal · 0.80mm/px · 3 of 101 slices shown]
[im 34/101  soft-tissue]
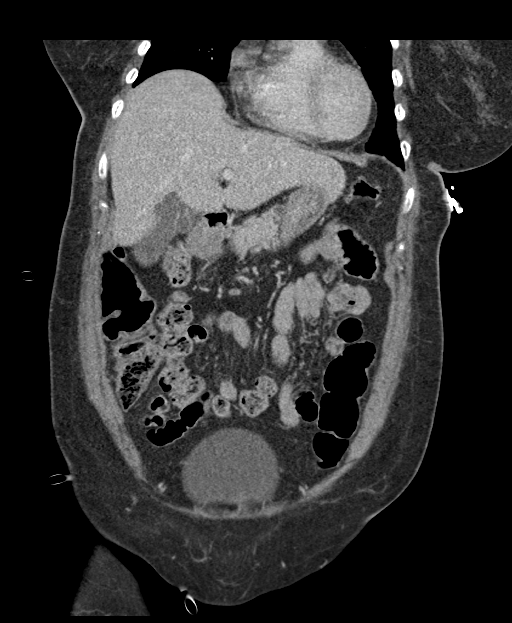
[im 45/101  soft-tissue]
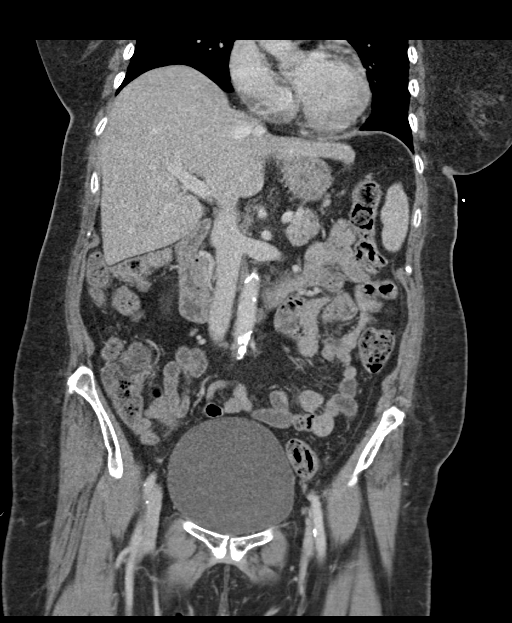
[im 56/101  soft-tissue]
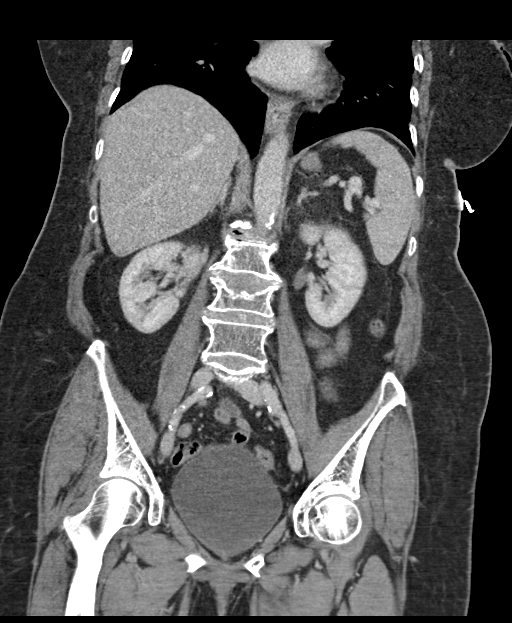

[15 of 46 positions shown; findings below may reference images not displayed]

FINDINGS: CTA CHEST FINDINGS

Cardiovascular: Contrast injection is sufficient to demonstrate
satisfactory opacification of the pulmonary arteries to the
segmental level. There is no pulmonary embolus or evidence of right
heart strain. The size of the main pulmonary artery is normal. Heart
size is normal, with no pericardial effusion. The course and caliber
of the aorta are normal. There is mild atherosclerotic
calcification. Opacification decreased due to pulmonary arterial
phase contrast bolus timing.

Mediastinum/Nodes:

-- No mediastinal lymphadenopathy.

-- No hilar lymphadenopathy.

-- No axillary lymphadenopathy.

-- No supraclavicular lymphadenopathy.

-- Normal thyroid gland where visualized.

-  Unremarkable esophagus.

Lungs/Pleura: Airways are patent. No pleural effusion, lobar
consolidation, pneumothorax or pulmonary infarction.

Musculoskeletal: There are old left-sided rib fractures. No acute
displaced fracture.

Review of the MIP images confirms the above findings.

CT ABDOMEN and PELVIS FINDINGS

Hepatobiliary: The liver is normal. Normal gallbladder.There is no
biliary ductal dilation.

Pancreas: Normal contours without ductal dilatation. No
peripancreatic fluid collection.

Spleen: Unremarkable.

Adrenals/Urinary Tract:

--Adrenal glands: Unremarkable.

--Right kidney/ureter: There is a nonobstructing 4 mm stone in the
interpolar region of the right kidney.

--Left kidney/ureter: No hydronephrosis or radiopaque kidney stones.

--Urinary bladder: The urinary bladder is distended.

Stomach/Bowel:

--Stomach/Duodenum: No hiatal hernia or other gastric abnormality.
Normal duodenal course and caliber.

--Small bowel: Unremarkable.

--Colon: Unremarkable.

--Appendix: Normal.

Vascular/Lymphatic: Atherosclerotic calcification is present within
the non-aneurysmal abdominal aorta, without hemodynamically
significant stenosis.

--No retroperitoneal lymphadenopathy.

--No mesenteric lymphadenopathy.

--No pelvic or inguinal lymphadenopathy.

Reproductive: Status post hysterectomy. No adnexal mass.

Other: No ascites or free air. The abdominal wall is normal.

Musculoskeletal. Compression fractures are again noted of the lumbar
spine, not substantially changed from prior study.

Review of the MIP images confirms the above findings.
IMPRESSION: 1. No acute abnormality.
2. Nonobstructing right nephrolithiasis.
3. Distended urinary bladder.

Aortic Atherosclerosis ([G9]-[G9]).

## 2020-11-30 IMAGING — CT CT HEAD W/O CM
3 of 4 series · 14 of 47 positions shown, 16 images · non-contrast
Comparison: None.

CLINICAL DATA: Delirium.  Discharged today.

EXAM:
CT HEAD WITHOUT CONTRAST
TECHNIQUE: Contiguous axial images were obtained from the base of the skull
through the vertex without intravenous contrast.

[Series 5: coronal soft tissue · coronal · 0.35mm/px · 3 of 72 slices shown]
[im 24/72  brain]
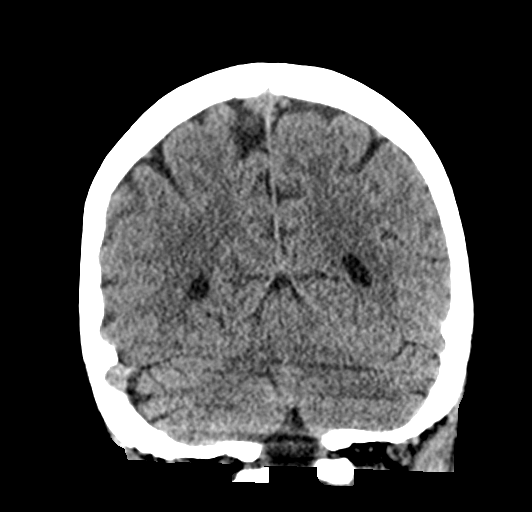
[im 32/72  brain]
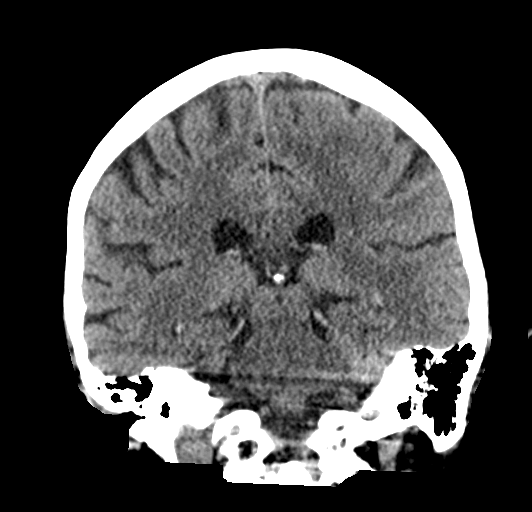
[im 40/72  brain]
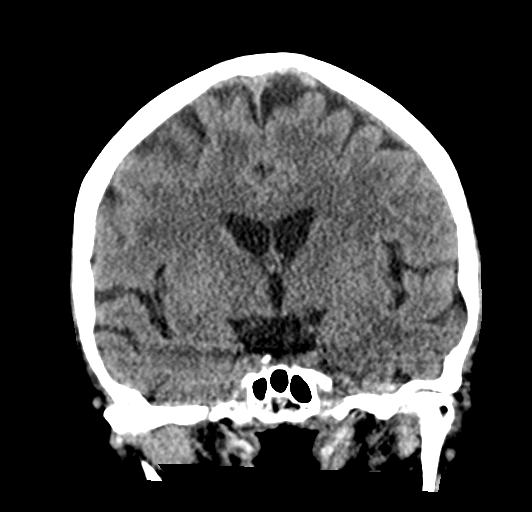

[Series 6: sagittal soft tissue · sagittal · 0.38mm/px · 3 of 57 slices shown]
[im 19/57  brain]
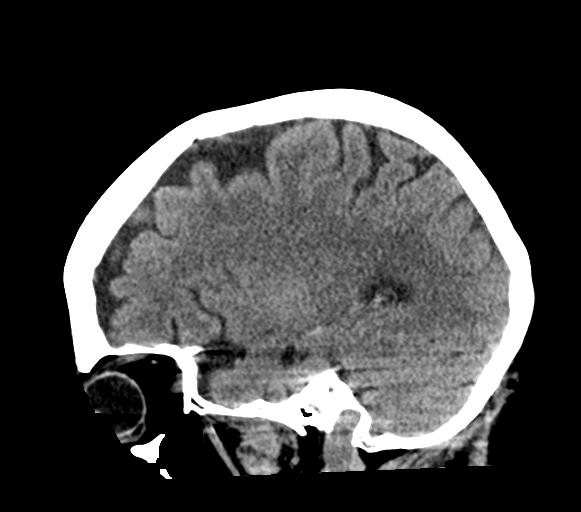
[im 29/57  brain]
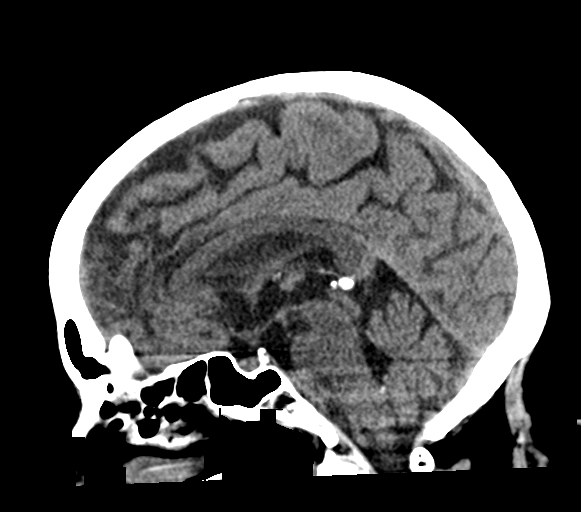
[im 38/57  brain]
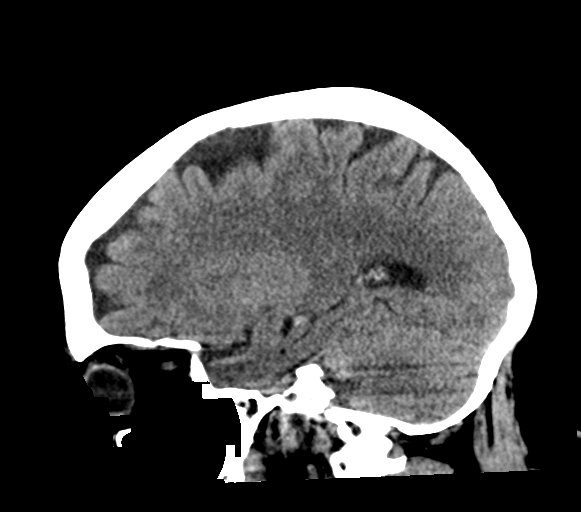

[Series 7: true axial · axial · 0.34mm/px · z∈[-129,+8]mm · 8 of 61 slices shown, 10 images]
[im 7/61  brain]
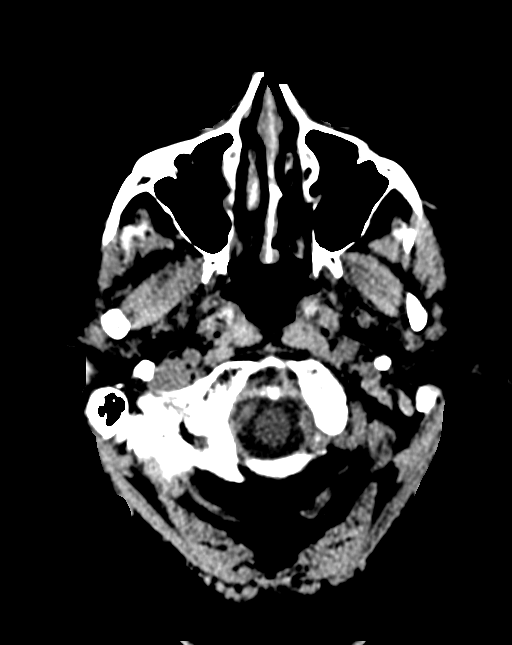
[im 7/61  bone]
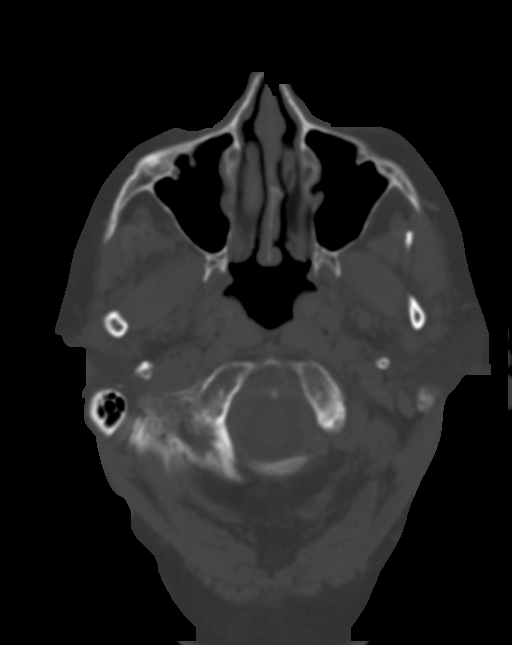
[im 14/61  brain]
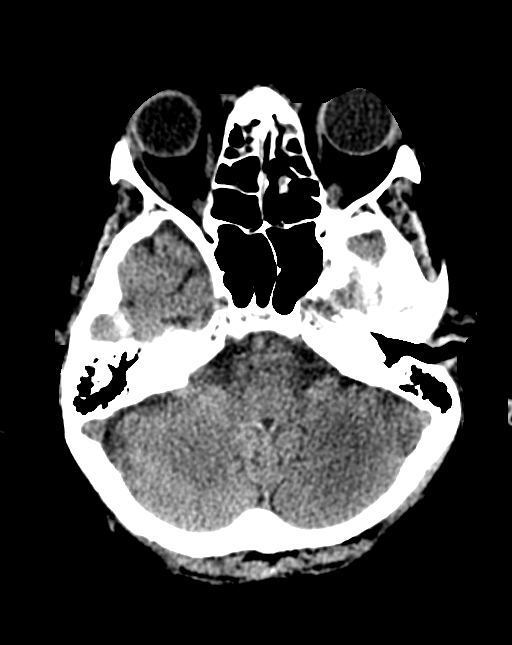
[im 21/61  brain]
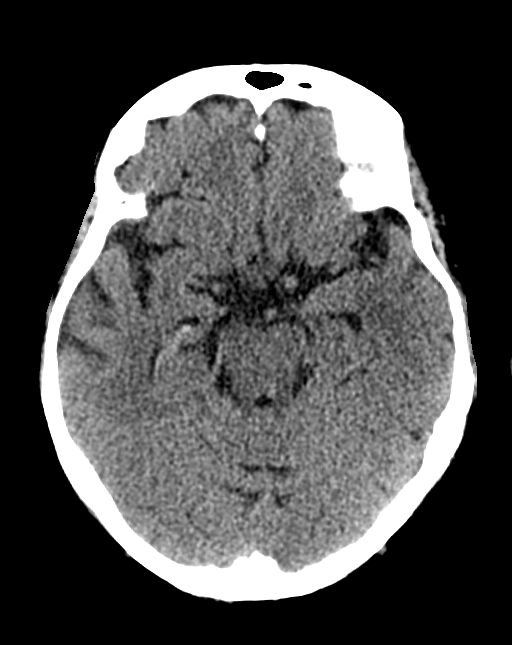
[im 27/61  brain]
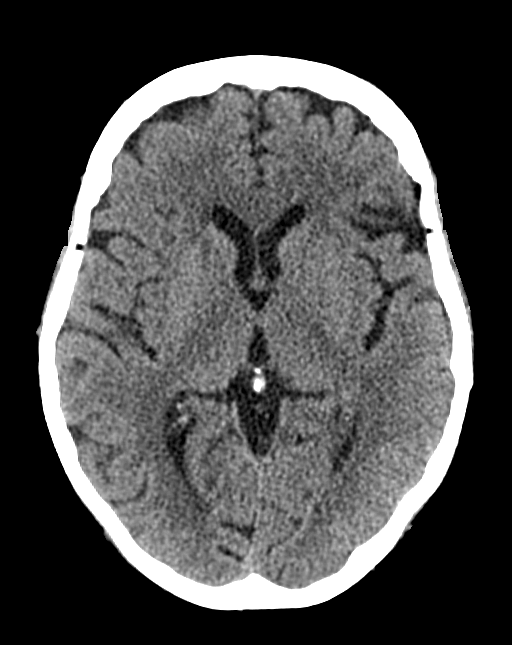
[im 34/61  brain]
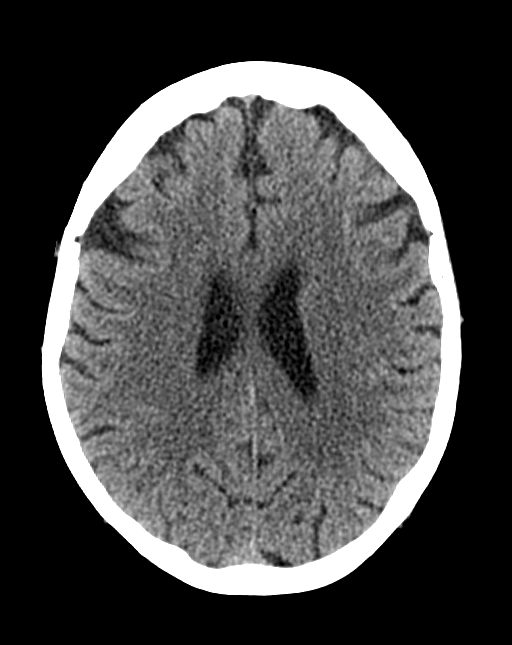
[im 34/61  bone]
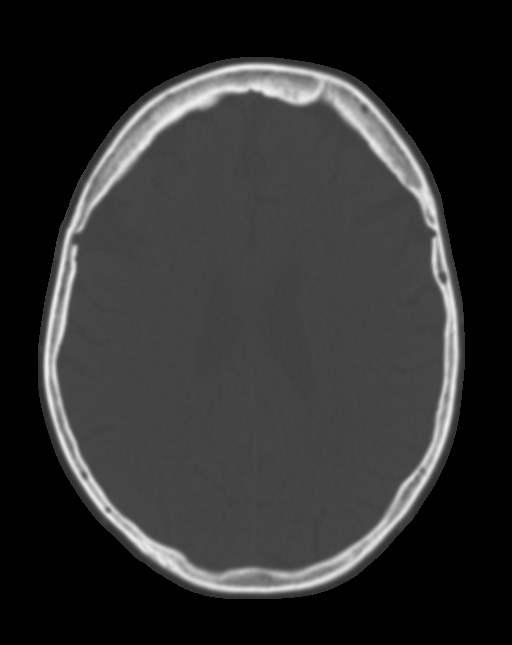
[im 41/61  brain]
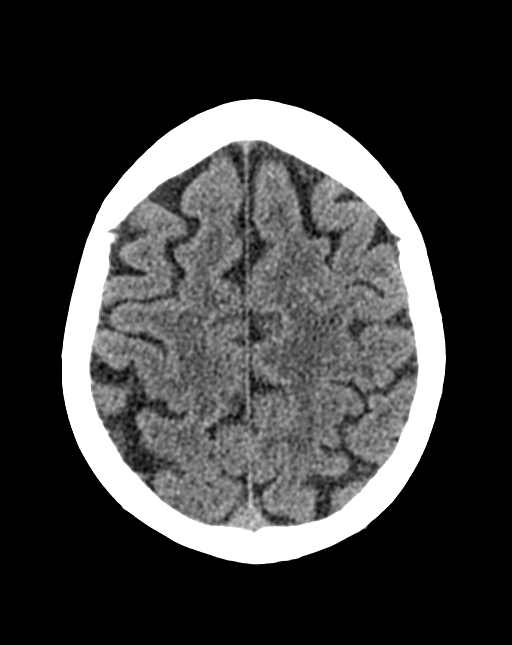
[im 47/61  brain]
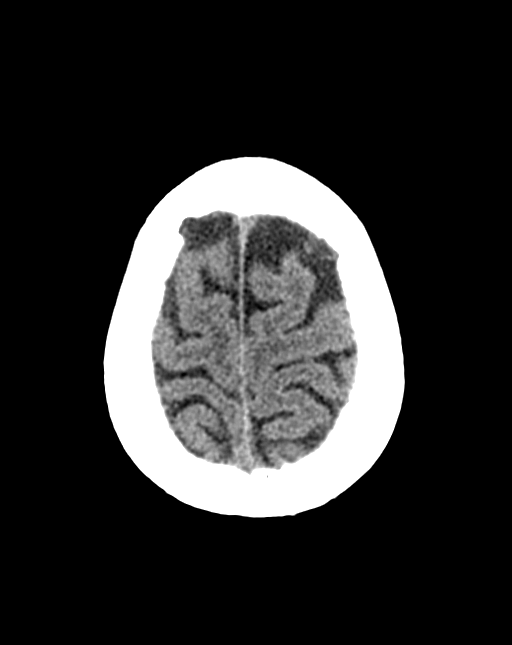
[im 54/61  brain]
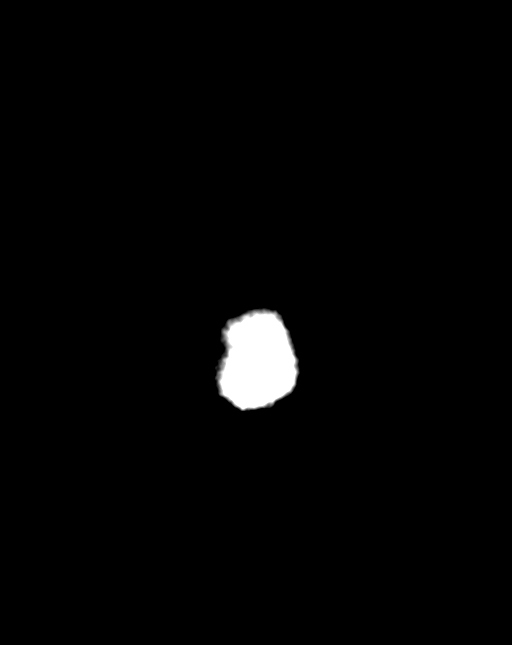

[14 of 47 positions shown; findings below may reference images not displayed]

FINDINGS: Brain: Normal for age atrophy. No intracranial hemorrhage, mass
effect, or midline shift. No hydrocephalus. The basilar cisterns are
patent. No evidence of territorial infarct or acute ischemia. No
extra-axial or intracranial fluid collection.

Vascular: Atherosclerosis of skullbase vasculature without
hyperdense vessel or abnormal calcification.

Skull: No fracture or focal lesion.

Sinuses/Orbits: Minor mucosal thickening of ethmoid air cells. No
sinus air-fluid levels. Mastoid air cells are clear. Orbits are
unremarkable.

Other: None.
IMPRESSION: 1. No acute intracranial abnormality.
2. Normal for age atrophy.

## 2020-11-30 IMAGING — DX DG CHEST 1V PORT
1 series · 1 of 1 positions shown · non-contrast
Comparison: CT [DATE], radiograph [DATE]

CLINICAL DATA: Fever, hypokalemia, altered mental status

EXAM:
PORTABLE CHEST 1 VIEW

[chest ap]
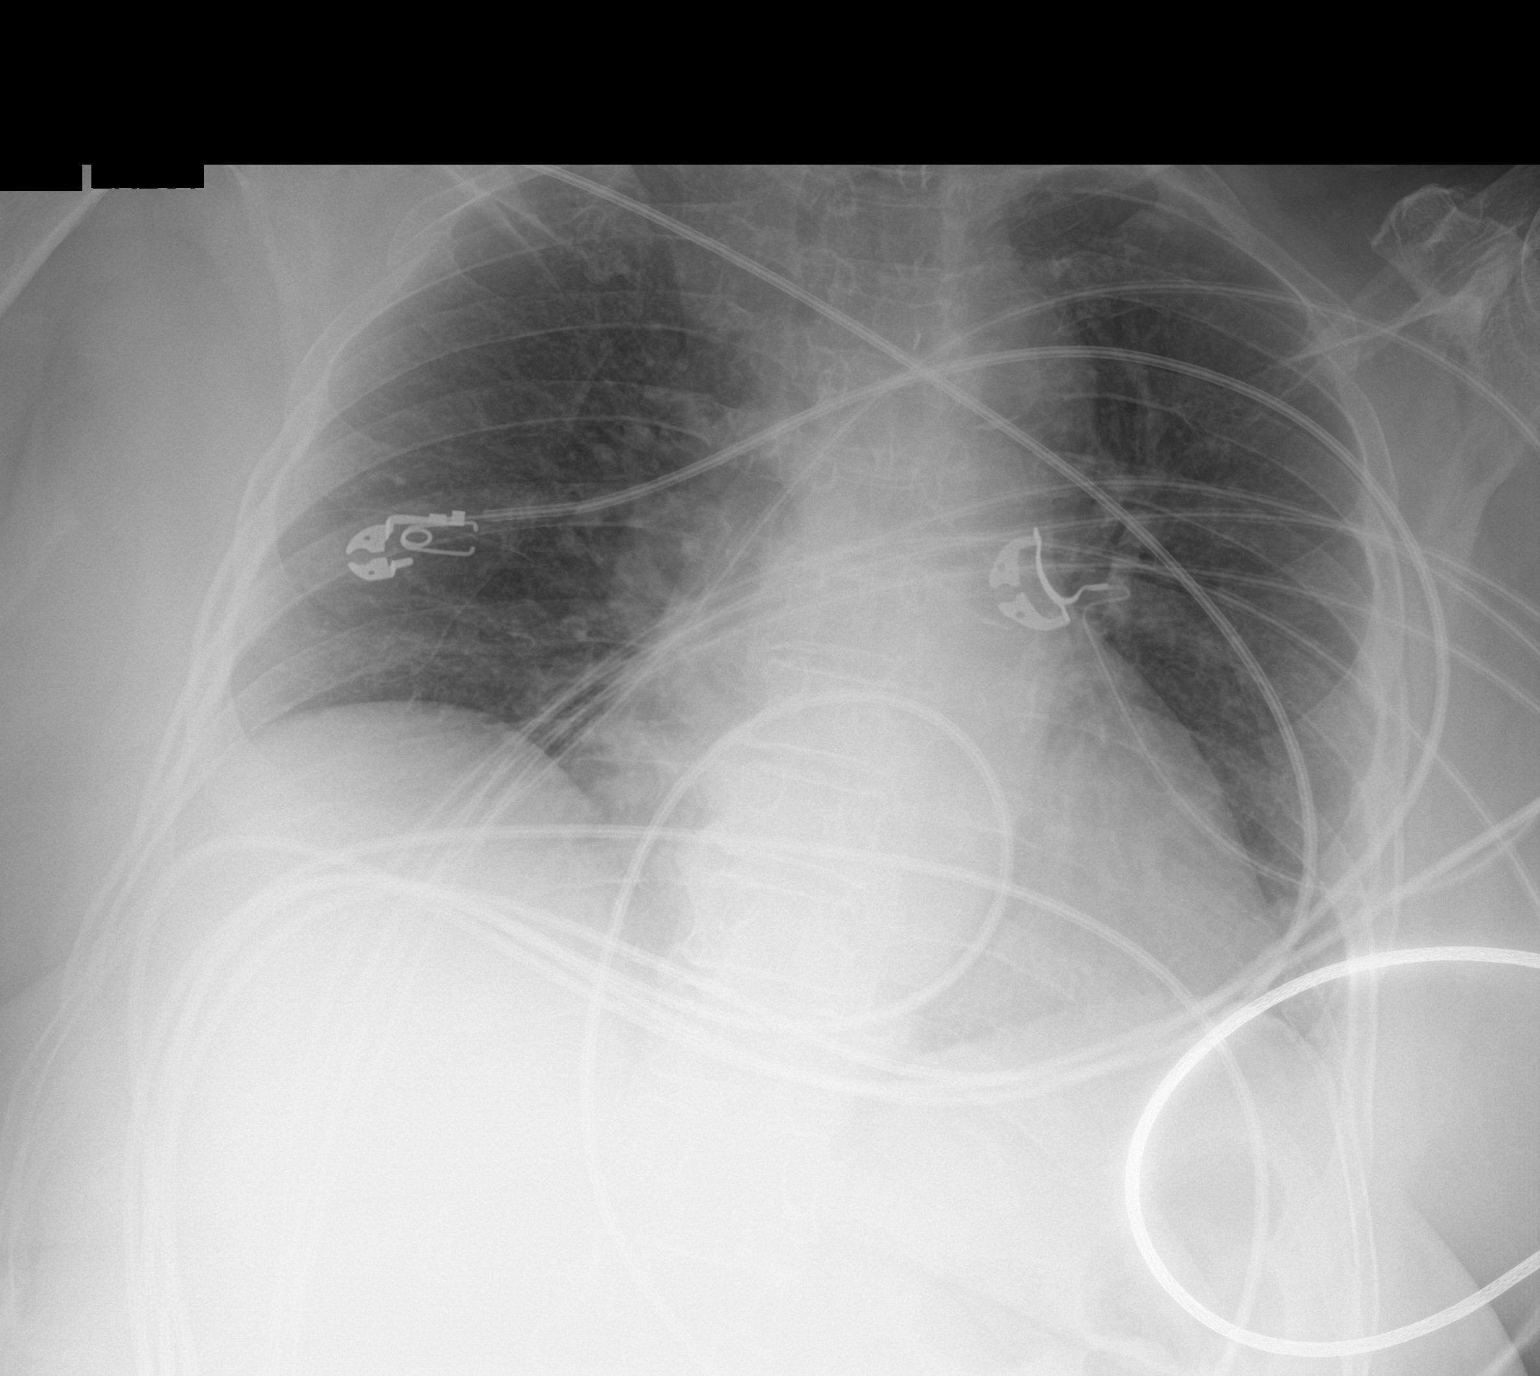

[1 of 1 positions shown; findings below may reference images not displayed]

FINDINGS: Low lung volumes with some hazy opacities atelectasis or pulmonary
edema given the presence of pulmonary vascular congestion and
fissural thickening. Prominence of the cardiomediastinal silhouette
may be accentuated by the portable technique and/or low volumes. The
aorta is calcified. The remaining cardiomediastinal contours are
unremarkable. No acute osseous or soft tissue abnormality.
Degenerative changes are present in the imaged spine and shoulders.
IMPRESSION: Low lung volumes with hazy opacities likely reflect atelectasis or
early edema given pulmonary vascular congestion. Underlying
infection difficult to exclude in the setting of the fever.

## 2020-11-30 MED ORDER — IOHEXOL 350 MG/ML SOLN
100.0000 mL | Freq: Once | INTRAVENOUS | Status: AC | PRN
  Administered 2020-11-30: 100 mL via INTRAVENOUS

## 2020-11-30 MED ORDER — PRAVASTATIN SODIUM 40 MG PO TABS
40.0000 mg | ORAL_TABLET | Freq: Every day | ORAL | Status: DC
Start: 2020-12-01 — End: 2020-12-06
  Administered 2020-12-01 – 2020-12-06 (×5): 40 mg via ORAL
  Filled 2020-11-30 (×3): qty 1
  Filled 2020-11-30: qty 2
  Filled 2020-11-30: qty 1

## 2020-11-30 MED ORDER — DULOXETINE HCL 60 MG PO CPEP
120.0000 mg | ORAL_CAPSULE | Freq: Every day | ORAL | Status: DC
  Administered 2020-12-01 – 2020-12-06 (×6): 120 mg via ORAL
  Filled 2020-11-30 (×5): qty 2
  Filled 2020-11-30: qty 4

## 2020-11-30 MED ORDER — SODIUM CHLORIDE 0.9 % IV BOLUS
500.0000 mL | Freq: Once | INTRAVENOUS | Status: AC
  Administered 2020-11-30: 500 mL via INTRAVENOUS

## 2020-11-30 MED ORDER — VANCOMYCIN HCL IN DEXTROSE 1-5 GM/200ML-% IV SOLN: 1000.0000 mg | Freq: Once | INTRAVENOUS | Status: DC

## 2020-11-30 MED ORDER — METRONIDAZOLE IN NACL 5-0.79 MG/ML-% IV SOLN
500.0000 mg | Freq: Three times a day (TID) | INTRAVENOUS | Status: DC
  Administered 2020-11-30 – 2020-12-01 (×2): 500 mg via INTRAVENOUS
  Filled 2020-11-30 (×2): qty 100

## 2020-11-30 MED ORDER — ACETAMINOPHEN 325 MG PO TABS
650.0000 mg | ORAL_TABLET | Freq: Four times a day (QID) | ORAL | Status: DC | PRN
  Administered 2020-12-01 – 2020-12-03 (×3): 650 mg via ORAL
  Filled 2020-11-30 (×3): qty 2

## 2020-11-30 MED ORDER — SODIUM CHLORIDE 0.9 % IV SOLN: INTRAVENOUS | Status: DC

## 2020-11-30 MED ORDER — POTASSIUM CHLORIDE CRYS ER 20 MEQ PO TBCR
40.0000 meq | EXTENDED_RELEASE_TABLET | Freq: Once | ORAL | Status: AC
  Administered 2020-11-30: 40 meq via ORAL
  Filled 2020-11-30: qty 2

## 2020-11-30 MED ORDER — SODIUM CHLORIDE 0.9 % IV SOLN
2.0000 g | INTRAVENOUS | Status: AC
  Administered 2020-11-30: 2 g via INTRAVENOUS
  Filled 2020-11-30: qty 2

## 2020-11-30 MED ORDER — ACETAMINOPHEN 650 MG RE SUPP: 650.0000 mg | Freq: Once | RECTAL | Status: DC

## 2020-11-30 MED ORDER — SODIUM CHLORIDE 0.9 % IV SOLN
2.0000 g | Freq: Three times a day (TID) | INTRAVENOUS | Status: DC
  Administered 2020-12-01 (×2): 2 g via INTRAVENOUS
  Filled 2020-11-30 (×2): qty 2

## 2020-11-30 MED ORDER — ACETAMINOPHEN 500 MG PO TABS
1000.0000 mg | ORAL_TABLET | Freq: Once | ORAL | Status: AC
  Administered 2020-11-30: 1000 mg via ORAL
  Filled 2020-11-30: qty 2

## 2020-11-30 MED ORDER — CYANOCOBALAMIN 250 MCG PO TABS
500.0000 ug | ORAL_TABLET | Freq: Every day | ORAL | Status: DC
  Administered 2020-12-01 – 2020-12-06 (×6): 500 ug via ORAL
  Filled 2020-11-30 (×5): qty 2
  Filled 2020-11-30: qty 1

## 2020-11-30 MED ORDER — VANCOMYCIN HCL 1750 MG/350ML IV SOLN
1750.0000 mg | INTRAVENOUS | Status: AC
  Administered 2020-11-30: 1750 mg via INTRAVENOUS
  Filled 2020-11-30: qty 350

## 2020-11-30 MED ORDER — SODIUM CHLORIDE 0.9 % IV BOLUS
1000.0000 mL | Freq: Once | INTRAVENOUS | Status: AC
  Administered 2020-11-30: 1000 mL via INTRAVENOUS

## 2020-11-30 MED ORDER — GABAPENTIN 300 MG PO CAPS
600.0000 mg | ORAL_CAPSULE | Freq: Two times a day (BID) | ORAL | Status: DC
  Administered 2020-11-30 – 2020-12-06 (×12): 600 mg via ORAL
  Filled 2020-11-30 (×12): qty 2

## 2020-11-30 MED ORDER — TRAZODONE HCL 100 MG PO TABS
100.0000 mg | ORAL_TABLET | Freq: Every day | ORAL | Status: DC
  Administered 2020-11-30 – 2020-12-05 (×6): 100 mg via ORAL
  Filled 2020-11-30 (×6): qty 1

## 2020-11-30 MED ORDER — ARIPIPRAZOLE 10 MG PO TABS
10.0000 mg | ORAL_TABLET | Freq: Every day | ORAL | Status: DC
Start: 2020-12-01 — End: 2020-12-06
  Administered 2020-12-01 – 2020-12-05 (×5): 10 mg via ORAL
  Filled 2020-11-30 (×6): qty 1

## 2020-11-30 MED ORDER — FOLIC ACID 1 MG PO TABS
500.0000 ug | ORAL_TABLET | Freq: Every day | ORAL | Status: DC
  Administered 2020-12-01 – 2020-12-05 (×5): 0.5 mg via ORAL
  Filled 2020-11-30 (×6): qty 1

## 2020-11-30 MED ORDER — POTASSIUM CHLORIDE CRYS ER 20 MEQ PO TBCR: 20.0000 meq | EXTENDED_RELEASE_TABLET | Freq: Two times a day (BID) | ORAL | 0 refills | Status: DC

## 2020-11-30 MED ORDER — LEVOTHYROXINE SODIUM 25 MCG PO TABS
25.0000 ug | ORAL_TABLET | Freq: Every day | ORAL | Status: DC
  Administered 2020-12-01 – 2020-12-06 (×5): 25 ug via ORAL
  Filled 2020-11-30 (×6): qty 1

## 2020-11-30 MED ORDER — SODIUM CHLORIDE 0.9 % IV BOLUS: 500.0000 mL | Freq: Once | INTRAVENOUS | Status: DC

## 2020-11-30 MED ORDER — ENOXAPARIN SODIUM 40 MG/0.4ML ~~LOC~~ SOLN
40.0000 mg | SUBCUTANEOUS | Status: DC
  Administered 2020-12-01 – 2020-12-05 (×5): 40 mg via SUBCUTANEOUS
  Filled 2020-11-30 (×5): qty 0.4

## 2020-11-30 MED ORDER — ACETAMINOPHEN 650 MG RE SUPP: 650.0000 mg | Freq: Four times a day (QID) | RECTAL | Status: DC | PRN

## 2020-11-30 NOTE — ED Triage Notes (Signed)
Patient BIB family. D/c today. Dx with hypokalemia. Febrile in triage. Continues to c/ back pain. Family states patient is altered.

## 2020-11-30 NOTE — Progress Notes (Signed)
A consult was received from an ED physician for cefepime and vancomycin  per pharmacy dosing.  The patient's profile has been reviewed for ht/wt/allergies/indication/available labs.   A one time order has been placed for cefepime 2 g and vancomycin 1750 mg.  Further antibiotics/pharmacy consults should be ordered by admitting physician if indicated.                       Thank you, Napoleon Form 11/30/2020  6:51 PM

## 2020-11-30 NOTE — ED Provider Notes (Signed)
[facility] DEPT Provider Note   CSN: [id number] Arrival date & time: 11/30/20  1803     History Chief Complaint  Patient presents with  . Fever  . Back Pain    [name] is a 75 y.o. female.  75 year old female with prior medical history as detailed below presents for evaluation.  Patient was seen previously at this facility earlier today.  Her complaint at that time was dysuria with reported temperature of 100.6.  Work-up was without clear evidence of UTI.  Patient apparently was improved.  She was discharged home.  She returns now by EMS.  Patient reports increased confusion and high fever.   She is now without specific complaint on arrival.  She complains "of not feeling well."  She appears mildly confused.  Temperature on arrival of 103.  The history is provided by the patient and medical records.  Fever Max temp prior to arrival:  103 Temp source:  Rectal Severity:  Moderate Onset quality:  Gradual Duration:  1 day Timing:  Constant Progression:  Worsening Chronicity:  New Relieved by:  Nothing Worsened by:  Nothing Back Pain Associated symptoms: fever        Past Medical History:  Diagnosis Date  . 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 Anxiety   . Depression   . Full dentures   . Hemorrhoids    external  . History of radiation therapy 11/29/11 to 01/05/12   anal canal 5040 cGy 28 sessions, regional lymph nodes 4200 cGy 28 sessions  . Hyperlipidemia   . Rectal bleeding   . Vitamin D deficiency   . Wears glasses     Patient Active Problem List   Diagnosis Date Noted  . Lumbar radiculopathy 10/10/2019  . Bilateral stenosis of lateral recess of lumbar spine 10/10/2019  . Hemorrhoids, external without complications 40/98/1191  . Depression   . Anxiety   . Rectal bleeding   . History of radiation therapy   . Malignant neoplasm of anal canal ([place]) 11/15/2011  . Hemorrhoids, internal, with bleeding  09/07/2011  . Acquired anal stenosis 09/07/2011  . Hyperlipidemia 09/07/2011  . Family history of breast cancer in sister 09/07/2011    Past Surgical History:  Procedure Laterality Date  . ABDOMINAL HYSTERECTOMY  1978   age 66  . EXAMINATION UNDER ANESTHESIA  06/21/2012   Procedure: EXAM UNDER ANESTHESIA;  Surgeon: [name], MD;  Location: [facility];  Service: General;  Laterality: N/A;  rectal exam under anesthesia, anal dilation, rectal biopsy  . [place]   Patient had to guess on the date.  [name] RECTAL BIOPSY  06/21/2012   Procedure: BIOPSY RECTAL;  Surgeon: [name], MD;  Location: [facility];  Service: General;  Laterality: N/A;  . TUBAL LIGATION     b/l  age 17     OB History    Gravida  1   Para  1   Term      Preterm      AB      Living        SAB      IAB      Ectopic      Multiple      Live Births              Family History  Problem Relation Age of Onset  . Cancer Sister        breast/  age 38  . Cervical cancer Sister        50  . Alcohol abuse Maternal Uncle     Social History   Tobacco Use  . Smoking status: Former Smoker    Packs/day: 0.50    Years: 25.00    Pack years: 12.50    Types: Cigarettes    Quit date: 09/07/1987    Years since quitting: 33.2  . Smokeless tobacco: Never Used  Vaping Use  . Vaping Use: Never used  Substance Use Topics  . Alcohol use: No  . Drug use: No    Home Medications Prior to Admission medications   Medication Sig Start Date End Date Taking? Authorizing Provider  acetaminophen (TYLENOL) 650 MG CR tablet Take 650 mg by mouth every 8 (eight) hours as needed for pain.    [provider]  ARIPiprazole (ABILIFY) 10 MG tablet Take 10 mg by mouth daily. 11/23/18   [provider]  Calcium Carb-Cholecalciferol (CALTRATE 600+D3 SOFT PO) Take 1 tablet by mouth daily.    [provider]  cholecalciferol (VITAMIN D) 1000  UNITS tablet Take 1,000 Units by mouth daily.    [provider]  cyanocobalamin 500 MCG tablet Take 500 mcg by mouth daily.    [provider]  diclofenac (VOLTAREN) 75 MG EC tablet TAKE 1 TABLET(75 MG) BY MOUTH TWICE DAILY WITH FOOD Patient taking differently: Take 75 mg by mouth 2 (two) times daily. 04/03/20   Magnus Sinning, MD  DULoxetine (CYMBALTA) 60 MG capsule Take 120 mg by mouth daily.     [provider]  folic acid (FOLVITE) [id number] MCG tablet Take 400 mcg by mouth daily.    [provider]  gabapentin (NEURONTIN) 600 MG tablet Take 600 mg by mouth 2 (two) times daily. 07/14/16   [provider]  Garlic [id number] MG CAPS Take 1,000 mg by mouth daily.    [provider]  levothyroxine (SYNTHROID, LEVOTHROID) 25 MCG tablet Take 25 mcg by mouth daily. 08/29/18   [provider]  Multiple Vitamin (MULTIVITAMIN) tablet Take 1 tablet by mouth daily.    [provider]  Multiple Vitamins-Minerals (CENTRUM SILVER ULTRA WOMENS PO) Take by mouth.    [provider]  naproxen sodium (ALEVE) 220 MG tablet Take 220 mg by mouth daily.    [provider]  Omega-3 Fatty Acids (FISH OIL) 1000 MG CAPS Take by mouth.    [provider]  polyethylene glycol (MIRALAX / GLYCOLAX) packet Take 17 g by mouth daily. Patient taking differently: Take 17 g by mouth as needed for mild constipation. 01/17/19   Khatri, Hina, PA-C  potassium chloride SA (KLOR-CON) 20 MEQ tablet Take 1 tablet (20 mEq total) by mouth 2 (two) times daily. 11/30/20   Petrucelli, Samantha R, PA-C  pravastatin (PRAVACHOL) 40 MG tablet Take 40 mg by mouth daily with breakfast.    [provider]  traMADol (ULTRAM) 50 MG tablet TAKE 1 TABLET(50 MG) BY MOUTH DAILY AS NEEDED Patient taking differently: Take 50 mg by mouth daily as needed for moderate pain. 08/25/20   Magnus Sinning, MD  traZODone (DESYREL) 100 MG tablet Take 100 mg by mouth at  bedtime.    [provider]  vitamin C (ASCORBIC ACID) 500 MG tablet Take 500 mg by mouth daily.    [provider]    Allergies    Patient has no known allergies.  Review of Systems   Review of Systems  Constitutional: Positive for  fever.  Musculoskeletal: Positive for back pain.  All other systems reviewed and are negative.   Physical Exam Updated Vital Signs BP (!) 121/50   Pulse 95   Temp 100 F (37.8 C) (Oral)   Resp (!) 24   SpO2 98%   Physical Exam Vitals and nursing note reviewed.  Constitutional:      General: She is not in acute distress.    Appearance: She is well-developed and well-nourished.  HENT:     Head: Normocephalic and atraumatic.     Mouth/Throat:     Mouth: Oropharynx is clear and moist. Mucous membranes are moist.  Eyes:     Extraocular Movements: EOM normal.     Conjunctiva/sclera: Conjunctivae normal.     Pupils: Pupils are equal, round, and reactive to light.  Cardiovascular:     Rate and Rhythm: Regular rhythm. Tachycardia present.     Heart sounds: Normal heart sounds.  Pulmonary:     Effort: Pulmonary effort is normal. No respiratory distress.     Breath sounds: Normal breath sounds.  Abdominal:     General: There is no distension.     Palpations: Abdomen is soft.     Tenderness: There is no abdominal tenderness.  Musculoskeletal:        General: No deformity or edema. Normal range of motion.     Cervical back: Normal range of motion and neck supple.  Skin:    General: Skin is warm and dry.  Neurological:     General: No focal deficit present.     Mental Status: She is alert.     Comments: Mildly disoriented  No focal deficit  Psychiatric:        Mood and Affect: Mood and affect normal.     ED Results / Procedures / Treatments   Labs (all labs ordered are listed, but only abnormal results are displayed) Labs Reviewed  BASIC METABOLIC PANEL - Abnormal; Notable for the following components:      Result  Value   Potassium 3.3 (*)    Glucose, Bld 107 (*)    All other components within normal limits  CBC WITH DIFFERENTIAL/PLATELET - Abnormal; Notable for the following components:   WBC 10.6 (*)    RBC 3.83 (*)    Neutro Abs 9.8 (*)    Lymphs Abs 0.3 (*)    Abs Immature Granulocytes 0.08 (*)    All other components within normal limits  LACTIC ACID, PLASMA - Abnormal; Notable for the following components:   Lactic Acid, Venous 3.6 (*)    All other components within normal limits  TROPONIN I (HIGH SENSITIVITY) - Abnormal; Notable for the following components:   Troponin I (High Sensitivity) 21 (*)    All other components within normal limits  CULTURE, BLOOD (ROUTINE X 2)  SARS CORONAVIRUS 2 BY RT PCR (HOSPITAL ORDER, [facility])  CULTURE, BLOOD (ROUTINE X 2) W REFLEX TO ID PANEL  LACTIC ACID, PLASMA  PROTIME-INR  TROPONIN I (HIGH SENSITIVITY)    EKG EKG Interpretation  Date/Time:  Sunday November 30 2020 19:26:47 EST Ventricular Rate:  103 PR Interval:    QRS Duration: 58 QT Interval:  368 QTC Calculation: 482 R Axis:   -35 Text Interpretation: Sinus tachycardia Inferior infarct, old Confirmed by Dene Gentry [phone]) on 11/30/2020 7:40:18 PM   Radiology DG Chest Port 1 View  Result Date: 11/30/2020 CLINICAL DATA:  Fever, hypokalemia, altered mental status EXAM: PORTABLE CHEST 1 VIEW COMPARISON:  CT 02/09/2012, radiograph 02/09/2012 FINDINGS: Low lung volumes with some hazy opacities atelectasis or pulmonary edema given the presence of pulmonary vascular congestion and fissural thickening. Prominence of the cardiomediastinal silhouette may be accentuated by the portable technique and/or low volumes. The aorta is calcified. The remaining cardiomediastinal contours are unremarkable. No acute osseous or soft tissue abnormality. Degenerative changes are present in the imaged spine and shoulders. IMPRESSION: Low lung volumes with hazy opacities likely reflect  atelectasis or early edema given pulmonary vascular congestion. Underlying infection difficult to exclude in the setting of the fever. Electronically Signed   By: Kreg ShropshirePrice  DeHay M.D.   On: 11/30/2020 19:22   CT Renal Stone Study  Result Date: 11/30/2020 CLINICAL DATA:  Flank pain EXAM: CT ABDOMEN AND PELVIS WITHOUT CONTRAST TECHNIQUE: Multidetector CT imaging of the abdomen and pelvis was performed following the standard protocol without IV contrast. COMPARISON:  CT abdomen dated 01/17/2019 FINDINGS: Lower chest: No acute abnormality. Hepatobiliary: No focal liver abnormality is seen. Gallbladder is unremarkable. No bile duct dilatation. Pancreas: Unremarkable. No pancreatic ductal dilatation or surrounding inflammatory changes. Spleen: Normal in size without focal abnormality. Adrenals/Urinary Tract: Adrenal glands are unremarkable. 4 mm RIGHT renal stone. Kidneys otherwise unremarkable without hydronephrosis or perinephric inflammation. No ureteral or bladder calculi. Bladder is unremarkable. Stomach/Bowel: No dilated large or small bowel loops. No evidence of bowel wall inflammation. Appendix is not seen but there are no inflammatory changes about the cecum to suggest acute appendicitis. Stomach is unremarkable, partially decompressed. Vascular/Lymphatic: Aortic atherosclerosis. No enlarged lymph nodes are seen within the abdomen or pelvis. Reproductive: Presumed hysterectomy.  No adnexal mass or free fluid. Other: No free fluid or abscess collection. No free intraperitoneal air. Musculoskeletal: No acute-appearing osseous abnormality. Stable chronic compression fracture deformities of the L1, L3 and L5 vertebral bodies. IMPRESSION: 1. No acute findings within the abdomen or pelvis. No bowel obstruction or evidence of bowel wall inflammation. No evidence of acute solid organ abnormality. No free fluid or inflammatory change. 2. 4 mm nonobstructing RIGHT renal stone. No ureteral or bladder calculi. 3. Chronic  compression fracture deformities of the L1, L3 and L5 vertebral bodies, not significantly changed compared to earlier CT of 01/17/2019. Aortic Atherosclerosis (ICD10-I70.0). Electronically Signed   By: Bary RichardStan  Maynard M.D.   On: 11/30/2020 12:42    Procedures Procedures  CRITICAL CARE Performed by: Wynetta FinesPeter C Ted Leonhart   Total critical care time: 30 minutes  Critical care time was exclusive of separately billable procedures and treating other patients.  Critical care was necessary to treat or prevent imminent or life-threatening deterioration.  Critical care was time spent personally by me on the following activities: development of treatment plan with patient and/or surrogate as well as nursing, discussions with consultants, evaluation of patient's response to treatment, examination of patient, obtaining history from patient or surrogate, ordering and performing treatments and interventions, ordering and review of laboratory studies, ordering and review of radiographic studies, pulse oximetry and re-evaluation of patient's condition.   Medications Ordered in ED Medications  vancomycin (VANCOREADY) IVPB 1750 mg/350 mL (1,750 mg Intravenous New Bag/Given 11/30/20 2024)  sodium chloride 0.9 % bolus 500 mL (0 mLs Intravenous Stopped 11/30/20 2031)  acetaminophen (TYLENOL) tablet 1,000 mg (1,000 mg Oral Given 11/30/20 1900)  ceFEPIme (MAXIPIME) 2 g in sodium chloride 0.9 % 100 mL IVPB (0 g Intravenous Stopped 11/30/20 2023)  sodium chloride 0.9 % bolus 1,000 mL (1,000 mLs Intravenous New Bag/Given 11/30/20 2030)  iohexol (OMNIPAQUE) 350 MG/ML injection 100 mL (100  mLs Intravenous Contrast Given 11/30/20 2118)    ED Course  I have reviewed the triage vital signs and the nursing notes.  Pertinent labs & imaging results that were available during my care of the patient were reviewed by me and considered in my medical decision making (see chart for details).    MDM Rules/Calculators/A&P                            MDM  Screen complete  [name] was evaluated in Emergency Department on 11/30/2020 for the symptoms described in the history of present illness. She was evaluated in the context of the global COVID-19 pandemic, which necessitated consideration that the patient might be at risk for infection with the SARS-CoV-2 virus that causes COVID-19. Institutional protocols and algorithms that pertain to the evaluation of patients at risk for COVID-19 are in a state of rapid change based on information released by regulatory bodies including the CDC and federal and state organizations. These policies and algorithms were followed during the patient's care in the ED.  Patient was seen previously this morning for reported dysuria and low-grade temp.  Work-up at that time was without clear evidence of UTI.    Patient was discharged home  Returns this evening with significant fever to 103.  Patient with signs of early sepsis.  Patient's extremities are cool and mottled on initial evaluation at time of return.  Code Sepsis initiated.  After antipyretics, IV fluids, and antibiotics patient feels improved.  At time of admission, patient is comfortable, alert.   Covid swab is pending at time of admission. Suspect possible covid infection.   No clear evidence of significant pneumonia, intra-abdominal process, or UTI.  Hospitalist  Service Jonelle Sidle) is aware of case.  They will evaluate for admission.    Final Clinical Impression(s) / ED Diagnoses Final diagnoses:  Fever, unspecified fever cause    Rx / DC Orders ED Discharge Orders    None       Valarie Merino, MD 11/30/20 2222

## 2020-11-30 NOTE — Discharge Instructions (Addendum)
You are seen in the emergency department today for trouble with urination.  Your urine did not appear significantly infected, we have sent this for culture.  Your potassium was low, we are sending you with a potassium supplement to take for the next few days.  We have also provided diet guidelines.  Please follow-up with your primary care provider within 3 days for reevaluation of your symptoms.  Return to the ER for new or worsening symptoms including but not limited to fever, inability to keep fluids down, abdominal pain, chest pain, trouble breathing, passing out, feeling like your heart is racing, or any other concerns.

## 2020-11-30 NOTE — Progress Notes (Signed)
Code sepsis, [facility] following.

## 2020-11-30 NOTE — ED Provider Notes (Signed)
[facility] DEPT Provider Note   CSN: [id number] Arrival date & time: 11/30/20  0932     History Chief Complaint  Patient presents with  . Dysuria  . Painful Urination    [name] is a 75 y.o. female with a hx of hyperlipidemia who presents to the ED via EMS with complaints of dysuria since yesterday. Having dysuria with urgency. Other than urination no alleviating/aggravating factors. Also having some right lower back pain intermittently, worse with movement, hx of similar in the past, no recent injuries/trauma/activity change. Denies N/V/D, constipation, hematuria, abdominal pain, chest pain, dyspnea or syncope. EMS reported temp 100.6- gave 1g tylenol en route- patient denies fever/chills PTA, states she does feel a bit hot since arrival to the ED. Denies numbness, tingling, weakness, or incontinence.   HPI     Past Medical History:  Diagnosis Date  . 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 Anxiety   . Depression   . Full dentures   . Hemorrhoids    external  . History of radiation therapy 11/29/11 to 01/05/12   anal canal 5040 cGy 28 sessions, regional lymph nodes 4200 cGy 28 sessions  . Hyperlipidemia   . Rectal bleeding   . Vitamin D deficiency   . Wears glasses     Patient Active Problem List   Diagnosis Date Noted  . Lumbar radiculopathy 10/10/2019  . Bilateral stenosis of lateral recess of lumbar spine 10/10/2019  . Hemorrhoids, external without complications 0000000  . Depression   . Anxiety   . Rectal bleeding   . History of radiation therapy   . Malignant neoplasm of anal canal ([place]) 11/15/2011  . Hemorrhoids, internal, with bleeding 09/07/2011  . Acquired anal stenosis 09/07/2011  . Hyperlipidemia 09/07/2011  . Family history of breast cancer in sister 09/07/2011    Past Surgical History:  Procedure Laterality Date  . ABDOMINAL HYSTERECTOMY  1978   age 28  . EXAMINATION UNDER ANESTHESIA  06/21/2012    Procedure: EXAM UNDER ANESTHESIA;  Surgeon: [name], MD;  Location: [facility];  Service: General;  Laterality: N/A;  rectal exam under anesthesia, anal dilation, rectal biopsy  . [place]   Patient had to guess on the date.  [name] RECTAL BIOPSY  06/21/2012   Procedure: BIOPSY RECTAL;  Surgeon: [name], MD;  Location: [facility];  Service: General;  Laterality: N/A;  . TUBAL LIGATION     b/l  age 39     OB History    Gravida  1   Para  1   Term      Preterm      AB      Living        SAB      IAB      Ectopic      Multiple      Live Births              Family History  Problem Relation Age of Onset  . Cancer Sister        breast/ age 46  . Cervical cancer Sister        47  . Alcohol abuse Maternal Uncle     Social History   Tobacco Use  . Smoking status: Former Smoker    Packs/day: 0.50    Years: 25.00    Pack years: 12.50    Types: Cigarettes    Quit  date: 09/07/1987    Years since quitting: 33.2  . Smokeless tobacco: Never Used  Vaping Use  . Vaping Use: Never used  Substance Use Topics  . Alcohol use: No  . Drug use: No    Home Medications Prior to Admission medications   Medication Sig Start Date End Date Taking? Authorizing Provider  ARIPiprazole (ABILIFY) 10 MG tablet Take 10 mg by mouth daily. 11/23/18   [provider]  aspirin EC 81 MG tablet Take 81 mg by mouth daily.    [provider]  bisacodyl (DULCOLAX) 10 MG suppository Place 1 suppository (10 mg total) rectally as needed for moderate constipation. 01/17/19   Khatri, Hina, PA-C  Calcium Carb-Cholecalciferol (CALTRATE 600+D3 SOFT PO) Take by mouth.    [provider]  calcium carbonate (TUMS - DOSED IN MG ELEMENTAL CALCIUM) 500 MG chewable tablet Chew 1 tablet by mouth 2 (two) times daily.    [provider]  cholecalciferol (VITAMIN D) 1000 UNITS tablet Take 1,000 Units by mouth daily.     [provider]  cyanocobalamin 500 MCG tablet Take 500 mcg by mouth daily.    [provider]  diclofenac (VOLTAREN) 75 MG EC tablet TAKE 1 TABLET(75 MG) BY MOUTH TWICE DAILY WITH FOOD 04/03/20   [name], [name], MD  docusate sodium (COLACE) 100 MG capsule Take 1 capsule (100 mg total) by mouth every 12 (twelve) hours. 01/17/19   Khatri, Hina, PA-C  DULoxetine (CYMBALTA) 60 MG capsule Take 120 mg by mouth daily.     [provider]  folic acid (FOLVITE) 400 MCG tablet Take 400 mcg by mouth daily.    [provider]  gabapentin (NEURONTIN) 600 MG tablet Take 600 mg by mouth 2 (two) times daily. 07/14/16   [provider]  Garlic 1000 MG CAPS Take by mouth daily.    [provider]  levothyroxine (SYNTHROID, LEVOTHROID) 25 MCG tablet Take 25 mcg by mouth daily. 08/29/18   [provider]  Multiple Vitamins-Minerals (CENTRUM SILVER ULTRA WOMENS PO) Take by mouth.    [provider]  Omega-3 Fatty Acids (FISH OIL) 1000 MG CAPS Take by mouth.    [provider]  polyethylene glycol (MIRALAX / GLYCOLAX) packet Take 17 g by mouth daily. 01/17/19   Khatri, Hina, PA-C  pravastatin (PRAVACHOL) 40 MG tablet Take 40 mg by mouth daily with breakfast.    [provider]  traMADol (ULTRAM) 50 MG tablet TAKE 1 TABLET(50 MG) BY MOUTH DAILY AS NEEDED 08/25/20   [name], [name], MD  vitamin C (ASCORBIC ACID) 500 MG tablet Take 500 mg by mouth daily.    [provider]    Allergies    Patient has no known allergies.  Review of Systems   Review of Systems  Constitutional: Negative for chills and fever.  Respiratory: Negative for cough and shortness of breath.   Cardiovascular: Negative for chest pain.  Gastrointestinal: Negative for abdominal pain, blood in stool, constipation, diarrhea, nausea and vomiting.  Genitourinary: Positive for dysuria and urgency. Negative for vaginal bleeding and vaginal discharge.   Neurological: Negative for syncope, weakness and numbness.       Negative for incontinence or saddle anesthesia.   All other systems reviewed and are negative.   Physical Exam Updated Vital Signs BP 119/70   Pulse 92   Temp 98.4 F (36.9 C) (Oral)   Resp 18   SpO2 97%   Physical Exam Vitals and nursing note reviewed.  Constitutional:  General: She is not in acute distress.    Appearance: She is well-developed. She is not toxic-appearing.  HENT:     Head: Normocephalic and atraumatic.  Eyes:     General:        Right eye: No discharge.        Left eye: No discharge.     Conjunctiva/sclera: Conjunctivae normal.  Cardiovascular:     Rate and Rhythm: Normal rate and regular rhythm.  Pulmonary:     Effort: Pulmonary effort is normal. No respiratory distress.     Breath sounds: Normal breath sounds. No wheezing, rhonchi or rales.  Abdominal:     General: There is no distension.     Palpations: Abdomen is soft.     Tenderness: There is no abdominal tenderness.  Musculoskeletal:     Cervical back: Neck supple.     Comments: Back: No midline tenderness. R upper lumbar paraspinal muscle tenderness to palpation- no overlying rashes.   Skin:    General: Skin is warm and dry.     Findings: No rash.  Neurological:     Mental Status: She is alert.     Comments: Clear speech. Sensation grossly intact to bilateral lower extremities. 5/5 strength with ankle plantar/dorsiflexion bilaterally.   Psychiatric:        Behavior: Behavior normal.     ED Results / Procedures / Treatments   Labs (all labs ordered are listed, but only abnormal results are displayed) Labs Reviewed  COMPREHENSIVE METABOLIC PANEL - Abnormal; Notable for the following components:      Result Value   Sodium 134 (*)    Potassium 2.8 (*)    CO2 21 (*)    Glucose, Bld 149 (*)    Calcium 8.7 (*)    All other components within normal limits  CBC WITH DIFFERENTIAL/PLATELET - Abnormal; Notable for the  following components:   WBC 12.3 (*)    RBC 3.20 (*)    Hemoglobin 10.5 (*)    HCT 31.3 (*)    Neutro Abs 10.9 (*)    Lymphs Abs 0.4 (*)    Abs Immature Granulocytes 0.10 (*)    All other components within normal limits  URINALYSIS, ROUTINE W REFLEX MICROSCOPIC - Abnormal; Notable for the following components:   Ketones, ur 5 (*)    Leukocytes,Ua TRACE (*)    All other components within normal limits  URINE CULTURE  MAGNESIUM    EKG EKG Interpretation  Date/Time:  Sunday November 30 2020 11:21:22 EST Ventricular Rate:  93 PR Interval:    QRS Duration: 85 QT Interval:  437 QTC Calculation: 544 R Axis:   -29 Text Interpretation: Sinus rhythm Borderline left axis deviation Low voltage, precordial leads Nonspecific T abnormalities, diffuse leads , new since last tracing Prolonged QT interval Confirmed by Dorie Rank [phone]) on 11/30/2020 2:20:19 PM   Radiology CT Renal Stone Study  Result Date: 11/30/2020 CLINICAL DATA:  Flank pain EXAM: CT ABDOMEN AND PELVIS WITHOUT CONTRAST TECHNIQUE: Multidetector CT imaging of the abdomen and pelvis was performed following the standard protocol without IV contrast. COMPARISON:  CT abdomen dated 01/17/2019 FINDINGS: Lower chest: No acute abnormality. Hepatobiliary: No focal liver abnormality is seen. Gallbladder is unremarkable. No bile duct dilatation. Pancreas: Unremarkable. No pancreatic ductal dilatation or surrounding inflammatory changes. Spleen: Normal in size without focal abnormality. Adrenals/Urinary Tract: Adrenal glands are unremarkable. 4 mm RIGHT renal stone. Kidneys otherwise unremarkable without hydronephrosis or perinephric inflammation. No ureteral or bladder calculi. Bladder is unremarkable.  Stomach/Bowel: No dilated large or small bowel loops. No evidence of bowel wall inflammation. Appendix is not seen but there are no inflammatory changes about the cecum to suggest acute appendicitis. Stomach is unremarkable, partially decompressed.  Vascular/Lymphatic: Aortic atherosclerosis. No enlarged lymph nodes are seen within the abdomen or pelvis. Reproductive: Presumed hysterectomy.  No adnexal mass or free fluid. Other: No free fluid or abscess collection. No free intraperitoneal air. Musculoskeletal: No acute-appearing osseous abnormality. Stable chronic compression fracture deformities of the L1, L3 and L5 vertebral bodies. IMPRESSION: 1. No acute findings within the abdomen or pelvis. No bowel obstruction or evidence of bowel wall inflammation. No evidence of acute solid organ abnormality. No free fluid or inflammatory change. 2. 4 mm nonobstructing RIGHT renal stone. No ureteral or bladder calculi. 3. Chronic compression fracture deformities of the L1, L3 and L5 vertebral bodies, not significantly changed compared to earlier CT of 01/17/2019. Aortic Atherosclerosis (ICD10-I70.0). Electronically Signed   By: Franki Cabot M.D.   On: 11/30/2020 12:42    Procedures Procedures   Medications Ordered in ED Medications  sodium chloride 0.9 % bolus 500 mL (0 mLs Intravenous Stopped 11/30/20 1229)  potassium chloride SA (KLOR-CON) CR tablet 40 mEq (40 mEq Oral Given 11/30/20 1229)    ED Course  I have reviewed the triage vital signs and the nursing notes.  Pertinent labs & imaging results that were available during my care of the patient were reviewed by me and considered in my medical decision making (see chart for details).    MDM Rules/Calculators/A&P                         Patient presents to the ED with complaints of urinary sxs and mentions of chronic intermittent back pain. She is nontoxic, resting comfortably, vitals without significant abnormality.   Additional history obtained:  Additional history obtained from chart review & nursing note review.   Lab Tests:  I Ordered, reviewed, and interpreted labs, which included:  CBC: Mild leukocytosis, anemia similar to prior ranges.  CMP: Hypokalemia @ 2.8. additional mild  hypocalcemia, hyponatremia, & mildly low bicarb.  UA: No significant infection- trace leucocytes, no bacteria, sent for culture.  Mg: WNL  EKG: Sinus rhythm Borderline left axis deviation Low voltage, precordial leads Nonspecific T abnormalities, diffuse leads , new since last tracing Prolonged QT interval Imaging Studies ordered:  I ordered imaging studies which included CT renal stone study I independently visualized and interpreted imaging which showed 1. No acute findings within the abdomen or pelvis. No bowel obstruction or evidence of bowel wall inflammation. No evidence of acute solid organ abnormality. No free fluid or inflammatory change. 2. 4 mm nonobstructing RIGHT renal stone. No ureteral or bladder calculi. 3. Chronic compression fracture deformities of the L1, L3 and L5 vertebral bodies, not significantly changed compared to earlier CT of 01/17/2019. Aortic Atherosclerosis  ED Course:  Overall reassuring work up in the ED, no acute process on CT imaging, no obvious UTI- culture pending. Back pain chronic- lumbar paraspinal muscle tenderness, no recent injuries, no neuro deficits. Labs with some mild hypokalemia with some QTc prolongation, given oral potassium in the ED and will discharge home on supplementation per discussion w/ Dr. Tomi Bamberger. Patient requesting discharge, she would like to go home, feel this is reasonable at this time.   I discussed results, treatment plan, need for follow-up, and return precautions with the patient. Provided opportunity for questions, patient confirmed understanding and is in  agreement with plan.   Findings and plan of care discussed with supervising physician Dr. Tomi Bamberger who evaluated patient as shared visit & is in agreement.   Portions of this note were generated with [employer]. Dictation errors may occur despite best attempts at proofreading.  Final Clinical Impression(s) / ED Diagnoses Final diagnoses:  Hypokalemia    Rx / DC  Orders ED Discharge Orders         Ordered    potassium chloride SA (KLOR-CON) 20 MEQ tablet  2 times daily        11/30/20 1500           Amaryllis Dyke, PA-C 11/30/20 1503    Dorie Rank, MD 12/01/20 [phone]

## 2020-11-30 NOTE — H&P (Signed)
History and Physical    [name] DTO:[id number] DOB: 1946-04-10 DOA: 11/30/2020  PCP: Tiffany Stains, MD  Patient coming from: Home.  Chief Complaint: Fever and confusion.  HPI: [name] is a 75 y.o. female with history of hypothyroidism and depression has been experiencing cough nonproductive and not feeling well for the last 2 weeks.  Patient had come to the ER yesterday earlier with complaints of dysuria at the time CT renal study showed nothing acute.  Patient was discharged home.  Later in the evening patient's family found patient confused and febrile was brought into the ER again p.o. complaint of low back pain.  ED Course: In the ER patient was febrile with temperature 103 F tachycardic with elevated lactic acid levels features concerning for sepsis.  CT angiogram of the chest abdomen pelvis did not show anything acute.  CT renal study done during earlier visit showed nonobstructing stones and did show chronic compression fractures of the lumbar spine change from previous.  And CT head was unremarkable.  Covid test was negative.  Blood cultures were drawn and started on sepsis protocol antibiotics were started lactic acid improved and at the time of my exam patient is alert awake and oriented states he is feels much better but patient is being admitted for further management observation given the septic picture on presentation.  Patient's lactic acid improved from 3.61.6 procalcitonin is 1.15.  Review of Systems: As per HPI, rest all negative.   Past Medical History:  Diagnosis Date  . 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 Anxiety   . Depression   . Full dentures   . Hemorrhoids    external  . History of radiation therapy 11/29/11 to 01/05/12   anal canal 5040 cGy 28 sessions, regional lymph nodes 4200 cGy 28 sessions  . Hyperlipidemia   . Rectal bleeding   . Vitamin D deficiency   . Wears glasses     Past Surgical History:  Procedure Laterality Date   . ABDOMINAL HYSTERECTOMY  1978   age 57  . EXAMINATION UNDER ANESTHESIA  06/21/2012   Procedure: EXAM UNDER ANESTHESIA;  Surgeon: [name], MD;  Location: [facility];  Service: General;  Laterality: N/A;  rectal exam under anesthesia, anal dilation, rectal biopsy  . [place]   Patient had to guess on the date.  [name] RECTAL BIOPSY  06/21/2012   Procedure: BIOPSY RECTAL;  Surgeon: [name], MD;  Location: [facility];  Service: General;  Laterality: N/A;  . TUBAL LIGATION     b/l  age 35     reports that she quit smoking about 33 years ago. Her smoking use included cigarettes. She has a 12.50 pack-year smoking history. She has never used smokeless tobacco. She reports that she does not drink alcohol and does not use drugs.  No Known Allergies  Family History  Problem Relation Age of Onset  . Cancer Sister        breast/ age 85  . Cervical cancer Sister        24  . Alcohol abuse Maternal Uncle     Prior to Admission medications   Medication Sig Start Date End Date Taking? Authorizing Provider  acetaminophen (TYLENOL) 650 MG CR tablet Take 650 mg by mouth every 8 (eight) hours as needed for pain.    [provider]  ARIPiprazole (ABILIFY) 10 MG tablet Take 10 mg by mouth daily.  11/23/18   [provider]  Calcium Carb-Cholecalciferol (CALTRATE 600+D3 SOFT PO) Take 1 tablet by mouth daily.    [provider]  cholecalciferol (VITAMIN D) 1000 UNITS tablet Take 1,000 Units by mouth daily.    [provider]  cyanocobalamin 500 MCG tablet Take 500 mcg by mouth daily.    [provider]  diclofenac (VOLTAREN) 75 MG EC tablet TAKE 1 TABLET(75 MG) BY MOUTH TWICE DAILY WITH FOOD Patient taking differently: Take 75 mg by mouth 2 (two) times daily. 04/03/20   Tyrell [name], [name], MD  DULoxetine (CYMBALTA) 60 MG capsule Take 120 mg by mouth daily.     [provider]  folic acid (FOLVITE)  400 MCG tablet Take 400 mcg by mouth daily.    [provider]  gabapentin (NEURONTIN) 600 MG tablet Take 600 mg by mouth 2 (two) times daily. 07/14/16   [provider]  Garlic 1000 MG CAPS Take 1,000 mg by mouth daily.    [provider]  levothyroxine (SYNTHROID, LEVOTHROID) 25 MCG tablet Take 25 mcg by mouth daily. 08/29/18   [provider]  Multiple Vitamin (MULTIVITAMIN) tablet Take 1 tablet by mouth daily.    [provider]  Multiple Vitamins-Minerals (CENTRUM SILVER ULTRA WOMENS PO) Take by mouth.    [provider]  naproxen sodium (ALEVE) 220 MG tablet Take 220 mg by mouth daily.    [provider]  Omega-3 Fatty Acids (FISH OIL) 1000 MG CAPS Take by mouth.    [provider]  polyethylene glycol (MIRALAX / GLYCOLAX) packet Take 17 g by mouth daily. Patient taking differently: Take 17 g by mouth as needed for mild constipation. 01/17/19   [name], Hina, PA-C  potassium chloride SA (KLOR-CON) 20 MEQ tablet Take 1 tablet (20 mEq total) by mouth 2 (two) times daily. 11/30/20   [name], Tiffany R, PA-C  pravastatin (PRAVACHOL) 40 MG tablet Take 40 mg by mouth daily with breakfast.    [provider]  traMADol (ULTRAM) 50 MG tablet TAKE 1 TABLET(50 MG) BY MOUTH DAILY AS NEEDED Patient taking differently: Take 50 mg by mouth daily as needed for moderate pain. 08/25/20   Tyrell [name], [name], MD  traZODone (DESYREL) 100 MG tablet Take 100 mg by mouth at bedtime.    [provider]  vitamin C (ASCORBIC ACID) 500 MG tablet Take 500 mg by mouth daily.    [provider]    Physical Exam: Constitutional: Moderately built and nourished. Vitals:   11/30/20 2015 11/30/20 2025 11/30/20 2100 11/30/20 2200  BP: (!) 125/57  130/66 (!) 121/50  Pulse: 99  100 95  Resp: 18  20 (!) 24  Temp:  100 F (37.8 C)    TempSrc:  Oral    SpO2: 100%  97% 98%   Eyes: Anicteric no pallor. ENMT: No discharge from  the ears eyes nose or mouth. Neck: No neck rigidity no mass felt. Respiratory: No rhonchi or crepitations. Cardiovascular: S1-S2 heard. Abdomen: Soft nontender bowel sounds present. Musculoskeletal: No edema. Skin: No rash. Neurologic: Alert awake oriented time place and person.  Moves all extremities. Psychiatric: Appears normal.  Normal affect.   Labs on Admission: I have personally reviewed following labs and imaging studies  CBC: Recent Labs  Lab 11/30/20 0951 11/30/20 1925  WBC 12.3* 10.6*  NEUTROABS 10.9* 9.8*  HGB 10.5* 12.6  HCT 31.3* 38.2  MCV 97.8 99.7  PLT 171 188   Basic Metabolic Panel: Recent Labs  Lab 11/30/20 0951  11/30/20 1925  NA 134* 139  K 2.8* 3.3*  CL 100 102  CO2 21* 22  GLUCOSE 149* 107*  BUN 14 14  CREATININE 0.73 0.71  CALCIUM 8.7* 9.5  MG 1.7  --    GFR: Estimated Creatinine Clearance: 62.9 mL/min (by C-G formula based on SCr of 0.71 mg/dL). Liver Function Tests: Recent Labs  Lab 11/30/20 0951  AST 35  ALT 22  ALKPHOS 55  BILITOT 0.9  PROT 6.5  ALBUMIN 3.7   No results for input(s): LIPASE, AMYLASE in the last 168 hours. No results for input(s): AMMONIA in the last 168 hours. Coagulation Profile: Recent Labs  Lab 11/30/20 1925  INR 1.1   Cardiac Enzymes: No results for input(s): CKTOTAL, CKMB, CKMBINDEX, TROPONINI in the last 168 hours. BNP (last 3 results) No results for input(s): PROBNP in the last 8760 hours. HbA1C: No results for input(s): HGBA1C in the last 72 hours. CBG: No results for input(s): GLUCAP in the last 168 hours. Lipid Profile: No results for input(s): CHOL, HDL, LDLCALC, TRIG, CHOLHDL, LDLDIRECT in the last 72 hours. Thyroid Function Tests: No results for input(s): TSH, T4TOTAL, FREET4, T3FREE, THYROIDAB in the last 72 hours. Anemia Panel: No results for input(s): VITAMINB12, FOLATE, FERRITIN, TIBC, IRON, RETICCTPCT in the last 72 hours. Urine analysis:    Component Value Date/Time   COLORURINE  YELLOW 11/30/2020 1344   APPEARANCEUR CLEAR 11/30/2020 1344   LABSPEC 1.005 11/30/2020 1344   PHURINE 6.0 11/30/2020 1344   GLUCOSEU NEGATIVE 11/30/2020 1344   HGBUR NEGATIVE 11/30/2020 1344   BILIRUBINUR NEGATIVE 11/30/2020 1344   KETONESUR 5 (A) 11/30/2020 1344   PROTEINUR NEGATIVE 11/30/2020 1344   UROBILINOGEN 0.2 11/08/2011 1445   NITRITE NEGATIVE 11/30/2020 1344   LEUKOCYTESUR TRACE (A) 11/30/2020 1344   Sepsis Labs: @LABRCNTIP (procalcitonin:4,lacticidven:4) ) Recent Results (from the past 240 hour(s))  Culture, blood (routine x 2)     Status: None (Preliminary result)   Collection Time: 11/30/20  7:25 PM   Specimen: BLOOD  Result Value Ref Range Status   Specimen Description   Final    BLOOD BLOOD LEFT FOREARM Performed at [facility], [place] [address] [place], [place] [address]    Special Requests   Final    BOTTLES DRAWN AEROBIC AND ANAEROBIC Blood Culture results may not be optimal due to an inadequate volume of blood received in culture bottles Performed at [facility] [address] [place],  [address]    Culture PENDING  Incomplete   Report Status PENDING  Incomplete     Radiological Exams on Admission: DG Chest Port 1 View  Result Date: 11/30/2020 CLINICAL DATA:  Fever, hypokalemia, altered mental status EXAM: PORTABLE CHEST 1 VIEW COMPARISON:  CT 02/09/2012, radiograph 02/09/2012 FINDINGS: Low lung volumes with some hazy opacities atelectasis or pulmonary edema given the presence of pulmonary vascular congestion and fissural thickening. Prominence of the cardiomediastinal silhouette may be accentuated by the portable technique and/or low volumes. The aorta is calcified. The remaining cardiomediastinal contours are unremarkable. No acute osseous or soft tissue abnormality. Degenerative changes are present in the imaged spine and shoulders. IMPRESSION: Low lung volumes with hazy opacities likely reflect atelectasis or early edema  given pulmonary vascular congestion. Underlying infection difficult to exclude in the setting of the fever. Electronically Signed   By: Lovena Le M.D.   On: 11/30/2020 19:22   CT Renal Stone Study  Result Date: 11/30/2020 CLINICAL DATA:  Flank pain EXAM: CT ABDOMEN AND  PELVIS WITHOUT CONTRAST TECHNIQUE: Multidetector CT imaging of the abdomen and pelvis was performed following the standard protocol without IV contrast. COMPARISON:  CT abdomen dated 01/17/2019 FINDINGS: Lower chest: No acute abnormality. Hepatobiliary: No focal liver abnormality is seen. Gallbladder is unremarkable. No bile duct dilatation. Pancreas: Unremarkable. No pancreatic ductal dilatation or surrounding inflammatory changes. Spleen: Normal in size without focal abnormality. Adrenals/Urinary Tract: Adrenal glands are unremarkable. 4 mm RIGHT renal stone. Kidneys otherwise unremarkable without hydronephrosis or perinephric inflammation. No ureteral or bladder calculi. Bladder is unremarkable. Stomach/Bowel: No dilated large or small bowel loops. No evidence of bowel wall inflammation. Appendix is not seen but there are no inflammatory changes about the cecum to suggest acute appendicitis. Stomach is unremarkable, partially decompressed. Vascular/Lymphatic: Aortic atherosclerosis. No enlarged lymph nodes are seen within the abdomen or pelvis. Reproductive: Presumed hysterectomy.  No adnexal mass or free fluid. Other: No free fluid or abscess collection. No free intraperitoneal air. Musculoskeletal: No acute-appearing osseous abnormality. Stable chronic compression fracture deformities of the L1, L3 and L5 vertebral bodies. IMPRESSION: 1. No acute findings within the abdomen or pelvis. No bowel obstruction or evidence of bowel wall inflammation. No evidence of acute solid organ abnormality. No free fluid or inflammatory change. 2. 4 mm nonobstructing RIGHT renal stone. No ureteral or bladder calculi. 3. Chronic compression fracture  deformities of the L1, L3 and L5 vertebral bodies, not significantly changed compared to earlier CT of 01/17/2019. Aortic Atherosclerosis (ICD10-I70.0). Electronically Signed   By: Franki Cabot M.D.   On: 11/30/2020 12:42    EKG: Independently reviewed.  Sinus tachycardia.  Assessment/Plan   1. SIRS source not clear for now patient will be on empiric antibiotics continue with hydration follow blood cultures. 2. History of depression on Abilify and Cymbalta.  Medications confirmed with patient. 3. Anemia appears to be chronic. 4. Hypothyroidism on Synthroid. 5. Patient is also on gabapentin likely from neuropathy.  Since patient has septic patient on admission will need close monitoring for any further worsening in inpatient status.   DVT prophylaxis: Lovenox. Code Status: Full code. Family Communication: Discussed with patient. Disposition Plan: Home when stable. Consults called: None. Admission status: Inpatient.   Rise Patience MD [facility] Pager [phone].  If 7PM-7AM, please contact night-coverage [email or web address] Password [facility]  11/30/2020, 10:31 PM

## 2020-11-30 NOTE — ED Triage Notes (Signed)
EMS reports from home, Painful urination since yesterday also c/o chronic back pain.  BP 126/80 HR 86 RR 18 Sp02 96 RA CBG 96  20ga L forearm 1000 Tylenol 231ml NS enroute

## 2020-12-01 DIAGNOSIS — B9561 Methicillin susceptible Staphylococcus aureus infection as the cause of diseases classified elsewhere: Secondary | ICD-10-CM

## 2020-12-01 DIAGNOSIS — F32A Depression, unspecified: Secondary | ICD-10-CM

## 2020-12-01 DIAGNOSIS — R6510 Systemic inflammatory response syndrome (SIRS) of non-infectious origin without acute organ dysfunction: Secondary | ICD-10-CM

## 2020-12-01 DIAGNOSIS — R7881 Bacteremia: Secondary | ICD-10-CM | POA: Diagnosis not present

## 2020-12-01 DIAGNOSIS — M5416 Radiculopathy, lumbar region: Secondary | ICD-10-CM

## 2020-12-01 DIAGNOSIS — A4102 Sepsis due to Methicillin resistant Staphylococcus aureus: Secondary | ICD-10-CM

## 2020-12-01 DIAGNOSIS — C211 Malignant neoplasm of anal canal: Secondary | ICD-10-CM

## 2020-12-01 DIAGNOSIS — E785 Hyperlipidemia, unspecified: Secondary | ICD-10-CM

## 2020-12-01 DIAGNOSIS — Z923 Personal history of irradiation: Secondary | ICD-10-CM

## 2020-12-01 DIAGNOSIS — G9341 Metabolic encephalopathy: Secondary | ICD-10-CM

## 2020-12-01 LAB — BLOOD CULTURE ID PANEL (REFLEXED) - BCID2

## 2020-12-01 LAB — COMPREHENSIVE METABOLIC PANEL
ALT: 25 U/L (ref 0–44)
AST: 31 U/L (ref 15–41)
Albumin: 3.3 g/dL — ABNORMAL LOW (ref 3.5–5.0)
Alkaline Phosphatase: 55 U/L (ref 38–126)
Anion gap: 9 (ref 5–15)
BUN: 13 mg/dL (ref 8–23)
CO2: 21 mmol/L — ABNORMAL LOW (ref 22–32)
Calcium: 8.3 mg/dL — ABNORMAL LOW (ref 8.9–10.3)
Chloride: 107 mmol/L (ref 98–111)
Creatinine, Ser: 0.74 mg/dL (ref 0.44–1.00)
GFR, Estimated: >60 mL/min (ref >60)
Glucose, Bld: 108 mg/dL — ABNORMAL HIGH (ref 70–99)
Potassium: 3.4 mmol/L — ABNORMAL LOW (ref 3.5–5.1)
Sodium: 137 mmol/L (ref 135–145)
Total Bilirubin: 0.7 mg/dL (ref 0.3–1.2)
Total Protein: 6.3 g/dL — ABNORMAL LOW (ref 6.5–8.1)

## 2020-12-01 LAB — CBC WITH DIFFERENTIAL/PLATELET
Abs Immature Granulocytes: 0.06 10*3/uL (ref 0.00–0.07)
Basophils Absolute: 0 10*3/uL (ref 0.0–0.1)
Basophils Relative: 0 %
Eosinophils Absolute: 0 10*3/uL (ref 0.0–0.5)
Eosinophils Relative: 0 %
HCT: 32.7 % — ABNORMAL LOW (ref 36.0–46.0)
Hemoglobin: 10.6 g/dL — ABNORMAL LOW (ref 12.0–15.0)
Immature Granulocytes: 1 %
Lymphocytes Relative: 3 %
Lymphs Abs: 0.3 10*3/uL — ABNORMAL LOW (ref 0.7–4.0)
MCH: 32.7 pg (ref 26.0–34.0)
MCHC: 32.4 g/dL (ref 30.0–36.0)
MCV: 100.9 fL — ABNORMAL HIGH (ref 80.0–100.0)
Monocytes Absolute: 0.5 10*3/uL (ref 0.1–1.0)
Monocytes Relative: 5 %
Neutro Abs: 8.2 10*3/uL — ABNORMAL HIGH (ref 1.7–7.7)
Neutrophils Relative %: 91 %
Platelets: 162 10*3/uL (ref 150–400)
RBC: 3.24 MIL/uL — ABNORMAL LOW (ref 3.87–5.11)
RDW: 14.1 % (ref 11.5–15.5)
WBC: 9.1 10*3/uL (ref 4.0–10.5)
nRBC: 0 % (ref 0.0–0.2)

## 2020-12-01 LAB — CBG MONITORING, ED
Glucose-Capillary: 60 mg/dL — ABNORMAL LOW (ref 70–99)
Glucose-Capillary: 90 mg/dL (ref 70–99)

## 2020-12-01 LAB — CREATININE, SERUM
Creatinine, Ser: 0.62 mg/dL (ref 0.44–1.00)
GFR, Estimated: >60 mL/min (ref >60)

## 2020-12-01 LAB — TSH: TSH: 0.63 u[IU]/mL (ref 0.350–4.500)

## 2020-12-01 LAB — URINE CULTURE: Culture: NO GROWTH

## 2020-12-01 LAB — PROCALCITONIN: Procalcitonin: 1.15 ng/mL

## 2020-12-01 LAB — TROPONIN I (HIGH SENSITIVITY)
Troponin I (High Sensitivity): 17 ng/L (ref <18)
Troponin I (High Sensitivity): 18 ng/L — ABNORMAL HIGH (ref <18)

## 2020-12-01 LAB — SARS CORONAVIRUS 2 BY RT PCR (HOSPITAL ORDER, PERFORMED IN ~~LOC~~ HOSPITAL LAB): SARS Coronavirus 2: NEGATIVE

## 2020-12-01 MED ORDER — DEXTROSE-NACL 5-0.9 % IV SOLN: INTRAVENOUS | Status: DC

## 2020-12-01 MED ORDER — VANCOMYCIN HCL 1250 MG/250ML IV SOLN
1250.0000 mg | INTRAVENOUS | Status: DC
  Filled 2020-12-01: qty 250

## 2020-12-01 MED ORDER — KETOROLAC TROMETHAMINE 30 MG/ML IJ SOLN
15.0000 mg | Freq: Once | INTRAMUSCULAR | Status: AC
  Administered 2020-12-01: 15 mg via INTRAVENOUS
  Filled 2020-12-01: qty 1

## 2020-12-01 MED ORDER — HYDROCODONE-ACETAMINOPHEN 5-325 MG PO TABS
1.0000 | ORAL_TABLET | Freq: Once | ORAL | Status: AC
  Administered 2020-12-01: 1 via ORAL
  Filled 2020-12-01: qty 1

## 2020-12-01 MED ORDER — POTASSIUM CHLORIDE CRYS ER 20 MEQ PO TBCR
40.0000 meq | EXTENDED_RELEASE_TABLET | Freq: Two times a day (BID) | ORAL | Status: AC
  Administered 2020-12-01 – 2020-12-02 (×3): 40 meq via ORAL
  Filled 2020-12-01 (×3): qty 2

## 2020-12-01 MED ORDER — POTASSIUM CHLORIDE CRYS ER 20 MEQ PO TBCR
20.0000 meq | EXTENDED_RELEASE_TABLET | Freq: Once | ORAL | Status: AC
  Administered 2020-12-01: 20 meq via ORAL
  Filled 2020-12-01: qty 1

## 2020-12-01 MED ORDER — CEFAZOLIN SODIUM-DEXTROSE 2-4 GM/100ML-% IV SOLN
2.0000 g | Freq: Three times a day (TID) | INTRAVENOUS | Status: DC
  Administered 2020-12-01 – 2020-12-06 (×14): 2 g via INTRAVENOUS
  Filled 2020-12-01 (×16): qty 100

## 2020-12-01 NOTE — Progress Notes (Signed)
   12/01/20 2035  Assess: MEWS Score  Temp (!) 101.2 F (38.4 C)  BP (!) 147/73  Pulse Rate (!) 106  Resp (!) 24  SpO2 95 %  Assess: MEWS Score  MEWS Temp 1  MEWS Systolic 0  MEWS Pulse 1  MEWS RR 1  MEWS LOC 0  MEWS Score 3  MEWS Score Color Yellow  Assess: if the MEWS score is Yellow or Red  Were vital signs taken at a resting state? Yes  Focused Assessment No change from prior assessment  Early Detection of Sepsis Score *See Row Information* High  MEWS guidelines implemented *See Row Information* Yes  Treat  MEWS Interventions Administered scheduled meds/treatments;Escalated (See documentation below)  Take Vital Signs  Increase Vital Sign Frequency  Yellow: Q 2hr X 2 then Q 4hr X 2, if remains yellow, continue Q 4hrs  Escalate  MEWS: Escalate Yellow: discuss with charge nurse/RN and consider discussing with provider and RRT  Notify: Charge Nurse/RN  Name of Charge Nurse/RN Notified Rich Fuchs, RN  Date Charge Nurse/RN Notified 12/01/20  Time Charge Nurse/RN Notified 2035  Document  Progress note created (see row info) Yes

## 2020-12-01 NOTE — Consult Note (Signed)
[facility] Infectious Disease    Date of Admission:  11/30/2020   Total days of antibiotics: 1 cefepime/vanco/flagyl --> ancef               Reason for Consult: Staph bacteremia    Referring Provider: CHAMP!   Assessment: Staph bacteremia Encephalopathy  Plan: 1. Continue ancef 2. Repeat BCx in AM 3. Check TEE 4. Watch her clinically, especially as her mental status clears.   Comment: She has occult staph bacteremia and is at high risk for endocarditis. A full hx is difficult to obtain with her mental status.   Thank you so much for this interesting consult,  Principal Problem:   SIRS (systemic inflammatory response syndrome) (HCC) Active Problems:   Malignant neoplasm of anal canal ([place])   . ARIPiprazole  10 mg Oral Daily  . DULoxetine  120 mg Oral Daily  . enoxaparin (LOVENOX) injection  40 mg Subcutaneous Q24H  . folic acid  [id number] mcg Oral Daily  . gabapentin  600 mg Oral BID  . levothyroxine  25 mcg Oral Q0600  . potassium chloride  40 mEq Oral BID  . pravastatin  40 mg Oral Q breakfast  . traZODone  100 mg Oral QHS  . vitamin B-12  500 mcg Oral Daily    HPI: [name] is a 75 y.o. female with hx of visit to ED on 1-30 with 2 weeks of feeling poorly. She c/o of dysuria and had CT stone study which was (-) for stones, +compression fractures.  She went home only to return with confusion, temp 103, tachycardia, elevated lactic acid level.  She was started on vanco/cefepime/flagyl. She is now found to 3/4 BCx+ MSSA.  She has been switched to ancef.   Review of Systems: Review of Systems  Unable to perform ROS: Mental acuity  Constitutional: Positive for fever. Negative for chills.  Gastrointestinal: Negative for constipation and diarrhea.    Past Medical History:  Diagnosis Date  . 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 Anxiety   . Depression   . Full dentures   . Hemorrhoids    external  . History of radiation therapy  11/29/11 to 01/05/12   anal canal 5040 cGy 28 sessions, regional lymph nodes 4200 cGy 28 sessions  . Hyperlipidemia   . Rectal bleeding   . Vitamin D deficiency   . Wears glasses     Social History   Tobacco Use  . Smoking status: Former Smoker    Packs/day: 0.50    Years: 25.00    Pack years: 12.50    Types: Cigarettes    Quit date: 09/07/1987    Years since quitting: 33.2  . Smokeless tobacco: Never Used  Vaping Use  . Vaping Use: Never used  Substance Use Topics  . Alcohol use: No  . Drug use: No    Family History  Problem Relation Age of Onset  . Cancer Sister        breast/ age 8  . Cervical cancer Sister        85  . Alcohol abuse Maternal Uncle      Medications:  Scheduled: . ARIPiprazole  10 mg Oral Daily  . DULoxetine  120 mg Oral Daily  . enoxaparin (LOVENOX) injection  40 mg Subcutaneous Q24H  . folic acid  [id number] mcg Oral Daily  . gabapentin  600 mg Oral BID  . levothyroxine  25 mcg  Oral Q0600  . potassium chloride  40 mEq Oral BID  . pravastatin  40 mg Oral Q breakfast  . traZODone  100 mg Oral QHS  . vitamin B-12  500 mcg Oral Daily    Abtx:  Anti-infectives (From admission, onward)   Start     Dose/Rate Route Frequency Ordered Stop   12/01/20 2200  ceFAZolin (ANCEF) IVPB 2g/100 mL premix        2 g 200 mL/hr over 30 Minutes Intravenous Every 8 hours 12/01/20 1347     12/01/20 2000  vancomycin (VANCOREADY) IVPB 1250 mg/250 mL  Status:  Discontinued        1,250 mg 166.7 mL/hr over 90 Minutes Intravenous Every 24 hours 12/01/20 0005 12/01/20 1347   12/01/20 0400  ceFEPIme (MAXIPIME) 2 g in sodium chloride 0.9 % 100 mL IVPB  Status:  Discontinued        2 g 200 mL/hr over 30 Minutes Intravenous Every 8 hours 11/30/20 2230 12/01/20 1347   11/30/20 2245  metroNIDAZOLE (FLAGYL) IVPB 500 mg  Status:  Discontinued        500 mg 100 mL/hr over 60 Minutes Intravenous Every 8 hours 11/30/20 2230 12/01/20 1347   11/30/20 2245  vancomycin (VANCOCIN) IVPB  1000 mg/200 mL premix  Status:  Discontinued        1,000 mg 200 mL/hr over 60 Minutes Intravenous  Once 11/30/20 2230 11/30/20 2237   11/30/20 1900  vancomycin (VANCOREADY) IVPB 1750 mg/350 mL        1,750 mg 175 mL/hr over 120 Minutes Intravenous STAT 11/30/20 1850 11/30/20 2231   11/30/20 1900  ceFEPIme (MAXIPIME) 2 g in sodium chloride 0.9 % 100 mL IVPB        2 g 200 mL/hr over 30 Minutes Intravenous STAT 11/30/20 1850 11/30/20 2023        OBJECTIVE: Blood pressure (!) 122/104, pulse (!) 104, temperature 100 F (37.8 C), temperature source Oral, resp. rate 18, SpO2 100 %.  Physical Exam Vitals reviewed.  HENT:     Mouth/Throat:     Mouth: Mucous membranes are moist.  Eyes:     Extraocular Movements: Extraocular movements intact.     Pupils: Pupils are equal, round, and reactive to light.     Comments: No photophobia  Cardiovascular:     Rate and Rhythm: Rhythm irregular.  Pulmonary:     Effort: Pulmonary effort is normal.     Breath sounds: Normal breath sounds.  Abdominal:     General: Bowel sounds are normal. There is distension.     Palpations: Abdomen is soft.     Tenderness: There is no abdominal tenderness.  Musculoskeletal:     Cervical back: Normal range of motion and neck supple. No rigidity.     Right lower leg: No edema.     Left lower leg: No edema.       Feet:  Neurological:     Mental Status: She is alert.     Lab Results Results for orders placed or performed during the hospital encounter of 11/30/20 (from the past 48 hour(s))  Culture, blood (routine x 2)     Status: None (Preliminary result)   Collection Time: 11/30/20  7:25 PM   Specimen: BLOOD  Result Value Ref Range   Specimen Description      BLOOD BLOOD LEFT FOREARM Performed at [facility], [place] [address] [place], [place] [address]    Special Requests  BOTTLES DRAWN AEROBIC AND ANAEROBIC Blood Culture results may not be optimal due to an inadequate volume  of blood received in culture bottles   Culture  Setup Time      GRAM POSITIVE COCCI IN CLUSTERS IN BOTH AEROBIC AND ANAEROBIC BOTTLES Organism ID to follow CRITICAL RESULT CALLED TO, READ BACK BY AND VERIFIED WITH: Melodye Ped PharmD 13:20 12/01/20 ([email or web address]) Performed at [facility], [place] [address], [place], [place] [address]    Culture GRAM POSITIVE COCCI    Report Status PENDING   Basic metabolic panel     Status: Abnormal   Collection Time: 11/30/20  7:25 PM  Result Value Ref Range   Sodium 139 135 - 145 mmol/L   Potassium 3.3 (L) 3.5 - 5.1 mmol/L   Chloride 102 98 - 111 mmol/L   CO2 22 22 - 32 mmol/L   Glucose, Bld 107 (H) 70 - 99 mg/dL    Comment: Glucose reference range applies only to samples taken after fasting for at least 8 hours.   BUN 14 8 - 23 mg/dL   Creatinine, Ser 0.71 0.44 - 1.00 mg/dL   Calcium 9.5 8.9 - 10.3 mg/dL   GFR, Estimated >60 >60 mL/min    Comment: (NOTE) Calculated using the CKD-EPI Creatinine Equation (2021)    Anion gap 15 5 - 15    Comment: Performed at [facility], [place] [address], [place], [place] [address]  CBC with Differential     Status: Abnormal   Collection Time: 11/30/20  7:25 PM  Result Value Ref Range   WBC 10.6 (H) 4.0 - 10.5 K/uL   RBC 3.83 (L) 3.87 - 5.11 MIL/uL   Hemoglobin 12.6 12.0 - 15.0 g/dL   HCT 38.2 36.0 - 46.0 %   MCV 99.7 80.0 - 100.0 fL   MCH 32.9 26.0 - 34.0 pg   MCHC 33.0 30.0 - 36.0 g/dL   RDW 13.8 11.5 - 15.5 %   Platelets 188 150 - 400 K/uL   nRBC 0.0 0.0 - 0.2 %   Neutrophils Relative % 93 %   Neutro Abs 9.8 (H) 1.7 - 7.7 K/uL   Lymphocytes Relative 3 %   Lymphs Abs 0.3 (L) 0.7 - 4.0 K/uL   Monocytes Relative 3 %   Monocytes Absolute 0.4 0.1 - 1.0 K/uL   Eosinophils Relative 0 %   Eosinophils Absolute 0.0 0.0 - 0.5 K/uL   Basophils Relative 0 %   Basophils Absolute 0.0 0.0 - 0.1 K/uL   Immature Granulocytes 1 %   Abs Immature Granulocytes 0.08 (H) 0.00 - 0.07 K/uL    Comment:  Performed at [facility], [place] [address], [place], [place] [address]  SARS Coronavirus 2 by RT PCR (hospital order, performed in [facility] hospital lab) Nasopharyngeal Nasopharyngeal Swab     Status: None   Collection Time: 11/30/20  7:25 PM   Specimen: Nasopharyngeal Swab  Result Value Ref Range   SARS Coronavirus 2 NEGATIVE NEGATIVE    Comment: (NOTE) SARS-CoV-2 target nucleic acids are NOT DETECTED.  The SARS-CoV-2 RNA is generally detectable in upper and lower respiratory specimens during the acute phase of infection. The lowest concentration of SARS-CoV-2 viral copies this assay can detect is 250 copies / mL. A negative result does not preclude SARS-CoV-2 infection and should not be used as the sole basis for treatment or other patient management decisions.  A negative result may occur with improper specimen collection / handling, submission of  specimen other than nasopharyngeal swab, presence of viral mutation(s) within the areas targeted by this assay, and inadequate number of viral copies (<250 copies / mL). A negative result must be combined with clinical observations, patient history, and epidemiological information.  Fact Sheet for Patients:   StrictlyIdeas.no  Fact Sheet for Healthcare Providers: BankingDealers.co.za  This test is not yet approved or  cleared by the [place] FDA and has been authorized for detection and/or diagnosis of SARS-CoV-2 by FDA under an Emergency Use Authorization (EUA).  This EUA will remain in effect (meaning this test can be used) for the duration of the COVID-19 declaration under Section 564(b)(1) of the Act, 21 U.S.C. section 360bbb-3(b)(1), unless the authorization is terminated or revoked sooner.  Performed at [facility], [place] [address] [place], [place] [address]   Lactic acid, plasma     Status: Abnormal   Collection Time: 11/30/20  7:25  PM  Result Value Ref Range   Lactic Acid, Venous 3.6 (HH) 0.5 - 1.9 mmol/L    Comment: CRITICAL RESULT CALLED TO, READ BACK BY AND VERIFIED WITH: Roosevelt Locks RN 11/30/20 @2025  BY P.HENDERSON Performed at [facility], [place] [address], [place], [place] [address]   Troponin I (High Sensitivity)     Status: None   Collection Time: 11/30/20  7:25 PM  Result Value Ref Range   Troponin I (High Sensitivity) 14 <18 ng/L    Comment: (NOTE) Elevated high sensitivity troponin I (hsTnI) values and significant  changes across serial measurements may suggest ACS but many other  chronic and acute conditions are known to elevate hsTnI results.  Refer to the "Links" section for chest pain algorithms and additional  guidance. Performed at [facility], [place] [address], [place], [place] [address]   Protime-INR     Status: None   Collection Time: 11/30/20  7:25 PM  Result Value Ref Range   Prothrombin Time 14.1 11.4 - 15.2 seconds   INR 1.1 0.8 - 1.2    Comment: (NOTE) INR goal varies based on device and disease states. Performed at [facility], [place] [address] [place], [place] [address]   Blood Culture ID Panel (Reflexed)     Status: Abnormal   Collection Time: 11/30/20  7:25 PM  Result Value Ref Range   Enterococcus faecalis NOT DETECTED NOT DETECTED   Enterococcus Faecium NOT DETECTED NOT DETECTED   Listeria monocytogenes NOT DETECTED NOT DETECTED   Staphylococcus species DETECTED (A) NOT DETECTED    Comment: CRITICAL RESULT CALLED TO, READ BACK BY AND VERIFIED WITH: Melodye Ped PharmD 13:20 12/01/20 ([email or web address])    Staphylococcus aureus (BCID) DETECTED (A) NOT DETECTED    Comment: CRITICAL RESULT CALLED TO, READ BACK BY AND VERIFIED WITH: Melodye Ped PharmD 13:20 12/01/20 ([email or web address])    Staphylococcus epidermidis NOT DETECTED NOT DETECTED   Staphylococcus lugdunensis NOT DETECTED NOT DETECTED   Streptococcus species NOT DETECTED NOT  DETECTED   Streptococcus agalactiae NOT DETECTED NOT DETECTED   Streptococcus pneumoniae NOT DETECTED NOT DETECTED   Streptococcus pyogenes NOT DETECTED NOT DETECTED   A.calcoaceticus-baumannii NOT DETECTED NOT DETECTED   Bacteroides fragilis NOT DETECTED NOT DETECTED   Enterobacterales NOT DETECTED NOT DETECTED   Enterobacter cloacae complex NOT DETECTED NOT DETECTED   Escherichia coli NOT DETECTED NOT DETECTED   Klebsiella aerogenes NOT DETECTED NOT DETECTED   Klebsiella oxytoca NOT DETECTED NOT DETECTED   Klebsiella pneumoniae NOT DETECTED NOT DETECTED   Proteus species NOT DETECTED NOT  DETECTED   Salmonella species NOT DETECTED NOT DETECTED   Serratia marcescens NOT DETECTED NOT DETECTED   Haemophilus influenzae NOT DETECTED NOT DETECTED   Neisseria meningitidis NOT DETECTED NOT DETECTED   Pseudomonas aeruginosa NOT DETECTED NOT DETECTED   Stenotrophomonas maltophilia NOT DETECTED NOT DETECTED   Candida albicans NOT DETECTED NOT DETECTED   Candida auris NOT DETECTED NOT DETECTED   Candida glabrata NOT DETECTED NOT DETECTED   Candida krusei NOT DETECTED NOT DETECTED   Candida parapsilosis NOT DETECTED NOT DETECTED   Candida tropicalis NOT DETECTED NOT DETECTED   Cryptococcus neoformans/gattii NOT DETECTED NOT DETECTED   Meth resistant mecA/C and MREJ NOT DETECTED NOT DETECTED    Comment: Performed at [facility], [place] [address], [place], [place] [address]  Culture, blood (Routine X 2) w Reflex to ID Panel     Status: None (Preliminary result)   Collection Time: 11/30/20  8:06 PM   Specimen: BLOOD  Result Value Ref Range   Specimen Description      BLOOD BLOOD LEFT HAND Performed at [place] [address] [place], [place] [address]    Special Requests      BOTTLES DRAWN AEROBIC AND ANAEROBIC Blood Culture results may not be optimal due to an inadequate volume of blood received in culture bottles Performed at [facility], [place] [address], [place], [place] [address]    Culture  Setup Time      GRAM POSITIVE COCCI IN CLUSTERS IN BOTH AEROBIC AND ANAEROBIC BOTTLES CRITICAL VALUE NOTED.  VALUE IS CONSISTENT WITH PREVIOUSLY REPORTED AND CALLED VALUE. Performed at [facility], [place] [address], [place], [place] [address]    Culture GRAM POSITIVE COCCI    Report Status PENDING   Lactic acid, plasma     Status: None   Collection Time: 11/30/20  8:40 PM  Result Value Ref Range   Lactic Acid, Venous 1.6 0.5 - 1.9 mmol/L    Comment: Performed at [facility], [place] [address], [place], [place] [address]  Troponin I (High Sensitivity)     Status: Abnormal   Collection Time: 11/30/20  8:40 PM  Result Value Ref Range   Troponin I (High Sensitivity) 21 (H) <18 ng/L    Comment: (NOTE) Elevated high sensitivity troponin I (hsTnI) values and significant  changes across serial measurements may suggest ACS but many other  chronic and acute conditions are known to elevate hsTnI results.  Refer to the "Links" section for chest pain algorithms and additional  guidance. Performed at [facility], [place] [address], [place], [place] [address]   Protime-INR     Status: Abnormal   Collection Time: 11/30/20 10:25 PM  Result Value Ref Range   Prothrombin Time 16.1 (H) 11.4 - 15.2 seconds   INR 1.3 (H) 0.8 - 1.2    Comment: (NOTE) INR goal varies based on device and disease states. Performed at [facility], [place] [address] [place], [place] [address]   APTT     Status: Abnormal   Collection Time: 11/30/20 10:25 PM  Result Value Ref Range   aPTT 38 (H) 24 - 36 seconds    Comment:        IF BASELINE aPTT IS ELEVATED, SUGGEST PATIENT RISK ASSESSMENT BE USED TO DETERMINE APPROPRIATE ANTICOAGULANT THERAPY. Performed at [facility], [place] [address], [place], [place] [address]   CBC     Status: Abnormal   Collection  Time: 11/30/20 10:25 PM  Result  Value Ref Range   WBC 8.5 4.0 - 10.5 K/uL   RBC 3.06 (L) 3.87 - 5.11 MIL/uL   Hemoglobin 9.9 (L) 12.0 - 15.0 g/dL   HCT 30.8 (L) 36.0 - 46.0 %   MCV 100.7 (H) 80.0 - 100.0 fL   MCH 32.4 26.0 - 34.0 pg   MCHC 32.1 30.0 - 36.0 g/dL   RDW 13.9 11.5 - 15.5 %   Platelets 146 (L) 150 - 400 K/uL   nRBC 0.0 0.0 - 0.2 %    Comment: Performed at [facility], [place] [address], [place], [place] [address]  Creatinine, serum     Status: None   Collection Time: 11/30/20 10:25 PM  Result Value Ref Range   Creatinine, Ser 0.62 0.44 - 1.00 mg/dL   GFR, Estimated >60 >60 mL/min    Comment: (NOTE) Calculated using the CKD-EPI Creatinine Equation (2021) Performed at [facility], [place] [address], [place], [place] [address]   Procalcitonin     Status: None   Collection Time: 11/30/20 10:25 PM  Result Value Ref Range   Procalcitonin 1.15 ng/mL    Comment:        Interpretation: PCT > 0.5 ng/mL and <= 2 ng/mL: Systemic infection (sepsis) is possible, but other conditions are known to elevate PCT as well. (NOTE)       Sepsis PCT Algorithm           Lower Respiratory Tract                                      Infection PCT Algorithm    ----------------------------     ----------------------------         PCT < 0.25 ng/mL                PCT < 0.10 ng/mL          Strongly encourage             Strongly discourage   discontinuation of antibiotics    initiation of antibiotics    ----------------------------     -----------------------------       PCT 0.25 - 0.50 ng/mL            PCT 0.10 - 0.25 ng/mL               OR       >80% decrease in PCT            Discourage initiation of                                            antibiotics      Encourage discontinuation           of antibiotics    ----------------------------     -----------------------------         PCT >= 0.50 ng/mL              PCT 0.26 - 0.50 ng/mL                AND       <80% decrease in PCT              Encourage initiation of  antibiotics       Encourage continuation           of antibiotics    ----------------------------     -----------------------------        PCT >= 0.50 ng/mL                  PCT > 0.50 ng/mL               AND         increase in PCT                  Strongly encourage                                      initiation of antibiotics    Strongly encourage escalation           of antibiotics                                     -----------------------------                                           PCT <= 0.25 ng/mL                                                 OR                                        > 80% decrease in PCT                                      Discontinue / Do not initiate                                             antibiotics  Performed at [place] [address] [place], [place] [address]   Troponin I (High Sensitivity)     Status: Abnormal   Collection Time: 11/30/20 10:25 PM  Result Value Ref Range   Troponin I (High Sensitivity) 18 (H) <18 ng/L    Comment: (NOTE) Elevated high sensitivity troponin I (hsTnI) values and significant  changes across serial measurements may suggest ACS but many other  chronic and acute conditions are known to elevate hsTnI results.  Refer to the "Links" section for chest pain algorithms and additional  guidance. Performed at [facility], [place] [address] [place], [place] [address]   TSH     Status: None   Collection Time: 11/30/20 10:26 PM  Result Value Ref Range   TSH 0.630 0.350 - 4.500 uIU/mL    Comment: Performed by a 3rd Generation assay with a functional sensitivity of <=0.01 uIU/mL. Performed at [facility], [place] [address], [place], [place] [address]  Troponin I (High Sensitivity)     Status: None   Collection Time: 12/01/20 12:15 AM  Result Value Ref Range   Troponin I (High  Sensitivity) 17 <18 ng/L    Comment: (NOTE) Elevated high sensitivity troponin I (hsTnI) values and significant  changes across serial measurements may suggest ACS but many other  chronic and acute conditions are known to elevate hsTnI results.  Refer to the "Links" section for chest pain algorithms and additional  guidance. Performed at [facility], [place] [address], [place], [place] [address]   CBC with Differential     Status: Abnormal   Collection Time: 12/01/20  2:55 AM  Result Value Ref Range   WBC 9.1 4.0 - 10.5 K/uL   RBC 3.24 (L) 3.87 - 5.11 MIL/uL   Hemoglobin 10.6 (L) 12.0 - 15.0 g/dL   HCT 32.7 (L) 36.0 - 46.0 %   MCV 100.9 (H) 80.0 - 100.0 fL   MCH 32.7 26.0 - 34.0 pg   MCHC 32.4 30.0 - 36.0 g/dL   RDW 14.1 11.5 - 15.5 %   Platelets 162 150 - 400 K/uL   nRBC 0.0 0.0 - 0.2 %   Neutrophils Relative % 91 %   Neutro Abs 8.2 (H) 1.7 - 7.7 K/uL   Lymphocytes Relative 3 %   Lymphs Abs 0.3 (L) 0.7 - 4.0 K/uL   Monocytes Relative 5 %   Monocytes Absolute 0.5 0.1 - 1.0 K/uL   Eosinophils Relative 0 %   Eosinophils Absolute 0.0 0.0 - 0.5 K/uL   Basophils Relative 0 %   Basophils Absolute 0.0 0.0 - 0.1 K/uL   Immature Granulocytes 1 %   Abs Immature Granulocytes 0.06 0.00 - 0.07 K/uL    Comment: Performed at [facility], [place] [address], [place], [place] [address]  Comprehensive metabolic panel     Status: Abnormal   Collection Time: 12/01/20  2:55 AM  Result Value Ref Range   Sodium 137 135 - 145 mmol/L   Potassium 3.4 (L) 3.5 - 5.1 mmol/L   Chloride 107 98 - 111 mmol/L   CO2 21 (L) 22 - 32 mmol/L   Glucose, Bld 108 (H) 70 - 99 mg/dL    Comment: Glucose reference range applies only to samples taken after fasting for at least 8 hours.   BUN 13 8 - 23 mg/dL   Creatinine, Ser 0.74 0.44 - 1.00 mg/dL   Calcium 8.3 (L) 8.9 - 10.3 mg/dL   Total Protein 6.3 (L) 6.5 - 8.1 g/dL   Albumin 3.3 (L) 3.5 - 5.0 g/dL   AST 31 15 - 41 U/L   ALT  25 0 - 44 U/L   Alkaline Phosphatase 55 38 - 126 U/L   Total Bilirubin 0.7 0.3 - 1.2 mg/dL   GFR, Estimated >60 >60 mL/min    Comment: (NOTE) Calculated using the CKD-EPI Creatinine Equation (2021)    Anion gap 9 5 - 15    Comment: Performed at [facility], [place] [address], [place], [place] [address]      Component Value Date/Time   SDES  11/30/2020 2006    BLOOD BLOOD LEFT HAND Performed at [place] [address] [place], [place] [address]    SPECREQUEST  11/30/2020 2006    BOTTLES DRAWN AEROBIC AND ANAEROBIC Blood Culture results may not be optimal due to an inadequate volume of blood received in culture bottles Performed at [facility], [place]  [address], [place], [place] [address]    [place] Dose POSITIVE COCCI 11/30/2020 2006   REPTSTATUS PENDING 11/30/2020 2006   CT Head Wo Contrast  Result Date: 11/30/2020 CLINICAL DATA:  Delirium.  Discharged today. EXAM: CT HEAD WITHOUT CONTRAST TECHNIQUE: Contiguous axial images were obtained from the base of the skull through the vertex without intravenous contrast. COMPARISON:  None. FINDINGS: Brain: Normal for age atrophy. No intracranial hemorrhage, mass effect, or midline shift. No hydrocephalus. The basilar cisterns are patent. No evidence of territorial infarct or acute ischemia. No extra-axial or intracranial fluid collection. Vascular: Atherosclerosis of skullbase vasculature without hyperdense vessel or abnormal calcification. Skull: No fracture or focal lesion. Sinuses/Orbits: Minor mucosal thickening of ethmoid air cells. No sinus air-fluid levels. Mastoid air cells are clear. Orbits are unremarkable. Other: None. IMPRESSION: 1. No acute intracranial abnormality. 2. Normal for age atrophy. Electronically Signed   By: Keith Rake M.D.   On: 11/30/2020 21:53   CT Angio Chest PE W and/or Wo Contrast  Result Date: 11/30/2020 CLINICAL DATA:  Pain and fever. EXAM: CT  ANGIOGRAPHY CHEST CT ABDOMEN AND PELVIS WITH CONTRAST TECHNIQUE: Multidetector CT imaging of the chest was performed using the standard protocol during bolus administration of intravenous contrast. Multiplanar CT image reconstructions and MIPs were obtained to evaluate the vascular anatomy. Multidetector CT imaging of the abdomen and pelvis was performed using the standard protocol during bolus administration of intravenous contrast. CONTRAST:  186mL OMNIPAQUE IOHEXOL 350 MG/ML SOLN COMPARISON:  CT dated November 30, 2020 FINDINGS: CTA CHEST FINDINGS Cardiovascular: Contrast injection is sufficient to demonstrate satisfactory opacification of the pulmonary arteries to the segmental level. There is no pulmonary embolus or evidence of right heart strain. The size of the main pulmonary artery is normal. Heart size is normal, with no pericardial effusion. The course and caliber of the aorta are normal. There is mild atherosclerotic calcification. Opacification decreased due to pulmonary arterial phase contrast bolus timing. Mediastinum/Nodes: -- No mediastinal lymphadenopathy. -- No hilar lymphadenopathy. -- No axillary lymphadenopathy. -- No supraclavicular lymphadenopathy. -- Normal thyroid gland where visualized. -  Unremarkable esophagus. Lungs/Pleura: Airways are patent. No pleural effusion, lobar consolidation, pneumothorax or pulmonary infarction. Musculoskeletal: There are old left-sided rib fractures. No acute displaced fracture. Review of the MIP images confirms the above findings. CT ABDOMEN and PELVIS FINDINGS Hepatobiliary: The liver is normal. Normal gallbladder.There is no biliary ductal dilation. Pancreas: Normal contours without ductal dilatation. No peripancreatic fluid collection. Spleen: Unremarkable. Adrenals/Urinary Tract: --Adrenal glands: Unremarkable. --Right kidney/ureter: There is a nonobstructing 4 mm stone in the interpolar region of the right kidney. --Left kidney/ureter: No hydronephrosis  or radiopaque kidney stones. --Urinary bladder: The urinary bladder is distended. Stomach/Bowel: --Stomach/Duodenum: No hiatal hernia or other gastric abnormality. Normal duodenal course and caliber. --Small bowel: Unremarkable. --Colon: Unremarkable. --Appendix: Normal. Vascular/Lymphatic: Atherosclerotic calcification is present within the non-aneurysmal abdominal aorta, without hemodynamically significant stenosis. --No retroperitoneal lymphadenopathy. --No mesenteric lymphadenopathy. --No pelvic or inguinal lymphadenopathy. Reproductive: Status post hysterectomy. No adnexal mass. Other: No ascites or free air. The abdominal wall is normal. Musculoskeletal. Compression fractures are again noted of the lumbar spine, not substantially changed from prior study. Review of the MIP images confirms the above findings. IMPRESSION: 1. No acute abnormality. 2. Nonobstructing right nephrolithiasis. 3. Distended urinary bladder. Aortic Atherosclerosis (ICD10-I70.0). Electronically Signed   By: Constance Holster M.D.   On: 11/30/2020 21:56   CT ABDOMEN PELVIS W CONTRAST  Result Date: 11/30/2020 CLINICAL DATA:  Pain and fever. EXAM: CT ANGIOGRAPHY CHEST  CT ABDOMEN AND PELVIS WITH CONTRAST TECHNIQUE: Multidetector CT imaging of the chest was performed using the standard protocol during bolus administration of intravenous contrast. Multiplanar CT image reconstructions and MIPs were obtained to evaluate the vascular anatomy. Multidetector CT imaging of the abdomen and pelvis was performed using the standard protocol during bolus administration of intravenous contrast. CONTRAST:  100mL OMNIPAQUE IOHEXOL 350 MG/ML SOLN COMPARISON:  CT dated November 30, 2020 FINDINGS: CTA CHEST FINDINGS Cardiovascular: Contrast injection is sufficient to demonstrate satisfactory opacification of the pulmonary arteries to the segmental level. There is no pulmonary embolus or evidence of right heart strain. The size of the main pulmonary artery  is normal. Heart size is normal, with no pericardial effusion. The course and caliber of the aorta are normal. There is mild atherosclerotic calcification. Opacification decreased due to pulmonary arterial phase contrast bolus timing. Mediastinum/Nodes: -- No mediastinal lymphadenopathy. -- No hilar lymphadenopathy. -- No axillary lymphadenopathy. -- No supraclavicular lymphadenopathy. -- Normal thyroid gland where visualized. -  Unremarkable esophagus. Lungs/Pleura: Airways are patent. No pleural effusion, lobar consolidation, pneumothorax or pulmonary infarction. Musculoskeletal: There are old left-sided rib fractures. No acute displaced fracture. Review of the MIP images confirms the above findings. CT ABDOMEN and PELVIS FINDINGS Hepatobiliary: The liver is normal. Normal gallbladder.There is no biliary ductal dilation. Pancreas: Normal contours without ductal dilatation. No peripancreatic fluid collection. Spleen: Unremarkable. Adrenals/Urinary Tract: --Adrenal glands: Unremarkable. --Right kidney/ureter: There is a nonobstructing 4 mm stone in the interpolar region of the right kidney. --Left kidney/ureter: No hydronephrosis or radiopaque kidney stones. --Urinary bladder: The urinary bladder is distended. Stomach/Bowel: --Stomach/Duodenum: No hiatal hernia or other gastric abnormality. Normal duodenal course and caliber. --Small bowel: Unremarkable. --Colon: Unremarkable. --Appendix: Normal. Vascular/Lymphatic: Atherosclerotic calcification is present within the non-aneurysmal abdominal aorta, without hemodynamically significant stenosis. --No retroperitoneal lymphadenopathy. --No mesenteric lymphadenopathy. --No pelvic or inguinal lymphadenopathy. Reproductive: Status post hysterectomy. No adnexal mass. Other: No ascites or free air. The abdominal wall is normal. Musculoskeletal. Compression fractures are again noted of the lumbar spine, not substantially changed from prior study. Review of the MIP images  confirms the above findings. IMPRESSION: 1. No acute abnormality. 2. Nonobstructing right nephrolithiasis. 3. Distended urinary bladder. Aortic Atherosclerosis (ICD10-I70.0). Electronically Signed   By: Katherine Mantlehristopher  Green M.D.   On: 11/30/2020 21:56   DG Chest Port 1 View  Result Date: 11/30/2020 CLINICAL DATA:  Fever, hypokalemia, altered mental status EXAM: PORTABLE CHEST 1 VIEW COMPARISON:  CT 02/09/2012, radiograph 02/09/2012 FINDINGS: Low lung volumes with some hazy opacities atelectasis or pulmonary edema given the presence of pulmonary vascular congestion and fissural thickening. Prominence of the cardiomediastinal silhouette may be accentuated by the portable technique and/or low volumes. The aorta is calcified. The remaining cardiomediastinal contours are unremarkable. No acute osseous or soft tissue abnormality. Degenerative changes are present in the imaged spine and shoulders. IMPRESSION: Low lung volumes with hazy opacities likely reflect atelectasis or early edema given pulmonary vascular congestion. Underlying infection difficult to exclude in the setting of the fever. Electronically Signed   By: Kreg ShropshirePrice  DeHay M.D.   On: 11/30/2020 19:22   CT Renal Stone Study  Result Date: 11/30/2020 CLINICAL DATA:  Flank pain EXAM: CT ABDOMEN AND PELVIS WITHOUT CONTRAST TECHNIQUE: Multidetector CT imaging of the abdomen and pelvis was performed following the standard protocol without IV contrast. COMPARISON:  CT abdomen dated 01/17/2019 FINDINGS: Lower chest: No acute abnormality. Hepatobiliary: No focal liver abnormality is seen. Gallbladder is unremarkable. No bile duct dilatation. Pancreas: Unremarkable. No pancreatic ductal  dilatation or surrounding inflammatory changes. Spleen: Normal in size without focal abnormality. Adrenals/Urinary Tract: Adrenal glands are unremarkable. 4 mm RIGHT renal stone. Kidneys otherwise unremarkable without hydronephrosis or perinephric inflammation. No ureteral or bladder  calculi. Bladder is unremarkable. Stomach/Bowel: No dilated large or small bowel loops. No evidence of bowel wall inflammation. Appendix is not seen but there are no inflammatory changes about the cecum to suggest acute appendicitis. Stomach is unremarkable, partially decompressed. Vascular/Lymphatic: Aortic atherosclerosis. No enlarged lymph nodes are seen within the abdomen or pelvis. Reproductive: Presumed hysterectomy.  No adnexal mass or free fluid. Other: No free fluid or abscess collection. No free intraperitoneal air. Musculoskeletal: No acute-appearing osseous abnormality. Stable chronic compression fracture deformities of the L1, L3 and L5 vertebral bodies. IMPRESSION: 1. No acute findings within the abdomen or pelvis. No bowel obstruction or evidence of bowel wall inflammation. No evidence of acute solid organ abnormality. No free fluid or inflammatory change. 2. 4 mm nonobstructing RIGHT renal stone. No ureteral or bladder calculi. 3. Chronic compression fracture deformities of the L1, L3 and L5 vertebral bodies, not significantly changed compared to earlier CT of 01/17/2019. Aortic Atherosclerosis (ICD10-I70.0). Electronically Signed   By: Franki Cabot M.D.   On: 11/30/2020 12:42   Recent Results (from the past 240 hour(s))  Urine culture     Status: None   Collection Time: 11/30/20  1:44 PM   Specimen: Urine, Random  Result Value Ref Range Status   Specimen Description   Final    URINE, RANDOM Performed at [facility] [address] [place], [place] [address]    Special Requests   Final    NONE Performed at [facility], [place] [address] [place], [place] [address]    Culture   Final    NO GROWTH Performed at [facility], [place] [address] [place], [place] [address]    Report Status 12/01/2020 FINAL  Final  Culture, blood (routine x 2)     Status: None (Preliminary result)   Collection Time: 11/30/20  7:25 PM   Specimen: BLOOD  Result  Value Ref Range Status   Specimen Description   Final    BLOOD BLOOD LEFT FOREARM Performed at [facility] [address] [place], [place] [address]    Special Requests   Final    BOTTLES DRAWN AEROBIC AND ANAEROBIC Blood Culture results may not be optimal due to an inadequate volume of blood received in culture bottles   Culture  Setup Time   Final    GRAM POSITIVE COCCI IN CLUSTERS IN BOTH AEROBIC AND ANAEROBIC BOTTLES Organism ID to follow CRITICAL RESULT CALLED TO, READ BACK BY AND VERIFIED WITH: Melodye Ped PharmD 13:20 12/01/20 ([email or web address]) Performed at [facility], [place] [address], [place], [place] [address]    Culture GRAM POSITIVE COCCI  Final   Report Status PENDING  Incomplete  SARS Coronavirus 2 by RT PCR (hospital order, performed in [facility] hospital lab) Nasopharyngeal Nasopharyngeal Swab     Status: None   Collection Time: 11/30/20  7:25 PM   Specimen: Nasopharyngeal Swab  Result Value Ref Range Status   SARS Coronavirus 2 NEGATIVE NEGATIVE Final    Comment: (NOTE) SARS-CoV-2 target nucleic acids are NOT DETECTED.  The SARS-CoV-2 RNA is generally detectable in upper and lower respiratory specimens during the acute phase of infection. The lowest concentration of SARS-CoV-2 viral copies this assay can detect is 250 copies / mL. A negative result  does not preclude SARS-CoV-2 infection and should not be used as the sole basis for treatment or other patient management decisions.  A negative result may occur with improper specimen collection / handling, submission of specimen other than nasopharyngeal swab, presence of viral mutation(s) within the areas targeted by this assay, and inadequate number of viral copies (<250 copies / mL). A negative result must be combined with clinical observations, patient history, and epidemiological information.  Fact Sheet for Patients:   StrictlyIdeas.no  Fact Sheet for Healthcare  Providers: BankingDealers.co.za  This test is not yet approved or  cleared by the [place] FDA and has been authorized for detection and/or diagnosis of SARS-CoV-2 by FDA under an Emergency Use Authorization (EUA).  This EUA will remain in effect (meaning this test can be used) for the duration of the COVID-19 declaration under Section 564(b)(1) of the Act, 21 U.S.C. section 360bbb-3(b)(1), unless the authorization is terminated or revoked sooner.  Performed at [facility], [place] [address] [place], [place] [address]   Blood Culture ID Panel (Reflexed)     Status: Abnormal   Collection Time: 11/30/20  7:25 PM  Result Value Ref Range Status   Enterococcus faecalis NOT DETECTED NOT DETECTED Final   Enterococcus Faecium NOT DETECTED NOT DETECTED Final   Listeria monocytogenes NOT DETECTED NOT DETECTED Final   Staphylococcus species DETECTED (A) NOT DETECTED Final    Comment: CRITICAL RESULT CALLED TO, READ BACK BY AND VERIFIED WITH: Melodye Ped PharmD 13:20 12/01/20 ([email or web address])    Staphylococcus aureus (BCID) DETECTED (A) NOT DETECTED Final    Comment: CRITICAL RESULT CALLED TO, READ BACK BY AND VERIFIED WITH: Melodye Ped PharmD 13:20 12/01/20 ([email or web address])    Staphylococcus epidermidis NOT DETECTED NOT DETECTED Final   Staphylococcus lugdunensis NOT DETECTED NOT DETECTED Final   Streptococcus species NOT DETECTED NOT DETECTED Final   Streptococcus agalactiae NOT DETECTED NOT DETECTED Final   Streptococcus pneumoniae NOT DETECTED NOT DETECTED Final   Streptococcus pyogenes NOT DETECTED NOT DETECTED Final   A.calcoaceticus-baumannii NOT DETECTED NOT DETECTED Final   Bacteroides fragilis NOT DETECTED NOT DETECTED Final   Enterobacterales NOT DETECTED NOT DETECTED Final   Enterobacter cloacae complex NOT DETECTED NOT DETECTED Final   Escherichia coli NOT DETECTED NOT DETECTED Final   Klebsiella aerogenes NOT DETECTED NOT DETECTED Final   Klebsiella  oxytoca NOT DETECTED NOT DETECTED Final   Klebsiella pneumoniae NOT DETECTED NOT DETECTED Final   Proteus species NOT DETECTED NOT DETECTED Final   Salmonella species NOT DETECTED NOT DETECTED Final   Serratia marcescens NOT DETECTED NOT DETECTED Final   Haemophilus influenzae NOT DETECTED NOT DETECTED Final   Neisseria meningitidis NOT DETECTED NOT DETECTED Final   Pseudomonas aeruginosa NOT DETECTED NOT DETECTED Final   Stenotrophomonas maltophilia NOT DETECTED NOT DETECTED Final   Candida albicans NOT DETECTED NOT DETECTED Final   Candida auris NOT DETECTED NOT DETECTED Final   Candida glabrata NOT DETECTED NOT DETECTED Final   Candida krusei NOT DETECTED NOT DETECTED Final   Candida parapsilosis NOT DETECTED NOT DETECTED Final   Candida tropicalis NOT DETECTED NOT DETECTED Final   Cryptococcus neoformans/gattii NOT DETECTED NOT DETECTED Final   Meth resistant mecA/C and MREJ NOT DETECTED NOT DETECTED Final    Comment: Performed at [facility], [address], [place], [place] [address]  Culture, blood (Routine X 2) w Reflex to ID Panel     Status: None (Preliminary result)   Collection Time: 11/30/20  8:06 PM  Specimen: BLOOD  Result Value Ref Range Status   Specimen Description   Final    BLOOD BLOOD LEFT HAND Performed at [place] [address], [place], [place] [address]    Special Requests   Final    BOTTLES DRAWN AEROBIC AND ANAEROBIC Blood Culture results may not be optimal due to an inadequate volume of blood received in culture bottles Performed at [facility] [address] [place], [place] [address]    Culture  Setup Time   Final    GRAM POSITIVE COCCI IN CLUSTERS IN BOTH AEROBIC AND ANAEROBIC BOTTLES CRITICAL VALUE NOTED.  VALUE IS CONSISTENT WITH PREVIOUSLY REPORTED AND CALLED VALUE. Performed at [facility], [place] [address], [place], [place] [address]    Culture GRAM POSITIVE COCCI  Final   Report  Status PENDING  Incomplete    Microbiology: Recent Results (from the past 240 hour(s))  Urine culture     Status: None   Collection Time: 11/30/20  1:44 PM   Specimen: Urine, Random  Result Value Ref Range Status   Specimen Description   Final    URINE, RANDOM Performed at [facility] [address], [place], [place] [address]    Special Requests   Final    NONE Performed at [facility], [place] [address], [place], [place] [address]    Culture   Final    NO GROWTH Performed at [facility], [place] [address], [place], [place] [address]    Report Status 12/01/2020 FINAL  Final  Culture, blood (routine x 2)     Status: None (Preliminary result)   Collection Time: 11/30/20  7:25 PM   Specimen: BLOOD  Result Value Ref Range Status   Specimen Description   Final    BLOOD BLOOD LEFT FOREARM Performed at [facility] [address], [place], [place] [address]    Special Requests   Final    BOTTLES DRAWN AEROBIC AND ANAEROBIC Blood Culture results may not be optimal due to an inadequate volume of blood received in culture bottles   Culture  Setup Time   Final    GRAM POSITIVE COCCI IN CLUSTERS IN BOTH AEROBIC AND ANAEROBIC BOTTLES Organism ID to follow CRITICAL RESULT CALLED TO, READ BACK BY AND VERIFIED WITH: Melodye Ped PharmD 13:20 12/01/20 ([email or web address]) Performed at [facility], [place] [address], [place],  [address]    Culture GRAM POSITIVE COCCI  Final   Report Status PENDING  Incomplete  SARS Coronavirus 2 by RT PCR (hospital order, performed in [facility] hospital lab) Nasopharyngeal Nasopharyngeal Swab     Status: None   Collection Time: 11/30/20  7:25 PM   Specimen: Nasopharyngeal Swab  Result Value Ref Range Status   SARS Coronavirus 2 NEGATIVE NEGATIVE Final    Comment: (NOTE) SARS-CoV-2 target nucleic acids are NOT DETECTED.  The SARS-CoV-2 RNA is generally detectable in upper and  lower respiratory specimens during the acute phase of infection. The lowest concentration of SARS-CoV-2 viral copies this assay can detect is 250 copies / mL. A negative result does not preclude SARS-CoV-2 infection and should not be used as the sole basis for treatment or other patient management decisions.  A negative result may occur with improper specimen collection / handling, submission of specimen other than nasopharyngeal swab, presence of viral mutation(s) within the areas targeted by this assay, and inadequate number of viral copies (<250 copies / mL).  A negative result must be combined with clinical observations, patient history, and epidemiological information.  Fact Sheet for Patients:   StrictlyIdeas.no  Fact Sheet for Healthcare Providers: BankingDealers.co.za  This test is not yet approved or  cleared by the [place] FDA and has been authorized for detection and/or diagnosis of SARS-CoV-2 by FDA under an Emergency Use Authorization (EUA).  This EUA will remain in effect (meaning this test can be used) for the duration of the COVID-19 declaration under Section 564(b)(1) of the Act, 21 U.S.C. section 360bbb-3(b)(1), unless the authorization is terminated or revoked sooner.  Performed at [facility], [place] [address] [place], [place] [address]   Blood Culture ID Panel (Reflexed)     Status: Abnormal   Collection Time: 11/30/20  7:25 PM  Result Value Ref Range Status   Enterococcus faecalis NOT DETECTED NOT DETECTED Final   Enterococcus Faecium NOT DETECTED NOT DETECTED Final   Listeria monocytogenes NOT DETECTED NOT DETECTED Final   Staphylococcus species DETECTED (A) NOT DETECTED Final    Comment: CRITICAL RESULT CALLED TO, READ BACK BY AND VERIFIED WITH: Melodye Ped PharmD 13:20 12/01/20 ([email or web address])    Staphylococcus aureus (BCID) DETECTED (A) NOT DETECTED Final    Comment: CRITICAL RESULT CALLED  TO, READ BACK BY AND VERIFIED WITH: Melodye Ped PharmD 13:20 12/01/20 ([email or web address])    Staphylococcus epidermidis NOT DETECTED NOT DETECTED Final   Staphylococcus lugdunensis NOT DETECTED NOT DETECTED Final   Streptococcus species NOT DETECTED NOT DETECTED Final   Streptococcus agalactiae NOT DETECTED NOT DETECTED Final   Streptococcus pneumoniae NOT DETECTED NOT DETECTED Final   Streptococcus pyogenes NOT DETECTED NOT DETECTED Final   A.calcoaceticus-baumannii NOT DETECTED NOT DETECTED Final   Bacteroides fragilis NOT DETECTED NOT DETECTED Final   Enterobacterales NOT DETECTED NOT DETECTED Final   Enterobacter cloacae complex NOT DETECTED NOT DETECTED Final   Escherichia coli NOT DETECTED NOT DETECTED Final   Klebsiella aerogenes NOT DETECTED NOT DETECTED Final   Klebsiella oxytoca NOT DETECTED NOT DETECTED Final   Klebsiella pneumoniae NOT DETECTED NOT DETECTED Final   Proteus species NOT DETECTED NOT DETECTED Final   Salmonella species NOT DETECTED NOT DETECTED Final   Serratia marcescens NOT DETECTED NOT DETECTED Final   Haemophilus influenzae NOT DETECTED NOT DETECTED Final   Neisseria meningitidis NOT DETECTED NOT DETECTED Final   Pseudomonas aeruginosa NOT DETECTED NOT DETECTED Final   Stenotrophomonas maltophilia NOT DETECTED NOT DETECTED Final   Candida albicans NOT DETECTED NOT DETECTED Final   Candida auris NOT DETECTED NOT DETECTED Final   Candida glabrata NOT DETECTED NOT DETECTED Final   Candida krusei NOT DETECTED NOT DETECTED Final   Candida parapsilosis NOT DETECTED NOT DETECTED Final   Candida tropicalis NOT DETECTED NOT DETECTED Final   Cryptococcus neoformans/gattii NOT DETECTED NOT DETECTED Final   Meth resistant mecA/C and MREJ NOT DETECTED NOT DETECTED Final    Comment: Performed at [facility], [address], [place], [place] [address]  Culture, blood (Routine X 2) w Reflex to ID Panel     Status: None (Preliminary result)   Collection Time: 11/30/20  8:06  PM   Specimen: BLOOD  Result Value Ref Range Status   Specimen Description   Final    BLOOD BLOOD LEFT HAND Performed at [facility] [address] [place], [place] [address]    Special Requests   Final    BOTTLES DRAWN AEROBIC AND ANAEROBIC Blood Culture results may not be optimal due to  an inadequate volume of blood received in culture bottles Performed at [facility] [address], [place], [place] [address]    Culture  Setup Time   Final    GRAM POSITIVE COCCI IN CLUSTERS IN BOTH AEROBIC AND ANAEROBIC BOTTLES CRITICAL VALUE NOTED.  VALUE IS CONSISTENT WITH PREVIOUSLY REPORTED AND CALLED VALUE. Performed at [facility], [place] [address] [place], [place] [address]    Culture GRAM POSITIVE COCCI  Final   Report Status PENDING  Incomplete    Radiographs and labs were personally reviewed by me.   Bobby Rumpf, MD [facility] [phone] 12/01/2020, 2:35 PM

## 2020-12-01 NOTE — Progress Notes (Signed)
PHARMACY - PHYSICIAN COMMUNICATION CRITICAL VALUE ALERT - BLOOD CULTURE IDENTIFICATION (BCID)  [name] is an 75 y.o. female who presented to [facility] on 11/30/2020 with a chief complaint of fever and confusion, sepsis  Assessment:  3 of 4 blood culture bottles growing MSSA  Name of physician (or Provider) Contacted: [place]  Current antibiotics: cefepime/vanc/flagyl  Changes to prescribed antibiotics recommended:  Ancef  Results for orders placed or performed during the hospital encounter of 11/30/20  Blood Culture ID Panel (Reflexed) (Collected: 11/30/2020  7:25 PM)  Result Value Ref Range   Enterococcus faecalis NOT DETECTED NOT DETECTED   Enterococcus Faecium NOT DETECTED NOT DETECTED   Listeria monocytogenes NOT DETECTED NOT DETECTED   Staphylococcus species DETECTED (A) NOT DETECTED   Staphylococcus aureus (BCID) DETECTED (A) NOT DETECTED   Staphylococcus epidermidis NOT DETECTED NOT DETECTED   Staphylococcus lugdunensis NOT DETECTED NOT DETECTED   Streptococcus species NOT DETECTED NOT DETECTED   Streptococcus agalactiae NOT DETECTED NOT DETECTED   Streptococcus pneumoniae NOT DETECTED NOT DETECTED   Streptococcus pyogenes NOT DETECTED NOT DETECTED   A.calcoaceticus-baumannii NOT DETECTED NOT DETECTED   Bacteroides fragilis NOT DETECTED NOT DETECTED   Enterobacterales NOT DETECTED NOT DETECTED   Enterobacter cloacae complex NOT DETECTED NOT DETECTED   Escherichia coli NOT DETECTED NOT DETECTED   Klebsiella aerogenes NOT DETECTED NOT DETECTED   Klebsiella oxytoca NOT DETECTED NOT DETECTED   Klebsiella pneumoniae NOT DETECTED NOT DETECTED   Proteus species NOT DETECTED NOT DETECTED   Salmonella species NOT DETECTED NOT DETECTED   Serratia marcescens NOT DETECTED NOT DETECTED   Haemophilus influenzae NOT DETECTED NOT DETECTED   Neisseria meningitidis NOT DETECTED NOT DETECTED   Pseudomonas aeruginosa NOT DETECTED NOT DETECTED   Stenotrophomonas maltophilia NOT  DETECTED NOT DETECTED   Candida albicans NOT DETECTED NOT DETECTED   Candida auris NOT DETECTED NOT DETECTED   Candida glabrata NOT DETECTED NOT DETECTED   Candida krusei NOT DETECTED NOT DETECTED   Candida parapsilosis NOT DETECTED NOT DETECTED   Candida tropicalis NOT DETECTED NOT DETECTED   Cryptococcus neoformans/gattii NOT DETECTED NOT DETECTED   Meth resistant mecA/C and MREJ NOT DETECTED NOT DETECTED    [name] 12/01/2020  1:45 PM

## 2020-12-01 NOTE — Progress Notes (Addendum)
PROGRESS NOTE  [name] [id number] DOB: 1946/06/01 DOA: 11/30/2020 PCP: [name], MD   LOS: 1 day   Brief narrative:  [name] is a 75 y.o. female with past medical history of hypothyroidism and depression presented to the hospital with cough and not feeling well for the last 2 weeks.  Patient had presented to the ED a day before this admission for dysuria and a CT scan of the abdomen was done which showed nothing acute.  He was then discharged home but during the evening patient was noted to be more febrile and confused and was brought into the back to the hospital.    In the ED, patient was noted to have temperature 103 F, tachycardic with elevated lactic acid levels features concerning for sepsis.  CT angiogram of the chest, abdomen pelvis did not show anything acute.  CT renal study done during earlier visit showed nonobstructing stones and did show chronic compression fractures of the lumbar spine change from previous.  CT head was unremarkable.  Covid test was negative.  Blood cultures were drawn and started on sepsis protocol antibiotics were started. Lactic acid improved. procalcitonin was 1.15.  She was then admitted to hospital for sepsis.  Assessment/Plan:  Principal Problem:   SIRS (systemic inflammatory response syndrome) (HCC) Active Problems:   Malignant neoplasm of anal canal (HCC)  MSSA bacteremia, possible sepsis.  obvious source not clear at this time. 3/4 bottles positive.  Patient did have tachycardia fever elevated lactic acid on presentation.  Lactate of 3.6.  Pharmacy on board.  Patient was initially started on vancomycin.  Patient initially received IV fluid bolus in the ED.  Will change to cefazolin since blood culture shows MSSA.  ID consultation will be made as per protocol.  Mild leukocytosis, fever on presentation.  Urine culture negative support.  UA was negative.  CT scan of the chest and abdomen was negative as well.  Acute metabolic  encephalopathy secondary to infection.  We will continue to treat and provide supportive care.  History of depression on Abilify and Cymbalta.    Anemia appears to be chronic.  Closely monitor hemoglobin.  Hypothyroidism on Synthroid.  History of anal cancer stage IIIa.  Followed by oncology as outpatient.  History of radiation treatment.  Elevated blood pressure.  Not on antihypertensive medications at home.  Will closely monitor.  As needed antihypertensives as needed.  Hypokalemia.   We will continue to replenish.  Check levels in a.m. potassium level of 3.3  Neuropathy.   Continue gabapentin   DVT prophylaxis: enoxaparin (LOVENOX) injection 40 mg Start: 12/01/20 1000   Code Status: Full code  Family Communication: I tried to reach the patient's daughter Ms April on the phone but was unable to reach her x2  Status is: Inpatient  Remains inpatient appropriate because:IV treatments appropriate due to intensity of illness or inability to take PO and Inpatient level of care appropriate due to severity of illness   Dispo: The patient is from: Home              Anticipated d/c is to: Home              Anticipated d/c date is: 2 days              Patient currently is not medically stable to d/c.   Difficult to place patient No  Consultants:  ID   Procedures:  None  Anti-infectives:  . Cefazolin 12/01/2020>  Anti-infectives (From admission,  onward)   Start     Dose/Rate Route Frequency Ordered Stop   12/01/20 2200  ceFAZolin (ANCEF) IVPB 2g/100 mL premix        2 g 200 mL/hr over 30 Minutes Intravenous Every 8 hours 12/01/20 1347     12/01/20 2000  vancomycin (VANCOREADY) IVPB 1250 mg/250 mL  Status:  Discontinued        1,250 mg 166.7 mL/hr over 90 Minutes Intravenous Every 24 hours 12/01/20 0005 12/01/20 1347   12/01/20 0400  ceFEPIme (MAXIPIME) 2 g in sodium chloride 0.9 % 100 mL IVPB  Status:  Discontinued        2 g 200 mL/hr over 30 Minutes Intravenous Every 8  hours 2020/12/26 2230 12/01/20 1347   12/26/20 2245  metroNIDAZOLE (FLAGYL) IVPB 500 mg  Status:  Discontinued        500 mg 100 mL/hr over 60 Minutes Intravenous Every 8 hours 12-26-20 2230 12/01/20 1347   12/26/2020 2245  vancomycin (VANCOCIN) IVPB 1000 mg/200 mL premix  Status:  Discontinued        1,000 mg 200 mL/hr over 60 Minutes Intravenous  Once 12/26/2020 2230 December 26, 2020 2237   2020/12/26 1900  vancomycin (VANCOREADY) IVPB 1750 mg/350 mL        1,750 mg 175 mL/hr over 120 Minutes Intravenous STAT December 26, 2020 1850 26-Dec-2020 2231   2020/12/26 1900  ceFEPIme (MAXIPIME) 2 g in sodium chloride 0.9 % 100 mL IVPB        2 g 200 mL/hr over 30 Minutes Intravenous STAT 2020/12/26 1850 2020/12/26 2023     Subjective: Today, patient was seen and examined at bedside.  Denies any pain, nausea or vomiting.  Has mild cough.  Appears to be little confused  Objective: Vitals:   12/01/20 1215 12/01/20 1230  BP: (!) 146/75 (!) 122/104  Pulse: 98 (!) 104  Resp:  18  Temp:    SpO2: 97% 100%    Intake/Output Summary (Last 24 hours) at 12/01/2020 1412 Last data filed at 12/01/2020 9417 Gross per 24 hour  Intake 2242.16 ml  Output -  Net 2242.16 ml   There were no vitals filed for this visit. There is no height or weight on file to calculate BMI.   Physical Exam: GENERAL: Patient is alert awake but confused, communicative not in obvious distress. HENT: No scleral pallor or icterus. Pupils equally reactive to light. Oral mucosa is moist NECK: is supple, no gross swelling noted. CHEST: Clear to auscultation. No crackles or wheezes.  Diminished breath sounds bilaterally. CVS: S1 and S2 heard, no murmur. Regular rate and rhythm.  ABDOMEN: Soft, non-tender, bowel sounds are present. EXTREMITIES: No edema. CNS: Confused, alert awake, communicative, moving all extremities, SKIN: warm and dry without rashes.  Data Review: I have personally reviewed the following laboratory data and studies,  CBC: Recent Labs   Lab 12/26/20 0951 December 26, 2020 1925 2020-12-26 2225 12/01/20 0255  WBC 12.3* 10.6* 8.5 9.1  NEUTROABS 10.9* 9.8*  --  8.2*  HGB 10.5* 12.6 9.9* 10.6*  HCT 31.3* 38.2 30.8* 32.7*  MCV 97.8 99.7 100.7* 100.9*  PLT 171 188 146* 408   Basic Metabolic Panel: Recent Labs  Lab December 26, 2020 0951 2020-12-26 1925 2020/12/26 2225 12/01/20 0255  NA 134* 139  --  137  K 2.8* 3.3*  --  3.4*  CL 100 102  --  107  CO2 21* 22  --  21*  GLUCOSE 149* 107*  --  108*  BUN 14 14  --  13  CREATININE 0.73 0.71 0.62 0.74  CALCIUM 8.7* 9.5  --  8.3*  MG 1.7  --   --   --    Liver Function Tests: Recent Labs  Lab 11/30/20 0951 12/01/20 0255  AST 35 31  ALT 22 25  ALKPHOS 55 55  BILITOT 0.9 0.7  PROT 6.5 6.3*  ALBUMIN 3.7 3.3*   No results for input(s): LIPASE, AMYLASE in the last 168 hours. No results for input(s): AMMONIA in the last 168 hours. Cardiac Enzymes: No results for input(s): CKTOTAL, CKMB, CKMBINDEX, TROPONINI in the last 168 hours. BNP (last 3 results) No results for input(s): BNP in the last 8760 hours.  ProBNP (last 3 results) No results for input(s): PROBNP in the last 8760 hours.  CBG: No results for input(s): GLUCAP in the last 168 hours. Recent Results (from the past 240 hour(s))  Urine culture     Status: None   Collection Time: 11/30/20  1:44 PM   Specimen: Urine, Random  Result Value Ref Range Status   Specimen Description   Final    URINE, RANDOM Performed at [facility] [address] [place], [place] [address]    Special Requests   Final    NONE Performed at [facility], [place] [address] [place], [place] [address]    Culture   Final    NO GROWTH Performed at [facility], [place] [address] [place], [place] [address]    Report Status 12/01/2020 FINAL  Final  Culture, blood (routine x 2)     Status: None (Preliminary result)   Collection Time: 11/30/20  7:25 PM   Specimen: BLOOD  Result Value Ref Range Status    Specimen Description   Final    BLOOD BLOOD LEFT FOREARM Performed at [facility] [address] [place], [place] [address]    Special Requests   Final    BOTTLES DRAWN AEROBIC AND ANAEROBIC Blood Culture results may not be optimal due to an inadequate volume of blood received in culture bottles   Culture  Setup Time   Final    GRAM POSITIVE COCCI IN CLUSTERS IN BOTH AEROBIC AND ANAEROBIC BOTTLES Organism ID to follow CRITICAL RESULT CALLED TO, READ BACK BY AND VERIFIED WITH: Melodye Ped PharmD 13:20 12/01/20 ([email or web address]) Performed at [facility], [place] [address] [place], [place] [address]    Culture GRAM POSITIVE COCCI  Final   Report Status PENDING  Incomplete  SARS Coronavirus 2 by RT PCR (hospital order, performed in [facility] hospital lab) Nasopharyngeal Nasopharyngeal Swab     Status: None   Collection Time: 11/30/20  7:25 PM   Specimen: Nasopharyngeal Swab  Result Value Ref Range Status   SARS Coronavirus 2 NEGATIVE NEGATIVE Final    Comment: (NOTE) SARS-CoV-2 target nucleic acids are NOT DETECTED.  The SARS-CoV-2 RNA is generally detectable in upper and lower respiratory specimens during the acute phase of infection. The lowest concentration of SARS-CoV-2 viral copies this assay can detect is 250 copies / mL. A negative result does not preclude SARS-CoV-2 infection and should not be used as the sole basis for treatment or other patient management decisions.  A negative result may occur with improper specimen collection / handling, submission of specimen other than nasopharyngeal swab, presence of viral mutation(s) within the areas targeted by this assay, and inadequate number of viral copies (<250 copies / mL). A negative result must be combined with clinical observations, patient  history, and epidemiological information.  Fact Sheet for Patients:   StrictlyIdeas.no  Fact Sheet for Healthcare  Providers: BankingDealers.co.za  This test is not yet approved or  cleared by the [place] FDA and has been authorized for detection and/or diagnosis of SARS-CoV-2 by FDA under an Emergency Use Authorization (EUA).  This EUA will remain in effect (meaning this test can be used) for the duration of the COVID-19 declaration under Section 564(b)(1) of the Act, 21 U.S.C. section 360bbb-3(b)(1), unless the authorization is terminated or revoked sooner.  Performed at [facility], [place] [address], [place], [place] [address]   Blood Culture ID Panel (Reflexed)     Status: Abnormal   Collection Time: 11/30/20  7:25 PM  Result Value Ref Range Status   Enterococcus faecalis NOT DETECTED NOT DETECTED Final   Enterococcus Faecium NOT DETECTED NOT DETECTED Final   Listeria monocytogenes NOT DETECTED NOT DETECTED Final   Staphylococcus species DETECTED (A) NOT DETECTED Final    Comment: CRITICAL RESULT CALLED TO, READ BACK BY AND VERIFIED WITH: Melodye Ped PharmD 13:20 12/01/20 ([email or web address])    Staphylococcus aureus (BCID) DETECTED (A) NOT DETECTED Final    Comment: CRITICAL RESULT CALLED TO, READ BACK BY AND VERIFIED WITH: Melodye Ped PharmD 13:20 12/01/20 ([email or web address])    Staphylococcus epidermidis NOT DETECTED NOT DETECTED Final   Staphylococcus lugdunensis NOT DETECTED NOT DETECTED Final   Streptococcus species NOT DETECTED NOT DETECTED Final   Streptococcus agalactiae NOT DETECTED NOT DETECTED Final   Streptococcus pneumoniae NOT DETECTED NOT DETECTED Final   Streptococcus pyogenes NOT DETECTED NOT DETECTED Final   A.calcoaceticus-baumannii NOT DETECTED NOT DETECTED Final   Bacteroides fragilis NOT DETECTED NOT DETECTED Final   Enterobacterales NOT DETECTED NOT DETECTED Final   Enterobacter cloacae complex NOT DETECTED NOT DETECTED Final   Escherichia coli NOT DETECTED NOT DETECTED Final   Klebsiella aerogenes NOT DETECTED NOT DETECTED Final   Klebsiella  oxytoca NOT DETECTED NOT DETECTED Final   Klebsiella pneumoniae NOT DETECTED NOT DETECTED Final   Proteus species NOT DETECTED NOT DETECTED Final   Salmonella species NOT DETECTED NOT DETECTED Final   Serratia marcescens NOT DETECTED NOT DETECTED Final   Haemophilus influenzae NOT DETECTED NOT DETECTED Final   Neisseria meningitidis NOT DETECTED NOT DETECTED Final   Pseudomonas aeruginosa NOT DETECTED NOT DETECTED Final   Stenotrophomonas maltophilia NOT DETECTED NOT DETECTED Final   Candida albicans NOT DETECTED NOT DETECTED Final   Candida auris NOT DETECTED NOT DETECTED Final   Candida glabrata NOT DETECTED NOT DETECTED Final   Candida krusei NOT DETECTED NOT DETECTED Final   Candida parapsilosis NOT DETECTED NOT DETECTED Final   Candida tropicalis NOT DETECTED NOT DETECTED Final   Cryptococcus neoformans/gattii NOT DETECTED NOT DETECTED Final   Meth resistant mecA/C and MREJ NOT DETECTED NOT DETECTED Final    Comment: Performed at [facility], [address], [place], [place] [address]  Culture, blood (Routine X 2) w Reflex to ID Panel     Status: None (Preliminary result)   Collection Time: 11/30/20  8:06 PM   Specimen: BLOOD  Result Value Ref Range Status   Specimen Description   Final    BLOOD BLOOD LEFT HAND Performed at [facility] [address], [place], [place] [address]    Special Requests   Final    BOTTLES DRAWN AEROBIC AND ANAEROBIC Blood Culture results may not be optimal due to an inadequate volume of blood received in culture bottles Performed  at [facility], [place] [address], [place], [place] [address]    Culture  Setup Time   Final    GRAM POSITIVE COCCI IN CLUSTERS IN BOTH AEROBIC AND ANAEROBIC BOTTLES CRITICAL VALUE NOTED.  VALUE IS CONSISTENT WITH PREVIOUSLY REPORTED AND CALLED VALUE. Performed at [facility], [place] [address] [place], [place] [address]    Culture [facility] POSITIVE COCCI  Final   Report  Status PENDING  Incomplete     Studies: CT Head Wo Contrast  Result Date: 11/30/2020 CLINICAL DATA:  Delirium.  Discharged today. EXAM: CT HEAD WITHOUT CONTRAST TECHNIQUE: Contiguous axial images were obtained from the base of the skull through the vertex without intravenous contrast. COMPARISON:  None. FINDINGS: Brain: Normal for age atrophy. No intracranial hemorrhage, mass effect, or midline shift. No hydrocephalus. The basilar cisterns are patent. No evidence of territorial infarct or acute ischemia. No extra-axial or intracranial fluid collection. Vascular: Atherosclerosis of skullbase vasculature without hyperdense vessel or abnormal calcification. Skull: No fracture or focal lesion. Sinuses/Orbits: Minor mucosal thickening of ethmoid air cells. No sinus air-fluid levels. Mastoid air cells are clear. Orbits are unremarkable. Other: None. IMPRESSION: 1. No acute intracranial abnormality. 2. Normal for age atrophy. Electronically Signed   By: Keith Rake M.D.   On: 11/30/2020 21:53   CT Angio Chest PE W and/or Wo Contrast  Result Date: 11/30/2020 CLINICAL DATA:  Pain and fever. EXAM: CT ANGIOGRAPHY CHEST CT ABDOMEN AND PELVIS WITH CONTRAST TECHNIQUE: Multidetector CT imaging of the chest was performed using the standard protocol during bolus administration of intravenous contrast. Multiplanar CT image reconstructions and MIPs were obtained to evaluate the vascular anatomy. Multidetector CT imaging of the abdomen and pelvis was performed using the standard protocol during bolus administration of intravenous contrast. CONTRAST:  194mL OMNIPAQUE IOHEXOL 350 MG/ML SOLN COMPARISON:  CT dated November 30, 2020 FINDINGS: CTA CHEST FINDINGS Cardiovascular: Contrast injection is sufficient to demonstrate satisfactory opacification of the pulmonary arteries to the segmental level. There is no pulmonary embolus or evidence of right heart strain. The size of the main pulmonary artery is normal. Heart size is  normal, with no pericardial effusion. The course and caliber of the aorta are normal. There is mild atherosclerotic calcification. Opacification decreased due to pulmonary arterial phase contrast bolus timing. Mediastinum/Nodes: -- No mediastinal lymphadenopathy. -- No hilar lymphadenopathy. -- No axillary lymphadenopathy. -- No supraclavicular lymphadenopathy. -- Normal thyroid gland where visualized. -  Unremarkable esophagus. Lungs/Pleura: Airways are patent. No pleural effusion, lobar consolidation, pneumothorax or pulmonary infarction. Musculoskeletal: There are old left-sided rib fractures. No acute displaced fracture. Review of the MIP images confirms the above findings. CT ABDOMEN and PELVIS FINDINGS Hepatobiliary: The liver is normal. Normal gallbladder.There is no biliary ductal dilation. Pancreas: Normal contours without ductal dilatation. No peripancreatic fluid collection. Spleen: Unremarkable. Adrenals/Urinary Tract: --Adrenal glands: Unremarkable. --Right kidney/ureter: There is a nonobstructing 4 mm stone in the interpolar region of the right kidney. --Left kidney/ureter: No hydronephrosis or radiopaque kidney stones. --Urinary bladder: The urinary bladder is distended. Stomach/Bowel: --Stomach/Duodenum: No hiatal hernia or other gastric abnormality. Normal duodenal course and caliber. --Small bowel: Unremarkable. --Colon: Unremarkable. --Appendix: Normal. Vascular/Lymphatic: Atherosclerotic calcification is present within the non-aneurysmal abdominal aorta, without hemodynamically significant stenosis. --No retroperitoneal lymphadenopathy. --No mesenteric lymphadenopathy. --No pelvic or inguinal lymphadenopathy. Reproductive: Status post hysterectomy. No adnexal mass. Other: No ascites or free air. The abdominal wall is normal. Musculoskeletal. Compression fractures are again noted of the lumbar spine, not substantially changed from prior  study. Review of the MIP images confirms the above findings.  IMPRESSION: 1. No acute abnormality. 2. Nonobstructing right nephrolithiasis. 3. Distended urinary bladder. Aortic Atherosclerosis (ICD10-I70.0). Electronically Signed   By: Constance Holster M.D.   On: 11/30/2020 21:56   CT ABDOMEN PELVIS W CONTRAST  Result Date: 11/30/2020 CLINICAL DATA:  Pain and fever. EXAM: CT ANGIOGRAPHY CHEST CT ABDOMEN AND PELVIS WITH CONTRAST TECHNIQUE: Multidetector CT imaging of the chest was performed using the standard protocol during bolus administration of intravenous contrast. Multiplanar CT image reconstructions and MIPs were obtained to evaluate the vascular anatomy. Multidetector CT imaging of the abdomen and pelvis was performed using the standard protocol during bolus administration of intravenous contrast. CONTRAST:  132mL OMNIPAQUE IOHEXOL 350 MG/ML SOLN COMPARISON:  CT dated November 30, 2020 FINDINGS: CTA CHEST FINDINGS Cardiovascular: Contrast injection is sufficient to demonstrate satisfactory opacification of the pulmonary arteries to the segmental level. There is no pulmonary embolus or evidence of right heart strain. The size of the main pulmonary artery is normal. Heart size is normal, with no pericardial effusion. The course and caliber of the aorta are normal. There is mild atherosclerotic calcification. Opacification decreased due to pulmonary arterial phase contrast bolus timing. Mediastinum/Nodes: -- No mediastinal lymphadenopathy. -- No hilar lymphadenopathy. -- No axillary lymphadenopathy. -- No supraclavicular lymphadenopathy. -- Normal thyroid gland where visualized. -  Unremarkable esophagus. Lungs/Pleura: Airways are patent. No pleural effusion, lobar consolidation, pneumothorax or pulmonary infarction. Musculoskeletal: There are old left-sided rib fractures. No acute displaced fracture. Review of the MIP images confirms the above findings. CT ABDOMEN and PELVIS FINDINGS Hepatobiliary: The liver is normal. Normal gallbladder.There is no biliary ductal  dilation. Pancreas: Normal contours without ductal dilatation. No peripancreatic fluid collection. Spleen: Unremarkable. Adrenals/Urinary Tract: --Adrenal glands: Unremarkable. --Right kidney/ureter: There is a nonobstructing 4 mm stone in the interpolar region of the right kidney. --Left kidney/ureter: No hydronephrosis or radiopaque kidney stones. --Urinary bladder: The urinary bladder is distended. Stomach/Bowel: --Stomach/Duodenum: No hiatal hernia or other gastric abnormality. Normal duodenal course and caliber. --Small bowel: Unremarkable. --Colon: Unremarkable. --Appendix: Normal. Vascular/Lymphatic: Atherosclerotic calcification is present within the non-aneurysmal abdominal aorta, without hemodynamically significant stenosis. --No retroperitoneal lymphadenopathy. --No mesenteric lymphadenopathy. --No pelvic or inguinal lymphadenopathy. Reproductive: Status post hysterectomy. No adnexal mass. Other: No ascites or free air. The abdominal wall is normal. Musculoskeletal. Compression fractures are again noted of the lumbar spine, not substantially changed from prior study. Review of the MIP images confirms the above findings. IMPRESSION: 1. No acute abnormality. 2. Nonobstructing right nephrolithiasis. 3. Distended urinary bladder. Aortic Atherosclerosis (ICD10-I70.0). Electronically Signed   By: Constance Holster M.D.   On: 11/30/2020 21:56   DG Chest Port 1 View  Result Date: 11/30/2020 CLINICAL DATA:  Fever, hypokalemia, altered mental status EXAM: PORTABLE CHEST 1 VIEW COMPARISON:  CT 02/09/2012, radiograph 02/09/2012 FINDINGS: Low lung volumes with some hazy opacities atelectasis or pulmonary edema given the presence of pulmonary vascular congestion and fissural thickening. Prominence of the cardiomediastinal silhouette may be accentuated by the portable technique and/or low volumes. The aorta is calcified. The remaining cardiomediastinal contours are unremarkable. No acute osseous or soft tissue  abnormality. Degenerative changes are present in the imaged spine and shoulders. IMPRESSION: Low lung volumes with hazy opacities likely reflect atelectasis or early edema given pulmonary vascular congestion. Underlying infection difficult to exclude in the setting of the fever. Electronically Signed   By: Lovena Le M.D.   On: 11/30/2020 19:22   CT Renal Stone Study  Result Date:  11/30/2020 CLINICAL DATA:  Flank pain EXAM: CT ABDOMEN AND PELVIS WITHOUT CONTRAST TECHNIQUE: Multidetector CT imaging of the abdomen and pelvis was performed following the standard protocol without IV contrast. COMPARISON:  CT abdomen dated 01/17/2019 FINDINGS: Lower chest: No acute abnormality. Hepatobiliary: No focal liver abnormality is seen. Gallbladder is unremarkable. No bile duct dilatation. Pancreas: Unremarkable. No pancreatic ductal dilatation or surrounding inflammatory changes. Spleen: Normal in size without focal abnormality. Adrenals/Urinary Tract: Adrenal glands are unremarkable. 4 mm RIGHT renal stone. Kidneys otherwise unremarkable without hydronephrosis or perinephric inflammation. No ureteral or bladder calculi. Bladder is unremarkable. Stomach/Bowel: No dilated large or small bowel loops. No evidence of bowel wall inflammation. Appendix is not seen but there are no inflammatory changes about the cecum to suggest acute appendicitis. Stomach is unremarkable, partially decompressed. Vascular/Lymphatic: Aortic atherosclerosis. No enlarged lymph nodes are seen within the abdomen or pelvis. Reproductive: Presumed hysterectomy.  No adnexal mass or free fluid. Other: No free fluid or abscess collection. No free intraperitoneal air. Musculoskeletal: No acute-appearing osseous abnormality. Stable chronic compression fracture deformities of the L1, L3 and L5 vertebral bodies. IMPRESSION: 1. No acute findings within the abdomen or pelvis. No bowel obstruction or evidence of bowel wall inflammation. No evidence of acute solid  organ abnormality. No free fluid or inflammatory change. 2. 4 mm nonobstructing RIGHT renal stone. No ureteral or bladder calculi. 3. Chronic compression fracture deformities of the L1, L3 and L5 vertebral bodies, not significantly changed compared to earlier CT of 01/17/2019. Aortic Atherosclerosis (ICD10-I70.0). Electronically Signed   By: Franki Cabot M.D.   On: 11/30/2020 12:42      Flora Lipps, MD  [facility] 12/01/2020  If 7PM-7AM, please contact night-coverage

## 2020-12-01 NOTE — Progress Notes (Signed)
Pharmacy Antibiotic Note  [name] is a 75 y.o. female admitted on 11/30/2020 with confusion, high fever. Pharmacy has been consulted for cefepime and vancomycin for sepsis.  Plan: Vancomycin 1gm IV x 1 then 1250mg  q24h (Scr 0.8, AUC 462.2) Cefepime 2gm IV q8h Follow renal function, cultures and clinical course      Temp (24hrs), Avg:101.1 F (38.4 C), Min:98.4 F (36.9 C), Max:103.5 F (39.7 C)  Recent Labs  Lab 11/30/20 0951 11/30/20 1925 11/30/20 2040 11/30/20 2225  WBC 12.3* 10.6*  --  8.5  CREATININE 0.73 0.71  --  0.62  LATICACIDVEN  --  3.6* 1.6  --     Estimated Creatinine Clearance: 62.9 mL/min (by C-G formula based on SCr of 0.62 mg/dL).    No Known Allergies  Antimicrobials this admission: 1/30 cefepime >> 1/30 vanc >> 1/30 flagyl >> Dose adjustments this admission:   Microbiology results: 1/30 BCx:   Thank you for allowing pharmacy to be a part of this patient's care.  Dolly Rias RPh 12/01/2020, 12:09 AM

## 2020-12-02 ENCOUNTER — Inpatient Hospital Stay (HOSPITAL_COMMUNITY): Payer: Medicare HMO

## 2020-12-02 DIAGNOSIS — E785 Hyperlipidemia, unspecified: Secondary | ICD-10-CM

## 2020-12-02 DIAGNOSIS — A4102 Sepsis due to Methicillin resistant Staphylococcus aureus: Secondary | ICD-10-CM

## 2020-12-02 DIAGNOSIS — F32A Depression, unspecified: Secondary | ICD-10-CM

## 2020-12-02 DIAGNOSIS — Z923 Personal history of irradiation: Secondary | ICD-10-CM

## 2020-12-02 DIAGNOSIS — R7881 Bacteremia: Secondary | ICD-10-CM | POA: Diagnosis not present

## 2020-12-02 DIAGNOSIS — M5416 Radiculopathy, lumbar region: Secondary | ICD-10-CM

## 2020-12-02 LAB — MAGNESIUM: Magnesium: 2.3 mg/dL (ref 1.7–2.4)

## 2020-12-02 LAB — PHOSPHORUS: Phosphorus: 2.3 mg/dL — ABNORMAL LOW (ref 2.5–4.6)

## 2020-12-02 LAB — CBC
HCT: 35.7 % — ABNORMAL LOW (ref 36.0–46.0)
Hemoglobin: 11.1 g/dL — ABNORMAL LOW (ref 12.0–15.0)
MCH: 32.4 pg (ref 26.0–34.0)
MCHC: 31.1 g/dL (ref 30.0–36.0)
MCV: 104.1 fL — ABNORMAL HIGH (ref 80.0–100.0)
Platelets: 145 10*3/uL — ABNORMAL LOW (ref 150–400)
RBC: 3.43 MIL/uL — ABNORMAL LOW (ref 3.87–5.11)
RDW: 14.5 % (ref 11.5–15.5)
WBC: 8.3 10*3/uL (ref 4.0–10.5)
nRBC: 0 % (ref 0.0–0.2)

## 2020-12-02 LAB — ECHOCARDIOGRAM COMPLETE
Area-P 1/2: 8.62 cm2
Height: 65 in
S' Lateral: 2.8 cm
Weight: 2818.36 oz

## 2020-12-02 LAB — BASIC METABOLIC PANEL
Anion gap: 11 (ref 5–15)
BUN: 16 mg/dL (ref 8–23)
CO2: 18 mmol/L — ABNORMAL LOW (ref 22–32)
Calcium: 8.9 mg/dL (ref 8.9–10.3)
Chloride: 112 mmol/L — ABNORMAL HIGH (ref 98–111)
Creatinine, Ser: 0.7 mg/dL (ref 0.44–1.00)
GFR, Estimated: >60 mL/min (ref >60)
Glucose, Bld: 120 mg/dL — ABNORMAL HIGH (ref 70–99)
Potassium: 4.6 mmol/L (ref 3.5–5.1)
Sodium: 141 mmol/L (ref 135–145)

## 2020-12-02 MED ORDER — POTASSIUM & SODIUM PHOSPHATES 280-160-250 MG PO PACK
1.0000 | PACK | Freq: Three times a day (TID) | ORAL | Status: AC
  Administered 2020-12-02 – 2020-12-03 (×3): 1 via ORAL
  Filled 2020-12-02 (×3): qty 1

## 2020-12-02 MED ORDER — AMLODIPINE BESYLATE 5 MG PO TABS
5.0000 mg | ORAL_TABLET | Freq: Every day | ORAL | Status: DC
  Administered 2020-12-02 – 2020-12-06 (×5): 5 mg via ORAL
  Filled 2020-12-02 (×5): qty 1

## 2020-12-02 NOTE — Progress Notes (Signed)
INFECTIOUS DISEASE PROGRESS NOTE  ID: [name] is a 75 y.o. female with  Principal Problem:   SIRS (systemic inflammatory response syndrome) (HCC) Active Problems:   Hyperlipidemia   Malignant neoplasm of anal canal (HCC)   Depression   History of radiation therapy   Lumbar radiculopathy  Subjective: Fever to 102 this pm.  More alert and oriented. She denies wounds, injuries recently.   Abtx:  Anti-infectives (From admission, onward)    Start     Dose/Rate Route Frequency Ordered Stop   12/01/20 2200  ceFAZolin (ANCEF) IVPB 2g/100 mL premix        2 g 200 mL/hr over 30 Minutes Intravenous Every 8 hours 12/01/20 1347     12/01/20 2000  vancomycin (VANCOREADY) IVPB 1250 mg/250 mL  Status:  Discontinued        1,250 mg 166.7 mL/hr over 90 Minutes Intravenous Every 24 hours 12/01/20 0005 12/01/20 1347   12/01/20 0400  ceFEPIme (MAXIPIME) 2 g in sodium chloride 0.9 % 100 mL IVPB  Status:  Discontinued        2 g 200 mL/hr over 30 Minutes Intravenous Every 8 hours 11/30/20 2230 12/01/20 1347   11/30/20 2245  metroNIDAZOLE (FLAGYL) IVPB 500 mg  Status:  Discontinued        500 mg 100 mL/hr over 60 Minutes Intravenous Every 8 hours 11/30/20 2230 12/01/20 1347   11/30/20 2245  vancomycin (VANCOCIN) IVPB 1000 mg/200 mL premix  Status:  Discontinued        1,000 mg 200 mL/hr over 60 Minutes Intravenous  Once 11/30/20 2230 11/30/20 2237   11/30/20 1900  vancomycin (VANCOREADY) IVPB 1750 mg/350 mL        1,750 mg 175 mL/hr over 120 Minutes Intravenous STAT 11/30/20 1850 11/30/20 2231   11/30/20 1900  ceFEPIme (MAXIPIME) 2 g in sodium chloride 0.9 % 100 mL IVPB        2 g 200 mL/hr over 30 Minutes Intravenous STAT 11/30/20 1850 11/30/20 2023       Medications:  Scheduled: . amLODipine  5 mg Oral Daily  . ARIPiprazole  10 mg Oral Daily  . DULoxetine  120 mg Oral Daily  . enoxaparin (LOVENOX) injection  40 mg Subcutaneous Q24H  . folic acid  [id number] mcg Oral Daily  .  gabapentin  600 mg Oral BID  . levothyroxine  25 mcg Oral Q0600  . potassium & sodium phosphates  1 packet Oral TID AC & HS  . pravastatin  40 mg Oral Q breakfast  . traZODone  100 mg Oral QHS  . vitamin B-12  500 mcg Oral Daily    Objective: Vital signs in last 24 hours: Temp:  [98.8 F (37.1 C)-102.6 F (39.2 C)] 99.4 F (37.4 C) (02/01 1202) Pulse Rate:  [86-117] 92 (02/01 1202) Resp:  [16-29] 20 (02/01 1202) BP: (115-157)/(59-87) 146/79 (02/01 1202) SpO2:  [83 %-100 %] 97 % (02/01 1202) Weight:  [79.9 kg] 79.9 kg (01/31 2035)   General appearance: alert, cooperative and no distress Resp: clear to auscultation bilaterally Cardio: regular rate and rhythm GI: normal findings: bowel sounds normal and soft, non-tender Extremities: small laceration on R thumb  Lab Results Recent Labs    12/01/20 0255 12/02/20 0540  WBC 9.1 8.3  HGB 10.6* 11.1*  HCT 32.7* 35.7*  NA 137 141  K 3.4* 4.6  CL 107 112*  CO2 21* 18*  BUN 13 16  CREATININE 0.74 0.70   Liver Panel Recent  Labs    11/30/20 0951 12/01/20 0255  PROT 6.5 6.3*  ALBUMIN 3.7 3.3*  AST 35 31  ALT 22 25  ALKPHOS 55 55  BILITOT 0.9 0.7   Sedimentation Rate No results for input(s): ESRSEDRATE in the last 72 hours. C-Reactive Protein No results for input(s): CRP in the last 72 hours.  Microbiology: Recent Results (from the past 240 hour(s))  Urine culture     Status: None   Collection Time: 11/30/20  1:44 PM   Specimen: Urine, Random  Result Value Ref Range Status   Specimen Description   Final    URINE, RANDOM Performed at [facility] [address], [place], [place] [address]    Special Requests   Final    NONE Performed at [facility], [place] [address], [place], [place] [address]    Culture   Final    NO GROWTH Performed at [facility], [place] [address] [place], [place] [address]    Report Status 12/01/2020 FINAL  Final  Culture, blood (routine x 2)      Status: Abnormal (Preliminary result)   Collection Time: 11/30/20  7:25 PM   Specimen: BLOOD  Result Value Ref Range Status   Specimen Description   Final    BLOOD BLOOD LEFT FOREARM Performed at [facility] [address] [place], [place] [address]    Special Requests   Final    BOTTLES DRAWN AEROBIC AND ANAEROBIC Blood Culture results may not be optimal due to an inadequate volume of blood received in culture bottles   Culture  Setup Time   Final    GRAM POSITIVE COCCI IN CLUSTERS IN BOTH AEROBIC AND ANAEROBIC BOTTLES CRITICAL RESULT CALLED TO, READ BACK BY AND VERIFIED WITH: Melodye Ped PharmD 13:20 12/01/20 ([email or web address])    Culture (A)  Final    STAPHYLOCOCCUS AUREUS SUSCEPTIBILITIES TO FOLLOW Performed at [facility], [place] [address], [place], [place] [address]    Report Status PENDING  Incomplete  SARS Coronavirus 2 by RT PCR (hospital order, performed in [facility] hospital lab) Nasopharyngeal Nasopharyngeal Swab     Status: None   Collection Time: 11/30/20  7:25 PM   Specimen: Nasopharyngeal Swab  Result Value Ref Range Status   SARS Coronavirus 2 NEGATIVE NEGATIVE Final    Comment: (NOTE) SARS-CoV-2 target nucleic acids are NOT DETECTED.  The SARS-CoV-2 RNA is generally detectable in upper and lower respiratory specimens during the acute phase of infection. The lowest concentration of SARS-CoV-2 viral copies this assay can detect is 250 copies / mL. A negative result does not preclude SARS-CoV-2 infection and should not be used as the sole basis for treatment or other patient management decisions.  A negative result may occur with improper specimen collection / handling, submission of specimen other than nasopharyngeal swab, presence of viral mutation(s) within the areas targeted by this assay, and inadequate number of viral copies (<250 copies / mL). A negative result must be combined with clinical observations, patient history, and  epidemiological information.  Fact Sheet for Patients:   StrictlyIdeas.no  Fact Sheet for Healthcare Providers: BankingDealers.co.za  This test is not yet approved or  cleared by the [place] FDA and has been authorized for detection and/or diagnosis of SARS-CoV-2 by FDA under an Emergency Use Authorization (EUA).  This EUA will remain in effect (meaning this test can be used) for the duration of the COVID-19 declaration under Section 564(b)(1) of the Act, 21  U.S.C. section 360bbb-3(b)(1), unless the authorization is terminated or revoked sooner.  Performed at [facility], [place] [address] [place], [place] [address]   Blood Culture ID Panel (Reflexed)     Status: Abnormal   Collection Time: 11/30/20  7:25 PM  Result Value Ref Range Status   Enterococcus faecalis NOT DETECTED NOT DETECTED Final   Enterococcus Faecium NOT DETECTED NOT DETECTED Final   Listeria monocytogenes NOT DETECTED NOT DETECTED Final   Staphylococcus species DETECTED (A) NOT DETECTED Final    Comment: CRITICAL RESULT CALLED TO, READ BACK BY AND VERIFIED WITH: Melodye Ped PharmD 13:20 12/01/20 ([email or web address])    Staphylococcus aureus (BCID) DETECTED (A) NOT DETECTED Final    Comment: CRITICAL RESULT CALLED TO, READ BACK BY AND VERIFIED WITH: Melodye Ped PharmD 13:20 12/01/20 ([email or web address])    Staphylococcus epidermidis NOT DETECTED NOT DETECTED Final   Staphylococcus lugdunensis NOT DETECTED NOT DETECTED Final   Streptococcus species NOT DETECTED NOT DETECTED Final   Streptococcus agalactiae NOT DETECTED NOT DETECTED Final   Streptococcus pneumoniae NOT DETECTED NOT DETECTED Final   Streptococcus pyogenes NOT DETECTED NOT DETECTED Final   A.calcoaceticus-baumannii NOT DETECTED NOT DETECTED Final   Bacteroides fragilis NOT DETECTED NOT DETECTED Final   Enterobacterales NOT DETECTED NOT DETECTED Final   Enterobacter cloacae complex NOT DETECTED NOT  DETECTED Final   Escherichia coli NOT DETECTED NOT DETECTED Final   Klebsiella aerogenes NOT DETECTED NOT DETECTED Final   Klebsiella oxytoca NOT DETECTED NOT DETECTED Final   Klebsiella pneumoniae NOT DETECTED NOT DETECTED Final   Proteus species NOT DETECTED NOT DETECTED Final   Salmonella species NOT DETECTED NOT DETECTED Final   Serratia marcescens NOT DETECTED NOT DETECTED Final   Haemophilus influenzae NOT DETECTED NOT DETECTED Final   Neisseria meningitidis NOT DETECTED NOT DETECTED Final   Pseudomonas aeruginosa NOT DETECTED NOT DETECTED Final   Stenotrophomonas maltophilia NOT DETECTED NOT DETECTED Final   Candida albicans NOT DETECTED NOT DETECTED Final   Candida auris NOT DETECTED NOT DETECTED Final   Candida glabrata NOT DETECTED NOT DETECTED Final   Candida krusei NOT DETECTED NOT DETECTED Final   Candida parapsilosis NOT DETECTED NOT DETECTED Final   Candida tropicalis NOT DETECTED NOT DETECTED Final   Cryptococcus neoformans/gattii NOT DETECTED NOT DETECTED Final   Meth resistant mecA/C and MREJ NOT DETECTED NOT DETECTED Final    Comment: Performed at [facility], [address] [place], [place] [address]  Culture, blood (Routine X 2) w Reflex to ID Panel     Status: Abnormal (Preliminary result)   Collection Time: 11/30/20  8:06 PM   Specimen: BLOOD  Result Value Ref Range Status   Specimen Description   Final    BLOOD BLOOD LEFT HAND Performed at [facility] [address] [place], [place] [address]    Special Requests   Final    BOTTLES DRAWN AEROBIC AND ANAEROBIC Blood Culture results may not be optimal due to an inadequate volume of blood received in culture bottles Performed at [facility] [address], [place], [place] [address]    Culture  Setup Time   Final    GRAM POSITIVE COCCI IN CLUSTERS IN BOTH AEROBIC AND ANAEROBIC BOTTLES CRITICAL VALUE NOTED.  VALUE IS CONSISTENT WITH PREVIOUSLY REPORTED AND  CALLED VALUE.    Culture (A)  Final    STAPHYLOCOCCUS AUREUS SUSCEPTIBILITIES PERFORMED ON PREVIOUS CULTURE WITHIN THE LAST 5 DAYS. Performed at [facility], [place]  [address] [place], [place] [address]    Report Status PENDING  Incomplete    Studies/Results: CT Head Wo Contrast  Result Date: 11/30/2020 CLINICAL DATA:  Delirium.  Discharged today. EXAM: CT HEAD WITHOUT CONTRAST TECHNIQUE: Contiguous axial images were obtained from the base of the skull through the vertex without intravenous contrast. COMPARISON:  None. FINDINGS: Brain: Normal for age atrophy. No intracranial hemorrhage, mass effect, or midline shift. No hydrocephalus. The basilar cisterns are patent. No evidence of territorial infarct or acute ischemia. No extra-axial or intracranial fluid collection. Vascular: Atherosclerosis of skullbase vasculature without hyperdense vessel or abnormal calcification. Skull: No fracture or focal lesion. Sinuses/Orbits: Minor mucosal thickening of ethmoid air cells. No sinus air-fluid levels. Mastoid air cells are clear. Orbits are unremarkable. Other: None. IMPRESSION: 1. No acute intracranial abnormality. 2. Normal for age atrophy. Electronically Signed   By: Keith Rake M.D.   On: 11/30/2020 21:53   CT Angio Chest PE W and/or Wo Contrast  Result Date: 11/30/2020 CLINICAL DATA:  Pain and fever. EXAM: CT ANGIOGRAPHY CHEST CT ABDOMEN AND PELVIS WITH CONTRAST TECHNIQUE: Multidetector CT imaging of the chest was performed using the standard protocol during bolus administration of intravenous contrast. Multiplanar CT image reconstructions and MIPs were obtained to evaluate the vascular anatomy. Multidetector CT imaging of the abdomen and pelvis was performed using the standard protocol during bolus administration of intravenous contrast. CONTRAST:  168mL OMNIPAQUE IOHEXOL 350 MG/ML SOLN COMPARISON:  CT dated November 30, 2020 FINDINGS: CTA CHEST FINDINGS Cardiovascular: Contrast injection is  sufficient to demonstrate satisfactory opacification of the pulmonary arteries to the segmental level. There is no pulmonary embolus or evidence of right heart strain. The size of the main pulmonary artery is normal. Heart size is normal, with no pericardial effusion. The course and caliber of the aorta are normal. There is mild atherosclerotic calcification. Opacification decreased due to pulmonary arterial phase contrast bolus timing. Mediastinum/Nodes: -- No mediastinal lymphadenopathy. -- No hilar lymphadenopathy. -- No axillary lymphadenopathy. -- No supraclavicular lymphadenopathy. -- Normal thyroid gland where visualized. -  Unremarkable esophagus. Lungs/Pleura: Airways are patent. No pleural effusion, lobar consolidation, pneumothorax or pulmonary infarction. Musculoskeletal: There are old left-sided rib fractures. No acute displaced fracture. Review of the MIP images confirms the above findings. CT ABDOMEN and PELVIS FINDINGS Hepatobiliary: The liver is normal. Normal gallbladder.There is no biliary ductal dilation. Pancreas: Normal contours without ductal dilatation. No peripancreatic fluid collection. Spleen: Unremarkable. Adrenals/Urinary Tract: --Adrenal glands: Unremarkable. --Right kidney/ureter: There is a nonobstructing 4 mm stone in the interpolar region of the right kidney. --Left kidney/ureter: No hydronephrosis or radiopaque kidney stones. --Urinary bladder: The urinary bladder is distended. Stomach/Bowel: --Stomach/Duodenum: No hiatal hernia or other gastric abnormality. Normal duodenal course and caliber. --Small bowel: Unremarkable. --Colon: Unremarkable. --Appendix: Normal. Vascular/Lymphatic: Atherosclerotic calcification is present within the non-aneurysmal abdominal aorta, without hemodynamically significant stenosis. --No retroperitoneal lymphadenopathy. --No mesenteric lymphadenopathy. --No pelvic or inguinal lymphadenopathy. Reproductive: Status post hysterectomy. No adnexal mass.  Other: No ascites or free air. The abdominal wall is normal. Musculoskeletal. Compression fractures are again noted of the lumbar spine, not substantially changed from prior study. Review of the MIP images confirms the above findings. IMPRESSION: 1. No acute abnormality. 2. Nonobstructing right nephrolithiasis. 3. Distended urinary bladder. Aortic Atherosclerosis (ICD10-I70.0). Electronically Signed   By: Constance Holster M.D.   On: 11/30/2020 21:56   CT ABDOMEN PELVIS W CONTRAST  Result Date: 11/30/2020 CLINICAL DATA:  Pain and fever. EXAM: CT ANGIOGRAPHY CHEST CT ABDOMEN AND PELVIS WITH  CONTRAST TECHNIQUE: Multidetector CT imaging of the chest was performed using the standard protocol during bolus administration of intravenous contrast. Multiplanar CT image reconstructions and MIPs were obtained to evaluate the vascular anatomy. Multidetector CT imaging of the abdomen and pelvis was performed using the standard protocol during bolus administration of intravenous contrast. CONTRAST:  150mL OMNIPAQUE IOHEXOL 350 MG/ML SOLN COMPARISON:  CT dated November 30, 2020 FINDINGS: CTA CHEST FINDINGS Cardiovascular: Contrast injection is sufficient to demonstrate satisfactory opacification of the pulmonary arteries to the segmental level. There is no pulmonary embolus or evidence of right heart strain. The size of the main pulmonary artery is normal. Heart size is normal, with no pericardial effusion. The course and caliber of the aorta are normal. There is mild atherosclerotic calcification. Opacification decreased due to pulmonary arterial phase contrast bolus timing. Mediastinum/Nodes: -- No mediastinal lymphadenopathy. -- No hilar lymphadenopathy. -- No axillary lymphadenopathy. -- No supraclavicular lymphadenopathy. -- Normal thyroid gland where visualized. -  Unremarkable esophagus. Lungs/Pleura: Airways are patent. No pleural effusion, lobar consolidation, pneumothorax or pulmonary infarction. Musculoskeletal:  There are old left-sided rib fractures. No acute displaced fracture. Review of the MIP images confirms the above findings. CT ABDOMEN and PELVIS FINDINGS Hepatobiliary: The liver is normal. Normal gallbladder.There is no biliary ductal dilation. Pancreas: Normal contours without ductal dilatation. No peripancreatic fluid collection. Spleen: Unremarkable. Adrenals/Urinary Tract: --Adrenal glands: Unremarkable. --Right kidney/ureter: There is a nonobstructing 4 mm stone in the interpolar region of the right kidney. --Left kidney/ureter: No hydronephrosis or radiopaque kidney stones. --Urinary bladder: The urinary bladder is distended. Stomach/Bowel: --Stomach/Duodenum: No hiatal hernia or other gastric abnormality. Normal duodenal course and caliber. --Small bowel: Unremarkable. --Colon: Unremarkable. --Appendix: Normal. Vascular/Lymphatic: Atherosclerotic calcification is present within the non-aneurysmal abdominal aorta, without hemodynamically significant stenosis. --No retroperitoneal lymphadenopathy. --No mesenteric lymphadenopathy. --No pelvic or inguinal lymphadenopathy. Reproductive: Status post hysterectomy. No adnexal mass. Other: No ascites or free air. The abdominal wall is normal. Musculoskeletal. Compression fractures are again noted of the lumbar spine, not substantially changed from prior study. Review of the MIP images confirms the above findings. IMPRESSION: 1. No acute abnormality. 2. Nonobstructing right nephrolithiasis. 3. Distended urinary bladder. Aortic Atherosclerosis (ICD10-I70.0). Electronically Signed   By: Constance Holster M.D.   On: 11/30/2020 21:56   DG Chest Port 1 View  Result Date: 11/30/2020 CLINICAL DATA:  Fever, hypokalemia, altered mental status EXAM: PORTABLE CHEST 1 VIEW COMPARISON:  CT 02/09/2012, radiograph 02/09/2012 FINDINGS: Low lung volumes with some hazy opacities atelectasis or pulmonary edema given the presence of pulmonary vascular congestion and fissural  thickening. Prominence of the cardiomediastinal silhouette may be accentuated by the portable technique and/or low volumes. The aorta is calcified. The remaining cardiomediastinal contours are unremarkable. No acute osseous or soft tissue abnormality. Degenerative changes are present in the imaged spine and shoulders. IMPRESSION: Low lung volumes with hazy opacities likely reflect atelectasis or early edema given pulmonary vascular congestion. Underlying infection difficult to exclude in the setting of the fever. Electronically Signed   By: Lovena Le M.D.   On: 11/30/2020 19:22   ECHOCARDIOGRAM COMPLETE  Result Date: 12/02/2020    ECHOCARDIOGRAM REPORT   Patient Name:   [name] Date of Exam: 12/02/2020 Medical Rec #:  [id number]       Height:       65.0 in Accession #:    [id number]      Weight:       176.1 lb Date of Birth:  1946-09-14  BSA:          1.874 m Patient Age:    74 years        BP:           146/79 mmHg Patient Gender: F               HR:           92 bpm. Exam Location:  Inpatient Procedure: 2D Echo, Cardiac Doppler and Color Doppler Indications:    Bacteremia  History:        Patient has no prior history of Echocardiogram examinations.                 Signs/Symptoms:Bacteremia and Fever; Risk Factors:Hypertension.                 Sepsis.  Sonographer:    Lavenia AtlasBrooke Strickland Referring Phys: [phone] [facility] IMPRESSIONS  1. Left ventricular ejection fraction, by estimation, is 55 to 60%. The left ventricle has normal function. The left ventricle has no regional wall motion abnormalities. Left ventricular diastolic parameters are consistent with Grade I diastolic dysfunction (impaired relaxation).  2. Right ventricular systolic function is normal. The right ventricular size is normal.  3. The mitral valve is normal in structure. No evidence of mitral valve regurgitation. No evidence of mitral stenosis.  4. The aortic valve is normal in structure. Aortic valve regurgitation is not  visualized. No aortic stenosis is present.  5. The inferior vena cava is normal in size with greater than 50% respiratory variability, suggesting right atrial pressure of 3 mmHg. Conclusion(s)/Recommendation(s): No evidence of valvular vegetations on this transthoracic echocardiogram. Would recommend a transesophageal echocardiogram to exclude infective endocarditis if clinically indicated. FINDINGS  Left Ventricle: Left ventricular ejection fraction, by estimation, is 55 to 60%. The left ventricle has normal function. The left ventricle has no regional wall motion abnormalities. The left ventricular internal cavity size was normal in size. There is  no left ventricular hypertrophy. Left ventricular diastolic parameters are consistent with Grade I diastolic dysfunction (impaired relaxation). Indeterminate filling pressures. Right Ventricle: The right ventricular size is normal. No increase in right ventricular wall thickness. Right ventricular systolic function is normal. Left Atrium: Left atrial size was normal in size. Right Atrium: Right atrial size was normal in size. Pericardium: There is no evidence of pericardial effusion. Mitral Valve: The mitral valve is normal in structure. No evidence of mitral valve regurgitation. No evidence of mitral valve stenosis. Tricuspid Valve: The tricuspid valve is normal in structure. Tricuspid valve regurgitation is not demonstrated. No evidence of tricuspid stenosis. Aortic Valve: The aortic valve is normal in structure. Aortic valve regurgitation is not visualized. No aortic stenosis is present. Pulmonic Valve: The pulmonic valve was normal in structure. Pulmonic valve regurgitation is not visualized. No evidence of pulmonic stenosis. Aorta: The aortic root is normal in size and structure. Venous: The inferior vena cava is normal in size with greater than 50% respiratory variability, suggesting right atrial pressure of 3 mmHg. IAS/Shunts: No atrial level shunt detected by  color flow Doppler.  LEFT VENTRICLE PLAX 2D LVIDd:         4.50 cm  Diastology LVIDs:         2.80 cm  LV e' medial:    5.77 cm/s LV PW:         1.10 cm  LV E/e' medial:  15.7 LV IVS:        1.10 cm  LV e' lateral:  9.79 cm/s LVOT diam:     1.90 cm  LV E/e' lateral: 9.3 LV SV:         54 LV SV Index:   29 LVOT Area:     2.84 cm  RIGHT VENTRICLE RV Basal diam:  2.70 cm RV S prime:     13.20 cm/s LEFT ATRIUM           Index       RIGHT ATRIUM           Index LA diam:      3.30 cm 1.76 cm/m  RA Area:     13.20 cm LA Vol (A2C): 29.7 ml 15.85 ml/m RA Volume:   29.00 ml  15.47 ml/m LA Vol (A4C): 34.2 ml 18.25 ml/m  AORTIC VALVE LVOT Vmax:   106.00 cm/s LVOT Vmean:  69.300 cm/s LVOT VTI:    0.189 m  AORTA Ao Root diam: 2.40 cm MITRAL VALVE MV Area (PHT): 8.62 cm    SHUNTS MV Decel Time: 88 msec     Systemic VTI:  0.19 m MV E velocity: 90.70 cm/s  Systemic Diam: 1.90 cm MV A velocity: 99.40 cm/s MV E/A ratio:  0.91 Mihai Croitoru MD Electronically signed by Sanda Klein MD Signature Date/Time: 12/02/2020/3:52:17 PM    Final      Assessment/Plan: Staph bacteremia/MSSA R thumb wound fever Encephalopathy  Total days of antibiotics: 2 ancef  Will continue ancef If fever persists, image chest/abd/pelvis Needs TEE.  Repeat BCx sent today.          Bobby Rumpf MD, FACP Infectious Diseases (pager) [phone] [email or web address] 12/02/2020, 5:17 PM  LOS: 2 days         Start Antimicrobial Management Team Staphylococcus aureus bacteremia   Staphylococcus aureus bacteremia (SAB) is associated with a high rate of complications and mortality.  Specific aspects of clinical management are critical to optimizing the outcome of patients with SAB.  Therefore, the [facility] Antimicrobial Management Team [facility]) has initiated an intervention aimed at improving the management of SAB at [facility].  To do so, Infectious Diseases physicians are providing an evidence-based consult for the  management of all patients with SAB.     Yes No Comments  Perform follow-up blood cultures (even if the patient is afebrile) to ensure clearance of bacteremia [x]  []    Remove vascular catheter and obtain follow-up blood cultures after the removal of the catheter [x]  []    Perform echocardiography to evaluate for endocarditis (transthoracic ECHO is 40-50% sensitive, TEE is > 90% sensitive) [x]  []  Please keep in mind, that neither test can definitively EXCLUDE endocarditis, and that should clinical suspicion remain high for endocarditis the patient should then still be treated with an "endocarditis" duration of therapy = 6 weeks  Consult electrophysiologist to evaluate implanted cardiac device (pacemaker, ICD) []  []    Ensure source control []  []  Have all abscesses been drained effectively? Have deep seeded infections (septic joints or osteomyelitis) had appropriate surgical debridement?  Investigate for "metastatic" sites of infection []  []  Does the patient have ANY symptom or physical exam finding that would suggest a deeper infection (back or neck pain that may be suggestive of vertebral osteomyelitis or epidural abscess, muscle pain that could be a symptom of pyomyositis)?  Keep in mind that for deep seeded infections MRI imaging with contrast is preferred rather than other often insensitive tests such as plain x-rays, especially early in a patient's presentation.  Change antibiotic therapy to __________________ [x]  []   Beta-lactam antibiotics are preferred for MSSA due to higher cure rates.   If on Vancomycin, goal trough should be 15 - 20 mcg/mL  Estimated duration of IV antibiotic therapy:   [x]  []  Consult case management for probably prolonged outpatient IV antibiotic therapy

## 2020-12-02 NOTE — Plan of Care (Signed)

## 2020-12-02 NOTE — Progress Notes (Signed)
2D Echocardiogram has been performed.  [name] 12/02/2020, 3:13 PM

## 2020-12-02 NOTE — Progress Notes (Signed)
PROGRESS NOTE  [name] [id number] DOB: [birth year] DOA: 11/30/2020 PCP: [name], MD   LOS: 2 days   Brief narrative:  [name] is a 75 y.o. female with past medical history of hypothyroidism and depression presented to the hospital with cough and not feeling well for the last 2 weeks.  Patient had presented to the ED a day before this admission for dysuria and a CT scan of the abdomen was done which showed nothing acute.  He was then discharged home but during the evening patient was noted to be more febrile and confused and was brought into the back to the hospital.   In the ED, patient was noted to have temperature 103 F, tachycardic with elevated lactic acid levels features concerning for sepsis.  CT angiogram of the chest, abdomen pelvis did not show anything acute.  CT renal study done during earlier visit showed nonobstructing stones and did show chronic compression fractures of the lumbar spine change from previous.  CT head was unremarkable.  Covid test was negative.  Blood cultures were drawn and started on sepsis protocol antibiotics were started. Lactic acid improved. procalcitonin was 1.15.  She was then admitted to hospital for sepsis.  Assessment/Plan:  Principal Problem:   SIRS (systemic inflammatory response syndrome) (HCC) Active Problems:   Malignant neoplasm of anal canal (HCC)  MSSA bacteremia/sepsis.  obvious source not clear at this time. 3/4 bottles positive.  Patient did have tachycardia, leukocytosis, fever elevated lactic acid on presentation.  Lactate of 3.6. initially received IV fluid bolus.  Currently on IV cefazolin.  ID on board.  UA was negative.  CT scan of the chest and abdomen was negative .  We will get transthoracic echocardiogram to screen.  Patient will need TEE.  Spoke with cardiology for TEE but it would likely be delayed until Friday.  Communicated with Dr. Johnnye Sima about it.  T-max of 102.6 F in the last 24 hours.  We will  continue to monitor.  DC IV fluids  Acute metabolic encephalopathy secondary to infection.  Improved.  Patient is much more alert awake and communicative today.  History of depression on Abilify and Cymbalta.    Stable  Anemia of chronic disease.. Elevated MCV.  Monitor CBC.  Check vitamin [id number] folic acid.  Hypothyroidism continue Synthroid.  History of anal cancer stage IIIa.  Followed by oncology as outpatient.  History of radiation treatment.  Elevated blood pressure.  Not on antihypertensive medications at home.  It is likely that patient might need antihypertensives on medication.  Could be new hypertension.  Will add low-dose amlodipine 5 mg daily.  Monitor blood pressure.  Hypokalemia.  Improved after replacement.  Hypophosphatemia.  We will replace orally.  Check levels in a.m.  Neuropathy.   Continue gabapentin   Debility, deconditioning, generalized weakness, recent fall. Lives alone, family concerned about back pain and issues and mobility.  Staff reported that patient required 2 person assist,will get PT and OT.  Will check vitamin 123456 folic acid.  DVT prophylaxis: enoxaparin (LOVENOX) injection 40 mg Start: 12/01/20 1000  Code Status: Full code  Family Communication:  I spoke with the patient's daughter Ms April on the phone and updated her about the clinical condition of the patient.   Status is: Inpatient  Remains inpatient appropriate because:IV treatments appropriate due to intensity of illness or inability to take PO and Inpatient level of care appropriate due to severity of illness   Dispo: The patient is from: Home  Anticipated d/c is to: Home, with home health pending PT/Tevaluation.              Anticipated d/c date is: 2 days              Patient currently is not medically stable to d/c.   Barriers to discharge.  Bacteremia requiring IV antibiotic, pending TEE, pending PT evaluation, possible need for rehabilitation   Difficult to place patient  No  Consultants:  ID   Cardiology notified for TEE  Procedures:  None  Anti-infectives:  . Cefazolin 12/01/2020>  Anti-infectives (From admission, onward)   Start     Dose/Rate Route Frequency Ordered Stop   12/01/20 2200  ceFAZolin (ANCEF) IVPB 2g/100 mL premix        2 g 200 mL/hr over 30 Minutes Intravenous Every 8 hours 12/01/20 1347     12/01/20 2000  vancomycin (VANCOREADY) IVPB 1250 mg/250 mL  Status:  Discontinued        1,250 mg 166.7 mL/hr over 90 Minutes Intravenous Every 24 hours 12/01/20 0005 12/01/20 1347   12/01/20 0400  ceFEPIme (MAXIPIME) 2 g in sodium chloride 0.9 % 100 mL IVPB  Status:  Discontinued        2 g 200 mL/hr over 30 Minutes Intravenous Every 8 hours 11/30/20 2230 12/01/20 1347   11/30/20 2245  metroNIDAZOLE (FLAGYL) IVPB 500 mg  Status:  Discontinued        500 mg 100 mL/hr over 60 Minutes Intravenous Every 8 hours 11/30/20 2230 12/01/20 1347   11/30/20 2245  vancomycin (VANCOCIN) IVPB 1000 mg/200 mL premix  Status:  Discontinued        1,000 mg 200 mL/hr over 60 Minutes Intravenous  Once 11/30/20 2230 11/30/20 2237   11/30/20 1900  vancomycin (VANCOREADY) IVPB 1750 mg/350 mL        1,750 mg 175 mL/hr over 120 Minutes Intravenous STAT 11/30/20 1850 11/30/20 2231   11/30/20 1900  ceFEPIme (MAXIPIME) 2 g in sodium chloride 0.9 % 100 mL IVPB        2 g 200 mL/hr over 30 Minutes Intravenous STAT 11/30/20 1850 11/30/20 2023     Subjective:  Today, patient was seen and examined at bedside.  Patient wishes to go home.  Spoke with the patient daughter on the phone.  She stated that patient had a fall recently but did not tell her family and is worried about her being alone at home.  Patient denies any nausea vomiting abdominal pain fever or chills.  Denies any cough or chest pain.   Objective: Vitals:   12/02/20 0800 12/02/20 1202  BP: (!) 146/70 (!) 146/79  Pulse: 98 92  Resp: 20 20  Temp: 99.1 F (37.3 C) 99.4 F (37.4 C)  SpO2: (!) 83%  97%    Intake/Output Summary (Last 24 hours) at 12/02/2020 1202 Last data filed at 12/02/2020 0616 Gross per 24 hour  Intake 870.69 ml  Output 400 ml  Net 470.69 ml   Filed Weights   12/01/20 2035  Weight: 79.9 kg   Body mass index is 29.31 kg/m.   Physical Exam: GENERAL: Patient is alert awake and communicative, answering appropriate. not in obvious distress. HENT: No scleral pallor or icterus. Pupils equally reactive to light. Oral mucosa is moist NECK: is supple, no gross swelling noted. CHEST: Clear to auscultation. No crackles or wheezes.  Diminished breath sounds bilaterally. CVS: S1 and S2 heard, no murmur. Regular rate and rhythm.  ABDOMEN: Soft, non-tender, bowel  sounds are present. EXTREMITIES: No edema.  Generalized weakness noted CNS:  alert awake, communicative, oriented to place person moving all extremities, answering appropriately.  Moving all extremities.  No cranial nerve deficits SKIN: warm and dry without rashes.  Data Review: I have personally reviewed the following laboratory data and studies,  CBC: Recent Labs  Lab 11/30/20 0951 11/30/20 1925 11/30/20 2225 12/01/20 0255 12/02/20 0540  WBC 12.3* 10.6* 8.5 9.1 8.3  NEUTROABS 10.9* 9.8*  --  8.2*  --   HGB 10.5* 12.6 9.9* 10.6* 11.1*  HCT 31.3* 38.2 30.8* 32.7* 35.7*  MCV 97.8 99.7 100.7* 100.9* 104.1*  PLT 171 188 146* 162 638*   Basic Metabolic Panel: Recent Labs  Lab 11/30/20 0951 11/30/20 1925 11/30/20 2225 12/01/20 0255 12/02/20 0540  NA 134* 139  --  137 141  K 2.8* 3.3*  --  3.4* 4.6  CL 100 102  --  107 112*  CO2 21* 22  --  21* 18*  GLUCOSE 149* 107*  --  108* 120*  BUN 14 14  --  13 16  CREATININE 0.73 0.71 0.62 0.74 0.70  CALCIUM 8.7* 9.5  --  8.3* 8.9  MG 1.7  --   --   --  2.3  PHOS  --   --   --   --  2.3*   Liver Function Tests: Recent Labs  Lab 11/30/20 0951 12/01/20 0255  AST 35 31  ALT 22 25  ALKPHOS 55 55  BILITOT 0.9 0.7  PROT 6.5 6.3*  ALBUMIN 3.7 3.3*    No results for input(s): LIPASE, AMYLASE in the last 168 hours. No results for input(s): AMMONIA in the last 168 hours. Cardiac Enzymes: No results for input(s): CKTOTAL, CKMB, CKMBINDEX, TROPONINI in the last 168 hours. BNP (last 3 results) No results for input(s): BNP in the last 8760 hours.  ProBNP (last 3 results) No results for input(s): PROBNP in the last 8760 hours.  CBG: Recent Labs  Lab 12/01/20 1800 12/01/20 1855  GLUCAP 60* 90   Recent Results (from the past 240 hour(s))  Urine culture     Status: None   Collection Time: 11/30/20  1:44 PM   Specimen: Urine, Random  Result Value Ref Range Status   Specimen Description   Final    URINE, RANDOM Performed at [facility] [address] [place], [place] [address]    Special Requests   Final    NONE Performed at [facility], [place] [address] [place], [place] [address]    Culture   Final    NO GROWTH Performed at [facility], [place] [address], [place], [place] [address]    Report Status 12/01/2020 FINAL  Final  Culture, blood (routine x 2)     Status: Abnormal (Preliminary result)   Collection Time: 11/30/20  7:25 PM   Specimen: BLOOD  Result Value Ref Range Status   Specimen Description   Final    BLOOD BLOOD LEFT FOREARM Performed at [facility] [address] [place], [place] [address]    Special Requests   Final    BOTTLES DRAWN AEROBIC AND ANAEROBIC Blood Culture results may not be optimal due to an inadequate volume of blood received in culture bottles   Culture  Setup Time   Final    GRAM POSITIVE COCCI IN CLUSTERS IN BOTH AEROBIC AND ANAEROBIC BOTTLES CRITICAL RESULT CALLED TO, READ BACK BY AND VERIFIED WITH: Melodye Ped PharmD  13:20 12/01/20 ([email or web address])    Culture (A)  Final    STAPHYLOCOCCUS AUREUS SUSCEPTIBILITIES TO FOLLOW Performed at [facility], [place] [address] [place], [place] [address]    Report Status PENDING   Incomplete  SARS Coronavirus 2 by RT PCR (hospital order, performed in [facility] hospital lab) Nasopharyngeal Nasopharyngeal Swab     Status: None   Collection Time: 11/30/20  7:25 PM   Specimen: Nasopharyngeal Swab  Result Value Ref Range Status   SARS Coronavirus 2 NEGATIVE NEGATIVE Final    Comment: (NOTE) SARS-CoV-2 target nucleic acids are NOT DETECTED.  The SARS-CoV-2 RNA is generally detectable in upper and lower respiratory specimens during the acute phase of infection. The lowest concentration of SARS-CoV-2 viral copies this assay can detect is 250 copies / mL. A negative result does not preclude SARS-CoV-2 infection and should not be used as the sole basis for treatment or other patient management decisions.  A negative result may occur with improper specimen collection / handling, submission of specimen other than nasopharyngeal swab, presence of viral mutation(s) within the areas targeted by this assay, and inadequate number of viral copies (<250 copies / mL). A negative result must be combined with clinical observations, patient history, and epidemiological information.  Fact Sheet for Patients:   StrictlyIdeas.no  Fact Sheet for Healthcare Providers: BankingDealers.co.za  This test is not yet approved or  cleared by the [place] FDA and has been authorized for detection and/or diagnosis of SARS-CoV-2 by FDA under an Emergency Use Authorization (EUA).  This EUA will remain in effect (meaning this test can be used) for the duration of the COVID-19 declaration under Section 564(b)(1) of the Act, 21 U.S.C. section 360bbb-3(b)(1), unless the authorization is terminated or revoked sooner.  Performed at [facility], [place] [address], [place], [place] [address]   Blood Culture ID Panel (Reflexed)     Status: Abnormal   Collection Time: 11/30/20  7:25 PM  Result Value Ref Range Status   Enterococcus  faecalis NOT DETECTED NOT DETECTED Final   Enterococcus Faecium NOT DETECTED NOT DETECTED Final   Listeria monocytogenes NOT DETECTED NOT DETECTED Final   Staphylococcus species DETECTED (A) NOT DETECTED Final    Comment: CRITICAL RESULT CALLED TO, READ BACK BY AND VERIFIED WITH: Melodye Ped PharmD 13:20 12/01/20 ([email or web address])    Staphylococcus aureus (BCID) DETECTED (A) NOT DETECTED Final    Comment: CRITICAL RESULT CALLED TO, READ BACK BY AND VERIFIED WITH: Melodye Ped PharmD 13:20 12/01/20 ([email or web address])    Staphylococcus epidermidis NOT DETECTED NOT DETECTED Final   Staphylococcus lugdunensis NOT DETECTED NOT DETECTED Final   Streptococcus species NOT DETECTED NOT DETECTED Final   Streptococcus agalactiae NOT DETECTED NOT DETECTED Final   Streptococcus pneumoniae NOT DETECTED NOT DETECTED Final   Streptococcus pyogenes NOT DETECTED NOT DETECTED Final   A.calcoaceticus-baumannii NOT DETECTED NOT DETECTED Final   Bacteroides fragilis NOT DETECTED NOT DETECTED Final   Enterobacterales NOT DETECTED NOT DETECTED Final   Enterobacter cloacae complex NOT DETECTED NOT DETECTED Final   Escherichia coli NOT DETECTED NOT DETECTED Final   Klebsiella aerogenes NOT DETECTED NOT DETECTED Final   Klebsiella oxytoca NOT DETECTED NOT DETECTED Final   Klebsiella pneumoniae NOT DETECTED NOT DETECTED Final   Proteus species NOT DETECTED NOT DETECTED Final   Salmonella species NOT DETECTED NOT DETECTED Final   Serratia marcescens NOT DETECTED NOT DETECTED Final   Haemophilus influenzae NOT DETECTED NOT DETECTED Final   Neisseria meningitidis NOT DETECTED NOT  DETECTED Final   Pseudomonas aeruginosa NOT DETECTED NOT DETECTED Final   Stenotrophomonas maltophilia NOT DETECTED NOT DETECTED Final   Candida albicans NOT DETECTED NOT DETECTED Final   Candida auris NOT DETECTED NOT DETECTED Final   Candida glabrata NOT DETECTED NOT DETECTED Final   Candida krusei NOT DETECTED NOT DETECTED Final   Candida parapsilosis NOT  DETECTED NOT DETECTED Final   Candida tropicalis NOT DETECTED NOT DETECTED Final   Cryptococcus neoformans/gattii NOT DETECTED NOT DETECTED Final   Meth resistant mecA/C and MREJ NOT DETECTED NOT DETECTED Final    Comment: Performed at [facility], [place] [address], [place], [place] [address]  Culture, blood (Routine X 2) w Reflex to ID Panel     Status: Abnormal (Preliminary result)   Collection Time: 11/30/20  8:06 PM   Specimen: BLOOD  Result Value Ref Range Status   Specimen Description   Final    BLOOD BLOOD LEFT HAND Performed at [facility] [address] [place], [place] [address]    Special Requests   Final    BOTTLES DRAWN AEROBIC AND ANAEROBIC Blood Culture results may not be optimal due to an inadequate volume of blood received in culture bottles Performed at [facility] [address] [place], [place] [address]    Culture  Setup Time   Final    GRAM POSITIVE COCCI IN CLUSTERS IN BOTH AEROBIC AND ANAEROBIC BOTTLES CRITICAL VALUE NOTED.  VALUE IS CONSISTENT WITH PREVIOUSLY REPORTED AND CALLED VALUE.    Culture (A)  Final    STAPHYLOCOCCUS AUREUS SUSCEPTIBILITIES PERFORMED ON PREVIOUS CULTURE WITHIN THE LAST 5 DAYS. Performed at [facility], [place] [address] [place],  [address]    Report Status PENDING  Incomplete     Studies: CT Head Wo Contrast  Result Date: 11/30/2020 CLINICAL DATA:  Delirium.  Discharged today. EXAM: CT HEAD WITHOUT CONTRAST TECHNIQUE: Contiguous axial images were obtained from the base of the skull through the vertex without intravenous contrast. COMPARISON:  None. FINDINGS: Brain: Normal for age atrophy. No intracranial hemorrhage, mass effect, or midline shift. No hydrocephalus. The basilar cisterns are patent. No evidence of territorial infarct or acute ischemia. No extra-axial or intracranial fluid collection. Vascular: Atherosclerosis of skullbase vasculature without hyperdense vessel  or abnormal calcification. Skull: No fracture or focal lesion. Sinuses/Orbits: Minor mucosal thickening of ethmoid air cells. No sinus air-fluid levels. Mastoid air cells are clear. Orbits are unremarkable. Other: None. IMPRESSION: 1. No acute intracranial abnormality. 2. Normal for age atrophy. Electronically Signed   By: Keith Rake M.D.   On: 11/30/2020 21:53   CT Angio Chest PE W and/or Wo Contrast  Result Date: 11/30/2020 CLINICAL DATA:  Pain and fever. EXAM: CT ANGIOGRAPHY CHEST CT ABDOMEN AND PELVIS WITH CONTRAST TECHNIQUE: Multidetector CT imaging of the chest was performed using the standard protocol during bolus administration of intravenous contrast. Multiplanar CT image reconstructions and MIPs were obtained to evaluate the vascular anatomy. Multidetector CT imaging of the abdomen and pelvis was performed using the standard protocol during bolus administration of intravenous contrast. CONTRAST:  163mL OMNIPAQUE IOHEXOL 350 MG/ML SOLN COMPARISON:  CT dated November 30, 2020 FINDINGS: CTA CHEST FINDINGS Cardiovascular: Contrast injection is sufficient to demonstrate satisfactory opacification of the pulmonary arteries to the segmental level. There is no pulmonary embolus or evidence of right heart strain. The size of the main pulmonary artery is normal. Heart size is normal, with no pericardial effusion. The course and caliber  of the aorta are normal. There is mild atherosclerotic calcification. Opacification decreased due to pulmonary arterial phase contrast bolus timing. Mediastinum/Nodes: -- No mediastinal lymphadenopathy. -- No hilar lymphadenopathy. -- No axillary lymphadenopathy. -- No supraclavicular lymphadenopathy. -- Normal thyroid gland where visualized. -  Unremarkable esophagus. Lungs/Pleura: Airways are patent. No pleural effusion, lobar consolidation, pneumothorax or pulmonary infarction. Musculoskeletal: There are old left-sided rib fractures. No acute displaced fracture. Review of  the MIP images confirms the above findings. CT ABDOMEN and PELVIS FINDINGS Hepatobiliary: The liver is normal. Normal gallbladder.There is no biliary ductal dilation. Pancreas: Normal contours without ductal dilatation. No peripancreatic fluid collection. Spleen: Unremarkable. Adrenals/Urinary Tract: --Adrenal glands: Unremarkable. --Right kidney/ureter: There is a nonobstructing 4 mm stone in the interpolar region of the right kidney. --Left kidney/ureter: No hydronephrosis or radiopaque kidney stones. --Urinary bladder: The urinary bladder is distended. Stomach/Bowel: --Stomach/Duodenum: No hiatal hernia or other gastric abnormality. Normal duodenal course and caliber. --Small bowel: Unremarkable. --Colon: Unremarkable. --Appendix: Normal. Vascular/Lymphatic: Atherosclerotic calcification is present within the non-aneurysmal abdominal aorta, without hemodynamically significant stenosis. --No retroperitoneal lymphadenopathy. --No mesenteric lymphadenopathy. --No pelvic or inguinal lymphadenopathy. Reproductive: Status post hysterectomy. No adnexal mass. Other: No ascites or free air. The abdominal wall is normal. Musculoskeletal. Compression fractures are again noted of the lumbar spine, not substantially changed from prior study. Review of the MIP images confirms the above findings. IMPRESSION: 1. No acute abnormality. 2. Nonobstructing right nephrolithiasis. 3. Distended urinary bladder. Aortic Atherosclerosis (ICD10-I70.0). Electronically Signed   By: Constance Holster M.D.   On: 11/30/2020 21:56   CT ABDOMEN PELVIS W CONTRAST  Result Date: 11/30/2020 CLINICAL DATA:  Pain and fever. EXAM: CT ANGIOGRAPHY CHEST CT ABDOMEN AND PELVIS WITH CONTRAST TECHNIQUE: Multidetector CT imaging of the chest was performed using the standard protocol during bolus administration of intravenous contrast. Multiplanar CT image reconstructions and MIPs were obtained to evaluate the vascular anatomy. Multidetector CT imaging of  the abdomen and pelvis was performed using the standard protocol during bolus administration of intravenous contrast. CONTRAST:  161mL OMNIPAQUE IOHEXOL 350 MG/ML SOLN COMPARISON:  CT dated November 30, 2020 FINDINGS: CTA CHEST FINDINGS Cardiovascular: Contrast injection is sufficient to demonstrate satisfactory opacification of the pulmonary arteries to the segmental level. There is no pulmonary embolus or evidence of right heart strain. The size of the main pulmonary artery is normal. Heart size is normal, with no pericardial effusion. The course and caliber of the aorta are normal. There is mild atherosclerotic calcification. Opacification decreased due to pulmonary arterial phase contrast bolus timing. Mediastinum/Nodes: -- No mediastinal lymphadenopathy. -- No hilar lymphadenopathy. -- No axillary lymphadenopathy. -- No supraclavicular lymphadenopathy. -- Normal thyroid gland where visualized. -  Unremarkable esophagus. Lungs/Pleura: Airways are patent. No pleural effusion, lobar consolidation, pneumothorax or pulmonary infarction. Musculoskeletal: There are old left-sided rib fractures. No acute displaced fracture. Review of the MIP images confirms the above findings. CT ABDOMEN and PELVIS FINDINGS Hepatobiliary: The liver is normal. Normal gallbladder.There is no biliary ductal dilation. Pancreas: Normal contours without ductal dilatation. No peripancreatic fluid collection. Spleen: Unremarkable. Adrenals/Urinary Tract: --Adrenal glands: Unremarkable. --Right kidney/ureter: There is a nonobstructing 4 mm stone in the interpolar region of the right kidney. --Left kidney/ureter: No hydronephrosis or radiopaque kidney stones. --Urinary bladder: The urinary bladder is distended. Stomach/Bowel: --Stomach/Duodenum: No hiatal hernia or other gastric abnormality. Normal duodenal course and caliber. --Small bowel: Unremarkable. --Colon: Unremarkable. --Appendix: Normal. Vascular/Lymphatic: Atherosclerotic calcification  is present within the non-aneurysmal abdominal aorta, without hemodynamically significant stenosis. --No retroperitoneal lymphadenopathy. --No  mesenteric lymphadenopathy. --No pelvic or inguinal lymphadenopathy. Reproductive: Status post hysterectomy. No adnexal mass. Other: No ascites or free air. The abdominal wall is normal. Musculoskeletal. Compression fractures are again noted of the lumbar spine, not substantially changed from prior study. Review of the MIP images confirms the above findings. IMPRESSION: 1. No acute abnormality. 2. Nonobstructing right nephrolithiasis. 3. Distended urinary bladder. Aortic Atherosclerosis (ICD10-I70.0). Electronically Signed   By: Constance Holster M.D.   On: 11/30/2020 21:56   DG Chest Port 1 View  Result Date: 11/30/2020 CLINICAL DATA:  Fever, hypokalemia, altered mental status EXAM: PORTABLE CHEST 1 VIEW COMPARISON:  CT 02/09/2012, radiograph 02/09/2012 FINDINGS: Low lung volumes with some hazy opacities atelectasis or pulmonary edema given the presence of pulmonary vascular congestion and fissural thickening. Prominence of the cardiomediastinal silhouette may be accentuated by the portable technique and/or low volumes. The aorta is calcified. The remaining cardiomediastinal contours are unremarkable. No acute osseous or soft tissue abnormality. Degenerative changes are present in the imaged spine and shoulders. IMPRESSION: Low lung volumes with hazy opacities likely reflect atelectasis or early edema given pulmonary vascular congestion. Underlying infection difficult to exclude in the setting of the fever. Electronically Signed   By: Lovena Le M.D.   On: 11/30/2020 19:22   CT Renal Stone Study  Result Date: 11/30/2020 CLINICAL DATA:  Flank pain EXAM: CT ABDOMEN AND PELVIS WITHOUT CONTRAST TECHNIQUE: Multidetector CT imaging of the abdomen and pelvis was performed following the standard protocol without IV contrast. COMPARISON:  CT abdomen dated 01/17/2019  FINDINGS: Lower chest: No acute abnormality. Hepatobiliary: No focal liver abnormality is seen. Gallbladder is unremarkable. No bile duct dilatation. Pancreas: Unremarkable. No pancreatic ductal dilatation or surrounding inflammatory changes. Spleen: Normal in size without focal abnormality. Adrenals/Urinary Tract: Adrenal glands are unremarkable. 4 mm RIGHT renal stone. Kidneys otherwise unremarkable without hydronephrosis or perinephric inflammation. No ureteral or bladder calculi. Bladder is unremarkable. Stomach/Bowel: No dilated large or small bowel loops. No evidence of bowel wall inflammation. Appendix is not seen but there are no inflammatory changes about the cecum to suggest acute appendicitis. Stomach is unremarkable, partially decompressed. Vascular/Lymphatic: Aortic atherosclerosis. No enlarged lymph nodes are seen within the abdomen or pelvis. Reproductive: Presumed hysterectomy.  No adnexal mass or free fluid. Other: No free fluid or abscess collection. No free intraperitoneal air. Musculoskeletal: No acute-appearing osseous abnormality. Stable chronic compression fracture deformities of the L1, L3 and L5 vertebral bodies. IMPRESSION: 1. No acute findings within the abdomen or pelvis. No bowel obstruction or evidence of bowel wall inflammation. No evidence of acute solid organ abnormality. No free fluid or inflammatory change. 2. 4 mm nonobstructing RIGHT renal stone. No ureteral or bladder calculi. 3. Chronic compression fracture deformities of the L1, L3 and L5 vertebral bodies, not significantly changed compared to earlier CT of 01/17/2019. Aortic Atherosclerosis (ICD10-I70.0). Electronically Signed   By: Franki Cabot M.D.   On: 11/30/2020 12:42      Flora Lipps, MD  [facility] 12/02/2020  If 7PM-7AM, please contact night-coverage

## 2020-12-02 NOTE — Evaluation (Signed)
Physical Therapy Evaluation Patient Details Name: [name]: [id number] DOB: [birth year] Today's Date: 12/02/2020   History of Present Illness  75 y.o. female with past medical history of hypothyroidism and depression presented to the hospital with cough and not feeling well for the last 2 weeks.  Patient had presented to the ED a day before this admission for dysuria and a CT scan of the abdomen was done which showed nothing acute.  He was then discharged home but during the evening patient was noted to be more febrile and confused and was brought into the back to the hospital.   In the ED, patient was noted to have temperature 103 F, tachycardic with elevated lactic acid levels features concerning for sepsis.  Pt admitted with MSSA bacteremia/sepsis  Clinical Impression  Pt admitted with above diagnosis.  Pt currently with functional limitations due to the deficits listed below (see PT Problem List). Pt will benefit from skilled PT to increase their independence and safety with mobility to allow discharge to the venue listed below.  Pt assisted with OOB to recliner and requiring mod assist for bed mobility and min assist for transfers.  Discussed SNF recommendation and concern for safety with d/c home alone.  Pt prefers d/c home believing she will perform better once home.  Pt pleasant and cooperative, just prefers d/c home.  Will continue to assist with safely mobilizing during acute stay.  Pt reports history of falls at home.  She also states she was going to outpatient therapy prior to admission.   Follow Up Recommendations SNF    Equipment Recommendations  Rolling walker with 5" wheels (pt reports she has a walker however poor description so RW if home)    Recommendations for Other Services       Precautions / Restrictions Precautions Precautions: Fall Precaution Comments: urinary incontinence      Mobility  Bed Mobility Overal bed mobility: Needs Assistance Bed Mobility:  Supine to Sit     Supine to sit: Mod assist     General bed mobility comments: assist for LEs over EOB and trunk upright, pt attempted to self assist as able    Transfers Overall transfer level: Needs assistance Equipment used: Rolling walker (2 wheeled) Transfers: Sit to/from [facility] Sit to Stand: Min assist Stand pivot transfers: Min assist       General transfer comment: assist to rise and steady, cues for use of RW  Ambulation/Gait             General Gait Details: pt declined today  Stairs            Wheelchair Mobility    Modified Rankin (Stroke Patients Only)       Balance Overall balance assessment: History of Falls                                           Pertinent Vitals/Pain Pain Assessment: Faces Faces Pain Scale: Hurts a little bit Pain Location: back Pain Descriptors / Indicators: Sore Pain Intervention(s): Repositioned;Monitored during session    Home Living Family/patient expects to be discharged to:: Private residence Living Arrangements: Alone   Type of Home: House Home Access: Stairs to enter Entrance Stairs-Rails: Right Entrance Stairs-Number of Steps: 4 Home Layout: One level Home Equipment: Walker - 2 wheels      Prior Function Level of Independence: Independent  Hand Dominance        Extremity/Trunk Assessment        Lower Extremity Assessment Lower Extremity Assessment: Generalized weakness       Communication   Communication: No difficulties  Cognition Arousal/Alertness: Awake/alert Behavior During Therapy: WFL for tasks assessed/performed Overall Cognitive Status: No family/caregiver present to determine baseline cognitive functioning                                 General Comments: pt reports poor memory lately, follows commands, appropriate conversations      General Comments      Exercises     Assessment/Plan    PT  Assessment Patient needs continued PT services  PT Problem List Decreased strength;Decreased mobility;Decreased activity tolerance;Decreased balance;Decreased knowledge of use of DME       PT Treatment Interventions DME instruction;Gait training;Balance training;Therapeutic exercise;Functional mobility training;Therapeutic activities;Patient/family education    PT Goals (Current goals can be found in the Care Plan section)  Acute Rehab PT Goals PT Goal Formulation: With patient Time For Goal Achievement: 12/16/20 Potential to Achieve Goals: Good    Frequency Min 2X/week   Barriers to discharge        Co-evaluation               AM-PAC PT "6 Clicks" Mobility  Outcome Measure Help needed turning from your back to your side while in a flat bed without using bedrails?: A Lot Help needed moving from lying on your back to sitting on the side of a flat bed without using bedrails?: A Lot Help needed moving to and from a bed to a chair (including a wheelchair)?: A Little Help needed standing up from a chair using your arms (e.g., wheelchair or bedside chair)?: A Little Help needed to walk in hospital room?: A Little Help needed climbing 3-5 steps with a railing? : A Lot 6 Click Score: 15    End of Session Equipment Utilized During Treatment: Gait belt Activity Tolerance: Patient tolerated treatment well Patient left: in chair;with call bell/phone within reach;with chair alarm set   PT Visit Diagnosis: Difficulty in walking, not elsewhere classified (R26.2);Muscle weakness (generalized) (M62.81);History of falling (Z91.81)    Time: 4403-4742 PT Time Calculation (min) (ACUTE ONLY): 20 min   Charges:   PT Evaluation $PT Eval Low Complexity: 1 Low     Kati PT, DPT Acute Rehabilitation Services Pager: [phone] Office: [phone]  York Ram E 12/02/2020, 3:55 PM

## 2020-12-03 DIAGNOSIS — R509 Fever, unspecified: Secondary | ICD-10-CM | POA: Diagnosis present

## 2020-12-03 DIAGNOSIS — R7881 Bacteremia: Secondary | ICD-10-CM | POA: Diagnosis not present

## 2020-12-03 DIAGNOSIS — G9340 Encephalopathy, unspecified: Secondary | ICD-10-CM

## 2020-12-03 DIAGNOSIS — B9561 Methicillin susceptible Staphylococcus aureus infection as the cause of diseases classified elsewhere: Secondary | ICD-10-CM | POA: Diagnosis not present

## 2020-12-03 DIAGNOSIS — A4101 Sepsis due to Methicillin susceptible Staphylococcus aureus: Secondary | ICD-10-CM | POA: Diagnosis present

## 2020-12-03 LAB — BASIC METABOLIC PANEL
Anion gap: 13 (ref 5–15)
BUN: 12 mg/dL (ref 8–23)
CO2: 22 mmol/L (ref 22–32)
Calcium: 8.9 mg/dL (ref 8.9–10.3)
Chloride: 103 mmol/L (ref 98–111)
Creatinine, Ser: 0.44 mg/dL (ref 0.44–1.00)
GFR, Estimated: >60 mL/min (ref >60)
Glucose, Bld: 119 mg/dL — ABNORMAL HIGH (ref 70–99)
Potassium: 3.9 mmol/L (ref 3.5–5.1)
Sodium: 138 mmol/L (ref 135–145)

## 2020-12-03 LAB — CULTURE, BLOOD (ROUTINE X 2)

## 2020-12-03 LAB — CBC
HCT: 33.9 % — ABNORMAL LOW (ref 36.0–46.0)
Hemoglobin: 11.1 g/dL — ABNORMAL LOW (ref 12.0–15.0)
MCH: 32.1 pg (ref 26.0–34.0)
MCHC: 32.7 g/dL (ref 30.0–36.0)
MCV: 98 fL (ref 80.0–100.0)
Platelets: 186 10*3/uL (ref 150–400)
RBC: 3.46 MIL/uL — ABNORMAL LOW (ref 3.87–5.11)
RDW: 14.4 % (ref 11.5–15.5)
WBC: 6.4 10*3/uL (ref 4.0–10.5)
nRBC: 0 % (ref 0.0–0.2)

## 2020-12-03 LAB — MAGNESIUM: Magnesium: 2 mg/dL (ref 1.7–2.4)

## 2020-12-03 LAB — FOLATE: Folate: 37.8 ng/mL (ref >5.9)

## 2020-12-03 LAB — PHOSPHORUS: Phosphorus: 2.9 mg/dL (ref 2.5–4.6)

## 2020-12-03 LAB — VITAMIN B12: Vitamin B-12: 3034 pg/mL — ABNORMAL HIGH (ref 180–914)

## 2020-12-03 NOTE — Progress Notes (Signed)
Physical Therapy Treatment Patient Details Name: [name]: [id number] DOB: 1945-12-13 Today's Date: 12/03/2020    History of Present Illness 75 y.o. female with past medical history of hypothyroidism and depression presented to the hospital with cough and not feeling well for the last 2 weeks.  Patient had presented to the ED a day before this admission for dysuria and a CT scan of the abdomen was done which showed nothing acute.  He was then discharged home but during the evening patient was noted to be more febrile and confused and was brought into the back to the hospital.   In the ED, patient was noted to have temperature 103 F, tachycardic with elevated lactic acid levels features concerning for sepsis.  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 Pt able to state month, year, president correctly, know she is in hospital but unsure the name of it. Pt able to transfer to EOB with min A, rises to stand and pivot over to [facility] with min A. After toileting completed, pt complains of L low back pain, able to stand and take a few steps at bedside before sitting in chair. Pt fatigues with mobility, limited by low back pain this session- RN notified. Pt will benefit from continued physical therapy in hospital and recommendations below to increase strength, balance, endurance for safe ADLs and gait.     Follow Up Recommendations  SNF     Equipment Recommendations  Rolling walker with 5" wheels (pt reports she has a walker however poor description so RW if home)    Recommendations for Other Services       Precautions / Restrictions Precautions Precautions: Fall Precaution Comments: urinary incontinence Restrictions Weight Bearing Restrictions: No    Mobility  Bed Mobility Overal bed mobility: Needs Assistance Bed Mobility: Supine to Sit  Supine to sit: Min assist  General  bed mobility comments: min A to upright trunk and scoot out to EOB, slow movement  Transfers Overall transfer level: Needs assistance Equipment used: Rolling walker (2 wheeled) Transfers: Sit to/from [facility] Sit to Stand: Min assist Stand pivot transfers: Min assist  General transfer comment: min A to power up, cues for hand placement and RW management to improve safety with pivot over to [facility]  Ambulation/Gait Ambulation/Gait assistance: Min assist  Assistive device: Rolling walker (2 wheeled)  General Gait Details: Pt able to take a few short steps at bedside, limited due to L low back pain   Stairs             Wheelchair Mobility    Modified Rankin (Stroke Patients Only)       Balance Overall balance assessment: History of Falls;Needs assistance Sitting-balance support: Feet supported Sitting balance-Leahy Scale: Fair Sitting balance - Comments: seated EOB   Standing balance support: During functional activity;Bilateral upper extremity supported Standing balance-Leahy Scale: Poor Standing balance comment: reliant on UE support       Cognition Arousal/Alertness: Awake/alert Behavior During Therapy: WFL for tasks assessed/performed Overall Cognitive Status: No family/caregiver present to determine baseline cognitive functioning  General Comments: Pt follows commands and appropriate in conversations, appears confused at moments thinking this therapist 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      Exercises General Exercises - Lower Extremity Long Arc Quad: Seated;AROM;Strengthening;Both;10 reps    General Comments  Pertinent Vitals/Pain Pain Assessment: Faces Faces Pain Scale: Hurts whole lot Pain Location: L lower back Pain Descriptors / Indicators: Sore Pain Intervention(s): Limited activity within patient's tolerance;Monitored during session;Repositioned    Home Living Family/patient expects to be discharged to::  Skilled nursing facility Living Arrangements: Alone   Type of Home: House Home Access: Stairs to enter Entrance Stairs-Rails: Right Home Layout: One level Home Equipment: Environmental consultant - 2 wheels      Prior Function Level of Independence: Independent      Comments: Patient independent and working at [employer] last week. Has been having chronic back pain for last 6 months that has been limiting her. Recently started pain medicine and the hope was to be assessed for potential surgery.   PT Goals (current goals can now be found in the care plan section) Acute Rehab PT Goals Patient Stated Goal: to go home PT Goal Formulation: With patient Time For Goal Achievement: 12/16/20 Potential to Achieve Goals: Good Progress towards PT goals: Progressing toward goals    Frequency    Min 2X/week      PT Plan Current plan remains appropriate    Co-evaluation PT/OT/SLP Co-Evaluation/Treatment: Yes Reason for Co-Treatment: For patient/therapist safety;To address functional/ADL transfers PT goals addressed during session: Mobility/safety with mobility;Balance;Proper use of DME OT goals addressed during session: ADL's and self-care (co-eval)      AM-PAC PT "6 Clicks" Mobility   Outcome Measure  Help needed turning from your back to your side while in a flat bed without using bedrails?: A Lot Help needed moving from lying on your back to sitting on the side of a flat bed without using bedrails?: A Lot Help needed moving to and from a bed to a chair (including a wheelchair)?: A Little Help needed standing up from a chair using your arms (e.g., wheelchair or bedside chair)?: A Little Help needed to walk in hospital room?: A Little Help needed climbing 3-5 steps with a railing? : A Lot 6 Click Score: 15    End of Session Equipment Utilized During Treatment: Gait belt Activity Tolerance: Patient tolerated treatment well Patient left: in chair;with call bell/phone within reach;with chair  alarm set Nurse Communication: Mobility status;Other (comment) (L low back pain) PT Visit Diagnosis: Difficulty in walking, not elsewhere classified (R26.2);Muscle weakness (generalized) (M62.81);History of falling (Z91.81)     Time: 9528-4132 PT Time Calculation (min) (ACUTE ONLY): 19 min  Charges:  $Therapeutic Activity: 8-22 mins                      Tori Alesi Zachery PT, DPT 12/03/20, 1:44 PM

## 2020-12-03 NOTE — H&P (View-Only) (Signed)
INFECTIOUS DISEASE PROGRESS NOTE  ID: [name] is a 75 y.o. female with  Principal Problem:   SIRS (systemic inflammatory response syndrome) (HCC) Active Problems:   Hyperlipidemia   Malignant neoplasm of anal canal (HCC)   Depression   History of radiation therapy   Lumbar radiculopathy  Subjective: Temp 102 twice yesterday She wants to go home and she her grandson.   Abtx:  Anti-infectives (From admission, onward)   Start     Dose/Rate Route Frequency Ordered Stop   12/01/20 2200  ceFAZolin (ANCEF) IVPB 2g/100 mL premix        2 g 200 mL/hr over 30 Minutes Intravenous Every 8 hours 12/01/20 1347     12/01/20 2000  vancomycin (VANCOREADY) IVPB 1250 mg/250 mL  Status:  Discontinued        1,250 mg 166.7 mL/hr over 90 Minutes Intravenous Every 24 hours 12/01/20 0005 12/01/20 1347   12/01/20 0400  ceFEPIme (MAXIPIME) 2 g in sodium chloride 0.9 % 100 mL IVPB  Status:  Discontinued        2 g 200 mL/hr over 30 Minutes Intravenous Every 8 hours 11/30/20 2230 12/01/20 1347   11/30/20 2245  metroNIDAZOLE (FLAGYL) IVPB 500 mg  Status:  Discontinued        500 mg 100 mL/hr over 60 Minutes Intravenous Every 8 hours 11/30/20 2230 12/01/20 1347   11/30/20 2245  vancomycin (VANCOCIN) IVPB 1000 mg/200 mL premix  Status:  Discontinued        1,000 mg 200 mL/hr over 60 Minutes Intravenous  Once 11/30/20 2230 11/30/20 2237   11/30/20 1900  vancomycin (VANCOREADY) IVPB 1750 mg/350 mL        1,750 mg 175 mL/hr over 120 Minutes Intravenous STAT 11/30/20 1850 11/30/20 2231   11/30/20 1900  ceFEPIme (MAXIPIME) 2 g in sodium chloride 0.9 % 100 mL IVPB        2 g 200 mL/hr over 30 Minutes Intravenous STAT 11/30/20 1850 11/30/20 2023      Medications:  Scheduled: . amLODipine  5 mg Oral Daily  . ARIPiprazole  10 mg Oral Daily  . DULoxetine  120 mg Oral Daily  . enoxaparin (LOVENOX) injection  40 mg Subcutaneous Q24H  . folic acid  [id number] mcg Oral Daily  . gabapentin  600 mg Oral BID   . levothyroxine  25 mcg Oral Q0600  . pravastatin  40 mg Oral Q breakfast  . traZODone  100 mg Oral QHS  . vitamin B-12  500 mcg Oral Daily    Objective: Vital signs in last 24 hours: Temp:  [98.7 F (37.1 C)-102.4 F (39.1 C)] 98.7 F (37.1 C) (02/02 0508) Pulse Rate:  [93-99] 94 (02/02 0508) Resp:  [17-20] 20 (02/02 0508) BP: (147-157)/(72-92) 149/92 (02/02 0508) SpO2:  [96 %-97 %] 97 % (02/02 0508)   General appearance: alert, cooperative and no distress Resp: clear to auscultation bilaterally Cardio: regular rate and rhythm GI: normal findings: bowel sounds normal and soft, non-tender  Lab Results Recent Labs    12/02/20 0540 12/03/20 0550  WBC 8.3 6.4  HGB 11.1* 11.1*  HCT 35.7* 33.9*  NA 141 138  K 4.6 3.9  CL 112* 103  CO2 18* 22  BUN 16 12  CREATININE 0.70 0.44   Liver Panel Recent Labs    12/01/20 0255  PROT 6.3*  ALBUMIN 3.3*  AST 31  ALT 25  ALKPHOS 55  BILITOT 0.7   Sedimentation Rate No results for input(s):  ESRSEDRATE in the last 72 hours. C-Reactive Protein No results for input(s): CRP in the last 72 hours.  Microbiology: Recent Results (from the past 240 hour(s))  Urine culture     Status: None   Collection Time: 11/30/20  1:44 PM   Specimen: Urine, Random  Result Value Ref Range Status   Specimen Description   Final    URINE, RANDOM Performed at [facility] [address] [place], [place] [address]    Special Requests   Final    NONE Performed at [facility], [place] [address], [place], [place] [address]    Culture   Final    NO GROWTH Performed at [facility], [place] [address] [place], [place] [address]    Report Status 12/01/2020 FINAL  Final  Culture, blood (routine x 2)     Status: Abnormal   Collection Time: 11/30/20  7:25 PM   Specimen: BLOOD  Result Value Ref Range Status   Specimen Description   Final    BLOOD BLOOD LEFT FOREARM Performed at [facility] [address] [place], [place] [address]    Special Requests   Final    BOTTLES DRAWN AEROBIC AND ANAEROBIC Blood Culture results may not be optimal due to an inadequate volume of blood received in culture bottles   Culture  Setup Time   Final    GRAM POSITIVE COCCI IN CLUSTERS IN BOTH AEROBIC AND ANAEROBIC BOTTLES CRITICAL RESULT CALLED TO, READ BACK BY AND VERIFIED WITH: Melodye Ped PharmD 13:20 12/01/20 ([email or web address]) Performed at [facility], [place] [address] [place], [place] [address]    Culture STAPHYLOCOCCUS AUREUS (A)  Final   Report Status 12/03/2020 FINAL  Final   Organism ID, Bacteria STAPHYLOCOCCUS AUREUS  Final      Susceptibility   Staphylococcus aureus - MIC*    CIPROFLOXACIN <=0.5 SENSITIVE Sensitive     ERYTHROMYCIN <=0.25 SENSITIVE Sensitive     GENTAMICIN <=0.5 SENSITIVE Sensitive     OXACILLIN <=0.25 SENSITIVE Sensitive     TETRACYCLINE <=1 SENSITIVE Sensitive     VANCOMYCIN 1 SENSITIVE Sensitive     TRIMETH/SULFA <=10 SENSITIVE Sensitive     CLINDAMYCIN <=0.25 SENSITIVE Sensitive     RIFAMPIN <=0.5 SENSITIVE Sensitive     Inducible Clindamycin NEGATIVE Sensitive     * STAPHYLOCOCCUS AUREUS  SARS Coronavirus 2 by RT PCR (hospital order, performed in [place] hospital lab) Nasopharyngeal Nasopharyngeal Swab     Status: None   Collection Time: 11/30/20  7:25 PM   Specimen: Nasopharyngeal Swab  Result Value Ref Range Status   SARS Coronavirus 2 NEGATIVE NEGATIVE Final    Comment: (NOTE) SARS-CoV-2 target nucleic acids are NOT DETECTED.  The SARS-CoV-2 RNA is generally detectable in upper and lower respiratory specimens during the acute phase of infection. The lowest concentration of SARS-CoV-2 viral copies this assay can detect is 250 copies / mL. A negative result does not preclude SARS-CoV-2 infection and should not be used as the sole basis for treatment or other patient management decisions.  A negative result may occur with improper specimen  collection / handling, submission of specimen other than nasopharyngeal swab, presence of viral mutation(s) within the areas targeted by this assay, and inadequate number of viral copies (<250 copies / mL). A negative result must be combined with clinical observations, patient history, and epidemiological information.  Fact Sheet for Patients:   StrictlyIdeas.no  Fact Sheet for Healthcare Providers: BankingDealers.co.za  This test is not yet approved or  cleared by the [place] and has been authorized for detection and/or diagnosis of SARS-CoV-2 by FDA under an Emergency Use Authorization (EUA).  This EUA will remain in effect (meaning this test can be used) for the duration of the COVID-19 declaration under Section 564(b)(1) of the Act, 21 U.S.C. section 360bbb-3(b)(1), unless the authorization is terminated or revoked sooner.  Performed at [facility], [place] [address], [place], [place] [address]   Blood Culture ID Panel (Reflexed)     Status: Abnormal   Collection Time: 11/30/20  7:25 PM  Result Value Ref Range Status   Enterococcus faecalis NOT DETECTED NOT DETECTED Final   Enterococcus Faecium NOT DETECTED NOT DETECTED Final   Listeria monocytogenes NOT DETECTED NOT DETECTED Final   Staphylococcus species DETECTED (A) NOT DETECTED Final    Comment: CRITICAL RESULT CALLED TO, READ BACK BY AND VERIFIED WITH: Melodye Ped PharmD 13:20 12/01/20 ([email or web address])    Staphylococcus aureus (BCID) DETECTED (A) NOT DETECTED Final    Comment: CRITICAL RESULT CALLED TO, READ BACK BY AND VERIFIED WITH: Melodye Ped PharmD 13:20 12/01/20 ([email or web address])    Staphylococcus epidermidis NOT DETECTED NOT DETECTED Final   Staphylococcus lugdunensis NOT DETECTED NOT DETECTED Final   Streptococcus species NOT DETECTED NOT DETECTED Final   Streptococcus agalactiae NOT DETECTED NOT DETECTED Final   Streptococcus pneumoniae NOT DETECTED NOT  DETECTED Final   Streptococcus pyogenes NOT DETECTED NOT DETECTED Final   A.calcoaceticus-baumannii NOT DETECTED NOT DETECTED Final   Bacteroides fragilis NOT DETECTED NOT DETECTED Final   Enterobacterales NOT DETECTED NOT DETECTED Final   Enterobacter cloacae complex NOT DETECTED NOT DETECTED Final   Escherichia coli NOT DETECTED NOT DETECTED Final   Klebsiella aerogenes NOT DETECTED NOT DETECTED Final   Klebsiella oxytoca NOT DETECTED NOT DETECTED Final   Klebsiella pneumoniae NOT DETECTED NOT DETECTED Final   Proteus species NOT DETECTED NOT DETECTED Final   Salmonella species NOT DETECTED NOT DETECTED Final   Serratia marcescens NOT DETECTED NOT DETECTED Final   Haemophilus influenzae NOT DETECTED NOT DETECTED Final   Neisseria meningitidis NOT DETECTED NOT DETECTED Final   Pseudomonas aeruginosa NOT DETECTED NOT DETECTED Final   Stenotrophomonas maltophilia NOT DETECTED NOT DETECTED Final   Candida albicans NOT DETECTED NOT DETECTED Final   Candida auris NOT DETECTED NOT DETECTED Final   Candida glabrata NOT DETECTED NOT DETECTED Final   Candida krusei NOT DETECTED NOT DETECTED Final   Candida parapsilosis NOT DETECTED NOT DETECTED Final   Candida tropicalis NOT DETECTED NOT DETECTED Final   Cryptococcus neoformans/gattii NOT DETECTED NOT DETECTED Final   Meth resistant mecA/C and MREJ NOT DETECTED NOT DETECTED Final    Comment: Performed at [facility], [address] [place], [place] [address]  Culture, blood (Routine X 2) w Reflex to ID Panel     Status: Abnormal   Collection Time: 11/30/20  8:06 PM   Specimen: BLOOD  Result Value Ref Range Status   Specimen Description   Final    BLOOD BLOOD LEFT HAND Performed at [facility] [address] [place], [place] [address]    Special Requests   Final    BOTTLES DRAWN AEROBIC AND ANAEROBIC Blood Culture results may not be optimal due to an inadequate volume of blood received in culture  bottles Performed at [facility] [address], [place], [place] [address]    Culture  Setup Time   Final  GRAM POSITIVE COCCI IN CLUSTERS IN BOTH AEROBIC AND ANAEROBIC BOTTLES CRITICAL VALUE NOTED.  VALUE IS CONSISTENT WITH PREVIOUSLY REPORTED AND CALLED VALUE.    Culture (A)  Final    STAPHYLOCOCCUS AUREUS SUSCEPTIBILITIES PERFORMED ON PREVIOUS CULTURE WITHIN THE LAST 5 DAYS. Performed at [facility], [place] [address], [place], [place] [address]    Report Status 12/03/2020 FINAL  Final  Culture, blood (Routine X 2) w Reflex to ID Panel     Status: None (Preliminary result)   Collection Time: 12/02/20  5:40 AM   Specimen: BLOOD RIGHT HAND  Result Value Ref Range Status   Specimen Description   Final    BLOOD RIGHT HAND Performed at [facility] [address] [place], [place] [address]    Special Requests   Final    BOTTLES DRAWN AEROBIC ONLY Blood Culture results may not be optimal due to an inadequate volume of blood received in culture bottles Performed at [facility] [address], [place], [place] [address]    Culture   Final    NO GROWTH 1 DAY Performed at [facility], [place] [address] [place], [place] [address]    Report Status PENDING  Incomplete    Studies/Results: ECHOCARDIOGRAM COMPLETE  Result Date: 12/02/2020    ECHOCARDIOGRAM REPORT   Patient Name:   [name] Date of Exam: 12/02/2020 Medical Rec #:  [id number]       Height:       65.0 in Accession #:    [id number]      Weight:       176.1 lb Date of Birth:  November 20, 1945       BSA:          1.874 m Patient Age:    55 years        BP:           146/79 mmHg Patient Gender: F               HR:           92 bpm. Exam Location:  Inpatient Procedure: 2D Echo, Cardiac Doppler and Color Doppler Indications:    Bacteremia  History:        Patient has no prior history of Echocardiogram examinations.                 Signs/Symptoms:Bacteremia and  Fever; Risk Factors:Hypertension.                 Sepsis.  Sonographer:    Dustin Flock Referring Phys: [phone] [place]  1. Left ventricular ejection fraction, by estimation, is 55 to 60%. The left ventricle has normal function. The left ventricle has no regional wall motion abnormalities. Left ventricular diastolic parameters are consistent with Grade I diastolic dysfunction (impaired relaxation).  2. Right ventricular systolic function is normal. The right ventricular size is normal.  3. The mitral valve is normal in structure. No evidence of mitral valve regurgitation. No evidence of mitral stenosis.  4. The aortic valve is normal in structure. Aortic valve regurgitation is not visualized. No aortic stenosis is present.  5. The inferior vena cava is normal in size with greater than 50% respiratory variability, suggesting right atrial pressure of 3 mmHg. Conclusion(s)/Recommendation(s): No evidence of valvular vegetations on this transthoracic echocardiogram. Would recommend a transesophageal echocardiogram to exclude infective endocarditis if clinically indicated. FINDINGS  Left Ventricle: Left ventricular ejection fraction, by estimation, is 55 to  60%. The left ventricle has normal function. The left ventricle has no regional wall motion abnormalities. The left ventricular internal cavity size was normal in size. There is  no left ventricular hypertrophy. Left ventricular diastolic parameters are consistent with Grade I diastolic dysfunction (impaired relaxation). Indeterminate filling pressures. Right Ventricle: The right ventricular size is normal. No increase in right ventricular wall thickness. Right ventricular systolic function is normal. Left Atrium: Left atrial size was normal in size. Right Atrium: Right atrial size was normal in size. Pericardium: There is no evidence of pericardial effusion. Mitral Valve: The mitral valve is normal in structure. No evidence of mitral valve  regurgitation. No evidence of mitral valve stenosis. Tricuspid Valve: The tricuspid valve is normal in structure. Tricuspid valve regurgitation is not demonstrated. No evidence of tricuspid stenosis. Aortic Valve: The aortic valve is normal in structure. Aortic valve regurgitation is not visualized. No aortic stenosis is present. Pulmonic Valve: The pulmonic valve was normal in structure. Pulmonic valve regurgitation is not visualized. No evidence of pulmonic stenosis. Aorta: The aortic root is normal in size and structure. Venous: The inferior vena cava is normal in size with greater than 50% respiratory variability, suggesting right atrial pressure of 3 mmHg. IAS/Shunts: No atrial level shunt detected by color flow Doppler.  LEFT VENTRICLE PLAX 2D LVIDd:         4.50 cm  Diastology LVIDs:         2.80 cm  LV e' medial:    5.77 cm/s LV PW:         1.10 cm  LV E/e' medial:  15.7 LV IVS:        1.10 cm  LV e' lateral:   9.79 cm/s LVOT diam:     1.90 cm  LV E/e' lateral: 9.3 LV SV:         54 LV SV Index:   29 LVOT Area:     2.84 cm  RIGHT VENTRICLE RV Basal diam:  2.70 cm RV S prime:     13.20 cm/s LEFT ATRIUM           Index       RIGHT ATRIUM           Index LA diam:      3.30 cm 1.76 cm/m  RA Area:     13.20 cm LA Vol (A2C): 29.7 ml 15.85 ml/m RA Volume:   29.00 ml  15.47 ml/m LA Vol (A4C): 34.2 ml 18.25 ml/m  AORTIC VALVE LVOT Vmax:   106.00 cm/s LVOT Vmean:  69.300 cm/s LVOT VTI:    0.189 m  AORTA Ao Root diam: 2.40 cm MITRAL VALVE MV Area (PHT): 8.62 cm    SHUNTS MV Decel Time: 88 msec     Systemic VTI:  0.19 m MV E velocity: 90.70 cm/s  Systemic Diam: 1.90 cm MV A velocity: 99.40 cm/s MV E/A ratio:  0.91 Mihai Croitoru MD Electronically signed by Sanda Klein MD Signature Date/Time: 12/02/2020/3:52:17 PM    Final      Assessment/Plan: Staph bacteremia/MSSA unknown source  R thumb wound fever Encephalopathy  Total days of antibiotics: 3 ancef  Mental status better- she does not know place  but does know date: year, month.  Will continue ancef Fever better today. Will continue to watch.  TEE sched for AM.  Repeat BCx sent 12-02-20.          Bobby Rumpf MD, FACP Infectious Diseases (pager) [phone] [email or web address] 12/03/2020, 2:41 PM  LOS:  3 days

## 2020-12-03 NOTE — Progress Notes (Signed)
Arranged [facility] to transport patient to [facility] by 1000 for 1100 TEE.

## 2020-12-03 NOTE — Care Management (Signed)
Transition of Care (TOC) -30 day Note       Patient Details   Name:[name]   DJM:[id number]  Date of QIWLN:[id number]     Transition of Care (TOC) CM/SW Contact   Name:Teven Mittman RNCM  Phone Number:[phone]  Date:12/03/2020  Time:10:48a     MUST [id number]     To Whom it May Concern:     Please be advised that the above patient will require a short-term nursing home stay, anticipated 30 days or less rehabilitation and strengthening. The plan is for return home.

## 2020-12-03 NOTE — TOC Initial Note (Signed)
Transition of Care [facility]) - Initial/Assessment Note    Patient Details  Name: [name]: [id number] Date of Birth: Nov 12, 1945  Transition of Care [facility] CM/SW Contact:    Dessa Phi, RN Phone Number: 12/03/2020, 10:26 AM  Clinical Narrative: PT recc SNF-patient defers to dtr April-agrees to SNF-fax out await bed offers.                  Expected Discharge Plan: Skilled Nursing Facility Barriers to Discharge: Continued Medical Work up   Patient Goals and CMS Choice Patient states their goals for this hospitalization and ongoing recovery are:: go to rehab CMS Medicare.gov Compare Post Acute Care list provided to:: Patient Choice offered to / list presented to : Patient  Expected Discharge Plan and Services Expected Discharge Plan: [place]   Discharge Planning Services: CM Consult Post Acute Care Choice: [place] Living arrangements for the past 2 months: Single Family Home                                      Prior Living Arrangements/Services Living arrangements for the past 2 months: Single Family Home Lives with:: Self Patient language and need for interpreter reviewed:: Yes Do you feel safe going back to the place where you live?: Yes      Need for Family Participation in Patient Care: No (Comment) Care giver support system in place?: Yes (comment) Current home services: DME (rw) Criminal Activity/Legal Involvement Pertinent to Current Situation/Hospitalization: No - Comment as needed  Activities of Daily Living Home Assistive Devices/Equipment: Bedside commode/3-in-1,Eyeglasses,Dentures (specify type),Walker (specify type),Shower chair with back (upper/lower dentures, front wheeled walker) ADL Screening (condition at time of admission) Patient's cognitive ability adequate to safely complete daily activities?: Yes Is the patient deaf or have difficulty hearing?: No Does the patient have difficulty seeing, even when  wearing glasses/contacts?: No Does the patient have difficulty concentrating, remembering, or making decisions?: No Patient able to express need for assistance with ADLs?: Yes Does the patient have difficulty dressing or bathing?: No Independently performs ADLs?: No Communication: Independent Dressing (OT): Independent Grooming: Independent Feeding: Independent Bathing: Independent Toileting: Independent with device (comment) In/Out Bed: Independent Walks in Home: Independent with device (comment) Does the patient have difficulty walking or climbing stairs?: Yes (secondary to weakness) Weakness of Legs: Both Weakness of Arms/Hands: None  Permission Sought/Granted Permission sought to share information with : Case Manager Permission granted to share information with : Yes, Verbal Permission Granted  Share Information with NAME: Case Manager     Permission granted to share info w Relationship: April Forsbrey dtr [phone]     Emotional Assessment Appearance:: Appears stated age Attitude/Demeanor/Rapport: Gracious Affect (typically observed): Accepting Orientation: : Oriented to Self,Oriented to Place,Oriented to  Time,Oriented to Situation Alcohol / Substance Use: Not Applicable Psych Involvement: No (comment)  Admission diagnosis:  SIRS (systemic inflammatory response syndrome) (HCC) [R65.10] Fever, unspecified fever cause [R50.9] Patient Active Problem List   Diagnosis Date Noted  . SIRS (systemic inflammatory response syndrome) ([place]) 11/30/2020  . Lumbar radiculopathy 10/10/2019  . Bilateral stenosis of lateral recess of lumbar spine 10/10/2019  . Hemorrhoids, external without complications 45/80/9983  . Depression   . Anxiety   . Rectal bleeding   . History of radiation therapy   . Malignant neoplasm of anal canal ([place]) 11/15/2011  . Hemorrhoids, internal, with bleeding 09/07/2011  . Acquired anal stenosis  09/07/2011  . Hyperlipidemia 09/07/2011  . Family history  of breast cancer in sister 09/07/2011   PCP:  [name], MD Pharmacy:   [facility] [place] [address] Phone: [phone] Fax: [phone]     Social Determinants of Health (SDOH) Interventions    Readmission Risk Interventions No flowsheet data found.

## 2020-12-03 NOTE — Progress Notes (Addendum)
INFECTIOUS DISEASE PROGRESS NOTE  ID: [name] is a 75 y.o. female with  Principal Problem:   SIRS (systemic inflammatory response syndrome) (HCC) Active Problems:   Hyperlipidemia   Malignant neoplasm of anal canal (HCC)   Depression   History of radiation therapy   Lumbar radiculopathy  Subjective: Temp 102 twice yesterday She wants to go home and she her grandson.   Abtx:  Anti-infectives (From admission, onward)   Start     Dose/Rate Route Frequency Ordered Stop   12/01/20 2200  ceFAZolin (ANCEF) IVPB 2g/100 mL premix        2 g 200 mL/hr over 30 Minutes Intravenous Every 8 hours 12/01/20 1347     12/01/20 2000  vancomycin (VANCOREADY) IVPB 1250 mg/250 mL  Status:  Discontinued        1,250 mg 166.7 mL/hr over 90 Minutes Intravenous Every 24 hours 12/01/20 0005 12/01/20 1347   12/01/20 0400  ceFEPIme (MAXIPIME) 2 g in sodium chloride 0.9 % 100 mL IVPB  Status:  Discontinued        2 g 200 mL/hr over 30 Minutes Intravenous Every 8 hours 11/30/20 2230 12/01/20 1347   11/30/20 2245  metroNIDAZOLE (FLAGYL) IVPB 500 mg  Status:  Discontinued        500 mg 100 mL/hr over 60 Minutes Intravenous Every 8 hours 11/30/20 2230 12/01/20 1347   11/30/20 2245  vancomycin (VANCOCIN) IVPB 1000 mg/200 mL premix  Status:  Discontinued        1,000 mg 200 mL/hr over 60 Minutes Intravenous  Once 11/30/20 2230 11/30/20 2237   11/30/20 1900  vancomycin (VANCOREADY) IVPB 1750 mg/350 mL        1,750 mg 175 mL/hr over 120 Minutes Intravenous STAT 11/30/20 1850 11/30/20 2231   11/30/20 1900  ceFEPIme (MAXIPIME) 2 g in sodium chloride 0.9 % 100 mL IVPB        2 g 200 mL/hr over 30 Minutes Intravenous STAT 11/30/20 1850 11/30/20 2023      Medications:  Scheduled: . amLODipine  5 mg Oral Daily  . ARIPiprazole  10 mg Oral Daily  . DULoxetine  120 mg Oral Daily  . enoxaparin (LOVENOX) injection  40 mg Subcutaneous Q24H  . folic acid  [id number] mcg Oral Daily  . gabapentin  600 mg Oral BID   . levothyroxine  25 mcg Oral Q0600  . pravastatin  40 mg Oral Q breakfast  . traZODone  100 mg Oral QHS  . vitamin B-12  500 mcg Oral Daily    Objective: Vital signs in last 24 hours: Temp:  [98.7 F (37.1 C)-102.4 F (39.1 C)] 98.7 F (37.1 C) (02/02 0508) Pulse Rate:  [93-99] 94 (02/02 0508) Resp:  [17-20] 20 (02/02 0508) BP: (147-157)/(72-92) 149/92 (02/02 0508) SpO2:  [96 %-97 %] 97 % (02/02 0508)   General appearance: alert, cooperative and no distress Resp: clear to auscultation bilaterally Cardio: regular rate and rhythm GI: normal findings: bowel sounds normal and soft, non-tender  Lab Results Recent Labs    12/02/20 0540 12/03/20 0550  WBC 8.3 6.4  HGB 11.1* 11.1*  HCT 35.7* 33.9*  NA 141 138  K 4.6 3.9  CL 112* 103  CO2 18* 22  BUN 16 12  CREATININE 0.70 0.44   Liver Panel Recent Labs    12/01/20 0255  PROT 6.3*  ALBUMIN 3.3*  AST 31  ALT 25  ALKPHOS 55  BILITOT 0.7   Sedimentation Rate No results for input(s):  ESRSEDRATE in the last 72 hours. C-Reactive Protein No results for input(s): CRP in the last 72 hours.  Microbiology: Recent Results (from the past 240 hour(s))  Urine culture     Status: None   Collection Time: 11/30/20  1:44 PM   Specimen: Urine, Random  Result Value Ref Range Status   Specimen Description   Final    URINE, RANDOM Performed at [place] [address] [place], [place] [address]    Special Requests   Final    NONE Performed at [facility], [place] [address], [place], [place] [address]    Culture   Final    NO GROWTH Performed at [facility], [place] [address] [place], [place] [address]    Report Status 12/01/2020 FINAL  Final  Culture, blood (routine x 2)     Status: Abnormal   Collection Time: 11/30/20  7:25 PM   Specimen: BLOOD  Result Value Ref Range Status   Specimen Description   Final    BLOOD BLOOD LEFT FOREARM Performed at [place] [address] [place], [place] [address]    Special Requests   Final    BOTTLES DRAWN AEROBIC AND ANAEROBIC Blood Culture results may not be optimal due to an inadequate volume of blood received in culture bottles   Culture  Setup Time   Final    GRAM POSITIVE COCCI IN CLUSTERS IN BOTH AEROBIC AND ANAEROBIC BOTTLES CRITICAL RESULT CALLED TO, READ BACK BY AND VERIFIED WITH: Melodye Ped PharmD 13:20 12/01/20 ([email or web address]) Performed at [facility], [place] [address] [place], [place] [address]    Culture STAPHYLOCOCCUS AUREUS (A)  Final   Report Status 12/03/2020 FINAL  Final   Organism ID, Bacteria STAPHYLOCOCCUS AUREUS  Final      Susceptibility   Staphylococcus aureus - MIC*    CIPROFLOXACIN <=0.5 SENSITIVE Sensitive     ERYTHROMYCIN <=0.25 SENSITIVE Sensitive     GENTAMICIN <=0.5 SENSITIVE Sensitive     OXACILLIN <=0.25 SENSITIVE Sensitive     TETRACYCLINE <=1 SENSITIVE Sensitive     VANCOMYCIN 1 SENSITIVE Sensitive     TRIMETH/SULFA <=10 SENSITIVE Sensitive     CLINDAMYCIN <=0.25 SENSITIVE Sensitive     RIFAMPIN <=0.5 SENSITIVE Sensitive     Inducible Clindamycin NEGATIVE Sensitive     * STAPHYLOCOCCUS AUREUS  SARS Coronavirus 2 by RT PCR (hospital order, performed in [place] hospital lab) Nasopharyngeal Nasopharyngeal Swab     Status: None   Collection Time: 11/30/20  7:25 PM   Specimen: Nasopharyngeal Swab  Result Value Ref Range Status   SARS Coronavirus 2 NEGATIVE NEGATIVE Final    Comment: (NOTE) SARS-CoV-2 target nucleic acids are NOT DETECTED.  The SARS-CoV-2 RNA is generally detectable in upper and lower respiratory specimens during the acute phase of infection. The lowest concentration of SARS-CoV-2 viral copies this assay can detect is 250 copies / mL. A negative result does not preclude SARS-CoV-2 infection and should not be used as the sole basis for treatment or other patient management decisions.  A negative result may occur with improper specimen  collection / handling, submission of specimen other than nasopharyngeal swab, presence of viral mutation(s) within the areas targeted by this assay, and inadequate number of viral copies (<250 copies / mL). A negative result must be combined with clinical observations, patient history, and epidemiological information.  Fact Sheet for Patients:   StrictlyIdeas.no  Fact Sheet for Healthcare Providers: BankingDealers.co.za  This test is not yet approved or  cleared by the [place] and has been authorized for detection and/or diagnosis of SARS-CoV-2 by FDA under an Emergency Use Authorization (EUA).  This EUA will remain in effect (meaning this test can be used) for the duration of the COVID-19 declaration under Section 564(b)(1) of the Act, 21 U.S.C. section 360bbb-3(b)(1), unless the authorization is terminated or revoked sooner.  Performed at [facility], [place] [address], [place], [place] [address]   Blood Culture ID Panel (Reflexed)     Status: Abnormal   Collection Time: 11/30/20  7:25 PM  Result Value Ref Range Status   Enterococcus faecalis NOT DETECTED NOT DETECTED Final   Enterococcus Faecium NOT DETECTED NOT DETECTED Final   Listeria monocytogenes NOT DETECTED NOT DETECTED Final   Staphylococcus species DETECTED (A) NOT DETECTED Final    Comment: CRITICAL RESULT CALLED TO, READ BACK BY AND VERIFIED WITH: Melodye Ped PharmD 13:20 12/01/20 ([email or web address])    Staphylococcus aureus (BCID) DETECTED (A) NOT DETECTED Final    Comment: CRITICAL RESULT CALLED TO, READ BACK BY AND VERIFIED WITH: Melodye Ped PharmD 13:20 12/01/20 ([email or web address])    Staphylococcus epidermidis NOT DETECTED NOT DETECTED Final   Staphylococcus lugdunensis NOT DETECTED NOT DETECTED Final   Streptococcus species NOT DETECTED NOT DETECTED Final   Streptococcus agalactiae NOT DETECTED NOT DETECTED Final   Streptococcus pneumoniae NOT DETECTED NOT  DETECTED Final   Streptococcus pyogenes NOT DETECTED NOT DETECTED Final   A.calcoaceticus-baumannii NOT DETECTED NOT DETECTED Final   Bacteroides fragilis NOT DETECTED NOT DETECTED Final   Enterobacterales NOT DETECTED NOT DETECTED Final   Enterobacter cloacae complex NOT DETECTED NOT DETECTED Final   Escherichia coli NOT DETECTED NOT DETECTED Final   Klebsiella aerogenes NOT DETECTED NOT DETECTED Final   Klebsiella oxytoca NOT DETECTED NOT DETECTED Final   Klebsiella pneumoniae NOT DETECTED NOT DETECTED Final   Proteus species NOT DETECTED NOT DETECTED Final   Salmonella species NOT DETECTED NOT DETECTED Final   Serratia marcescens NOT DETECTED NOT DETECTED Final   Haemophilus influenzae NOT DETECTED NOT DETECTED Final   Neisseria meningitidis NOT DETECTED NOT DETECTED Final   Pseudomonas aeruginosa NOT DETECTED NOT DETECTED Final   Stenotrophomonas maltophilia NOT DETECTED NOT DETECTED Final   Candida albicans NOT DETECTED NOT DETECTED Final   Candida auris NOT DETECTED NOT DETECTED Final   Candida glabrata NOT DETECTED NOT DETECTED Final   Candida krusei NOT DETECTED NOT DETECTED Final   Candida parapsilosis NOT DETECTED NOT DETECTED Final   Candida tropicalis NOT DETECTED NOT DETECTED Final   Cryptococcus neoformans/gattii NOT DETECTED NOT DETECTED Final   Meth resistant mecA/C and MREJ NOT DETECTED NOT DETECTED Final    Comment: Performed at [facility], [address] [place], [place] [address]  Culture, blood (Routine X 2) w Reflex to ID Panel     Status: Abnormal   Collection Time: 11/30/20  8:06 PM   Specimen: BLOOD  Result Value Ref Range Status   Specimen Description   Final    BLOOD BLOOD LEFT HAND Performed at [place] [address] [place], [place] [address]    Special Requests   Final    BOTTLES DRAWN AEROBIC AND ANAEROBIC Blood Culture results may not be optimal due to an inadequate volume of blood received in culture  bottles Performed at [place] [address], [place], [place] [address]    Culture  Setup Time   Final  GRAM POSITIVE COCCI IN CLUSTERS IN BOTH AEROBIC AND ANAEROBIC BOTTLES CRITICAL VALUE NOTED.  VALUE IS CONSISTENT WITH PREVIOUSLY REPORTED AND CALLED VALUE.    Culture (A)  Final    STAPHYLOCOCCUS AUREUS SUSCEPTIBILITIES PERFORMED ON PREVIOUS CULTURE WITHIN THE LAST 5 DAYS. Performed at [facility], [place] [address], [place], [place] [address]    Report Status 12/03/2020 FINAL  Final  Culture, blood (Routine X 2) w Reflex to ID Panel     Status: None (Preliminary result)   Collection Time: 12/02/20  5:40 AM   Specimen: BLOOD RIGHT HAND  Result Value Ref Range Status   Specimen Description   Final    BLOOD RIGHT HAND Performed at [place] [address], [place], [place] [address]    Special Requests   Final    BOTTLES DRAWN AEROBIC ONLY Blood Culture results may not be optimal due to an inadequate volume of blood received in culture bottles Performed at [place] [address], [place], [place] [address]    Culture   Final    NO GROWTH 1 DAY Performed at [facility], [place] [address], [place], [place] [address]    Report Status PENDING  Incomplete    Studies/Results: ECHOCARDIOGRAM COMPLETE  Result Date: 12/02/2020    ECHOCARDIOGRAM REPORT   Patient Name:   [name] Date of Exam: 12/02/2020 Medical Rec #:  [id number]       Height:       65.0 in Accession #:    [id number]      Weight:       176.1 lb Date of Birth:  [birth year]       BSA:          1.874 m Patient Age:    65 years        BP:           146/79 mmHg Patient Gender: F               HR:           92 bpm. Exam Location:  Inpatient Procedure: 2D Echo, Cardiac Doppler and Color Doppler Indications:    Bacteremia  History:        Patient has no prior history of Echocardiogram examinations.                 Signs/Symptoms:Bacteremia and  Fever; Risk Factors:Hypertension.                 Sepsis.  Sonographer:    Dustin Flock Referring Phys: [phone] [place]  1. Left ventricular ejection fraction, by estimation, is 55 to 60%. The left ventricle has normal function. The left ventricle has no regional wall motion abnormalities. Left ventricular diastolic parameters are consistent with Grade I diastolic dysfunction (impaired relaxation).  2. Right ventricular systolic function is normal. The right ventricular size is normal.  3. The mitral valve is normal in structure. No evidence of mitral valve regurgitation. No evidence of mitral stenosis.  4. The aortic valve is normal in structure. Aortic valve regurgitation is not visualized. No aortic stenosis is present.  5. The inferior vena cava is normal in size with greater than 50% respiratory variability, suggesting right atrial pressure of 3 mmHg. Conclusion(s)/Recommendation(s): No evidence of valvular vegetations on this transthoracic echocardiogram. Would recommend a transesophageal echocardiogram to exclude infective endocarditis if clinically indicated. FINDINGS  Left Ventricle: Left ventricular ejection fraction, by estimation, is 55 to  60%. The left ventricle has normal function. The left ventricle has no regional wall motion abnormalities. The left ventricular internal cavity size was normal in size. There is  no left ventricular hypertrophy. Left ventricular diastolic parameters are consistent with Grade I diastolic dysfunction (impaired relaxation). Indeterminate filling pressures. Right Ventricle: The right ventricular size is normal. No increase in right ventricular wall thickness. Right ventricular systolic function is normal. Left Atrium: Left atrial size was normal in size. Right Atrium: Right atrial size was normal in size. Pericardium: There is no evidence of pericardial effusion. Mitral Valve: The mitral valve is normal in structure. No evidence of mitral valve  regurgitation. No evidence of mitral valve stenosis. Tricuspid Valve: The tricuspid valve is normal in structure. Tricuspid valve regurgitation is not demonstrated. No evidence of tricuspid stenosis. Aortic Valve: The aortic valve is normal in structure. Aortic valve regurgitation is not visualized. No aortic stenosis is present. Pulmonic Valve: The pulmonic valve was normal in structure. Pulmonic valve regurgitation is not visualized. No evidence of pulmonic stenosis. Aorta: The aortic root is normal in size and structure. Venous: The inferior vena cava is normal in size with greater than 50% respiratory variability, suggesting right atrial pressure of 3 mmHg. IAS/Shunts: No atrial level shunt detected by color flow Doppler.  LEFT VENTRICLE PLAX 2D LVIDd:         4.50 cm  Diastology LVIDs:         2.80 cm  LV e' medial:    5.77 cm/s LV PW:         1.10 cm  LV E/e' medial:  15.7 LV IVS:        1.10 cm  LV e' lateral:   9.79 cm/s LVOT diam:     1.90 cm  LV E/e' lateral: 9.3 LV SV:         54 LV SV Index:   29 LVOT Area:     2.84 cm  RIGHT VENTRICLE RV Basal diam:  2.70 cm RV S prime:     13.20 cm/s LEFT ATRIUM           Index       RIGHT ATRIUM           Index LA diam:      3.30 cm 1.76 cm/m  RA Area:     13.20 cm LA Vol (A2C): 29.7 ml 15.85 ml/m RA Volume:   29.00 ml  15.47 ml/m LA Vol (A4C): 34.2 ml 18.25 ml/m  AORTIC VALVE LVOT Vmax:   106.00 cm/s LVOT Vmean:  69.300 cm/s LVOT VTI:    0.189 m  AORTA Ao Root diam: 2.40 cm MITRAL VALVE MV Area (PHT): 8.62 cm    SHUNTS MV Decel Time: 88 msec     Systemic VTI:  0.19 m MV E velocity: 90.70 cm/s  Systemic Diam: 1.90 cm MV A velocity: 99.40 cm/s MV E/A ratio:  0.91 Mihai Croitoru MD Electronically signed by Sanda Klein MD Signature Date/Time: 12/02/2020/3:52:17 PM    Final      Assessment/Plan: Staph bacteremia/MSSA unknown source  R thumb wound fever Encephalopathy  Total days of antibiotics: 3 ancef  Mental status better- she does not know place  but does know date: year, month.  Will continue ancef Fever better today. Will continue to watch.  TEE sched for AM.  Repeat BCx sent 12-02-20.          Bobby Rumpf MD, FACP Infectious Diseases (pager) [phone] [email or web address] 12/03/2020, 2:41 PM  LOS:  3 days

## 2020-12-03 NOTE — TOC Progression Note (Signed)
Transition of Care [facility]) - Progression Note    Patient Details  Name: [name]: [id number] Date of Birth: 1946-02-10  Transition of Care [facility]) CM/SW Contact  Mahabir, Juliann Pulse, RN Phone Number: 12/03/2020, 3:13 PM  Clinical Narrative:  Bed offers given-await choice.   1. 0.9 mi [place] [facility] [place], [place] [address] [phone] Overall rating Much above average 2. 1.3 mi [place] at [place], [place] [address] [phone] Overall rating Much below average 3. 2.3 mi [place], [place] [address] [phone] Overall rating Much below average 4. 2.7 mi [place] [address] [place], [place] [address] [phone] Overall rating Below average 5. 3 mi [facility] [place], [place] [address] [phone] Overall rating Below average 6. 3.1 [place], [place] [address] [phone] Overall rating Below average 7. 3.4 mi [facility] at the [place], [place] [address] [phone] Overall rating Below average 8. 3.4 [place], [place] [address] [phone] Overall rating Above average 9. [place] [address], [place] [address] [phone] Overall rating Much below average 10. 4.1 mi [facility] at [place], [place] [address] [phone] Overall rating Much above average 11. 4.2 [place], [place] [address] [phone] Overall rating Much above average 12. 4.2 mi [facility] [place],  [place] [address] [phone] Overall rating Below average 13. [place], [place] [address] [phone] Overall rating Above average 14. 8.2 [place], [place] [address] [phone] Overall rating Above average 15. 8.4 mi [facility] [address], [place] [address] [phone] Overall rating Average 16. 8.7 mi [facility] [place], [place] [address] [phone] Overall rating Much above average 17. 9.6 mi [facility] [place], [place] [address] [phone] Overall rating Average 18. 10.3 mi [place] at [facility] [address] [place], [place] [address] [phone] Overall rating Much above average 19. 12.1 mi [facility] [address] [place], [place] [address] [phone] Overall rating Much below average 20. 12.2 [facility] [address] [place], [place] [address] [phone] Overall rating Much below average 21. 13.7 mi [facility] [place] [facility] [address] [place], [place] [address] [phone] Overall rating Much below average 22. 13.8 mi [facility] at [place], [place] [address] [phone] Overall rating Below average 23. 14.4 mi [place] [facility] [place], [place] [address] [phone] Overall rating Below average 24. 14.5 [place] [address] [place], [place] [address] [phone] Overall rating Much below average 25. 16.5 mi [place] [address] [place],  [address] [phone] Overall rating Above average 26. 17.2 mi [facility] [place],  [address] [phone] Overall rating Much above average 27. 54.5 mi [facility] [address]  [place], [place] [address] [phone] Overall rating Much below average 28. 18.4 mi [place], [place] [address] [phone] Overall rating Average 29. 19.2 mi [facility] [address] [place], [place] [address] [phone] Overall rating Much below average 30. 20.5 mi [address] at the [facility], [place] [address] [phone] Overall rating Much above average 31. 21.2 mi [address] [place], [place] [address] [phone] Overall rating Below average 32. 21.2 [place], [place] [address] [phone] Overall rating Average 33. 21.4 [address] [place] [address] [place], [place] [address] [phone] Overall rating Much above average 34. [place] [address] [place], [place] [address] [phone] Overall rating Below average 35. 22.2 [place], [place] [address] [phone] Overall rating Much above average 36. 22.4 mi [facility] [place], [place] [address] [phone] Overall rating Much below average 37. 23.2 mi [facility] [address] [place], [place] [address] [phone] Overall rating Below average 38. 23.8 mi [facility] [address] [place], [place] [address] [phone] Overall rating Above average 39. 23.9 [facility]  [place], [place] [address] [phone] Overall rating Much below average 40. [place], [place] [address] [phone] Overall rating Much  below average 41. [place] [address] [place], [place] [address] [phone] Overall rating Average 42. 24.3 [facility] [address] [place], [place] [address] [phone] Overall rating Much below average 43. 24.3 mi [facility] [place], [place] [address] [phone] Overall rating Below average 44. 24.8 mi [place] [address] [place], [place] [address] [phone] Overall rating Above average 45. 24.9 mi The [place] at [place], [place] [address] [phone] Overall rating Much below average To explore and download  Expected Discharge Plan: [place] Barriers to Discharge: Continued Medical Work up  Expected Discharge Plan and Services Expected Discharge Plan: [place]   Discharge Planning Services: CM Consult Post Acute Care Choice: [place] arrangements for the past 2 months: Single Family Home                                       Social Determinants of Health (SDOH) Interventions    Readmission Risk Interventions No flowsheet data found.

## 2020-12-03 NOTE — Evaluation (Signed)
Occupational Therapy Evaluation Patient Details Name: [name]: [id number] DOB: Dec 19, 1945 Today's Date: 12/03/2020    History of Present Illness 75 y.o. female with past medical history of hypothyroidism and depression presented to the hospital with cough and not feeling well for the last 2 weeks.  Patient had presented to the ED a day before this admission for dysuria and a CT scan of the abdomen was done which showed nothing acute.  He was then discharged home but during the evening patient was noted to be more febrile and confused and was brought into the back to the hospital.   In the ED, patient was noted to have temperature 103 F, tachycardic with elevated lactic acid levels features concerning for sepsis.  Pt admitted with MSSA bacteremia/sepsis   Clinical Impression   [name] is a 75 year old woman admitted to hospital with above medical history who presents with mild confusion. She is alert and oriented to month, year and person but had difficulty stating place and required verbal cue. When she reports her history her conversation is somewhat tangential and hard to follow. Patient is typically independent, lives alone and has been working at [employer]. She has been dealing with chronic back pain and reports increasing memory deficits. On evaluation patient needing increased assistance with bed mobility and transfers - mobility limited to just transfer to [facility] and then to recliner due to pain in left lower back as well as with LB ADLs. Patient will benefit from skilled OT services while in hospital to improve deficits and learn compensatory strategies as needed in order to return to PLOF. Recommend short term rehab at discharge in order to return patient to prior independence.      Follow Up Recommendations  SNF    Equipment Recommendations  Tub/shower seat    Recommendations for Other Services       Precautions / Restrictions Precautions Precautions:  Fall Precaution Comments: urinary incontinence Restrictions Weight Bearing Restrictions: No      Mobility Bed Mobility Overal bed mobility: Needs Assistance Bed Mobility: Supine to Sit     Supine to sit: Min assist     General bed mobility comments: assist for LEs over EOB and trunk upright, pt attempted to self assist as able    Transfers Overall transfer level: Needs assistance Equipment used: Rolling walker (2 wheeled) Transfers: Sit to/from [facility] Sit to Stand: Min assist         General transfer comment: assist to rise and steady, cues for use of RW. Patient transferred to BSc and then to close recliner.    Balance Overall balance assessment: History of Falls                                         ADL either performed or assessed with clinical judgement   ADL Overall ADL's : Needs assistance/impaired Eating/Feeding: Independent   Grooming: Set up;Sitting   Upper Body Bathing: Set up;Sitting   Lower Body Bathing: Minimal assistance;Set up;Sit to/from stand   Upper Body Dressing : Set up;Sitting   Lower Body Dressing: Sit to/from stand;Moderate assistance Lower Body Dressing Details (indicate cue type and reason): min assis to thread feet through underwear. patient able to pull underwear up but needing asstance to get over hips as patient distracted by pain Toilet Transfer: BSC;Stand-pivot;RW;Minimal assistance   Toileting- Clothing Manipulation and Hygiene: Minimal assistance;Sit  to/from stand;Sitting/lateral lean Toileting - Clothing Manipulation Details (indicate cue type and reason): assistance for clothing management. patinet able to wipe in seated position.             Vision Patient Visual Report: No change from baseline Vision Assessment?: No apparent visual deficits     Perception     Praxis      Pertinent Vitals/Pain Pain Assessment: Faces Faces Pain Scale: Hurts whole lot Pain Location:  back Pain Descriptors / Indicators: Sore Pain Intervention(s): Limited activity within patient's tolerance;Monitored during session;Repositioned     Hand Dominance     Extremity/Trunk Assessment Upper Extremity Assessment Upper Extremity Assessment: Overall WFL for tasks assessed   Lower Extremity Assessment Lower Extremity Assessment: Defer to PT evaluation   Cervical / Trunk Assessment Cervical / Trunk Assessment: Normal   Communication Communication Communication: No difficulties   Cognition Arousal/Alertness: Awake/alert Behavior During Therapy: WFL for tasks assessed/performed Overall Cognitive Status: Impaired/Different from baseline                                 General Comments: pt reports poor memory lately, follows commands, appropriate conversations. Daughter in room after evaluation and reports some increasing memory deficits.   General Comments       Exercises     Shoulder Instructions      Home Living Family/patient expects to be discharged to:: Skilled nursing facility Living Arrangements: Alone   Type of Home: House Home Access: Stairs to enter Entrance Stairs-Number of Steps: 4 Entrance Stairs-Rails: Right Home Layout: One level               Home Equipment: Walker - 2 wheels          Prior Functioning/Environment Level of Independence: Independent        Comments: Patient independent and working at [employer] last week. Has been having chronic back pain for last 6 months that has been limiting her. Recently started pain medicine and the hope was to be assessed for potential surgery.        OT Problem List: Decreased activity tolerance;Impaired balance (sitting and/or standing);Pain;Decreased cognition;Decreased safety awareness;Decreased knowledge of use of DME or AE;Decreased knowledge of precautions      OT Treatment/Interventions: Self-care/ADL training;Therapeutic exercise;DME and/or AE instruction;Therapeutic  activities;Balance training;Patient/family education    OT Goals(Current goals can be found in the care plan section) Acute Rehab OT Goals Patient Stated Goal: to go home OT Goal Formulation: With patient Time For Goal Achievement: 12/17/20 Potential to Achieve Goals: Good  OT Frequency: Min 2X/week   Barriers to D/C:            Co-evaluation PT/OT/SLP Co-Evaluation/Treatment: Yes     OT goals addressed during session: ADL's and self-care (co-eval)      AM-PAC OT "6 Clicks" Daily Activity     Outcome Measure Help from another person eating meals?: None Help from another person taking care of personal grooming?: A Little Help from another person toileting, which includes using toliet, bedpan, or urinal?: A Little Help from another person bathing (including washing, rinsing, drying)?: A Little Help from another person to put on and taking off regular upper body clothing?: A Little Help from another person to put on and taking off regular lower body clothing?: A Lot 6 Click Score: 18   End of Session Equipment Utilized During Treatment: Rolling walker Nurse Communication: Mobility status;Patient requests pain meds  Activity Tolerance: Patient  tolerated treatment well Patient left: with call bell/phone within reach;with chair alarm set  OT Visit Diagnosis: Unsteadiness on feet (R26.81);Pain;History of falling (Z91.81)                Time: 4174-0814 OT Time Calculation (min): 19 min Charges:  OT General Charges $OT Visit: 1 Visit OT Evaluation $OT Eval Low Complexity: 1 Low  Broedy Osbourne, OTR/L [place]  Office [phone] Pager: [place] 12/03/2020, 11:25 AM

## 2020-12-03 NOTE — NC FL2 (Signed)
[place] LEVEL OF CARE SCREENING TOOL     IDENTIFICATION  Patient Name: [name] Birthdate: 04/13/1946 Sex: female Admission Date (Current Location): 11/30/2020  [facility] and [place] Number:  Herbalist and Address:  [facility],  [place] [address] [place]      Provider Number: [id number]  Attending Physician Name and Address:  Flora Lipps, MD  Relative Name and Phone Number:  April Forsbrey dtr [phone]    Current Level of Care: Hospital Recommended Level of Care: [place] Prior Approval Number:    Date Approved/Denied:   PASRR Number: [id number]  Discharge Plan: SNF    Current Diagnoses: Patient Active Problem List   Diagnosis Date Noted  . SIRS (systemic inflammatory response syndrome) ([place]) 11/30/2020  . Lumbar radiculopathy 10/10/2019  . Bilateral stenosis of lateral recess of lumbar spine 10/10/2019  . Hemorrhoids, external without complications 65/99/3570  . Depression   . Anxiety   . Rectal bleeding   . History of radiation therapy   . Malignant neoplasm of anal canal ([place]) 11/15/2011  . Hemorrhoids, internal, with bleeding 09/07/2011  . Acquired anal stenosis 09/07/2011  . Hyperlipidemia 09/07/2011  . Family history of breast cancer in sister 09/07/2011    Orientation RESPIRATION BLADDER Height & Weight     Self,Time,Situation,Place  Normal Continent Weight: 79.9 kg Height:  5\' 5"  (165.1 cm)  BEHAVIORAL SYMPTOMS/MOOD NEUROLOGICAL BOWEL NUTRITION STATUS      Continent Diet (Regular)  AMBULATORY STATUS COMMUNICATION OF NEEDS Skin   Limited Assist Verbally Normal                       Personal Care Assistance Level of Assistance  Bathing,Feeding,Dressing Bathing Assistance: Limited assistance Feeding assistance: Limited assistance Dressing Assistance: Limited assistance     Functional Limitations Info  Sight,Hearing,Speech Sight Info: Impaired  (eyeglasses) Hearing Info: Adequate Speech Info: Impaired (Dentures-full)    SPECIAL CARE FACTORS FREQUENCY  PT (By licensed PT),OT (By licensed OT)     PT Frequency: 5x week OT Frequency: 5x week            Contractures Contractures Info: Not present    Additional Factors Info  Code Status,Allergies,Psychotropic Code Status Info:  (Full) Allergies Info:  (NKA) Psychotropic Info:  (Abilify;cymbalta;see MAR)         Current Medications (12/03/2020):  This is the current hospital active medication list Current Facility-Administered Medications  Medication Dose Route Frequency Provider Last Rate Last Admin  . acetaminophen (TYLENOL) tablet 650 mg  650 mg Oral Q6H PRN Rise Patience, MD   650 mg at 12/03/20 1000   Or  . acetaminophen (TYLENOL) suppository 650 mg  650 mg Rectal Q6H PRN Rise Patience, MD      . amLODipine (NORVASC) tablet 5 mg  5 mg Oral Daily Pokhrel, Laxman, MD   5 mg at 12/03/20 1001  . ARIPiprazole (ABILIFY) tablet 10 mg  10 mg Oral Daily Rise Patience, MD   10 mg at 12/03/20 1001  . ceFAZolin (ANCEF) IVPB 2g/100 mL premix  2 g Intravenous Q8H Pokhrel, Laxman, MD 200 mL/hr at 12/03/20 1340 2 g at 12/03/20 1340  . DULoxetine (CYMBALTA) DR capsule 120 mg  120 mg Oral Daily Rise Patience, MD   120 mg at 12/03/20 1001  . enoxaparin (LOVENOX) injection 40 mg  40 mg Subcutaneous Q24H Rise Patience, MD   40 mg at 12/03/20 1004  .  folic acid (FOLVITE) tablet 0.5 mg  500 mcg Oral Daily Rise Patience, MD   0.5 mg at 12/03/20 1002  . gabapentin (NEURONTIN) capsule 600 mg  600 mg Oral BID Rise Patience, MD   600 mg at 12/03/20 1002  . levothyroxine (SYNTHROID) tablet 25 mcg  25 mcg Oral Q0600 Rise Patience, MD   25 mcg at 12/03/20 0545  . pravastatin (PRAVACHOL) tablet 40 mg  40 mg Oral Q breakfast Rise Patience, MD   40 mg at 12/03/20 0818  . traZODone (DESYREL) tablet 100 mg  100 mg Oral QHS Rise Patience, MD   100 mg at 12/02/20 2243  . vitamin B-12 (CYANOCOBALAMIN) tablet 500 mcg  500 mcg Oral Daily Rise Patience, MD   500 mcg at 12/03/20 1001     Discharge Medications: Please see discharge summary for a list of discharge medications.  Relevant Imaging Results:  Relevant Lab Results:   Additional Information SS#[id number] [place], [place]

## 2020-12-03 NOTE — Care Management Important Message (Signed)
Important Message  Patient Details IM Letter given to the Patient. Name: OAKLEIGH HESKETH MRN: [id number] Date of Birth: [birth year]   Medicare Important Message Given:  Yes     Kerin Salen 12/03/2020, 9:21 AM

## 2020-12-03 NOTE — NC FL2 (Signed)
[place] LEVEL OF CARE SCREENING TOOL     IDENTIFICATION  Patient Name: [name] Birthdate: [birth year] Sex: female Admission Date (Current Location): 11/30/2020  [facility] and [place] Number:  Herbalist and Address:  [facility],  [place] [address] [place]      Provider Number: [id number]  Attending Physician Name and Address:  Flora Lipps, MD  Relative Name and Phone Number:  April Forsbrey dtr [phone]    Current Level of Care: Hospital Recommended Level of Care: [place] Prior Approval Number:    Date Approved/Denied:   PASRR Number:    Discharge Plan: SNF    Current Diagnoses: Patient Active Problem List   Diagnosis Date Noted  . SIRS (systemic inflammatory response syndrome) ([place]) 11/30/2020  . Lumbar radiculopathy 10/10/2019  . Bilateral stenosis of lateral recess of lumbar spine 10/10/2019  . Hemorrhoids, external without complications 78/93/8101  . Depression   . Anxiety   . Rectal bleeding   . History of radiation therapy   . Malignant neoplasm of anal canal ([place]) 11/15/2011  . Hemorrhoids, internal, with bleeding 09/07/2011  . Acquired anal stenosis 09/07/2011  . Hyperlipidemia 09/07/2011  . Family history of breast cancer in sister 09/07/2011    Orientation RESPIRATION BLADDER Height & Weight     Self,Time,Situation,Place  Normal Continent Weight: 79.9 kg Height:  5\' 5"  (165.1 cm)  BEHAVIORAL SYMPTOMS/MOOD NEUROLOGICAL BOWEL NUTRITION STATUS      Continent Diet (Regular)  AMBULATORY STATUS COMMUNICATION OF NEEDS Skin   Limited Assist Verbally Normal                       Personal Care Assistance Level of Assistance  Bathing,Feeding,Dressing Bathing Assistance: Limited assistance Feeding assistance: Limited assistance Dressing Assistance: Limited assistance     Functional Limitations Info  Sight,Hearing,Speech Sight Info: Impaired (eyeglasses) Hearing Info:  Adequate Speech Info: Impaired (Dentures-full)    SPECIAL CARE FACTORS FREQUENCY  PT (By licensed PT),OT (By licensed OT)     PT Frequency: 5x week OT Frequency: 5x week            Contractures Contractures Info: Not present    Additional Factors Info  Code Status,Allergies,Psychotropic Code Status Info:  (Full) Allergies Info:  (NKA) Psychotropic Info:  (Abilify;cymbalta;see MAR)         Current Medications (12/03/2020):  This is the current hospital active medication list Current Facility-Administered Medications  Medication Dose Route Frequency Provider Last Rate Last Admin  . acetaminophen (TYLENOL) tablet 650 mg  650 mg Oral Q6H PRN Rise Patience, MD   650 mg at 12/03/20 1000   Or  . acetaminophen (TYLENOL) suppository 650 mg  650 mg Rectal Q6H PRN Rise Patience, MD      . amLODipine (NORVASC) tablet 5 mg  5 mg Oral Daily Pokhrel, Laxman, MD   5 mg at 12/03/20 1001  . ARIPiprazole (ABILIFY) tablet 10 mg  10 mg Oral Daily Rise Patience, MD   10 mg at 12/03/20 1001  . ceFAZolin (ANCEF) IVPB 2g/100 mL premix  2 g Intravenous Q8H Pokhrel, Laxman, MD 200 mL/hr at 12/03/20 0544 2 g at 12/03/20 0544  . DULoxetine (CYMBALTA) DR capsule 120 mg  120 mg Oral Daily Rise Patience, MD   120 mg at 12/03/20 1001  . enoxaparin (LOVENOX) injection 40 mg  40 mg Subcutaneous Q24H Rise Patience, MD   40 mg at 12/03/20 1004  .  folic acid (FOLVITE) tablet 0.5 mg  500 mcg Oral Daily Rise Patience, MD   0.5 mg at 12/03/20 1002  . gabapentin (NEURONTIN) capsule 600 mg  600 mg Oral BID Rise Patience, MD   600 mg at 12/03/20 1002  . levothyroxine (SYNTHROID) tablet 25 mcg  25 mcg Oral Q0600 Rise Patience, MD   25 mcg at 12/03/20 0545  . pravastatin (PRAVACHOL) tablet 40 mg  40 mg Oral Q breakfast Rise Patience, MD   40 mg at 12/03/20 0818  . traZODone (DESYREL) tablet 100 mg  100 mg Oral QHS Rise Patience, MD   100 mg at 12/02/20  2243  . vitamin B-12 (CYANOCOBALAMIN) tablet 500 mcg  500 mcg Oral Daily Rise Patience, MD   500 mcg at 12/03/20 1001     Discharge Medications: Please see discharge summary for a list of discharge medications.  Relevant Imaging Results:  Relevant Lab Results:   Additional Information SS#[id number], [place], [place]

## 2020-12-03 NOTE — Progress Notes (Signed)
PROGRESS NOTE  MACKENA RIEB [id number] DOB: December 25, 1945 DOA: 11/30/2020 PCP: [name], MD   LOS: 3 days   Brief narrative:  [name] is a 75 y.o. female with past medical history of hypothyroidism and depression presented to the hospital with cough and not feeling well for 2 weeks.  Patient had presented to the ED a day before this admission for dysuria and a CT scan of the abdomen was done which showed nothing acute.  She was then discharged home but during the evening, patient was noted to be more febrile and confused and was brought into the back to the hospital.   In the ED, patient was noted to have temperature 103 F, tachycardic with elevated lactic acid levels features concerning for sepsis.  CT angiogram of the chest, abdomen pelvis did not show anything acute.  CT renal study done during earlier visit showed nonobstructing stones and did show chronic compression fractures of the lumbar spine with no change from previous.  CT head was unremarkable.  Covid test was negative.  Blood cultures were drawn and started on sepsis protocol antibiotics were started. Procalcitonin was 1.15.  She was then admitted to hospital for sepsis.  Blood cultures showed MSSA sepsis.  There was no obvious source of infection.  2D echocardiogram showed preserved LV function without vegetation.  Currently awaiting for TEE.  ID has been on board.  Repeat cultures have been sent 12/02/2020  Assessment/Plan:  Principal Problem:   SIRS (systemic inflammatory response syndrome) (HCC) Active Problems:   Hyperlipidemia   Malignant neoplasm of anal canal (HCC)   Depression   History of radiation therapy   Lumbar radiculopathy  MSSA bacteremia/sepsis.  Unclear about obvious source of infection. Currently on IV cefazolin.  ID on board.  Transthoracic echocardiogram out vegetation.  Await TEE.    Acute metabolic encephalopathy secondary to MSSA infection..  Improved.  Alert awake and oriented but with  mildly impaired memory.  History of depression on Abilify and Cymbalta.    Continue.  Anemia of chronic disease.. Elevated MCV.  Monitor CBC.  Pending vitamin [id number], folic acid levels.  Hypothyroidism continue Synthroid.  History of anal cancer stage IIIa.  Followed by oncology as outpatient.  History of radiation treatment.  Elevated blood pressure.  Not on antihypertensive medications at home.  Likely new hypertension.  Has been started on low-dose amlodipine 5 mg daily.    Hypokalemia,Hypophosphatemia.  Improved with replacement  Referral neuropathy.   Continue gabapentin   Debility, deconditioning, generalized weakness, recent fall. Lives alone, family concerned about back pain and issues and mobility.  PT and OT has seen the patient and recommended skilled nursing facility placement.  Vitamin B12 folate within normal range  DVT prophylaxis: enoxaparin (LOVENOX) injection 40 mg Start: 12/01/20 1000  Code Status: Full code  Family Communication:  I spoke with the patient's daughter Ms April at bedside today.  Answered all the questions   Status is: Inpatient  Remains inpatient appropriate because:IV treatments appropriate due to intensity of illness or inability to take PO and Inpatient level of care appropriate due to severity of illness, MSSA sepsis, IV antibiotic, possible need for placement   Dispo: The patient is from: Home              Anticipated d/c is to: Skilled nursing facility placement as per PT             Anticipated d/c date is: 2 to 3 days  Patient currently is not medically stable to d/c.   Barriers to discharge.  Bacteremia requiring IV antibiotic, pending TEE, pending repeat bowel blood cultures, possible need for rehabilitation   Difficult to place patient No  Consultants:  ID   Cardiology notified for TEE  Procedures:  Transthoracic echo  Anti-infectives:  . Cefazolin 12/01/2020>  Subjective:  Today, patient was seen and examined  at bedside.  Denies any fever, chills, nausea, vomiting, shortness of breath.  Patient's daughter at bedside  Objective: Vitals:   12/02/20 2123 12/03/20 0508  BP: (!) 147/72 (!) 149/92  Pulse: 93 94  Resp: 17 20  Temp: 99.3 F (37.4 C) 98.7 F (37.1 C)  SpO2: 96% 97%    Intake/Output Summary (Last 24 hours) at 12/03/2020 0814 Last data filed at 12/03/2020 0715 Gross per 24 hour  Intake 240 ml  Output 700 ml  Net -460 ml   Filed Weights   12/01/20 2035  Weight: 79.9 kg   Body mass index is 29.31 kg/m.   Physical Exam:  General:  Average built, not in obvious distress HENT:   No scleral pallor or icterus noted. Oral mucosa is moist.  Chest:  Clear breath sounds.  Diminished breath sounds bilaterally. No crackles or wheezes.  CVS: S1 &S2 heard. No murmur.  Regular rate and rhythm. Abdomen: Soft, nontender, nondistended.  Bowel sounds are heard.   Extremities: No cyanosis, clubbing or edema.  Peripheral pulses are palpable. Psych: Alert, awake and oriented, impaired memory CNS:  No cranial nerve deficits.  Power equal in all extremities.  Generalized weakness noted Skin: Warm and dry.     Data Review: I have personally reviewed the following laboratory data and studies,  CBC: Recent Labs  Lab 11/30/20 0951 11/30/20 1925 11/30/20 2225 12/01/20 0255 12/02/20 0540 12/03/20 0550  WBC 12.3* 10.6* 8.5 9.1 8.3 6.4  NEUTROABS 10.9* 9.8*  --  8.2*  --   --   HGB 10.5* 12.6 9.9* 10.6* 11.1* 11.1*  HCT 31.3* 38.2 30.8* 32.7* 35.7* 33.9*  MCV 97.8 99.7 100.7* 100.9* 104.1* 98.0  PLT 171 188 146* 162 145* [id number]   Basic Metabolic Panel: Recent Labs  Lab 11/30/20 0951 11/30/20 1925 11/30/20 2225 12/01/20 0255 12/02/20 0540 12/03/20 0550  NA 134* 139  --  137 141 138  K 2.8* 3.3*  --  3.4* 4.6 3.9  CL 100 102  --  107 112* 103  CO2 21* 22  --  21* 18* 22  GLUCOSE 149* 107*  --  108* 120* 119*  BUN 14 14  --  13 16 12   CREATININE 0.73 0.71 0.62 0.74 0.70 0.44  CALCIUM  8.7* 9.5  --  8.3* 8.9 8.9  MG 1.7  --   --   --  2.3 2.0  PHOS  --   --   --   --  2.3* 2.9   Liver Function Tests: Recent Labs  Lab 11/30/20 0951 12/01/20 0255  AST 35 31  ALT 22 25  ALKPHOS 55 55  BILITOT 0.9 0.7  PROT 6.5 6.3*  ALBUMIN 3.7 3.3*   No results for input(s): LIPASE, AMYLASE in the last 168 hours. No results for input(s): AMMONIA in the last 168 hours. Cardiac Enzymes: No results for input(s): CKTOTAL, CKMB, CKMBINDEX, TROPONINI in the last 168 hours. BNP (last 3 results) No results for input(s): BNP in the last 8760 hours.  ProBNP (last 3 results) No results for input(s): PROBNP in the last 8760 hours.  CBG:  Recent Labs  Lab 12/01/20 1800 12/01/20 1855  GLUCAP 60* 90   Recent Results (from the past 240 hour(s))  Urine culture     Status: None   Collection Time: 11/30/20  1:44 PM   Specimen: Urine, Random  Result Value Ref Range Status   Specimen Description   Final    URINE, RANDOM Performed at [facility] [address], [place], [place] [address]    Special Requests   Final    NONE Performed at [facility], [place] [address] [place], [place] [address]    Culture   Final    NO GROWTH Performed at [facility], [place] [address] [place], [place] [address]    Report Status 12/01/2020 FINAL  Final  Culture, blood (routine x 2)     Status: Abnormal (Preliminary result)   Collection Time: 11/30/20  7:25 PM   Specimen: BLOOD  Result Value Ref Range Status   Specimen Description   Final    BLOOD BLOOD LEFT FOREARM Performed at [facility] [address], [place], [place] [address]    Special Requests   Final    BOTTLES DRAWN AEROBIC AND ANAEROBIC Blood Culture results may not be optimal due to an inadequate volume of blood received in culture bottles   Culture  Setup Time   Final    GRAM POSITIVE COCCI IN CLUSTERS IN BOTH AEROBIC AND ANAEROBIC BOTTLES CRITICAL RESULT CALLED TO,  READ BACK BY AND VERIFIED WITH: Melodye Ped PharmD 13:20 12/01/20 ([email or web address])    Culture (A)  Final    STAPHYLOCOCCUS AUREUS SUSCEPTIBILITIES TO FOLLOW Performed at [facility], [place] [address] [place], [place] [address]    Report Status PENDING  Incomplete  SARS Coronavirus 2 by RT PCR (hospital order, performed in [facility] hospital lab) Nasopharyngeal Nasopharyngeal Swab     Status: None   Collection Time: 11/30/20  7:25 PM   Specimen: Nasopharyngeal Swab  Result Value Ref Range Status   SARS Coronavirus 2 NEGATIVE NEGATIVE Final    Comment: (NOTE) SARS-CoV-2 target nucleic acids are NOT DETECTED.  The SARS-CoV-2 RNA is generally detectable in upper and lower respiratory specimens during the acute phase of infection. The lowest concentration of SARS-CoV-2 viral copies this assay can detect is 250 copies / mL. A negative result does not preclude SARS-CoV-2 infection and should not be used as the sole basis for treatment or other patient management decisions.  A negative result may occur with improper specimen collection / handling, submission of specimen other than nasopharyngeal swab, presence of viral mutation(s) within the areas targeted by this assay, and inadequate number of viral copies (<250 copies / mL). A negative result must be combined with clinical observations, patient history, and epidemiological information.  Fact Sheet for Patients:   StrictlyIdeas.no  Fact Sheet for Healthcare Providers: BankingDealers.co.za  This test is not yet approved or  cleared by the [place] FDA and has been authorized for detection and/or diagnosis of SARS-CoV-2 by FDA under an Emergency Use Authorization (EUA).  This EUA will remain in effect (meaning this test can be used) for the duration of the COVID-19 declaration under Section 564(b)(1) of the Act, 21 U.S.C. section 360bbb-3(b)(1), unless the authorization is terminated  or revoked sooner.  Performed at [facility], [place] [address] [place], [place] [address]   Blood Culture ID Panel (Reflexed)     Status: Abnormal   Collection Time: 11/30/20  7:25 PM  Result Value Ref Range Status   Enterococcus faecalis NOT DETECTED NOT DETECTED Final   Enterococcus Faecium NOT DETECTED NOT DETECTED Final   Listeria monocytogenes NOT DETECTED NOT DETECTED Final   Staphylococcus species DETECTED (A) NOT DETECTED Final    Comment: CRITICAL RESULT CALLED TO, READ BACK BY AND VERIFIED WITH: Melodye Ped PharmD 13:20 12/01/20 ([email or web address])    Staphylococcus aureus (BCID) DETECTED (A) NOT DETECTED Final    Comment: CRITICAL RESULT CALLED TO, READ BACK BY AND VERIFIED WITH: Melodye Ped PharmD 13:20 12/01/20 ([email or web address])    Staphylococcus epidermidis NOT DETECTED NOT DETECTED Final   Staphylococcus lugdunensis NOT DETECTED NOT DETECTED Final   Streptococcus species NOT DETECTED NOT DETECTED Final   Streptococcus agalactiae NOT DETECTED NOT DETECTED Final   Streptococcus pneumoniae NOT DETECTED NOT DETECTED Final   Streptococcus pyogenes NOT DETECTED NOT DETECTED Final   A.calcoaceticus-baumannii NOT DETECTED NOT DETECTED Final   Bacteroides fragilis NOT DETECTED NOT DETECTED Final   Enterobacterales NOT DETECTED NOT DETECTED Final   Enterobacter cloacae complex NOT DETECTED NOT DETECTED Final   Escherichia coli NOT DETECTED NOT DETECTED Final   Klebsiella aerogenes NOT DETECTED NOT DETECTED Final   Klebsiella oxytoca NOT DETECTED NOT DETECTED Final   Klebsiella pneumoniae NOT DETECTED NOT DETECTED Final   Proteus species NOT DETECTED NOT DETECTED Final   Salmonella species NOT DETECTED NOT DETECTED Final   Serratia marcescens NOT DETECTED NOT DETECTED Final   Haemophilus influenzae NOT DETECTED NOT DETECTED Final   Neisseria meningitidis NOT DETECTED NOT DETECTED Final   Pseudomonas aeruginosa NOT DETECTED NOT DETECTED Final   Stenotrophomonas maltophilia NOT  DETECTED NOT DETECTED Final   Candida albicans NOT DETECTED NOT DETECTED Final   Candida auris NOT DETECTED NOT DETECTED Final   Candida glabrata NOT DETECTED NOT DETECTED Final   Candida krusei NOT DETECTED NOT DETECTED Final   Candida parapsilosis NOT DETECTED NOT DETECTED Final   Candida tropicalis NOT DETECTED NOT DETECTED Final   Cryptococcus neoformans/gattii NOT DETECTED NOT DETECTED Final   Meth resistant mecA/C and MREJ NOT DETECTED NOT DETECTED Final    Comment: Performed at [facility], [address] [place], [place] [address]  Culture, blood (Routine X 2) w Reflex to ID Panel     Status: Abnormal (Preliminary result)   Collection Time: 11/30/20  8:06 PM   Specimen: BLOOD  Result Value Ref Range Status   Specimen Description   Final    BLOOD BLOOD LEFT HAND Performed at [facility] [address] [place], [place] [address]    Special Requests   Final    BOTTLES DRAWN AEROBIC AND ANAEROBIC Blood Culture results may not be optimal due to an inadequate volume of blood received in culture bottles Performed at [facility] [address] [place], [place] [address]    Culture  Setup Time   Final    GRAM POSITIVE COCCI IN CLUSTERS IN BOTH AEROBIC AND ANAEROBIC BOTTLES CRITICAL VALUE NOTED.  VALUE IS CONSISTENT WITH PREVIOUSLY REPORTED AND CALLED VALUE.    Culture (A)  Final    STAPHYLOCOCCUS AUREUS SUSCEPTIBILITIES PERFORMED ON PREVIOUS CULTURE WITHIN THE LAST 5 DAYS. Performed at [facility], [place] [address], [place], [place] [address]    Report Status PENDING  Incomplete     Studies: ECHOCARDIOGRAM COMPLETE  Result Date: 12/02/2020    ECHOCARDIOGRAM REPORT   Patient Name:   [name] Date of Exam: 12/02/2020 Medical Rec #:  [id number]  Height:       65.0 in Accession #:    [id number]      Weight:       176.1 lb Date of Birth:  1946-10-06       BSA:          1.874 m Patient Age:    19 years        BP:            146/79 mmHg Patient Gender: F               HR:           92 bpm. Exam Location:  Inpatient Procedure: 2D Echo, Cardiac Doppler and Color Doppler Indications:    Bacteremia  History:        Patient has no prior history of Echocardiogram examinations.                 Signs/Symptoms:Bacteremia and Fever; Risk Factors:Hypertension.                 Sepsis.  Sonographer:    Dustin Flock Referring Phys: [phone] [place]  1. Left ventricular ejection fraction, by estimation, is 55 to 60%. The left ventricle has normal function. The left ventricle has no regional wall motion abnormalities. Left ventricular diastolic parameters are consistent with Grade I diastolic dysfunction (impaired relaxation).  2. Right ventricular systolic function is normal. The right ventricular size is normal.  3. The mitral valve is normal in structure. No evidence of mitral valve regurgitation. No evidence of mitral stenosis.  4. The aortic valve is normal in structure. Aortic valve regurgitation is not visualized. No aortic stenosis is present.  5. The inferior vena cava is normal in size with greater than 50% respiratory variability, suggesting right atrial pressure of 3 mmHg. Conclusion(s)/Recommendation(s): No evidence of valvular vegetations on this transthoracic echocardiogram. Would recommend a transesophageal echocardiogram to exclude infective endocarditis if clinically indicated. FINDINGS  Left Ventricle: Left ventricular ejection fraction, by estimation, is 55 to 60%. The left ventricle has normal function. The left ventricle has no regional wall motion abnormalities. The left ventricular internal cavity size was normal in size. There is  no left ventricular hypertrophy. Left ventricular diastolic parameters are consistent with Grade I diastolic dysfunction (impaired relaxation). Indeterminate filling pressures. Right Ventricle: The right ventricular size is normal. No increase in right ventricular wall  thickness. Right ventricular systolic function is normal. Left Atrium: Left atrial size was normal in size. Right Atrium: Right atrial size was normal in size. Pericardium: There is no evidence of pericardial effusion. Mitral Valve: The mitral valve is normal in structure. No evidence of mitral valve regurgitation. No evidence of mitral valve stenosis. Tricuspid Valve: The tricuspid valve is normal in structure. Tricuspid valve regurgitation is not demonstrated. No evidence of tricuspid stenosis. Aortic Valve: The aortic valve is normal in structure. Aortic valve regurgitation is not visualized. No aortic stenosis is present. Pulmonic Valve: The pulmonic valve was normal in structure. Pulmonic valve regurgitation is not visualized. No evidence of pulmonic stenosis. Aorta: The aortic root is normal in size and structure. Venous: The inferior vena cava is normal in size with greater than 50% respiratory variability, suggesting right atrial pressure of 3 mmHg. IAS/Shunts: No atrial level shunt detected by color flow Doppler.  LEFT VENTRICLE PLAX 2D LVIDd:         4.50 cm  Diastology LVIDs:         2.80 cm  LV e' medial:  5.77 cm/s LV PW:         1.10 cm  LV E/e' medial:  15.7 LV IVS:        1.10 cm  LV e' lateral:   9.79 cm/s LVOT diam:     1.90 cm  LV E/e' lateral: 9.3 LV SV:         54 LV SV Index:   29 LVOT Area:     2.84 cm  RIGHT VENTRICLE RV Basal diam:  2.70 cm RV S prime:     13.20 cm/s LEFT ATRIUM           Index       RIGHT ATRIUM           Index LA diam:      3.30 cm 1.76 cm/m  RA Area:     13.20 cm LA Vol (A2C): 29.7 ml 15.85 ml/m RA Volume:   29.00 ml  15.47 ml/m LA Vol (A4C): 34.2 ml 18.25 ml/m  AORTIC VALVE LVOT Vmax:   106.00 cm/s LVOT Vmean:  69.300 cm/s LVOT VTI:    0.189 m  AORTA Ao Root diam: 2.40 cm MITRAL VALVE MV Area (PHT): 8.62 cm    SHUNTS MV Decel Time: 88 msec     Systemic VTI:  0.19 m MV E velocity: 90.70 cm/s  Systemic Diam: 1.90 cm MV A velocity: 99.40 cm/s MV E/A ratio:  0.91  Mihai Croitoru MD Electronically signed by Sanda Klein MD Signature Date/Time: 12/02/2020/3:52:17 PM    Final       Flora Lipps, MD  [facility] 12/03/2020  If 7PM-7AM, please contact night-coverage

## 2020-12-04 ENCOUNTER — Encounter (HOSPITAL_COMMUNITY): Payer: Self-pay | Admitting: Internal Medicine

## 2020-12-04 ENCOUNTER — Inpatient Hospital Stay (HOSPITAL_COMMUNITY): Payer: Medicare HMO | Admitting: Certified Registered Nurse Anesthetist

## 2020-12-04 ENCOUNTER — Inpatient Hospital Stay (HOSPITAL_COMMUNITY): Payer: Medicare HMO

## 2020-12-04 ENCOUNTER — Encounter (HOSPITAL_COMMUNITY)
Admission: EM | Disposition: A | Discharge status: Skilled Nursing Facility | Payer: Self-pay | Source: Home / Self Care | Attending: Internal Medicine

## 2020-12-04 DIAGNOSIS — R7881 Bacteremia: Secondary | ICD-10-CM | POA: Diagnosis not present

## 2020-12-04 DIAGNOSIS — Q211 Atrial septal defect: Secondary | ICD-10-CM | POA: Diagnosis not present

## 2020-12-04 DIAGNOSIS — E785 Hyperlipidemia, unspecified: Secondary | ICD-10-CM

## 2020-12-04 DIAGNOSIS — C211 Malignant neoplasm of anal canal: Secondary | ICD-10-CM

## 2020-12-04 DIAGNOSIS — Z923 Personal history of irradiation: Secondary | ICD-10-CM

## 2020-12-04 DIAGNOSIS — M5416 Radiculopathy, lumbar region: Secondary | ICD-10-CM

## 2020-12-04 HISTORY — PX: TEE WITHOUT CARDIOVERSION: SHX5443

## 2020-12-04 HISTORY — PX: BUBBLE STUDY: SHX6837

## 2020-12-04 LAB — CBC
HCT: 33.6 % — ABNORMAL LOW (ref 36.0–46.0)
Hemoglobin: 11.1 g/dL — ABNORMAL LOW (ref 12.0–15.0)
MCH: 32.6 pg (ref 26.0–34.0)
MCHC: 33 g/dL (ref 30.0–36.0)
MCV: 98.8 fL (ref 80.0–100.0)
Platelets: 196 10*3/uL (ref 150–400)
RBC: 3.4 MIL/uL — ABNORMAL LOW (ref 3.87–5.11)
RDW: 14.1 % (ref 11.5–15.5)
WBC: 7.2 10*3/uL (ref 4.0–10.5)
nRBC: 0 % (ref 0.0–0.2)

## 2020-12-04 SURGERY — ECHOCARDIOGRAM, TRANSESOPHAGEAL
Anesthesia: Monitor Anesthesia Care

## 2020-12-04 MED ORDER — BUTAMBEN-TETRACAINE-BENZOCAINE 2-2-14 % EX AERO
INHALATION_SPRAY | CUTANEOUS | Status: DC | PRN
  Administered 2020-12-04: 1 via TOPICAL

## 2020-12-04 MED ORDER — LIDOCAINE 2% (20 MG/ML) 5 ML SYRINGE
INTRAMUSCULAR | Status: DC | PRN
  Administered 2020-12-04: 60 mg via INTRAVENOUS

## 2020-12-04 MED ORDER — ALPRAZOLAM 0.25 MG PO TABS
0.2500 mg | ORAL_TABLET | Freq: Once | ORAL | Status: AC
  Administered 2020-12-04: 0.25 mg via ORAL
  Filled 2020-12-04: qty 1

## 2020-12-04 MED ORDER — CEFAZOLIN IV (FOR PTA / DISCHARGE USE ONLY): 2.0000 g | Freq: Three times a day (TID) | INTRAVENOUS | 0 refills | Status: AC

## 2020-12-04 MED ORDER — PROPOFOL 500 MG/50ML IV EMUL
INTRAVENOUS | Status: DC | PRN
  Administered 2020-12-04: 100 ug/kg/min via INTRAVENOUS

## 2020-12-04 MED ORDER — SODIUM CHLORIDE 0.9 % IV SOLN: INTRAVENOUS | Status: DC

## 2020-12-04 MED ORDER — LACTATED RINGERS IV SOLN: INTRAVENOUS | Status: DC | PRN

## 2020-12-04 MED ORDER — OXYCODONE HCL 5 MG PO TABS
5.0000 mg | ORAL_TABLET | Freq: Once | ORAL | Status: AC
  Administered 2020-12-04: 5 mg via ORAL
  Filled 2020-12-04: qty 1

## 2020-12-04 MED ORDER — PROPOFOL 10 MG/ML IV BOLUS
INTRAVENOUS | Status: DC | PRN
  Administered 2020-12-04 (×2): 20 mg via INTRAVENOUS

## 2020-12-04 NOTE — Anesthesia Preprocedure Evaluation (Signed)
Anesthesia Evaluation  Patient identified by MRN, date of birth, ID band Patient awake    Reviewed: Allergy & Precautions, NPO status , Patient's Chart, lab work & pertinent test results  History of Anesthesia Complications Negative for: history of anesthetic complications  Airway Mallampati: II  TM Distance: >3 FB Neck ROM: Full    Dental   Pulmonary neg pulmonary ROS, former smoker,    Pulmonary exam normal        Cardiovascular negative cardio ROS Normal cardiovascular exam     Neuro/Psych Depression negative neurological ROS     GI/Hepatic negative GI ROS, Neg liver ROS,   Endo/Other  Hypothyroidism   Renal/GU negative Renal ROS  negative genitourinary   Musculoskeletal negative musculoskeletal ROS (+)   Abdominal   Peds  Hematology  (+) anemia ,   Anesthesia Other Findings  Bacteremia, unknown source  Reproductive/Obstetrics                            Anesthesia Physical Anesthesia Plan  ASA: III  Anesthesia Plan: MAC   Post-op Pain Management:    Induction: Intravenous  PONV Risk Score and Plan: 2 and Propofol infusion, TIVA and Treatment may vary due to age or medical condition  Airway Management Planned: Natural Airway, Nasal Cannula and Simple Face Mask  Additional Equipment: None  Intra-op Plan:   Post-operative Plan:   Informed Consent: I have reviewed the patients History and Physical, chart, labs and discussed the procedure including the risks, benefits and alternatives for the proposed anesthesia with the patient or authorized representative who has indicated his/her understanding and acceptance.       Plan Discussed with:   Anesthesia Plan Comments:         Anesthesia Quick Evaluation

## 2020-12-04 NOTE — Progress Notes (Addendum)
INFECTIOUS DISEASE PROGRESS NOTE  ID: [name] is a 75 y.o. female with  Principal Problem:   SIRS (systemic inflammatory response syndrome) (HCC) Active Problems:   Hyperlipidemia   Malignant neoplasm of anal canal (HCC)   Depression   History of radiation therapy   Lumbar radiculopathy   Fever   Staphylococcus aureus bacteremia with sepsis (HCC)  Subjective: No complaints.   Abtx:  Anti-infectives (From admission, onward)   Start     Dose/Rate Route Frequency Ordered Stop   12/01/20 2200  ceFAZolin (ANCEF) IVPB 2g/100 mL premix        2 g 200 mL/hr over 30 Minutes Intravenous Every 8 hours 12/01/20 1347     12/01/20 2000  vancomycin (VANCOREADY) IVPB 1250 mg/250 mL  Status:  Discontinued        1,250 mg 166.7 mL/hr over 90 Minutes Intravenous Every 24 hours 12/01/20 0005 12/01/20 1347   12/01/20 0400  ceFEPIme (MAXIPIME) 2 g in sodium chloride 0.9 % 100 mL IVPB  Status:  Discontinued        2 g 200 mL/hr over 30 Minutes Intravenous Every 8 hours 11/30/20 2230 12/01/20 1347   11/30/20 2245  metroNIDAZOLE (FLAGYL) IVPB 500 mg  Status:  Discontinued        500 mg 100 mL/hr over 60 Minutes Intravenous Every 8 hours 11/30/20 2230 12/01/20 1347   11/30/20 2245  vancomycin (VANCOCIN) IVPB 1000 mg/200 mL premix  Status:  Discontinued        1,000 mg 200 mL/hr over 60 Minutes Intravenous  Once 11/30/20 2230 11/30/20 2237   11/30/20 1900  vancomycin (VANCOREADY) IVPB 1750 mg/350 mL        1,750 mg 175 mL/hr over 120 Minutes Intravenous STAT 11/30/20 1850 11/30/20 2231   11/30/20 1900  ceFEPIme (MAXIPIME) 2 g in sodium chloride 0.9 % 100 mL IVPB        2 g 200 mL/hr over 30 Minutes Intravenous STAT 11/30/20 1850 11/30/20 2023      Medications:  Scheduled: . amLODipine  5 mg Oral Daily  . ARIPiprazole  10 mg Oral Daily  . DULoxetine  120 mg Oral Daily  . enoxaparin (LOVENOX) injection  40 mg Subcutaneous Q24H  . folic acid  417 mcg Oral Daily  . gabapentin  600 mg  Oral BID  . levothyroxine  25 mcg Oral Q0600  . pravastatin  40 mg Oral Q breakfast  . traZODone  100 mg Oral QHS  . vitamin B-12  500 mcg Oral Daily    Objective: Vital signs in last 24 hours: Temp:  [98.6 F (37 C)] 98.6 F (37 C) (02/03 0603) Pulse Rate:  [94-102] 94 (02/03 0603) Resp:  [18] 18 (02/03 0603) BP: (151-159)/(77-89) 158/89 (02/03 0603) SpO2:  [97 %-98 %] 97 % (02/03 0603)   General appearance: alert, cooperative and no distress Resp: clear to auscultation bilaterally Cardio: regular rate and rhythm GI: normal findings: bowel sounds normal and soft, non-tender  Lab Results Recent Labs    12/02/20 0540 12/03/20 0550 12/04/20 0506  WBC 8.3 6.4 7.2  HGB 11.1* 11.1* 11.1*  HCT 35.7* 33.9* 33.6*  NA 141 138  --   K 4.6 3.9  --   CL 112* 103  --   CO2 18* 22  --   BUN 16 12  --   CREATININE 0.70 0.44  --    Liver Panel No results for input(s): PROT, ALBUMIN, AST, ALT, ALKPHOS, BILITOT, BILIDIR, IBILI in  the last 72 hours. Sedimentation Rate No results for input(s): ESRSEDRATE in the last 72 hours. C-Reactive Protein No results for input(s): CRP in the last 72 hours.  Microbiology: Recent Results (from the past 240 hour(s))  Urine culture     Status: None   Collection Time: 11/30/20  1:44 PM   Specimen: Urine, Random  Result Value Ref Range Status   Specimen Description   Final    URINE, RANDOM Performed at [facility] [address], [place], [place] [address]    Special Requests   Final    NONE Performed at [facility], [place] [address] [place], [place] [address]    Culture   Final    NO GROWTH Performed at [facility], [place] [address] [place], [place] [address]    Report Status 12/01/2020 FINAL  Final  Culture, blood (routine x 2)     Status: Abnormal   Collection Time: 11/30/20  7:25 PM   Specimen: BLOOD  Result Value Ref Range Status   Specimen Description   Final    BLOOD BLOOD LEFT  FOREARM Performed at [facility] [address], [place], [place] [address]    Special Requests   Final    BOTTLES DRAWN AEROBIC AND ANAEROBIC Blood Culture results may not be optimal due to an inadequate volume of blood received in culture bottles   Culture  Setup Time   Final    GRAM POSITIVE COCCI IN CLUSTERS IN BOTH AEROBIC AND ANAEROBIC BOTTLES CRITICAL RESULT CALLED TO, READ BACK BY AND VERIFIED WITH: Melodye Ped PharmD 13:20 12/01/20 ([email or web address]) Performed at [facility], [place] [address], [place], [place] [address]    Culture STAPHYLOCOCCUS AUREUS (A)  Final   Report Status 12/03/2020 FINAL  Final   Organism ID, Bacteria STAPHYLOCOCCUS AUREUS  Final      Susceptibility   Staphylococcus aureus - MIC*    CIPROFLOXACIN <=0.5 SENSITIVE Sensitive     ERYTHROMYCIN <=0.25 SENSITIVE Sensitive     GENTAMICIN <=0.5 SENSITIVE Sensitive     OXACILLIN <=0.25 SENSITIVE Sensitive     TETRACYCLINE <=1 SENSITIVE Sensitive     VANCOMYCIN 1 SENSITIVE Sensitive     TRIMETH/SULFA <=10 SENSITIVE Sensitive     CLINDAMYCIN <=0.25 SENSITIVE Sensitive     RIFAMPIN <=0.5 SENSITIVE Sensitive     Inducible Clindamycin NEGATIVE Sensitive     * STAPHYLOCOCCUS AUREUS  SARS Coronavirus 2 by RT PCR (hospital order, performed in [facility] hospital lab) Nasopharyngeal Nasopharyngeal Swab     Status: None   Collection Time: 11/30/20  7:25 PM   Specimen: Nasopharyngeal Swab  Result Value Ref Range Status   SARS Coronavirus 2 NEGATIVE NEGATIVE Final    Comment: (NOTE) SARS-CoV-2 target nucleic acids are NOT DETECTED.  The SARS-CoV-2 RNA is generally detectable in upper and lower respiratory specimens during the acute phase of infection. The lowest concentration of SARS-CoV-2 viral copies this assay can detect is 250 copies / mL. A negative result does not preclude SARS-CoV-2 infection and should not be used as the sole basis for treatment or other patient management decisions.  A  negative result may occur with improper specimen collection / handling, submission of specimen other than nasopharyngeal swab, presence of viral mutation(s) within the areas targeted by this assay, and inadequate number of viral copies (<250 copies / mL). A negative result must be combined with clinical observations, patient history, and epidemiological information.  Fact Sheet for Patients:  StrictlyIdeas.no  Fact Sheet for Healthcare Providers: BankingDealers.co.za  This test is not yet approved or  cleared by the [place] FDA and has been authorized for detection and/or diagnosis of SARS-CoV-2 by FDA under an Emergency Use Authorization (EUA).  This EUA will remain in effect (meaning this test can be used) for the duration of the COVID-19 declaration under Section 564(b)(1) of the Act, 21 U.S.C. section 360bbb-3(b)(1), unless the authorization is terminated or revoked sooner.  Performed at [facility], [place] [address] [place], [place] [address]   Blood Culture ID Panel (Reflexed)     Status: Abnormal   Collection Time: 11/30/20  7:25 PM  Result Value Ref Range Status   Enterococcus faecalis NOT DETECTED NOT DETECTED Final   Enterococcus Faecium NOT DETECTED NOT DETECTED Final   Listeria monocytogenes NOT DETECTED NOT DETECTED Final   Staphylococcus species DETECTED (A) NOT DETECTED Final    Comment: CRITICAL RESULT CALLED TO, READ BACK BY AND VERIFIED WITH: Melodye Ped PharmD 13:20 12/01/20 ([email or web address])    Staphylococcus aureus (BCID) DETECTED (A) NOT DETECTED Final    Comment: CRITICAL RESULT CALLED TO, READ BACK BY AND VERIFIED WITH: Melodye Ped PharmD 13:20 12/01/20 ([email or web address])    Staphylococcus epidermidis NOT DETECTED NOT DETECTED Final   Staphylococcus lugdunensis NOT DETECTED NOT DETECTED Final   Streptococcus species NOT DETECTED NOT DETECTED Final   Streptococcus agalactiae NOT DETECTED NOT DETECTED  Final   Streptococcus pneumoniae NOT DETECTED NOT DETECTED Final   Streptococcus pyogenes NOT DETECTED NOT DETECTED Final   A.calcoaceticus-baumannii NOT DETECTED NOT DETECTED Final   Bacteroides fragilis NOT DETECTED NOT DETECTED Final   Enterobacterales NOT DETECTED NOT DETECTED Final   Enterobacter cloacae complex NOT DETECTED NOT DETECTED Final   Escherichia coli NOT DETECTED NOT DETECTED Final   Klebsiella aerogenes NOT DETECTED NOT DETECTED Final   Klebsiella oxytoca NOT DETECTED NOT DETECTED Final   Klebsiella pneumoniae NOT DETECTED NOT DETECTED Final   Proteus species NOT DETECTED NOT DETECTED Final   Salmonella species NOT DETECTED NOT DETECTED Final   Serratia marcescens NOT DETECTED NOT DETECTED Final   Haemophilus influenzae NOT DETECTED NOT DETECTED Final   Neisseria meningitidis NOT DETECTED NOT DETECTED Final   Pseudomonas aeruginosa NOT DETECTED NOT DETECTED Final   Stenotrophomonas maltophilia NOT DETECTED NOT DETECTED Final   Candida albicans NOT DETECTED NOT DETECTED Final   Candida auris NOT DETECTED NOT DETECTED Final   Candida glabrata NOT DETECTED NOT DETECTED Final   Candida krusei NOT DETECTED NOT DETECTED Final   Candida parapsilosis NOT DETECTED NOT DETECTED Final   Candida tropicalis NOT DETECTED NOT DETECTED Final   Cryptococcus neoformans/gattii NOT DETECTED NOT DETECTED Final   Meth resistant mecA/C and MREJ NOT DETECTED NOT DETECTED Final    Comment: Performed at [facility], [address] [place], [place] [address]  Culture, blood (Routine X 2) w Reflex to ID Panel     Status: Abnormal   Collection Time: 11/30/20  8:06 PM   Specimen: BLOOD  Result Value Ref Range Status   Specimen Description   Final    BLOOD BLOOD LEFT HAND Performed at [facility] [address] [place], [place] [address]    Special Requests   Final    BOTTLES DRAWN AEROBIC AND ANAEROBIC Blood Culture results may not be optimal due to an inadequate  volume of blood received in culture bottles Performed at [facility] [address], [place], [place] [address]  Culture  Setup Time   Final    GRAM POSITIVE COCCI IN CLUSTERS IN BOTH AEROBIC AND ANAEROBIC BOTTLES CRITICAL VALUE NOTED.  VALUE IS CONSISTENT WITH PREVIOUSLY REPORTED AND CALLED VALUE.    Culture (A)  Final    STAPHYLOCOCCUS AUREUS SUSCEPTIBILITIES PERFORMED ON PREVIOUS CULTURE WITHIN THE LAST 5 DAYS. Performed at [facility], [place] [address] [place], [place] [address]    Report Status 12/03/2020 FINAL  Final  Culture, blood (Routine X 2) w Reflex to ID Panel     Status: None (Preliminary result)   Collection Time: 12/02/20  5:40 AM   Specimen: BLOOD RIGHT HAND  Result Value Ref Range Status   Specimen Description   Final    BLOOD RIGHT HAND Performed at [facility] [address] [place], [place] [address]    Special Requests   Final    BOTTLES DRAWN AEROBIC ONLY Blood Culture results may not be optimal due to an inadequate volume of blood received in culture bottles Performed at [facility] [address], [place], [place] [address]    Culture   Final    NO GROWTH 2 DAYS Performed at [facility] [address], [place], [place] [address]    Report Status PENDING  Incomplete    Studies/Results: ECHOCARDIOGRAM COMPLETE  Result Date: 12/02/2020    ECHOCARDIOGRAM REPORT   Patient Name:   [name] Date of Exam: 12/02/2020 Medical Rec #:  [id number]       Height:       65.0 in Accession #:    [id number]      Weight:       176.1 lb Date of Birth:  [birth year]       BSA:          1.874 m Patient Age:    38 years        BP:           146/79 mmHg Patient Gender: F               HR:           92 bpm. Exam Location:  Inpatient Procedure: 2D Echo, Cardiac Doppler and Color Doppler Indications:    Bacteremia  History:        Patient has no prior history of Echocardiogram examinations.                  Signs/Symptoms:Bacteremia and Fever; Risk Factors:Hypertension.                 Sepsis.  Sonographer:    Dustin Flock Referring Phys: [phone] [place]  1. Left ventricular ejection fraction, by estimation, is 55 to 60%. The left ventricle has normal function. The left ventricle has no regional wall motion abnormalities. Left ventricular diastolic parameters are consistent with Grade I diastolic dysfunction (impaired relaxation).  2. Right ventricular systolic function is normal. The right ventricular size is normal.  3. The mitral valve is normal in structure. No evidence of mitral valve regurgitation. No evidence of mitral stenosis.  4. The aortic valve is normal in structure. Aortic valve regurgitation is not visualized. No aortic stenosis is present.  5. The inferior vena cava is normal in size with greater than 50% respiratory variability, suggesting right atrial pressure of 3 mmHg. Conclusion(s)/Recommendation(s): No evidence of valvular vegetations on this transthoracic echocardiogram. Would recommend a transesophageal echocardiogram to exclude infective endocarditis if clinically indicated. FINDINGS  Left  Ventricle: Left ventricular ejection fraction, by estimation, is 55 to 60%. The left ventricle has normal function. The left ventricle has no regional wall motion abnormalities. The left ventricular internal cavity size was normal in size. There is  no left ventricular hypertrophy. Left ventricular diastolic parameters are consistent with Grade I diastolic dysfunction (impaired relaxation). Indeterminate filling pressures. Right Ventricle: The right ventricular size is normal. No increase in right ventricular wall thickness. Right ventricular systolic function is normal. Left Atrium: Left atrial size was normal in size. Right Atrium: Right atrial size was normal in size. Pericardium: There is no evidence of pericardial effusion. Mitral Valve: The mitral valve is normal in structure.  No evidence of mitral valve regurgitation. No evidence of mitral valve stenosis. Tricuspid Valve: The tricuspid valve is normal in structure. Tricuspid valve regurgitation is not demonstrated. No evidence of tricuspid stenosis. Aortic Valve: The aortic valve is normal in structure. Aortic valve regurgitation is not visualized. No aortic stenosis is present. Pulmonic Valve: The pulmonic valve was normal in structure. Pulmonic valve regurgitation is not visualized. No evidence of pulmonic stenosis. Aorta: The aortic root is normal in size and structure. Venous: The inferior vena cava is normal in size with greater than 50% respiratory variability, suggesting right atrial pressure of 3 mmHg. IAS/Shunts: No atrial level shunt detected by color flow Doppler.  LEFT VENTRICLE PLAX 2D LVIDd:         4.50 cm  Diastology LVIDs:         2.80 cm  LV e' medial:    5.77 cm/s LV PW:         1.10 cm  LV E/e' medial:  15.7 LV IVS:        1.10 cm  LV e' lateral:   9.79 cm/s LVOT diam:     1.90 cm  LV E/e' lateral: 9.3 LV SV:         54 LV SV Index:   29 LVOT Area:     2.84 cm  RIGHT VENTRICLE RV Basal diam:  2.70 cm RV S prime:     13.20 cm/s LEFT ATRIUM           Index       RIGHT ATRIUM           Index LA diam:      3.30 cm 1.76 cm/m  RA Area:     13.20 cm LA Vol (A2C): 29.7 ml 15.85 ml/m RA Volume:   29.00 ml  15.47 ml/m LA Vol (A4C): 34.2 ml 18.25 ml/m  AORTIC VALVE LVOT Vmax:   106.00 cm/s LVOT Vmean:  69.300 cm/s LVOT VTI:    0.189 m  AORTA Ao Root diam: 2.40 cm MITRAL VALVE MV Area (PHT): 8.62 cm    SHUNTS MV Decel Time: 88 msec     Systemic VTI:  0.19 m MV E velocity: 90.70 cm/s  Systemic Diam: 1.90 cm MV A velocity: 99.40 cm/s MV E/A ratio:  0.91 Mihai Croitoru MD Electronically signed by Sanda Klein MD Signature Date/Time: 12/02/2020/3:52:17 PM    Final      Assessment/Plan: Staph bacteremia/MSSA unknown source  R thumb wound fever Encephalopathy  Total days of antibiotics:4 ancef  Will continue ancef  for 2 weeks  Fever from 2-1 resolved, none 2-2 and none so far today TEE- no veg  Repeat BCx sent 12-02-20. pcp to repeat BCx 1 week after termination of anbx.  She asks for help getting out of bed- does she need PT eval?  No Known Allergies  OPAT Orders Discharge antibiotics to be given via PICC line Discharge antibiotics: ancef 2g ivpb q8h  Duration: 14 days End Date: 12-14-20  [facility] Care Per Protocol: please  Home health RN for IV administration and teaching; PICC line care and labs.    Labs weekly while on IV antibiotics: _a_ CBC with differential __ BMP _b_ CMP __ CRP __ ESR __ Vancomycin trough __ CK  _c_ Please pull PIC at completion of IV antibiotics __ Please leave PIC in place until doctor has seen patient or been notified  Fax weekly labs to [phone]  Clinic Follow Up Appt: PCP  Available as needed.          Bobby Rumpf MD, FACP Infectious Diseases (pager) [phone] [email or web address] 12/04/2020, 9:21 AM  LOS: 4 days

## 2020-12-04 NOTE — Progress Notes (Signed)
PROGRESS NOTE  [name] KGM:[id number] DOB: 1946-09-02 DOA: 11/30/2020 PCP: [name], MD   LOS: 4 days   Brief narrative:  [name] is a 75 y.o. female with past medical history of hypothyroidism and depression presented to the hospital with cough and not feeling well for 2 weeks.  Patient had presented to the ED a day before this admission for dysuria and a CT scan of the abdomen was done which showed nothing acute.  She was then discharged home but during the evening, patient was noted to be more febrile and confused and was brought into the back to the hospital.   In the ED, patient was noted to have temperature 103 F, tachycardic with elevated lactic acid levels features concerning for sepsis.  CT angiogram of the chest, abdomen pelvis did not show anything acute.  CT renal study done during earlier visit showed nonobstructing stones and did show chronic compression fractures of the lumbar spine with no change from previous.  CT head was unremarkable.  Covid test was negative.  Blood cultures were drawn and started on sepsis protocol antibiotics were started. Procalcitonin was 1.15.  She was then admitted to hospital for sepsis.  Initial blood cultures showed MSSA sepsis.  There was no obvious source of infection.  2D echocardiogram showed preserved LV function without vegetation.  Currently awaiting for TEE on 2/3.  ID  on board.  Repeat blood cultures have been sent 12/02/2020 negative so far.  Assessment/Plan:  Principal Problem:   Staphylococcus aureus bacteremia with sepsis [facility]) Active Problems:   Hyperlipidemia   Malignant neoplasm of anal canal (HCC)   Depression   History of radiation therapy   Lumbar radiculopathy   SIRS (systemic inflammatory response syndrome) (HCC)   Fever  MSSA bacteremia/sepsis.  Unclear about obvious source of infection. Currently on IV cefazolin.  ID on board.  Transthoracic echocardiogram without vegetation.  Await TEE today.  Repeat  blood culture from 12/02/2020 - so far..  No leukocytosis.  T-max of 64.4 F  Acute metabolic encephalopathy secondary to MSSA infection..  Improved. impaired memory.  Oriented.  History of depression on Abilify and Cymbalta.    Continue.  Anemia of chronic disease.. Elevated MCV.  Monitor CBC. vitamin I34, folic acid within normal limits  Hypothyroidism continue Synthroid.  History of anal cancer stage IIIa.  Followed by oncology as outpatient.  History of radiation treatment.  Elevated blood pressure.  Not on antihypertensive medications at home.  Likely new hypertension.  Has been started on low-dose amlodipine 5 mg daily.  Will continue.  Currently n.p.o.  Hypokalemia,Hypophosphatemia.  Improved   Peripheral neuropathy.   Continue gabapentin duloxetine  Debility, deconditioning, generalized weakness, recent fall. Lives alone, family concerned about back pain and issues and mobility.  PT and OT has seen the patient and recommended skilled nursing facility placement.  Vitamin B12 folate within normal range  DVT prophylaxis: enoxaparin (LOVENOX) injection 40 mg Start: 12/01/20 1000  Code Status: Full code  Family Communication:  None today.  Spoke with the daughter yesterday   Status is: Inpatient  Remains inpatient appropriate because:IV treatments appropriate due to intensity of illness or inability to take PO and Inpatient level of care appropriate due to severity of illness, MSSA sepsis, IV antibiotics, possible need for placement   Dispo: The patient is from: Home              Anticipated d/c is to: Skilled nursing facility placement as per PT  Anticipated d/c date is: 2 to 3 days              Patient currently is not medically stable to d/c.   Barriers to discharge.  Bacteremia requiring IV antibiotic, pending TEE,  possible need for rehabilitation   Difficult to place patient No  Consultants:  ID   Cardiology for TEE  Procedures:  Transthoracic  echo  TEE pending today  Anti-infectives:  . Cefazolin 12/01/2020>  Subjective:  Today, patient was seen and examined at bedside.  Patient denies any fever, chills, nausea, vomiting or shortness of breath.  Awaiting for TEE   Objective: Vitals:   12/04/20 0603 12/04/20 1010  BP: (!) 158/89 (!) 172/75  Pulse: 94 99  Resp: 18 16  Temp: 98.6 F (37 C) 99.1 F (37.3 C)  SpO2: 97% 98%    Intake/Output Summary (Last 24 hours) at 12/04/2020 1015 Last data filed at 12/04/2020 0959 Gross per 24 hour  Intake 800.06 ml  Output 600 ml  Net 200.06 ml   Filed Weights   12/01/20 2035  Weight: 79.9 kg   Body mass index is 29.31 kg/m.   Physical Exam:  General:  Average built, not in obvious distress HENT:   No scleral pallor or icterus noted. Oral mucosa is moist.  Chest:  Clear breath sounds.  Diminished breath sounds bilaterally. No crackles or wheezes.  CVS: S1 &S2 heard. No murmur.  Regular rate and rhythm. Abdomen: Soft, nontender, nondistended.  Bowel sounds are heard.   Extremities: No cyanosis, clubbing or edema.  Peripheral pulses are palpable. Psych: Alert, awake and oriented, normal mood, impaired memory CNS:  No cranial nerve deficits.  Power equal in all extremities.   Skin: Warm and dry.  No rashes noted.   Data Review: I have personally reviewed the following laboratory data and studies,  CBC: Recent Labs  Lab 11/30/20 0951 11/30/20 1925 11/30/20 2225 12/01/20 0255 12/02/20 0540 12/03/20 0550 12/04/20 0506  WBC 12.3* 10.6* 8.5 9.1 8.3 6.4 7.2  NEUTROABS 10.9* 9.8*  --  8.2*  --   --   --   HGB 10.5* 12.6 9.9* 10.6* 11.1* 11.1* 11.1*  HCT 31.3* 38.2 30.8* 32.7* 35.7* 33.9* 33.6*  MCV 97.8 99.7 100.7* 100.9* 104.1* 98.0 98.8  PLT 171 188 146* 162 145* 186 025   Basic Metabolic Panel: Recent Labs  Lab 11/30/20 0951 11/30/20 1925 11/30/20 2225 12/01/20 0255 12/02/20 0540 12/03/20 0550  NA 134* 139  --  137 141 138  K 2.8* 3.3*  --  3.4* 4.6 3.9   CL 100 102  --  107 112* 103  CO2 21* 22  --  21* 18* 22  GLUCOSE 149* 107*  --  108* 120* 119*  BUN 14 14  --  13 16 12   CREATININE 0.73 0.71 0.62 0.74 0.70 0.44  CALCIUM 8.7* 9.5  --  8.3* 8.9 8.9  MG 1.7  --   --   --  2.3 2.0  PHOS  --   --   --   --  2.3* 2.9   Liver Function Tests: Recent Labs  Lab 11/30/20 0951 12/01/20 0255  AST 35 31  ALT 22 25  ALKPHOS 55 55  BILITOT 0.9 0.7  PROT 6.5 6.3*  ALBUMIN 3.7 3.3*   No results for input(s): LIPASE, AMYLASE in the last 168 hours. No results for input(s): AMMONIA in the last 168 hours. Cardiac Enzymes: No results for input(s): CKTOTAL, CKMB, CKMBINDEX, TROPONINI in the  last 168 hours. BNP (last 3 results) No results for input(s): BNP in the last 8760 hours.  ProBNP (last 3 results) No results for input(s): PROBNP in the last 8760 hours.  CBG: Recent Labs  Lab 12/01/20 1800 12/01/20 1855  GLUCAP 60* 90   Recent Results (from the past 240 hour(s))  Urine culture     Status: None   Collection Time: 11/30/20  1:44 PM   Specimen: Urine, Random  Result Value Ref Range Status   Specimen Description   Final    URINE, RANDOM Performed at [facility] [address] [place], [place] [address]    Special Requests   Final    NONE Performed at [facility], [place] [address], [place], [place] [address]    Culture   Final    NO GROWTH Performed at [facility], [place] [address] [place], [place] [address]    Report Status 12/01/2020 FINAL  Final  Culture, blood (routine x 2)     Status: Abnormal   Collection Time: 11/30/20  7:25 PM   Specimen: BLOOD  Result Value Ref Range Status   Specimen Description   Final    BLOOD BLOOD LEFT FOREARM Performed at [facility] [address] [place], [place] [address]    Special Requests   Final    BOTTLES DRAWN AEROBIC AND ANAEROBIC Blood Culture results may not be optimal due to an inadequate volume of blood  received in culture bottles   Culture  Setup Time   Final    GRAM POSITIVE COCCI IN CLUSTERS IN BOTH AEROBIC AND ANAEROBIC BOTTLES CRITICAL RESULT CALLED TO, READ BACK BY AND VERIFIED WITH: Melodye Ped PharmD 13:20 12/01/20 ([email or web address]) Performed at [facility], [place] [address], [place],  [address]    Culture STAPHYLOCOCCUS AUREUS (A)  Final   Report Status 12/03/2020 FINAL  Final   Organism ID, Bacteria STAPHYLOCOCCUS AUREUS  Final      Susceptibility   Staphylococcus aureus - MIC*    CIPROFLOXACIN <=0.5 SENSITIVE Sensitive     ERYTHROMYCIN <=0.25 SENSITIVE Sensitive     GENTAMICIN <=0.5 SENSITIVE Sensitive     OXACILLIN <=0.25 SENSITIVE Sensitive     TETRACYCLINE <=1 SENSITIVE Sensitive     VANCOMYCIN 1 SENSITIVE Sensitive     TRIMETH/SULFA <=10 SENSITIVE Sensitive     CLINDAMYCIN <=0.25 SENSITIVE Sensitive     RIFAMPIN <=0.5 SENSITIVE Sensitive     Inducible Clindamycin NEGATIVE Sensitive     * STAPHYLOCOCCUS AUREUS  SARS Coronavirus 2 by RT PCR (hospital order, performed in [facility] hospital lab) Nasopharyngeal Nasopharyngeal Swab     Status: None   Collection Time: 11/30/20  7:25 PM   Specimen: Nasopharyngeal Swab  Result Value Ref Range Status   SARS Coronavirus 2 NEGATIVE NEGATIVE Final    Comment: (NOTE) SARS-CoV-2 target nucleic acids are NOT DETECTED.  The SARS-CoV-2 RNA is generally detectable in upper and lower respiratory specimens during the acute phase of infection. The lowest concentration of SARS-CoV-2 viral copies this assay can detect is 250 copies / mL. A negative result does not preclude SARS-CoV-2 infection and should not be used as the sole basis for treatment or other patient management decisions.  A negative result may occur with improper specimen collection / handling, submission of specimen other than nasopharyngeal swab, presence of viral mutation(s) within the areas targeted by this assay, and inadequate number of viral copies (<250  copies / mL). A negative  result must be combined with clinical observations, patient history, and epidemiological information.  Fact Sheet for Patients:   StrictlyIdeas.no  Fact Sheet for Healthcare Providers: BankingDealers.co.za  This test is not yet approved or  cleared by the [place] FDA and has been authorized for detection and/or diagnosis of SARS-CoV-2 by FDA under an Emergency Use Authorization (EUA).  This EUA will remain in effect (meaning this test can be used) for the duration of the COVID-19 declaration under Section 564(b)(1) of the Act, 21 U.S.C. section 360bbb-3(b)(1), unless the authorization is terminated or revoked sooner.  Performed at [facility], [place] [address] [place], [place] [address]   Blood Culture ID Panel (Reflexed)     Status: Abnormal   Collection Time: 11/30/20  7:25 PM  Result Value Ref Range Status   Enterococcus faecalis NOT DETECTED NOT DETECTED Final   Enterococcus Faecium NOT DETECTED NOT DETECTED Final   Listeria monocytogenes NOT DETECTED NOT DETECTED Final   Staphylococcus species DETECTED (A) NOT DETECTED Final    Comment: CRITICAL RESULT CALLED TO, READ BACK BY AND VERIFIED WITH: Melodye Ped PharmD 13:20 12/01/20 ([email or web address])    Staphylococcus aureus (BCID) DETECTED (A) NOT DETECTED Final    Comment: CRITICAL RESULT CALLED TO, READ BACK BY AND VERIFIED WITH: Melodye Ped PharmD 13:20 12/01/20 ([email or web address])    Staphylococcus epidermidis NOT DETECTED NOT DETECTED Final   Staphylococcus lugdunensis NOT DETECTED NOT DETECTED Final   Streptococcus species NOT DETECTED NOT DETECTED Final   Streptococcus agalactiae NOT DETECTED NOT DETECTED Final   Streptococcus pneumoniae NOT DETECTED NOT DETECTED Final   Streptococcus pyogenes NOT DETECTED NOT DETECTED Final   A.calcoaceticus-baumannii NOT DETECTED NOT DETECTED Final   Bacteroides fragilis NOT DETECTED NOT DETECTED Final    Enterobacterales NOT DETECTED NOT DETECTED Final   Enterobacter cloacae complex NOT DETECTED NOT DETECTED Final   Escherichia coli NOT DETECTED NOT DETECTED Final   Klebsiella aerogenes NOT DETECTED NOT DETECTED Final   Klebsiella oxytoca NOT DETECTED NOT DETECTED Final   Klebsiella pneumoniae NOT DETECTED NOT DETECTED Final   Proteus species NOT DETECTED NOT DETECTED Final   Salmonella species NOT DETECTED NOT DETECTED Final   Serratia marcescens NOT DETECTED NOT DETECTED Final   Haemophilus influenzae NOT DETECTED NOT DETECTED Final   Neisseria meningitidis NOT DETECTED NOT DETECTED Final   Pseudomonas aeruginosa NOT DETECTED NOT DETECTED Final   Stenotrophomonas maltophilia NOT DETECTED NOT DETECTED Final   Candida albicans NOT DETECTED NOT DETECTED Final   Candida auris NOT DETECTED NOT DETECTED Final   Candida glabrata NOT DETECTED NOT DETECTED Final   Candida krusei NOT DETECTED NOT DETECTED Final   Candida parapsilosis NOT DETECTED NOT DETECTED Final   Candida tropicalis NOT DETECTED NOT DETECTED Final   Cryptococcus neoformans/gattii NOT DETECTED NOT DETECTED Final   Meth resistant mecA/C and MREJ NOT DETECTED NOT DETECTED Final    Comment: Performed at [facility], [address], [place], [place] [address]  Culture, blood (Routine X 2) w Reflex to ID Panel     Status: Abnormal   Collection Time: 11/30/20  8:06 PM   Specimen: BLOOD  Result Value Ref Range Status   Specimen Description   Final    BLOOD BLOOD LEFT HAND Performed at [facility] [address], [place], [place] [address]    Special Requests   Final    BOTTLES DRAWN AEROBIC AND ANAEROBIC Blood Culture results may not be optimal due to an inadequate volume of blood  received in culture bottles Performed at [facility], [place] [address], [place], [place] [address]    Culture  Setup Time   Final    GRAM POSITIVE COCCI IN CLUSTERS IN BOTH AEROBIC AND ANAEROBIC  BOTTLES CRITICAL VALUE NOTED.  VALUE IS CONSISTENT WITH PREVIOUSLY REPORTED AND CALLED VALUE.    Culture (A)  Final    STAPHYLOCOCCUS AUREUS SUSCEPTIBILITIES PERFORMED ON PREVIOUS CULTURE WITHIN THE LAST 5 DAYS. Performed at [facility], [place] [address], [place], [place] [address]    Report Status 12/03/2020 FINAL  Final  Culture, blood (Routine X 2) w Reflex to ID Panel     Status: None (Preliminary result)   Collection Time: 12/02/20  5:40 AM   Specimen: BLOOD RIGHT HAND  Result Value Ref Range Status   Specimen Description   Final    BLOOD RIGHT HAND Performed at [facility] [address] [place], [place] [address]    Special Requests   Final    BOTTLES DRAWN AEROBIC ONLY Blood Culture results may not be optimal due to an inadequate volume of blood received in culture bottles Performed at [facility] [address] [place], [place] [address]    Culture   Final    NO GROWTH 2 DAYS Performed at [facility] [address], [place], [place] [address]    Report Status PENDING  Incomplete     Studies: ECHOCARDIOGRAM COMPLETE  Result Date: 12/02/2020    ECHOCARDIOGRAM REPORT   Patient Name:   LYNANNE VANDERWERFF Date of Exam: 12/02/2020 Medical Rec #:  [id number]       Height:       65.0 in Accession #:    [id number]      Weight:       176.1 lb Date of Birth:  [birth year]       BSA:          1.874 m Patient Age:    64 years        BP:           146/79 mmHg Patient Gender: F               HR:           92 bpm. Exam Location:  Inpatient Procedure: 2D Echo, Cardiac Doppler and Color Doppler Indications:    Bacteremia  History:        Patient has no prior history of Echocardiogram examinations.                 Signs/Symptoms:Bacteremia and Fever; Risk Factors:Hypertension.                 Sepsis.  Sonographer:    Dustin Flock Referring Phys: [phone] [place]  1. Left ventricular ejection fraction, by estimation, is 55 to  60%. The left ventricle has normal function. The left ventricle has no regional wall motion abnormalities. Left ventricular diastolic parameters are consistent with Grade I diastolic dysfunction (impaired relaxation).  2. Right ventricular systolic function is normal. The right ventricular size is normal.  3. The mitral valve is normal in structure. No evidence of mitral valve regurgitation. No evidence of mitral stenosis.  4. The aortic valve is normal in structure. Aortic valve regurgitation is not visualized. No aortic stenosis is present.  5. The inferior vena cava is normal in size with greater than 50% respiratory variability, suggesting right atrial pressure of 3 mmHg. Conclusion(s)/Recommendation(s): No evidence  of valvular vegetations on this transthoracic echocardiogram. Would recommend a transesophageal echocardiogram to exclude infective endocarditis if clinically indicated. FINDINGS  Left Ventricle: Left ventricular ejection fraction, by estimation, is 55 to 60%. The left ventricle has normal function. The left ventricle has no regional wall motion abnormalities. The left ventricular internal cavity size was normal in size. There is  no left ventricular hypertrophy. Left ventricular diastolic parameters are consistent with Grade I diastolic dysfunction (impaired relaxation). Indeterminate filling pressures. Right Ventricle: The right ventricular size is normal. No increase in right ventricular wall thickness. Right ventricular systolic function is normal. Left Atrium: Left atrial size was normal in size. Right Atrium: Right atrial size was normal in size. Pericardium: There is no evidence of pericardial effusion. Mitral Valve: The mitral valve is normal in structure. No evidence of mitral valve regurgitation. No evidence of mitral valve stenosis. Tricuspid Valve: The tricuspid valve is normal in structure. Tricuspid valve regurgitation is not demonstrated. No evidence of tricuspid stenosis. Aortic  Valve: The aortic valve is normal in structure. Aortic valve regurgitation is not visualized. No aortic stenosis is present. Pulmonic Valve: The pulmonic valve was normal in structure. Pulmonic valve regurgitation is not visualized. No evidence of pulmonic stenosis. Aorta: The aortic root is normal in size and structure. Venous: The inferior vena cava is normal in size with greater than 50% respiratory variability, suggesting right atrial pressure of 3 mmHg. IAS/Shunts: No atrial level shunt detected by color flow Doppler.  LEFT VENTRICLE PLAX 2D LVIDd:         4.50 cm  Diastology LVIDs:         2.80 cm  LV e' medial:    5.77 cm/s LV PW:         1.10 cm  LV E/e' medial:  15.7 LV IVS:        1.10 cm  LV e' lateral:   9.79 cm/s LVOT diam:     1.90 cm  LV E/e' lateral: 9.3 LV SV:         54 LV SV Index:   29 LVOT Area:     2.84 cm  RIGHT VENTRICLE RV Basal diam:  2.70 cm RV S prime:     13.20 cm/s LEFT ATRIUM           Index       RIGHT ATRIUM           Index LA diam:      3.30 cm 1.76 cm/m  RA Area:     13.20 cm LA Vol (A2C): 29.7 ml 15.85 ml/m RA Volume:   29.00 ml  15.47 ml/m LA Vol (A4C): 34.2 ml 18.25 ml/m  AORTIC VALVE LVOT Vmax:   106.00 cm/s LVOT Vmean:  69.300 cm/s LVOT VTI:    0.189 m  AORTA Ao Root diam: 2.40 cm MITRAL VALVE MV Area (PHT): 8.62 cm    SHUNTS MV Decel Time: 88 msec     Systemic VTI:  0.19 m MV E velocity: 90.70 cm/s  Systemic Diam: 1.90 cm MV A velocity: 99.40 cm/s MV E/A ratio:  0.91 Mihai Croitoru MD Electronically signed by Sanda Klein MD Signature Date/Time: 12/02/2020/3:52:17 PM    Final       Flora Lipps, MD  [facility] 12/04/2020  If 7PM-7AM, please contact night-coverage

## 2020-12-04 NOTE — Interval H&P Note (Signed)
History and Physical Interval Note:  12/04/2020 10:26 AM  [name]  has presented today for surgery, with the diagnosis of BACTERIMIA.  The various methods of treatment have been discussed with the patient and family. After consideration of risks, benefits and other options for treatment, the patient has consented to  Procedure(s): TRANSESOPHAGEAL ECHOCARDIOGRAM (TEE) (N/A) as a surgical intervention.  The patient's history has been reviewed, patient examined, no change in status, stable for surgery.  I have reviewed the patient's chart and labs.  Questions were answered to the patient's satisfaction.     [name]

## 2020-12-04 NOTE — Progress Notes (Signed)
    [facility] has been requested to perform a transesophageal echocardiogram on 12/04/20 for bacteremia.  After careful review of history and examination, the risks and benefits of transesophageal echocardiogram have been explained on 12/02/20 including risks of esophageal damage, perforation (1:10,000 risk), bleeding, pharyngeal hematoma as well as other potential complications associated with conscious sedation including aspiration, arrhythmia, respiratory failure and death. Alternatives to treatment were discussed, questions were answered. Patient is willing to proceed.   TEE scheduled for 12/04/20 at 11am with Dr. Margaretann Loveless.  Roby Lofts, PA-C 12/04/2020 10:21 AM

## 2020-12-04 NOTE — Transfer of Care (Signed)
Immediate Anesthesia Transfer of Care Note  Patient: ZAMARIYAH FURUKAWA  Procedure(s) Performed: TRANSESOPHAGEAL ECHOCARDIOGRAM (TEE) (N/A ) BUBBLE STUDY  Patient Location: Endoscopy Unit  Anesthesia Type:MAC  Level of Consciousness: drowsy  Airway & Oxygen Therapy: Patient Spontanous Breathing and Patient connected to nasal cannula oxygen  Post-op Assessment: Report given to RN and Post -op Vital signs reviewed and stable  Post vital signs: Reviewed and stable  Last Vitals:  Vitals Value Taken Time  BP 166/57 12/04/20 1120  Temp    Pulse 92 12/04/20 1121  Resp 17 12/04/20 1121  SpO2 99 % 12/04/20 1121  Vitals shown include unvalidated device data.  Last Pain:  Vitals:   12/04/20 1010  TempSrc:   PainSc: 0-No pain      Patients Stated Pain Goal: 2 (38/33/38 3291)  Complications: No complications documented.

## 2020-12-04 NOTE — Anesthesia Postprocedure Evaluation (Signed)
Anesthesia Post Note  [name]  Procedure(s) Performed: TRANSESOPHAGEAL ECHOCARDIOGRAM (TEE) (N/A ) BUBBLE STUDY     Patient location during evaluation: Endoscopy Anesthesia Type: MAC Level of consciousness: awake and alert Pain management: pain level controlled Vital Signs Assessment: post-procedure vital signs reviewed and stable Respiratory status: spontaneous breathing, nonlabored ventilation and respiratory function stable Cardiovascular status: blood pressure returned to baseline and stable Postop Assessment: no apparent nausea or vomiting Anesthetic complications: no   No complications documented.  Last Vitals:  Vitals:   12/04/20 1130 12/04/20 1140  BP: (!) 176/78 (!) 157/57  Pulse: (!) 103 95  Resp: 20 (!) 24  Temp:    SpO2: 100% 100%    Last Pain:  Vitals:   12/04/20 1120  TempSrc: Tympanic  PainSc: 0-No pain                 Lidia Collum

## 2020-12-04 NOTE — Progress Notes (Signed)
  Echocardiogram Echocardiogram Transesophageal has been performed.  Jennette Dubin 12/04/2020, 11:34 AM

## 2020-12-04 NOTE — TOC Progression Note (Signed)
Transition of Care [facility]) - Progression Note    Patient Details  Name: [name]: [id number] Date of Birth: 24-Dec-1945  [facility] CM/SW Contact  Barnard Sharps, Juliann Pulse, RN Phone Number: 12/04/2020, 9:34 AM  Clinical Narrative:  [place] chosen-will start auth within 24hrs of d/c, will need covid within 24hrs of d/c.MD notified.     Expected Discharge Plan: [place] Barriers to Discharge: Continued Medical Work up  Expected Discharge Plan and Services Expected Discharge Plan: [place]   Discharge Planning Services: CM Consult Post Acute Care Choice: [place] Living arrangements for the past 2 months: Single Family Home                                       Social Determinants of Health (SDOH) Interventions    Readmission Risk Interventions No flowsheet data found.

## 2020-12-04 NOTE — Progress Notes (Signed)
PHARMACY CONSULT NOTE FOR:  OUTPATIENT  PARENTERAL ANTIBIOTIC THERAPY (OPAT)  Indication: MSSA Bacteremia  Regimen: cefazolin 2g IV q8h End date: 12/14/2020  IV antibiotic discharge orders are pended. To discharging provider:  please sign these orders via discharge navigator,  Select New Orders & click on the button choice - Manage This Unsigned Work.     Thank you for allowing pharmacy to be a part of this patient's care.  Phillis Haggis 12/04/2020, 2:58 PM

## 2020-12-04 NOTE — CV Procedure (Signed)
INDICATIONS: Bacteremia  PROCEDURE:   Informed consent was obtained prior to the procedure. The risks, benefits and alternatives for the procedure were discussed and the patient comprehended these risks.  Risks include, but are not limited to, cough, sore throat, vomiting, nausea, somnolence, esophageal and stomach trauma or perforation, bleeding, low blood pressure, aspiration, pneumonia, infection, trauma to the teeth and death.    After a procedural time-out, the oropharynx was anesthetized with 20% benzocaine spray.   During this procedure the patient was administered propofol per anesthesia.  The patient's heart rate, blood pressure, and oxygen saturation were monitored continuously during the procedure. The period of conscious sedation was 35 minutes, of which I was present face-to-face 100% of this time.  The transesophageal probe was inserted in the esophagus and stomach without difficulty and multiple views were obtained.  The patient was kept under observation until the patient left the procedure room.  The patient left the procedure room in stable condition.   Agitated microbubble saline contrast was administered.  COMPLICATIONS:    There were no immediate complications.  FINDINGS:   FORMAL ECHOCARDIOGRAM REPORT PENDING LVEF 60-65% Normal RV size and function.  Trivial MR, TR.  No valvular vegetations.  No LA appendage thrombus +PFO with agitated saline, in less than 5 cardiac cycles, interatrial shunt.   RECOMMENDATIONS:     Can return to hospital room when alert.   Time Spent Directly with the Patient:  45 minutes   [place] 12/04/2020, 11:19 AM

## 2020-12-04 NOTE — Anesthesia Procedure Notes (Signed)
Procedure Name: MAC Date/Time: 12/04/2020 10:44 AM Performed by: Colin Benton, CRNA Pre-anesthesia Checklist: Patient identified, Emergency Drugs available, Suction available and Patient being monitored Patient Re-evaluated:Patient Re-evaluated prior to induction Oxygen Delivery Method: Nasal cannula Induction Type: IV induction Airway Equipment and Method: Bite block Placement Confirmation: positive ETCO2 Dental Injury: Teeth and Oropharynx as per pre-operative assessment

## 2020-12-05 ENCOUNTER — Inpatient Hospital Stay: Payer: Self-pay

## 2020-12-05 DIAGNOSIS — A4101 Sepsis due to Methicillin susceptible Staphylococcus aureus: Principal | ICD-10-CM

## 2020-12-05 LAB — CBC
HCT: 36 % (ref 36.0–46.0)
Hemoglobin: 11.8 g/dL — ABNORMAL LOW (ref 12.0–15.0)
MCH: 32 pg (ref 26.0–34.0)
MCHC: 32.8 g/dL (ref 30.0–36.0)
MCV: 97.6 fL (ref 80.0–100.0)
Platelets: 221 10*3/uL (ref 150–400)
RBC: 3.69 MIL/uL — ABNORMAL LOW (ref 3.87–5.11)
RDW: 13.9 % (ref 11.5–15.5)
WBC: 9.5 10*3/uL (ref 4.0–10.5)
nRBC: 0 % (ref 0.0–0.2)

## 2020-12-05 LAB — BASIC METABOLIC PANEL
Anion gap: 16 — ABNORMAL HIGH (ref 5–15)
BUN: 16 mg/dL (ref 8–23)
CO2: 27 mmol/L (ref 22–32)
Calcium: 9.3 mg/dL (ref 8.9–10.3)
Chloride: 97 mmol/L — ABNORMAL LOW (ref 98–111)
Creatinine, Ser: 0.51 mg/dL (ref 0.44–1.00)
GFR, Estimated: >60 mL/min (ref >60)
Glucose, Bld: 101 mg/dL — ABNORMAL HIGH (ref 70–99)
Potassium: 3.7 mmol/L (ref 3.5–5.1)
Sodium: 140 mmol/L (ref 135–145)

## 2020-12-05 LAB — MAGNESIUM: Magnesium: 2.2 mg/dL (ref 1.7–2.4)

## 2020-12-05 LAB — SARS CORONAVIRUS 2 (TAT 6-24 HRS): SARS Coronavirus 2: NEGATIVE

## 2020-12-05 MED ORDER — SODIUM CHLORIDE 0.9 % IV SOLN
INTRAVENOUS | Status: DC | PRN
  Administered 2020-12-05: 250 mL via INTRAVENOUS

## 2020-12-05 MED ORDER — SODIUM CHLORIDE 0.9% FLUSH: 10.0000 mL | Freq: Two times a day (BID) | INTRAVENOUS | Status: DC

## 2020-12-05 MED ORDER — CHLORHEXIDINE GLUCONATE CLOTH 2 % EX PADS
6.0000 | MEDICATED_PAD | Freq: Every day | CUTANEOUS | Status: DC
  Administered 2020-12-05 – 2020-12-06 (×2): 6 via TOPICAL

## 2020-12-05 MED ORDER — AMLODIPINE BESYLATE 5 MG PO TABS: 5.0000 mg | ORAL_TABLET | Freq: Every day | ORAL | Status: DC

## 2020-12-05 MED ORDER — HEPARIN SOD (PORK) LOCK FLUSH 100 UNIT/ML IV SOLN
250.0000 [IU] | INTRAVENOUS | Status: AC | PRN
  Administered 2020-12-05: 250 [IU]
  Filled 2020-12-05: qty 2.5

## 2020-12-05 MED ORDER — SODIUM CHLORIDE 0.9% FLUSH: 10.0000 mL | INTRAVENOUS | Status: DC | PRN

## 2020-12-05 MED ORDER — LIP MEDEX EX OINT
TOPICAL_OINTMENT | CUTANEOUS | Status: DC | PRN
  Filled 2020-12-05: qty 7

## 2020-12-05 NOTE — Progress Notes (Signed)
PICC consent obtained from daughter via telephone.

## 2020-12-05 NOTE — Plan of Care (Signed)

## 2020-12-05 NOTE — Progress Notes (Signed)
Peripherally Inserted Central Catheter Placement  The IV Nurse has discussed with the patient and/or persons authorized to consent for the patient, the purpose of this procedure and the potential benefits and risks involved with this procedure.  The benefits include less needle sticks, lab draws from the catheter, and the patient may be discharged home with the catheter. Risks include, but not limited to, infection, bleeding, blood clot (thrombus formation), and puncture of an artery; nerve damage and irregular heartbeat and possibility to perform a PICC exchange if needed/ordered by physician.  Alternatives to this procedure were also discussed.  Bard Power PICC patient education guide, fact sheet on infection prevention and patient information card has been provided to patient /or left at bedside.    PICC Placement Documentation  PICC Single Lumen 12/05/20 PICC 35 cm 0 cm (Active)  Indication for Insertion or Continuance of Line Home intravenous therapies (PICC only) 12/05/20 1200  Exposed Catheter (cm) 0 cm 12/05/20 1200  Site Assessment Clean;Dry;Intact 12/05/20 1200  Line Status Flushed;Blood return noted 12/05/20 1200  Dressing Type Transparent 12/05/20 1200  Dressing Status Clean;Dry;Intact 12/05/20 1200  Antimicrobial disc in place? Yes 12/05/20 1200  Dressing Intervention New dressing 12/05/20 1200  Dressing Change Due 12/12/20 12/05/20 1200    Telephone consent   Jule Economy Horton 12/05/2020, 12:57 PM

## 2020-12-05 NOTE — TOC Transition Note (Addendum)
Transition of Care [facility]) - CM/SW Discharge Note   Patient Details  Name: [name]: [id number] Date of Birth: 19-Jun-1946  [facility] CM/SW Contact:  Dessa Phi, RN Phone Number: 12/05/2020, 10:41 AM   Clinical Narrative:Spoke to dtr April-informed of [facility] having a bed, & facility will get auth-we will await auth,covid,picc, d/c summary. [facility] rep Juliann Pulse will start auth since insurance is a NON [employer] PTAR for transport once all items in place-await rm#, tel# for report.  12p-[facility]-accepted,have auth-patient going to rm#[id number],nsg call report tel#[phone]. Awaiting d/c summary,picc,covid. PTAR on WILL CALL-Nsg will PTAR when ready. No further CM needs. 2:44p-[facility] rep Juliann Pulse can accept up until 10p-Nsg notified. 3:30p-d/c summary sent to [facility] covid results.Nsg can call PTAR once ready. No further CM needs.      Final next level of care: Skilled Nursing Facility Barriers to Discharge: Insurance Authorization   Patient Goals and CMS Choice Patient states their goals for this hospitalization and ongoing recovery are:: go to rehab CMS Medicare.gov Compare Post Acute Care list provided to:: Patient Choice offered to / list presented to : Patient  Discharge Placement              Patient chooses bed at: [facility] Patient to be transferred to facility by: [facility] Name of family member notified: April Patient and family notified of of transfer: 12/05/20  Discharge Plan and Services   Discharge Planning Services: CM Consult Post Acute Care Choice: [facility]                               Social Determinants of Health (SDOH) Interventions     Readmission Risk Interventions No flowsheet data found.

## 2020-12-05 NOTE — Discharge Summary (Signed)
Physician Discharge Summary  [name] IRS:[id number] DOB: 1946-01-23 DOA: 11/30/2020  PCP: [name], MD  Admit date: 11/30/2020 Discharge date: 12/05/2020  Admitted From: Home  Discharge disposition: SNF   Recommendations for Outpatient Follow-Up:   . Follow up with your primary care provider at the skilled nursing facility in 3 to 5 days. . Continue IV antibiotics as instructed including blood work  Discharge Diagnosis:   Principal Problem:   Staphylococcus aureus bacteremia with sepsis ([place]) Active Problems:   Hyperlipidemia   Malignant neoplasm of anal canal (HCC)   Depression   History of radiation therapy   Lumbar radiculopathy   SIRS (systemic inflammatory response syndrome) ([place])   Fever   Discharge Condition: Improved.  Diet recommendation:   Regular.  Wound care: None.  Code status: Full.   History of Present Illness:   [name] a 75 y.o.femalewithpast medical history of hypothyroidism and depression presented to the hospital with cough and not feeling well for 2 weeks.  Patient had presented to the ED a day before this admission for dysuria and a CT scan of the abdomen was done which showed nothing acute.  She was then discharged home but during the evening, patient was noted to be more febrile and confused and was brought into the back to the hospital.   In the ED, patient was noted to have temperature 103 F, tachycardic with elevated lactic acid levels features concerning for sepsis. CT angiogram of the chest, abdomen pelvis did not show anything acute. CT renal study done during earlier visit showed nonobstructing stones and did show chronic compression fractures of the lumbar spine with no change from previous. CT head was unremarkable. Covid test was negative. Blood cultures were drawn and started on sepsis protocol antibiotics were started. Procalcitonin was 1.15.  She was then admitted to hospital for sepsis.   Hospital  Course:   Following conditions were addressed during hospitalization as listed below,  MSSA bacteremia/sepsis.  Unclear about obvious source of infection. Currently on IV cefazolin.  ID on was consulted. Transthoracic  2 D echocardiogram without vegetation.  There was no obvious source of infection.  2D echocardiogram showed preserved LV function without vegetation.  She then underwent TEE on 2/3 without vegetation  Repeat blood culture from 12/02/2020 - so far. PICC line was placed in.  Patient will be continued on cefazolin until  12/14/2020 to complete 14-day course. No leukocytosis this time.  Patient is stable  Acute metabolic encephalopathy secondary to MSSA infection..  Improved. impaired memory at baseline.  History of depression on Abilify and Cymbalta.   Continue.  Anemia of chronic disease.  vitamin K09, folic acid within normal limits  Hypothyroidism continue Synthroid.  History of anal cancer stage IIIa.  Followed by oncology as outpatient.  History of radiation treatment.  Elevated blood pressure.  Not on antihypertensive medications at home.  Likely new hypertension.  Has been started on low-dose amlodipine 5 mg daily.  Will continue on discharge.  Hypokalemia,Hypophosphatemia.  Improved   Peripheral neuropathy.   Continue gabapentin, duloxetine  Debility, deconditioning, generalized weakness, recent fall. Lives alone, family concerned about back pain and issues and mobility.  PT and OT has seen the patient and recommended skilled nursing facility placement.     Disposition.  At this time, patient is stable for disposition to skilled nursing facility.   Medical Consultants:    ID   Cardiology Procedures:    TEE on 12/04/2020 PICC line placement 12/05/2020  Subjective:  Today, patient patient denies any dizziness, lightheadedness, shortness of breath, chest pain palpitation.  Discharge Exam:   Vitals:   12/04/20 2017 12/05/20 0521  BP: (!) 157/86 (!)  157/81  Pulse: 100 85  Resp: 18 18  Temp: 99.4 F (37.4 C) 99 F (37.2 C)  SpO2: 98% 97%   Vitals:   12/04/20 1140 12/04/20 1235 12/04/20 2017 12/05/20 0521  BP: (!) 157/57 (!) 164/77 (!) 157/86 (!) 157/81  Pulse: 95 100 100 85  Resp: (!) '24 18 18 18  ' Temp:  99.3 F (37.4 C) 99.4 F (37.4 C) 99 F (37.2 C)  TempSrc:  Oral Oral Oral  SpO2: 100% 97% 98% 97%  Weight:      Height:       General: Alert awake, not in obvious distress HENT: pupils equally reacting to light,  No scleral pallor or icterus noted. Oral mucosa is moist.  Chest:  Clear breath sounds.  Diminished breath sounds bilaterally. No crackles or wheezes.  CVS: S1 &S2 heard. No murmur.  Regular rate and rhythm. Abdomen: Soft, nontender, nondistended.  Bowel sounds are heard.   Extremities: No cyanosis, clubbing or edema.  Peripheral pulses are palpable. Psych: Alert, awake and oriented, normal mood, impaired memory CNS:  No cranial nerve deficits.  Power equal in all extremities.   Skin: Warm and dry.  No rashes noted.  The results of significant diagnostics from this hospitalization (including imaging, microbiology, ancillary and laboratory) are listed below for reference.     Diagnostic Studies:   CT Head Wo Contrast  Result Date: 11/30/2020 CLINICAL DATA:  Delirium.  Discharged today. EXAM: CT HEAD WITHOUT CONTRAST TECHNIQUE: Contiguous axial images were obtained from the base of the skull through the vertex without intravenous contrast. COMPARISON:  None. FINDINGS: Brain: Normal for age atrophy. No intracranial hemorrhage, mass effect, or midline shift. No hydrocephalus. The basilar cisterns are patent. No evidence of territorial infarct or acute ischemia. No extra-axial or intracranial fluid collection. Vascular: Atherosclerosis of skullbase vasculature without hyperdense vessel or abnormal calcification. Skull: No fracture or focal lesion. Sinuses/Orbits: Minor mucosal thickening of ethmoid air cells. No sinus  air-fluid levels. Mastoid air cells are clear. Orbits are unremarkable. Other: None. IMPRESSION: 1. No acute intracranial abnormality. 2. Normal for age atrophy. Electronically Signed   By: Keith Rake M.D.   On: 11/30/2020 21:53   CT Angio Chest PE W and/or Wo Contrast  Result Date: 11/30/2020 CLINICAL DATA:  Pain and fever. EXAM: CT ANGIOGRAPHY CHEST CT ABDOMEN AND PELVIS WITH CONTRAST TECHNIQUE: Multidetector CT imaging of the chest was performed using the standard protocol during bolus administration of intravenous contrast. Multiplanar CT image reconstructions and MIPs were obtained to evaluate the vascular anatomy. Multidetector CT imaging of the abdomen and pelvis was performed using the standard protocol during bolus administration of intravenous contrast. CONTRAST:  157m OMNIPAQUE IOHEXOL 350 MG/ML SOLN COMPARISON:  CT dated November 30, 2020 FINDINGS: CTA CHEST FINDINGS Cardiovascular: Contrast injection is sufficient to demonstrate satisfactory opacification of the pulmonary arteries to the segmental level. There is no pulmonary embolus or evidence of right heart strain. The size of the main pulmonary artery is normal. Heart size is normal, with no pericardial effusion. The course and caliber of the aorta are normal. There is mild atherosclerotic calcification. Opacification decreased due to pulmonary arterial phase contrast bolus timing. Mediastinum/Nodes: -- No mediastinal lymphadenopathy. -- No hilar lymphadenopathy. -- No axillary lymphadenopathy. -- No supraclavicular lymphadenopathy. -- Normal thyroid gland where visualized. -  Unremarkable  esophagus. Lungs/Pleura: Airways are patent. No pleural effusion, lobar consolidation, pneumothorax or pulmonary infarction. Musculoskeletal: There are old left-sided rib fractures. No acute displaced fracture. Review of the MIP images confirms the above findings. CT ABDOMEN and PELVIS FINDINGS Hepatobiliary: The liver is normal. Normal gallbladder.There  is no biliary ductal dilation. Pancreas: Normal contours without ductal dilatation. No peripancreatic fluid collection. Spleen: Unremarkable. Adrenals/Urinary Tract: --Adrenal glands: Unremarkable. --Right kidney/ureter: There is a nonobstructing 4 mm stone in the interpolar region of the right kidney. --Left kidney/ureter: No hydronephrosis or radiopaque kidney stones. --Urinary bladder: The urinary bladder is distended. Stomach/Bowel: --Stomach/Duodenum: No hiatal hernia or other gastric abnormality. Normal duodenal course and caliber. --Small bowel: Unremarkable. --Colon: Unremarkable. --Appendix: Normal. Vascular/Lymphatic: Atherosclerotic calcification is present within the non-aneurysmal abdominal aorta, without hemodynamically significant stenosis. --No retroperitoneal lymphadenopathy. --No mesenteric lymphadenopathy. --No pelvic or inguinal lymphadenopathy. Reproductive: Status post hysterectomy. No adnexal mass. Other: No ascites or free air. The abdominal wall is normal. Musculoskeletal. Compression fractures are again noted of the lumbar spine, not substantially changed from prior study. Review of the MIP images confirms the above findings. IMPRESSION: 1. No acute abnormality. 2. Nonobstructing right nephrolithiasis. 3. Distended urinary bladder. Aortic Atherosclerosis (ICD10-I70.0). Electronically Signed   By: Constance Holster M.D.   On: 11/30/2020 21:56   CT ABDOMEN PELVIS W CONTRAST  Result Date: 11/30/2020 CLINICAL DATA:  Pain and fever. EXAM: CT ANGIOGRAPHY CHEST CT ABDOMEN AND PELVIS WITH CONTRAST TECHNIQUE: Multidetector CT imaging of the chest was performed using the standard protocol during bolus administration of intravenous contrast. Multiplanar CT image reconstructions and MIPs were obtained to evaluate the vascular anatomy. Multidetector CT imaging of the abdomen and pelvis was performed using the standard protocol during bolus administration of intravenous contrast. CONTRAST:  150m  OMNIPAQUE IOHEXOL 350 MG/ML SOLN COMPARISON:  CT dated November 30, 2020 FINDINGS: CTA CHEST FINDINGS Cardiovascular: Contrast injection is sufficient to demonstrate satisfactory opacification of the pulmonary arteries to the segmental level. There is no pulmonary embolus or evidence of right heart strain. The size of the main pulmonary artery is normal. Heart size is normal, with no pericardial effusion. The course and caliber of the aorta are normal. There is mild atherosclerotic calcification. Opacification decreased due to pulmonary arterial phase contrast bolus timing. Mediastinum/Nodes: -- No mediastinal lymphadenopathy. -- No hilar lymphadenopathy. -- No axillary lymphadenopathy. -- No supraclavicular lymphadenopathy. -- Normal thyroid gland where visualized. -  Unremarkable esophagus. Lungs/Pleura: Airways are patent. No pleural effusion, lobar consolidation, pneumothorax or pulmonary infarction. Musculoskeletal: There are old left-sided rib fractures. No acute displaced fracture. Review of the MIP images confirms the above findings. CT ABDOMEN and PELVIS FINDINGS Hepatobiliary: The liver is normal. Normal gallbladder.There is no biliary ductal dilation. Pancreas: Normal contours without ductal dilatation. No peripancreatic fluid collection. Spleen: Unremarkable. Adrenals/Urinary Tract: --Adrenal glands: Unremarkable. --Right kidney/ureter: There is a nonobstructing 4 mm stone in the interpolar region of the right kidney. --Left kidney/ureter: No hydronephrosis or radiopaque kidney stones. --Urinary bladder: The urinary bladder is distended. Stomach/Bowel: --Stomach/Duodenum: No hiatal hernia or other gastric abnormality. Normal duodenal course and caliber. --Small bowel: Unremarkable. --Colon: Unremarkable. --Appendix: Normal. Vascular/Lymphatic: Atherosclerotic calcification is present within the non-aneurysmal abdominal aorta, without hemodynamically significant stenosis. --No retroperitoneal  lymphadenopathy. --No mesenteric lymphadenopathy. --No pelvic or inguinal lymphadenopathy. Reproductive: Status post hysterectomy. No adnexal mass. Other: No ascites or free air. The abdominal wall is normal. Musculoskeletal. Compression fractures are again noted of the lumbar spine, not substantially changed from prior study. Review of the MIP images  confirms the above findings. IMPRESSION: 1. No acute abnormality. 2. Nonobstructing right nephrolithiasis. 3. Distended urinary bladder. Aortic Atherosclerosis (ICD10-I70.0). Electronically Signed   By: Constance Holster M.D.   On: 11/30/2020 21:56   DG Chest Port 1 View  Result Date: 11/30/2020 CLINICAL DATA:  Fever, hypokalemia, altered mental status EXAM: PORTABLE CHEST 1 VIEW COMPARISON:  CT 02/09/2012, radiograph 02/09/2012 FINDINGS: Low lung volumes with some hazy opacities atelectasis or pulmonary edema given the presence of pulmonary vascular congestion and fissural thickening. Prominence of the cardiomediastinal silhouette may be accentuated by the portable technique and/or low volumes. The aorta is calcified. The remaining cardiomediastinal contours are unremarkable. No acute osseous or soft tissue abnormality. Degenerative changes are present in the imaged spine and shoulders. IMPRESSION: Low lung volumes with hazy opacities likely reflect atelectasis or early edema given pulmonary vascular congestion. Underlying infection difficult to exclude in the setting of the fever. Electronically Signed   By: Lovena Le M.D.   On: 11/30/2020 19:22   CT Renal Stone Study  Result Date: 11/30/2020 CLINICAL DATA:  Flank pain EXAM: CT ABDOMEN AND PELVIS WITHOUT CONTRAST TECHNIQUE: Multidetector CT imaging of the abdomen and pelvis was performed following the standard protocol without IV contrast. COMPARISON:  CT abdomen dated 01/17/2019 FINDINGS: Lower chest: No acute abnormality. Hepatobiliary: No focal liver abnormality is seen. Gallbladder is unremarkable. No  bile duct dilatation. Pancreas: Unremarkable. No pancreatic ductal dilatation or surrounding inflammatory changes. Spleen: Normal in size without focal abnormality. Adrenals/Urinary Tract: Adrenal glands are unremarkable. 4 mm RIGHT renal stone. Kidneys otherwise unremarkable without hydronephrosis or perinephric inflammation. No ureteral or bladder calculi. Bladder is unremarkable. Stomach/Bowel: No dilated large or small bowel loops. No evidence of bowel wall inflammation. Appendix is not seen but there are no inflammatory changes about the cecum to suggest acute appendicitis. Stomach is unremarkable, partially decompressed. Vascular/Lymphatic: Aortic atherosclerosis. No enlarged lymph nodes are seen within the abdomen or pelvis. Reproductive: Presumed hysterectomy.  No adnexal mass or free fluid. Other: No free fluid or abscess collection. No free intraperitoneal air. Musculoskeletal: No acute-appearing osseous abnormality. Stable chronic compression fracture deformities of the L1, L3 and L5 vertebral bodies. IMPRESSION: 1. No acute findings within the abdomen or pelvis. No bowel obstruction or evidence of bowel wall inflammation. No evidence of acute solid organ abnormality. No free fluid or inflammatory change. 2. 4 mm nonobstructing RIGHT renal stone. No ureteral or bladder calculi. 3. Chronic compression fracture deformities of the L1, L3 and L5 vertebral bodies, not significantly changed compared to earlier CT of 01/17/2019. Aortic Atherosclerosis (ICD10-I70.0). Electronically Signed   By: Franki Cabot M.D.   On: 11/30/2020 12:42     Labs:   Basic Metabolic Panel: Recent Labs  Lab 11/30/20 0951 11/30/20 1925 11/30/20 2225 12/01/20 0255 12/02/20 0540 12/03/20 0550 12/05/20 0510  NA 134* 139  --  137 141 138 140  K 2.8* 3.3*  --  3.4* 4.6 3.9 3.7  CL 100 102  --  107 112* 103 97*  CO2 21* 22  --  21* 18* 22 27  GLUCOSE 149* 107*  --  108* 120* 119* 101*  BUN 14 14  --  '13 16 12 16   ' CREATININE 0.73 0.71 0.62 0.74 0.70 0.44 0.51  CALCIUM 8.7* 9.5  --  8.3* 8.9 8.9 9.3  MG 1.7  --   --   --  2.3 2.0 2.2  PHOS  --   --   --   --  2.3* 2.9  --  GFR Estimated Creatinine Clearance: 65.5 mL/min (by C-G formula based on SCr of 0.51 mg/dL). Liver Function Tests: Recent Labs  Lab 11/30/20 0951 12/01/20 0255  AST 35 31  ALT 22 25  ALKPHOS 55 55  BILITOT 0.9 0.7  PROT 6.5 6.3*  ALBUMIN 3.7 3.3*   No results for input(s): LIPASE, AMYLASE in the last 168 hours. No results for input(s): AMMONIA in the last 168 hours. Coagulation profile Recent Labs  Lab 11/30/20 1925 11/30/20 2225  INR 1.1 1.3*    CBC: Recent Labs  Lab 11/30/20 0951 11/30/20 1925 11/30/20 2225 12/01/20 0255 12/02/20 0540 12/03/20 0550 12/04/20 0506 12/05/20 0510  WBC 12.3* 10.6*   < > 9.1 8.3 6.4 7.2 9.5  NEUTROABS 10.9* 9.8*  --  8.2*  --   --   --   --   HGB 10.5* 12.6   < > 10.6* 11.1* 11.1* 11.1* 11.8*  HCT 31.3* 38.2   < > 32.7* 35.7* 33.9* 33.6* 36.0  MCV 97.8 99.7   < > 100.9* 104.1* 98.0 98.8 97.6  PLT 171 188   < > 162 145* 186 196 221   < > = values in this interval not displayed.   Cardiac Enzymes: No results for input(s): CKTOTAL, CKMB, CKMBINDEX, TROPONINI in the last 168 hours. BNP: Invalid input(s): POCBNP CBG: Recent Labs  Lab 12/01/20 1800 12/01/20 1855  GLUCAP 60* 90   D-Dimer No results for input(s): DDIMER in the last 72 hours. Hgb A1c No results for input(s): HGBA1C in the last 72 hours. Lipid Profile No results for input(s): CHOL, HDL, LDLCALC, TRIG, CHOLHDL, LDLDIRECT in the last 72 hours. Thyroid function studies No results for input(s): TSH, T4TOTAL, T3FREE, THYROIDAB in the last 72 hours.  Invalid input(s): FREET3 Anemia work up Recent Labs    12/03/20 0550  VITAMINB12 3,034*  FOLATE 37.8   Microbiology Recent Results (from the past 240 hour(s))  Urine culture     Status: None   Collection Time: 11/30/20  1:44 PM   Specimen: Urine,  Random  Result Value Ref Range Status   Specimen Description   Final    URINE, RANDOM Performed at [facility] [address], [place], [place] [address]    Special Requests   Final    NONE Performed at [facility], [place] [address], [place], [place] [address]    Culture   Final    NO GROWTH Performed at [facility], [place] [address] [place], [place] [address]    Report Status 12/01/2020 FINAL  Final  Culture, blood (routine x 2)     Status: Abnormal   Collection Time: 11/30/20  7:25 PM   Specimen: BLOOD  Result Value Ref Range Status   Specimen Description   Final    BLOOD BLOOD LEFT FOREARM Performed at [facility] [address], [place], [place] [address]    Special Requests   Final    BOTTLES DRAWN AEROBIC AND ANAEROBIC Blood Culture results may not be optimal due to an inadequate volume of blood received in culture bottles   Culture  Setup Time   Final    GRAM POSITIVE COCCI IN CLUSTERS IN BOTH AEROBIC AND ANAEROBIC BOTTLES CRITICAL RESULT CALLED TO, READ BACK BY AND VERIFIED WITH: Melodye Ped PharmD 13:20 12/01/20 ([email or web address]) Performed at [facility], [place] [address] [place], [place] [address]    Culture STAPHYLOCOCCUS AUREUS (A)  Final   Report Status 12/03/2020 FINAL  Final   Organism ID, Bacteria STAPHYLOCOCCUS AUREUS  Final      Susceptibility   Staphylococcus aureus - MIC*    CIPROFLOXACIN <=0.5 SENSITIVE Sensitive     ERYTHROMYCIN <=0.25 SENSITIVE Sensitive     GENTAMICIN <=0.5 SENSITIVE Sensitive     OXACILLIN <=0.25 SENSITIVE Sensitive     TETRACYCLINE <=1 SENSITIVE Sensitive     VANCOMYCIN 1 SENSITIVE Sensitive     TRIMETH/SULFA <=10 SENSITIVE Sensitive     CLINDAMYCIN <=0.25 SENSITIVE Sensitive     RIFAMPIN <=0.5 SENSITIVE Sensitive     Inducible Clindamycin NEGATIVE Sensitive     * STAPHYLOCOCCUS AUREUS  SARS Coronavirus 2 by RT PCR (hospital order, performed in [place] hospital  lab) Nasopharyngeal Nasopharyngeal Swab     Status: None   Collection Time: 11/30/20  7:25 PM   Specimen: Nasopharyngeal Swab  Result Value Ref Range Status   SARS Coronavirus 2 NEGATIVE NEGATIVE Final    Comment: (NOTE) SARS-CoV-2 target nucleic acids are NOT DETECTED.  The SARS-CoV-2 RNA is generally detectable in upper and lower respiratory specimens during the acute phase of infection. The lowest concentration of SARS-CoV-2 viral copies this assay can detect is 250 copies / mL. A negative result does not preclude SARS-CoV-2 infection and should not be used as the sole basis for treatment or other patient management decisions.  A negative result may occur with improper specimen collection / handling, submission of specimen other than nasopharyngeal swab, presence of viral mutation(s) within the areas targeted by this assay, and inadequate number of viral copies (<250 copies / mL). A negative result must be combined with clinical observations, patient history, and epidemiological information.  Fact Sheet for Patients:   StrictlyIdeas.no  Fact Sheet for Healthcare Providers: BankingDealers.co.za  This test is not yet approved or  cleared by the [place] FDA and has been authorized for detection and/or diagnosis of SARS-CoV-2 by FDA under an Emergency Use Authorization (EUA).  This EUA will remain in effect (meaning this test can be used) for the duration of the COVID-19 declaration under Section 564(b)(1) of the Act, 21 U.S.C. section 360bbb-3(b)(1), unless the authorization is terminated or revoked sooner.  Performed at [facility], [place] [address], [place], [place] [address]   Blood Culture ID Panel (Reflexed)     Status: Abnormal   Collection Time: 11/30/20  7:25 PM  Result Value Ref Range Status   Enterococcus faecalis NOT DETECTED NOT DETECTED Final   Enterococcus Faecium NOT DETECTED NOT DETECTED  Final   Listeria monocytogenes NOT DETECTED NOT DETECTED Final   Staphylococcus species DETECTED (A) NOT DETECTED Final    Comment: CRITICAL RESULT CALLED TO, READ BACK BY AND VERIFIED WITH: Melodye Ped PharmD 13:20 12/01/20 ([email or web address])    Staphylococcus aureus (BCID) DETECTED (A) NOT DETECTED Final    Comment: CRITICAL RESULT CALLED TO, READ BACK BY AND VERIFIED WITH: Melodye Ped PharmD 13:20 12/01/20 ([email or web address])    Staphylococcus epidermidis NOT DETECTED NOT DETECTED Final   Staphylococcus lugdunensis NOT DETECTED NOT DETECTED Final   Streptococcus species NOT DETECTED NOT DETECTED Final   Streptococcus agalactiae NOT DETECTED NOT DETECTED Final   Streptococcus pneumoniae NOT DETECTED NOT DETECTED Final   Streptococcus pyogenes NOT DETECTED NOT DETECTED Final   A.calcoaceticus-baumannii NOT DETECTED NOT DETECTED Final   Bacteroides fragilis NOT DETECTED NOT DETECTED Final   Enterobacterales NOT DETECTED NOT DETECTED Final   Enterobacter cloacae complex NOT DETECTED NOT DETECTED Final   Escherichia coli NOT DETECTED NOT DETECTED Final  Klebsiella aerogenes NOT DETECTED NOT DETECTED Final   Klebsiella oxytoca NOT DETECTED NOT DETECTED Final   Klebsiella pneumoniae NOT DETECTED NOT DETECTED Final   Proteus species NOT DETECTED NOT DETECTED Final   Salmonella species NOT DETECTED NOT DETECTED Final   Serratia marcescens NOT DETECTED NOT DETECTED Final   Haemophilus influenzae NOT DETECTED NOT DETECTED Final   Neisseria meningitidis NOT DETECTED NOT DETECTED Final   Pseudomonas aeruginosa NOT DETECTED NOT DETECTED Final   Stenotrophomonas maltophilia NOT DETECTED NOT DETECTED Final   Candida albicans NOT DETECTED NOT DETECTED Final   Candida auris NOT DETECTED NOT DETECTED Final   Candida glabrata NOT DETECTED NOT DETECTED Final   Candida krusei NOT DETECTED NOT DETECTED Final   Candida parapsilosis NOT DETECTED NOT DETECTED Final   Candida tropicalis NOT DETECTED NOT DETECTED Final    Cryptococcus neoformans/gattii NOT DETECTED NOT DETECTED Final   Meth resistant mecA/C and MREJ NOT DETECTED NOT DETECTED Final    Comment: Performed at [facility], [place] [address] [place], [place] [address]  Culture, blood (Routine X 2) w Reflex to ID Panel     Status: Abnormal   Collection Time: 11/30/20  8:06 PM   Specimen: BLOOD  Result Value Ref Range Status   Specimen Description   Final    BLOOD BLOOD LEFT HAND Performed at [facility] [address], [place], [place] [address]    Special Requests   Final    BOTTLES DRAWN AEROBIC AND ANAEROBIC Blood Culture results may not be optimal due to an inadequate volume of blood received in culture bottles Performed at [facility] [address], [place], [place] [address]    Culture  Setup Time   Final    GRAM POSITIVE COCCI IN CLUSTERS IN BOTH AEROBIC AND ANAEROBIC BOTTLES CRITICAL VALUE NOTED.  VALUE IS CONSISTENT WITH PREVIOUSLY REPORTED AND CALLED VALUE.    Culture (A)  Final    STAPHYLOCOCCUS AUREUS SUSCEPTIBILITIES PERFORMED ON PREVIOUS CULTURE WITHIN THE LAST 5 DAYS. Performed at [facility], [place] [address] [place], [place] [address]    Report Status 12/03/2020 FINAL  Final  Culture, blood (Routine X 2) w Reflex to ID Panel     Status: None (Preliminary result)   Collection Time: 12/02/20  5:40 AM   Specimen: BLOOD RIGHT HAND  Result Value Ref Range Status   Specimen Description   Final    BLOOD RIGHT HAND Performed at [facility] [address], [place], [place] [address]    Special Requests   Final    BOTTLES DRAWN AEROBIC ONLY Blood Culture results may not be optimal due to an inadequate volume of blood received in culture bottles Performed at [facility] [address] [place], [place] [address]    Culture   Final    NO GROWTH 3 DAYS Performed at [facility], [place] [address], [place], [place] [address]     Report Status PENDING  Incomplete     Discharge Instructions:   Discharge Instructions    [facility] pharmacist to adjust dose for Vancomycin, Aminoglycosides and other anti-infective therapies as requested by physician.   Complete by: As directed    [facility] to provide Cath Flo 54m   Complete by: As directed    Administer for PICC line occlusion and as ordered by physician for other access device issues.   Anaphylaxis Kit: Provided to treat any anaphylactic reaction  to the medication being provided to the patient if First Dose or when requested by physician   Complete by: As directed    Epinephrine 15m/ml vial / amp: Administer 0.375m(0.59m16msubcutaneously once for moderate to severe anaphylaxis, nurse to call physician and pharmacy when reaction occurs and call 911 if needed for immediate care   Diphenhydramine 76m26m IV vial: Administer 25-76mg34mIM PRN for first dose reaction, rash, itching, mild reaction, nurse to call physician and pharmacy when reaction occurs   Sodium Chloride 0.9% NS 500ml 359mAdminister if needed for hypovolemic blood pressure drop or as ordered by physician after call to physician with anaphylactic reaction   Call MD for:  persistant nausea and vomiting   Complete by: As directed    Call MD for:  temperature >100.4   Complete by: As directed    Change dressing on IV access line weekly and PRN   Complete by: As directed    Diet general   Complete by: As directed    Discharge instructions   Complete by: As directed    Follow-up with your primary care physician at the skilled nursing facility in 3 to 5 days.  Continue to take antibiotics as prescribed via PICC line.  Check blood work as instructed.   Flush IV access with Sodium Chloride 0.9% and Heparin 10 units/ml or 100 units/ml   Complete by: As directed    Home infusion instructions - [facility]   Complete by: As directed    Instructions: Flush IV access with Sodium  Chloride 0.9% and Heparin 10units/ml or 100units/ml   Change dressing on IV access line: Weekly and PRN   Instructions Cath Flo 2mg: A41mnister for PICC Line occlusion and as ordered by physician for other access device   [facility] pharmacist to adjust dose for: Vancomycin, Aminoglycosides and other anti-infective therapies as requested by physician   Increase activity slowly   Complete by: As directed    Method of administration may be changed at the discretion of home infusion pharmacist based upon assessment of the patient and/or caregiver's ability to self-administer the medication ordered   Complete by: As directed    No wound care   Complete by: As directed      Allergies as of 12/05/2020   No Known Allergies     Medication List    STOP taking these medications   diclofenac 75 MG EC tablet Commonly known as: VOLTAREN   Garlic 1000 MG2725s   traMADol 50 MG tablet Commonly known as: ULTRAM     TAKE these medications   acetaminophen 650 MG CR tablet Commonly known as: TYLENOL Take 650 mg by mouth every 8 (eight) hours as needed for pain.   amLODipine 5 MG tablet Commonly known as: NORVASC Take 1 tablet (5 mg total) by mouth daily. Start taking on: December 06, 2020   ARIPiprazole 10 MG tablet Commonly known as: ABILIFY Take 10 mg by mouth daily.   CALTRATE 600+D3 SOFT PO Take 1 tablet by mouth daily.   ceFAZolin  IVPB Commonly known as: ANCEF Inject 2 g into the vein every 8 (eight) hours for 10 days. Indication:  MSSA Bacteremia First Dose: No Last Day of Therapy:  12/14/2020 Labs - Once weekly:  CBC/D and BMP, Labs - Every other week:  ESR and CRP Method of administration: IV Push Method of administration may be changed at the discretion of home infusion pharmacist based upon assessment of the patient and/or caregiver's ability  to self-administer the medication ordered.   cholecalciferol 1000 units tablet Commonly known as: VITAMIN D Take 1,000  Units by mouth daily.   cyclobenzaprine 10 MG tablet Commonly known as: FLEXERIL Take 10 mg by mouth 2 (two) times daily as needed for muscle spasms.   DULoxetine 60 MG capsule Commonly known as: CYMBALTA Take 120 mg by mouth daily.   Fish Oil 1000 MG Caps Take by mouth.   folic acid 696 MCG tablet Commonly known as: FOLVITE Take 400 mcg by mouth daily.   gabapentin 600 MG tablet Commonly known as: NEURONTIN Take 600 mg by mouth 2 (two) times daily.   levothyroxine 25 MCG tablet Commonly known as: SYNTHROID Take 25 mcg by mouth daily.   multivitamin tablet Take 1 tablet by mouth daily.   naproxen sodium 220 MG tablet Commonly known as: ALEVE Take 220 mg by mouth daily as needed (pain).   polyethylene glycol 17 g packet Commonly known as: MIRALAX / GLYCOLAX Take 17 g by mouth daily. What changed:   when to take this  reasons to take this   pravastatin 40 MG tablet Commonly known as: PRAVACHOL Take 40 mg by mouth daily with breakfast.   traZODone 100 MG tablet Commonly known as: DESYREL Take 100 mg by mouth at bedtime as needed for sleep.   vitamin B-12 500 MCG tablet Commonly known as: CYANOCOBALAMIN Take 500 mcg by mouth daily.   vitamin C 500 MG tablet Commonly known as: ASCORBIC ACID Take 500 mg by mouth daily.            Discharge Care Instructions  (From admission, onward)         Start     Ordered   12/04/20 0000  Change dressing on IV access line weekly and PRN  (Home infusion instructions - [facility] )        12/04/20 1611          Contact information for after-discharge care    Destination    [facility] Preferred SNF .   Service: Skilled Nursing Contact information: [address] [place] [phone]                   Time coordinating discharge: 39 minutes  Signed:  Ember Gottwald  [facility] 12/05/2020, 12:47 PM

## 2020-12-06 DIAGNOSIS — R6510 Systemic inflammatory response syndrome (SIRS) of non-infectious origin without acute organ dysfunction: Secondary | ICD-10-CM | POA: Diagnosis not present

## 2020-12-06 DIAGNOSIS — D638 Anemia in other chronic diseases classified elsewhere: Secondary | ICD-10-CM | POA: Diagnosis not present

## 2020-12-06 DIAGNOSIS — M2550 Pain in unspecified joint: Secondary | ICD-10-CM | POA: Diagnosis not present

## 2020-12-06 DIAGNOSIS — M5416 Radiculopathy, lumbar region: Secondary | ICD-10-CM | POA: Diagnosis not present

## 2020-12-06 DIAGNOSIS — F29 Unspecified psychosis not due to a substance or known physiological condition: Secondary | ICD-10-CM | POA: Diagnosis not present

## 2020-12-06 DIAGNOSIS — Z7409 Other reduced mobility: Secondary | ICD-10-CM | POA: Diagnosis not present

## 2020-12-06 DIAGNOSIS — G629 Polyneuropathy, unspecified: Secondary | ICD-10-CM | POA: Diagnosis not present

## 2020-12-06 DIAGNOSIS — I10 Essential (primary) hypertension: Secondary | ICD-10-CM | POA: Diagnosis not present

## 2020-12-06 DIAGNOSIS — U071 COVID-19: Secondary | ICD-10-CM | POA: Diagnosis not present

## 2020-12-06 DIAGNOSIS — R7881 Bacteremia: Secondary | ICD-10-CM | POA: Diagnosis not present

## 2020-12-06 DIAGNOSIS — R531 Weakness: Secondary | ICD-10-CM | POA: Diagnosis not present

## 2020-12-06 DIAGNOSIS — G9009 Other idiopathic peripheral autonomic neuropathy: Secondary | ICD-10-CM | POA: Diagnosis not present

## 2020-12-06 DIAGNOSIS — Z741 Need for assistance with personal care: Secondary | ICD-10-CM | POA: Diagnosis not present

## 2020-12-06 DIAGNOSIS — E785 Hyperlipidemia, unspecified: Secondary | ICD-10-CM | POA: Diagnosis not present

## 2020-12-06 DIAGNOSIS — M6281 Muscle weakness (generalized): Secondary | ICD-10-CM | POA: Diagnosis not present

## 2020-12-06 DIAGNOSIS — Z7401 Bed confinement status: Secondary | ICD-10-CM | POA: Diagnosis not present

## 2020-12-06 DIAGNOSIS — D649 Anemia, unspecified: Secondary | ICD-10-CM | POA: Diagnosis not present

## 2020-12-06 DIAGNOSIS — F32A Depression, unspecified: Secondary | ICD-10-CM | POA: Diagnosis not present

## 2020-12-06 DIAGNOSIS — A4101 Sepsis due to Methicillin susceptible Staphylococcus aureus: Secondary | ICD-10-CM | POA: Diagnosis not present

## 2020-12-06 DIAGNOSIS — C211 Malignant neoplasm of anal canal: Secondary | ICD-10-CM | POA: Diagnosis not present

## 2020-12-06 DIAGNOSIS — E039 Hypothyroidism, unspecified: Secondary | ICD-10-CM | POA: Diagnosis not present

## 2020-12-06 NOTE — Progress Notes (Signed)
Report called to [facility], transportation called. Patients daughter, April notified of patients transfer to [facility]. All patient belongings sent with patient.

## 2020-12-07 LAB — CULTURE, BLOOD (ROUTINE X 2): Culture: NO GROWTH

## 2020-12-12 DIAGNOSIS — D638 Anemia in other chronic diseases classified elsewhere: Secondary | ICD-10-CM | POA: Diagnosis not present

## 2020-12-12 DIAGNOSIS — R7881 Bacteremia: Secondary | ICD-10-CM | POA: Diagnosis not present

## 2020-12-12 DIAGNOSIS — G629 Polyneuropathy, unspecified: Secondary | ICD-10-CM | POA: Diagnosis not present

## 2020-12-12 DIAGNOSIS — E785 Hyperlipidemia, unspecified: Secondary | ICD-10-CM | POA: Diagnosis not present

## 2020-12-15 DIAGNOSIS — G9009 Other idiopathic peripheral autonomic neuropathy: Secondary | ICD-10-CM | POA: Diagnosis not present

## 2020-12-15 DIAGNOSIS — A4101 Sepsis due to Methicillin susceptible Staphylococcus aureus: Secondary | ICD-10-CM | POA: Diagnosis not present

## 2020-12-15 DIAGNOSIS — U071 COVID-19: Secondary | ICD-10-CM | POA: Diagnosis not present

## 2020-12-15 DIAGNOSIS — D649 Anemia, unspecified: Secondary | ICD-10-CM | POA: Diagnosis not present

## 2020-12-15 DIAGNOSIS — R531 Weakness: Secondary | ICD-10-CM | POA: Diagnosis not present

## 2020-12-16 DIAGNOSIS — G9009 Other idiopathic peripheral autonomic neuropathy: Secondary | ICD-10-CM | POA: Diagnosis not present

## 2020-12-16 DIAGNOSIS — D649 Anemia, unspecified: Secondary | ICD-10-CM | POA: Diagnosis not present

## 2020-12-16 DIAGNOSIS — R531 Weakness: Secondary | ICD-10-CM | POA: Diagnosis not present

## 2020-12-16 DIAGNOSIS — U071 COVID-19: Secondary | ICD-10-CM | POA: Diagnosis not present

## 2020-12-16 DIAGNOSIS — A4101 Sepsis due to Methicillin susceptible Staphylococcus aureus: Secondary | ICD-10-CM | POA: Diagnosis not present

## 2020-12-17 DIAGNOSIS — D649 Anemia, unspecified: Secondary | ICD-10-CM | POA: Diagnosis not present

## 2020-12-17 DIAGNOSIS — A4101 Sepsis due to Methicillin susceptible Staphylococcus aureus: Secondary | ICD-10-CM | POA: Diagnosis not present

## 2020-12-17 DIAGNOSIS — M5416 Radiculopathy, lumbar region: Secondary | ICD-10-CM | POA: Diagnosis not present

## 2020-12-17 DIAGNOSIS — U071 COVID-19: Secondary | ICD-10-CM | POA: Diagnosis not present

## 2020-12-17 DIAGNOSIS — Z741 Need for assistance with personal care: Secondary | ICD-10-CM | POA: Diagnosis not present

## 2020-12-17 DIAGNOSIS — G9009 Other idiopathic peripheral autonomic neuropathy: Secondary | ICD-10-CM | POA: Diagnosis not present

## 2020-12-17 DIAGNOSIS — R531 Weakness: Secondary | ICD-10-CM | POA: Diagnosis not present

## 2020-12-17 DIAGNOSIS — E039 Hypothyroidism, unspecified: Secondary | ICD-10-CM | POA: Diagnosis not present

## 2020-12-21 DIAGNOSIS — M6281 Muscle weakness (generalized): Secondary | ICD-10-CM | POA: Diagnosis not present

## 2020-12-21 DIAGNOSIS — Z7409 Other reduced mobility: Secondary | ICD-10-CM | POA: Diagnosis not present

## 2020-12-23 ENCOUNTER — Other Ambulatory Visit: Payer: Self-pay

## 2020-12-23 DIAGNOSIS — N390 Urinary tract infection, site not specified: Secondary | ICD-10-CM | POA: Diagnosis not present

## 2020-12-23 DIAGNOSIS — I10 Essential (primary) hypertension: Secondary | ICD-10-CM | POA: Diagnosis not present

## 2020-12-23 DIAGNOSIS — E785 Hyperlipidemia, unspecified: Secondary | ICD-10-CM | POA: Diagnosis not present

## 2020-12-23 DIAGNOSIS — G9341 Metabolic encephalopathy: Secondary | ICD-10-CM | POA: Diagnosis not present

## 2020-12-23 DIAGNOSIS — M5416 Radiculopathy, lumbar region: Secondary | ICD-10-CM | POA: Diagnosis not present

## 2020-12-23 DIAGNOSIS — A4101 Sepsis due to Methicillin susceptible Staphylococcus aureus: Secondary | ICD-10-CM | POA: Diagnosis not present

## 2020-12-23 DIAGNOSIS — D649 Anemia, unspecified: Secondary | ICD-10-CM | POA: Diagnosis not present

## 2020-12-23 DIAGNOSIS — F32A Depression, unspecified: Secondary | ICD-10-CM | POA: Diagnosis not present

## 2020-12-23 DIAGNOSIS — G629 Polyneuropathy, unspecified: Secondary | ICD-10-CM | POA: Diagnosis not present

## 2020-12-23 NOTE — Patient Outreach (Signed)
[facility]) Care Management  12/23/2020  [name] [birth year] [id number]     Transition of Care Referral  Referral Date: 12/23/2020 Referral Source: [facility] Discharge Report Date of Discharge: 12/20/2020 Facility: [facility] Insurance: [facility]    Referral received. Transition of care calls being completed via EMMI-automated calls. RN CM will outreach patient for any red flags received.     Plan: RN CM will close case at this time.   Enzo Montgomery, RN,BSN,CCM [facility] Telephonic Care Management Coordinator Direct Phone: [phone] Toll Free: [phone] Fax: [phone]

## 2020-12-25 DIAGNOSIS — M5416 Radiculopathy, lumbar region: Secondary | ICD-10-CM | POA: Diagnosis not present

## 2020-12-25 DIAGNOSIS — I10 Essential (primary) hypertension: Secondary | ICD-10-CM | POA: Diagnosis not present

## 2020-12-25 DIAGNOSIS — G629 Polyneuropathy, unspecified: Secondary | ICD-10-CM | POA: Diagnosis not present

## 2020-12-25 DIAGNOSIS — D649 Anemia, unspecified: Secondary | ICD-10-CM | POA: Diagnosis not present

## 2020-12-25 DIAGNOSIS — N390 Urinary tract infection, site not specified: Secondary | ICD-10-CM | POA: Diagnosis not present

## 2020-12-25 DIAGNOSIS — E785 Hyperlipidemia, unspecified: Secondary | ICD-10-CM | POA: Diagnosis not present

## 2020-12-25 DIAGNOSIS — F32A Depression, unspecified: Secondary | ICD-10-CM | POA: Diagnosis not present

## 2020-12-25 DIAGNOSIS — G9341 Metabolic encephalopathy: Secondary | ICD-10-CM | POA: Diagnosis not present

## 2020-12-25 DIAGNOSIS — A4101 Sepsis due to Methicillin susceptible Staphylococcus aureus: Secondary | ICD-10-CM | POA: Diagnosis not present

## 2020-12-29 ENCOUNTER — Telehealth: Payer: Self-pay

## 2020-12-29 DIAGNOSIS — M5416 Radiculopathy, lumbar region: Secondary | ICD-10-CM | POA: Diagnosis not present

## 2020-12-29 DIAGNOSIS — G9341 Metabolic encephalopathy: Secondary | ICD-10-CM | POA: Diagnosis not present

## 2020-12-29 DIAGNOSIS — A4101 Sepsis due to Methicillin susceptible Staphylococcus aureus: Secondary | ICD-10-CM | POA: Diagnosis not present

## 2020-12-29 DIAGNOSIS — I10 Essential (primary) hypertension: Secondary | ICD-10-CM | POA: Diagnosis not present

## 2020-12-29 DIAGNOSIS — F32A Depression, unspecified: Secondary | ICD-10-CM | POA: Diagnosis not present

## 2020-12-29 DIAGNOSIS — N390 Urinary tract infection, site not specified: Secondary | ICD-10-CM | POA: Diagnosis not present

## 2020-12-29 DIAGNOSIS — G629 Polyneuropathy, unspecified: Secondary | ICD-10-CM | POA: Diagnosis not present

## 2020-12-29 DIAGNOSIS — D649 Anemia, unspecified: Secondary | ICD-10-CM | POA: Diagnosis not present

## 2020-12-29 DIAGNOSIS — E785 Hyperlipidemia, unspecified: Secondary | ICD-10-CM | POA: Diagnosis not present

## 2020-12-29 NOTE — Telephone Encounter (Signed)
Referral placed.

## 2020-12-29 NOTE — Telephone Encounter (Signed)
Looks like she does want to see Dr. Louanne Skye now, please make another referral.

## 2020-12-29 NOTE — Telephone Encounter (Signed)
Please advise 

## 2020-12-29 NOTE — Addendum Note (Signed)
Addended by: Sherre Scarlet B on: 12/29/2020 12:31 PM   Modules accepted: Orders

## 2020-12-29 NOTE — Telephone Encounter (Signed)
Patient called she is requesting another referral to be sent to Dr.Nitka due to pain management not helping her back call back: [phone]

## 2020-12-30 DIAGNOSIS — N390 Urinary tract infection, site not specified: Secondary | ICD-10-CM | POA: Diagnosis not present

## 2020-12-30 DIAGNOSIS — A4101 Sepsis due to Methicillin susceptible Staphylococcus aureus: Secondary | ICD-10-CM | POA: Diagnosis not present

## 2020-12-30 DIAGNOSIS — D649 Anemia, unspecified: Secondary | ICD-10-CM | POA: Diagnosis not present

## 2020-12-30 DIAGNOSIS — F32A Depression, unspecified: Secondary | ICD-10-CM | POA: Diagnosis not present

## 2020-12-30 DIAGNOSIS — M5416 Radiculopathy, lumbar region: Secondary | ICD-10-CM | POA: Diagnosis not present

## 2020-12-30 DIAGNOSIS — G9341 Metabolic encephalopathy: Secondary | ICD-10-CM | POA: Diagnosis not present

## 2020-12-30 DIAGNOSIS — I10 Essential (primary) hypertension: Secondary | ICD-10-CM | POA: Diagnosis not present

## 2020-12-30 DIAGNOSIS — G629 Polyneuropathy, unspecified: Secondary | ICD-10-CM | POA: Diagnosis not present

## 2020-12-30 DIAGNOSIS — E785 Hyperlipidemia, unspecified: Secondary | ICD-10-CM | POA: Diagnosis not present

## 2020-12-31 DIAGNOSIS — M47816 Spondylosis without myelopathy or radiculopathy, lumbar region: Secondary | ICD-10-CM | POA: Diagnosis not present

## 2020-12-31 DIAGNOSIS — E039 Hypothyroidism, unspecified: Secondary | ICD-10-CM | POA: Diagnosis not present

## 2020-12-31 DIAGNOSIS — R7881 Bacteremia: Secondary | ICD-10-CM | POA: Diagnosis not present

## 2020-12-31 DIAGNOSIS — B9561 Methicillin susceptible Staphylococcus aureus infection as the cause of diseases classified elsewhere: Secondary | ICD-10-CM | POA: Diagnosis not present

## 2020-12-31 DIAGNOSIS — R6510 Systemic inflammatory response syndrome (SIRS) of non-infectious origin without acute organ dysfunction: Secondary | ICD-10-CM | POA: Diagnosis not present

## 2021-01-01 DIAGNOSIS — I10 Essential (primary) hypertension: Secondary | ICD-10-CM | POA: Diagnosis not present

## 2021-01-01 DIAGNOSIS — G629 Polyneuropathy, unspecified: Secondary | ICD-10-CM | POA: Diagnosis not present

## 2021-01-01 DIAGNOSIS — D649 Anemia, unspecified: Secondary | ICD-10-CM | POA: Diagnosis not present

## 2021-01-01 DIAGNOSIS — N390 Urinary tract infection, site not specified: Secondary | ICD-10-CM | POA: Diagnosis not present

## 2021-01-01 DIAGNOSIS — A4101 Sepsis due to Methicillin susceptible Staphylococcus aureus: Secondary | ICD-10-CM | POA: Diagnosis not present

## 2021-01-01 DIAGNOSIS — M5416 Radiculopathy, lumbar region: Secondary | ICD-10-CM | POA: Diagnosis not present

## 2021-01-01 DIAGNOSIS — E785 Hyperlipidemia, unspecified: Secondary | ICD-10-CM | POA: Diagnosis not present

## 2021-01-01 DIAGNOSIS — F32A Depression, unspecified: Secondary | ICD-10-CM | POA: Diagnosis not present

## 2021-01-01 DIAGNOSIS — G9341 Metabolic encephalopathy: Secondary | ICD-10-CM | POA: Diagnosis not present

## 2021-01-06 DIAGNOSIS — M5416 Radiculopathy, lumbar region: Secondary | ICD-10-CM | POA: Diagnosis not present

## 2021-01-06 DIAGNOSIS — G9341 Metabolic encephalopathy: Secondary | ICD-10-CM | POA: Diagnosis not present

## 2021-01-06 DIAGNOSIS — G629 Polyneuropathy, unspecified: Secondary | ICD-10-CM | POA: Diagnosis not present

## 2021-01-06 DIAGNOSIS — I10 Essential (primary) hypertension: Secondary | ICD-10-CM | POA: Diagnosis not present

## 2021-01-06 DIAGNOSIS — E785 Hyperlipidemia, unspecified: Secondary | ICD-10-CM | POA: Diagnosis not present

## 2021-01-06 DIAGNOSIS — F32A Depression, unspecified: Secondary | ICD-10-CM | POA: Diagnosis not present

## 2021-01-06 DIAGNOSIS — A4101 Sepsis due to Methicillin susceptible Staphylococcus aureus: Secondary | ICD-10-CM | POA: Diagnosis not present

## 2021-01-06 DIAGNOSIS — D649 Anemia, unspecified: Secondary | ICD-10-CM | POA: Diagnosis not present

## 2021-01-06 DIAGNOSIS — N390 Urinary tract infection, site not specified: Secondary | ICD-10-CM | POA: Diagnosis not present

## 2021-01-08 DIAGNOSIS — N390 Urinary tract infection, site not specified: Secondary | ICD-10-CM | POA: Diagnosis not present

## 2021-01-08 DIAGNOSIS — G9341 Metabolic encephalopathy: Secondary | ICD-10-CM | POA: Diagnosis not present

## 2021-01-08 DIAGNOSIS — A4101 Sepsis due to Methicillin susceptible Staphylococcus aureus: Secondary | ICD-10-CM | POA: Diagnosis not present

## 2021-01-08 DIAGNOSIS — E785 Hyperlipidemia, unspecified: Secondary | ICD-10-CM | POA: Diagnosis not present

## 2021-01-08 DIAGNOSIS — D649 Anemia, unspecified: Secondary | ICD-10-CM | POA: Diagnosis not present

## 2021-01-08 DIAGNOSIS — I10 Essential (primary) hypertension: Secondary | ICD-10-CM | POA: Diagnosis not present

## 2021-01-08 DIAGNOSIS — G629 Polyneuropathy, unspecified: Secondary | ICD-10-CM | POA: Diagnosis not present

## 2021-01-08 DIAGNOSIS — M5416 Radiculopathy, lumbar region: Secondary | ICD-10-CM | POA: Diagnosis not present

## 2021-01-08 DIAGNOSIS — F32A Depression, unspecified: Secondary | ICD-10-CM | POA: Diagnosis not present

## 2021-01-13 DIAGNOSIS — M5416 Radiculopathy, lumbar region: Secondary | ICD-10-CM | POA: Diagnosis not present

## 2021-01-13 DIAGNOSIS — D649 Anemia, unspecified: Secondary | ICD-10-CM | POA: Diagnosis not present

## 2021-01-13 DIAGNOSIS — G629 Polyneuropathy, unspecified: Secondary | ICD-10-CM | POA: Diagnosis not present

## 2021-01-13 DIAGNOSIS — F32A Depression, unspecified: Secondary | ICD-10-CM | POA: Diagnosis not present

## 2021-01-13 DIAGNOSIS — G9341 Metabolic encephalopathy: Secondary | ICD-10-CM | POA: Diagnosis not present

## 2021-01-13 DIAGNOSIS — E785 Hyperlipidemia, unspecified: Secondary | ICD-10-CM | POA: Diagnosis not present

## 2021-01-13 DIAGNOSIS — N390 Urinary tract infection, site not specified: Secondary | ICD-10-CM | POA: Diagnosis not present

## 2021-01-13 DIAGNOSIS — I10 Essential (primary) hypertension: Secondary | ICD-10-CM | POA: Diagnosis not present

## 2021-01-13 DIAGNOSIS — A4101 Sepsis due to Methicillin susceptible Staphylococcus aureus: Secondary | ICD-10-CM | POA: Diagnosis not present

## 2021-01-14 DIAGNOSIS — F331 Major depressive disorder, recurrent, moderate: Secondary | ICD-10-CM | POA: Diagnosis not present

## 2021-01-14 DIAGNOSIS — G629 Polyneuropathy, unspecified: Secondary | ICD-10-CM | POA: Diagnosis not present

## 2021-01-14 DIAGNOSIS — A4101 Sepsis due to Methicillin susceptible Staphylococcus aureus: Secondary | ICD-10-CM | POA: Diagnosis not present

## 2021-01-14 DIAGNOSIS — F32A Depression, unspecified: Secondary | ICD-10-CM | POA: Diagnosis not present

## 2021-01-14 DIAGNOSIS — F411 Generalized anxiety disorder: Secondary | ICD-10-CM | POA: Diagnosis not present

## 2021-01-14 DIAGNOSIS — G9341 Metabolic encephalopathy: Secondary | ICD-10-CM | POA: Diagnosis not present

## 2021-01-14 DIAGNOSIS — E785 Hyperlipidemia, unspecified: Secondary | ICD-10-CM | POA: Diagnosis not present

## 2021-01-14 DIAGNOSIS — N390 Urinary tract infection, site not specified: Secondary | ICD-10-CM | POA: Diagnosis not present

## 2021-01-14 DIAGNOSIS — M5416 Radiculopathy, lumbar region: Secondary | ICD-10-CM | POA: Diagnosis not present

## 2021-01-14 DIAGNOSIS — I10 Essential (primary) hypertension: Secondary | ICD-10-CM | POA: Diagnosis not present

## 2021-01-14 DIAGNOSIS — D649 Anemia, unspecified: Secondary | ICD-10-CM | POA: Diagnosis not present

## 2021-01-22 DIAGNOSIS — F32A Depression, unspecified: Secondary | ICD-10-CM | POA: Diagnosis not present

## 2021-01-22 DIAGNOSIS — I10 Essential (primary) hypertension: Secondary | ICD-10-CM | POA: Diagnosis not present

## 2021-01-22 DIAGNOSIS — M5416 Radiculopathy, lumbar region: Secondary | ICD-10-CM | POA: Diagnosis not present

## 2021-01-22 DIAGNOSIS — N390 Urinary tract infection, site not specified: Secondary | ICD-10-CM | POA: Diagnosis not present

## 2021-01-22 DIAGNOSIS — A4101 Sepsis due to Methicillin susceptible Staphylococcus aureus: Secondary | ICD-10-CM | POA: Diagnosis not present

## 2021-01-22 DIAGNOSIS — D649 Anemia, unspecified: Secondary | ICD-10-CM | POA: Diagnosis not present

## 2021-01-22 DIAGNOSIS — E785 Hyperlipidemia, unspecified: Secondary | ICD-10-CM | POA: Diagnosis not present

## 2021-01-22 DIAGNOSIS — G629 Polyneuropathy, unspecified: Secondary | ICD-10-CM | POA: Diagnosis not present

## 2021-01-22 DIAGNOSIS — G9341 Metabolic encephalopathy: Secondary | ICD-10-CM | POA: Diagnosis not present

## 2021-02-04 ENCOUNTER — Ambulatory Visit (INDEPENDENT_AMBULATORY_CARE_PROVIDER_SITE_OTHER): Payer: Medicare HMO | Admitting: Specialist

## 2021-02-04 ENCOUNTER — Other Ambulatory Visit: Payer: Self-pay

## 2021-02-04 ENCOUNTER — Ambulatory Visit: Payer: Self-pay

## 2021-02-04 ENCOUNTER — Encounter: Payer: Self-pay | Admitting: Specialist

## 2021-02-04 VITALS — BP 128/82 | HR 76 | Ht 65.0 in | Wt 176.0 lb

## 2021-02-04 DIAGNOSIS — M1711 Unilateral primary osteoarthritis, right knee: Secondary | ICD-10-CM | POA: Diagnosis not present

## 2021-02-04 DIAGNOSIS — M48062 Spinal stenosis, lumbar region with neurogenic claudication: Secondary | ICD-10-CM

## 2021-02-04 DIAGNOSIS — S32010A Wedge compression fracture of first lumbar vertebra, initial encounter for closed fracture: Secondary | ICD-10-CM

## 2021-02-04 DIAGNOSIS — M5416 Radiculopathy, lumbar region: Secondary | ICD-10-CM

## 2021-02-04 DIAGNOSIS — M545 Low back pain, unspecified: Secondary | ICD-10-CM | POA: Diagnosis not present

## 2021-02-04 MED ORDER — MELOXICAM 15 MG PO TABS: 15.0000 mg | ORAL_TABLET | Freq: Every day | ORAL | 2 refills | Status: DC

## 2021-02-04 NOTE — Progress Notes (Signed)
Office Visit Note   [name]           Date of Birth: 03-09-1946           MRN: [id number] Visit Date: 02/04/2021              Requested by: [name], MD [place],  [place] [id number] PCP: [name], MD   Assessment & Plan: Visit Diagnoses:  1. Radiculopathy, lumbar region   2. Low back pain, unspecified back pain laterality, unspecified chronicity, unspecified whether sciatica present   3. Unilateral primary osteoarthritis, right knee   4. Compression fracture of L1 vertebra, initial encounter ([place])   5. Spinal stenosis of lumbar region with neurogenic claudication     Plan: Knee is suffering from osteoarthritis, only real proven treatments are Weight loss, NSIADs like meloxicam and exercise. Well padded shoes help. Ice the knee that is suffering from osteoarthritis, only real proven treatments are Weight loss, NSIADs like meloxicam and exercise. Well padded shoes help. Ice the knee 2-3 times a day 15-20 mins at a time.-3 times a day 15-20 mins at a time. Hot showers in the AM.  Injection with steroid may be of benefit. Cane in the right hand to use with left leg weight bearing. Follow-Up Instructions: No follow-ups on file.   Follow-Up Instructions: No follow-ups on file.   Orders:  Orders Placed This Encounter  Procedures  . XR Lumbar Spine 2-3 Views   No orders of the defined types were placed in this encounter.     Procedures: No procedures performed   Clinical Data: No additional findings.   Subjective: Chief Complaint  Patient presents with  . Lower Back - Follow-up, Pain    75 year old female with history of back pain and previous treatment in pain management and physical therapy. Has seen Dr. Ernestina Patches and been evaluated and he has ask for an opinion concerning her lumbar pain. Most of her pain is right anterior knee and right anterior shin and is worse In the knee with standing and walking. She has  back pain that is sever when she first gets up.  No bowel or bladder difficulties. She is able to stand and walk. She gets no piece not sure how far she walks maybe one block walking the dog.    Review of Systems  Constitutional: Negative.   HENT: Negative.   Eyes: Negative.   Respiratory: Negative.   Cardiovascular: Negative.   Gastrointestinal: Negative.   Endocrine: Negative.   Genitourinary: Negative.   Musculoskeletal: Negative.   Skin: Negative.   Allergic/Immunologic: Negative.   Neurological: Negative.   Hematological: Negative.   Psychiatric/Behavioral: Negative.      Objective: Vital Signs: BP 128/82 (BP Location: Left Arm, Patient Position: Sitting)   Pulse 76   Ht 5\' 5"  (1.651 m)   Wt 176 lb (79.8 kg)   BMI 29.29 kg/m   Physical Exam Musculoskeletal:     Lumbar back: Negative right straight leg raise test and negative left straight leg raise test.     Right knee:     Instability Tests: Medial McMurray test positive and lateral McMurray test positive.     Right Knee Exam  Right knee exam is normal.  Muscle Strength  The patient has normal right knee strength.  Tenderness  The patient is experiencing tenderness in the medial joint line and medial retinaculum.  Range of Motion  Extension: normal  Flexion: normal  Tests  McMurray:  Medial - positive Lateral - positive Varus: negative Valgus: negative Lachman:  Anterior - negative    Posterior - negative   Back Exam   Tenderness  The patient is experiencing tenderness in the lumbar.  Range of Motion  Extension: abnormal  Flexion: abnormal  Lateral bend right: normal  Lateral bend left: normal  Rotation right: normal  Rotation left: normal   Muscle Strength  Right Quadriceps:  5/5  Left Quadriceps:  5/5  Right Hamstrings:  5/5  Left Hamstrings:  5/5   Tests  Straight leg raise right: negative Straight leg raise left: negative  Reflexes  Patellar: 0/4 Achilles: 0/4  Other  Toe  walk: normal Heel walk: normal      Specialty Comments:  No specialty comments available.  Imaging: No results found.   PMFS History: Patient Active Problem List   Diagnosis Date Noted  . Fever   . Staphylococcus aureus bacteremia with sepsis ([place])   . SIRS (systemic inflammatory response syndrome) ([place]) 11/30/2020  . Lumbar radiculopathy 10/10/2019  . Bilateral stenosis of lateral recess of lumbar spine 10/10/2019  . Hemorrhoids, external without complications 96/78/9381  . Depression   . Anxiety   . Rectal bleeding   . History of radiation therapy   . Malignant neoplasm of anal canal ([place]) 11/15/2011  . Hemorrhoids, internal, with bleeding 09/07/2011  . Acquired anal stenosis 09/07/2011  . Hyperlipidemia 09/07/2011  . Family history of breast cancer in sister 09/07/2011   Past Medical History:  Diagnosis Date  . 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 Anxiety   . Depression   . Full dentures   . Hemorrhoids    external  . History of radiation therapy 11/29/11 to 01/05/12   anal canal 5040 cGy 28 sessions, regional lymph nodes 4200 cGy 28 sessions  . Hyperlipidemia   . Rectal bleeding   . Vitamin D deficiency   . Wears glasses     Family History  Problem Relation Age of Onset  . Cancer Sister        breast/ age 28  . Cervical cancer Sister        29  . Alcohol abuse Maternal Uncle     Past Surgical History:  Procedure Laterality Date  . ABDOMINAL HYSTERECTOMY  1978   age 71  . BUBBLE STUDY  12/04/2020   Procedure: BUBBLE STUDY;  Surgeon: [name], MD;  Location: [facility];  Service: Cardiology;;  . EXAMINATION UNDER ANESTHESIA  06/21/2012   Procedure: Jasmine December UNDER ANESTHESIA;  Surgeon: [name], MD;  Location: [facility];  Service: General;  Laterality: N/A;  rectal exam under anesthesia, anal dilation, rectal biopsy  . [place]   Patient had to guess on the date.  [name] RECTAL BIOPSY  06/21/2012    Procedure: BIOPSY RECTAL;  Surgeon: [name], MD;  Location: [facility];  Service: General;  Laterality: N/A;  . TEE WITHOUT CARDIOVERSION N/A 12/04/2020   Procedure: TRANSESOPHAGEAL ECHOCARDIOGRAM (TEE);  Surgeon: [name], MD;  Location: [facility];  Service: Cardiology;  Laterality: N/A;  . TUBAL LIGATION     b/l  age 19   Social History   Occupational History  . Not on file  Tobacco Use  . Smoking status: Former Smoker    Packs/day: 0.50    Years: 25.00    Pack years: 12.50    Types: Cigarettes    Quit  date: 09/07/1987    Years since quitting: 33.4  . Smokeless tobacco: Never Used  Vaping Use  . Vaping Use: Never used  Substance and Sexual Activity  . Alcohol use: No  . Drug use: No  . Sexual activity: Not on file

## 2021-02-04 NOTE — Patient Instructions (Addendum)
Plan: Knee is suffering from osteoarthritis, only real proven treatments are Weight loss, NSIADs like meloxicam and exercise. Well padded shoes help. Ice the knee that is suffering from osteoarthritis, only real proven treatments are Weight loss, NSIADs like meloxicam and exercise. Well padded shoes help. Ice the knee 2-3 times a day 15-20 mins at a time.-3 times a day 15-20 mins at a time. Hot showers in the AM.  Injection with steroid may be of benefit. Cane in the right hand to use with left leg weight bearing. Follow-Up Instructions: No follow-ups on file.  Gabapentin for nerve pain. Avoid bending, stooping and avoid lifting weights greater than 10 lbs. Avoid prolong standing and walking. Avoid frequent bending and stooping  No lifting greater than 10 lbs. May use ice or moist heat for pain. Weight loss is of benefit. Handicap license is approved. Dr. Romona Curls secretary/Assistant will call to arrange for epidural steroid injection

## 2021-02-05 DIAGNOSIS — M5416 Radiculopathy, lumbar region: Secondary | ICD-10-CM | POA: Diagnosis not present

## 2021-02-05 DIAGNOSIS — G9341 Metabolic encephalopathy: Secondary | ICD-10-CM | POA: Diagnosis not present

## 2021-02-05 DIAGNOSIS — N390 Urinary tract infection, site not specified: Secondary | ICD-10-CM | POA: Diagnosis not present

## 2021-02-05 DIAGNOSIS — F32A Depression, unspecified: Secondary | ICD-10-CM | POA: Diagnosis not present

## 2021-02-05 DIAGNOSIS — E785 Hyperlipidemia, unspecified: Secondary | ICD-10-CM | POA: Diagnosis not present

## 2021-02-05 DIAGNOSIS — D649 Anemia, unspecified: Secondary | ICD-10-CM | POA: Diagnosis not present

## 2021-02-05 DIAGNOSIS — I10 Essential (primary) hypertension: Secondary | ICD-10-CM | POA: Diagnosis not present

## 2021-02-05 DIAGNOSIS — G629 Polyneuropathy, unspecified: Secondary | ICD-10-CM | POA: Diagnosis not present

## 2021-02-05 DIAGNOSIS — A4101 Sepsis due to Methicillin susceptible Staphylococcus aureus: Secondary | ICD-10-CM | POA: Diagnosis not present

## 2021-02-10 DIAGNOSIS — G9341 Metabolic encephalopathy: Secondary | ICD-10-CM | POA: Diagnosis not present

## 2021-02-10 DIAGNOSIS — M5416 Radiculopathy, lumbar region: Secondary | ICD-10-CM | POA: Diagnosis not present

## 2021-02-10 DIAGNOSIS — E785 Hyperlipidemia, unspecified: Secondary | ICD-10-CM | POA: Diagnosis not present

## 2021-02-10 DIAGNOSIS — A4101 Sepsis due to Methicillin susceptible Staphylococcus aureus: Secondary | ICD-10-CM | POA: Diagnosis not present

## 2021-02-10 DIAGNOSIS — G629 Polyneuropathy, unspecified: Secondary | ICD-10-CM | POA: Diagnosis not present

## 2021-02-10 DIAGNOSIS — D649 Anemia, unspecified: Secondary | ICD-10-CM | POA: Diagnosis not present

## 2021-02-10 DIAGNOSIS — I10 Essential (primary) hypertension: Secondary | ICD-10-CM | POA: Diagnosis not present

## 2021-02-10 DIAGNOSIS — N390 Urinary tract infection, site not specified: Secondary | ICD-10-CM | POA: Diagnosis not present

## 2021-02-10 DIAGNOSIS — F32A Depression, unspecified: Secondary | ICD-10-CM | POA: Diagnosis not present

## 2021-03-02 ENCOUNTER — Other Ambulatory Visit: Payer: Self-pay

## 2021-03-02 ENCOUNTER — Ambulatory Visit: Payer: Self-pay

## 2021-03-02 ENCOUNTER — Ambulatory Visit (INDEPENDENT_AMBULATORY_CARE_PROVIDER_SITE_OTHER): Payer: Medicare HMO | Admitting: Physical Medicine and Rehabilitation

## 2021-03-02 ENCOUNTER — Encounter: Payer: Self-pay | Admitting: Physical Medicine and Rehabilitation

## 2021-03-02 VITALS — BP 128/81 | HR 80

## 2021-03-02 DIAGNOSIS — M5416 Radiculopathy, lumbar region: Secondary | ICD-10-CM

## 2021-03-02 MED ORDER — BETAMETHASONE SOD PHOS & ACET 6 (3-3) MG/ML IJ SUSP
12.0000 mg | Freq: Once | INTRAMUSCULAR | Status: AC
Start: 2021-03-02 — End: 2021-03-02
  Administered 2021-03-02: 12 mg

## 2021-03-02 NOTE — Progress Notes (Signed)
Pt state lower back pain mostly on her right side. Pt state walking, standing and sitting makes the pain worse. Pt state she use heating pads to help ease her pain. Pt has hx of inj on 08/24/20 pt state it helped.  Numeric Pain Rating Scale and Functional Assessment Average Pain 8   In the last MONTH (on 0-10 scale) has pain interfered with the following?  1. General activity like being  able to carry out your everyday physical activities such as walking, climbing stairs, carrying groceries, or moving a chair?  Rating(8)   +Driver, -BT, -Dye Allergies.

## 2021-03-02 NOTE — Patient Instructions (Signed)

## 2021-03-02 NOTE — Procedures (Signed)
Lumbar Epidural Steroid Injection - Interlaminar Approach with Fluoroscopic Guidance  [name]      Date of Birth: 11/18/1945 MRN: [id number] PCP: [name], MD      Visit Date: 03/02/2021   Universal Protocol:     Consent Given By: the patient  Position: PRONE  Additional Comments: Vital signs were monitored before and after the procedure. Patient was prepped and draped in the usual sterile fashion. The correct patient, procedure, and site was verified.   Injection Procedure Details:   Procedure diagnoses: Lumbar radiculopathy [M54.16]   Meds Administered:  Meds ordered this encounter  Medications  . betamethasone acetate-betamethasone sodium phosphate (CELESTONE) injection 12 mg     Laterality: Right  Location/Site:  L5-S1  Needle: 3.5 in., 20 ga. Tuohy  Needle Placement: Paramedian epidural  Findings:   -Comments: Excellent flow of contrast into the epidural space.  Procedure Details: Using a paramedian approach from the side mentioned above, the region overlying the inferior lamina was localized under fluoroscopic visualization and the soft tissues overlying this structure were infiltrated with 4 ml. of 1% Lidocaine without Epinephrine. The Tuohy needle was inserted into the epidural space using a paramedian approach.   The epidural space was localized using loss of resistance along with counter oblique bi-planar fluoroscopic views.  After negative aspirate for air, blood, and CSF, a 2 ml. volume of Isovue-250 was injected into the epidural space and the flow of contrast was observed. Radiographs were obtained for documentation purposes.    The injectate was administered into the level noted above.   Additional Comments:  No complications occurred Dressing: 2 x 2 sterile gauze and Band-Aid    Post-procedure details: Patient was observed during the procedure. Post-procedure instructions were reviewed.  Patient left the clinic in stable  condition.

## 2021-03-02 NOTE — Progress Notes (Signed)
[name] - 75 y.o. female MRN [id number]  Date of birth: 18-May-1946  Office Visit Note: Visit Date: 03/02/2021 PCP: [name], MD Referred by: [name], MD  Subjective: Chief Complaint  Patient presents with  . Lower Back - Pain   HPI:  [name] is a 75 y.o. female who comes in today at the request of Dr. Basil Dess for planned Right L5-S1 Lumbar epidural steroid injection with fluoroscopic guidance.  The patient has failed conservative care including home exercise, medications, time and activity modification.  This injection will be diagnostic and hopefully therapeutic.  Please see requesting physician notes for further details and justification.   ROS Otherwise per HPI.  Assessment & Plan: Visit Diagnoses:    ICD-10-CM   1. Lumbar radiculopathy  M54.16 XR C-ARM NO REPORT    Epidural Steroid injection    betamethasone acetate-betamethasone sodium phosphate (CELESTONE) injection 12 mg    Plan: No additional findings.   Meds & Orders:  Meds ordered this encounter  Medications  . betamethasone acetate-betamethasone sodium phosphate (CELESTONE) injection 12 mg    Orders Placed This Encounter  Procedures  . XR C-ARM NO REPORT  . Epidural Steroid injection    Follow-up: Return in about 2 weeks (around 03/16/2021) for Basil Dess, MD.   Procedures: No procedures performed  Lumbar Epidural Steroid Injection - Interlaminar Approach with Fluoroscopic Guidance  [name]      Date of Birth: 1945/12/07 MRN: [id number] PCP: [name], MD      Visit Date: 03/02/2021   Universal Protocol:     Consent Given By: the patient  Position: PRONE  Additional Comments: Vital signs were monitored before and after the procedure. Patient was prepped and draped in the usual sterile fashion. The correct patient, procedure, and site was verified.   Injection Procedure Details:   Procedure diagnoses: Lumbar radiculopathy [M54.16]   Meds  Administered:  Meds ordered this encounter  Medications  . betamethasone acetate-betamethasone sodium phosphate (CELESTONE) injection 12 mg     Laterality: Right  Location/Site:  L5-S1  Needle: 3.5 in., 20 ga. Tuohy  Needle Placement: Paramedian epidural  Findings:   -Comments: Excellent flow of contrast into the epidural space.  Procedure Details: Using a paramedian approach from the side mentioned above, the region overlying the inferior lamina was localized under fluoroscopic visualization and the soft tissues overlying this structure were infiltrated with 4 ml. of 1% Lidocaine without Epinephrine. The Tuohy needle was inserted into the epidural space using a paramedian approach.   The epidural space was localized using loss of resistance along with counter oblique bi-planar fluoroscopic views.  After negative aspirate for air, blood, and CSF, a 2 ml. volume of Isovue-250 was injected into the epidural space and the flow of contrast was observed. Radiographs were obtained for documentation purposes.    The injectate was administered into the level noted above.   Additional Comments:  No complications occurred Dressing: 2 x 2 sterile gauze and Band-Aid    Post-procedure details: Patient was observed during the procedure. Post-procedure instructions were reviewed.  Patient left the clinic in stable condition.     Clinical History: [facility]  [address], [address]  [place], [place] [address] Tel: [phone] Fax:  [phone] Test Date:  01/09/2020  [name] DOB: Jul 16, 1946 Physician: Narda Amber, DO Sex: Female Height: 5\' 5"  Ref Phys: Narda Amber, DO ID#: [id number] Temp: 32.0C Technician:   Patient Complaints: This is a  75 year old female referred for evaluation of bilateral feet and leg numbness and tingling.  NCV & EMG Findings: Extensive electrodiagnostic testing of the right lower extremity and additional studies of  the left shows:  1. Bilateral sural and superficial peroneal sensory responses are within normal limits. 2. Bilateral peroneal motor responses are within normal limits.  Bilateral tibial motor responses show reduced amplitude (R2.9, L2.5 mV).   3. Bilateral H reflex studies are absent.   4. Chronic motor axonal loss changes are seen affecting the S1 myotome bilaterally, without accompanied active denervation.    Impression: 1. Chronic S1 radiculopathy affecting bilateral lower extremities, moderate. 2. There is no evidence of a large fiber sensorimotor polyneuropathy affecting the lower extremities.   --- Segmentation: Standard.    Alignment: Minimal grade 1 anterolisthesis of L4 on L5 and L5 on S1.    Vertebrae: No acute fracture, discitis-osteomyelitis, or aggressive  bone lesion. Chronic T10, T11, L1, L3 and L5 vertebral body  compression fractures. No focal marrow signal abnormality.    Conus medullaris and cauda equina: Conus extends to the T12-L1  level. Conus and cauda equina appear normal.    Paraspinal and other soft tissues: No acute paraspinal abnormality.    Disc levels:    Disc spaces: Degenerative disease with disc height loss at L5-S1.  Disc desiccation at L1-2, L2-3, L3-4 and L4-5.    T12-L1: Broad-based disc bulge mildly flattening the ventral thecal  sac. Mild bilateral facet arthropathy. No evidence of neural  foraminal stenosis. No central canal stenosis.    L1-L2: Mild broad-based disc bulge. Mild bilateral facet  arthropathy. No evidence of neural foraminal stenosis. No central  canal stenosis.    L2-L3: Mild broad-based disc bulge. Moderate bilateral facet  arthropathy. Mild bilateral foraminal stenosis. Mild spinal  stenosis.    L3-L4: Mild broad-based disc bulge flattening the ventral thecal  sac. Moderate bilateral facet arthropathy with ligamentum flavum  infolding. No evidence of neural foraminal stenosis. No central  canal  stenosis.    L4-L5: Broad-based disc bulge. Ankylosis of the posterior elements  at L4-5 with hypertrophic changes. Mild bilateral foraminal  stenosis. Mild spinal stenosis.    L5-S1: Broad-based disc bulge. Severe bilateral facet arthropathy  with bilateral facet effusions. Bilateral subarticular recess  stenosis. Severe left and moderate right foraminal stenosis.    IMPRESSION:  1. Diffuse lumbar spine spondylosis as detailed above.  2. No acute osseous injury of the lumbar spine.  3. No aggressive osseous lesion to suggest metastatic disease.      Electronically Signed  By: Kathreen Devoid  On: 09/07/2019 08:47     Objective:  VS:  HT:    WT:   BMI:     BP:128/81  HR:80bpm  TEMP: ( )  RESP:  Physical Exam Vitals and nursing note reviewed.  Constitutional:      General: She is not in acute distress.    Appearance: Normal appearance. She is not ill-appearing.  HENT:     Head: Normocephalic and atraumatic.     Right Ear: External ear normal.     Left Ear: External ear normal.  Eyes:     Extraocular Movements: Extraocular movements intact.  Cardiovascular:     Rate and Rhythm: Normal rate.     Pulses: Normal pulses.  Pulmonary:     Effort: Pulmonary effort is normal. No respiratory distress.  Abdominal:     General: There is no distension.     Palpations: Abdomen is soft.  Musculoskeletal:  General: Tenderness present.     Cervical back: Neck supple.     Right lower leg: No edema.     Left lower leg: No edema.     Comments: Patient has good distal strength with no pain over the greater trochanters.  No clonus or focal weakness.  Skin:    Findings: No erythema, lesion or rash.  Neurological:     General: No focal deficit present.     Mental Status: She is alert and oriented to person, place, and time.     Sensory: No sensory deficit.     Motor: No weakness or abnormal muscle tone.     Coordination: Coordination normal.  Psychiatric:         Mood and Affect: Mood normal.        Behavior: Behavior normal.      Imaging: No results found.

## 2021-03-05 ENCOUNTER — Other Ambulatory Visit: Payer: Self-pay

## 2021-03-05 ENCOUNTER — Encounter: Payer: Self-pay | Admitting: Specialist

## 2021-03-05 ENCOUNTER — Ambulatory Visit (INDEPENDENT_AMBULATORY_CARE_PROVIDER_SITE_OTHER): Payer: Medicare HMO | Admitting: Specialist

## 2021-03-05 VITALS — BP 134/78 | HR 79 | Ht 65.0 in | Wt 176.0 lb

## 2021-03-05 DIAGNOSIS — M1711 Unilateral primary osteoarthritis, right knee: Secondary | ICD-10-CM

## 2021-03-05 DIAGNOSIS — S32010A Wedge compression fracture of first lumbar vertebra, initial encounter for closed fracture: Secondary | ICD-10-CM

## 2021-03-05 DIAGNOSIS — M545 Low back pain, unspecified: Secondary | ICD-10-CM | POA: Diagnosis not present

## 2021-03-05 DIAGNOSIS — M25561 Pain in right knee: Secondary | ICD-10-CM

## 2021-03-05 DIAGNOSIS — M48062 Spinal stenosis, lumbar region with neurogenic claudication: Secondary | ICD-10-CM

## 2021-03-05 DIAGNOSIS — G8929 Other chronic pain: Secondary | ICD-10-CM

## 2021-03-05 MED ORDER — BUPIVACAINE HCL 0.5 % IJ SOLN
3.0000 mL | INTRAMUSCULAR | Status: AC | PRN
  Administered 2021-03-05: 3 mL via INTRA_ARTICULAR

## 2021-03-05 MED ORDER — METHYLPREDNISOLONE ACETATE 40 MG/ML IJ SUSP
40.0000 mg | INTRAMUSCULAR | Status: AC | PRN
Start: 2021-03-05 — End: 2021-03-05
  Administered 2021-03-05: 40 mg via INTRA_ARTICULAR

## 2021-03-05 MED ORDER — CELECOXIB 100 MG PO CAPS: 100.0000 mg | ORAL_CAPSULE | Freq: Two times a day (BID) | ORAL | 2 refills | Status: DC

## 2021-03-05 NOTE — Patient Instructions (Addendum)
Avoid frequent bending and stooping  No lifting greater than 10 lbs. May use ice or moist heat for pain. Weight loss is of benefit. Best medication for lumbar disc disease is arthritis medications like celebrex. Exercise is important to improve your indurance and does allow people to function better inspite of back pain. The main ways of treat osteoarthritis, that are found to be success. Weight loss helps to decrease pain. Exercise is important to maintaining cartilage and thickness and strengthening. NSAIDs like celebrex are meds decreasing the inflamation. Ice is okay  In afternoon and evening and hot shower in the am We are going to schedule you an appointment to see Dr. Marlou Sa to consider a right total knee replacement. Go for your appointment at the breast imaging center in October for testing for osteoporosis and to ensure that this is treated. If you have weak bones then our ability to perform surgery on the spine using metal Implants is not possible.

## 2021-03-05 NOTE — Progress Notes (Signed)
Office Visit Note   [name]           Date of Birth: [birth year]           MRN: [id number] Visit Date: 03/05/2021              Requested by: [name], MD [place],  [place] [address] PCP: [name], MD   Assessment & Plan: Visit Diagnoses:  1. Compression fracture of L1 vertebra, initial encounter ([place])   2. Spinal stenosis of lumbar region with neurogenic claudication   3. Unilateral primary osteoarthritis, right knee   4. Low back pain, unspecified back pain laterality, unspecified chronicity, unspecified whether sciatica present   5. Chronic pain of right knee     Plan: Avoid frequent bending and stooping  No lifting greater than 10 lbs. May use ice or moist heat for pain. Weight loss is of benefit. Best medication for lumbar disc disease is arthritis medications like celebrex. Exercise is important to improve your indurance and does allow people to function better inspite of back pain. The main ways of treat osteoarthritis, that are found to be success. Weight loss helps to decrease pain. Exercise is important to maintaining cartilage and thickness and strengthening. NSAIDs like celebrex are meds decreasing the inflamation. Ice is okay  In afternoon and evening and hot shower in the am We are going to schedule you an appointment to see Dr. Marlou Sa to consider a right total knee replacement. Go for your appointment at the breast imaging center in October for testing for osteoporosis and to ensure that this is treated. If you have weak bones then our ability to perform surgery on the spine using metal Implants is not possible.   Follow-Up Instructions: Return in about 3 months (around 06/05/2021), or See me in 3 months, for Make an appointment to see Dr. Marlou Sa in 2-3 weeks to consider having a right Total knee replacment .   Orders:  Orders Placed This Encounter  Procedures  . Large Joint Inj: R knee   No orders of the  defined types were placed in this encounter.     Procedures: Large Joint Inj: R knee on 03/05/2021 10:00 AM Indications: pain Details: 25 G 1.5 in needle, anteromedial approach  Arthrogram: No  Medications: 40 mg methylPREDNISolone acetate 40 MG/ML; 3 mL bupivacaine 0.5 % Outcome: tolerated well, no immediate complications Procedure, treatment alternatives, risks and benefits explained, specific risks discussed. Consent was given by the patient. Immediately prior to procedure a time out was called to verify the correct patient, procedure, equipment, support staff and site/side marked as required. Patient was prepped and draped in the usual sterile fashion.       Clinical Data: No additional findings.   Subjective: Chief Complaint  Patient presents with  . Lower Back - Follow-up  . Right Knee - Pain, Follow-up    75 year old female with history of back and right knee pain. Has history of previous multiple lumbar compression fractures. She underwent ESI with good relief of her back pain but still has pain in the right knee. Pain with standing and walking. Clinically right knee tricompartment osteoarthritis. She has tried tylenol for pain and not tried NSAIDs. Weight bearing increases her pain and she needs an assist device to get up with. Night pain is not as bad. There is bilateral foot numbness and tingling.    Review of Systems   Objective: Vital Signs: BP 134/78  Pulse 79   Ht 5\' 5"  (1.651 m)   Wt 176 lb (79.8 kg)   BMI 29.29 kg/m   Physical Exam  Ortho Exam  Specialty Comments:  No specialty comments available.  Imaging: No results found.   PMFS History: Patient Active Problem List   Diagnosis Date Noted  . Fever   . Staphylococcus aureus bacteremia with sepsis ([place])   . SIRS (systemic inflammatory response syndrome) ([place]) 11/30/2020  . Lumbar radiculopathy 10/10/2019  . Bilateral stenosis of lateral recess of lumbar spine 10/10/2019  . Hemorrhoids,  external without complications 17/51/0258  . Depression   . Anxiety   . Rectal bleeding   . History of radiation therapy   . Malignant neoplasm of anal canal ([place]) 11/15/2011  . Hemorrhoids, internal, with bleeding 09/07/2011  . Acquired anal stenosis 09/07/2011  . Hyperlipidemia 09/07/2011  . Family history of breast cancer in sister 09/07/2011   Past Medical History:  Diagnosis Date  . 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 Anxiety   . Depression   . Full dentures   . Hemorrhoids    external  . History of radiation therapy 11/29/11 to 01/05/12   anal canal 5040 cGy 28 sessions, regional lymph nodes 4200 cGy 28 sessions  . Hyperlipidemia   . Rectal bleeding   . Vitamin D deficiency   . Wears glasses     Family History  Problem Relation Age of Onset  . Cancer Sister        breast/ age 25  . Cervical cancer Sister        57  . Alcohol abuse Maternal Uncle     Past Surgical History:  Procedure Laterality Date  . ABDOMINAL HYSTERECTOMY  1978   age 54  . BUBBLE STUDY  12/04/2020   Procedure: BUBBLE STUDY;  Surgeon: [name], MD;  Location: [facility];  Service: Cardiology;;  . EXAMINATION UNDER ANESTHESIA  06/21/2012   Procedure: Jasmine December UNDER ANESTHESIA;  Surgeon: [name], MD;  Location: [facility];  Service: General;  Laterality: N/A;  rectal exam under anesthesia, anal dilation, rectal biopsy  . [place]   Patient had to guess on the date.  [name] RECTAL BIOPSY  06/21/2012   Procedure: BIOPSY RECTAL;  Surgeon: [name], MD;  Location: [facility];  Service: General;  Laterality: N/A;  . TEE WITHOUT CARDIOVERSION N/A 12/04/2020   Procedure: TRANSESOPHAGEAL ECHOCARDIOGRAM (TEE);  Surgeon: [name], MD;  Location: [facility];  Service: Cardiology;  Laterality: N/A;  . TUBAL LIGATION     b/l  age 11   Social History   Occupational History  . Not on file  Tobacco Use  . Smoking status:  Former Smoker    Packs/day: 0.50    Years: 25.00    Pack years: 12.50    Types: Cigarettes    Quit date: 09/07/1987    Years since quitting: 33.5  . Smokeless tobacco: Never Used  Vaping Use  . Vaping Use: Never used  Substance and Sexual Activity  . Alcohol use: No  . Drug use: No  . Sexual activity: Not on file

## 2021-03-19 ENCOUNTER — Ambulatory Visit (INDEPENDENT_AMBULATORY_CARE_PROVIDER_SITE_OTHER): Payer: Medicare HMO | Admitting: Orthopedic Surgery

## 2021-03-19 DIAGNOSIS — M1711 Unilateral primary osteoarthritis, right knee: Secondary | ICD-10-CM | POA: Diagnosis not present

## 2021-03-21 ENCOUNTER — Encounter: Payer: Self-pay | Admitting: Orthopedic Surgery

## 2021-03-21 NOTE — Progress Notes (Signed)
Office Visit Note   [name]           Date of Birth: 02-14-1946           MRN: [id number] Visit Date: 03/19/2021 Requested by: [name], MD [place],  [place] [address] PCP: [name], MD  Subjective: Chief Complaint  Patient presents with  . Right Knee - Follow-up    HPI: [name] is a 75 year old active patient with right knee pain.  Difficult for her to walk 10 minutes without having to stop due to the knee pain.  No prior surgery on the knee.  Reports pain primarily laterally but also global knee pain.  She has had prior lumbar spine ESI's which have never helped her knee pain.  Had right knee cortisone injection 03/05/2021 which did not give lasting relief.  She has been taking Mobic.  Has tried a brace which has not helped.  Has radiographic evidence of end-stage arthritis primarily in the lateral compartment.  Has occasional night pain.  She does have a daughter in [place] but no other support network.              ROS: All systems reviewed are negative as they relate to the chief complaint within the history of present illness.  Patient denies  fevers or chills.   Assessment & Plan: Visit Diagnoses:  1. Unilateral primary osteoarthritis, right knee     Plan: Impression is right knee arthritis.  No prior cardiac disease no personal or family history of DVT or pulmonary embolism.  Risk and benefits of total knee replacement are discussed with the patient including not limited to infection nerve and vessel damage incomplete pain relief as well as a grueling nature of the rehabilitative process which will be required.  She will likely have to go to skilled nursing for 1 to 2 weeks in order to achieve functional independence prior to returning home.  Patient understands the risk and benefits of surgery and wishes to proceed.  All questions answered.  Follow-Up Instructions: No follow-ups on file.   Orders:  No orders of the  defined types were placed in this encounter.  No orders of the defined types were placed in this encounter.     Procedures: No procedures performed   Clinical Data: No additional findings.  Objective: Vital Signs: There were no vitals taken for this visit.  Physical Exam:   Constitutional: Patient appears well-developed HEENT:  Head: Normocephalic Eyes:EOM are normal Neck: Normal range of motion Cardiovascular: Normal rate Pulmonary/chest: Effort normal Neurologic: Patient is alert Skin: Skin is warm Psychiatric: Patient has normal mood and affect    Ortho Exam: Ortho exam demonstrates slight valgus alignment right knee but has intact extensor mechanism and less than 5 degree flexion contracture.  Can bend it to about 120 valgus is partially correctable.  Pedal pulses palpable.  Ankle dorsiflexion intact.  Has lateral greater than medial joint line tenderness with patellofemoral crepitus present.  No groin pain with internal ex rotation of the leg.  Skin is intact in that right knee region  Specialty Comments:  No specialty comments available.  Imaging: No results found.   PMFS History: Patient Active Problem List   Diagnosis Date Noted  . Fever   . Staphylococcus aureus bacteremia with sepsis ([place])   . SIRS (systemic inflammatory response syndrome) ([place]) 11/30/2020  . Lumbar radiculopathy 10/10/2019  . Bilateral stenosis of lateral recess of lumbar spine 10/10/2019  . Hemorrhoids,  external without complications 36/14/4315  . Depression   . Anxiety   . Rectal bleeding   . History of radiation therapy   . Malignant neoplasm of anal canal ([place]) 11/15/2011  . Hemorrhoids, internal, with bleeding 09/07/2011  . Acquired anal stenosis 09/07/2011  . Hyperlipidemia 09/07/2011  . Family history of breast cancer in sister 09/07/2011   Past Medical History:  Diagnosis Date  . 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 Anxiety   . Depression   . Full  dentures   . Hemorrhoids    external  . History of radiation therapy 11/29/11 to 01/05/12   anal canal 5040 cGy 28 sessions, regional lymph nodes 4200 cGy 28 sessions  . Hyperlipidemia   . Rectal bleeding   . Vitamin D deficiency   . Wears glasses     Family History  Problem Relation Age of Onset  . Cancer Sister        breast/ age 80  . Cervical cancer Sister        77  . Alcohol abuse Maternal Uncle     Past Surgical History:  Procedure Laterality Date  . ABDOMINAL HYSTERECTOMY  1978   age 76  . BUBBLE STUDY  12/04/2020   Procedure: BUBBLE STUDY;  Surgeon: [name], MD;  Location: [facility];  Service: Cardiology;;  . EXAMINATION UNDER ANESTHESIA  06/21/2012   Procedure: Jasmine December UNDER ANESTHESIA;  Surgeon: [name], MD;  Location: [facility];  Service: General;  Laterality: N/A;  rectal exam under anesthesia, anal dilation, rectal biopsy  . [place]   Patient had to guess on the date.  [name] RECTAL BIOPSY  06/21/2012   Procedure: BIOPSY RECTAL;  Surgeon: [name], MD;  Location: [facility];  Service: General;  Laterality: N/A;  . TEE WITHOUT CARDIOVERSION N/A 12/04/2020   Procedure: TRANSESOPHAGEAL ECHOCARDIOGRAM (TEE);  Surgeon: [name], MD;  Location: [facility];  Service: Cardiology;  Laterality: N/A;  . TUBAL LIGATION     b/l  age 35   Social History   Occupational History  . Not on file  Tobacco Use  . Smoking status: Former Smoker    Packs/day: 0.50    Years: 25.00    Pack years: 12.50    Types: Cigarettes    Quit date: 09/07/1987    Years since quitting: 33.5  . Smokeless tobacco: Never Used  Vaping Use  . Vaping Use: Never used  Substance and Sexual Activity  . Alcohol use: No  . Drug use: No  . Sexual activity: Not on file

## 2021-04-01 ENCOUNTER — Other Ambulatory Visit: Payer: Self-pay

## 2021-05-06 ENCOUNTER — Ambulatory Visit: Payer: Self-pay

## 2021-05-06 ENCOUNTER — Other Ambulatory Visit: Payer: Self-pay

## 2021-05-06 ENCOUNTER — Ambulatory Visit (INDEPENDENT_AMBULATORY_CARE_PROVIDER_SITE_OTHER): Payer: Medicare HMO | Admitting: Specialist

## 2021-05-06 ENCOUNTER — Encounter: Payer: Self-pay | Admitting: Specialist

## 2021-05-06 VITALS — BP 133/82 | HR 88 | Ht 65.0 in | Wt 176.0 lb

## 2021-05-06 DIAGNOSIS — M4316 Spondylolisthesis, lumbar region: Secondary | ICD-10-CM

## 2021-05-06 DIAGNOSIS — S32010A Wedge compression fracture of first lumbar vertebra, initial encounter for closed fracture: Secondary | ICD-10-CM

## 2021-05-06 DIAGNOSIS — M48062 Spinal stenosis, lumbar region with neurogenic claudication: Secondary | ICD-10-CM

## 2021-05-06 DIAGNOSIS — M4807 Spinal stenosis, lumbosacral region: Secondary | ICD-10-CM | POA: Diagnosis not present

## 2021-05-06 NOTE — Progress Notes (Signed)
Office Visit Note   [name]           Date of Birth: 1946-04-07           MRN: [id number] Visit Date: 05/06/2021              Requested by: [name], MD [address],  [place] [address] PCP: [name], MD   Assessment & Plan: Visit Diagnoses:  1. Compression fracture of L1 vertebra, initial encounter ([place])   2. Spinal stenosis of lumbar region with neurogenic claudication   3. Spondylolisthesis, lumbar region   4. Spinal stenosis of lumbosacral region     Plan: Avoid bending, stooping and avoid lifting weights greater than 10 lbs. Avoid prolong standing and walking. Avoid frequent bending and stooping  No lifting greater than 10 lbs. May use ice or moist heat for pain. Weight loss is of benefit. Handicap license is approved. MRI of the lumbar spine as last was nearly 18 months ago, assess for cause of persisting sciatica right leg, and claudication symptoms. Will see prior to knee replacement and may also need a bone density test to consider intervention if she is not better with TKR.   Follow-Up Instructions: No follow-ups on file.   Orders:  Orders Placed This Encounter  Procedures   XR Lumbar Spine 2-3 Views   No orders of the defined types were placed in this encounter.     Procedures: No procedures performed   Clinical Data: No additional findings.   Subjective: Chief Complaint  Patient presents with   Lower Back - Follow-up    L1 Compression fracture follow up.  She states that her back is killing her.    75 year old female with right leg pain and discomfort, Dr. Marlou Sa is planning for right TKR 06/09/2021. She has pain into the right anterior thigh, knee and anterior right tibia. Previous injection with significant relief of pain right knee but also had improvement with lumbar ESIs that was temporary.  Pain with weight bearing and right leg pain. She has pain with ROM right knee and severe arthrosis of the  right knee.   Review of Systems  Constitutional: Negative.   HENT: Negative.    Eyes: Negative.   Respiratory: Negative.    Cardiovascular: Negative.   Gastrointestinal: Negative.   Endocrine: Negative.   Genitourinary: Negative.   Musculoskeletal: Negative.   Skin: Negative.   Allergic/Immunologic: Negative.   Neurological: Negative.   Hematological: Negative.   Psychiatric/Behavioral: Negative.      Objective: Vital Signs: BP 133/82 (BP Location: Left Arm, Patient Position: Sitting)   Pulse 88   Ht 5\' 5"  (1.651 m)   Wt 176 lb (79.8 kg)   BMI 29.29 kg/m   Physical Exam Constitutional:      Appearance: She is well-developed.  HENT:     Head: Normocephalic and atraumatic.  Eyes:     Pupils: Pupils are equal, round, and reactive to light.  Pulmonary:     Effort: Pulmonary effort is normal.     Breath sounds: Normal breath sounds.  Abdominal:     General: Bowel sounds are normal.     Palpations: Abdomen is soft.  Musculoskeletal:     Cervical back: Normal range of motion and neck supple.     Lumbar back: Negative right straight leg raise test and negative left straight leg raise test.  Skin:    General: Skin is warm and dry.  Neurological:  Mental Status: She is alert and oriented to person, place, and time.  Psychiatric:        Behavior: Behavior normal.        Thought Content: Thought content normal.        Judgment: Judgment normal.   Back Exam   Tenderness  The patient is experiencing tenderness in the lumbar.  Range of Motion  Extension:  abnormal  Flexion:  abnormal  Lateral bend right:  normal  Lateral bend left:  normal  Rotation right:  normal  Rotation left:  normal   Muscle Strength  Right Quadriceps:  5/5  Left Quadriceps:  5/5  Right Hamstrings:  5/5  Left Hamstrings:  5/5   Tests  Straight leg raise right: negative Straight leg raise left: negative  Reflexes  Patellar:  0/4 Achilles:  0/4 Biceps:  0/4 Babinski's sign: normal    Other  Toe walk: normal Heel walk: normal Sensation: normal Gait: normal  Erythema: no back redness Scars: absent    Specialty Comments:  No specialty comments available.  Imaging: No results found.   PMFS History: Patient Active Problem List   Diagnosis Date Noted   Fever    Staphylococcus aureus bacteremia with sepsis ([place])    SIRS (systemic inflammatory response syndrome) ([place]) 11/30/2020   Lumbar radiculopathy 10/10/2019   Bilateral stenosis of lateral recess of lumbar spine 10/10/2019   Hemorrhoids, external without complications 54/65/6812   Depression    Anxiety    Rectal bleeding    History of radiation therapy    Malignant neoplasm of anal canal ([place]) 11/15/2011   Hemorrhoids, internal, with bleeding 09/07/2011   Acquired anal stenosis 09/07/2011   Hyperlipidemia 09/07/2011   Family history of breast cancer in sister 09/07/2011   Past Medical History:  Diagnosis Date   anal ca dx'd 11/2011   squamous cell carcinoma   Anxiety    Depression    Full dentures    Hemorrhoids    external   History of radiation therapy 11/29/11 to 01/05/12   anal canal 5040 cGy 28 sessions, regional lymph nodes 4200 cGy 28 sessions   Hyperlipidemia    Rectal bleeding    Vitamin D deficiency    Wears glasses     Family History  Problem Relation Age of Onset   Cancer Sister        breast/ age 19   Cervical cancer Sister        25   Alcohol abuse Maternal Uncle     Past Surgical History:  Procedure Laterality Date   ABDOMINAL HYSTERECTOMY  1978   age 34   BUBBLE STUDY  12/04/2020   Procedure: BUBBLE STUDY;  Surgeon: [name], MD;  Location: [facility];  Service: Cardiology;;   EXAMINATION UNDER ANESTHESIA  06/21/2012   Procedure: EXAM UNDER ANESTHESIA;  Surgeon: [name], MD;  Location: [facility];  Service: General;  Laterality: N/A;  rectal exam under anesthesia, anal dilation, rectal biopsy   [place]   Patient had  to guess on the date.   RECTAL BIOPSY  06/21/2012   Procedure: BIOPSY RECTAL;  Surgeon: [name], MD;  Location: [facility];  Service: General;  Laterality: N/A;   TEE WITHOUT CARDIOVERSION N/A 12/04/2020   Procedure: TRANSESOPHAGEAL ECHOCARDIOGRAM (TEE);  Surgeon: [name], MD;  Location: [facility];  Service: Cardiology;  Laterality: N/A;   TUBAL LIGATION     b/l  age 46  Social History   Occupational History   Not on file  Tobacco Use   Smoking status: Former    Packs/day: 0.50    Years: 25.00    Pack years: 12.50    Types: Cigarettes    Quit date: 09/07/1987    Years since quitting: 33.6   Smokeless tobacco: Never  Vaping Use   Vaping Use: Never used  Substance and Sexual Activity   Alcohol use: No   Drug use: No   Sexual activity: Not on file

## 2021-05-06 NOTE — Patient Instructions (Signed)
Avoid bending, stooping and avoid lifting weights greater than 10 lbs. Avoid prolong standing and walking. Avoid frequent bending and stooping  No lifting greater than 10 lbs. May use ice or moist heat for pain. Weight loss is of benefit. Handicap license is approved. MRI of the lumbar spine as last was nearly 18 months ago, assess for cause of persisting sciatica right leg, and claudication symptoms. Will see prior to knee replacement and may also need a bone density test to consider intervention if she is not better with TKR.

## 2021-06-02 NOTE — Pre-Procedure Instructions (Signed)
Surgical Instructions    Your procedure is scheduled on Tuesday 06/09/21.   Report to [facility] Main Entrance "A" at 05:.30 A.M., then check in with the Admitting office.  Call this number if you have problems the morning of surgery:  [phone]   If you have any questions prior to your surgery date call [phone]: Open Monday-Friday 8am-4pm    Remember:  Do not eat after midnight the night before your surgery  You may drink clear liquids until 04:30 A.M. the morning of your surgery.   Clear liquids allowed are: Water, Non-Citrus Juices (without pulp), Carbonated Beverages, Clear Tea, Black Coffee Only, and Gatorade    Take these medicines the morning of surgery with A SIP OF WATER   ARIPiprazole (ABILIFY)  DULoxetine (CYMBALTA)   gabapentin (NEURONTIN)  levothyroxine (SYNTHROID, LEVOTHROID)  pravastatin (PRAVACHOL)   Take these medicines if needed:   acetaminophen (TYLENOL)  As of today, STOP taking any Aspirin (unless otherwise instructed by your surgeon) celecoxib (CELEBREX) Aleve, Naproxen, Ibuprofen, Motrin, Advil, Goody's, BC's, all herbal medications, fish oil, and all vitamins.                     Do NOT Smoke (Tobacco/Vaping) or drink Alcohol 24 hours prior to your procedure.  If you use a CPAP at night, you may bring all equipment for your overnight stay.   Contacts, glasses, piercing's, hearing aid's, dentures or partials may not be worn into surgery, please bring cases for these belongings.    For patients admitted to the hospital, discharge time will be determined by your treatment team.   Patients discharged the day of surgery will not be allowed to drive home, and someone needs to stay with them for 24 hours.  ONLY 1 SUPPORT PERSON MAY BE PRESENT WHILE YOU ARE IN SURGERY. IF YOU ARE TO BE ADMITTED ONCE YOU ARE IN YOUR ROOM YOU WILL BE ALLOWED TWO (2) VISITORS.  Minor children may have two parents present. Special consideration for safety and communication  needs will be reviewed on a case by case basis.   Special instructions:   [place]- Preparing For Surgery  Before surgery, you can play an important role. Because skin is not sterile, your skin needs to be as free of germs as possible. You can reduce the number of germs on your skin by washing with CHG (chlorahexidine gluconate) Soap before surgery.  CHG is an antiseptic cleaner which kills germs and bonds with the skin to continue killing germs even after washing.    Oral Hygiene is also important to reduce your risk of infection.  Remember - BRUSH YOUR TEETH THE MORNING OF SURGERY WITH YOUR REGULAR TOOTHPASTE  Please do not use if you have an allergy to CHG or antibacterial soaps. If your skin becomes reddened/irritated stop using the CHG.  Do not shave (including legs and underarms) for at least 48 hours prior to first CHG shower. It is OK to shave your face.  Please follow these instructions carefully.   Shower the NIGHT BEFORE SURGERY and the MORNING OF SURGERY  If you chose to wash your hair, wash your hair first as usual with your normal shampoo.  After you shampoo, rinse your hair and body thoroughly to remove the shampoo.  Use CHG Soap as you would any other liquid soap. You can apply CHG directly to the skin and wash gently with a scrungie or a clean washcloth.   Apply the CHG Soap to your body ONLY  FROM THE NECK DOWN.  Do not use on open wounds or open sores. Avoid contact with your eyes, ears, mouth and genitals (private parts). Wash Face and genitals (private parts)  with your normal soap.   Wash thoroughly, paying special attention to the area where your surgery will be performed.  Thoroughly rinse your body with warm water from the neck down.  DO NOT shower/wash with your normal soap after using and rinsing off the CHG Soap.  Pat yourself dry with a CLEAN TOWEL.  Wear CLEAN PAJAMAS to bed the night before surgery  Place CLEAN SHEETS on your bed the night before  your surgery  DO NOT SLEEP WITH PETS.   Day of Surgery: Shower with CHG soap. Do not wear jewelry, make up, nail polish, gel polish, artificial nails, or any other type of covering on natural nails including finger and toenails. If patients have artificial nails, gel coating, etc. that need to be removed by a nail salon please have this removed prior to surgery. Surgery may need to be canceled/delayed if the surgeon/ anesthesia feels like the patient is unable to be adequately monitored. Do not wear lotions, powders, perfumes/colognes, or deodorant. Do not shave 48 hours prior to surgery.  Men may shave face and neck. Do not bring valuables to the hospital. [facility] is not responsible for any belongings or valuables. Wear Clean/Comfortable clothing the morning of surgery Remember to brush your teeth WITH YOUR REGULAR TOOTHPASTE.   Please read over the following fact sheets that you were given.

## 2021-06-03 ENCOUNTER — Other Ambulatory Visit: Payer: Self-pay

## 2021-06-03 ENCOUNTER — Encounter (HOSPITAL_COMMUNITY)
Admission: RE | Admit: 2021-06-03 | Discharge: 2021-06-03 | Disposition: A | Discharge status: Home / Self Care | Payer: Medicare HMO | Source: Ambulatory Visit | Attending: Orthopedic Surgery | Admitting: Orthopedic Surgery

## 2021-06-03 ENCOUNTER — Encounter (HOSPITAL_COMMUNITY): Payer: Self-pay

## 2021-06-03 ENCOUNTER — Telehealth: Payer: Self-pay

## 2021-06-03 DIAGNOSIS — Z01812 Encounter for preprocedural laboratory examination: Secondary | ICD-10-CM | POA: Diagnosis present

## 2021-06-03 LAB — CBC
HCT: 40.1 % (ref 36.0–46.0)
Hemoglobin: 13 g/dL (ref 12.0–15.0)
MCH: 33.1 pg (ref 26.0–34.0)
MCHC: 32.4 g/dL (ref 30.0–36.0)
MCV: 102 fL — ABNORMAL HIGH (ref 80.0–100.0)
Platelets: 177 10*3/uL (ref 150–400)
RBC: 3.93 MIL/uL (ref 3.87–5.11)
RDW: 13 % (ref 11.5–15.5)
WBC: 3.2 10*3/uL — ABNORMAL LOW (ref 4.0–10.5)
nRBC: 0 % (ref 0.0–0.2)

## 2021-06-03 LAB — URINALYSIS, ROUTINE W REFLEX MICROSCOPIC
Bilirubin Urine: NEGATIVE
Glucose, UA: NEGATIVE mg/dL
Hgb urine dipstick: NEGATIVE
Ketones, ur: NEGATIVE mg/dL
Leukocytes,Ua: NEGATIVE
Nitrite: NEGATIVE
Protein, ur: NEGATIVE mg/dL
Specific Gravity, Urine: 1.01 (ref 1.005–1.030)
pH: 5.5 (ref 5.0–8.0)

## 2021-06-03 LAB — BASIC METABOLIC PANEL
Anion gap: 10 (ref 5–15)
BUN: 8 mg/dL (ref 8–23)
CO2: 26 mmol/L (ref 22–32)
Calcium: 9.3 mg/dL (ref 8.9–10.3)
Chloride: 103 mmol/L (ref 98–111)
Creatinine, Ser: 0.76 mg/dL (ref 0.44–1.00)
GFR, Estimated: >60 mL/min (ref >60)
Glucose, Bld: 98 mg/dL (ref 70–99)
Potassium: 3.5 mmol/L (ref 3.5–5.1)
Sodium: 139 mmol/L (ref 135–145)

## 2021-06-03 NOTE — Telephone Encounter (Signed)
Stay in the hospital for several days before transfer to SNF for 1-2 weeks

## 2021-06-03 NOTE — Telephone Encounter (Signed)
Pt called and wanted to know if she was going to be sent to rehab after her surgery or if she will stay in the hospital

## 2021-06-03 NOTE — Progress Notes (Signed)
PCP - Dr. [name] Cardiologist - Denies  Chest x-ray - 11/03/20 EKG - 12/02/20 Stress Test - Denies ECHO - 12/02/20 Cardiac Cath - Denies  Aspirin Instructions: Instructed to call Dr. Marlou Sa regarding Aspirin and when to stop   ERAS Protcol - yes PRE-SURGERY Ensure    COVID TEST- Given Covid test site address and phone number   Anesthesia review: No  Patient denies shortness of breath, fever, cough and chest pain at PAT appointment   All instructions explained to the patient, with a verbal understanding of the material. Patient agrees to go over the instructions while at home for a better understanding. Patient also instructed to wear mask in public after being tested for COVID-19. The opportunity to ask questions was provided.

## 2021-06-04 ENCOUNTER — Encounter: Payer: Self-pay | Admitting: Surgery

## 2021-06-04 ENCOUNTER — Ambulatory Visit (INDEPENDENT_AMBULATORY_CARE_PROVIDER_SITE_OTHER): Payer: Medicare HMO | Admitting: Surgery

## 2021-06-04 ENCOUNTER — Ambulatory Visit: Payer: Self-pay

## 2021-06-04 DIAGNOSIS — M48062 Spinal stenosis, lumbar region with neurogenic claudication: Secondary | ICD-10-CM

## 2021-06-04 DIAGNOSIS — S32010A Wedge compression fracture of first lumbar vertebra, initial encounter for closed fracture: Secondary | ICD-10-CM

## 2021-06-04 LAB — URINE CULTURE: Culture: <10000 — AB

## 2021-06-04 LAB — SURGICAL PCR SCREEN
MRSA, PCR: NEGATIVE
Staphylococcus aureus: POSITIVE — AB

## 2021-06-04 NOTE — Progress Notes (Signed)
Office Visit Note   [name]           Date of Birth: 05-Apr-1946           MRN: [id number] Visit Date: 06/04/2021              Requested by: [name], MD [address],  [place] [address] PCP: [name], MD   Assessment & Plan: Visit Diagnoses:  1. Compression fracture of L1 vertebra, initial encounter ([place])   2. Spinal stenosis of lumbar region with neurogenic claudication     Plan: Patient will proceed with right total knee replacement Dr. Marlou Sa is scheduled for next week.  Advised patient that if Dr. Louanne Skye sees something on her scan that needs to be addressed sooner than later he will contact her.  Follow-up in 2 months for recheck but she will return sooner if needed.  Follow-Up Instructions: Return in about 2 months (around 08/04/2021) for with dr Louanne Skye recheck for lumbar.   Orders:  Orders Placed This Encounter  Procedures   XR Lumbar Spine 2-3 Views   No orders of the defined types were placed in this encounter.     Procedures: No procedures performed   Clinical Data: No additional findings.   Subjective: Chief Complaint  Patient presents with   Lower Back - Follow-up, Fracture    HPI 75 year old white female returns for recheck of her low back pain, left lower extremity radiculopathy and L1 compression fracture.  She continues have ongoing back pain and left leg symptoms.  She is scheduled for right total knee replacement with Dr. Marlou Sa next week.  States that she is wanting to proceed with the right total knee replacement.    Objective: Vital Signs: There were no vitals taken for this visit.  Physical Exam Gait is antalgic.  Patient has lumbar paraspinal tenderness.  Tender over the bilateral SI joints.  Negative logroll.  Negative straight leg raise.  Right knee medial joint line tenderness. Ortho Exam  Specialty Comments:  No specialty comments available.  Imaging: No results found.   PMFS  History: Patient Active Problem List   Diagnosis Date Noted   Fever    Staphylococcus aureus bacteremia with sepsis ([place])    SIRS (systemic inflammatory response syndrome) ([place]) 11/30/2020   Lumbar radiculopathy 10/10/2019   Bilateral stenosis of lateral recess of lumbar spine 10/10/2019   Hemorrhoids, external without complications 0000000   Depression    Anxiety    Rectal bleeding    History of radiation therapy    Malignant neoplasm of anal canal ([place]) 11/15/2011   Hemorrhoids, internal, with bleeding 09/07/2011   Acquired anal stenosis 09/07/2011   Hyperlipidemia 09/07/2011   Family history of breast cancer in sister 09/07/2011   Past Medical History:  Diagnosis Date   anal ca dx'd 11/2011   squamous cell carcinoma   Anxiety    Arthritis 2022   Depression    Full dentures    Hemorrhoids    external   History of radiation therapy 11/29/11 to 01/05/12   anal canal 5040 cGy 28 sessions, regional lymph nodes 4200 cGy 28 sessions   Hyperlipidemia    Rectal bleeding    Vitamin D deficiency    Wears glasses     Family History  Problem Relation Age of Onset   Cancer Sister        breast/ age 49   Cervical cancer Sister        56  Alcohol abuse Maternal Uncle     Past Surgical History:  Procedure Laterality Date   ABDOMINAL HYSTERECTOMY  1978   age 59   BUBBLE STUDY  12/04/2020   Procedure: BUBBLE STUDY;  Surgeon: [name], MD;  Location: [facility];  Service: Cardiology;;   EXAMINATION UNDER ANESTHESIA  06/21/2012   Procedure: EXAM UNDER ANESTHESIA;  Surgeon: [name], MD;  Location: [facility];  Service: General;  Laterality: N/A;  rectal exam under anesthesia, anal dilation, rectal biopsy   [place]   Patient had to guess on the date.   RECTAL BIOPSY  06/21/2012   Procedure: BIOPSY RECTAL;  Surgeon: [name], MD;  Location: [facility];  Service: General;  Laterality: N/A;   TEE WITHOUT  CARDIOVERSION N/A 12/04/2020   Procedure: TRANSESOPHAGEAL ECHOCARDIOGRAM (TEE);  Surgeon: [name], MD;  Location: [facility];  Service: Cardiology;  Laterality: N/A;   TUBAL LIGATION     b/l  age 11   Social History   Occupational History   Not on file  Tobacco Use   Smoking status: Former    Packs/day: 0.50    Years: 25.00    Pack years: 12.50    Types: Cigarettes    Quit date: 09/07/1987    Years since quitting: 33.7   Smokeless tobacco: Never  Vaping Use   Vaping Use: Never used  Substance and Sexual Activity   Alcohol use: No   Drug use: No   Sexual activity: Not Currently

## 2021-06-04 NOTE — Telephone Encounter (Signed)
If that's her desire, she may want to see if she can pre-arrange that with them prior to procedure

## 2021-06-04 NOTE — Telephone Encounter (Signed)
Patient has been notified of post surgical plans. She is requesting to be able to be sent for [facility] center for nursing facility.

## 2021-06-05 ENCOUNTER — Other Ambulatory Visit (HOSPITAL_COMMUNITY): Payer: Medicare HMO

## 2021-06-05 ENCOUNTER — Telehealth: Payer: Self-pay | Admitting: Orthopedic Surgery

## 2021-06-05 NOTE — Telephone Encounter (Signed)
thanks

## 2021-06-05 NOTE — Telephone Encounter (Signed)
Contacted patient and made her aware that prior to discharge she can make her request however it is not always guaranteed. Patient understood and had no questions or concerns.

## 2021-06-05 NOTE — Telephone Encounter (Signed)
Contacted patient and questions and concerns have been addressed.

## 2021-06-05 NOTE — Telephone Encounter (Signed)
Pt returning call for [name]. Pt had a follow up question about what to bring with her for surg. The best call back number is [phone]

## 2021-06-08 ENCOUNTER — Other Ambulatory Visit: Payer: Self-pay | Admitting: Orthopedic Surgery

## 2021-06-08 LAB — SARS CORONAVIRUS 2 (TAT 6-24 HRS): SARS Coronavirus 2: NEGATIVE

## 2021-06-08 MED ORDER — TRANEXAMIC ACID 1000 MG/10ML IV SOLN
2000.0000 mg | INTRAVENOUS | Status: DC
  Filled 2021-06-08: qty 20

## 2021-06-09 ENCOUNTER — Ambulatory Visit (HOSPITAL_COMMUNITY): Payer: Medicare HMO | Admitting: Certified Registered Nurse Anesthetist

## 2021-06-09 ENCOUNTER — Encounter (HOSPITAL_COMMUNITY)
Admission: AD | Disposition: A | Discharge status: Skilled Nursing Facility | Payer: Self-pay | Source: Home / Self Care | Attending: Orthopedic Surgery

## 2021-06-09 ENCOUNTER — Inpatient Hospital Stay (HOSPITAL_COMMUNITY)
Admission: AD | Admit: 2021-06-09 | Discharge: 2021-06-15 | DRG: 470 | Disposition: A | Discharge status: Skilled Nursing Facility | Payer: Medicare HMO | Attending: Orthopedic Surgery | Admitting: Orthopedic Surgery

## 2021-06-09 ENCOUNTER — Other Ambulatory Visit: Payer: Self-pay

## 2021-06-09 ENCOUNTER — Encounter (HOSPITAL_COMMUNITY): Payer: Self-pay | Admitting: Orthopedic Surgery

## 2021-06-09 DIAGNOSIS — F05 Delirium due to known physiological condition: Secondary | ICD-10-CM | POA: Diagnosis not present

## 2021-06-09 DIAGNOSIS — Z803 Family history of malignant neoplasm of breast: Secondary | ICD-10-CM

## 2021-06-09 DIAGNOSIS — F419 Anxiety disorder, unspecified: Secondary | ICD-10-CM | POA: Diagnosis present

## 2021-06-09 DIAGNOSIS — M1711 Unilateral primary osteoarthritis, right knee: Secondary | ICD-10-CM | POA: Diagnosis not present

## 2021-06-09 DIAGNOSIS — Z96651 Presence of right artificial knee joint: Secondary | ICD-10-CM

## 2021-06-09 DIAGNOSIS — R400 Somnolence: Secondary | ICD-10-CM | POA: Diagnosis not present

## 2021-06-09 DIAGNOSIS — F32A Depression, unspecified: Secondary | ICD-10-CM | POA: Diagnosis present

## 2021-06-09 DIAGNOSIS — Z20822 Contact with and (suspected) exposure to covid-19: Secondary | ICD-10-CM | POA: Diagnosis present

## 2021-06-09 DIAGNOSIS — E785 Hyperlipidemia, unspecified: Secondary | ICD-10-CM | POA: Diagnosis present

## 2021-06-09 DIAGNOSIS — Z923 Personal history of irradiation: Secondary | ICD-10-CM

## 2021-06-09 DIAGNOSIS — Z85048 Personal history of other malignant neoplasm of rectum, rectosigmoid junction, and anus: Secondary | ICD-10-CM

## 2021-06-09 HISTORY — PX: TOTAL KNEE ARTHROPLASTY: SHX125

## 2021-06-09 SURGERY — ARTHROPLASTY, KNEE, TOTAL
Anesthesia: Spinal | Site: Knee | Laterality: Right

## 2021-06-09 MED ORDER — LACTATED RINGERS IV SOLN: INTRAVENOUS | Status: DC

## 2021-06-09 MED ORDER — EPHEDRINE SULFATE-NACL 50-0.9 MG/10ML-% IV SOSY
PREFILLED_SYRINGE | INTRAVENOUS | Status: DC | PRN
  Administered 2021-06-09: 5 mg via INTRAVENOUS
  Administered 2021-06-09: 10 mg via INTRAVENOUS
  Administered 2021-06-09: 5 mg via INTRAVENOUS

## 2021-06-09 MED ORDER — DOCUSATE SODIUM 100 MG PO CAPS
100.0000 mg | ORAL_CAPSULE | Freq: Two times a day (BID) | ORAL | Status: DC
  Administered 2021-06-09 – 2021-06-15 (×8): 100 mg via ORAL
  Filled 2021-06-09 (×11): qty 1

## 2021-06-09 MED ORDER — GABAPENTIN 600 MG PO TABS: 600.0000 mg | ORAL_TABLET | Freq: Two times a day (BID) | ORAL | Status: DC

## 2021-06-09 MED ORDER — HYDROMORPHONE HCL 1 MG/ML IJ SOLN: 0.2500 mg | INTRAMUSCULAR | Status: DC | PRN

## 2021-06-09 MED ORDER — CEFAZOLIN SODIUM-DEXTROSE 2-4 GM/100ML-% IV SOLN
2.0000 g | INTRAVENOUS | Status: AC
  Administered 2021-06-09: 2 g via INTRAVENOUS
  Filled 2021-06-09: qty 100

## 2021-06-09 MED ORDER — ONDANSETRON HCL 4 MG PO TABS: 4.0000 mg | ORAL_TABLET | Freq: Four times a day (QID) | ORAL | Status: DC | PRN

## 2021-06-09 MED ORDER — POVIDONE-IODINE 10 % EX SWAB: 2.0000 "application " | Freq: Once | CUTANEOUS | Status: AC

## 2021-06-09 MED ORDER — MIDAZOLAM HCL 5 MG/5ML IJ SOLN
INTRAMUSCULAR | Status: DC | PRN
  Administered 2021-06-09: 1 mg via INTRAVENOUS

## 2021-06-09 MED ORDER — BUPIVACAINE LIPOSOME 1.3 % IJ SUSP
INTRAMUSCULAR | Status: AC
  Filled 2021-06-09: qty 20

## 2021-06-09 MED ORDER — VANCOMYCIN HCL 1000 MG IV SOLR
INTRAVENOUS | Status: AC
  Filled 2021-06-09: qty 1000

## 2021-06-09 MED ORDER — MORPHINE SULFATE (PF) 4 MG/ML IV SOLN
INTRAVENOUS | Status: AC
  Filled 2021-06-09: qty 2

## 2021-06-09 MED ORDER — CLONIDINE HCL (ANALGESIA) 100 MCG/ML EP SOLN
EPIDURAL | Status: AC
  Filled 2021-06-09: qty 10

## 2021-06-09 MED ORDER — ACETAMINOPHEN 10 MG/ML IV SOLN
INTRAVENOUS | Status: DC | PRN
  Administered 2021-06-09: 1000 mg via INTRAVENOUS

## 2021-06-09 MED ORDER — ONDANSETRON HCL 4 MG/2ML IJ SOLN
INTRAMUSCULAR | Status: AC
  Filled 2021-06-09: qty 2

## 2021-06-09 MED ORDER — LEVOTHYROXINE SODIUM 25 MCG PO TABS
25.0000 ug | ORAL_TABLET | Freq: Every day | ORAL | Status: DC
  Administered 2021-06-10 – 2021-06-15 (×6): 25 ug via ORAL
  Filled 2021-06-09 (×6): qty 1

## 2021-06-09 MED ORDER — FENTANYL CITRATE (PF) 250 MCG/5ML IJ SOLN
INTRAMUSCULAR | Status: AC
  Filled 2021-06-09: qty 5

## 2021-06-09 MED ORDER — PHENYLEPHRINE HCL-NACL 20-0.9 MG/250ML-% IV SOLN
INTRAVENOUS | Status: DC | PRN
  Administered 2021-06-09: 25 ug/min via INTRAVENOUS

## 2021-06-09 MED ORDER — ACETAMINOPHEN 325 MG PO TABS
325.0000 mg | ORAL_TABLET | Freq: Four times a day (QID) | ORAL | Status: DC | PRN
  Administered 2021-06-10 – 2021-06-12 (×4): 650 mg via ORAL
  Filled 2021-06-09 (×4): qty 2

## 2021-06-09 MED ORDER — MORPHINE SULFATE (PF) 4 MG/ML IV SOLN
INTRAVENOUS | Status: DC | PRN
  Administered 2021-06-09: 8 mg via INTRAVENOUS

## 2021-06-09 MED ORDER — ORAL CARE MOUTH RINSE: 15.0000 mL | Freq: Once | OROMUCOSAL | Status: AC

## 2021-06-09 MED ORDER — CHLORHEXIDINE GLUCONATE 0.12 % MT SOLN: 15.0000 mL | Freq: Once | OROMUCOSAL | Status: AC

## 2021-06-09 MED ORDER — PROPOFOL 10 MG/ML IV BOLUS
INTRAVENOUS | Status: AC
  Filled 2021-06-09: qty 20

## 2021-06-09 MED ORDER — VANCOMYCIN HCL 1000 MG IV SOLR
INTRAVENOUS | Status: DC | PRN
  Administered 2021-06-09: 1000 mg

## 2021-06-09 MED ORDER — DULOXETINE HCL 60 MG PO CPEP
120.0000 mg | ORAL_CAPSULE | Freq: Every day | ORAL | Status: DC
  Administered 2021-06-10 – 2021-06-15 (×6): 120 mg via ORAL
  Filled 2021-06-09 (×6): qty 2

## 2021-06-09 MED ORDER — ACETAMINOPHEN 500 MG PO TABS
1000.0000 mg | ORAL_TABLET | Freq: Four times a day (QID) | ORAL | Status: AC
  Administered 2021-06-09 – 2021-06-10 (×4): 1000 mg via ORAL
  Filled 2021-06-09 (×4): qty 2

## 2021-06-09 MED ORDER — BUPIVACAINE HCL (PF) 0.25 % IJ SOLN
INTRAMUSCULAR | Status: AC
  Filled 2021-06-09: qty 30

## 2021-06-09 MED ORDER — ALBUMIN HUMAN 5 % IV SOLN: INTRAVENOUS | Status: DC | PRN

## 2021-06-09 MED ORDER — HYDROMORPHONE HCL 1 MG/ML IJ SOLN
0.5000 mg | INTRAMUSCULAR | Status: DC | PRN
  Administered 2021-06-11: 0.5 mg via INTRAVENOUS
  Filled 2021-06-09: qty 0.5

## 2021-06-09 MED ORDER — OXYCODONE HCL 5 MG/5ML PO SOLN: 5.0000 mg | Freq: Once | ORAL | Status: DC | PRN

## 2021-06-09 MED ORDER — OXYCODONE HCL 5 MG PO TABS
5.0000 mg | ORAL_TABLET | ORAL | Status: DC | PRN
  Administered 2021-06-09: 10 mg via ORAL
  Administered 2021-06-10: 5 mg via ORAL
  Filled 2021-06-09: qty 1
  Filled 2021-06-09: qty 2

## 2021-06-09 MED ORDER — ATROPINE SULFATE 0.4 MG/ML IJ SOLN
INTRAMUSCULAR | Status: AC
  Filled 2021-06-09: qty 1

## 2021-06-09 MED ORDER — PROPOFOL 1000 MG/100ML IV EMUL
INTRAVENOUS | Status: AC
  Filled 2021-06-09: qty 100

## 2021-06-09 MED ORDER — PHENOL 1.4 % MT LIQD: 1.0000 | OROMUCOSAL | Status: DC | PRN

## 2021-06-09 MED ORDER — OXYCODONE HCL 5 MG PO TABS: 5.0000 mg | ORAL_TABLET | Freq: Once | ORAL | Status: DC | PRN

## 2021-06-09 MED ORDER — POVIDONE-IODINE 10 % EX SWAB
2.0000 "application " | Freq: Once | CUTANEOUS | Status: AC
  Administered 2021-06-09: 2 via TOPICAL

## 2021-06-09 MED ORDER — ACETAMINOPHEN 10 MG/ML IV SOLN
INTRAVENOUS | Status: AC
  Filled 2021-06-09: qty 100

## 2021-06-09 MED ORDER — BUPIVACAINE HCL (PF) 0.75 % IJ SOLN
INTRAMUSCULAR | Status: DC | PRN
  Administered 2021-06-09: 1.6 mL

## 2021-06-09 MED ORDER — 0.9 % SODIUM CHLORIDE (POUR BTL) OPTIME
TOPICAL | Status: DC | PRN
  Administered 2021-06-09: 4000 mL
  Administered 2021-06-09: 1000 mL

## 2021-06-09 MED ORDER — ONDANSETRON HCL 4 MG/2ML IJ SOLN: 4.0000 mg | Freq: Once | INTRAMUSCULAR | Status: DC | PRN

## 2021-06-09 MED ORDER — TRANEXAMIC ACID 1000 MG/10ML IV SOLN
INTRAVENOUS | Status: DC | PRN
  Administered 2021-06-09: 2000 mg via TOPICAL

## 2021-06-09 MED ORDER — BUPIVACAINE HCL (PF) 0.5 % IJ SOLN
INTRAMUSCULAR | Status: DC | PRN
  Administered 2021-06-09: 20 mL via PERINEURAL

## 2021-06-09 MED ORDER — SODIUM CHLORIDE 0.9 % IR SOLN
Status: DC | PRN
  Administered 2021-06-09: 3000 mL

## 2021-06-09 MED ORDER — ATROPINE SULFATE 0.4 MG/ML IV SOSY
PREFILLED_SYRINGE | INTRAVENOUS | Status: DC | PRN
  Administered 2021-06-09: .4 mg via INTRAVENOUS

## 2021-06-09 MED ORDER — CHLORHEXIDINE GLUCONATE 0.12 % MT SOLN
OROMUCOSAL | Status: AC
  Administered 2021-06-09: 15 mL via OROMUCOSAL
  Filled 2021-06-09: qty 15

## 2021-06-09 MED ORDER — BUPIVACAINE HCL 0.25 % IJ SOLN
INTRAMUSCULAR | Status: DC | PRN
  Administered 2021-06-09: 20 mL
  Administered 2021-06-09: 10 mL

## 2021-06-09 MED ORDER — METOCLOPRAMIDE HCL 5 MG PO TABS: 5.0000 mg | ORAL_TABLET | Freq: Three times a day (TID) | ORAL | Status: DC | PRN

## 2021-06-09 MED ORDER — ASPIRIN 81 MG PO CHEW
81.0000 mg | CHEWABLE_TABLET | Freq: Two times a day (BID) | ORAL | Status: DC
  Administered 2021-06-09 – 2021-06-15 (×12): 81 mg via ORAL
  Filled 2021-06-09 (×12): qty 1

## 2021-06-09 MED ORDER — POVIDONE-IODINE 7.5 % EX SOLN
Freq: Once | CUTANEOUS | Status: DC
  Filled 2021-06-09: qty 118

## 2021-06-09 MED ORDER — FENTANYL CITRATE (PF) 100 MCG/2ML IJ SOLN
INTRAMUSCULAR | Status: DC | PRN
  Administered 2021-06-09: 50 ug via INTRAVENOUS

## 2021-06-09 MED ORDER — GABAPENTIN 300 MG PO CAPS
600.0000 mg | ORAL_CAPSULE | Freq: Two times a day (BID) | ORAL | Status: DC
  Administered 2021-06-10 – 2021-06-15 (×11): 600 mg via ORAL
  Filled 2021-06-09 (×11): qty 2

## 2021-06-09 MED ORDER — CLONIDINE HCL (ANALGESIA) 100 MCG/ML EP SOLN
EPIDURAL | Status: DC | PRN
  Administered 2021-06-09: 1 mL

## 2021-06-09 MED ORDER — CELECOXIB 100 MG PO CAPS
100.0000 mg | ORAL_CAPSULE | Freq: Two times a day (BID) | ORAL | Status: DC
  Administered 2021-06-10 – 2021-06-15 (×11): 100 mg via ORAL
  Filled 2021-06-09 (×12): qty 1

## 2021-06-09 MED ORDER — SODIUM CHLORIDE 0.9% FLUSH
INTRAVENOUS | Status: DC | PRN
  Administered 2021-06-09: 20 mL

## 2021-06-09 MED ORDER — MENTHOL 3 MG MT LOZG: 1.0000 | LOZENGE | OROMUCOSAL | Status: DC | PRN

## 2021-06-09 MED ORDER — TRANEXAMIC ACID-NACL 1000-0.7 MG/100ML-% IV SOLN
1000.0000 mg | INTRAVENOUS | Status: AC
  Administered 2021-06-09: 1000 mg via INTRAVENOUS
  Filled 2021-06-09: qty 100

## 2021-06-09 MED ORDER — EPHEDRINE 5 MG/ML INJ
INTRAVENOUS | Status: AC
  Filled 2021-06-09: qty 5

## 2021-06-09 MED ORDER — IRRISEPT - 450ML BOTTLE WITH 0.05% CHG IN STERILE WATER, USP 99.95% OPTIME
TOPICAL | Status: DC | PRN
  Administered 2021-06-09: 450 mL via TOPICAL

## 2021-06-09 MED ORDER — MIDAZOLAM HCL 2 MG/2ML IJ SOLN
INTRAMUSCULAR | Status: AC
  Filled 2021-06-09: qty 2

## 2021-06-09 MED ORDER — METHOCARBAMOL 1000 MG/10ML IJ SOLN
500.0000 mg | Freq: Four times a day (QID) | INTRAVENOUS | Status: DC | PRN
  Filled 2021-06-09: qty 5

## 2021-06-09 MED ORDER — CEFAZOLIN SODIUM-DEXTROSE 1-4 GM/50ML-% IV SOLN
1.0000 g | Freq: Three times a day (TID) | INTRAVENOUS | Status: AC
  Administered 2021-06-09 – 2021-06-10 (×2): 1 g via INTRAVENOUS
  Filled 2021-06-09 (×2): qty 50

## 2021-06-09 MED ORDER — ONDANSETRON HCL 4 MG/2ML IJ SOLN: 4.0000 mg | Freq: Four times a day (QID) | INTRAMUSCULAR | Status: DC | PRN

## 2021-06-09 MED ORDER — METOCLOPRAMIDE HCL 5 MG/ML IJ SOLN: 5.0000 mg | Freq: Three times a day (TID) | INTRAMUSCULAR | Status: DC | PRN

## 2021-06-09 MED ORDER — BUPIVACAINE LIPOSOME 1.3 % IJ SUSP
INTRAMUSCULAR | Status: DC | PRN
  Administered 2021-06-09: 20 mL

## 2021-06-09 MED ORDER — PROPOFOL 500 MG/50ML IV EMUL
INTRAVENOUS | Status: DC | PRN
  Administered 2021-06-09: 25 ug/kg/min via INTRAVENOUS

## 2021-06-09 MED ORDER — METHOCARBAMOL 500 MG PO TABS
500.0000 mg | ORAL_TABLET | Freq: Four times a day (QID) | ORAL | Status: DC | PRN
  Administered 2021-06-09 – 2021-06-13 (×5): 500 mg via ORAL
  Filled 2021-06-09 (×6): qty 1

## 2021-06-09 MED ORDER — ARIPIPRAZOLE 10 MG PO TABS
10.0000 mg | ORAL_TABLET | Freq: Every day | ORAL | Status: DC
  Administered 2021-06-10 – 2021-06-15 (×6): 10 mg via ORAL
  Filled 2021-06-09 (×6): qty 1

## 2021-06-09 SURGICAL SUPPLY — 76 items
BAG COUNTER SPONGE SURGICOUNT (BAG) ×2 IMPLANT
BAG DECANTER FOR FLEXI CONT (MISCELLANEOUS) ×2 IMPLANT
BANDAGE ESMARK 6X9 LF (GAUZE/BANDAGES/DRESSINGS) ×1 IMPLANT
BASEPLATE TIBIAL TRIATHALON 3 (Plate) ×2 IMPLANT
BLADE SAW SGTL 11.0X1.19X90.0M (BLADE) ×2 IMPLANT
BNDG COHESIVE 6X5 TAN STRL LF (GAUZE/BANDAGES/DRESSINGS) ×2 IMPLANT
BNDG ELASTIC 4X5.8 VLCR STR LF (GAUZE/BANDAGES/DRESSINGS) ×2 IMPLANT
BNDG ELASTIC 6X10 VLCR STRL LF (GAUZE/BANDAGES/DRESSINGS) ×2 IMPLANT
BNDG ELASTIC 6X15 VLCR STRL LF (GAUZE/BANDAGES/DRESSINGS) ×2 IMPLANT
BNDG ESMARK 6X9 LF (GAUZE/BANDAGES/DRESSINGS) ×2
BOWL SMART MIX CTS (DISPOSABLE) ×2 IMPLANT
CEMENT BONE SIMPLEX SPEEDSET (Cement) ×2 IMPLANT
CLSR STERI-STRIP ANTIMIC 1/2X4 (GAUZE/BANDAGES/DRESSINGS) ×2 IMPLANT
CNTNR URN SCR LID CUP LEK RST (MISCELLANEOUS) ×1 IMPLANT
CONT SPEC 4OZ STRL OR WHT (MISCELLANEOUS) ×2
COVER SURGICAL LIGHT HANDLE (MISCELLANEOUS) ×2 IMPLANT
CUFF TOURN SGL QUICK 34 (TOURNIQUET CUFF) ×2
CUFF TOURN SGL QUICK 42 (TOURNIQUET CUFF) ×2 IMPLANT
CUFF TRNQT CYL 34X4.125X (TOURNIQUET CUFF) ×1 IMPLANT
DECANTER SPIKE VIAL GLASS SM (MISCELLANEOUS) ×2 IMPLANT
DRAPE INCISE IOBAN 66X45 STRL (DRAPES) ×2 IMPLANT
DRAPE ORTHO SPLIT 77X108 STRL (DRAPES) ×6
DRAPE SURG ORHT 6 SPLT 77X108 (DRAPES) ×3 IMPLANT
DRAPE U-SHAPE 47X51 STRL (DRAPES) ×2 IMPLANT
DRSG AQUACEL AG ADV 3.5X14 (GAUZE/BANDAGES/DRESSINGS) ×2 IMPLANT
DURAPREP 26ML APPLICATOR (WOUND CARE) ×4 IMPLANT
ELECT CAUTERY BLADE 6.4 (BLADE) ×2 IMPLANT
ELECT REM PT RETURN 9FT ADLT (ELECTROSURGICAL) ×2
ELECTRODE REM PT RTRN 9FT ADLT (ELECTROSURGICAL) ×1 IMPLANT
GAUZE SPONGE 4X4 12PLY STRL (GAUZE/BANDAGES/DRESSINGS) ×2 IMPLANT
GLOVE SRG 8 PF TXTR STRL LF DI (GLOVE) ×1 IMPLANT
GLOVE SURG LTX SZ7 (GLOVE) ×2 IMPLANT
GLOVE SURG LTX SZ8 (GLOVE) ×2 IMPLANT
GLOVE SURG UNDER POLY LF SZ7 (GLOVE) ×2 IMPLANT
GLOVE SURG UNDER POLY LF SZ8 (GLOVE) ×2
GOWN STRL REUS W/ TWL LRG LVL3 (GOWN DISPOSABLE) ×3 IMPLANT
GOWN STRL REUS W/TWL LRG LVL3 (GOWN DISPOSABLE) ×6
HANDPIECE INTERPULSE COAX TIP (DISPOSABLE) ×2
HOOD PEEL AWAY FLYTE STAYCOOL (MISCELLANEOUS) ×6 IMPLANT
IMMOBILIZER KNEE 20 (SOFTGOODS) ×2
IMMOBILIZER KNEE 20 THIGH 36 (SOFTGOODS) ×1 IMPLANT
IMMOBILIZER KNEE 24 THIGH 36 (MISCELLANEOUS) IMPLANT
IMMOBILIZER KNEE 24 UNIV (MISCELLANEOUS)
INSERT TIB BEARING SZ3 11 (Miscellaneous) ×2 IMPLANT
KIT BASIN OR (CUSTOM PROCEDURE TRAY) ×2 IMPLANT
KIT TURNOVER KIT B (KITS) ×2 IMPLANT
KNEE FEM COMP 2 RT (Joint) ×2 IMPLANT
MANIFOLD NEPTUNE II (INSTRUMENTS) ×2 IMPLANT
NEEDLE 22X1 1/2 (OR ONLY) (NEEDLE) ×4 IMPLANT
NEEDLE SPNL 18GX3.5 QUINCKE PK (NEEDLE) ×2 IMPLANT
NS IRRIG 1000ML POUR BTL (IV SOLUTION) ×4 IMPLANT
PACK TOTAL JOINT (CUSTOM PROCEDURE TRAY) ×2 IMPLANT
PAD ARMBOARD 7.5X6 YLW CONV (MISCELLANEOUS) ×4 IMPLANT
PAD CAST 4YDX4 CTTN HI CHSV (CAST SUPPLIES) ×1 IMPLANT
PADDING CAST COTTON 4X4 STRL (CAST SUPPLIES) ×2
PADDING CAST COTTON 6X4 STRL (CAST SUPPLIES) ×2 IMPLANT
PATELLA 32MMX10MM (Knees) ×2 IMPLANT
PIN FLUTED HEDLESS FIX 3.5X1/8 (PIN) ×2 IMPLANT
SET HNDPC FAN SPRY TIP SCT (DISPOSABLE) ×1 IMPLANT
STRIP CLOSURE SKIN 1/2X4 (GAUZE/BANDAGES/DRESSINGS) ×4 IMPLANT
SUCTION FRAZIER HANDLE 10FR (MISCELLANEOUS) ×2
SUCTION TUBE FRAZIER 10FR DISP (MISCELLANEOUS) ×1 IMPLANT
SUT MNCRL AB 3-0 PS2 18 (SUTURE) ×2 IMPLANT
SUT VIC AB 0 CT1 27 (SUTURE) ×6
SUT VIC AB 0 CT1 27XBRD ANBCTR (SUTURE) ×3 IMPLANT
SUT VIC AB 1 CT1 27 (SUTURE) ×10
SUT VIC AB 1 CT1 27XBRD ANBCTR (SUTURE) ×5 IMPLANT
SUT VIC AB 2-0 CT1 27 (SUTURE) ×8
SUT VIC AB 2-0 CT1 TAPERPNT 27 (SUTURE) ×4 IMPLANT
SYR 30ML LL (SYRINGE) ×8 IMPLANT
SYR TB 1ML LUER SLIP (SYRINGE) ×2 IMPLANT
TOWEL GREEN STERILE (TOWEL DISPOSABLE) ×4 IMPLANT
TOWEL GREEN STERILE FF (TOWEL DISPOSABLE) ×4 IMPLANT
TRAY CATH 16FR W/PLASTIC CATH (SET/KITS/TRAYS/PACK) ×2 IMPLANT
WATER STERILE IRR 1000ML POUR (IV SOLUTION) ×2 IMPLANT
YANKAUER SUCT BULB TIP NO VENT (SUCTIONS) ×2 IMPLANT

## 2021-06-09 NOTE — Brief Op Note (Signed)
   06/09/2021  10:38 AM  PATIENT:  [name]  75 y.o. female  PRE-OPERATIVE DIAGNOSIS:  right knee osteoarthritis  POST-OPERATIVE DIAGNOSIS:  right knee osteoarthritis  PROCEDURE:  Procedure(s): RIGHT TOTAL KNEE ARTHROPLASTY  SURGEON:  Surgeon(s): Meredith Pel, MD  ASSISTANT: magnant pa  ANESTHESIA:   spinal  EBL: 50 ml    Total I/O In: 1600 [I.V.:800; IV Piggyback:800] Out: 950 [Urine:900; Blood:50]  BLOOD ADMINISTERED: none  DRAINS: none   LOCAL MEDICATIONS USED: Marcaine morphine clonidine Exparel vancomycin powder  SPECIMEN:  No Specimen  COUNTS:  YES  TOURNIQUET:   Total Tourniquet Time Documented: Thigh (Right) - 95 minutes Total: Thigh (Right) - 95 minutes   DICTATION: .Other Dictation: Dictation Number [id number] PLAN OF CARE: Admit to inpatient   PATIENT DISPOSITION:  PACU - hemodynamically stable

## 2021-06-09 NOTE — Op Note (Signed)
[name], [name] MEDICAL RECORD NO: [id number] ACCOUNT NO: [id number] DATE OF BIRTH: February 01, 1946 FACILITY: [facility] LOCATION: [facility] PHYSICIAN: Tiffany Barre. Marlou Sa, MD  Operative Report   DATE OF PROCEDURE: 06/09/2021  PREOPERATIVE DIAGNOSIS:  Right knee osteoarthritis.  POSTOPERATIVE DIAGNOSIS:  Right knee osteoarthritis.  PROCEDURE:  Right total knee replacement using Stryker cemented posterior cruciate retaining Triathlon components including cemented tibial baseplate, size 3, asymmetric patella size 32.  An 11 mm cruciate deep dish bearing and cruciate retaining femoral  component, size 2.  SURGEON:  Tiffany Barre. Marlou Sa, MD  ASSISTANT:  Tiffany Main, PA. and [name], medical student.  INDICATIONS:  The patient is a 75 year old patient with end-stage right knee arthritis, who presents for operative management after explanation of risks and benefits and failure of conservative management.  DESCRIPTION OF PROCEDURE:  The patient was brought to the operating room where spinal anesthetic was induced.  Preoperative antibiotics were administered.  Timeout was called.  Right leg was prescrubbed with alcohol and Betadine.  Allowed to air dry,  prepped with DuraPrep solution and draped in a sterile manner.  The patient had a preoperative valgus alignment, which was partially correctable.  Right leg was then prepped with DuraPrep solution.  Ioban used to cover the operative field.  A timeout was  called.  Leg was elevated and exsanguinated with Esmarch wrap.  Tourniquet was inflated to 300 mmHg.  Anterior approach to knee was made.  Skin and subcutaneous tissue were sharply divided.  The IrriSept solution used after the incision as well as after  the arthrotomy.  Median parapatellar approach was made, marked with #1 Vicryl suture.  Patella was everted.  The lateral patellofemoral ligament was released.  Soft tissue removed from the anterior distal femur.  There was capsular release including   partial release of the iliotibial band performed on the lateral aspect of the tibial plateau after partial excision of the fat pad.  Osteophytes were removed.  ACL was removed.  Minimal soft tissue dissection was performed on the medial side.  The  patient had significant wear in the patellofemoral compartment and on the lateral side.  A 2 mm cut under the most affected lateral tibial plateau was made using intramedullary alignment with a posterior neurovascular structures and collaterals  protected.  This was later revised 2 more millimeters.  Initial 8 mm cut in 4 degrees of valgus on the distal femur was made and later revised 2 more millimeters.  This gave a symmetric extension gap of 9-11 mm in extension.  Next, the femur was sized to  a size 2.  Tibia was sized to a size 3.  Anterior, posterior and chamfer cuts were made on the femur.  Symmetric flexion gap maintained with that cut.  Next, the tibia was keel punched and the femoral trial was placed.  The patient had full extension  and good patellar tracking with both the 9 mm and 11 mm baseplate.  Patella was then cut down from 22-12 mm and a 3-peg trial patella was placed.  With trial components in position an 11 mm spacer in place, the patient had good stability to varus and  valgus stress at 0, 30 and 90 degrees.  Patella tracked beautifully.  Trial components were removed.  Thorough irrigation was performed.  The IrriSept solution utilized as well.  A solution of Marcaine, Exparel and saline injected into the capsule.  TXA  sponge allowed to sit for 3 minutes and within the incision.  Then this was  removed.  Bone plugs as well as the vancomycin powder placed in the femur and tibia.  Components were cemented into position with 11 mm spacer with same stability parameters  maintained.  Excess cement was removed.  Thorough irrigation was then performed.  Tourniquet was released, bleeding points encountered cauterized with electrocautery, pouring  irrigation utilized at this time.  Median parapatellar approach was closed over  bolster using #1 Vicryl suture with Marcaine, morphine, clonidine injected into the arthrotomy prior to full closure along with vancomycin powder and the IrriSept solution.  Next, the closure was achieved using 0 Vicryl suture, 2-0 Vicryl suture, and  3-0 Monocryl with Steri-Strips.  Aquacel dressing applied.  Bulky dressing and a knee immobilizer were placed.  The patient tolerated the procedure well without immediate complication.  [name]'s assistance was required at all times for retraction, opening,  closing, mobilization of tissue.  His assistance was a medical necessity.     Elián.Darby D: 06/09/2021 10:44:02 am T: 06/09/2021 11:14:00 am  JOB: [id number]

## 2021-06-09 NOTE — Anesthesia Procedure Notes (Signed)
Anesthesia Regional Block: Adductor canal block   Pre-Anesthetic Checklist: , timeout performed,  Correct Patient, Correct Site, Correct Laterality,  Correct Procedure, Correct Position, site marked,  Risks and benefits discussed,  Surgical consent,  Pre-op evaluation,  At surgeon's request and post-op pain management  Laterality: Right  Prep: chloraprep       Needles:  Injection technique: Single-shot  Needle Type: Echogenic Needle     Needle Length: 9cm      Additional Needles:   Procedures:,,,, ultrasound used (permanent image in chart),,    Narrative:  Start time: 06/09/2021 7:02 AM End time: 06/09/2021 7:09 AM Injection made incrementally with aspirations every 5 mL.  Performed by: Personally  Anesthesiologist: Myrtie Soman, MD  Additional Notes: Patient tolerated the procedure well without complications

## 2021-06-09 NOTE — Anesthesia Preprocedure Evaluation (Signed)
Anesthesia Evaluation  Patient identified by MRN, date of birth, ID band Patient awake    Reviewed: Allergy & Precautions, NPO status , Patient's Chart, lab work & pertinent test results  Airway Mallampati: II  TM Distance: >3 FB Neck ROM: Full    Dental no notable dental hx.    Pulmonary neg pulmonary ROS, former smoker,    Pulmonary exam normal breath sounds clear to auscultation       Cardiovascular negative cardio ROS Normal cardiovascular exam Rhythm:Regular Rate:Normal     Neuro/Psych Anxiety negative neurological ROS     GI/Hepatic negative GI ROS, Neg liver ROS,   Endo/Other  negative endocrine ROS  Renal/GU negative Renal ROS  negative genitourinary   Musculoskeletal  (+) Arthritis , Osteoarthritis,    Abdominal   Peds negative pediatric ROS (+)  Hematology negative hematology ROS (+)   Anesthesia Other Findings   Reproductive/Obstetrics negative OB ROS                             Anesthesia Physical Anesthesia Plan  ASA: 2  Anesthesia Plan: Spinal   Post-op Pain Management:  Regional for Post-op pain   Induction: Intravenous  PONV Risk Score and Plan: 2 and Ondansetron, Dexamethasone and Treatment may vary due to age or medical condition  Airway Management Planned: Simple Face Mask  Additional Equipment:   Intra-op Plan:   Post-operative Plan:   Informed Consent: I have reviewed the patients History and Physical, chart, labs and discussed the procedure including the risks, benefits and alternatives for the proposed anesthesia with the patient or authorized representative who has indicated his/her understanding and acceptance.     Dental advisory given  Plan Discussed with: CRNA and Surgeon  Anesthesia Plan Comments:         Anesthesia Quick Evaluation

## 2021-06-09 NOTE — Progress Notes (Signed)
Physical Therapy Evaluation Patient Details Name: [name]: [id number] DOB: 26-Jun-1946 Today's Date: 06/09/2021   History of Present Illness  Pt is a 75yo female s/p RTKA on 8/9. PMH: lumbar radiculopathy, depression, history of anal cancer.  Clinical Impression  Pt presents s/p surgery above with deficits below. Pt expressed high level of pain during session which limited mobility. Required min guard assist for all mobility tasks and was able to ambulate to chair using RW. Educated patient on the importance of sitting upright, incentive spirometry, performance of ankle pumps, and knee precautions. Pt lives alone and does not have 24/7 help upon discharge. Pt reports she plans to go to SNF at discharge. We will continue to follow her acutely to promote independence with functional mobility.    Follow Up Recommendations Follow surgeon's recommendation for DC plan and follow-up therapies (Pt reports she plans to go to SNF)    Equipment Recommendations  3in1 (PT) (pt has RW)    Recommendations for Other Services       Precautions / Restrictions Precautions Precautions: Fall;Knee Precaution Booklet Issued: No Precaution Comments: Reviewed knee precautions with patient Restrictions Weight Bearing Restrictions: Yes RLE Weight Bearing: Weight bearing as tolerated      Mobility  Bed Mobility Overal bed mobility: Needs Assistance Bed Mobility: Supine to Sit     Supine to sit: Min guard     General bed mobility comments: Pt min guard for safety    Transfers Overall transfer level: Needs assistance Equipment used: Rolling walker (2 wheeled) Transfers: Sit to/from Stand Sit to Stand: Min guard         General transfer comment: Pt min guard for safety; multimodal cues for hand placement during transfer.  Ambulation/Gait Ambulation/Gait assistance: Min guard Gait Distance (Feet): 2 Feet Assistive device: Rolling walker (2 wheeled) Gait Pattern/deviations: Step-to  pattern;Step-through pattern;Decreased step length - right;Antalgic;Decreased weight shift to right Gait velocity: decreased   General Gait Details: Slow, antalgic gait with limited weightshift to RLE. Heavy reliance on UE support.  Pt demonstrating step-to pattern progressing to step-through.  Cues for sequencing using RW.  Stairs            Wheelchair Mobility    Modified Rankin (Stroke Patients Only)       Balance Overall balance assessment: Needs assistance Sitting-balance support: Feet supported Sitting balance-Leahy Scale: Fair     Standing balance support: Bilateral upper extremity supported;During functional activity Standing balance-Leahy Scale: Poor Standing balance comment: pt reliant on BUE on RW during functional mobility tasks                             Pertinent Vitals/Pain Pain Assessment: 0-10 Pain Score: 9  Pain Location: right knee, left knee also painful 6/10 described as "throbbing" Pain Descriptors / Indicators: Operative site guarding;Cramping;Throbbing Pain Intervention(s): Patient requesting pain meds-RN notified;Repositioned;Monitored during session    Home Living Family/patient expects to be discharged to:: Skilled nursing facility Living Arrangements: Alone Available Help at Discharge: Other (Comment) (none) Type of Home: House Home Access: Stairs to enter Entrance Stairs-Rails: Right Entrance Stairs-Number of Steps: 1 Home Layout: One level Home Equipment: Walker - 2 wheels;Shower seat Additional Comments: Pt reports she plans to go to SNF as she lives alone.    Prior Function Level of Independence: Independent with assistive device(s)         Comments: Pt used RW at home, quit working in March     Hand  Dominance        Extremity/Trunk Assessment   Upper Extremity Assessment Upper Extremity Assessment: Overall WFL for tasks assessed    Lower Extremity Assessment Lower Extremity Assessment: RLE  deficits/detail;LLE deficits/detail RLE Deficits / Details: Reduced ROM and operative site guarding consistent with R TKA. During functional mobility noted ~80deg R knee flexion per visual approximation. RLE Sensation: WNL LLE Deficits / Details: Pt reported pain in left knee starting approximately 1 week prior to surgery. LLE Sensation: WNL    Cervical / Trunk Assessment Cervical / Trunk Assessment: Normal  Communication   Communication: No difficulties  Cognition Arousal/Alertness: Awake/alert Behavior During Therapy: WFL for tasks assessed/performed Overall Cognitive Status: Within Functional Limits for tasks assessed                                        General Comments General comments (skin integrity, edema, etc.): Pt educated on incentive spirometry and importance of ankle pumps.    Exercises Total Joint Exercises Ankle Circles/Pumps: AROM;Right;10 reps;Seated   Assessment/Plan    PT Assessment Patient needs continued PT services  PT Problem List Decreased strength;Decreased range of motion;Decreased activity tolerance;Decreased balance;Decreased mobility;Decreased knowledge of use of DME;Decreased safety awareness;Pain       PT Treatment Interventions DME instruction;Gait training;Stair training;Functional mobility training;Therapeutic activities;Therapeutic exercise;Balance training;Patient/family education    PT Goals (Current goals can be found in the Care Plan section)  Acute Rehab PT Goals Patient Stated Goal: to get home PT Goal Formulation: With patient Time For Goal Achievement: 06/23/21 Potential to Achieve Goals: Good    Frequency 7X/week   Barriers to discharge Decreased caregiver support      Co-evaluation               AM-PAC PT "6 Clicks" Mobility  Outcome Measure Help needed turning from your back to your side while in a flat bed without using bedrails?: A Little Help needed moving from lying on your back to sitting on  the side of a flat bed without using bedrails?: A Little Help needed moving to and from a bed to a chair (including a wheelchair)?: A Little Help needed standing up from a chair using your arms (e.g., wheelchair or bedside chair)?: A Little Help needed to walk in hospital room?: A Little Help needed climbing 3-5 steps with a railing? : A Lot 6 Click Score: 17    End of Session Equipment Utilized During Treatment: Gait belt Activity Tolerance: Patient limited by pain Patient left: in bed;with call bell/phone within reach Nurse Communication: Mobility status;Patient requests pain meds PT Visit Diagnosis: Pain;Muscle weakness (generalized) (M62.81);Difficulty in walking, not elsewhere classified (R26.2) Pain - Right/Left: Right Pain - part of body: Knee    Time: [id number] PT Time Calculation (min) (ACUTE ONLY): 25 min   Charges:   PT Evaluation $PT Eval Low Complexity: 1 Low PT Treatments $Therapeutic Activity: 8-22 mins        Dawayne Cirri, SPT  Dawayne Cirri 06/09/2021, 5:09 PM

## 2021-06-09 NOTE — Progress Notes (Signed)
Orthopedic Tech Progress Note Patient Details:  [name] 1946-07-18 [id number]  CPM Right Knee CPM Right Knee: On Right Knee Flexion (Degrees): 10 Right Knee Extension (Degrees): 40  Post Interventions Patient Tolerated: Well Instructions Provided: [facility] of [place] 06/09/2021, 11:49 AM

## 2021-06-09 NOTE — TOC Progression Note (Signed)
Transition of Care [facility]) - Progression Note    Patient Details  Name: MEY SULAK MRN: [id number] Date of Birth: 1946/09/08  Transition of Care [facility]) CM/SW Contact  Milinda Antis, LCSWA Phone Number: 06/09/2021, 4:23 PM  Clinical Narrative:     TOC following patient for any d/c planning needs once medically stable.  Lind Covert, MSW, LCSWA        Expected Discharge Plan and Services                                                 Social Determinants of Health (SDOH) Interventions    Readmission Risk Interventions No flowsheet data found.

## 2021-06-09 NOTE — Anesthesia Postprocedure Evaluation (Signed)
Anesthesia Post Note  [name]  Procedure(s) Performed: RIGHT TOTAL KNEE ARTHROPLASTY (Right: Knee)     Patient location during evaluation: PACU Anesthesia Type: Spinal Level of consciousness: oriented and awake and alert Pain management: pain level controlled Vital Signs Assessment: post-procedure vital signs reviewed and stable Respiratory status: spontaneous breathing, respiratory function stable and patient connected to nasal cannula oxygen Cardiovascular status: blood pressure returned to baseline and stable Postop Assessment: no headache, no backache and no apparent nausea or vomiting Anesthetic complications: no   No notable events documented.  Last Vitals:  Vitals:   06/09/21 1134 06/09/21 1155  BP:  (!) 102/42  Pulse: (!) 56 (!) 52  Resp: 10 11  Temp:  (!) 36.2 C  SpO2: 100% 95%    Last Pain:  Vitals:   06/09/21 1155  TempSrc:   PainSc: Asleep                 Lyall Faciane S

## 2021-06-09 NOTE — Anesthesia Procedure Notes (Signed)
Spinal  Patient location during procedure: OR Start time: 06/09/2021 7:32 AM End time: 06/09/2021 7:36 AM Reason for block: surgical anesthesia Staffing Performed: anesthesiologist  Anesthesiologist: Myrtie Soman, MD Preanesthetic Checklist Completed: patient identified, IV checked, site marked, risks and benefits discussed, surgical consent, monitors and equipment checked, pre-op evaluation and timeout performed Spinal Block Patient position: sitting Prep: Betadine Patient monitoring: heart rate, continuous pulse ox and blood pressure Approach: midline Location: L3-4 Injection technique: single-shot Needle Needle type: Sprotte  Needle gauge: 24 G Needle length: 9 cm Assessment Events: CSF return Additional Notes

## 2021-06-09 NOTE — Anesthesia Procedure Notes (Signed)
Procedure Name: MAC Date/Time: 06/09/2021 7:29 AM Performed by: Michele Rockers, CRNA Pre-anesthesia Checklist: Patient identified, Emergency Drugs available, Suction available, Timeout performed and Patient being monitored Patient Re-evaluated:Patient Re-evaluated prior to induction Oxygen Delivery Method: Simple face mask

## 2021-06-09 NOTE — Anesthesia Procedure Notes (Signed)
Anesthesia Procedure Image    

## 2021-06-09 NOTE — H&P (Signed)
TOTAL KNEE ADMISSION H&P  Patient is being admitted for right total knee arthroplasty.  Subjective:  Chief Complaint:right knee pain.  HPI: [name], 76 y.o. female, has a history of pain and functional disability in the right knee due to arthritis and has failed non-surgical conservative treatments for greater than 12 weeks to includeNSAID's and/or analgesics, corticosteriod injections, flexibility and strengthening excercises, and activity modification.  Onset of symptoms was gradual, starting 7 years ago with gradually worsening course since that time. The patient noted no past surgery on the right knee(s).  Patient currently rates pain in the right knee(s) at 9 out of 10 with activity. Patient has night pain, worsening of pain with activity and weight bearing, pain that interferes with activities of daily living, pain with passive range of motion, crepitus, and joint swelling.  Patient has evidence of subchondral sclerosis and joint space narrowing by imaging studies. This patient has had  A long history of knee pain which has not been relieved with any conservative measures.  No personal or family history of DVT or pulmonary embolism. . There is no active infection.  Patient Active Problem List   Diagnosis Date Noted   Fever    Staphylococcus aureus bacteremia with sepsis ([place])    SIRS (systemic inflammatory response syndrome) ([place]) 11/30/2020   Lumbar radiculopathy 10/10/2019   Bilateral stenosis of lateral recess of lumbar spine 10/10/2019   Hemorrhoids, external without complications 0000000   Depression    Anxiety    Rectal bleeding    History of radiation therapy    Malignant neoplasm of anal canal ([place]) 11/15/2011   Hemorrhoids, internal, with bleeding 09/07/2011   Acquired anal stenosis 09/07/2011   Hyperlipidemia 09/07/2011   Family history of breast cancer in sister 09/07/2011   Past Medical History:  Diagnosis Date   anal ca dx'd 11/2011   squamous cell  carcinoma   Anxiety    Arthritis 2022   Depression    Full dentures    Hemorrhoids    external   History of radiation therapy 11/29/11 to 01/05/12   anal canal 5040 cGy 28 sessions, regional lymph nodes 4200 cGy 28 sessions   Hyperlipidemia    Rectal bleeding    Vitamin D deficiency    Wears glasses     Past Surgical History:  Procedure Laterality Date   ABDOMINAL HYSTERECTOMY  1978   age 13   BUBBLE STUDY  12/04/2020   Procedure: BUBBLE STUDY;  Surgeon: [name], MD;  Location: [place];  Service: Cardiology;;   EXAMINATION UNDER ANESTHESIA  06/21/2012   Procedure: EXAM UNDER ANESTHESIA;  Surgeon: [name], MD;  Location: [place];  Service: General;  Laterality: N/A;  rectal exam under anesthesia, anal dilation, rectal biopsy   [place]   Patient had to guess on the date.   RECTAL BIOPSY  06/21/2012   Procedure: BIOPSY RECTAL;  Surgeon: [name], MD;  Location: [place];  Service: General;  Laterality: N/A;   TEE WITHOUT CARDIOVERSION N/A 12/04/2020   Procedure: TRANSESOPHAGEAL ECHOCARDIOGRAM (TEE);  Surgeon: [name], MD;  Location: [place];  Service: Cardiology;  Laterality: N/A;   TUBAL LIGATION     b/l  age 49    Current Facility-Administered Medications  Medication Dose Route Frequency Provider Last Rate Last Admin   ceFAZolin (ANCEF) IVPB 2g/100 mL premix  2 g Intravenous On Call to OR Magnant, Gerrianne Scale, PA-C  chlorhexidine (PERIDEX) 0.12 % solution 15 mL  15 mL Mouth/Throat Once Myrtie Soman, MD       Or   MEDLINE mouth rinse  15 mL Mouth Rinse Once Myrtie Soman, MD       chlorhexidine (PERIDEX) 0.12 % solution            lactated ringers infusion   Intravenous Continuous Myrtie Soman, MD       povidone-iodine (BETADINE) 7.5 % scrub   Topical Once Magnant, Charles L, PA-C       povidone-iodine 10 % swab 2 application  2 application Topical Once Magnant, Charles L, PA-C        povidone-iodine 10 % swab 2 application  2 application Topical Once Magnant, Charles L, PA-C       tranexamic acid (CYKLOKAPRON) 2,000 mg in sodium chloride 0.9 % 50 mL Topical Application  123XX123 mg Topical To OR Marlou Sa, Tonna Corner, MD       tranexamic acid (CYKLOKAPRON) IVPB 1,000 mg  1,000 mg Intravenous To OR Magnant, Charles L, PA-C       No Known Allergies  Social History   Tobacco Use   Smoking status: Former    Packs/day: 0.50    Years: 25.00    Pack years: 12.50    Types: Cigarettes    Quit date: 09/07/1987    Years since quitting: 33.7   Smokeless tobacco: Never  Substance Use Topics   Alcohol use: No    Family History  Problem Relation Age of Onset   Cancer Sister        breast/ age 102   Cervical cancer Sister        25   Alcohol abuse Maternal Uncle      Review of Systems  Objective:  Physical Exam  Vital signs in last 24 hours: Temp:  [97.9 F (36.6 C)] 97.9 F (36.6 C) (08/09 0601) Pulse Rate:  [65] 65 (08/09 0601) Resp:  [17] 17 (08/09 0601) BP: (140)/(59) 140/59 (08/09 0601) SpO2:  [97 %] 97 % (08/09 0601) Weight:  [76.7 kg] 76.7 kg (08/09 0601)  Labs:   Estimated body mass index is 30.91 kg/m as calculated from the following:   Height as of this encounter: '5\' 2"'$  (1.575 m).   Weight as of this encounter: 76.7 kg.   Imaging Review Plain radiographs demonstrate severe degenerative joint disease of the right knee(s). The overall alignment ismild varus. The bone quality appears to be good for age and reported activity level.      Assessment/Plan:  End stage arthritis, right knee   The patient history, physical examination, clinical judgment of the provider and imaging studies are consistent with end stage degenerative joint disease of the right knee(s) and total knee arthroplasty is deemed medically necessary. The treatment options including medical management, injection therapy arthroscopy and arthroplasty were discussed at length. The risks  and benefits of total knee arthroplasty were presented and reviewed. The risks due to aseptic loosening, infection, stiffness, patella tracking problems, thromboembolic complications and other imponderables were discussed. The patient acknowledged the explanation, agreed to proceed with the plan and consent was signed. Patient is being admitted for inpatient treatment for surgery, pain control, PT, OT, prophylactic antibiotics, VTE prophylaxis, progressive ambulation and ADL's and discharge planning. The patient is planning to be discharged to skilled nursing facility    Anticipated LOS equal to or greater than 2 midnights due to - Age 63 and older with one or more of the following:  -  Obesity  - Expected need for hospital services (PT, OT, Nursing) required for safe  discharge  - Anticipated need for postoperative skilled nursing care or inpatient rehab  - Active co-morbidities:   OR   - Unanticipated findings during/Post Surgery:     - Patient is a high risk of re-admission due to: None

## 2021-06-09 NOTE — Transfer of Care (Signed)
Immediate Anesthesia Transfer of Care Note  [name]  Procedure(s) Performed: RIGHT TOTAL KNEE ARTHROPLASTY (Right: Knee)  Patient Location: PACU  Anesthesia Type:Spinal  Level of Consciousness: drowsy and patient cooperative  Airway & Oxygen Therapy: Patient Spontanous Breathing and Patient connected to nasal cannula oxygen  Post-op Assessment: Report given to RN and Post -op Vital signs reviewed and stable  Post vital signs: Reviewed and stable  Last Vitals:  Vitals Value Taken Time  BP 106/49 06/09/21 1040  Temp 36.9 C 06/09/21 1040  Pulse 59 06/09/21 1048  Resp 13 06/09/21 1048  SpO2 100 % 06/09/21 1048  Vitals shown include unvalidated device data.  Last Pain:  Vitals:   06/09/21 1040  TempSrc:   PainSc: Asleep         Complications: No notable events documented.

## 2021-06-10 ENCOUNTER — Encounter (HOSPITAL_COMMUNITY): Payer: Self-pay | Admitting: Orthopedic Surgery

## 2021-06-10 MED ORDER — HYDROCODONE-ACETAMINOPHEN 5-325 MG PO TABS
1.0000 | ORAL_TABLET | ORAL | Status: DC | PRN
  Administered 2021-06-13: 2 via ORAL
  Administered 2021-06-13 (×2): 1 via ORAL
  Filled 2021-06-10 (×2): qty 2
  Filled 2021-06-10: qty 1
  Filled 2021-06-10: qty 2

## 2021-06-10 MED ORDER — CHLORHEXIDINE GLUCONATE CLOTH 2 % EX PADS
6.0000 | MEDICATED_PAD | Freq: Every day | CUTANEOUS | Status: DC
  Administered 2021-06-10 – 2021-06-15 (×6): 6 via TOPICAL

## 2021-06-10 NOTE — Progress Notes (Signed)
Physical Therapy Treatment Patient Details Name: [name]: [id number] DOB: [birth year] Today's Date: 06/10/2021    History of Present Illness Pt is a 75 y.o. female s/p R TKA on 06/09/21. PMH includes lumbar radiculopathy, depression, anal CA.   PT Comments    Pt progressing with mobility. Today's session focused on transfer and gait training with RW. Pt remains limited by generalized weakness, R knee pain, decreased activity tolerance and impaired balance strategies, requiring intermittent assist to stand and maintain balance. Pt also limited by fatigue/lethargy and apparent cognitive impairment (suspect related to pain meds/anesthesia), including decreased memory, impaired attention and poor problem solving. Continue to recommend SNF-level therapies to maximize functional mobility and independence prior to return home.  SpO2 83-89% on RA SpO2 93-96% on 2L    Follow Up Recommendations  Follow surgeon's recommendation for DC plan and follow-up therapies;SNF;Supervision for mobility/OOB     Equipment Recommendations  3in1 (PT)    Recommendations for Other Services       Precautions / Restrictions Precautions Precautions: Fall;Knee Precaution Comments: Reviewed knee precautions with patient Restrictions Weight Bearing Restrictions: Yes RLE Weight Bearing: Weight bearing as tolerated    Mobility  Bed Mobility Overal bed mobility: Needs Assistance Bed Mobility: Supine to Sit     Supine to sit: Min assist     General bed mobility comments: MinA for RLE management to EOB, increased time and effort requiring cues to complete task    Transfers Overall transfer level: Needs assistance Equipment used: Rolling walker (2 wheeled) Transfers: Sit to/from Stand Sit to Stand: Min assist;Min guard         General transfer comment: Initial minA for trunk elevation standing from EOB to RW; multiple additional sit<>stands from recliner to RW with close min guard, repeated  cues for sequencing and hand placement  Ambulation/Gait Ambulation/Gait assistance: Min guard;Min assist Gait Distance (Feet): 4 Feet (+ 20' + 6') Assistive device: Rolling walker (2 wheeled) Gait Pattern/deviations: Step-through pattern;Decreased stride length;Decreased weight shift to right;Trunk flexed;Antalgic Gait velocity: Decreased Gait velocity interpretation: <1.31 ft/sec, indicative of household ambulator General Gait Details: Slow, antalgic gait with RW and min guard for balance; improving step length with cues, noted decreased R knee extension and decreased WBAT through RLE; required 3x seated rest break secondary to fatigue and pain; 1x knee instability requiring minA to prevent LOB   Stairs             Wheelchair Mobility    Modified Rankin (Stroke Patients Only)       Balance Overall balance assessment: Needs assistance Sitting-balance support: Feet supported Sitting balance-Leahy Scale: Fair     Standing balance support: Bilateral upper extremity supported;During functional activity Standing balance-Leahy Scale: Poor Standing balance comment: Reliant on BUE support                            Cognition Arousal/Alertness: Lethargic;Awake/alert Behavior During Therapy: Flat affect;WFL for tasks assessed/performed Overall Cognitive Status: No family/caregiver present to determine baseline cognitive functioning Area of Impairment: Attention;Memory;Following commands;Safety/judgement;Awareness;Problem solving                   Current Attention Level: Sustained;Selective Memory: Decreased short-term memory;Decreased recall of precautions Following Commands: Follows one step commands with increased time Safety/Judgement: Decreased awareness of safety;Decreased awareness of deficits Awareness: Emergent Problem Solving: Decreased initiation;Difficulty sequencing;Requires verbal cues General Comments: Pt keeping eyes closed and apparently  falling asleep at times, attributes to fatigue; suspect may  be related to prolonged effects of anesthesia and/or pain medication. Following simple commands appropriately, but requiring frequent verbal cues/reminders for safe technique and sequencing. Very pleasant and agreeable. Reports she does not recall yesterday's PT sessions      Exercises Total Joint Exercises Long Arc Quad: AROM;AAROM;Right;Seated (AAROM for extension stretch) Knee Flexion: AROM;AAROM;Right;Seated (AAROM for flexion stretch) Other Exercises Other Exercises: Incentive spirometer x10 (cues for technique) - pt pulling ~1000 mL    General Comments General comments (skin integrity, edema, etc.): Pt received on RA with SpO2 83-89%; 2L O2 replaced with SpO2 93-96% (RN notified)      Pertinent Vitals/Pain Pain Assessment: Faces Faces Pain Scale: Hurts even more Pain Location: R knee Pain Descriptors / Indicators: Operative site guarding;Grimacing;Sore Pain Intervention(s): Monitored during session;Repositioned    Home Living                      Prior Function            PT Goals (current goals can now be found in the care plan section) Progress towards PT goals: Progressing toward goals    Frequency    7X/week      PT Plan Current plan remains appropriate    Co-evaluation              AM-PAC PT "6 Clicks" Mobility   Outcome Measure  Help needed turning from your back to your side while in a flat bed without using bedrails?: A Little Help needed moving from lying on your back to sitting on the side of a flat bed without using bedrails?: A Little Help needed moving to and from a bed to a chair (including a wheelchair)?: A Little Help needed standing up from a chair using your arms (e.g., wheelchair or bedside chair)?: A Little Help needed to walk in hospital room?: A Little Help needed climbing 3-5 steps with a railing? : A Lot 6 Click Score: 17    End of Session Equipment Utilized  During Treatment: Gait belt Activity Tolerance: Patient tolerated treatment well Patient left: in chair;with call bell/phone within reach;with chair alarm set Nurse Communication: Mobility status PT Visit Diagnosis: Pain;Muscle weakness (generalized) (M62.81);Difficulty in walking, not elsewhere classified (R26.2) Pain - Right/Left: Right Pain - part of body: Knee     Time: [id number] PT Time Calculation (min) (ACUTE ONLY): 34 min  Charges:  $Gait Training: 8-22 mins $Therapeutic Activity: 8-22 mins                     Mabeline Caras, PT, DPT Acute Rehabilitation Services  Pager [phone] Office [place] 06/10/2021, 5:31 PM

## 2021-06-10 NOTE — Plan of Care (Signed)
  Problem: Education: Goal: Knowledge of General Education information will improve Description: Including pain rating scale, medication(s)/side effects and non-pharmacologic comfort measures Outcome: Progressing   Problem: Activity: Goal: Risk for activity intolerance will decrease Outcome: Progressing   Problem: Pain Managment: Goal: General experience of comfort will improve 06/10/2021 0225 by Claire Shown, RN Outcome: Progressing 06/10/2021 0224 by Claire Shown, RN Outcome: Progressing   Problem: Safety: Goal: Ability to remain free from injury will improve 06/10/2021 0225 by Claire Shown, RN Outcome: Progressing 06/10/2021 0224 by Claire Shown, RN Outcome: Progressing   Problem: Skin Integrity: Goal: Risk for impaired skin integrity will decrease 06/10/2021 0225 by Claire Shown, RN Outcome: Progressing 06/10/2021 0224 by Claire Shown, RN Outcome: Progressing

## 2021-06-10 NOTE — Progress Notes (Signed)
  Subjective: [name] is a 75 y.o. female s/p right TKA.  They are POD 1.  Pt's pain is controlled.  Pt denies numbness/tingling/weakness.  Pt has ambulated with great difficulty.  She was able to ambulate 2 feet in PT yesterday.  She is oriented but somnolent.  She is easily arousable.  No complaint of chest pain, shortness of breath, headache, calf pain.  She has not had a bowel movement yet.  Objective: Vital signs in last 24 hours: Temp:  [97.2 F (36.2 C)-99 F (37.2 C)] 98.1 F (36.7 C) (08/10 0749) Pulse Rate:  [52-85] 83 (08/10 0749) Resp:  [10-29] 17 (08/10 0749) BP: (97-132)/(42-61) 132/61 (08/10 0749) SpO2:  [92 %-100 %] 93 % (08/10 0749) FiO2 (%):  [21 %] 21 % (08/09 1322)  Intake/Output from previous day: 08/09 0701 - 08/10 0700 In: 1840 [P.O.:240; I.V.:800; IV Piggyback:800] Out: 1250 [Urine:1200; Blood:50] Intake/Output this shift: No intake/output data recorded.  Exam:  No gross blood or drainage overlying the dressing 1+ DP pulse Sensation intact distally in the right foot Able to dorsiflex and plantarflex the right foot No calf tenderness   Labs: No results for input(s): HGB in the last 72 hours. No results for input(s): WBC, RBC, HCT, PLT in the last 72 hours. No results for input(s): NA, K, CL, CO2, BUN, CREATININE, GLUCOSE, CALCIUM in the last 72 hours. No results for input(s): LABPT, INR in the last 72 hours.  Assessment/Plan: Pt is POD 1 s/p right TKA.    -Plan to discharge to SNF today or tomorrow pending patient's pain and PT eval  -WBAT with a walker  -Change medication from oxycodone to hydrocodone with patient sedation today.  Vitals are stable and patient has no other complaints aside from knee pain so no significant concern especially with her history of having medications hit her fairly hard.  Discussed with nurse and she has been this way since her procedure yesterday.     Jyl Chico L [place]men [place]ray 06/10/2021, 7:56 AM

## 2021-06-11 ENCOUNTER — Telehealth: Payer: Self-pay

## 2021-06-11 ENCOUNTER — Encounter (HOSPITAL_COMMUNITY): Payer: Self-pay | Admitting: Orthopedic Surgery

## 2021-06-11 DIAGNOSIS — M1711 Unilateral primary osteoarthritis, right knee: Secondary | ICD-10-CM | POA: Diagnosis present

## 2021-06-11 DIAGNOSIS — F32A Depression, unspecified: Secondary | ICD-10-CM | POA: Diagnosis present

## 2021-06-11 DIAGNOSIS — E785 Hyperlipidemia, unspecified: Secondary | ICD-10-CM | POA: Diagnosis present

## 2021-06-11 DIAGNOSIS — Z20822 Contact with and (suspected) exposure to covid-19: Secondary | ICD-10-CM | POA: Diagnosis present

## 2021-06-11 DIAGNOSIS — Z923 Personal history of irradiation: Secondary | ICD-10-CM | POA: Diagnosis not present

## 2021-06-11 DIAGNOSIS — F419 Anxiety disorder, unspecified: Secondary | ICD-10-CM | POA: Diagnosis present

## 2021-06-11 DIAGNOSIS — Z85048 Personal history of other malignant neoplasm of rectum, rectosigmoid junction, and anus: Secondary | ICD-10-CM | POA: Diagnosis not present

## 2021-06-11 DIAGNOSIS — F05 Delirium due to known physiological condition: Secondary | ICD-10-CM | POA: Diagnosis not present

## 2021-06-11 DIAGNOSIS — R400 Somnolence: Secondary | ICD-10-CM | POA: Diagnosis not present

## 2021-06-11 DIAGNOSIS — Z803 Family history of malignant neoplasm of breast: Secondary | ICD-10-CM | POA: Diagnosis not present

## 2021-06-11 NOTE — Progress Notes (Signed)
Patient is alert and oriented. Confusion periodically. Bed alarm was on. Noted pt walked to sink using rolling walker and bed alarm did not go off. Patient states she turned off bed alarm . Educated pt to call for assistance to get out of bed, to prevent her from possible fall due to post surgery. Assisted pt to chair and chair alarm on. Will continue to monitor closely.

## 2021-06-11 NOTE — Progress Notes (Addendum)
Physical Therapy Treatment Patient Details Name: [name]: [id number] DOB: Mar 05, 1946 Today's Date: 06/11/2021    History of Present Illness Pt is a 75 y.o. female s/p R TKA on 06/09/21. PMH includes lumbar radiculopathy, depression, anal CA.    PT Comments    Pt supine in bed on arrival this session.  Performed gt progression and HEP.  Pt is progressing slowly and continues to benefit from rehab in a post acute setting.      Follow Up Recommendations  Follow surgeon's recommendation for DC plan and follow-up therapies;SNF;Supervision for mobility/OOB     Equipment Recommendations  3in1 (PT)    Recommendations for Other Services       Precautions / Restrictions Precautions Precautions: Fall;Knee Precaution Booklet Issued: No Precaution Comments: Reviewed knee precautions with patient Restrictions Weight Bearing Restrictions: Yes RLE Weight Bearing: Weight bearing as tolerated    Mobility  Bed Mobility Overal bed mobility: Needs Assistance Bed Mobility: Supine to Sit;Sit to Supine     Supine to sit: Min assist Sit to supine: Supervision   General bed mobility comments: MinA for RLE management to EOB, increased time and effort requiring cues to complete task.  Used gt belt to move RLE back to bed.    Transfers Overall transfer level: Needs assistance Equipment used: Rolling walker (2 wheeled) Transfers: Sit to/from Stand Sit to Stand: Min guard         General transfer comment: Cues for hand placement to and from seated surface.  Ambulation/Gait Ambulation/Gait assistance: Min assist Gait Distance (Feet): 35 Feet Assistive device: Rolling walker (2 wheeled) Gait Pattern/deviations: Step-through pattern;Decreased stride length;Decreased weight shift to right;Trunk flexed;Antalgic Gait velocity: Decreased   General Gait Details: Pt able to progress to step through pattern this session and increase gt distance.  No LOB noted.  Pt does however fatigue  quickly.   Stairs             Wheelchair Mobility    Modified Rankin (Stroke Patients Only)       Balance Overall balance assessment: Needs assistance   Sitting balance-Leahy Scale: Fair       Standing balance-Leahy Scale: Poor                              Cognition Arousal/Alertness: Lethargic;Awake/alert Behavior During Therapy: Flat affect;WFL for tasks assessed/performed Overall Cognitive Status: No family/caregiver present to determine baseline cognitive functioning Area of Impairment: Problem solving;Safety/judgement                       Following Commands: Follows one step commands with increased time Safety/Judgement: Decreased awareness of safety;Decreased awareness of deficits     General Comments: Pt more alert this session and did fairly well with some continued safety and education deficits. She was lying in bed with knees bent on matress.      Exercises Total Joint Exercises Ankle Circles/Pumps: AROM;Both;20 reps;Supine Quad Sets: AROM;Right;10 reps;Supine Heel Slides: AAROM;Right;10 reps;Supine Hip ABduction/ADduction: AAROM;Right;10 reps;Supine Straight Leg Raises: AAROM;Right;10 reps;Supine;Limitations Straight Leg Raises Limitations: extensor lag Goniometric ROM: 8-85 R knee AROM.    General Comments        Pertinent Vitals/Pain Pain Assessment: Faces Faces Pain Scale: Hurts even more Pain Location: R knee Pain Descriptors / Indicators: Operative site guarding;Grimacing;Sore Pain Intervention(s): Monitored during session;Repositioned    Home Living  Prior Function            PT Goals (current goals can now be found in the care plan section) Acute Rehab PT Goals Patient Stated Goal: to get home Potential to Achieve Goals: Good Progress towards PT goals: Progressing toward goals    Frequency    7X/week      PT Plan Current plan remains appropriate     Co-evaluation              AM-PAC PT "6 Clicks" Mobility   Outcome Measure  Help needed turning from your back to your side while in a flat bed without using bedrails?: A Little Help needed moving from lying on your back to sitting on the side of a flat bed without using bedrails?: A Little Help needed moving to and from a bed to a chair (including a wheelchair)?: A Little Help needed standing up from a chair using your arms (e.g., wheelchair or bedside chair)?: A Little Help needed to walk in hospital room?: A Little Help needed climbing 3-5 steps with a railing? : A Lot 6 Click Score: 17    End of Session Equipment Utilized During Treatment: Gait belt Activity Tolerance: Patient tolerated treatment well Patient left: in bed;with bed alarm set;with call bell/phone within reach Nurse Communication: Mobility status PT Visit Diagnosis: Pain;Muscle weakness (generalized) (M62.81);Difficulty in walking, not elsewhere classified (R26.2) Pain - Right/Left: Right Pain - part of body: Knee     Time: [id number] PT Time Calculation (min) (ACUTE ONLY): 24 min  Charges:  $Gait Training: 8-22 mins $Therapeutic Exercise: 8-22 mins                     Erasmo Leventhal , PTA Acute Rehabilitation Services Pager [phone] Office [phone]    Neddie Steedman Eli Hose 06/11/2021, 2:17 PM

## 2021-06-11 NOTE — Progress Notes (Signed)
Patient stable.  Vitals look good.  She is much less somnolent than she was yesterday and much more alert.  She did have episode of confusion earlier this morning where she pulled her IV out which sounded like sundowning based on the description.  Currently she is alert and oriented and reports pain is well controlled.  She is mobilizing with physical therapy and continually improving with gait distance of 35 feet today.  No sign of DVT with patient declining any chest pain, shortness of breath, calf pain.  No calf tenderness on exam.  Incision looks to be intact with no drainage or blood on the dressing.  Plan for discharge to skilled nursing facility in the coming days.

## 2021-06-11 NOTE — Progress Notes (Signed)
Using chair alarm in bed so pt not able to turn offf.

## 2021-06-11 NOTE — Plan of Care (Signed)
?  Problem: Activity: ?Goal: Risk for activity intolerance will decrease ?Outcome: Progressing ?  ?Problem: Safety: ?Goal: Ability to remain free from injury will improve ?Outcome: Progressing ?  ?Problem: Pain Managment: ?Goal: General experience of comfort will improve ?Outcome: Progressing ?  ?

## 2021-06-11 NOTE — TOC Progression Note (Signed)
Transition of Care [facility]) - Progression Note    Patient Details  Name: [name]: [id number] Date of Birth: 06-22-1946  Transition of Care [facility]) CM/SW Contact  Milinda Antis, [place] Phone Number: 06/11/2021, 3:12 PM  Clinical Narrative:     RE: [name]  Date of Birth: 1946/08/22 Date: 06/11/2021  Please be advised that the above-named patient will require a short-term nursing home stay - anticipated 30 days or less for rehabilitation and strengthening.  The plan is for return home.        Expected Discharge Plan and Services                                                 Social Determinants of Health (SDOH) Interventions    Readmission Risk Interventions No flowsheet data found.

## 2021-06-11 NOTE — Telephone Encounter (Signed)
Katharine Look bleu at the hospital called and stated she needs an order for inpatient placed in the pt's chart.  Please advise Her CB # [phone]

## 2021-06-11 NOTE — Plan of Care (Signed)

## 2021-06-11 NOTE — NC FL2 (Signed)
[place] LEVEL OF CARE SCREENING TOOL     IDENTIFICATION  Patient Name: [name] Birthdate: 1945-12-15 Sex: female Admission Date (Current Location): 06/09/2021  [facility] and [place] Number:  Herbalist and Address:  [facility], [place] [address] [place], [place] [address]      Provider Number: [id number]  Attending Physician Name and Address:  Meredith Pel, MD  Relative Name and Phone Number:  Carilyn Goodpasture (Daughter)   [phone]    Current Level of Care: Hospital Recommended Level of Care: [place] Prior Approval Number:    Date Approved/Denied:   PASRR Number:    Discharge Plan:      Current Diagnoses: Patient Active Problem List   Diagnosis Date Noted   S/P total knee arthroplasty, right 06/09/2021   Fever    Staphylococcus aureus bacteremia with sepsis ([place])    SIRS (systemic inflammatory response syndrome) ([place]) 11/30/2020   Lumbar radiculopathy 10/10/2019   Bilateral stenosis of lateral recess of lumbar spine 10/10/2019   Hemorrhoids, external without complications 0000000   Depression    Anxiety    Rectal bleeding    History of radiation therapy    Malignant neoplasm of anal canal ([place]) 11/15/2011   Hemorrhoids, internal, with bleeding 09/07/2011   Acquired anal stenosis 09/07/2011   Hyperlipidemia 09/07/2011   Family history of breast cancer in sister 09/07/2011    Orientation RESPIRATION BLADDER Height & Weight     Self, Time, Situation, Place  Normal Continent Weight: 169 lb (76.7 kg) Height:  '5\' 2"'$  (157.5 cm)  BEHAVIORAL SYMPTOMS/MOOD NEUROLOGICAL BOWEL NUTRITION STATUS      Continent Diet (see d/c summary)  AMBULATORY STATUS COMMUNICATION OF NEEDS Skin   Limited Assist Verbally Surgical wounds                       Personal Care Assistance Level of Assistance  Bathing, Dressing, Feeding Bathing Assistance: Limited assistance Feeding assistance:  Independent Dressing Assistance: Limited assistance     Functional Limitations Info  Sight, Hearing, Speech Sight Info: Adequate Hearing Info: Adequate Speech Info: Adequate    SPECIAL CARE FACTORS FREQUENCY                       Contractures Contractures Info: Not present    Additional Factors Info  Allergies, Code Status Code Status Info: Full Allergies Info: NKA           Current Medications (06/11/2021):  This is the current hospital active medication list Current Facility-Administered Medications  Medication Dose Route Frequency Provider Last Rate Last Admin   acetaminophen (TYLENOL) tablet 325-650 mg  325-650 mg Oral Q6H PRN Magnant, Charles L, PA-C   650 mg at 06/11/21 [id number]   ARIPiprazole (ABILIFY) tablet 10 mg  10 mg Oral Daily Magnant, Charles L, PA-C   10 mg at 06/11/21 [id number]   aspirin chewable tablet 81 mg  81 mg Oral BID Magnant, Charles L, PA-C   81 mg at 06/11/21 [id number]   celecoxib (CELEBREX) capsule 100 mg  100 mg Oral BID Magnant, Charles L, PA-C   100 mg at 06/11/21 [id number]   Chlorhexidine Gluconate Cloth 2 % PADS 6 each  6 each Topical Daily Meredith Pel, MD   6 each at 06/11/21 0920   docusate sodium (COLACE) capsule 100 mg  100 mg Oral BID Magnant, Charles L, PA-C   100 mg at 06/11/21 [phone]  DULoxetine (CYMBALTA) DR capsule 120 mg  120 mg Oral Daily Magnant, Charles L, PA-C   120 mg at 06/11/21 [id number]   gabapentin (NEURONTIN) capsule 600 mg  600 mg Oral BID Meredith Pel, MD   600 mg at 06/11/21 [id number]   HYDROcodone-acetaminophen (NORCO/VICODIN) 5-325 MG per tablet 1-2 tablet  1-2 tablet Oral Q4H PRN Magnant, Charles L, PA-C       HYDROmorphone (DILAUDID) injection 0.5 mg  0.5 mg Intravenous Q4H PRN Magnant, Charles L, PA-C   0.5 mg at 06/11/21 [id number]   lactated ringers infusion   Intravenous Continuous Donella Stade, PA-C 75 mL/hr at 06/09/21 2147 New Bag at 06/09/21 2147   levothyroxine (SYNTHROID) tablet 25 mcg  25 mcg Oral QAC breakfast  Magnant, Charles L, PA-C   25 mcg at 06/11/21 0746   menthol-cetylpyridinium (CEPACOL) lozenge 3 mg  1 lozenge Oral PRN Magnant, Charles L, PA-C       Or   phenol (CHLORASEPTIC) mouth spray 1 spray  1 spray Mouth/Throat PRN Magnant, Charles L, PA-C       methocarbamol (ROBAXIN) tablet 500 mg  500 mg Oral Q6H PRN Magnant, Charles L, PA-C   500 mg at 06/10/21 2208   Or   methocarbamol (ROBAXIN) 500 mg in dextrose 5 % 50 mL IVPB  500 mg Intravenous Q6H PRN Magnant, Charles L, PA-C       metoCLOPramide (REGLAN) tablet 5-10 mg  5-10 mg Oral Q8H PRN Magnant, Charles L, PA-C       Or   metoCLOPramide (REGLAN) injection 5-10 mg  5-10 mg Intravenous Q8H PRN Magnant, Charles L, PA-C       ondansetron (ZOFRAN) tablet 4 mg  4 mg Oral Q6H PRN Magnant, Charles L, PA-C       Or   ondansetron (ZOFRAN) injection 4 mg  4 mg Intravenous Q6H PRN Magnant, Gerrianne Scale, PA-C         Discharge Medications: Please see discharge summary for a list of discharge medications.  Relevant Imaging Results:  Relevant Lab Results:   Additional Information SS#[id number];  [place] COVID-19 Vaccine 01/02/2020 , 12/06/2019  Milinda Antis, LCSWA

## 2021-06-11 NOTE — TOC Initial Note (Signed)
Transition of Care [facility]) - Initial/Assessment Note    Patient Details  Name: [name]: [id number] Date of Birth: May 07, 1946  Transition of Care [facility]) CM/SW Contact:    Milinda Antis, [place] Phone Number: 06/11/2021, 3:21 PM  Clinical Narrative:                 CSW received consult for possible SNF placement at time of discharge. CSW spoke with patient.  Patient expressed understanding of PT recommendation and is agreeable to SNF placement at time of discharge. Patient reports preference for [facility]. CSW discussed insurance authorization process and provided Medicare SNF ratings list. Patient has received the COVID vaccines. Patient expressed being hopeful for rehab and to feel better soon. No further questions reported at this time.   Skilled Nursing Rehab Facilities-   [email or web address] *Ratings updated quarterly   Ratings out of 5 possible   Name Address  Phone # [place] Inspection Overall  [facility] [address], [place] '5  4 4  '$ [facility]  [address] [place], [place] [phone] '4 2 4 4  '$ [facility] [place], [place] '2 3 1 1  '$ [place], [place] '3 3 4 4  '$ [facility] [address], [place] '3  2 2  '$ [place] '2 2 4 4  '$ [facility] [address] [place] '5 2 2 3  '$ [facility] [address] [place] '4 2 2 2  '$ [place] [facility] [place], [place] [phone] '5 2 2 3  '$ [facility] [phone], [place] [phone] '5 1 2 2  '$ [facility] [address], [facility] [phone] '5 2 2 3  '$ [place] [phone] '3 1 1 1     '$ Expected Discharge Plan: Skilled Nursing Facility Barriers to  Discharge: Insurance Authorization, SNF Pending bed offer   Patient Goals and CMS Choice Patient states their goals for this hospitalization and ongoing recovery are:: To got to rehab and get better CMS Medicare.gov Compare Post Acute Care list provided to:: Patient Choice offered to / list presented to : Patient  Expected Discharge Plan and Services Expected Discharge Plan: [place]                                              Prior Living Arrangements/Services     Patient language and need for interpreter reviewed:: Yes Do you feel safe going back to the place where you live?: Yes      Need for Family Participation in Patient Care: Yes (Comment) Care giver support system in place?: Yes (comment)   Criminal Activity/Legal Involvement Pertinent to Current Situation/Hospitalization: No - Comment as needed  Activities of Daily Living Home Assistive Devices/Equipment: Dentures (specify type), Eyeglasses, Walker (specify type), Shower chair without back ADL Screening (condition at time of admission) Patient's cognitive ability adequate to safely complete daily activities?: Yes Is the patient deaf or have difficulty hearing?: No Does the patient have difficulty seeing, even when wearing glasses/contacts?: No Does the patient have difficulty concentrating, remembering, or making decisions?: No Patient able to express need for assistance with ADLs?: No Does the patient have difficulty dressing or bathing?: No Independently performs  ADLs?: Yes (appropriate for developmental age) Does the patient have difficulty walking or climbing stairs?: Yes Weakness of Legs: Right Weakness of Arms/Hands: None  Permission Sought/Granted   Permission granted to share information with : Yes, Verbal Permission Granted     Permission granted to share info w AGENCY: SNF        Emotional Assessment Appearance:: Appears older than stated age Attitude/Demeanor/Rapport:  Engaged Affect (typically observed): Appropriate Orientation: : Oriented to Self, Oriented to Place, Oriented to  Time, Oriented to Situation Alcohol / Substance Use: Not Applicable Psych Involvement: No (comment)  Admission diagnosis:  S/P total knee arthroplasty, right [id number] Patient Active Problem List   Diagnosis Date Noted   S/P total knee arthroplasty, right 06/09/2021   Fever    Staphylococcus aureus bacteremia with sepsis ([place])    SIRS (systemic inflammatory response syndrome) ([place]) 11/30/2020   Lumbar radiculopathy 10/10/2019   Bilateral stenosis of lateral recess of lumbar spine 10/10/2019   Hemorrhoids, external without complications 0000000   Depression    Anxiety    Rectal bleeding    History of radiation therapy    Malignant neoplasm of anal canal ([place]) 11/15/2011   Hemorrhoids, internal, with bleeding 09/07/2011   Acquired anal stenosis 09/07/2011   Hyperlipidemia 09/07/2011   Family history of breast cancer in sister 09/07/2011   PCP:  [name], MD Pharmacy:   [place], [place] [address] [facility] [place] [address] Phone: [phone] Fax: [phone]     Social Determinants of Health (SDOH) Interventions    Readmission Risk Interventions No flowsheet data found.

## 2021-06-12 NOTE — Plan of Care (Signed)

## 2021-06-12 NOTE — Progress Notes (Signed)
Physical Therapy Treatment Patient Details Name: [name]: [id number] DOB: December 05, 1945 Today's Date: 06/12/2021    History of Present Illness Pt is a 75 y.o. female s/p R TKA on 06/09/21. PMH includes lumbar radiculopathy, depression, anal CA.    PT Comments    Pt supine in bed on arrival.  Pt eager to mobilize this pm.  Performed supine exercises and able to progress gt.  Pt is very impulsive with minor safety concerns and continues to present with R quad weakness.  Pt lives home alone and remains an excellent candidate for post acute rehab to return home independently.     Follow Up Recommendations  Follow surgeon's recommendation for DC plan and follow-up therapies;SNF;Supervision for mobility/OOB     Equipment Recommendations  3in1 (PT)    Recommendations for Other Services       Precautions / Restrictions Precautions Precautions: Fall;Knee Precaution Booklet Issued: No Precaution Comments: Reviewed knee precautions with patient Restrictions Weight Bearing Restrictions: Yes RLE Weight Bearing: Weight bearing as tolerated    Mobility  Bed Mobility Overal bed mobility: Needs Assistance Bed Mobility: Supine to Sit;Sit to Supine     Supine to sit: Supervision     General bed mobility comments: For safety.    Transfers Overall transfer level: Needs assistance Equipment used: Rolling walker (2 wheeled) Transfers: Sit to/from Stand Sit to Stand: Supervision         General transfer comment: Pt impulsive to rise into standing.  Supervision for safety.  Ambulation/Gait Ambulation/Gait assistance: Min guard Gait Distance (Feet): 100 Feet Assistive device: Rolling walker (2 wheeled) Gait Pattern/deviations: Step-through pattern;Decreased stride length;Decreased weight shift to right;Trunk flexed;Antalgic Gait velocity: Decreased   General Gait Details: Pt motivated to progress distance.  She still continues to fatigue with activity.   Stairs              Wheelchair Mobility    Modified Rankin (Stroke Patients Only)       Balance Overall balance assessment: Needs assistance Sitting-balance support: Feet supported Sitting balance-Leahy Scale: Fair       Standing balance-Leahy Scale: Poor                              Cognition Arousal/Alertness: Awake/alert Behavior During Therapy: Impulsive Overall Cognitive Status: No family/caregiver present to determine baseline cognitive functioning                                 General Comments: Pt very impulsive and eager this session.      Exercises Total Joint Exercises Ankle Circles/Pumps: AROM;Both;20 reps;Supine Quad Sets: AROM;Right;10 reps;Supine Heel Slides: Right;10 reps;Supine;AROM Hip ABduction/ADduction: AAROM;Right;10 reps;Supine;AROM Straight Leg Raises: AAROM;Right;10 reps;Supine;Limitations Straight Leg Raises Limitations: extensor lag Goniometric ROM: 8-95    General Comments        Pertinent Vitals/Pain Pain Assessment: Faces Faces Pain Scale: Hurts even more Pain Location: R knee Pain Descriptors / Indicators: Operative site guarding;Grimacing;Sore Pain Intervention(s): Monitored during session;Repositioned    Home Living                      Prior Function            PT Goals (current goals can now be found in the care plan section) Acute Rehab PT Goals Patient Stated Goal: to get home Potential to Achieve Goals: Good Progress towards PT goals: Progressing  toward goals    Frequency    7X/week      PT Plan Current plan remains appropriate    Co-evaluation              AM-PAC PT "6 Clicks" Mobility   Outcome Measure  Help needed turning from your back to your side while in a flat bed without using bedrails?: A Little Help needed moving from lying on your back to sitting on the side of a flat bed without using bedrails?: A Little Help needed moving to and from a bed to a chair  (including a wheelchair)?: A Little Help needed standing up from a chair using your arms (e.g., wheelchair or bedside chair)?: A Little Help needed to walk in hospital room?: A Little Help needed climbing 3-5 steps with a railing? : A Lot 6 Click Score: 17    End of Session Equipment Utilized During Treatment: Gait belt Activity Tolerance: Patient tolerated treatment well Patient left: in bed;with bed alarm set;with call bell/phone within reach Nurse Communication: Mobility status PT Visit Diagnosis: Pain;Muscle weakness (generalized) (M62.81);Difficulty in walking, not elsewhere classified (R26.2) Pain - Right/Left: Right Pain - part of body: Knee     Time: 1456-1510 PT Time Calculation (min) (ACUTE ONLY): 14 min  Charges:  $Gait Training: 8-22 mins                     Erasmo Leventhal , PTA Acute Rehabilitation Services Pager [phone] Office [phone]    Jack Bolio Eli Hose 06/12/2021, 3:24 PM

## 2021-06-12 NOTE — Telephone Encounter (Signed)
Done thx

## 2021-06-12 NOTE — Progress Notes (Signed)
Patient stable, vital stable.  Patient is alert and oriented.  Spoke with RN who reported episode of confusion last night once again where she did not know where she was and pulled her IV out.  She does have increased confusion and sedation with opioid medications so plan to discontinue IV Dilaudid and only take hydrocodone as needed.  Overall her pain is very well controlled and she seems to be progressing well with physical therapy.  Denies any chest pain, shortness of breath, calf pain.  Dressing looks intact without any gross blood or drainage.  She does extend to about 10 to 15 degrees and flex to 90 degrees.  No calf tenderness.  Plan for discharge to skilled nursing facility in coming days.  Please feel free to message me if there are any barriers to discharge or concerns.

## 2021-06-13 MED ORDER — ASPIRIN 81 MG PO CHEW: 81.0000 mg | CHEWABLE_TABLET | Freq: Two times a day (BID) | ORAL | 0 refills | Status: DC

## 2021-06-13 MED ORDER — HYDROCODONE-ACETAMINOPHEN 5-325 MG PO TABS: 1.0000 | ORAL_TABLET | ORAL | 0 refills | Status: DC | PRN

## 2021-06-13 MED ORDER — CELECOXIB 100 MG PO CAPS: 100.0000 mg | ORAL_CAPSULE | Freq: Every morning | ORAL | 0 refills | Status: DC

## 2021-06-13 MED ORDER — METHOCARBAMOL 500 MG PO TABS: 500.0000 mg | ORAL_TABLET | Freq: Four times a day (QID) | ORAL | 0 refills | Status: DC | PRN

## 2021-06-13 NOTE — Progress Notes (Signed)
Physical Therapy Treatment Patient Details Name: [name]: [id number] DOB: 1945/12/25 Today's Date: 06/13/2021    History of Present Illness Pt is a 75 y.o. female s/p R TKA on 06/09/21. PMH includes lumbar radiculopathy, depression, anal CA.    PT Comments    Pt seated in recliner and eager to ambulate.  She is performing mobility with decreased assistance.  She is still having minor safety concerns and required cues to correct.      Follow Up Recommendations  Follow surgeon's recommendation for DC plan and follow-up therapies;SNF;Supervision for mobility/OOB     Equipment Recommendations  3in1 (PT)    Recommendations for Other Services       Precautions / Restrictions Precautions Precautions: Fall;Knee Precaution Booklet Issued: No Precaution Comments: Reviewed knee precautions with patient Restrictions Weight Bearing Restrictions: Yes RLE Weight Bearing: Weight bearing as tolerated    Mobility  Bed Mobility               General bed mobility comments: Seated in recliner on arrival.    Transfers Overall transfer level: Needs assistance Equipment used: Rolling walker (2 wheeled) Transfers: Sit to/from Stand Sit to Stand: Supervision         General transfer comment: Pt impulsive to rise into standing.  Supervision for safety.  Ambulation/Gait Ambulation/Gait assistance: Supervision;Min guard Gait Distance (Feet): 150 Feet Assistive device: Rolling walker (2 wheeled) Gait Pattern/deviations: Step-through pattern;Decreased stride length;Decreased weight shift to right;Trunk flexed;Antalgic Gait velocity: Decreased   General Gait Details: Pt less fatigue with gt speed improving.  Cues for negotiating obstacles in tight spaces to maintain safety.   Stairs             Wheelchair Mobility    Modified Rankin (Stroke Patients Only)       Balance Overall balance assessment: Needs assistance Sitting-balance support: Feet  supported Sitting balance-Leahy Scale: Fair       Standing balance-Leahy Scale: Poor                              Cognition Arousal/Alertness: Awake/alert Behavior During Therapy: Impulsive Overall Cognitive Status: No family/caregiver present to determine baseline cognitive functioning Area of Impairment: Problem solving;Safety/judgement                   Current Attention Level: Sustained;Selective Memory: Decreased short-term memory;Decreased recall of precautions Following Commands: Follows one step commands with increased time Safety/Judgement: Decreased awareness of safety;Decreased awareness of deficits Awareness: Emergent Problem Solving: Decreased initiation;Difficulty sequencing;Requires verbal cues General Comments: Pt very impulsive and eager this session.      Exercises Total Joint Exercises Ankle Circles/Pumps: AROM;Both;20 reps;Supine Quad Sets: AROM;Right;10 reps;Supine Heel Slides: Right;10 reps;Supine;AROM Hip ABduction/ADduction: AAROM;Right;10 reps;Supine;AROM Straight Leg Raises: AAROM;Right;10 reps;Supine;Limitations Straight Leg Raises Limitations: extensor lag Long Arc Quad: AROM;AAROM;Right;Seated Goniometric ROM: 6-98 R knee AROM    General Comments        Pertinent Vitals/Pain Pain Assessment: Faces Faces Pain Scale: Hurts little more Pain Location: R knee Pain Descriptors / Indicators: Operative site guarding;Grimacing;Sore Pain Intervention(s): Monitored during session;Repositioned    Home Living                      Prior Function            PT Goals (current goals can now be found in the care plan section) Acute Rehab PT Goals Patient Stated Goal: to get home Potential to Achieve Goals:  Good Progress towards PT goals: Progressing toward goals    Frequency    7X/week      PT Plan Current plan remains appropriate    Co-evaluation              AM-PAC PT "6 Clicks" Mobility   Outcome  Measure  Help needed turning from your back to your side while in a flat bed without using bedrails?: A Little Help needed moving from lying on your back to sitting on the side of a flat bed without using bedrails?: A Little Help needed moving to and from a bed to a chair (including a wheelchair)?: A Little Help needed standing up from a chair using your arms (e.g., wheelchair or bedside chair)?: A Little Help needed to walk in hospital room?: A Little Help needed climbing 3-5 steps with a railing? : A Lot 6 Click Score: 17    End of Session Equipment Utilized During Treatment: Gait belt Activity Tolerance: Patient tolerated treatment well Patient left: in bed;with bed alarm set;with call bell/phone within reach Nurse Communication: Mobility status PT Visit Diagnosis: Pain;Muscle weakness (generalized) (M62.81);Difficulty in walking, not elsewhere classified (R26.2) Pain - Right/Left: Right Pain - part of body: Knee     Time: [id number] PT Time Calculation (min) (ACUTE ONLY): 15 min  Charges:  $Gait Training: 8-22 mins                     Erasmo Leventhal , PTA Acute Rehabilitation Services Pager [phone] Office [phone]    Clyda Smyth Eli Hose 06/13/2021, 1:01 PM

## 2021-06-13 NOTE — Plan of Care (Signed)
  Problem: Education: Goal: Knowledge of General Education information will improve Description: Including pain rating scale, medication(s)/side effects and non-pharmacologic comfort measures Outcome: Progressing   Problem: Clinical Measurements: Goal: Will remain free from infection Outcome: Progressing Goal: Respiratory complications will improve Outcome: Progressing   

## 2021-06-13 NOTE — Progress Notes (Signed)
  Subjective: Patient stable.  Walking in the halls very well.  Pain is very well controlled.   Objective: Vital signs in last 24 hours: Temp:  [98.1 F (36.7 C)-98.4 F (36.9 C)] 98.3 F (36.8 C) (08/13 0634) Pulse Rate:  [75-85] 77 (08/13 0634) Resp:  [17-19] 19 (08/13 0634) BP: (122-147)/(55-67) 147/67 (08/13 0634) SpO2:  [100 %] 100 % (08/13 0634)  Intake/Output from previous day: No intake/output data recorded. Intake/Output this shift: No intake/output data recorded.  Exam:  Sensation intact distally Intact pulses distally Dorsiflexion/Plantar flexion intact  Labs: No results for input(s): HGB in the last 72 hours. No results for input(s): WBC, RBC, HCT, PLT in the last 72 hours. No results for input(s): NA, K, CL, CO2, BUN, CREATININE, GLUCOSE, CALCIUM in the last 72 hours. No results for input(s): LABPT, INR in the last 72 hours.  Assessment/Plan: Plan at this time is discharge to skilled nursing per physical therapy recommendation.  Overall she is walking well with a walker and has excellent range of motion.  Pain controlled and swelling is fairly minimal.  Patient prefers [facility] for skilled nursing.   [name] Sujey Gundry 06/13/2021, 7:29 AM

## 2021-06-14 MED ORDER — COVID-19 MRNA VACC (MODERNA) 50 MCG/0.25ML IM SUSP
0.2500 mL | Freq: Once | INTRAMUSCULAR | Status: AC
  Administered 2021-06-14: 0.25 mL via INTRAMUSCULAR
  Filled 2021-06-14: qty 0.25

## 2021-06-14 NOTE — Progress Notes (Signed)
Patient stable.  Vital signs are stable.  She is doing very well with physical therapy, patient discussed with PT.  She does seem functional enough that she could go home but discussed him but discussed with patient's daughter but discussed with patient's daughter and patient's daughter is not able to come regularly to check on her so skilled nursing facility is the safest option.  Anticipate a 1 week stay at the skilled nursing facility at max.  On exam she has postop dressing intact without gross blood or drainage.  Right foot is warm and well-perfused.  No calf tenderness.  COVID test was swabbed today and COVID booster administered.  She will be discharged to [facility] after COVID test comes back.  Denies any chest pain, shortness of breath, calf pain

## 2021-06-14 NOTE — Discharge Summary (Addendum)
Physician Discharge Summary      Patient ID: [name]: [id number] DOB/AGE: 10-Jun-1946 75 y.o.  Admit date: 06/09/2021 Discharge date: 06/15/21  Admission Diagnoses:  Active Problems:   S/P total knee arthroplasty, right   Discharge Diagnoses:  Same  Surgeries: Procedure(s): RIGHT TOTAL KNEE ARTHROPLASTY on 06/09/2021   Consultants:   Discharged Condition: Stable  Hospital Course: [name] is an 75 y.o. female who was admitted 06/09/2021 with a chief complaint of right knee pain, and found to have a diagnosis of right knee osteoarthritis.  They were brought to the operating room on 06/09/2021 and underwent the above named procedures.  Pt awoke from anesthesia without complication and was transferred to the floor. On POD1, patient had great difficulty with ambulation and was somewhat somnolent but oriented.  She returned to her baseline cognition by POD2 with occasional episodes of likely sundowning at night.  She improved her mobility significantly to the point where home discharge was considered if she had any other help at home but she lives alone. She waas discharged to SNF on POD5.  She had no complaints of chest pain, shortness of breath, calf pain throughout her stay.  Pt will f/u with Dr. Marlou Sa in clinic in ~2 weeks.   Antibiotics given:  Anti-infectives (From admission, onward)    Start     Dose/Rate Route Frequency Ordered Stop   06/09/21 1530  ceFAZolin (ANCEF) IVPB 1 g/50 mL premix        1 g 100 mL/hr over 30 Minutes Intravenous Every 8 hours 06/09/21 1106 06/10/21 0037   06/09/21 0907  vancomycin (VANCOCIN) powder  Status:  Discontinued          As needed 06/09/21 0907 06/09/21 1044   06/09/21 0615  ceFAZolin (ANCEF) IVPB 2g/100 mL premix        2 g 200 mL/hr over 30 Minutes Intravenous On call to O.R. 06/09/21 0603 06/09/21 0800     .  Recent vital signs:  Vitals:   06/13/21 2100 06/14/21 0757  BP: (!) 117/55 137/67  Pulse: 74 79  Resp: 16 16   Temp: 97.7 F (36.5 C) 98.9 F (37.2 C)  SpO2: 99% 94%    Recent laboratory studies:  Results for orders placed or performed in visit on 06/08/21  SARS Coronavirus 2 (TAT 6-24 hrs)  Result Value Ref Range   SARS Coronavirus 2 RESULT: NEGATIVE     Discharge Medications:   Allergies as of 06/14/2021   No Known Allergies      Medication List     STOP taking these medications    amLODipine 5 MG tablet Commonly known as: NORVASC   FISH OIL PO   meloxicam 15 MG tablet Commonly known as: MOBIC   naproxen sodium 220 MG tablet Commonly known as: ALEVE   polyethylene glycol 17 g packet Commonly known as: MIRALAX / GLYCOLAX       TAKE these medications    acetaminophen 650 MG CR tablet Commonly known as: TYLENOL Take 650 mg by mouth every 8 (eight) hours as needed for pain.   ARIPiprazole 10 MG tablet Commonly known as: ABILIFY Take 10 mg by mouth in the morning.   aspirin 81 MG chewable tablet Chew 1 tablet (81 mg total) by mouth 2 (two) times daily.   CALCIUM 600+D3 PO Take 1 tablet by mouth in the morning.   celecoxib 100 MG capsule Commonly known as: CeleBREX Take 1 capsule (100 mg total) by mouth in the  morning.   cholecalciferol 25 MCG (1000 UNIT) tablet Commonly known as: VITAMIN D Take 1,000 Units by mouth in the morning.   DULoxetine 60 MG capsule Commonly known as: CYMBALTA Take 120 mg by mouth in the morning.   gabapentin 600 MG tablet Commonly known as: NEURONTIN Take 600 mg by mouth 2 (two) times daily.   HYDROcodone-acetaminophen 5-325 MG tablet Commonly known as: NORCO/VICODIN Take 1-2 tablets by mouth every 4 (four) hours as needed for severe pain.   levothyroxine 25 MCG tablet Commonly known as: SYNTHROID Take 25 mcg by mouth daily before breakfast.   methocarbamol 500 MG tablet Commonly known as: ROBAXIN Take 1 tablet (500 mg total) by mouth every 6 (six) hours as needed for muscle spasms.   multivitamin with minerals Tabs  tablet Take 1 tablet by mouth in the morning. Centrum Silver for Women   pravastatin 40 MG tablet Commonly known as: PRAVACHOL Take 40 mg by mouth daily with breakfast.   traZODone 100 MG tablet Commonly known as: DESYREL Take 100 mg by mouth at bedtime.   vitamin B-12 500 MCG tablet Commonly known as: CYANOCOBALAMIN Take 500 mcg by mouth in the morning.               Durable Medical Equipment  (From admission, onward)           Start     Ordered   06/10/21 1305  For home use only DME 3 n 1  Once        06/10/21 1304            Diagnostic Studies: No results found.  Disposition: Discharge disposition: 03-Skilled Nursing Facility       Discharge Instructions     Call MD / Call 911   Complete by: As directed    If you experience chest pain or shortness of breath, CALL 911 and be transported to the hospital emergency room.  If you develope a fever above 101 F, pus (white drainage) or increased drainage or redness at the wound, or calf pain, call your surgeon's office.   Constipation Prevention   Complete by: As directed    Drink plenty of fluids.  Prune juice may be helpful.  You may use a stool softener, such as Colace (over the counter) 100 mg twice a day.  Use MiraLax (over the counter) for constipation as needed.   Diet - low sodium heart healthy   Complete by: As directed    Increase activity slowly as tolerated   Complete by: As directed    Post-operative opioid taper instructions:   Complete by: As directed    POST-OPERATIVE OPIOID TAPER INSTRUCTIONS: It is important to wean off of your opioid medication as soon as possible. If you do not need pain medication after your surgery it is ok to stop day one. Opioids include: Codeine, Hydrocodone(Norco, Vicodin), Oxycodone(Percocet, oxycontin) and hydromorphone amongst others.  Long term and even short term use of opiods can cause: Increased pain  response Dependence Constipation Depression Respiratory depression And more.  Withdrawal symptoms can include Flu like symptoms Nausea, vomiting And more Techniques to manage these symptoms Hydrate well Eat regular healthy meals Stay active Use relaxation techniques(deep breathing, meditating, yoga) Do Not substitute Alcohol to help with tapering If you have been on opioids for less than two weeks and do not have pain than it is ok to stop all together.  Plan to wean off of opioids This plan should start within one week post  op of your joint replacement. Maintain the same interval or time between taking each dose and first decrease the dose.  Cut the total daily intake of opioids by one tablet each day Next start to increase the time between doses. The last dose that should be eliminated is the evening dose.             Signed: Donella Stade 06/14/2021, 1:12 PM

## 2021-06-14 NOTE — Progress Notes (Signed)
Physical Therapy Treatment Patient Details Name: [name]: [id number] DOB: 1946-08-28 Today's Date: 06/14/2021    History of Present Illness Pt is a 75 y.o. female s/p R TKA on 06/09/21. PMH includes lumbar radiculopathy, depression, anal CA.    PT Comments    Pt seated in recliner with alarm sounding requesting to use bathroom.  PTA assisted patient to bathroom and then performed gt training after.  She continues to require cues to stay close to device as she has a tendency to push device to far.  She was observed picking up RW to negotiate obstacle and required cues for for safety as this was not safe practice.  Continue to recommend rehab in a post acute setting as she lives home alone and will need to be independent to return home safely.       Follow Up Recommendations  Follow surgeon's recommendation for DC plan and follow-up therapies;SNF;Supervision for mobility/OOB     Equipment Recommendations  3in1 (PT)    Recommendations for Other Services       Precautions / Restrictions Precautions Precautions: Fall;Knee Precaution Comments: Reviewed knee precautions with patient Restrictions Weight Bearing Restrictions: Yes RLE Weight Bearing: Weight bearing as tolerated    Mobility  Bed Mobility Overal bed mobility: Needs Assistance Bed Mobility: Sit to Supine       Sit to supine: Supervision   General bed mobility comments: Pt able to make to bed with supervision for safety.    Transfers Overall transfer level: Needs assistance Equipment used: Rolling walker (2 wheeled) Transfers: Sit to/from Stand Sit to Stand: Supervision         General transfer comment: Pt impulsive to rise into standing.  Supervision for safety.  Ambulation/Gait Ambulation/Gait assistance: Supervision;Min guard Gait Distance (Feet): 150 Feet Assistive device: Rolling walker (2 wheeled) Gait Pattern/deviations: Step-through pattern;Decreased stride length;Decreased weight shift  to right;Trunk flexed;Antalgic     General Gait Details: Pt less fatigue with gt speed improving.  Cues for negotiating obstacles in tight spaces to maintain safety.  pt picking up RW and attempting to walk while holding device in the air.  Cues to ground RW for safety.   Stairs             Wheelchair Mobility    Modified Rankin (Stroke Patients Only)       Balance Overall balance assessment: Needs assistance Sitting-balance support: Feet supported Sitting balance-Leahy Scale: Fair       Standing balance-Leahy Scale: Poor Standing balance comment: Reliant on BUE support                            Cognition Arousal/Alertness: Awake/alert Behavior During Therapy: Impulsive Overall Cognitive Status: No family/caregiver present to determine baseline cognitive functioning Area of Impairment: Problem solving;Safety/judgement                   Current Attention Level: Sustained;Selective Memory: Decreased short-term memory;Decreased recall of precautions Following Commands: Follows one step commands with increased time Safety/Judgement: Decreased awareness of safety;Decreased awareness of deficits Awareness: Emergent Problem Solving: Decreased initiation;Difficulty sequencing;Requires verbal cues General Comments: Pt very impulsive and eager this session.      Exercises Total Joint Exercises Ankle Circles/Pumps: AROM;Both;20 reps;Supine Quad Sets: AROM;Right;10 reps;Supine Heel Slides: Right;10 reps;Supine;AROM Hip ABduction/ADduction: Right;10 reps;Supine;AROM Straight Leg Raises: Right;10 reps;Supine;AROM Long Arc Quad: AROM;Right;Seated Goniometric ROM: 6-96 R knee AROM    General Comments        Pertinent  Vitals/Pain Pain Assessment: Faces Faces Pain Scale: Hurts even more Pain Location: R knee Pain Descriptors / Indicators: Operative site guarding;Grimacing;Sore Pain Intervention(s): Repositioned;Ice applied    Home Living                       Prior Function            PT Goals (current goals can now be found in the care plan section) Acute Rehab PT Goals Patient Stated Goal: to get home Potential to Achieve Goals: Good Progress towards PT goals: Progressing toward goals    Frequency    7X/week      PT Plan Current plan remains appropriate    Co-evaluation              AM-PAC PT "6 Clicks" Mobility   Outcome Measure  Help needed turning from your back to your side while in a flat bed without using bedrails?: A Little Help needed moving from lying on your back to sitting on the side of a flat bed without using bedrails?: A Little Help needed moving to and from a bed to a chair (including a wheelchair)?: A Little Help needed standing up from a chair using your arms (e.g., wheelchair or bedside chair)?: A Little Help needed to walk in hospital room?: A Little Help needed climbing 3-5 steps with a railing? : A Little 6 Click Score: 18    End of Session Equipment Utilized During Treatment: Gait belt Activity Tolerance: Patient tolerated treatment well Patient left: in bed;with bed alarm set;with call bell/phone within reach;with chair alarm set Nurse Communication: Mobility status PT Visit Diagnosis: Pain;Muscle weakness (generalized) (M62.81);Difficulty in walking, not elsewhere classified (R26.2) Pain - Right/Left: Right Pain - part of body: Knee     Time: 1041-1100 PT Time Calculation (min) (ACUTE ONLY): 19 min  Charges:  $Gait Training: 8-22 mins                     Erasmo Leventhal , PTA Acute Rehabilitation Services Pager [phone] Office [phone]    [name] Eli Hose 06/14/2021, 1:42 PM

## 2021-06-14 NOTE — Plan of Care (Signed)
Waiting for pending covid test. Discharged to [facility] today.   Problem: Education: Goal: Knowledge of General Education information will improve Description: Including pain rating scale, medication(s)/side effects and non-pharmacologic comfort measures Outcome: Progressing   Problem: Health Behavior/Discharge Planning: Goal: Ability to manage health-related needs will improve Outcome: Progressing   Problem: Clinical Measurements: Goal: Ability to maintain clinical measurements within normal limits will improve Outcome: Progressing Goal: Will remain free from infection Outcome: Progressing Goal: Diagnostic test results will improve Outcome: Progressing Goal: Respiratory complications will improve Outcome: Progressing Goal: Cardiovascular complication will be avoided Outcome: Progressing   Problem: Activity: Goal: Risk for activity intolerance will decrease Outcome: Progressing   Problem: Nutrition: Goal: Adequate nutrition will be maintained Outcome: Progressing   Problem: Coping: Goal: Level of anxiety will decrease Outcome: Progressing   Problem: Elimination: Goal: Will not experience complications related to bowel motility Outcome: Progressing Goal: Will not experience complications related to urinary retention Outcome: Progressing   Problem: Pain Managment: Goal: General experience of comfort will improve Outcome: Progressing   Problem: Safety: Goal: Ability to remain free from injury will improve Outcome: Progressing   Problem: Skin Integrity: Goal: Risk for impaired skin integrity will decrease Outcome: Progressing

## 2021-06-14 NOTE — TOC Progression Note (Addendum)
Transition of Care [facility]) - Progression Note    Patient Details  Name: ILIA SPROULL MRN: [id number] Date of Birth: [birth year]  [facility]) CM/SW Contact  Elliot Gurney [facility], [place] Phone Number: [phone] 06/14/2021, 11:58 AM  Clinical Narrative:    Patient to discharge to [facility] following covid booster and results of covid test. Admission confirmed with Estate manager/Coreyon Nicotra agent for [employer]. She will call back with room number and contact information to call report.  1:25 pm Patient will be going to room 125 at [facility]. Please call in report to [phone].  3:20pm covid results still pending for transfer. Message left with Juliann Pulse at [facility].   [address] LCSW Transition of Care [phone]    Expected Discharge Plan: Skilled Nursing Facility Barriers to Discharge: Insurance Authorization, SNF Pending bed offer  Expected Discharge Plan and Services Expected Discharge Plan: [place]         Expected Discharge Date: 06/14/21                                     Social Determinants of Health (SDOH) Interventions    Readmission Risk Interventions No flowsheet data found.

## 2021-06-14 NOTE — TOC Progression Note (Signed)
Transition of Care [facility]) - Progression Note    Patient Details  Name: [name]: [id number] Date of Birth: 1945/11/13  [facility]) CM/SW Contact  Elliot Gurney [place],  Phone Number: [phone] 06/14/2021, 4:27 PM  Clinical Narrative:    Patient's covid results still pending, patient will not be able to transition to SNF until covid results are obtained. Juliann Pulse, admissions coordinator is aware and confirms that patient cannot be admitted until covid results are obtained.   [address], LCSW Transition of Care [phone]    Expected Discharge Plan: Skilled Nursing Facility Barriers to Discharge: Insurance Authorization, SNF Pending bed offer  Expected Discharge Plan and Services Expected Discharge Plan: [place]         Expected Discharge Date: 06/14/21                                     Social Determinants of Health (SDOH) Interventions    Readmission Risk Interventions No flowsheet data found.

## 2021-06-15 LAB — RESP PANEL BY RT-PCR (FLU A&B, COVID) ARPGX2
Influenza A by PCR: NEGATIVE
Influenza B by PCR: NEGATIVE
SARS Coronavirus 2 by RT PCR: NEGATIVE

## 2021-06-15 LAB — SARS CORONAVIRUS 2 (TAT 6-24 HRS): SARS Coronavirus 2: NEGATIVE

## 2021-06-15 NOTE — Progress Notes (Signed)
Physical Therapy Treatment Patient Details Name: [name]: [id number] DOB: [birth year] Today's Date: 06/15/2021    History of Present Illness Pt is a 75 y.o. female s/p R TKA on 06/09/21. PMH includes lumbar radiculopathy, depression, anal CA.    PT Comments    Focused on gt training this session with emphasis on posture and RW position.  Pt to d/c to SNF today.  She reports more pain this am.     Follow Up Recommendations  Follow surgeon's recommendation for DC plan and follow-up therapies;SNF;Supervision for mobility/OOB     Equipment Recommendations  3in1 (PT)    Recommendations for Other Services       Precautions / Restrictions Precautions Precautions: Fall;Knee Precaution Booklet Issued: No Precaution Comments: Reviewed knee precautions with patient Restrictions Weight Bearing Restrictions: Yes RLE Weight Bearing: Weight bearing as tolerated    Mobility  Bed Mobility Overal bed mobility: Needs Assistance Bed Mobility: Sit to Supine       Sit to supine: Supervision   General bed mobility comments: Pt able to move back to bed with increased time due to painful R knee.    Transfers Overall transfer level: Needs assistance Equipment used: Rolling walker (2 wheeled) Transfers: Sit to/from Stand Sit to Stand: Supervision         General transfer comment: Cues for safety as patient attempting to stand without RW in front of her.  Cues to back entirely to seated surface before sitting.  Ambulation/Gait Ambulation/Gait assistance: Supervision;Min guard Gait Distance (Feet): 150 Feet (x2 trials.) Assistive device: Rolling walker (2 wheeled) Gait Pattern/deviations: Step-through pattern;Decreased stride length;Decreased weight shift to right;Trunk flexed;Antalgic Gait velocity: Decreased   General Gait Details: Pt more fatigued after two gt trials and very slow cadence.  She continues to require cues for obstacle negotiation. Cues for hip extension and  upper trunk control to improve posture.  Cues to step closer to device.   Stairs             Wheelchair Mobility    Modified Rankin (Stroke Patients Only)       Balance Overall balance assessment: Needs assistance Sitting-balance support: Feet supported Sitting balance-Leahy Scale: Fair       Standing balance-Leahy Scale: Poor Standing balance comment: Reliant on BUE support                            Cognition Arousal/Alertness: Awake/alert Behavior During Therapy: Impulsive Overall Cognitive Status: No family/caregiver present to determine baseline cognitive functioning Area of Impairment: Problem solving;Safety/judgement                   Current Attention Level: Sustained;Selective Memory: Decreased short-term memory;Decreased recall of precautions Following Commands: Follows one step commands with increased time Safety/Judgement: Decreased awareness of safety;Decreased awareness of deficits Awareness: Emergent Problem Solving: Decreased initiation;Difficulty sequencing;Requires verbal cues General Comments: Pt very impulsive and eager this session.      Exercises      General Comments        Pertinent Vitals/Pain Pain Assessment: Faces Pain Score: 7  Pain Location: R knee Pain Descriptors / Indicators: Operative site guarding;Grimacing;Sore Pain Intervention(s): Monitored during session;Repositioned;Ice applied    Home Living                      Prior Function            PT Goals (current goals can now be found in the  care plan section) Acute Rehab PT Goals Patient Stated Goal: to get home Potential to Achieve Goals: Good Progress towards PT goals: Progressing toward goals    Frequency    7X/week      PT Plan Current plan remains appropriate    Co-evaluation              AM-PAC PT "6 Clicks" Mobility   Outcome Measure  Help needed turning from your back to your side while in a flat bed without  using bedrails?: A Little Help needed moving from lying on your back to sitting on the side of a flat bed without using bedrails?: A Little Help needed moving to and from a bed to a chair (including a wheelchair)?: A Little Help needed standing up from a chair using your arms (e.g., wheelchair or bedside chair)?: A Little Help needed to walk in hospital room?: A Little Help needed climbing 3-5 steps with a railing? : A Little 6 Click Score: 18    End of Session Equipment Utilized During Treatment: Gait belt Activity Tolerance: Patient tolerated treatment well Patient left: in bed;with bed alarm set;with call bell/phone within reach;with chair alarm set Nurse Communication: Mobility status PT Visit Diagnosis: Pain;Muscle weakness (generalized) (M62.81);Difficulty in walking, not elsewhere classified (R26.2) Pain - Right/Left: Right Pain - part of body: Knee     Time: 1022-1037 PT Time Calculation (min) (ACUTE ONLY): 15 min  Charges:  $Gait Training: 8-22 mins                    Erasmo Leventhal , PTA Acute Rehabilitation Services Pager [phone] Office [phone]    Arren Laminack Eli Hose 06/15/2021, 1:29 PM

## 2021-06-15 NOTE — Progress Notes (Addendum)
Patient stable.  Vitals stable with no change significantly compared with yesterday.  She continues to report progress with therapy and denies any chest pain, shortness of breath, calf pain.  She is POD 6 s/p right total knee arthroplasty.  Still awaiting COVID test result and then she is okay to discharge to skilled nursing facility.  Once COVID test result comes back, plan for discharge to SNF.  Clear from orthopedic standpoint.  On exam, dressing is intact, no calf tenderness.  No surrounding cellulitis around the dressing.  Discharge summary updated to reflect today's discharge date and physical prescriptions for pain medication are in patient's chart from yesterday.  Patient covid test has returned this morning and she is covid negative

## 2021-06-15 NOTE — TOC Transition Note (Signed)
Transition of Care [facility]) - CM/SW Discharge Note   Patient Details  Name: [name]: [id number] Date of Birth: 1945-12-26  [facility] CM/SW Contact:  Milinda Antis, [place] Phone Number: 06/15/2021, 12:41 PM   Clinical Narrative:     Patient will DC to: [facility] date: 06/15/2021 Family notified: Yes Transport by:  Corey Harold   Per MD patient ready for DC to SNF . RN to call report prior to discharge to [phone]. RN, patient, patient's family, and facility notified of DC. Discharge Summary and FL2 sent to facility. DC packet on chart. Ambulance transport will be requested for patient when RN reports that patient is ready.   CSW will sign off for now as social work intervention is no longer needed. Please consult [place] again if new needs arise.    Final next level of care: Skilled Nursing Facility Barriers to Discharge: Barriers Resolved   Patient Goals and CMS Choice Patient states their goals for this hospitalization and ongoing recovery are:: To got to rehab and get better CMS Medicare.gov Compare Post Acute Care list provided to:: Patient Choice offered to / list presented to : Patient  Discharge Placement             Patient chooses bed at:  [facility]) Patient to be transferred to facility by: [place] Name of family member notified: Carilyn Goodpasture (Daughter)   [phone] Patient and family notified of of transfer: 06/15/21  Discharge Plan and Services                                     Social Determinants of Health (SDOH) Interventions     Readmission Risk Interventions No flowsheet data found.

## 2021-06-15 NOTE — Plan of Care (Signed)
  Problem: Education: Goal: Knowledge of General Education information will improve Description: Including pain rating scale, medication(s)/side effects and non-pharmacologic comfort measures Outcome: Adequate for Discharge   Problem: Clinical Measurements: Goal: Ability to maintain clinical measurements within normal limits will improve Outcome: Adequate for Discharge Goal: Will remain free from infection Outcome: Adequate for Discharge   Problem: Activity: Goal: Risk for activity intolerance will decrease Outcome: Adequate for Discharge   Problem: Nutrition: Goal: Adequate nutrition will be maintained Outcome: Adequate for Discharge   Problem: Coping: Goal: Level of anxiety will decrease Outcome: Adequate for Discharge   Problem: Elimination: Goal: Will not experience complications related to bowel motility Outcome: Adequate for Discharge   Problem: Pain Managment: Goal: General experience of comfort will improve Outcome: Adequate for Discharge   Problem: Skin Integrity: Goal: Risk for impaired skin integrity will decrease Outcome: Adequate for Discharge

## 2021-06-24 ENCOUNTER — Encounter: Payer: Self-pay | Admitting: Orthopedic Surgery

## 2021-06-24 ENCOUNTER — Other Ambulatory Visit: Payer: Self-pay

## 2021-06-24 ENCOUNTER — Ambulatory Visit (INDEPENDENT_AMBULATORY_CARE_PROVIDER_SITE_OTHER): Payer: Medicare HMO | Admitting: Orthopedic Surgery

## 2021-06-24 ENCOUNTER — Ambulatory Visit (INDEPENDENT_AMBULATORY_CARE_PROVIDER_SITE_OTHER): Payer: Medicare HMO

## 2021-06-24 ENCOUNTER — Telehealth: Payer: Self-pay | Admitting: Orthopedic Surgery

## 2021-06-24 DIAGNOSIS — Z96651 Presence of right artificial knee joint: Secondary | ICD-10-CM

## 2021-06-24 NOTE — Telephone Encounter (Signed)
No bath til 4 weeks post op pls claal thx

## 2021-06-24 NOTE — Telephone Encounter (Signed)
Contacted patient and informed her that she is not to take a bath until she is 4 week post op from her surgery. Patient understood and had no further questions or concerns.

## 2021-06-24 NOTE — Telephone Encounter (Signed)
Pt called requesting a call back. Pt wanted to know if she can a bath. Please call pt at [phone].

## 2021-06-24 NOTE — Progress Notes (Signed)
Post-Op Visit Note   [name]           Date of Birth: Aug 14, 1946           MRN: [id number] Visit Date: 06/24/2021 PCP: [name], MD   Assessment & Plan:  Chief Complaint:  Chief Complaint  Patient presents with   Other    06/09/21 right TKA   Visit Diagnoses:  1. S/P total knee arthroplasty, right     Plan: [name] is a 75 year old patient is now 2 weeks out right total knee replacement.  She has been doing well.  Did home health physical therapy yesterday.  On exam she has range of motion 0-100.  Moving well with the walker.  Quad strength is good.  No calf tenderness with negative Homans today.  Continue with aspirin for DVT prophylaxis.  Home health physical therapy this week then begin outpatient physical therapy next week here 2 times a week for 4 weeks plus home exercise program.  Follow-up with [facility] in 4 weeks for final clinical recheck.  Overall she is doing exceptionally well.  Radiographs show the prosthesis to be well aligned and well positioned.  Follow-Up Instructions: Return in about 4 weeks (around 07/22/2021).   Orders:  Orders Placed This Encounter  Procedures   XR Knee 1-2 Views Right   Ambulatory referral to Physical Therapy   No orders of the defined types were placed in this encounter.   Imaging: XR Knee 1-2 Views Right  Result Date: 06/24/2021 AP lateral radiographs right knee reviewed.  Cemented total knee prosthesis in good position and alignment with no complicating features.   PMFS History: Patient Active Problem List   Diagnosis Date Noted   S/P total knee arthroplasty, right 06/09/2021   Fever    Staphylococcus aureus bacteremia with sepsis ([place])    SIRS (systemic inflammatory response syndrome) ([place]) 11/30/2020   Lumbar radiculopathy 10/10/2019   Bilateral stenosis of lateral recess of lumbar spine 10/10/2019   Hemorrhoids, external without complications 0000000   Depression    Anxiety    Rectal bleeding     History of radiation therapy    Malignant neoplasm of anal canal ([place]) 11/15/2011   Hemorrhoids, internal, with bleeding 09/07/2011   Acquired anal stenosis 09/07/2011   Hyperlipidemia 09/07/2011   Family history of breast cancer in sister 09/07/2011   Past Medical History:  Diagnosis Date   anal ca dx'd 11/2011   squamous cell carcinoma   Anxiety    Arthritis 2022   Depression    Full dentures    Hemorrhoids    external   History of radiation therapy 11/29/11 to 01/05/12   anal canal 5040 cGy 28 sessions, regional lymph nodes 4200 cGy 28 sessions   Hyperlipidemia    Rectal bleeding    Vitamin D deficiency    Wears glasses     Family History  Problem Relation Age of Onset   Cancer Sister        breast/ age 6   Cervical cancer Sister        14   Alcohol abuse Maternal Uncle     Past Surgical History:  Procedure Laterality Date   ABDOMINAL HYSTERECTOMY  1978   age 7   BUBBLE STUDY  12/04/2020   Procedure: BUBBLE STUDY;  Surgeon: [name], MD;  Location: [facility];  Service: Cardiology;;   EXAMINATION UNDER ANESTHESIA  06/21/2012   Procedure: Jasmine December UNDER ANESTHESIA;  Surgeon: [name], MD;  Location:  [facility];  Service: General;  Laterality: N/A;  rectal exam under anesthesia, anal dilation, rectal biopsy   [place]   Patient had to guess on the date.   RECTAL BIOPSY  06/21/2012   Procedure: BIOPSY RECTAL;  Surgeon: [name], MD;  Location: [facility];  Service: General;  Laterality: N/A;   TEE WITHOUT CARDIOVERSION N/A 12/04/2020   Procedure: TRANSESOPHAGEAL ECHOCARDIOGRAM (TEE);  Surgeon: [name], MD;  Location: [facility];  Service: Cardiology;  Laterality: N/A;   TOTAL KNEE ARTHROPLASTY Right 06/09/2021   Procedure: RIGHT TOTAL KNEE ARTHROPLASTY;  Surgeon: Meredith Pel, MD;  Location: [facility];  Service: Orthopedics;  Laterality: Right;   TUBAL LIGATION     b/l  age 30   Social  History   Occupational History   Not on file  Tobacco Use   Smoking status: Former    Packs/day: 0.50    Years: 25.00    Pack years: 12.50    Types: Cigarettes    Quit date: 09/07/1987    Years since quitting: 33.8   Smokeless tobacco: Never  Vaping Use   Vaping Use: Never used  Substance and Sexual Activity   Alcohol use: No   Drug use: No   Sexual activity: Not Currently

## 2021-06-24 NOTE — Telephone Encounter (Signed)
Please see message below. Patient is s/p R TKA preformed on 06/09/2021

## 2021-06-25 ENCOUNTER — Inpatient Hospital Stay (HOSPITAL_COMMUNITY)
Admission: EM | Admit: 2021-06-25 | Discharge: 2021-06-28 | DRG: 871 | Disposition: A | Discharge status: Home / Self Care | Payer: Medicare HMO | Attending: Internal Medicine | Admitting: Internal Medicine

## 2021-06-25 ENCOUNTER — Emergency Department (HOSPITAL_COMMUNITY): Payer: Medicare HMO

## 2021-06-25 ENCOUNTER — Other Ambulatory Visit: Payer: Self-pay

## 2021-06-25 ENCOUNTER — Encounter (HOSPITAL_COMMUNITY): Payer: Self-pay | Admitting: *Deleted

## 2021-06-25 DIAGNOSIS — Z85048 Personal history of other malignant neoplasm of rectum, rectosigmoid junction, and anus: Secondary | ICD-10-CM

## 2021-06-25 DIAGNOSIS — E039 Hypothyroidism, unspecified: Secondary | ICD-10-CM | POA: Diagnosis not present

## 2021-06-25 DIAGNOSIS — R509 Fever, unspecified: Secondary | ICD-10-CM

## 2021-06-25 DIAGNOSIS — Z7982 Long term (current) use of aspirin: Secondary | ICD-10-CM

## 2021-06-25 DIAGNOSIS — A419 Sepsis, unspecified organism: Secondary | ICD-10-CM | POA: Diagnosis not present

## 2021-06-25 DIAGNOSIS — Z9851 Tubal ligation status: Secondary | ICD-10-CM

## 2021-06-25 DIAGNOSIS — Z923 Personal history of irradiation: Secondary | ICD-10-CM

## 2021-06-25 DIAGNOSIS — Z972 Presence of dental prosthetic device (complete) (partial): Secondary | ICD-10-CM

## 2021-06-25 DIAGNOSIS — Z87891 Personal history of nicotine dependence: Secondary | ICD-10-CM

## 2021-06-25 DIAGNOSIS — G9341 Metabolic encephalopathy: Secondary | ICD-10-CM | POA: Diagnosis not present

## 2021-06-25 DIAGNOSIS — M1990 Unspecified osteoarthritis, unspecified site: Secondary | ICD-10-CM | POA: Diagnosis present

## 2021-06-25 DIAGNOSIS — E559 Vitamin D deficiency, unspecified: Secondary | ICD-10-CM | POA: Diagnosis present

## 2021-06-25 DIAGNOSIS — E785 Hyperlipidemia, unspecified: Secondary | ICD-10-CM | POA: Diagnosis present

## 2021-06-25 DIAGNOSIS — Z90710 Acquired absence of both cervix and uterus: Secondary | ICD-10-CM

## 2021-06-25 DIAGNOSIS — Z79899 Other long term (current) drug therapy: Secondary | ICD-10-CM

## 2021-06-25 DIAGNOSIS — Z79890 Hormone replacement therapy (postmenopausal): Secondary | ICD-10-CM

## 2021-06-25 DIAGNOSIS — Z20822 Contact with and (suspected) exposure to covid-19: Secondary | ICD-10-CM | POA: Diagnosis present

## 2021-06-25 DIAGNOSIS — Z96651 Presence of right artificial knee joint: Secondary | ICD-10-CM

## 2021-06-25 DIAGNOSIS — F32A Depression, unspecified: Secondary | ICD-10-CM | POA: Diagnosis not present

## 2021-06-25 DIAGNOSIS — F419 Anxiety disorder, unspecified: Secondary | ICD-10-CM | POA: Diagnosis present

## 2021-06-25 DIAGNOSIS — R6520 Severe sepsis without septic shock: Secondary | ICD-10-CM | POA: Diagnosis present

## 2021-06-25 DIAGNOSIS — N390 Urinary tract infection, site not specified: Secondary | ICD-10-CM | POA: Diagnosis present

## 2021-06-25 LAB — URINALYSIS, ROUTINE W REFLEX MICROSCOPIC
Bilirubin Urine: NEGATIVE
Glucose, UA: NEGATIVE mg/dL
Ketones, ur: NEGATIVE mg/dL
Nitrite: POSITIVE — AB
Protein, ur: 30 mg/dL — AB
Specific Gravity, Urine: 1.009 (ref 1.005–1.030)
pH: 7 (ref 5.0–8.0)

## 2021-06-25 LAB — COMPREHENSIVE METABOLIC PANEL
ALT: 14 U/L (ref 0–44)
AST: 22 U/L (ref 15–41)
Albumin: 3.8 g/dL (ref 3.5–5.0)
Alkaline Phosphatase: 76 U/L (ref 38–126)
Anion gap: 9 (ref 5–15)
BUN: 11 mg/dL (ref 8–23)
CO2: 27 mmol/L (ref 22–32)
Calcium: 9.3 mg/dL (ref 8.9–10.3)
Chloride: 103 mmol/L (ref 98–111)
Creatinine, Ser: 0.72 mg/dL (ref 0.44–1.00)
GFR, Estimated: >60 mL/min (ref >60)
Glucose, Bld: 94 mg/dL (ref 70–99)
Potassium: 4 mmol/L (ref 3.5–5.1)
Sodium: 139 mmol/L (ref 135–145)
Total Bilirubin: 0.7 mg/dL (ref 0.3–1.2)
Total Protein: 7.3 g/dL (ref 6.5–8.1)

## 2021-06-25 LAB — CBC WITH DIFFERENTIAL/PLATELET
Abs Immature Granulocytes: 0.04 10*3/uL (ref 0.00–0.07)
Basophils Absolute: 0 10*3/uL (ref 0.0–0.1)
Basophils Relative: 0 %
Eosinophils Absolute: 0 10*3/uL (ref 0.0–0.5)
Eosinophils Relative: 0 %
HCT: 33.9 % — ABNORMAL LOW (ref 36.0–46.0)
Hemoglobin: 10.5 g/dL — ABNORMAL LOW (ref 12.0–15.0)
Immature Granulocytes: 1 %
Lymphocytes Relative: 9 %
Lymphs Abs: 0.8 10*3/uL (ref 0.7–4.0)
MCH: 32.6 pg (ref 26.0–34.0)
MCHC: 31 g/dL (ref 30.0–36.0)
MCV: 105.3 fL — ABNORMAL HIGH (ref 80.0–100.0)
Monocytes Absolute: 0.9 10*3/uL (ref 0.1–1.0)
Monocytes Relative: 10 %
Neutro Abs: 6.8 10*3/uL (ref 1.7–7.7)
Neutrophils Relative %: 80 %
Platelets: 338 10*3/uL (ref 150–400)
RBC: 3.22 MIL/uL — ABNORMAL LOW (ref 3.87–5.11)
RDW: 13.3 % (ref 11.5–15.5)
WBC: 8.5 10*3/uL (ref 4.0–10.5)
nRBC: 0 % (ref 0.0–0.2)

## 2021-06-25 LAB — PROTIME-INR
INR: 1.1 (ref 0.8–1.2)
Prothrombin Time: 14.5 seconds (ref 11.4–15.2)

## 2021-06-25 LAB — LACTIC ACID, PLASMA
Lactic Acid, Venous: 3.8 mmol/L (ref 0.5–1.9)
Lactic Acid, Venous: 4.4 mmol/L (ref 0.5–1.9)
Lactic Acid, Venous: 0.9 mmol/L (ref 0.5–1.9)
Lactic Acid, Venous: 1.7 mmol/L (ref 0.5–1.9)

## 2021-06-25 LAB — RESP PANEL BY RT-PCR (FLU A&B, COVID) ARPGX2
Influenza A by PCR: NEGATIVE
Influenza B by PCR: NEGATIVE
SARS Coronavirus 2 by RT PCR: NEGATIVE

## 2021-06-25 LAB — TROPONIN I (HIGH SENSITIVITY)
Troponin I (High Sensitivity): 7 ng/L (ref <18)
Troponin I (High Sensitivity): 7 ng/L (ref <18)

## 2021-06-25 IMAGING — DX DG CHEST 1V PORT
1 series · 1 of 1 positions shown · non-contrast
Comparison: None.

CLINICAL DATA: Fever, increased confusion, multiple falls

EXAM:
PORTABLE CHEST 1 VIEW

[chest ap]
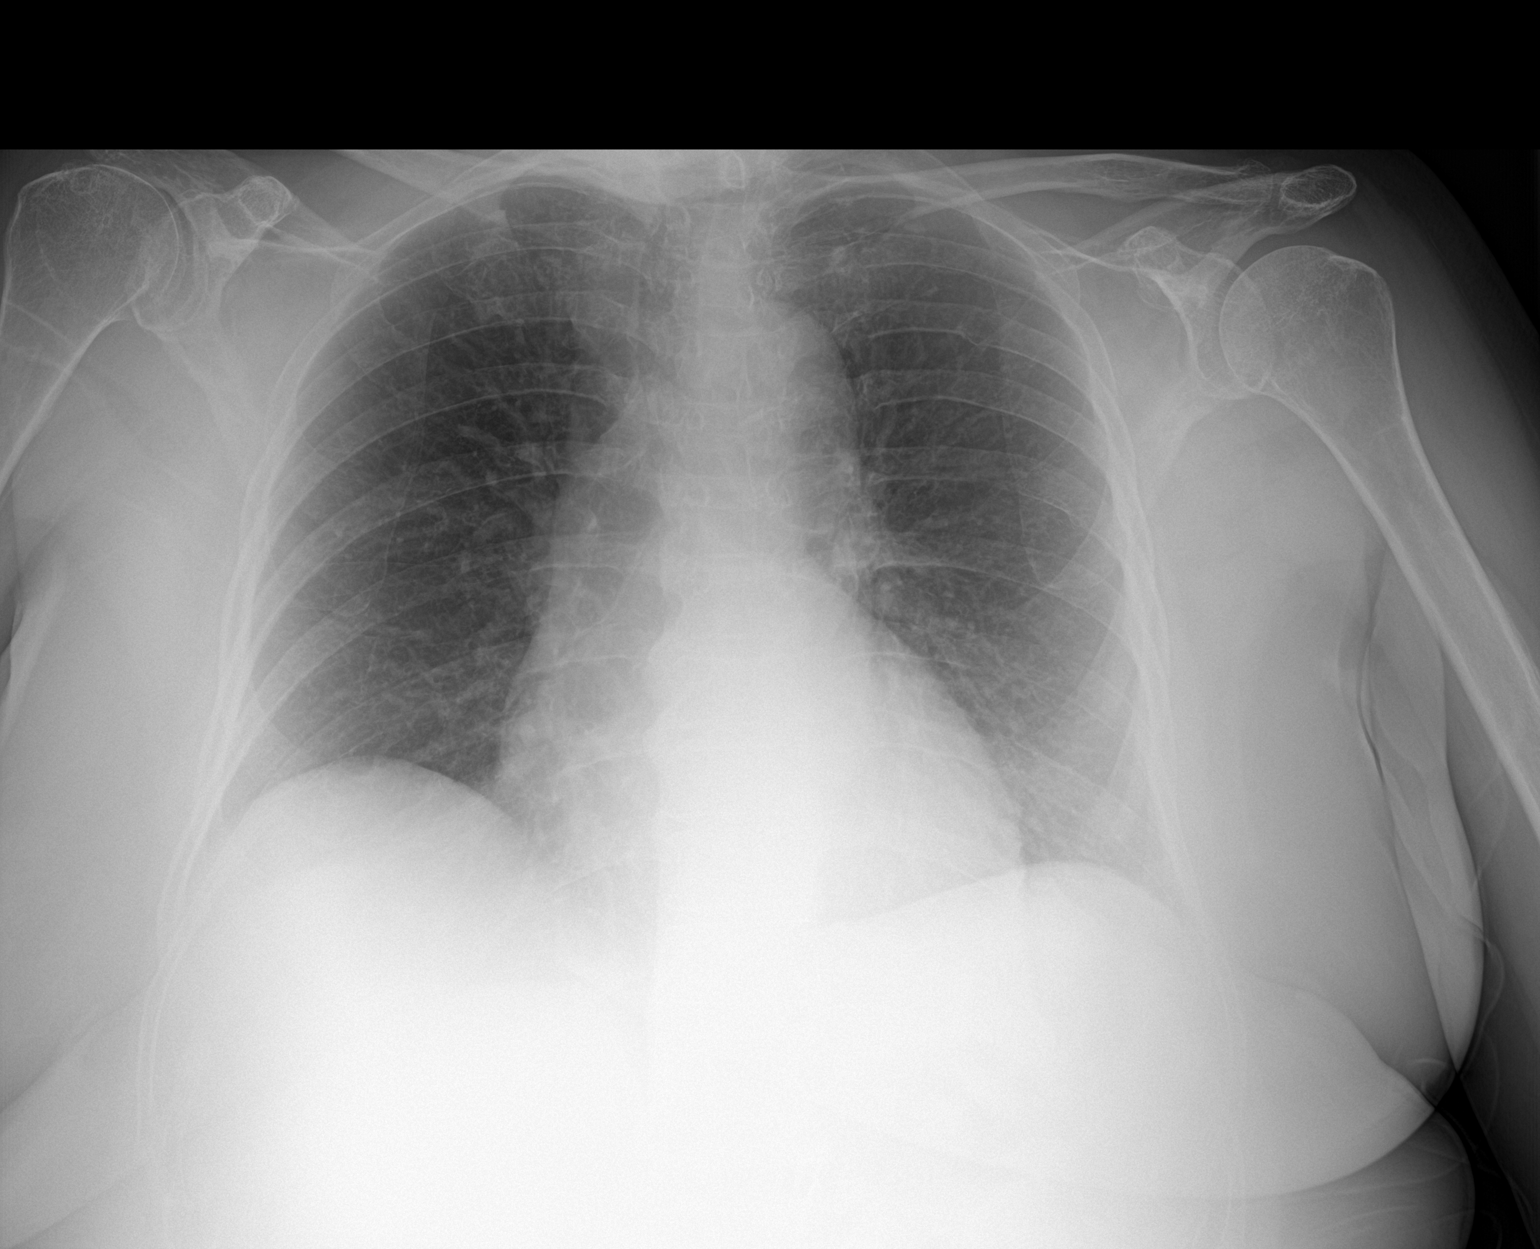

[1 of 1 positions shown; findings below may reference images not displayed]

FINDINGS: The heart size and mediastinal contours are within normal limits.
Both lungs are clear. The visualized skeletal structures are
unremarkable.
IMPRESSION: No active disease.

## 2021-06-25 IMAGING — CT CT HEAD W/O CM
3 series · 14 of 47 positions shown, 16 images · non-contrast
Comparison: CT head [DATE]

CLINICAL DATA: Altered mental status

EXAM:
CT HEAD WITHOUT CONTRAST
TECHNIQUE: Contiguous axial images were obtained from the base of the skull
through the vertex without intravenous contrast.

[Series 2: head wo · axial · 0.47mm/px · z∈[-108,+22]mm · 8 of 32 slices shown, 10 images]
[im 3/32  brain]
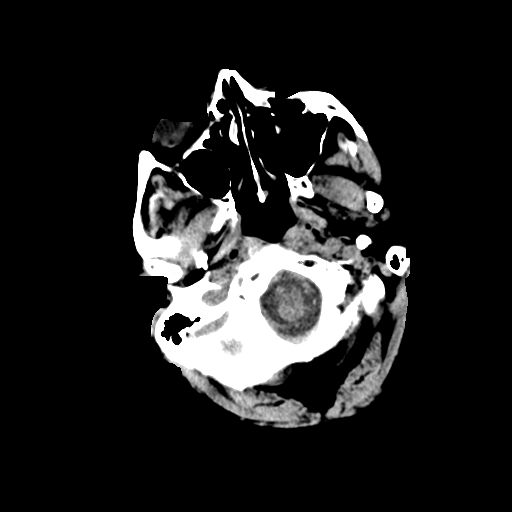
[im 3/32  bone]
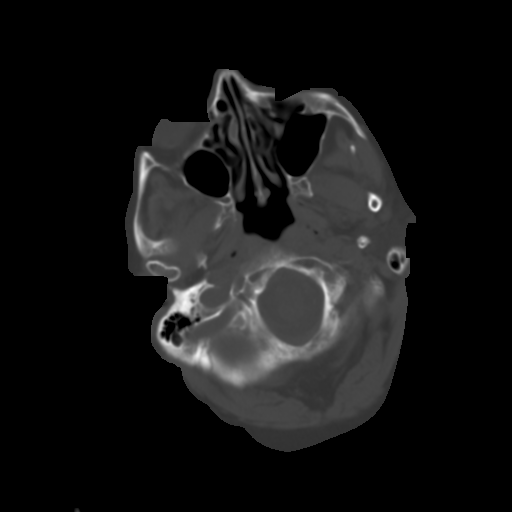
[im 7/32  brain]
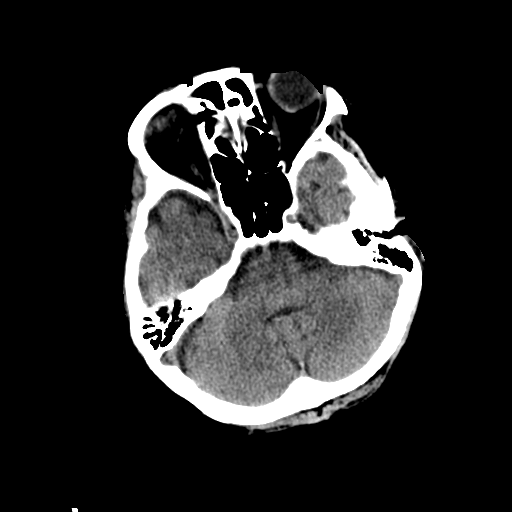
[im 10/32  brain]
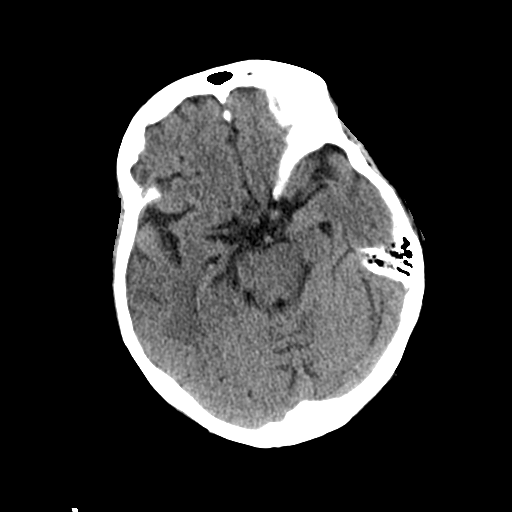
[im 14/32  brain]
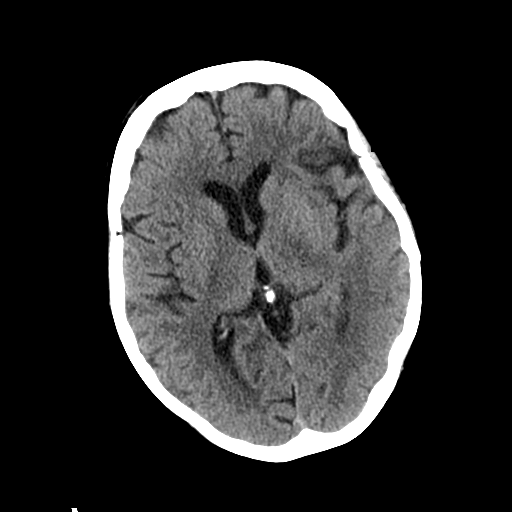
[im 18/32  brain]
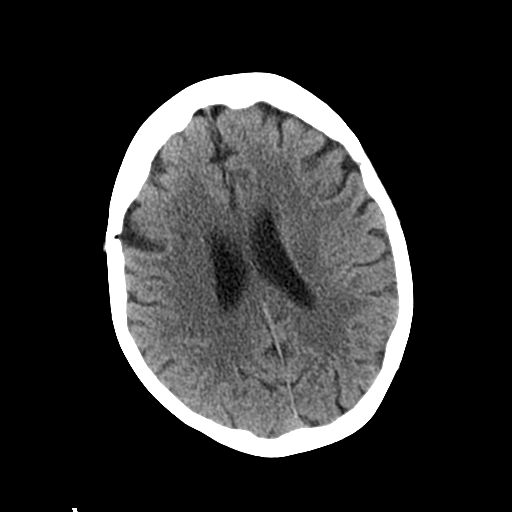
[im 18/32  bone]
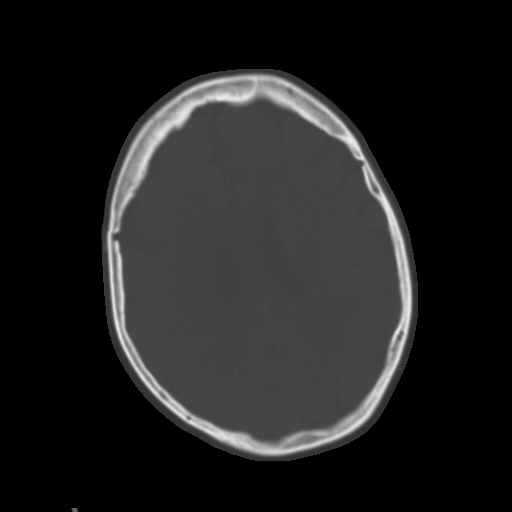
[im 22/32  brain]
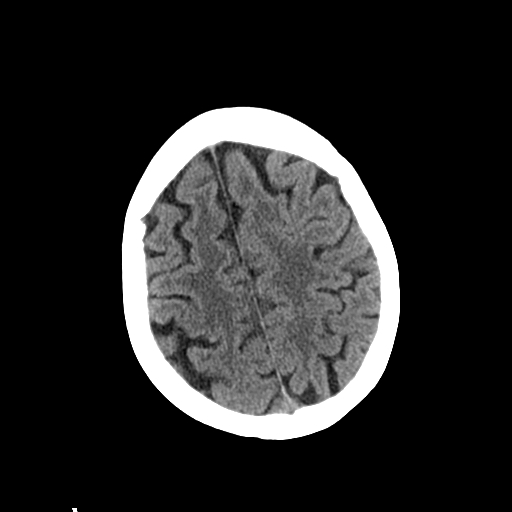
[im 25/32  brain]
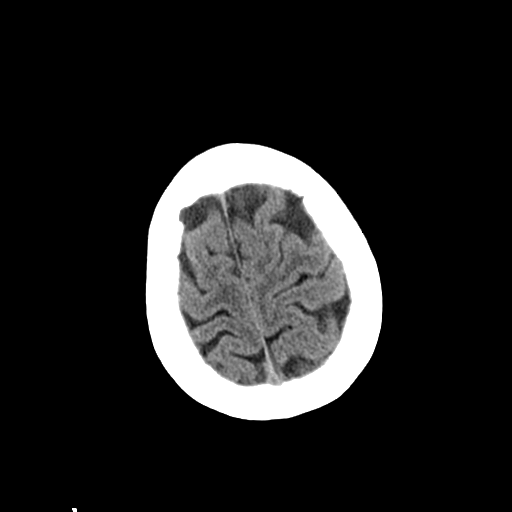
[im 29/32  brain]
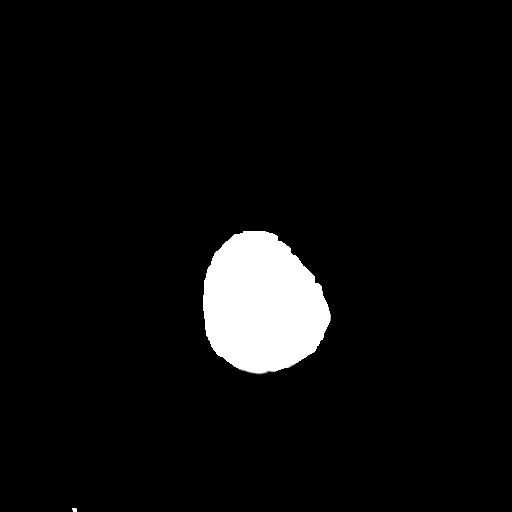

[Series 4: coronal soft tissue · coronal · 0.30mm/px · 3 of 64 slices shown]
[im 22/64  brain]
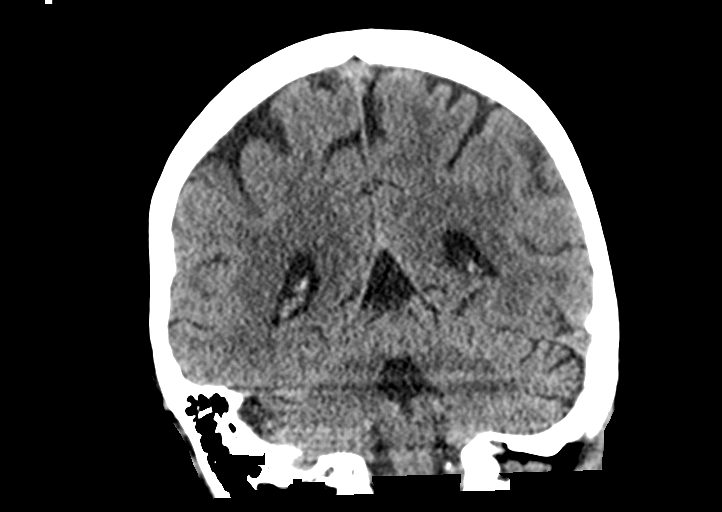
[im 29/64  brain]
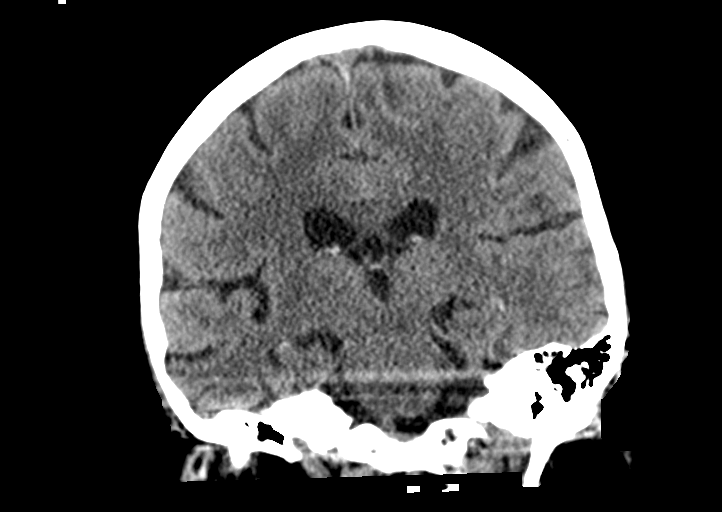
[im 36/64  brain]
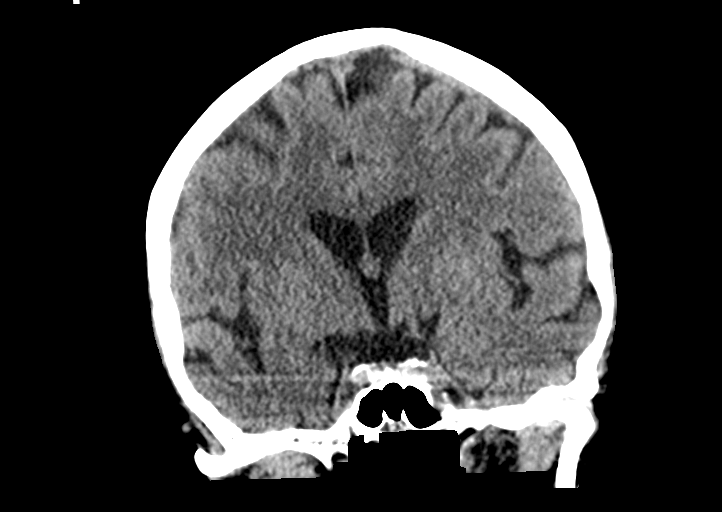

[Series 5: sagittal soft tissue · sagittal · 0.33mm/px · 3 of 52 slices shown]
[im 18/52  brain]
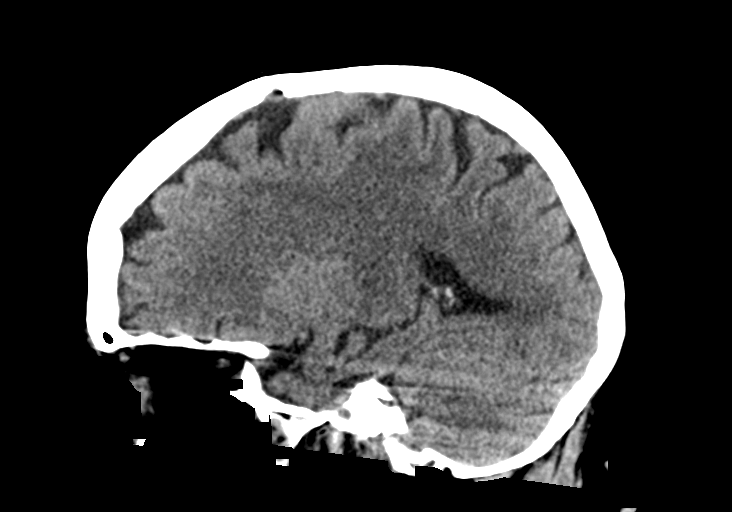
[im 26/52  brain]
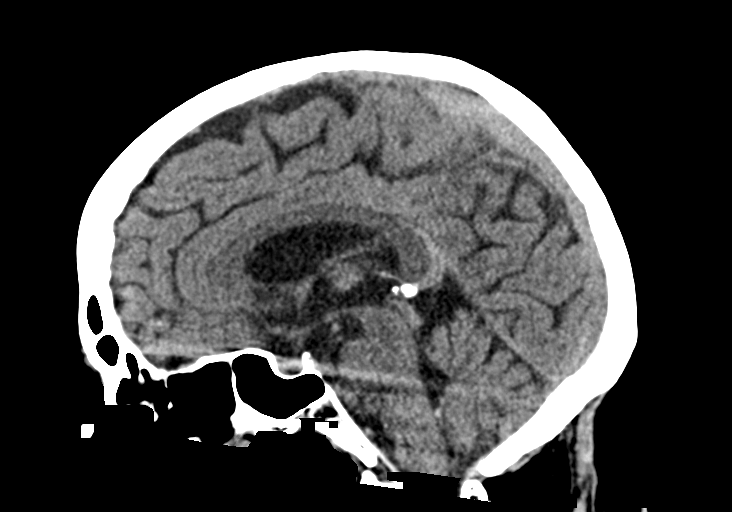
[im 35/52  brain]
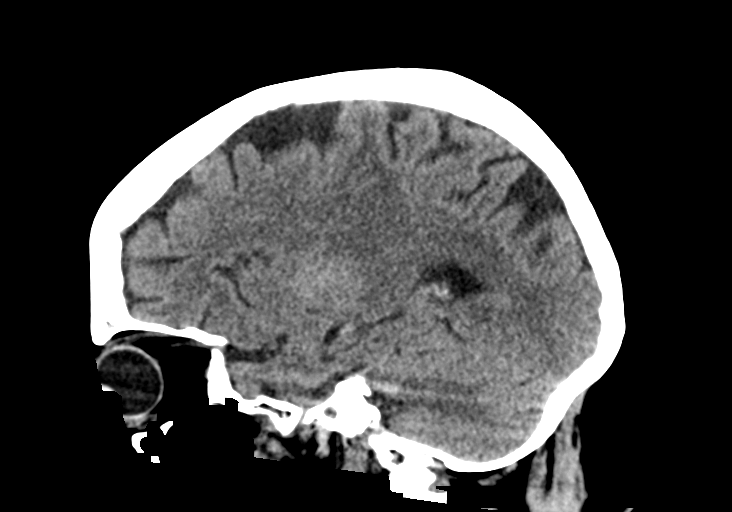

[14 of 47 positions shown; findings below may reference images not displayed]

FINDINGS: Brain: There is no evidence of acute intracranial hemorrhage,
extra-axial fluid collection, or infarct. The ventricles are stable
in size. There is no mass lesion. There is no midline shift. A
remote lacunar infarct in the left external capsule is unchanged.

Vascular: No hyperdense vessel or unexpected calcification.

Skull: Normal. Negative for fracture or focal lesion.

Sinuses/Orbits: The imaged paranasal sinuses are clear. The imaged
globes and orbits are unremarkable.

Other: None.
IMPRESSION: No acute intracranial pathology.

## 2021-06-25 MED ORDER — SODIUM CHLORIDE 0.9 % IV BOLUS
500.0000 mL | Freq: Once | INTRAVENOUS | Status: AC
  Administered 2021-06-25: 500 mL via INTRAVENOUS

## 2021-06-25 MED ORDER — SODIUM CHLORIDE 0.9 % IV SOLN
2.0000 g | Freq: Once | INTRAVENOUS | Status: AC
  Administered 2021-06-25: 2 g via INTRAVENOUS
  Filled 2021-06-25: qty 2

## 2021-06-25 MED ORDER — VANCOMYCIN HCL IN DEXTROSE 1-5 GM/200ML-% IV SOLN
1000.0000 mg | Freq: Once | INTRAVENOUS | Status: AC
  Administered 2021-06-25: 1000 mg via INTRAVENOUS
  Filled 2021-06-25: qty 200

## 2021-06-25 MED ORDER — ACETAMINOPHEN 500 MG PO TABS
1000.0000 mg | ORAL_TABLET | Freq: Once | ORAL | Status: AC
  Administered 2021-06-25: 1000 mg via ORAL
  Filled 2021-06-25: qty 2

## 2021-06-25 MED ORDER — ENOXAPARIN SODIUM 40 MG/0.4ML IJ SOSY
40.0000 mg | PREFILLED_SYRINGE | INTRAMUSCULAR | Status: DC
  Administered 2021-06-25 – 2021-06-27 (×3): 40 mg via SUBCUTANEOUS
  Filled 2021-06-25 (×3): qty 0.4

## 2021-06-25 MED ORDER — SODIUM CHLORIDE 0.9 % IV SOLN: INTRAVENOUS | Status: AC

## 2021-06-25 NOTE — ED Provider Notes (Signed)
[facility] DEPT Provider Note   CSN: [id number] Arrival date & time: 06/25/21  1501     History Chief Complaint  Patient presents with   Altered Mental Status   Fall    [name] is a 75 y.o. female.  75 year old female with prior medical history as detailed below presents for evaluation.  Patient is accompanied by her daughter who provides majority of history.  Patient with approximately 24 hours of increased mild confusion.  Patient with fever noted on arrival.  Patient with cloudy foul-smelling urine per daughter.  Patient denies any pain.  She denies shortness of breath.  No reported upper respiratory symptoms.  The history is provided by the patient and a relative.  Altered Mental Status Presenting symptoms: confusion   Severity:  Mild Most recent episode:  Yesterday Episode history:  Single Duration:  1 day Timing:  Constant Progression:  Worsening Chronicity:  New Associated symptoms: fever   Fall      Past Medical History:  Diagnosis Date   anal ca dx'd 11/2011   squamous cell carcinoma   Anxiety    Arthritis 2022   Depression    Full dentures    Hemorrhoids    external   History of radiation therapy 11/29/11 to 01/05/12   anal canal 5040 cGy 28 sessions, regional lymph nodes 4200 cGy 28 sessions   Hyperlipidemia    Rectal bleeding    Vitamin D deficiency    Wears glasses     Patient Active Problem List   Diagnosis Date Noted   S/P total knee arthroplasty, right 06/09/2021   Fever    Staphylococcus aureus bacteremia with sepsis ([place])    SIRS (systemic inflammatory response syndrome) ([place]) 11/30/2020   Lumbar radiculopathy 10/10/2019   Bilateral stenosis of lateral recess of lumbar spine 10/10/2019   Hemorrhoids, external without complications 0000000   Depression    Anxiety    Rectal bleeding    History of radiation therapy    Malignant neoplasm of anal canal ([place]) 11/15/2011   Hemorrhoids, internal, with  bleeding 09/07/2011   Acquired anal stenosis 09/07/2011   Hyperlipidemia 09/07/2011   Family history of breast cancer in sister 09/07/2011    Past Surgical History:  Procedure Laterality Date   ABDOMINAL HYSTERECTOMY  1978   age 34   [place]  12/04/2020   Procedure: BUBBLE STUDY;  Surgeon: [name], MD;  Location: [facility];  Service: Cardiology;;   EXAMINATION UNDER ANESTHESIA  06/21/2012   Procedure: EXAM UNDER ANESTHESIA;  Surgeon: [name], MD;  Location: [facility];  Service: General;  Laterality: N/A;  rectal exam under anesthesia, anal dilation, rectal biopsy   [place]   Patient had to guess on the date.   RECTAL BIOPSY  06/21/2012   Procedure: BIOPSY RECTAL;  Surgeon: [name], MD;  Location: [facility];  Service: General;  Laterality: N/A;   TEE WITHOUT CARDIOVERSION N/A 12/04/2020   Procedure: TRANSESOPHAGEAL ECHOCARDIOGRAM (TEE);  Surgeon: [name], MD;  Location: [facility];  Service: Cardiology;  Laterality: N/A;   TOTAL KNEE ARTHROPLASTY Right 06/09/2021   Procedure: RIGHT TOTAL KNEE ARTHROPLASTY;  Surgeon: Meredith Pel, MD;  Location: [facility];  Service: Orthopedics;  Laterality: Right;   TUBAL LIGATION     b/l  age 8     OB History     Gravida  1   Para  1   Term  Preterm      AB      Living         SAB      IAB      Ectopic      Multiple      Live Births              Family History  Problem Relation Age of Onset   Cancer Sister        breast/ age 52   Cervical cancer Sister        56   Alcohol abuse Maternal Uncle     Social History   Tobacco Use   Smoking status: Former    Packs/day: 0.50    Years: 25.00    Pack years: 12.50    Types: Cigarettes    Quit date: 09/07/1987    Years since quitting: 33.8   Smokeless tobacco: Never  Vaping Use   Vaping Use: Never used  Substance Use Topics   Alcohol use: No   Drug use: No    Home  Medications Prior to Admission medications   Medication Sig Start Date End Date Taking? Authorizing Provider  methocarbamol (ROBAXIN) 500 MG tablet Take 1 tablet (500 mg total) by mouth every 6 (six) hours as needed for muscle spasms. 06/13/21  Yes Meredith Pel, MD  pravastatin (PRAVACHOL) 40 MG tablet Take 40 mg by mouth daily.   Yes [provider]  traZODone (DESYREL) 100 MG tablet Take 200 mg by mouth at bedtime.   Yes [provider]  acetaminophen (TYLENOL) 650 MG CR tablet Take 650 mg by mouth every 8 (eight) hours as needed for pain.    [provider]  ARIPiprazole (ABILIFY) 10 MG tablet Take 10 mg by mouth in the morning. 11/23/18   [provider]  aspirin 81 MG chewable tablet Chew 1 tablet (81 mg total) by mouth 2 (two) times daily. 06/13/21   Meredith Pel, MD  Calcium Carb-Cholecalciferol (CALCIUM 600+D3 PO) Take 1 tablet by mouth in the morning.    [provider]  celecoxib (CELEBREX) 100 MG capsule Take 1 capsule (100 mg total) by mouth in the morning. 06/13/21   Meredith Pel, MD  cholecalciferol (VITAMIN D) 25 MCG (1000 UNIT) tablet Take 1,000 Units by mouth in the morning.    [provider]  DULoxetine (CYMBALTA) 60 MG capsule Take 120 mg by mouth in the morning.    [provider]  gabapentin (NEURONTIN) 600 MG tablet Take 600 mg by mouth 2 (two) times daily. 07/14/16   [provider]  HYDROcodone-acetaminophen (NORCO/VICODIN) 5-325 MG tablet Take 1-2 tablets by mouth every 4 (four) hours as needed for severe pain. 06/13/21   Meredith Pel, MD  levothyroxine (SYNTHROID, LEVOTHROID) 25 MCG tablet Take 25 mcg by mouth daily before breakfast. 08/29/18   [provider]  Multiple Vitamin (MULTIVITAMIN WITH MINERALS) TABS tablet Take 1 tablet by mouth in the morning. Centrum Silver for Women    [provider]  vitamin B-12 (CYANOCOBALAMIN) 500 MCG tablet Take 500 mcg by  mouth in the morning.    [provider]    Allergies    Patient has no known allergies.  Review of Systems   Review of Systems  Constitutional:  Positive for fatigue and fever.  Psychiatric/Behavioral:  Positive for confusion.   All other systems reviewed and are negative.  Physical Exam Updated Vital Signs BP (!) 111/48   Pulse 96  Temp (!) 102.1 F (38.9 C)   Resp 20   SpO2 96%   Physical Exam Vitals and nursing note reviewed.  Constitutional:      General: She is not in acute distress.    Appearance: Normal appearance. She is well-developed.  HENT:     Head: Normocephalic and atraumatic.  Eyes:     Conjunctiva/sclera: Conjunctivae normal.     Pupils: Pupils are equal, round, and reactive to light.  Cardiovascular:     Rate and Rhythm: Normal rate and regular rhythm.     Heart sounds: Normal heart sounds.  Pulmonary:     Effort: Pulmonary effort is normal. No respiratory distress.     Breath sounds: Normal breath sounds.  Abdominal:     General: There is no distension.     Palpations: Abdomen is soft.     Tenderness: There is no abdominal tenderness.  Musculoskeletal:        General: No deformity. Normal range of motion.     Cervical back: Normal range of motion and neck supple.  Skin:    General: Skin is warm and dry.     Comments: Well-healed incision to the anterior aspect of the right knee.  Neurological:     General: No focal deficit present.     Mental Status: She is alert and oriented to person, place, and time.    ED Results / Procedures / Treatments   Labs (all labs ordered are listed, but only abnormal results are displayed) Labs Reviewed  CBC WITH DIFFERENTIAL/PLATELET - Abnormal; Notable for the following components:      Result Value   RBC 3.22 (*)    Hemoglobin 10.5 (*)    HCT 33.9 (*)    MCV 105.3 (*)    All other components within normal limits  LACTIC ACID, PLASMA - Abnormal; Notable for the following components:   Lactic  Acid, Venous 3.8 (*)    All other components within normal limits  LACTIC ACID, PLASMA - Abnormal; Notable for the following components:   Lactic Acid, Venous 4.4 (*)    All other components within normal limits  RESP PANEL BY RT-PCR (FLU A&B, COVID) ARPGX2  CULTURE, BLOOD (ROUTINE X 2)  CULTURE, BLOOD (ROUTINE X 2)  URINE CULTURE  COMPREHENSIVE METABOLIC PANEL  PROTIME-INR  URINALYSIS, ROUTINE W REFLEX MICROSCOPIC  TROPONIN I (HIGH SENSITIVITY)  TROPONIN I (HIGH SENSITIVITY)    EKG EKG Interpretation  Date/Time:  Thursday June 25 2021 15:41:56 EDT Ventricular Rate:  99 PR Interval:  138 QRS Duration: 88 QT Interval:  354 QTC Calculation: 455 R Axis:   -19 Text Interpretation: Sinus rhythm Borderline left axis deviation Low voltage, precordial leads Borderline T abnormalities, anterior leads Confirmed by Dene Gentry [phone]) on 06/25/2021 4:12:24 PM  Radiology CT Head Wo Contrast  Result Date: 06/25/2021 CLINICAL DATA:  Altered mental status EXAM: CT HEAD WITHOUT CONTRAST TECHNIQUE: Contiguous axial images were obtained from the base of the skull through the vertex without intravenous contrast. COMPARISON:  CT head 11/30/2020 FINDINGS: Brain: There is no evidence of acute intracranial hemorrhage, extra-axial fluid collection, or infarct. The ventricles are stable in size. There is no mass lesion. There is no midline shift. A remote lacunar infarct in the left external capsule is unchanged. Vascular: No hyperdense vessel or unexpected calcification. Skull: Normal. Negative for fracture or focal lesion. Sinuses/Orbits: The imaged paranasal sinuses are clear. The imaged globes and orbits are unremarkable. Other: None. IMPRESSION: No acute intracranial pathology. Electronically Signed  By: Valetta Mole M.D.   On: 06/25/2021 16:52   DG Chest Port 1 View  Result Date: 06/25/2021 CLINICAL DATA:  Fever, increased confusion, multiple falls EXAM: PORTABLE CHEST 1 VIEW COMPARISON:  None.  FINDINGS: The heart size and mediastinal contours are within normal limits. Both lungs are clear. The visualized skeletal structures are unremarkable. IMPRESSION: No active disease. Electronically Signed   By: Randa Ngo M.D.   On: 06/25/2021 15:53   XR Knee 1-2 Views Right  Result Date: 06/24/2021 AP lateral radiographs right knee reviewed.  Cemented total knee prosthesis in good position and alignment with no complicating features.   Procedures Procedures   Medications Ordered in ED Medications  sodium chloride 0.9 % bolus 500 mL (has no administration in time range)  vancomycin (VANCOCIN) IVPB 1000 mg/200 mL premix (has no administration in time range)  ceFEPIme (MAXIPIME) 2 g in sodium chloride 0.9 % 100 mL IVPB (has no administration in time range)  sodium chloride 0.9 % bolus 500 mL (500 mLs Intravenous New Bag/Given 06/25/21 1652)  acetaminophen (TYLENOL) tablet 1,000 mg (1,000 mg Oral Given 06/25/21 1647)    ED Course  I have reviewed the triage vital signs and the nursing notes.  Pertinent labs & imaging results that were available during my care of the patient were reviewed by me and considered in my medical decision making (see chart for details).    MDM Rules/Calculators/A&P                           MDM  MSE complete  TYNIA LOSACCO was evaluated in Emergency Department on 06/25/2021 for the symptoms described in the history of present illness. She was evaluated in the context of the global COVID-19 pandemic, which necessitated consideration that the patient might be at risk for infection with the SARS-CoV-2 virus that causes COVID-19. Institutional protocols and algorithms that pertain to the evaluation of patients at risk for COVID-19 are in a state of rapid change based on information released by regulatory bodies including the CDC and federal and state organizations. These policies and algorithms were followed during the patient's care in the ED.   Patient  presented with mild increase in confusion over the last 24 hours.  Patient with noted fever on arrival to the ED.  Broad-spectrum antibiotics initiated in the ED. Cultures obtained.   Patient source of fever is unclear after initial ED work-up.  Given reported foul-smelling urine suspect possible UTI.   Patient would benefit from admission for further work-up and treatment.  Hospitalist service is aware of case and will evaluate for same.  Final Clinical Impression(s) / ED Diagnoses Final diagnoses:  Fever, unspecified fever cause    Rx / DC Orders ED Discharge Orders     None        Valarie Merino, MD 06/25/21 [phone]

## 2021-06-25 NOTE — ED Notes (Signed)
Patient to CT.

## 2021-06-25 NOTE — Progress Notes (Signed)
A consult was received from an ED physician for cefepime and vancomycin per pharmacy dosing.  The patient's profile has been reviewed for ht/wt/allergies/indication/available labs.    A one time order has been placed for vancomycin 1gm and cefepime 2gm IV x1.  Further antibiotics/pharmacy consults should be ordered by admitting physician if indicated.                       Thank you, Lynelle Doctor 06/25/2021  5:10 PM

## 2021-06-25 NOTE — ED Triage Notes (Signed)
Pt daughter reports the pt has had several falls since last night and appears more confused than normal. Daughter reports the pt called her in the middle of the night after first fall. Pt had right knee replacement on 8/9, released from rehab on 8/21. Also reports urine had strong odor.

## 2021-06-25 NOTE — H&P (Signed)
History and Physical    [name] [id number] DOB: 1946-02-16 DOA: 06/25/2021  PCP: Tiffany Stains, MD  Patient coming from: Home  I have personally briefly reviewed patient's old medical records in [place]  Chief Complaint: confusion  HPI: [name] is a 75 y.o. female with medical history significant for remote history of anal cancer stage IIIa, hyperlipidemia, MSSA bacteremia 11/2020, depression and a recent right knee arthroplasty who presents with concerns of increasing confusion.  Daughter who provided most of the history today ER physician was not at bedside.  Patient not able to provide a good timeline of her symptoms.  States she has been feeling dizzy.  Initially when asked if she had a fall she said no.  However later reports that she had a fall yesterday while getting out of bed due to lightheadedness.  She lives alone.  She recently had right knee arthroplasty on 8/9 and was in rehab.  Reports that she returned home a week ago and was doing well.  She denies any headache.  No chest pain or shortness of breath.  No nausea, vomiting, diarrhea or abdominal pain.  Denies any knee pain or weakness of the knee.  She reports dysuria and has feeling of urinary urgency.  She continued asked if she can pee while in the ER room with purewick catheter in place but no urine was produced.   ED Course: She was febrile up to 102.8, normotensive, tachycardic on room air.  No leukocytosis.  Hemoglobin 10.5 from baseline of 11.  Lactate initially of 3.8 and increased to 4.4.  CMP was negative.  CT head negative.  Chest x-ray negative.  COVID and flu PCR negative.  Urine was pending at time of admission.  She was given 1 L normal saline bolus and initially started on IV vancomycin and cefepime for sepsis although cause likely to be UTI.  Hospitalist on-call for admission.  I was able to reach daughter by phone later and she suspects that her mother had a total of 3 falls.   States she was told by patient that last night while getting out of bed she fell enough to where she found a broken table.  She then went to the bathroom and states she hit her head on the toilet.  A neighbor later checked on her earlier today and saw her walker turned over and suspect she had a third fall.  The neighbor was a CNA and also reports some foul-smelling urine.  She apparently just got out of rehab about 4 days ago rather than a week ago as patient states.  Review of Systems: Pertinent positive and negatives as above.  Patient otherwise confused and was slow to come up with answers  Past Medical History:  Diagnosis Date   anal ca dx'd 11/2011   squamous cell carcinoma   Anxiety    Arthritis 2022   Depression    Full dentures    Hemorrhoids    external   History of radiation therapy 11/29/11 to 01/05/12   anal canal 5040 cGy 28 sessions, regional lymph nodes 4200 cGy 28 sessions   Hyperlipidemia    Rectal bleeding    Vitamin D deficiency    Wears glasses     Past Surgical History:  Procedure Laterality Date   ABDOMINAL HYSTERECTOMY  1978   age 70   BUBBLE STUDY  12/04/2020   Procedure: BUBBLE STUDY;  Surgeon: [name], MD;  Location: [facility];  Service: Cardiology;;  EXAMINATION UNDER ANESTHESIA  06/21/2012   Procedure: EXAM UNDER ANESTHESIA;  Surgeon: [name], MD;  Location: [facility];  Service: General;  Laterality: N/A;  rectal exam under anesthesia, anal dilation, rectal biopsy   [place]   Patient had to guess on the date.   RECTAL BIOPSY  06/21/2012   Procedure: BIOPSY RECTAL;  Surgeon: [name], MD;  Location: [facility];  Service: General;  Laterality: N/A;   TEE WITHOUT CARDIOVERSION N/A 12/04/2020   Procedure: TRANSESOPHAGEAL ECHOCARDIOGRAM (TEE);  Surgeon: [name], MD;  Location: [facility];  Service: Cardiology;  Laterality: N/A;   TOTAL KNEE ARTHROPLASTY Right 06/09/2021    Procedure: RIGHT TOTAL KNEE ARTHROPLASTY;  Surgeon: Meredith Pel, MD;  Location: [facility];  Service: Orthopedics;  Laterality: Right;   TUBAL LIGATION     b/l  age 39     reports that she quit smoking about 33 years ago. Her smoking use included cigarettes. She has a 12.50 pack-year smoking history. She has never used smokeless tobacco. She reports that she does not drink alcohol and does not use drugs. Social History  No Known Allergies  Family History  Problem Relation Age of Onset   Cancer Sister        breast/ age 48   Cervical cancer Sister        65   Alcohol abuse Maternal Uncle      Prior to Admission medications   Medication Sig Start Date End Date Taking? Authorizing Provider  acetaminophen (TYLENOL) 650 MG CR tablet Take 650 mg by mouth every 8 (eight) hours as needed for pain.   Yes [provider]  ARIPiprazole (ABILIFY) 10 MG tablet Take 10 mg by mouth in the morning. 11/23/18  Yes [provider]  aspirin 81 MG chewable tablet Chew 1 tablet (81 mg total) by mouth 2 (two) times daily. Patient taking differently: Chew 81 mg by mouth in the morning. 06/13/21  Yes Meredith Pel, MD  Calcium Carb-Cholecalciferol (CALCIUM 600+D3 PO) Take 1 tablet by mouth in the morning.   Yes [provider]  cholecalciferol (VITAMIN D) 25 MCG (1000 UNIT) tablet Take 1,000 Units by mouth in the morning.   Yes [provider]  DULoxetine (CYMBALTA) 60 MG capsule Take 60 mg by mouth 2 (two) times daily.   Yes [provider]  gabapentin (NEURONTIN) 600 MG tablet Take 600 mg by mouth 2 (two) times daily. 07/14/16  Yes [provider]  levothyroxine (SYNTHROID, LEVOTHROID) 25 MCG tablet Take 25 mcg by mouth daily before breakfast. 08/29/18  Yes [provider]  methocarbamol (ROBAXIN) 500 MG tablet Take 1 tablet (500 mg total) by mouth every 6 (six) hours as needed for muscle spasms. 06/13/21  Yes Meredith Pel, MD   Multiple Vitamin (MULTIVITAMIN WITH MINERALS) TABS tablet Take 1 tablet by mouth in the morning. Centrum Silver for Women   Yes [provider]  pravastatin (PRAVACHOL) 40 MG tablet Take 40 mg by mouth daily.   Yes [provider]  traZODone (DESYREL) 100 MG tablet Take 100 mg by mouth at bedtime.   Yes [provider]  vitamin B-12 (CYANOCOBALAMIN) 500 MCG tablet Take 500 mcg by mouth in the morning.   Yes [provider]  celecoxib (CELEBREX) 100 MG capsule Take 1 capsule (100 mg total) by mouth in the morning. Patient taking differently: Take 100 mg by mouth daily as needed for mild pain.  06/13/21   Meredith Pel, MD  HYDROcodone-acetaminophen (NORCO/VICODIN) 5-325 MG tablet Take 1-2 tablets by mouth every 4 (four) hours as needed for severe pain. 06/13/21   Meredith Pel, MD    Physical Exam: Vitals:   06/25/21 1715 06/25/21 1733 06/25/21 1745 06/25/21 1825  BP: (!) 124/53  (!) 111/48 (!) 114/55  Pulse: 89  96 91  Resp: '19  20 18  '$ Temp:  (!) 102.1 F (38.9 C)  100 F (37.8 C)  TempSrc:    Oral  SpO2: 97%  96% 96%    Constitutional: NAD, calm, comfortable nontoxic well-appearing elderly female laying at 30 degree incline in bed Vitals:   06/25/21 1715 06/25/21 1733 06/25/21 1745 06/25/21 1825  BP: (!) 124/53  (!) 111/48 (!) 114/55  Pulse: 89  96 91  Resp: '19  20 18  '$ Temp:  (!) 102.1 F (38.9 C)  100 F (37.8 C)  TempSrc:    Oral  SpO2: 97%  96% 96%   Eyes: PERRL, lids and conjunctivae normal ENMT: Mucous membranes are moist.  Neck: normal, supple Respiratory: clear to auscultation bilaterally, no wheezing, no crackles. Normal respiratory effort. No accessory muscle use.  Cardiovascular: Regular rate and rhythm, no murmurs / rubs / gallops. No extremity edema.  Abdomen: no tenderness, no masses palpated.  Bowel sounds positive.  Musculoskeletal: no clubbing / cyanosis. No joint deformity upper and lower extremities. Good ROM,  no contractures. Normal muscle tone.  Skin: Well-healed right knee midline scar with Steri-Strips.  neurologic: CN 2-12 grossly intact. Sensation intact, Strength 5/5 in all 4.  Patient slow to answer questions. Psychiatric: Normal judgment and insight. Alert and oriented to self place but not time. Normal mood.     Labs on Admission: I have personally reviewed following labs and imaging studies  CBC: Recent Labs  Lab 06/25/21 1615  WBC 8.5  NEUTROABS 6.8  HGB 10.5*  HCT 33.9*  MCV 105.3*  PLT [id number]   Basic Metabolic Panel: Recent Labs  Lab 06/25/21 1615  NA 139  K 4.0  CL 103  CO2 27  GLUCOSE 94  BUN 11  CREATININE 0.72  CALCIUM 9.3   GFR: CrCl cannot be calculated (Unknown ideal weight.). Liver Function Tests: Recent Labs  Lab 06/25/21 1615  AST 22  ALT 14  ALKPHOS 76  BILITOT 0.7  PROT 7.3  ALBUMIN 3.8   No results for input(s): LIPASE, AMYLASE in the last 168 hours. No results for input(s): AMMONIA in the last 168 hours. Coagulation Profile: Recent Labs  Lab 06/25/21 1615  INR 1.1   Cardiac Enzymes: No results for input(s): CKTOTAL, CKMB, CKMBINDEX, TROPONINI in the last 168 hours. BNP (last 3 results) No results for input(s): PROBNP in the last 8760 hours. HbA1C: No results for input(s): HGBA1C in the last 72 hours. CBG: No results for input(s): GLUCAP in the last 168 hours. Lipid Profile: No results for input(s): CHOL, HDL, LDLCALC, TRIG, CHOLHDL, LDLDIRECT in the last 72 hours. Thyroid Function Tests: No results for input(s): TSH, T4TOTAL, FREET4, T3FREE, THYROIDAB in the last 72 hours. Anemia Panel: No results for input(s): VITAMINB12, FOLATE, FERRITIN, TIBC, IRON, RETICCTPCT in the last 72 hours. Urine analysis:    Component Value Date/Time   COLORURINE YELLOW 06/03/2021 [place] 06/03/2021 0950   LABSPEC 1.010 06/03/2021 0950   PHURINE 5.5 06/03/2021 [place] 06/03/2021 0950   HGBUR NEGATIVE  06/03/2021 0950   BILIRUBINUR NEGATIVE 06/03/2021 0950  KETONESUR NEGATIVE 06/03/2021 0950   PROTEINUR NEGATIVE 06/03/2021 0950   UROBILINOGEN 0.2 11/08/2011 1445   NITRITE NEGATIVE 06/03/2021 0950   LEUKOCYTESUR NEGATIVE 06/03/2021 0950    Radiological Exams on Admission: CT Head Wo Contrast  Result Date: 06/25/2021 CLINICAL DATA:  Altered mental status EXAM: CT HEAD WITHOUT CONTRAST TECHNIQUE: Contiguous axial images were obtained from the base of the skull through the vertex without intravenous contrast. COMPARISON:  CT head 11/30/2020 FINDINGS: Brain: There is no evidence of acute intracranial hemorrhage, extra-axial fluid collection, or infarct. The ventricles are stable in size. There is no mass lesion. There is no midline shift. A remote lacunar infarct in the left external capsule is unchanged. Vascular: No hyperdense vessel or unexpected calcification. Skull: Normal. Negative for fracture or focal lesion. Sinuses/Orbits: The imaged paranasal sinuses are clear. The imaged globes and orbits are unremarkable. Other: None. IMPRESSION: No acute intracranial pathology. Electronically Signed   By: Valetta Mole M.D.   On: 06/25/2021 16:52   DG Chest Port 1 View  Result Date: 06/25/2021 CLINICAL DATA:  Fever, increased confusion, multiple falls EXAM: PORTABLE CHEST 1 VIEW COMPARISON:  None. FINDINGS: The heart size and mediastinal contours are within normal limits. Both lungs are clear. The visualized skeletal structures are unremarkable. IMPRESSION: No active disease. Electronically Signed   By: Randa Ngo M.D.   On: 06/25/2021 15:53   XR Knee 1-2 Views Right  Result Date: 06/24/2021 AP lateral radiographs right knee reviewed.  Cemented total knee prosthesis in good position and alignment with no complicating features.     Assessment/Plan  Severe sepsis secondary to UTI -Patient presented with fever, tachycardia - UA pending at time of admission but has symptom of dysuria and  urinary urgency.  Has already received IV vancomycin and cefepime in the ED.  Will switch to IV Rocephin pending final UA results -Obtain bladder scan since she has urinary urgency but has not been able to produce urine -Continuous IV 75 cc normal saline fluid  Acute metabolic encephalopathy likely secondary to UTI - UA pending at time mission but she endorses dysuria and urinary urgency - Treatment as above  Recent right knee arthroplasty - Surgery on 8/9 with orthopedic Dr. Marcene Duos -She just had follow-up yesterday on 8/24 with AP lateral radiograph showing knee prosthesis in good position alignment with no complicating features -Continue aspirin for DVT prophylaxis  Depression Continue Abilify and Cymbalta  Hypothyroidism - Continue Synthroid    DVT prophylaxis:.Lovenox Code Status: Full Family Communication: Plan discussed with patient at bedside and daughter April over the phone disposition Plan: Home with observation Consults called:  Admission status: Observation  Level of care: Med-Surg  Status is: Observation  The patient remains OBS appropriate and will d/c before 2 midnights.  Dispo: The patient is from: Home              Anticipated d/c is to: Home              Patient currently is not medically stable to d/c.   Difficult to place patient No         Orene Desanctis DO [facility]   If 7PM-7AM, please contact night-coverage [email or web address]   06/25/2021, 7:08 PM

## 2021-06-26 ENCOUNTER — Encounter (HOSPITAL_COMMUNITY): Payer: Self-pay | Admitting: Family Medicine

## 2021-06-26 DIAGNOSIS — Z7982 Long term (current) use of aspirin: Secondary | ICD-10-CM | POA: Diagnosis not present

## 2021-06-26 DIAGNOSIS — Z79899 Other long term (current) drug therapy: Secondary | ICD-10-CM | POA: Diagnosis not present

## 2021-06-26 DIAGNOSIS — N390 Urinary tract infection, site not specified: Secondary | ICD-10-CM

## 2021-06-26 DIAGNOSIS — A419 Sepsis, unspecified organism: Secondary | ICD-10-CM | POA: Diagnosis present

## 2021-06-26 DIAGNOSIS — Z20822 Contact with and (suspected) exposure to covid-19: Secondary | ICD-10-CM | POA: Diagnosis present

## 2021-06-26 DIAGNOSIS — E785 Hyperlipidemia, unspecified: Secondary | ICD-10-CM | POA: Diagnosis present

## 2021-06-26 DIAGNOSIS — E039 Hypothyroidism, unspecified: Secondary | ICD-10-CM | POA: Diagnosis present

## 2021-06-26 DIAGNOSIS — F419 Anxiety disorder, unspecified: Secondary | ICD-10-CM | POA: Diagnosis present

## 2021-06-26 DIAGNOSIS — F32A Depression, unspecified: Secondary | ICD-10-CM | POA: Diagnosis present

## 2021-06-26 DIAGNOSIS — Z9851 Tubal ligation status: Secondary | ICD-10-CM | POA: Diagnosis not present

## 2021-06-26 DIAGNOSIS — Z87891 Personal history of nicotine dependence: Secondary | ICD-10-CM | POA: Diagnosis not present

## 2021-06-26 DIAGNOSIS — Z972 Presence of dental prosthetic device (complete) (partial): Secondary | ICD-10-CM | POA: Diagnosis not present

## 2021-06-26 DIAGNOSIS — Z923 Personal history of irradiation: Secondary | ICD-10-CM | POA: Diagnosis not present

## 2021-06-26 DIAGNOSIS — E559 Vitamin D deficiency, unspecified: Secondary | ICD-10-CM | POA: Diagnosis present

## 2021-06-26 DIAGNOSIS — R6520 Severe sepsis without septic shock: Secondary | ICD-10-CM | POA: Diagnosis present

## 2021-06-26 DIAGNOSIS — Z85048 Personal history of other malignant neoplasm of rectum, rectosigmoid junction, and anus: Secondary | ICD-10-CM | POA: Diagnosis not present

## 2021-06-26 DIAGNOSIS — G9341 Metabolic encephalopathy: Secondary | ICD-10-CM | POA: Diagnosis present

## 2021-06-26 DIAGNOSIS — Z79890 Hormone replacement therapy (postmenopausal): Secondary | ICD-10-CM | POA: Diagnosis not present

## 2021-06-26 DIAGNOSIS — Z90710 Acquired absence of both cervix and uterus: Secondary | ICD-10-CM | POA: Diagnosis not present

## 2021-06-26 DIAGNOSIS — M1990 Unspecified osteoarthritis, unspecified site: Secondary | ICD-10-CM | POA: Diagnosis present

## 2021-06-26 HISTORY — DX: Urinary tract infection, site not specified: N39.0

## 2021-06-26 LAB — BASIC METABOLIC PANEL
Anion gap: 7 (ref 5–15)
BUN: 11 mg/dL (ref 8–23)
CO2: 27 mmol/L (ref 22–32)
Calcium: 8.7 mg/dL — ABNORMAL LOW (ref 8.9–10.3)
Chloride: 104 mmol/L (ref 98–111)
Creatinine, Ser: 0.78 mg/dL (ref 0.44–1.00)
GFR, Estimated: >60 mL/min (ref >60)
Glucose, Bld: 111 mg/dL — ABNORMAL HIGH (ref 70–99)
Potassium: 3.5 mmol/L (ref 3.5–5.1)
Sodium: 138 mmol/L (ref 135–145)

## 2021-06-26 MED ORDER — FOLIC ACID 1 MG PO TABS
1.0000 mg | ORAL_TABLET | Freq: Every day | ORAL | Status: DC
  Administered 2021-06-26 – 2021-06-28 (×3): 1 mg via ORAL
  Filled 2021-06-26 (×3): qty 1

## 2021-06-26 MED ORDER — VITAMIN D3 25 MCG (1000 UNIT) PO TABS
1000.0000 [IU] | ORAL_TABLET | Freq: Every morning | ORAL | Status: DC
  Administered 2021-06-26 – 2021-06-28 (×3): 1000 [IU] via ORAL
  Filled 2021-06-26 (×3): qty 1

## 2021-06-26 MED ORDER — ARIPIPRAZOLE 10 MG PO TABS
10.0000 mg | ORAL_TABLET | Freq: Every morning | ORAL | Status: DC
  Administered 2021-06-26 – 2021-06-28 (×3): 10 mg via ORAL
  Filled 2021-06-26 (×3): qty 1

## 2021-06-26 MED ORDER — DULOXETINE HCL 60 MG PO CPEP
60.0000 mg | ORAL_CAPSULE | Freq: Two times a day (BID) | ORAL | Status: DC
  Administered 2021-06-26 – 2021-06-28 (×6): 60 mg via ORAL
  Filled 2021-06-26 (×4): qty 1
  Filled 2021-06-26 (×2): qty 2

## 2021-06-26 MED ORDER — PRAVASTATIN SODIUM 20 MG PO TABS
40.0000 mg | ORAL_TABLET | Freq: Every day | ORAL | Status: DC
  Administered 2021-06-26 – 2021-06-28 (×3): 40 mg via ORAL
  Filled 2021-06-26 (×3): qty 2

## 2021-06-26 MED ORDER — GABAPENTIN 300 MG PO CAPS
300.0000 mg | ORAL_CAPSULE | Freq: Two times a day (BID) | ORAL | Status: DC
  Administered 2021-06-26 – 2021-06-28 (×4): 300 mg via ORAL
  Filled 2021-06-26 (×4): qty 1

## 2021-06-26 MED ORDER — CYANOCOBALAMIN 500 MCG PO TABS
500.0000 ug | ORAL_TABLET | Freq: Every morning | ORAL | Status: DC
  Administered 2021-06-26 – 2021-06-28 (×3): 500 ug via ORAL
  Filled 2021-06-26 (×3): qty 1

## 2021-06-26 MED ORDER — GABAPENTIN 300 MG PO CAPS
600.0000 mg | ORAL_CAPSULE | Freq: Two times a day (BID) | ORAL | Status: DC
  Administered 2021-06-26 (×2): 600 mg via ORAL
  Filled 2021-06-26 (×2): qty 2

## 2021-06-26 MED ORDER — SODIUM CHLORIDE 0.9 % IV SOLN: INTRAVENOUS | Status: AC

## 2021-06-26 MED ORDER — TRAZODONE HCL 100 MG PO TABS
100.0000 mg | ORAL_TABLET | Freq: Every day | ORAL | Status: DC
  Administered 2021-06-26 – 2021-06-27 (×3): 100 mg via ORAL
  Filled 2021-06-26 (×3): qty 1

## 2021-06-26 MED ORDER — ASPIRIN 81 MG PO CHEW
81.0000 mg | CHEWABLE_TABLET | Freq: Every morning | ORAL | Status: DC
  Administered 2021-06-26 – 2021-06-28 (×3): 81 mg via ORAL
  Filled 2021-06-26 (×3): qty 1

## 2021-06-26 MED ORDER — SODIUM CHLORIDE 0.9 % IV SOLN
1.0000 g | INTRAVENOUS | Status: DC
  Administered 2021-06-27: 1 g via INTRAVENOUS
  Filled 2021-06-26: qty 10
  Filled 2021-06-26: qty 1

## 2021-06-26 MED ORDER — ACETAMINOPHEN 325 MG PO TABS
650.0000 mg | ORAL_TABLET | Freq: Three times a day (TID) | ORAL | Status: DC | PRN
  Administered 2021-06-26 – 2021-06-27 (×3): 650 mg via ORAL
  Filled 2021-06-26 (×3): qty 2

## 2021-06-26 MED ORDER — LEVOTHYROXINE SODIUM 25 MCG PO TABS
25.0000 ug | ORAL_TABLET | Freq: Every day | ORAL | Status: DC
  Administered 2021-06-26 – 2021-06-28 (×3): 25 ug via ORAL
  Filled 2021-06-26 (×3): qty 1

## 2021-06-26 NOTE — Progress Notes (Signed)
Patient ID: [name], female   DOB: 09-07-1946, 75 y.o.   MRN: [id number]  PROGRESS NOTE    [name]  [id number] DOB: 29-Aug-1946 DOA: 06/25/2021 PCP: [name], MD   Brief Narrative:   75 y.o. female with medical history significant for remote history of anal cancer stage IIIa, hyperlipidemia, MSSA bacteremia 11/2020, depression and a recent right knee arthroplasty on 06/09/2021 presented with increasing confusion, dysuria and urgency.  On presentation, she was febrile to 102.8, tachycardic with no leukocytosis but lactate of 3.8 and 4.4 subsequently.  CT of the head was negative for acute intracranial abnormality.  COVID-19 and influenza testing were negative.  Chest x-ray was negative for infiltrates.  She was started on IV fluids and antibiotics for possible UTI.  Assessment & Plan:   Severe sepsis: Present on admission UTI: Present on admission -Presented with fever, tachycardia, lactic acidosis and acute metabolic encephalopathy and symptoms of UTI -UA also suggestive of UTI.  Still spiking temperatures.  We will move her to inpatient.  Continue Rocephin. -Continue IV fluids.  Follow cultures.  Acute metabolic encephalopathy -Possibly from above.  Still slow to respond and slightly confused.  Monitor mental status.  Fall precautions.  PT eval.  Recent right knee arthroplasty -By Dr. Marlou Sa on 06/09/2021 -She just had follow-up yesterday on 8/24 with AP lateral radiograph showing knee prosthesis in good position alignment with no complicating features -Continue aspirin for DVT prophylaxis  Depression -Continue Abilify and Cymbalta  Hypothyroidism -continue levothyroxine  Dyslipidemia -Continue statin  DVT prophylaxis: Lovenox Code Status: Full Family Communication: None at bedside Disposition Plan: Status is: Inpatient  Remains inpatient appropriate because:Inpatient level of care appropriate due to severity of illness  Dispo: The patient is from: Home               Anticipated d/c is to: Home              Patient currently is not medically stable to d/c.   Difficult to place patient No   Consultants: None  Procedures: None  Antimicrobials:  Anti-infectives (From admission, onward)    Start     Dose/Rate Route Frequency Ordered Stop   06/27/21 1000  cefTRIAXone (ROCEPHIN) 1 g in sodium chloride 0.9 % 100 mL IVPB        1 g 200 mL/hr over 30 Minutes Intravenous Every 24 hours 06/26/21 0011     06/25/21 1715  vancomycin (VANCOCIN) IVPB 1000 mg/200 mL premix        1,000 mg 200 mL/hr over 60 Minutes Intravenous  Once 06/25/21 1710 06/25/21 1922   06/25/21 1715  ceFEPIme (MAXIPIME) 2 g in sodium chloride 0.9 % 100 mL IVPB        2 g 200 mL/hr over 30 Minutes Intravenous  Once 06/25/21 1710 06/25/21 1856        Subjective: Patient seen and examined at bedside.  Still spiking temperatures.  Poor historian.  No vomiting, chest pain reported.  Objective: Vitals:   06/26/21 0129 06/26/21 0545 06/26/21 0616 06/26/21 0656  BP: (!) 151/68 131/64    Pulse: 88 80 95   Resp: 16 16    Temp: 98.3 F (36.8 C) (!) 101.9 F (38.8 C)  (!) 100.9 F (38.3 C)  TempSrc: Oral Oral  Oral  SpO2: 98% (!) 87% 93%   Weight:    76.7 kg  Height:    '5\' 2"'$  (1.575 m)    Intake/Output Summary (Last 24 hours) at 06/26/2021 [phone]  Last data filed at 06/26/2021 0600 Gross per 24 hour  Intake 1288.21 ml  Output 150 ml  Net 1138.21 ml   Filed Weights   06/26/21 0656  Weight: 76.7 kg    Examination:  General exam: Appears calm and comfortable.  Currently on room air. Respiratory system: Bilateral decreased breath sounds at bases Cardiovascular system: S1 & S2 heard, Rate controlled Gastrointestinal system: Abdomen is nondistended, soft and nontender. Normal bowel sounds heard. Extremities: No cyanosis, clubbing, edema  Central nervous system: Sleepy, wakes up slightly, extremely slow to respond.  Slightly confused.  No focal neurological deficits.  Moving extremities Skin: No rashes, lesions or ulcers.  Well-healed right knee midline scar with Steri-Strips Psychiatry: Could not be assessed because of her mental status   Data Reviewed: I have personally reviewed following labs and imaging studies  CBC: Recent Labs  Lab 06/25/21 1615  WBC 8.5  NEUTROABS 6.8  HGB 10.5*  HCT 33.9*  MCV 105.3*  PLT [id number]   Basic Metabolic Panel: Recent Labs  Lab 06/25/21 1615 06/26/21 0025  NA 139 138  K 4.0 3.5  CL 103 104  CO2 27 27  GLUCOSE 94 111*  BUN 11 11  CREATININE 0.72 0.78  CALCIUM 9.3 8.7*   GFR: Estimated Creatinine Clearance: 59.1 mL/min (by C-G formula based on SCr of 0.78 mg/dL). Liver Function Tests: Recent Labs  Lab 06/25/21 1615  AST 22  ALT 14  ALKPHOS 76  BILITOT 0.7  PROT 7.3  ALBUMIN 3.8   No results for input(s): LIPASE, AMYLASE in the last 168 hours. No results for input(s): AMMONIA in the last 168 hours. Coagulation Profile: Recent Labs  Lab 06/25/21 1615  INR 1.1   Cardiac Enzymes: No results for input(s): CKTOTAL, CKMB, CKMBINDEX, TROPONINI in the last 168 hours. BNP (last 3 results) No results for input(s): PROBNP in the last 8760 hours. HbA1C: No results for input(s): HGBA1C in the last 72 hours. CBG: No results for input(s): GLUCAP in the last 168 hours. Lipid Profile: No results for input(s): CHOL, HDL, LDLCALC, TRIG, CHOLHDL, LDLDIRECT in the last 72 hours. Thyroid Function Tests: No results for input(s): TSH, T4TOTAL, FREET4, T3FREE, THYROIDAB in the last 72 hours. Anemia Panel: No results for input(s): VITAMINB12, FOLATE, FERRITIN, TIBC, IRON, RETICCTPCT in the last 72 hours. Sepsis Labs: Recent Labs  Lab 06/25/21 1615 06/25/21 1707 06/25/21 1954 06/25/21 2226  LATICACIDVEN 3.8* 4.4* 0.9 1.7    Recent Results (from the past 240 hour(s))  Culture, blood (routine x 2)     Status: None (Preliminary result)   Collection Time: 06/25/21  4:15 PM   Specimen: BLOOD LEFT HAND   Result Value Ref Range Status   Specimen Description   Final    BLOOD LEFT HAND Performed at [facility] [address], [place], [place] [address]    Special Requests   Final    BOTTLES DRAWN AEROBIC AND ANAEROBIC Blood Culture results may not be optimal due to an inadequate volume of blood received in culture bottles Performed at [facility] [address], [place], [place] [address]    Culture   Final    NO GROWTH < 12 HOURS Performed at [facility] [address], [place], [place] [address]    Report Status PENDING  Incomplete  Resp Panel by RT-PCR (Flu A&B, Covid) Nasopharyngeal Swab     Status: None   Collection Time: 06/25/21  4:15 PM   Specimen: Nasopharyngeal Swab; Nasopharyngeal(NP)  swabs in vial transport medium  Result Value Ref Range Status   SARS Coronavirus 2 by RT PCR NEGATIVE NEGATIVE Final    Comment: (NOTE) SARS-CoV-2 target nucleic acids are NOT DETECTED.  The SARS-CoV-2 RNA is generally detectable in upper respiratory specimens during the acute phase of infection. The lowest concentration of SARS-CoV-2 viral copies this assay can detect is 138 copies/mL. A negative result does not preclude SARS-Cov-2 infection and should not be used as the sole basis for treatment or other patient management decisions. A negative result may occur with  improper specimen collection/handling, submission of specimen other than nasopharyngeal swab, presence of viral mutation(s) within the areas targeted by this assay, and inadequate number of viral copies(<138 copies/mL). A negative result must be combined with clinical observations, patient history, and epidemiological information. The expected result is Negative.  Fact Sheet for Patients:  EntrepreneurPulse.com.au  Fact Sheet for Healthcare Providers:  IncredibleEmployment.be  This test is no t yet approved or cleared by the [place]  FDA and  has been authorized for detection and/or diagnosis of SARS-CoV-2 by FDA under an Emergency Use Authorization (EUA). This EUA will remain  in effect (meaning this test can be used) for the duration of the COVID-19 declaration under Section 564(b)(1) of the Act, 21 U.S.C.section 360bbb-3(b)(1), unless the authorization is terminated  or revoked sooner.       Influenza A by PCR NEGATIVE NEGATIVE Final   Influenza B by PCR NEGATIVE NEGATIVE Final    Comment: (NOTE) The Xpert Xpress SARS-CoV-2/FLU/RSV plus assay is intended as an aid in the diagnosis of influenza from Nasopharyngeal swab specimens and should not be used as a sole basis for treatment. Nasal washings and aspirates are unacceptable for Xpert Xpress SARS-CoV-2/FLU/RSV testing.  Fact Sheet for Patients: EntrepreneurPulse.com.au  Fact Sheet for Healthcare Providers: IncredibleEmployment.be  This test is not yet approved or cleared by the [place] FDA and has been authorized for detection and/or diagnosis of SARS-CoV-2 by FDA under an Emergency Use Authorization (EUA). This EUA will remain in effect (meaning this test can be used) for the duration of the COVID-19 declaration under Section 564(b)(1) of the Act, 21 U.S.C. section 360bbb-3(b)(1), unless the authorization is terminated or revoked.  Performed at [facility], [place] [address], [place], [place] [address]   Culture, blood (routine x 2)     Status: None (Preliminary result)   Collection Time: 06/25/21  5:07 PM   Specimen: BLOOD  Result Value Ref Range Status   Specimen Description   Final    BLOOD RIGHT WRIST Performed at [facility] [address] [place], [place] [address]    Special Requests   Final    BOTTLES DRAWN AEROBIC AND ANAEROBIC Blood Culture results may not be optimal due to an inadequate volume of blood received in culture bottles Performed at [facility] [address], [place], [place] [address]    Culture   Final    NO GROWTH < 12 HOURS Performed at [facility] [address] [place], [place] [address]    Report Status PENDING  Incomplete         Radiology Studies: CT Head Wo Contrast  Result Date: 06/25/2021 CLINICAL DATA:  Altered mental status EXAM: CT HEAD WITHOUT CONTRAST TECHNIQUE: Contiguous axial images were obtained from the base of the skull through the vertex without intravenous contrast. COMPARISON:  CT head 11/30/2020 FINDINGS: Brain: There is no evidence of acute intracranial hemorrhage,  extra-axial fluid collection, or infarct. The ventricles are stable in size. There is no mass lesion. There is no midline shift. A remote lacunar infarct in the left external capsule is unchanged. Vascular: No hyperdense vessel or unexpected calcification. Skull: Normal. Negative for fracture or focal lesion. Sinuses/Orbits: The imaged paranasal sinuses are clear. The imaged globes and orbits are unremarkable. Other: None. IMPRESSION: No acute intracranial pathology. Electronically Signed   By: Valetta Mole M.D.   On: 06/25/2021 16:52   DG Chest Port 1 View  Result Date: 06/25/2021 CLINICAL DATA:  Fever, increased confusion, multiple falls EXAM: PORTABLE CHEST 1 VIEW COMPARISON:  None. FINDINGS: The heart size and mediastinal contours are within normal limits. Both lungs are clear. The visualized skeletal structures are unremarkable. IMPRESSION: No active disease. Electronically Signed   By: Randa Ngo M.D.   On: 06/25/2021 15:53   XR Knee 1-2 Views Right  Result Date: 06/24/2021 AP lateral radiographs right knee reviewed.  Cemented total knee prosthesis in good position and alignment with no complicating features.       Scheduled Meds:  ARIPiprazole  10 mg Oral q AM   aspirin  81 mg Oral q AM   cholecalciferol  1,000 Units Oral q AM   DULoxetine  60 mg Oral BID   enoxaparin (LOVENOX) injection  40  mg Subcutaneous Q24H   gabapentin  600 mg Oral BID   levothyroxine  25 mcg Oral Q0600   pravastatin  40 mg Oral Daily   traZODone  100 mg Oral QHS   vitamin B-12  500 mcg Oral q AM   Continuous Infusions:  sodium chloride     [START ON 06/27/2021] cefTRIAXone (ROCEPHIN)  IV            Aline August, MD [facility] 06/26/2021, 8:23 AM

## 2021-06-26 NOTE — Evaluation (Signed)
Physical Therapy Evaluation Patient Details Name: [name]: [id number] DOB: 25-Jul-1946 Today's Date: 06/26/2021   History of Present Illness  Pt is a 75 y.o. female admitted from home 2* AMS, UTI, and falls.  Pt s/p R TKA on 06/09/21 and home from SNF rehab less than one week. PMH includes lumbar radiculopathy, depression, anal CA.  Clinical Impression  Pt admitted as above and presenting with functional mobility limitations 2* decreased R LE strength/ROM, mild residual post op pain and questionable safety awareness.  Pt should progress to dc home with intermittent family assist (states she thinks dtr can stay over first night if released on the weekend) and continuation of prior HHPT.    Follow Up Recommendations Home health PT (Pt was recieving HHPT for R knee and would like to continue same.)    Equipment Recommendations  None recommended by PT    Recommendations for Other Services       Precautions / Restrictions Precautions Precautions: Fall;Knee Precaution Comments: Reviewed knee precautions with patient Restrictions Weight Bearing Restrictions: No RLE Weight Bearing: Weight bearing as tolerated      Mobility  Bed Mobility Overal bed mobility: Modified Independent             General bed mobility comments: No physical assist to move to EOB sitting    Transfers Overall transfer level: Needs assistance Equipment used: Rolling walker (2 wheeled) Transfers: Sit to/from Stand Sit to Stand: Supervision         General transfer comment: cues for use of UEs to self assist and for basic safety awareness  Ambulation/Gait Ambulation/Gait assistance: Min guard;Supervision Gait Distance (Feet): 200 Feet Assistive device: Rolling walker (2 wheeled) Gait Pattern/deviations: Step-through pattern;Shuffle;Trunk flexed Gait velocity: Decreased   General Gait Details: cues for posture, position from RW and safety awareness.  Pt mildly unsteady but with no overt  LOB  Stairs            Wheelchair Mobility    Modified Rankin (Stroke Patients Only)       Balance Overall balance assessment: Needs assistance Sitting-balance support: No upper extremity supported;Feet supported Sitting balance-Leahy Scale: Good     Standing balance support: No upper extremity supported Standing balance-Leahy Scale: Fair                               Pertinent Vitals/Pain Pain Assessment: 0-10 Pain Score: 5  Pain Location: R knee Pain Descriptors / Indicators: Aching;Sore Pain Intervention(s): Limited activity within patient's tolerance;Monitored during session    [place] expects to be discharged to:: Private residence Living Arrangements: Alone Available Help at Discharge: Available PRN/intermittently;Family Type of Home: House Home Access: Stairs to enter Entrance Stairs-Rails: Right Entrance Stairs-Number of Steps: 1 Home Layout: One level Home Equipment: [place] - 2 wheels;Shower seat      Prior Function Level of Independence: Independent with assistive device(s)         Comments: Pt using RW but admits to falling prior to this admit     Hand Dominance        Extremity/Trunk Assessment   Upper Extremity Assessment Upper Extremity Assessment: Overall WFL for tasks assessed    Lower Extremity Assessment Lower Extremity Assessment: RLE deficits/detail RLE Deficits / Details: AAROM R knee -4 - 100 with 3+/5 quads       Communication   Communication: No difficulties  Cognition Arousal/Alertness: Awake/alert Behavior During Therapy: Impulsive;WFL for tasks assessed/performed Overall  Cognitive Status: No family/caregiver present to determine baseline cognitive functioning Area of Impairment: Safety/judgement                     Memory: Decreased short-term memory;Decreased recall of precautions Following Commands: Follows one step commands consistently Safety/Judgement: Decreased  awareness of safety     General Comments: Pt mildly impulsive this session and requiring safety cues      General Comments      Exercises Total Joint Exercises Ankle Circles/Pumps: AROM;Both;20 reps;Supine Quad Sets: AROM;Right;10 reps;Supine Heel Slides: Right;Supine;AROM;20 reps Hip ABduction/ADduction: Right;10 reps;Supine;AROM Straight Leg Raises: Right;Supine;AROM;20 reps Long Arc Quad: AROM;Right;Seated;10 reps   Assessment/Plan    PT Assessment Patient needs continued PT services  PT Problem List Decreased strength;Decreased range of motion;Decreased activity tolerance;Decreased balance;Decreased mobility;Decreased knowledge of use of DME;Decreased safety awareness;Pain       PT Treatment Interventions DME instruction;Gait training;Stair training;Functional mobility training;Therapeutic activities;Therapeutic exercise;Balance training;Patient/family education    PT Goals (Current goals can be found in the Care Plan section)  Acute Rehab PT Goals Patient Stated Goal: to get home PT Goal Formulation: With patient Time For Goal Achievement: 07/03/21 Potential to Achieve Goals: Good    Frequency Min 3X/week   Barriers to discharge Decreased caregiver support Pt states lives alone and dtr is limited in amount of support she can provide but may be able to stay with her over the weekend if pt is released soon.    Co-evaluation               AM-PAC PT "6 Clicks" Mobility  Outcome Measure Help needed turning from your back to your side while in a flat bed without using bedrails?: None Help needed moving from lying on your back to sitting on the side of a flat bed without using bedrails?: None Help needed moving to and from a bed to a chair (including a wheelchair)?: A Little Help needed standing up from a chair using your arms (e.g., wheelchair or bedside chair)?: A Little Help needed to walk in hospital room?: A Little Help needed climbing 3-5 steps with a  railing? : A Little 6 Click Score: 20    End of Session Equipment Utilized During Treatment: Gait belt Activity Tolerance: Patient tolerated treatment well Patient left: in chair;with call bell/phone within reach;with chair alarm set Nurse Communication: Mobility status PT Visit Diagnosis: History of falling (Z91.81);Difficulty in walking, not elsewhere classified (R26.2);Pain Pain - Right/Left: Right Pain - part of body: Knee    Time: 1103-1140 PT Time Calculation (min) (ACUTE ONLY): 37 min   Charges:   PT Evaluation $PT Eval Low Complexity: 1 Low PT Treatments $Therapeutic Exercise: 8-22 mins        Debe Coder PT Acute Rehabilitation Services Pager [phone] Office [phone]   Luann Aspinwall 06/26/2021, 12:15 PM

## 2021-06-26 NOTE — Progress Notes (Signed)
Pt has a temp of 102.2 this evening, MD notified, will cont to monitor.

## 2021-06-26 NOTE — Evaluation (Signed)
SLP Cancellation Note  Patient Details Name: TORRIE ULCH MRN: [id number] DOB: [birth year]   Cancelled treatment:       Reason Eval/Treat Not Completed: Other (comment) (Per RN, pt is not demonstrating any issues with swallowing.  Await clarification from MD to RN for indication of swallow evaluation.)  Kathleen Lime, MS [place] Office [phone] Pager [phone]   Macario Golds 06/26/2021, 3:26 PM

## 2021-06-26 NOTE — Progress Notes (Signed)
Patient with fever on and off since arriving to ED.  Tylenol prn given. Charge nurse notified.  MEWS protocol followed.

## 2021-06-27 DIAGNOSIS — M1711 Unilateral primary osteoarthritis, right knee: Secondary | ICD-10-CM

## 2021-06-27 LAB — CBC WITH DIFFERENTIAL/PLATELET
Abs Immature Granulocytes: 0.01 10*3/uL (ref 0.00–0.07)
Basophils Absolute: 0 10*3/uL (ref 0.0–0.1)
Basophils Relative: 1 %
Eosinophils Absolute: 0.1 10*3/uL (ref 0.0–0.5)
Eosinophils Relative: 3 %
HCT: 28.7 % — ABNORMAL LOW (ref 36.0–46.0)
Hemoglobin: 9 g/dL — ABNORMAL LOW (ref 12.0–15.0)
Immature Granulocytes: 0 %
Lymphocytes Relative: 15 %
Lymphs Abs: 0.5 10*3/uL — ABNORMAL LOW (ref 0.7–4.0)
MCH: 32.1 pg (ref 26.0–34.0)
MCHC: 31.4 g/dL (ref 30.0–36.0)
MCV: 102.5 fL — ABNORMAL HIGH (ref 80.0–100.0)
Monocytes Absolute: 0.3 10*3/uL (ref 0.1–1.0)
Monocytes Relative: 10 %
Neutro Abs: 2.4 10*3/uL (ref 1.7–7.7)
Neutrophils Relative %: 71 %
Platelets: 267 10*3/uL (ref 150–400)
RBC: 2.8 MIL/uL — ABNORMAL LOW (ref 3.87–5.11)
RDW: 13.3 % (ref 11.5–15.5)
WBC: 3.3 10*3/uL — ABNORMAL LOW (ref 4.0–10.5)
nRBC: 0 % (ref 0.0–0.2)

## 2021-06-27 LAB — MAGNESIUM: Magnesium: 2.1 mg/dL (ref 1.7–2.4)

## 2021-06-27 LAB — COMPREHENSIVE METABOLIC PANEL
ALT: 15 U/L (ref 0–44)
AST: 19 U/L (ref 15–41)
Albumin: 2.9 g/dL — ABNORMAL LOW (ref 3.5–5.0)
Alkaline Phosphatase: 62 U/L (ref 38–126)
Anion gap: 9 (ref 5–15)
BUN: 12 mg/dL (ref 8–23)
CO2: 27 mmol/L (ref 22–32)
Calcium: 8.5 mg/dL — ABNORMAL LOW (ref 8.9–10.3)
Chloride: 106 mmol/L (ref 98–111)
Creatinine, Ser: 0.56 mg/dL (ref 0.44–1.00)
GFR, Estimated: >60 mL/min (ref >60)
Glucose, Bld: 104 mg/dL — ABNORMAL HIGH (ref 70–99)
Potassium: 3.7 mmol/L (ref 3.5–5.1)
Sodium: 142 mmol/L (ref 135–145)
Total Bilirubin: 0.4 mg/dL (ref 0.3–1.2)
Total Protein: 6 g/dL — ABNORMAL LOW (ref 6.5–8.1)

## 2021-06-27 LAB — AMMONIA: Ammonia: 21 umol/L (ref 9–35)

## 2021-06-27 LAB — TSH: TSH: 3.116 u[IU]/mL (ref 0.350–4.500)

## 2021-06-27 LAB — VITAMIN B12: Vitamin B-12: 633 pg/mL (ref 180–914)

## 2021-06-27 NOTE — Progress Notes (Signed)
Patient ID: [name], female   DOB: [birth year], 75 y.o.   MRN: [id number]  PROGRESS NOTE    [name]  [id number] DOB: 1946/03/16 DOA: 06/25/2021 PCP: [name], MD   Brief Narrative:   75 y.o. female with medical history significant for remote history of anal cancer stage IIIa, hyperlipidemia, MSSA bacteremia 11/2020, depression and a recent right knee arthroplasty on 06/09/2021 presented with increasing confusion, dysuria and urgency.  On presentation, she was febrile to 102.8, tachycardic with no leukocytosis but lactate of 3.8 and 4.4 subsequently.  CT of the head was negative for acute intracranial abnormality.  COVID-19 and influenza testing were negative.  Chest x-ray was negative for infiltrates.  She was started on IV fluids and antibiotics for possible UTI.  Assessment & Plan:   Severe sepsis: Present on admission UTI: Present on admission -Presented with fever, tachycardia, lactic acidosis and acute metabolic encephalopathy and symptoms of UTI -UA suggestive of UTI.   -T-max 102.2 over the last 24 hours.  Cultures negative so far.  Continue Rocephin  Acute metabolic encephalopathy -Possibly from above.   -Mental status improving.  Monitor mental status.  Fall precautions.  PT recommends home and PT.  Diet as per SLP recommendations. -Vitamin B12, ammonia and TSH normal  Recent right knee arthroplasty -By Dr. Marlou Sa on 06/09/2021 -She just had follow-up yesterday on 8/24 with AP lateral radiograph showing knee prosthesis in good position alignment with no complicating features -DC aspirin for now while patient is on Lovenox.  Resume aspirin on discharge for DVT prophylaxis as per orthopedics.  Depression -Continue Abilify and Cymbalta  Hypothyroidism -continue levothyroxine  Dyslipidemia -Continue statin  DVT prophylaxis: Lovenox Code Status: Full Family Communication: None at bedside Disposition Plan: Status is: Inpatient  Remains inpatient  appropriate because:Inpatient level of care appropriate due to severity of illness  Dispo: The patient is from: Home              Anticipated d/c is to: Home in 1 to 2 days if clinically improves              Patient currently is not medically stable to d/c.   Difficult to place patient No   Consultants: None  Procedures: None  Antimicrobials:  Anti-infectives (From admission, onward)    Start     Dose/Rate Route Frequency Ordered Stop   06/27/21 1000  cefTRIAXone (ROCEPHIN) 1 g in sodium chloride 0.9 % 100 mL IVPB        1 g 200 mL/hr over 30 Minutes Intravenous Every 24 hours 06/26/21 0011     06/25/21 1715  vancomycin (VANCOCIN) IVPB 1000 mg/200 mL premix        1,000 mg 200 mL/hr over 60 Minutes Intravenous  Once 06/25/21 1710 06/25/21 1922   06/25/21 1715  ceFEPIme (MAXIPIME) 2 g in sodium chloride 0.9 % 100 mL IVPB        2 g 200 mL/hr over 30 Minutes Intravenous  Once 06/25/21 1710 06/25/21 1856        Subjective: Patient seen and examined at bedside.  Had fever yesterday evening.  No vomiting, chest pain, worsening shortness of breath reported.  Objective: Vitals:   06/26/21 1937 06/26/21 2121 06/27/21 0122 06/27/21 0535  BP: (!) 123/54 (!) 145/69 (!) 107/57 127/65  Pulse: 93 81 88 83  Resp: '16 16 16 16  '$ Temp: 100 F (37.8 C) 98.4 F (36.9 C) 98.7 F (37.1 C) 98.1 F (36.7 C)  TempSrc: Oral Oral  Oral Oral  SpO2: 97% 96% 95% 98%  Weight:      Height:        Intake/Output Summary (Last 24 hours) at 06/27/2021 0757 Last data filed at 06/27/2021 0600 Gross per 24 hour  Intake 1443.75 ml  Output 2525 ml  Net -1081.25 ml    Filed Weights   06/26/21 0656  Weight: 76.7 kg    Examination:  General exam: No acute distress.  On room air currently.   Respiratory system: Respirations at bases bilaterally with some scattered crackles  cardiovascular system: Rate controlled, S1-S2 heard  gastrointestinal system: Abdomen is distended slightly, soft and  nontender.  Bowel sounds are heard Extremities: Trace lower extremity edema; no clubbing Central nervous system: More awake this morning and oriented.  No focal neurological deficits.  Moves extremities  skin: No obvious ecchymosis/lesions well-healed right knee midline scar with Steri-Strips Psychiatry: Affect is mostly flat.  Data Reviewed: I have personally reviewed following labs and imaging studies  CBC: Recent Labs  Lab 06/25/21 1615 06/27/21 0525  WBC 8.5 3.3*  NEUTROABS 6.8 PENDING  HGB 10.5* 9.0*  HCT 33.9* 28.7*  MCV 105.3* 102.5*  PLT 338 [id number]    Basic Metabolic Panel: Recent Labs  Lab 06/25/21 1615 06/26/21 0025 06/27/21 0525  NA 139 138 142  K 4.0 3.5 3.7  CL 103 104 106  CO2 '27 27 27  '$ GLUCOSE 94 111* 104*  BUN '11 11 12  '$ CREATININE 0.72 0.78 0.56  CALCIUM 9.3 8.7* 8.5*  MG  --   --  2.1    GFR: Estimated Creatinine Clearance: 59.1 mL/min (by C-G formula based on SCr of 0.56 mg/dL). Liver Function Tests: Recent Labs  Lab 06/25/21 1615 06/27/21 0525  AST 22 19  ALT 14 15  ALKPHOS 76 62  BILITOT 0.7 0.4  PROT 7.3 6.0*  ALBUMIN 3.8 2.9*    No results for input(s): LIPASE, AMYLASE in the last 168 hours. Recent Labs  Lab 06/27/21 0525  AMMONIA 21   Coagulation Profile: Recent Labs  Lab 06/25/21 1615  INR 1.1    Cardiac Enzymes: No results for input(s): CKTOTAL, CKMB, CKMBINDEX, TROPONINI in the last 168 hours. BNP (last 3 results) No results for input(s): PROBNP in the last 8760 hours. HbA1C: No results for input(s): HGBA1C in the last 72 hours. CBG: No results for input(s): GLUCAP in the last 168 hours. Lipid Profile: No results for input(s): CHOL, HDL, LDLCALC, TRIG, CHOLHDL, LDLDIRECT in the last 72 hours. Thyroid Function Tests: Recent Labs    06/27/21 0525  TSH 3.116   Anemia Panel: Recent Labs    06/27/21 0525  VITAMINB12 633   Sepsis Labs: Recent Labs  Lab 06/25/21 1615 06/25/21 1707 06/25/21 1954 06/25/21 2226   LATICACIDVEN 3.8* 4.4* 0.9 1.7     Recent Results (from the past 240 hour(s))  Culture, blood (routine x 2)     Status: None (Preliminary result)   Collection Time: 06/25/21  4:15 PM   Specimen: BLOOD LEFT HAND  Result Value Ref Range Status   Specimen Description   Final    BLOOD LEFT HAND Performed at [facility] [address] [place], [place] [address]    Special Requests   Final    BOTTLES DRAWN AEROBIC AND ANAEROBIC Blood Culture results may not be optimal due to an inadequate volume of blood received in culture bottles Performed at [facility] [address] [place], [place] [address]    Culture  Final    NO GROWTH < 12 HOURS Performed at [facility] [address] [place], [place] [address]    Report Status PENDING  Incomplete  Resp Panel by RT-PCR (Flu A&B, Covid) Nasopharyngeal Swab     Status: None   Collection Time: 06/25/21  4:15 PM   Specimen: Nasopharyngeal Swab; Nasopharyngeal(NP) swabs in vial transport medium  Result Value Ref Range Status   SARS Coronavirus 2 by RT PCR NEGATIVE NEGATIVE Final    Comment: (NOTE) SARS-CoV-2 target nucleic acids are NOT DETECTED.  The SARS-CoV-2 RNA is generally detectable in upper respiratory specimens during the acute phase of infection. The lowest concentration of SARS-CoV-2 viral copies this assay can detect is 138 copies/mL. A negative result does not preclude SARS-Cov-2 infection and should not be used as the sole basis for treatment or other patient management decisions. A negative result may occur with  improper specimen collection/handling, submission of specimen other than nasopharyngeal swab, presence of viral mutation(s) within the areas targeted by this assay, and inadequate number of viral copies(<138 copies/mL). A negative result must be combined with clinical observations, patient history, and epidemiological information. The expected result is  Negative.  Fact Sheet for Patients:  EntrepreneurPulse.com.au  Fact Sheet for Healthcare Providers:  IncredibleEmployment.be  This test is no t yet approved or cleared by the [place] FDA and  has been authorized for detection and/or diagnosis of SARS-CoV-2 by FDA under an Emergency Use Authorization (EUA). This EUA will remain  in effect (meaning this test can be used) for the duration of the COVID-19 declaration under Section 564(b)(1) of the Act, 21 U.S.C.section 360bbb-3(b)(1), unless the authorization is terminated  or revoked sooner.       Influenza A by PCR NEGATIVE NEGATIVE Final   Influenza B by PCR NEGATIVE NEGATIVE Final    Comment: (NOTE) The Xpert Xpress SARS-CoV-2/FLU/RSV plus assay is intended as an aid in the diagnosis of influenza from Nasopharyngeal swab specimens and should not be used as a sole basis for treatment. Nasal washings and aspirates are unacceptable for Xpert Xpress SARS-CoV-2/FLU/RSV testing.  Fact Sheet for Patients: EntrepreneurPulse.com.au  Fact Sheet for Healthcare Providers: IncredibleEmployment.be  This test is not yet approved or cleared by the [place] FDA and has been authorized for detection and/or diagnosis of SARS-CoV-2 by FDA under an Emergency Use Authorization (EUA). This EUA will remain in effect (meaning this test can be used) for the duration of the COVID-19 declaration under Section 564(b)(1) of the Act, 21 U.S.C. section 360bbb-3(b)(1), unless the authorization is terminated or revoked.  Performed at [facility], [place] [address], [place], [place] [address]   Culture, blood (routine x 2)     Status: None (Preliminary result)   Collection Time: 06/25/21  5:07 PM   Specimen: BLOOD  Result Value Ref Range Status   Specimen Description   Final    BLOOD RIGHT WRIST Performed at [facility]  [address], [place], [place] [address]    Special Requests   Final    BOTTLES DRAWN AEROBIC AND ANAEROBIC Blood Culture results may not be optimal due to an inadequate volume of blood received in culture bottles Performed at [facility] [address], [place], [place] [address]    Culture   Final    NO GROWTH < 12 HOURS Performed at [facility] [address], [place], [place] [address]    Report Status PENDING  Incomplete  Radiology Studies: CT Head Wo Contrast  Result Date: 06/25/2021 CLINICAL DATA:  Altered mental status EXAM: CT HEAD WITHOUT CONTRAST TECHNIQUE: Contiguous axial images were obtained from the base of the skull through the vertex without intravenous contrast. COMPARISON:  CT head 11/30/2020 FINDINGS: Brain: There is no evidence of acute intracranial hemorrhage, extra-axial fluid collection, or infarct. The ventricles are stable in size. There is no mass lesion. There is no midline shift. A remote lacunar infarct in the left external capsule is unchanged. Vascular: No hyperdense vessel or unexpected calcification. Skull: Normal. Negative for fracture or focal lesion. Sinuses/Orbits: The imaged paranasal sinuses are clear. The imaged globes and orbits are unremarkable. Other: None. IMPRESSION: No acute intracranial pathology. Electronically Signed   By: Valetta Mole M.D.   On: 06/25/2021 16:52   DG Chest Port 1 View  Result Date: 06/25/2021 CLINICAL DATA:  Fever, increased confusion, multiple falls EXAM: PORTABLE CHEST 1 VIEW COMPARISON:  None. FINDINGS: The heart size and mediastinal contours are within normal limits. Both lungs are clear. The visualized skeletal structures are unremarkable. IMPRESSION: No active disease. Electronically Signed   By: Randa Ngo M.D.   On: 06/25/2021 15:53        Scheduled Meds:  ARIPiprazole  10 mg Oral q AM   aspirin  81 mg Oral q AM   cholecalciferol  1,000 Units Oral q AM   DULoxetine  60 mg  Oral BID   enoxaparin (LOVENOX) injection  40 mg Subcutaneous [id number]   folic acid  1 mg Oral Daily   gabapentin  300 mg Oral BID   levothyroxine  25 mcg Oral Q0600   pravastatin  40 mg Oral Daily   traZODone  100 mg Oral QHS   vitamin B-12  500 mcg Oral q AM   Continuous Infusions:  cefTRIAXone (ROCEPHIN)  IV            Aline August, MD [facility] 06/27/2021, 7:57 AM

## 2021-06-27 NOTE — Progress Notes (Signed)
Physical Therapy Treatment Patient Details Name: [name]: [id number] DOB: [birth year] Today's Date: 06/27/2021    History of Present Illness Pt is a 75 y.o. female admitted from home 2* AMS, UTI, and falls.  Pt s/p R TKA on 06/09/21 and home from SNF rehab less than one week. PMH includes lumbar radiculopathy, depression, anal CA.    PT Comments    Pt is a re admit for AMS/UTI S/P R TKR  Pt was OOB in recliner.  General Comments: AxO x 2.5 pleasant with very mild impaired safety judegment.  "I don't remember yesterday" admitted pt.  Following all commands and some VC's needed for safety. Assisted with amb.  General Gait Details: cues for posture, position from RW and safety awareness.  Pt mildly unsteady with turns but self corrected. Practiced one step she has to enter home.  General stair comments: 75% VC's on proper sequencing "up with the good - down with the bad".  Perfomed twice and pt still could not recall.  "Oh yea" stated pt.  No overt LOB. Returned to room and performed some TKR TE's.  Pt has good R knee ROM of 0 degrees extension and a good 110 degrees of flexion with no pain just "a little tight". Pt plans to stay another day and D/C to home Sunday per medical.    Follow Up Recommendations  Home health PT     Equipment Recommendations  None recommended by PT    Recommendations for Other Services       Precautions / Restrictions Precautions Precautions: Fall;Knee Precaution Comments: instructed pt no pillow under knee Restrictions Weight Bearing Restrictions: No RLE Weight Bearing: Weight bearing as tolerated    Mobility  Bed Mobility               General bed mobility comments: OOB in recliner    Transfers Overall transfer level: Needs assistance Equipment used: Rolling walker (2 wheeled) Transfers: Sit to/from Stand Sit to Stand: Supervision         General transfer comment: cues for use of UEs to self assist and for basic safety  awareness otherwise self able  Ambulation/Gait Ambulation/Gait assistance: Supervision Gait Distance (Feet): 220 Feet Assistive device: Rolling walker (2 wheeled) Gait Pattern/deviations: Step-through pattern;Shuffle;Trunk flexed Gait velocity: Decreased   General Gait Details: cues for posture, position from RW and safety awareness.  Pt mildly unsteady with turns but self corrected.   Stairs Stairs: Yes Stairs assistance: Supervision;Min guard Stair Management: No rails;Step to pattern;Forwards;With walker Number of Stairs: 1 General stair comments: 75% VC's on proper sequencing "up with the good - down with the bad".  Perfomed twice and pt still could not recall.  "Oh yea" stated pt.  No overt LOB.   Wheelchair Mobility    Modified Rankin (Stroke Patients Only)       Balance                                            Cognition Arousal/Alertness: Awake/alert Behavior During Therapy: WFL for tasks assessed/performed Overall Cognitive Status: Within Functional Limits for tasks assessed                                 General Comments: AxO x 2.5 pleasant with very mild impaired safety judegment.  "I don't remember yesterday"  admitted pt.  Following all commands and some VC's needed for safety.      Exercises      General Comments        Pertinent Vitals/Pain Pain Assessment: No/denies pain Pain Location: R knee Pain Descriptors / Indicators: Tightness Pain Intervention(s): Monitored during session;Repositioned    Home Living                      Prior Function            PT Goals (current goals can now be found in the care plan section) Progress towards PT goals: Progressing toward goals    Frequency    Min 3X/week      PT Plan Current plan remains appropriate    Co-evaluation              AM-PAC PT "6 Clicks" Mobility   Outcome Measure  Help needed turning from your back to your side while in a  flat bed without using bedrails?: A Little Help needed moving from lying on your back to sitting on the side of a flat bed without using bedrails?: A Little Help needed moving to and from a bed to a chair (including a wheelchair)?: A Little Help needed standing up from a chair using your arms (e.g., wheelchair or bedside chair)?: A Little Help needed to walk in hospital room?: A Little Help needed climbing 3-5 steps with a railing? : A Little 6 Click Score: 18    End of Session Equipment Utilized During Treatment: Gait belt Activity Tolerance: Patient tolerated treatment well Patient left: in chair;with call bell/phone within reach;with chair alarm set Nurse Communication: Mobility status PT Visit Diagnosis: History of falling (Z91.81);Difficulty in walking, not elsewhere classified (R26.2);Pain Pain - Right/Left: Right Pain - part of body: Knee     Time: 1000-1025 PT Time Calculation (min) (ACUTE ONLY): 25 min  Charges:  $Gait Training: 8-22 mins $Therapeutic Exercise: 8-22 mins                      Rica Koyanagi  PTA Acute  Rehabilitation Services Pager      [phone] Office      [phone]

## 2021-06-28 LAB — URINE CULTURE: Culture: 20000 — AB

## 2021-06-28 MED ORDER — CEPHALEXIN 500 MG PO CAPS: 500.0000 mg | ORAL_CAPSULE | Freq: Three times a day (TID) | ORAL | 0 refills | Status: AC

## 2021-06-28 MED ORDER — CELECOXIB 100 MG PO CAPS: 100.0000 mg | ORAL_CAPSULE | Freq: Every day | ORAL | Status: DC | PRN

## 2021-06-28 NOTE — TOC Transition Note (Signed)
Transition of Care [facility]) - CM/SW Discharge Note   Patient Details  Name: [name]: [id number] Date of Birth: 1946-08-16  [facility] CM/SW Contact:  Ludwig Clarks, LCSW Phone Number: 06/28/2021, 2:03 PM   Clinical Narrative:    Pt released to home today; "I wanted to come home and see my puppy". Pt reports she had 2 days left of her home health care (CSW was able to determine through [facility]).  CSW advised pt of MD's new orders to resume and extend services longer given this hospitalization and need.  [place], with [facility] notified and to resume with [facility] care.

## 2021-06-28 NOTE — Progress Notes (Signed)
Pt was discharged home today. Instructions were reviewed with patient, and questions were answered. Pt was taken to main entrance via wheelchair by NT.  

## 2021-06-28 NOTE — Plan of Care (Signed)
  Problem: Urinary Elimination: Goal: Signs and symptoms of infection will decrease Outcome: Completed/Met

## 2021-06-28 NOTE — Discharge Summary (Signed)
Physician Discharge Summary  [name] [id number] DOB: 1946/03/24 DOA: 06/25/2021  PCP: [name], MD  Admit date: 06/25/2021 Discharge date: 06/28/2021  Admitted From: Home Disposition: Home  Recommendations for Outpatient Follow-up:  Follow up with PCP in 1 week with repeat CBC/BMP Follow up in ED if symptoms worsen or new appear   Home Health: Home health PT Equipment/Devices: None  Discharge Condition: Stable CODE STATUS: Full Diet recommendation: Heart healthy  Brief/Interim Summary: 75 y.o. female with medical history significant for remote history of anal cancer stage IIIa, hyperlipidemia, MSSA bacteremia 11/2020, depression and a recent right knee arthroplasty on 06/09/2021 presented with increasing confusion, dysuria and urgency.  On presentation, she was febrile to 102.8, tachycardic with no leukocytosis but lactate of 3.8 and 4.4 subsequently.  CT of the head was negative for acute intracranial abnormality.  COVID-19 and influenza testing were negative.  Chest x-ray was negative for infiltrates.  She was started on IV fluids and antibiotics for possible UTI.  During the hospitalization, her symptoms and condition have improved.  Her mental status is about the back to baseline.  Afebrile over the last 24 hours and wants to go home.  She will be discharged home today on oral Keflex.    Discharge Diagnoses:   Severe sepsis: Present on admission UTI: Present on admission -Presented with fever, tachycardia, lactic acidosis and acute metabolic encephalopathy and symptoms of UTI -UA suggestive of UTI.   -Afebrile for the last 24 hours.  Currently on Rocephin.  Blood cultures negative so far.  Urine culture growing 20,000 colonies per mL of E. coli. -Wants to go home today.  Discharge home on Keflex for 5 days.  Outpatient follow-up with PCP   Acute metabolic encephalopathy -Possibly from above.   -PT recommends home and PT.  Diet as per SLP recommendations. -Vitamin  B12, ammonia and TSH normal -Mental status has improved and is possibly back to baseline.   Recent right knee arthroplasty -By Dr. Marlou Sa on 06/09/2021 -She just had follow-up yesterday on 8/24 with AP lateral radiograph showing knee prosthesis in good position alignment with no complicating features - Resume aspirin on discharge for DVT prophylaxis as per orthopedics. -Outpatient follow-up with orthopedics   Depression -Continue Abilify and Cymbalta.  Recommend outpatient follow-up with psychiatry/PCP  Hypothyroidism -continue levothyroxine  Dyslipidemia -Continue statin   Discharge Instructions  Discharge Instructions     Diet - low sodium heart healthy   Complete by: As directed    Discharge wound care:   Complete by: As directed    Wound care as per prior orthopedics recommendations   Increase activity slowly   Complete by: As directed       Allergies as of 06/28/2021   No Known Allergies      Medication List     STOP taking these medications    HYDROcodone-acetaminophen 5-325 MG tablet Commonly known as: NORCO/VICODIN       TAKE these medications    acetaminophen 650 MG CR tablet Commonly known as: TYLENOL Take 650 mg by mouth every 8 (eight) hours as needed for pain.   ARIPiprazole 10 MG tablet Commonly known as: ABILIFY Take 10 mg by mouth in the morning.   aspirin 81 MG chewable tablet Chew 1 tablet (81 mg total) by mouth 2 (two) times daily. What changed: when to take this   CALCIUM 600+D3 PO Take 1 tablet by mouth in the morning.   celecoxib 100 MG capsule Commonly known as: CeleBREX Take 1 capsule (  100 mg total) by mouth daily as needed for mild pain.   cephALEXin 500 MG capsule Commonly known as: KEFLEX Take 1 capsule (500 mg total) by mouth 3 (three) times daily for 5 days.   cholecalciferol 25 MCG (1000 UNIT) tablet Commonly known as: VITAMIN D Take 1,000 Units by mouth in the morning.   DULoxetine 60 MG capsule Commonly known  as: CYMBALTA Take 60 mg by mouth 2 (two) times daily.   gabapentin 600 MG tablet Commonly known as: NEURONTIN Take 600 mg by mouth 2 (two) times daily.   levothyroxine 25 MCG tablet Commonly known as: SYNTHROID Take 25 mcg by mouth daily before breakfast.   methocarbamol 500 MG tablet Commonly known as: ROBAXIN Take 1 tablet (500 mg total) by mouth every 6 (six) hours as needed for muscle spasms.   multivitamin with minerals Tabs tablet Take 1 tablet by mouth in the morning. Centrum Silver for Women   pravastatin 40 MG tablet Commonly known as: PRAVACHOL Take 40 mg by mouth daily.   traZODone 100 MG tablet Commonly known as: DESYREL Take 100 mg by mouth at bedtime.   vitamin B-12 500 MCG tablet Commonly known as: CYANOCOBALAMIN Take 500 mcg by mouth in the morning.               Discharge Care Instructions  (From admission, onward)           Start     Ordered   06/28/21 0000  Discharge wound care:       Comments: Wound care as per prior orthopedics recommendations   06/28/21 0935            Follow-up Information     [name], MD. Schedule an appointment as soon as possible for a visit in 1 week(s).   Specialty: Family Medicine Why: with cbc/bmp Contact information: [place], [place] [address] [phone]                No Known Allergies  Consultations: None   Procedures/Studies: CT Head Wo Contrast  Result Date: 06/25/2021 CLINICAL DATA:  Altered mental status EXAM: CT HEAD WITHOUT CONTRAST TECHNIQUE: Contiguous axial images were obtained from the base of the skull through the vertex without intravenous contrast. COMPARISON:  CT head 11/30/2020 FINDINGS: Brain: There is no evidence of acute intracranial hemorrhage, extra-axial fluid collection, or infarct. The ventricles are stable in size. There is no mass lesion. There is no midline shift. A remote lacunar infarct in the left external capsule is  unchanged. Vascular: No hyperdense vessel or unexpected calcification. Skull: Normal. Negative for fracture or focal lesion. Sinuses/Orbits: The imaged paranasal sinuses are clear. The imaged globes and orbits are unremarkable. Other: None. IMPRESSION: No acute intracranial pathology. Electronically Signed   By: Valetta Mole M.D.   On: 06/25/2021 16:52   DG Chest Port 1 View  Result Date: 06/25/2021 CLINICAL DATA:  Fever, increased confusion, multiple falls EXAM: PORTABLE CHEST 1 VIEW COMPARISON:  None. FINDINGS: The heart size and mediastinal contours are within normal limits. Both lungs are clear. The visualized skeletal structures are unremarkable. IMPRESSION: No active disease. Electronically Signed   By: Randa Ngo M.D.   On: 06/25/2021 15:53   XR Knee 1-2 Views Right  Result Date: 06/24/2021 AP lateral radiographs right knee reviewed.  Cemented total knee prosthesis in good position and alignment with no complicating features.     Subjective: Patient seen and examined at bedside.  Feels better and wants  to go home.  Denies any overnight fever, vomiting, worsening shortness of breath.  Discharge Exam: Vitals:   06/27/21 2002 06/28/21 0509  BP: (!) 121/57 (!) 121/57  Pulse: 78 75  Resp: 17 18  Temp: 98.1 F (36.7 C) 98.7 F (37.1 C)  SpO2: 95% 95%    General: Pt is alert, awake, not in acute distress.  Chronically ill looking female; looks very deconditioned.  On room air currently.  Poor historian. Cardiovascular: rate controlled, S1/S2 + Respiratory: bilateral decreased breath sounds at bases Abdominal: Soft, NT, ND, bowel sounds + Extremities: no edema, no cyanosis    The results of significant diagnostics from this hospitalization (including imaging, microbiology, ancillary and laboratory) are listed below for reference.     Microbiology: Recent Results (from the past 240 hour(s))  Culture, blood (routine x 2)     Status: None (Preliminary result)   Collection  Time: 06/25/21  4:15 PM   Specimen: BLOOD LEFT HAND  Result Value Ref Range Status   Specimen Description   Final    BLOOD LEFT HAND Performed at [facility], [place] [address], [place], [place] [address]    Special Requests   Final    BOTTLES DRAWN AEROBIC AND ANAEROBIC Blood Culture results may not be optimal due to an inadequate volume of blood received in culture bottles Performed at [facility] [address], [place], [place] [address]    Culture   Final    NO GROWTH 2 DAYS Performed at [facility] [address] [place], [place] [address]    Report Status PENDING  Incomplete  Resp Panel by RT-PCR (Flu A&B, Covid) Nasopharyngeal Swab     Status: None   Collection Time: 06/25/21  4:15 PM   Specimen: Nasopharyngeal Swab; Nasopharyngeal(NP) swabs in vial transport medium  Result Value Ref Range Status   SARS Coronavirus 2 by RT PCR NEGATIVE NEGATIVE Final    Comment: (NOTE) SARS-CoV-2 target nucleic acids are NOT DETECTED.  The SARS-CoV-2 RNA is generally detectable in upper respiratory specimens during the acute phase of infection. The lowest concentration of SARS-CoV-2 viral copies this assay can detect is 138 copies/mL. A negative result does not preclude SARS-Cov-2 infection and should not be used as the sole basis for treatment or other patient management decisions. A negative result may occur with  improper specimen collection/handling, submission of specimen other than nasopharyngeal swab, presence of viral mutation(s) within the areas targeted by this assay, and inadequate number of viral copies(<138 copies/mL). A negative result must be combined with clinical observations, patient history, and epidemiological information. The expected result is Negative.  Fact Sheet for Patients:  EntrepreneurPulse.com.au  Fact Sheet for Healthcare Providers:  IncredibleEmployment.be  This test is  no t yet approved or cleared by the [place] FDA and  has been authorized for detection and/or diagnosis of SARS-CoV-2 by FDA under an Emergency Use Authorization (EUA). This EUA will remain  in effect (meaning this test can be used) for the duration of the COVID-19 declaration under Section 564(b)(1) of the Act, 21 U.S.C.section 360bbb-3(b)(1), unless the authorization is terminated  or revoked sooner.       Influenza A by PCR NEGATIVE NEGATIVE Final   Influenza B by PCR NEGATIVE NEGATIVE Final    Comment: (NOTE) The Xpert Xpress SARS-CoV-2/FLU/RSV plus assay is intended as an aid in the diagnosis of influenza from Nasopharyngeal swab specimens and should not be used as a sole basis for treatment. Nasal  washings and aspirates are unacceptable for Xpert Xpress SARS-CoV-2/FLU/RSV testing.  Fact Sheet for Patients: EntrepreneurPulse.com.au  Fact Sheet for Healthcare Providers: IncredibleEmployment.be  This test is not yet approved or cleared by the [place] FDA and has been authorized for detection and/or diagnosis of SARS-CoV-2 by FDA under an Emergency Use Authorization (EUA). This EUA will remain in effect (meaning this test can be used) for the duration of the COVID-19 declaration under Section 564(b)(1) of the Act, 21 U.S.C. section 360bbb-3(b)(1), unless the authorization is terminated or revoked.  Performed at [facility], [place] [address] [place], [place] [address]   Culture, blood (routine x 2)     Status: None (Preliminary result)   Collection Time: 06/25/21  5:07 PM   Specimen: BLOOD  Result Value Ref Range Status   Specimen Description   Final    BLOOD RIGHT WRIST Performed at [facility] [address], [place], [place] [address]    Special Requests   Final    BOTTLES DRAWN AEROBIC AND ANAEROBIC Blood Culture results may not be optimal due to an inadequate volume of blood  received in culture bottles Performed at [facility] [address], [place], [place] [address]    Culture   Final    NO GROWTH 2 DAYS Performed at [facility] [address], [place], [place] [address]    Report Status PENDING  Incomplete  Urine Culture     Status: Abnormal   Collection Time: 06/25/21  7:30 PM   Specimen: Urine, Clean Catch  Result Value Ref Range Status   Specimen Description   Final    URINE, CLEAN CATCH Performed at [facility], [place] [address], [place], [place] [address]    Special Requests   Final    NONE Performed at [facility], [place] [address], [place], [place] [address]    Culture 20,000 COLONIES/mL ESCHERICHIA COLI (A)  Final   Report Status 06/28/2021 FINAL  Final   Organism ID, Bacteria ESCHERICHIA COLI (A)  Final      Susceptibility   Escherichia coli - MIC*    AMPICILLIN >=32 RESISTANT Resistant     CEFAZOLIN 16 SENSITIVE Sensitive     CEFEPIME <=0.12 SENSITIVE Sensitive     CEFTRIAXONE <=0.25 SENSITIVE Sensitive     CIPROFLOXACIN <=0.25 SENSITIVE Sensitive     GENTAMICIN <=1 SENSITIVE Sensitive     IMIPENEM <=0.25 SENSITIVE Sensitive     NITROFURANTOIN <=16 SENSITIVE Sensitive     TRIMETH/SULFA <=20 SENSITIVE Sensitive     AMPICILLIN/SULBACTAM 16 INTERMEDIATE Intermediate     PIP/TAZO <=4 SENSITIVE Sensitive     * 20,000 COLONIES/mL ESCHERICHIA COLI     Labs: BNP (last 3 results) No results for input(s): BNP in the last 8760 hours. Basic Metabolic Panel: Recent Labs  Lab 06/25/21 1615 06/26/21 0025 06/27/21 0525  NA 139 138 142  K 4.0 3.5 3.7  CL 103 104 106  CO2 '27 27 27  '$ GLUCOSE 94 111* 104*  BUN '11 11 12  '$ CREATININE 0.72 0.78 0.56  CALCIUM 9.3 8.7* 8.5*  MG  --   --  2.1   Liver Function Tests: Recent Labs  Lab 06/25/21 1615 06/27/21 0525  AST 22 19  ALT 14 15  ALKPHOS 76 62  BILITOT 0.7 0.4  PROT 7.3 6.0*  ALBUMIN 3.8 2.9*   No results for  input(s): LIPASE, AMYLASE in the last 168 hours. Recent Labs  Lab  06/27/21 0525  AMMONIA 21   CBC: Recent Labs  Lab 06/25/21 1615 06/27/21 0525  WBC 8.5 3.3*  NEUTROABS 6.8 2.4  HGB 10.5* 9.0*  HCT 33.9* 28.7*  MCV 105.3* 102.5*  PLT 338 267   Cardiac Enzymes: No results for input(s): CKTOTAL, CKMB, CKMBINDEX, TROPONINI in the last 168 hours. BNP: Invalid input(s): POCBNP CBG: No results for input(s): GLUCAP in the last 168 hours. D-Dimer No results for input(s): DDIMER in the last 72 hours. Hgb A1c No results for input(s): HGBA1C in the last 72 hours. Lipid Profile No results for input(s): CHOL, HDL, LDLCALC, TRIG, CHOLHDL, LDLDIRECT in the last 72 hours. Thyroid function studies Recent Labs    06/27/21 0525  TSH 3.116   Anemia work up Recent Labs    06/27/21 0525  VITAMINB12 633   Urinalysis    Component Value Date/Time   COLORURINE YELLOW 06/25/2021 1930   APPEARANCEUR HAZY (A) 06/25/2021 1930   LABSPEC 1.009 06/25/2021 1930   PHURINE 7.0 06/25/2021 1930   GLUCOSEU NEGATIVE 06/25/2021 1930   HGBUR MODERATE (A) 06/25/2021 1930   BILIRUBINUR NEGATIVE 06/25/2021 1930   KETONESUR NEGATIVE 06/25/2021 1930   PROTEINUR 30 (A) 06/25/2021 1930   UROBILINOGEN 0.2 11/08/2011 1445   NITRITE POSITIVE (A) 06/25/2021 1930   LEUKOCYTESUR LARGE (A) 06/25/2021 1930   Sepsis Labs Invalid input(s): PROCALCITONIN,  WBC,  LACTICIDVEN Microbiology Recent Results (from the past 240 hour(s))  Culture, blood (routine x 2)     Status: None (Preliminary result)   Collection Time: 06/25/21  4:15 PM   Specimen: BLOOD LEFT HAND  Result Value Ref Range Status   Specimen Description   Final    BLOOD LEFT HAND Performed at [facility], [place] [address] [place], [place] [address]    Special Requests   Final    BOTTLES DRAWN AEROBIC AND ANAEROBIC Blood Culture results may not be optimal due to an inadequate volume of blood received in culture  bottles Performed at [facility] [address] [place], [place] [address]    Culture   Final    NO GROWTH 2 DAYS Performed at [facility] [address], [place], [place] [address]    Report Status PENDING  Incomplete  Resp Panel by RT-PCR (Flu A&B, Covid) Nasopharyngeal Swab     Status: None   Collection Time: 06/25/21  4:15 PM   Specimen: Nasopharyngeal Swab; Nasopharyngeal(NP) swabs in vial transport medium  Result Value Ref Range Status   SARS Coronavirus 2 by RT PCR NEGATIVE NEGATIVE Final    Comment: (NOTE) SARS-CoV-2 target nucleic acids are NOT DETECTED.  The SARS-CoV-2 RNA is generally detectable in upper respiratory specimens during the acute phase of infection. The lowest concentration of SARS-CoV-2 viral copies this assay can detect is 138 copies/mL. A negative result does not preclude SARS-Cov-2 infection and should not be used as the sole basis for treatment or other patient management decisions. A negative result may occur with  improper specimen collection/handling, submission of specimen other than nasopharyngeal swab, presence of viral mutation(s) within the areas targeted by this assay, and inadequate number of viral copies(<138 copies/mL). A negative result must be combined with clinical observations, patient history, and epidemiological information. The expected result is Negative.  Fact Sheet for Patients:  EntrepreneurPulse.com.au  Fact Sheet for Healthcare Providers:  IncredibleEmployment.be  This test is no t yet approved or cleared by the [place] FDA and  has been authorized for detection and/or diagnosis  of SARS-CoV-2 by FDA under an Emergency Use Authorization (EUA). This EUA will remain  in effect (meaning this test can be used) for the duration of the COVID-19 declaration under Section 564(b)(1) of the Act, 21 U.S.C.section 360bbb-3(b)(1), unless the authorization is  terminated  or revoked sooner.       Influenza A by PCR NEGATIVE NEGATIVE Final   Influenza B by PCR NEGATIVE NEGATIVE Final    Comment: (NOTE) The Xpert Xpress SARS-CoV-2/FLU/RSV plus assay is intended as an aid in the diagnosis of influenza from Nasopharyngeal swab specimens and should not be used as a sole basis for treatment. Nasal washings and aspirates are unacceptable for Xpert Xpress SARS-CoV-2/FLU/RSV testing.  Fact Sheet for Patients: EntrepreneurPulse.com.au  Fact Sheet for Healthcare Providers: IncredibleEmployment.be  This test is not yet approved or cleared by the [place] FDA and has been authorized for detection and/or diagnosis of SARS-CoV-2 by FDA under an Emergency Use Authorization (EUA). This EUA will remain in effect (meaning this test can be used) for the duration of the COVID-19 declaration under Section 564(b)(1) of the Act, 21 U.S.C. section 360bbb-3(b)(1), unless the authorization is terminated or revoked.  Performed at [facility], [place] [address] [place], [place] [address]   Culture, blood (routine x 2)     Status: None (Preliminary result)   Collection Time: 06/25/21  5:07 PM   Specimen: BLOOD  Result Value Ref Range Status   Specimen Description   Final    BLOOD RIGHT WRIST Performed at [facility] [address], [place], [place] [address]    Special Requests   Final    BOTTLES DRAWN AEROBIC AND ANAEROBIC Blood Culture results may not be optimal due to an inadequate volume of blood received in culture bottles Performed at [facility] [address], [place], [place] [address]    Culture   Final    NO GROWTH 2 DAYS Performed at [facility] [address], [place], [place] [address]    Report Status PENDING  Incomplete  Urine Culture     Status: Abnormal   Collection Time: 06/25/21  7:30 PM   Specimen: Urine, Clean Catch  Result  Value Ref Range Status   Specimen Description   Final    URINE, CLEAN CATCH Performed at [facility], [place] [address] [place], [place] [address]    Special Requests   Final    NONE Performed at [facility], [place] [address] [place], [place] [address]    Culture 20,000 COLONIES/mL ESCHERICHIA COLI (A)  Final   Report Status 06/28/2021 FINAL  Final   Organism ID, Bacteria ESCHERICHIA COLI (A)  Final      Susceptibility   Escherichia coli - MIC*    AMPICILLIN >=32 RESISTANT Resistant     CEFAZOLIN 16 SENSITIVE Sensitive     CEFEPIME <=0.12 SENSITIVE Sensitive     CEFTRIAXONE <=0.25 SENSITIVE Sensitive     CIPROFLOXACIN <=0.25 SENSITIVE Sensitive     GENTAMICIN <=1 SENSITIVE Sensitive     IMIPENEM <=0.25 SENSITIVE Sensitive     NITROFURANTOIN <=16 SENSITIVE Sensitive     TRIMETH/SULFA <=20 SENSITIVE Sensitive     AMPICILLIN/SULBACTAM 16 INTERMEDIATE Intermediate     PIP/TAZO <=4 SENSITIVE Sensitive     * 20,000 COLONIES/mL ESCHERICHIA COLI     Time coordinating discharge: 35 minutes  SIGNED:   Aline August, MD  [facility] 06/28/2021, 10:12 AM

## 2021-06-29 ENCOUNTER — Encounter: Payer: Self-pay | Admitting: *Deleted

## 2021-06-30 LAB — CULTURE, BLOOD (ROUTINE X 2)
Culture: NO GROWTH
Culture: NO GROWTH

## 2021-07-01 ENCOUNTER — Ambulatory Visit: Payer: Medicare HMO | Admitting: Rehabilitative and Restorative Service Providers"

## 2021-07-08 ENCOUNTER — Ambulatory Visit (INDEPENDENT_AMBULATORY_CARE_PROVIDER_SITE_OTHER): Payer: Medicare HMO | Admitting: Rehabilitative and Restorative Service Providers"

## 2021-07-08 ENCOUNTER — Encounter: Payer: Self-pay | Admitting: Rehabilitative and Restorative Service Providers"

## 2021-07-08 ENCOUNTER — Other Ambulatory Visit: Payer: Self-pay

## 2021-07-08 DIAGNOSIS — R262 Difficulty in walking, not elsewhere classified: Secondary | ICD-10-CM

## 2021-07-08 DIAGNOSIS — M25661 Stiffness of right knee, not elsewhere classified: Secondary | ICD-10-CM | POA: Diagnosis not present

## 2021-07-08 DIAGNOSIS — R600 Localized edema: Secondary | ICD-10-CM

## 2021-07-08 DIAGNOSIS — M25561 Pain in right knee: Secondary | ICD-10-CM

## 2021-07-08 DIAGNOSIS — M6281 Muscle weakness (generalized): Secondary | ICD-10-CM

## 2021-07-08 DIAGNOSIS — G8929 Other chronic pain: Secondary | ICD-10-CM

## 2021-07-08 NOTE — Therapy (Signed)
[facility] [address] [place], [place], [address] Phone: [phone]   Fax:  [phone]  Physical Therapy Evaluation  Patient Details  Name: [name]: [id number] Date of Birth: 08-24-1946 Referring Provider (PT): Meredith Pel MD  Referring diagnosis? [id number] Treatment diagnosis? (if different than referring diagnosis) R26.2  M62.81  R60.0  [id number] What was this (referring dx) caused by? '[x]'$  Surgery '[]'$  Fall '[x]'$  Ongoing issue '[x]'$  Arthritis '[]'$  Other: ____________  Laterality: '[x]'$  Rt '[]'$  Lt '[]'$  Both  Check all possible CPT codes:      '[x]'$  97110 (Therapeutic Exercise)  '[]'$  92507 (SLP Treatment)  '[x]'$  97112 (Neuro Re-ed)   '[]'$  92526 (Swallowing Treatment)   '[x]'$  97116 (Gait Training)   '[]'$  78295 (Cognitive Training, 1st 15 minutes) '[x]'$  97140 (Manual Therapy)   '[]'$  97130 (Cognitive Training, each add'l 15 minutes)  '[x]'$  97530 (Therapeutic Activities)  '[]'$  Other, List CPT Code ____________    '[x]'$  [id number] (Self Care)       '[x]'$  All codes above (97110 - 97535)  '[]'$  97012 (Mechanical Traction)  '[x]'$  97014 (E-stim Unattended)  '[]'$  97032 (E-stim manual)  '[]'$  97033 (Ionto)  '[]'$  97035 (Ultrasound)  '[]'$  97760 (Orthotic Fit) '[x]'$  97750 (Physical Performance Training) '[]'$  [id number] (Aquatic Therapy) '[]'$  97034 (Contrast Bath) '[]'$  [id number] (Paraffin) '[]'$  97597 (Wound Care 1st 20 sq cm) '[]'$  97598 (Wound Care each add'l 20 sq cm) '[x]'$  97016 (Vasopneumatic Device) '[]'$  [phone] Comptroller) '[]'$  [phone] (Prosthetic Training)  Encounter Date: 07/08/2021   PT End of Session - 07/08/21 1817     Visit Number 1    Number of Visits 24    Date for PT Re-Evaluation 09/30/21    Authorization Type [employer] MCR    Progress Note Due on Visit 10    PT Start Time 0807    PT Stop Time 0846    PT Time Calculation (min) 39 min    Activity Tolerance Patient tolerated treatment well    Behavior During Therapy [facility] for tasks assessed/performed             Past  Medical History:  Diagnosis Date   anal ca dx'd 11/2011   squamous cell carcinoma   Anxiety    Arthritis 2022   Depression    Full dentures    Hemorrhoids    external   History of radiation therapy 11/29/11 to 01/05/12   anal canal 5040 cGy 28 sessions, regional lymph nodes 4200 cGy 28 sessions   Hyperlipidemia    Rectal bleeding    Vitamin D deficiency    Wears glasses     Past Surgical History:  Procedure Laterality Date   ABDOMINAL HYSTERECTOMY  1978   age 21   [place]  12/04/2020   Procedure: BUBBLE STUDY;  Surgeon: [name], MD;  Location: [place];  Service: Cardiology;;   EXAMINATION UNDER ANESTHESIA  06/21/2012   Procedure: EXAM UNDER ANESTHESIA;  Surgeon: [name], MD;  Location: [place];  Service: General;  Laterality: N/A;  rectal exam under anesthesia, anal dilation, rectal biopsy   [place]   Patient had to guess on the date.   RECTAL BIOPSY  06/21/2012   Procedure: BIOPSY RECTAL;  Surgeon: [name], MD;  Location: [place];  Service: General;  Laterality: N/A;   TEE WITHOUT CARDIOVERSION N/A 12/04/2020   Procedure: TRANSESOPHAGEAL ECHOCARDIOGRAM (TEE);  Surgeon: [name], MD;  Location: [place];  Service: Cardiology;  Laterality: N/A;   TOTAL KNEE ARTHROPLASTY Right 06/09/2021   Procedure: RIGHT TOTAL KNEE ARTHROPLASTY;  Surgeon: Meredith Pel, MD;  Location: [place];  Service: Orthopedics;  Laterality: Right;   TUBAL LIGATION     b/l  age 75    There were no vitals filed for this visit.    Subjective Assessment - 07/08/21 0818     Subjective [place] had her R knee replaced 06/09/2021.  She is looking forward to getting off the walker, moving better and having less pain.    Pertinent History Hypothyroid, previous cancer/radiation, lumbar spinal stenosis    Limitations House hold activities;Standing;Walking    How long can you sit comfortably? 30 minutes    How long  can you stand comfortably? 20 minutes with walker    How long can you walk comfortably? 30 minutes with walker    Patient Stated Goals Walk better and have less pain    Currently in Pain? Yes    Pain Score 5     Pain Location Knee    Pain Orientation Right    Pain Descriptors / Indicators Aching;Tightness    Pain Type Surgical pain;Chronic pain    Pain Radiating Towards NA    Pain Onset 1 to 4 weeks ago    Pain Frequency Constant    Aggravating Factors  Bathing    Pain Relieving Factors Prescription pain meds    Effect of Pain on Daily Activities Using walker full time and limited with ADLs    Multiple Pain Sites No                [facility] PT Assessment - 07/08/21 0001       Assessment   Medical Diagnosis s/p R TKA    Referring Provider (PT) Meredith Pel MD    Onset Date/Surgical Date 06/09/21      Restrictions   Weight Bearing Restrictions No      Balance Screen   Has the patient fallen in the past 6 months Yes    How many times? 1    Has the patient had a decrease in activity level because of a fear of falling?  No    Is the patient reluctant to leave their home because of a fear of falling?  No      Home Ecologist residence    Available Help at Discharge Neighbor    Additional Comments 1 step      Prior Function   Level of [place] Retired    Leisure Copy   Overall Cognitive Status Within Functional Limits for tasks assessed      Observation/Other Assessments   Focus on Therapeutic Outcomes (FOTO)  33 (Goal 59 by visit 13)      ROM / Strength   AROM / PROM / Strength AROM;Strength      AROM   Overall AROM  Deficits    AROM Assessment Site Knee    Right/Left Knee Left;Right    Right Knee Extension -4    Right Knee Flexion 92    Left Knee Extension 0    Left Knee Flexion 130      Strength   Overall Strength Deficits    Strength Assessment Site Knee    Right/Left  Knee Left;Right    Right Knee Extension --   25.4 pounds   Left Knee Extension --   29.3 pounds  Objective measurements completed on examination: See above findings.       [facility] Adult PT Treatment/Exercise - 07/08/21 0001       Exercises   Exercises Knee/Hip      Knee/Hip Exercises: Seated   Knee/Hip Flexion Tailgate knee flexion AROM 3 minutes      Knee/Hip Exercises: Supine   Quad Sets Strengthening;Both;2 sets;10 reps;Limitations    Quad Sets Limitations 5 seconds (toes back, press knees down and tighten thighs)                     PT Education - 07/08/21 1817     Education Details Reviewed exam findings and starter HEP.    Person(s) Educated Patient    Methods Explanation;Demonstration;Tactile cues;Verbal cues;Handout    Comprehension Verbal cues required;Need further instruction;Returned demonstration;Verbalized understanding;Tactile cues required              PT Short Term Goals - 07/08/21 1822       PT SHORT TERM GOAL #1   Title Celena will have R knee AROM at -2 to 100 degrees or better.    Baseline -4 to 92 degrees    Time 4    Period Weeks    Status New    Target Date 08/05/21      PT SHORT TERM GOAL #2   Title Nicoleta will be independent with her week 1 HEP.    Baseline No HEP    Time 4    Period Weeks    Status New    Target Date 08/05/21               PT Long Term Goals - 07/08/21 1823       PT LONG TERM GOAL #1   Title Improve FOTO to 59.    Baseline 33    Time 12    Period Weeks    Status New    Target Date 09/30/21      PT LONG TERM GOAL #2   Title Jemma will report R knee pain consistently 0-4/10 on the Numeric Pain Rating Scale.    Baseline 5-7/10    Time 12    Period Weeks    Status New    Target Date 09/30/21      PT LONG TERM GOAL #3   Title Improve R knee AROM to 0-110 degrees    Baseline -4 to 92 degrees    Time 12    Period Weeks    Status New    Target Date  09/30/21      PT LONG TERM GOAL #4   Title Improve R quadriceps strength to at least 50 pounds.    Baseline 25.4 pounds    Time 12    Period Weeks    Status New    Target Date 09/30/21      PT LONG TERM GOAL #5   Title Marriana will be independent with her long-term HEP at DC.    Time 12    Period Weeks    Status New    Target Date 09/30/21                    Plan - 07/08/21 1818     Clinical Impression Statement Charlsie is < 1 month s/p R TKA.  She is lacking 4 degrees of extension and has 92 degrees of flexion.  Strength is poor and she is using a walker.  AROM, strength, balance, gait and function will  be the priority early with her supervised PT.  Her prognosis is good to meet LTGs with the recommended POC.    Personal Factors and Comorbidities Comorbidity 3+    Comorbidities Hypothyroid, previous cancer/radiation, lumbar spinal stenosis    Examination-Activity Limitations Bathing;Dressing;Bed Mobility;Hygiene/Grooming;Sleep;Squat;Lift;Bend;Locomotion Level;Stairs;Stand    Examination-Participation Restrictions Occupation;Cleaning;Laundry;Community Activity;Driving    Stability/Clinical Decision Making Stable/Uncomplicated    Clinical Decision Making Low    Rehab Potential Good    PT Frequency 3x / week    PT Duration 8 weeks    PT Treatment/Interventions ADLs/Self Care Home Management;Electrical Stimulation;Cryotherapy;Therapeutic activities;Stair training;Gait training;Therapeutic exercise;Neuromuscular re-education;Patient/family education;Manual techniques;Vasopneumatic Device    PT Next Visit Plan Bike, leg press, additional extension and flexion AROM interventions    PT Home Exercise Plan [id number]    Consulted and Agree with Plan of Care Patient             Patient will benefit from skilled therapeutic intervention in order to improve the following deficits and impairments:  Abnormal gait, Decreased activity tolerance, Decreased endurance, Decreased range of  motion, Decreased strength, Difficulty walking, Increased edema, Impaired flexibility, Pain  Visit Diagnosis: Difficulty walking  Muscle weakness (generalized)  Localized edema  Stiffness of right knee, not elsewhere classified  Chronic pain of right knee     Problem List Patient Active Problem List   Diagnosis Date Noted   Arthritis of right knee    UTI (urinary tract infection) 06/26/2021   Severe sepsis ([place]) [id number]   Acute metabolic encephalopathy [id number]   Hypothyroid 06/25/2021   S/P total knee arthroplasty, right 06/09/2021   Fever    Staphylococcus aureus bacteremia with sepsis ([place])    SIRS (systemic inflammatory response syndrome) ([place]) 11/30/2020   Lumbar radiculopathy 10/10/2019   Bilateral stenosis of lateral recess of lumbar spine 10/10/2019   Hemorrhoids, external without complications 0000000   Depression    Anxiety    Rectal bleeding    History of radiation therapy    Malignant neoplasm of anal canal ([place]) 11/15/2011   Hemorrhoids, internal, with bleeding 09/07/2011   Acquired anal stenosis 09/07/2011   Hyperlipidemia 09/07/2011   Family history of breast cancer in sister 09/07/2011    Farley Ly, PT, MPT 07/08/2021, 6:27 PM  [facility] [address] [place], [place], [address] Phone: [phone]   Fax:  [phone]  Name: MONTSERRATH REINHOLT MRN: [id number] Date of Birth: [birth year]

## 2021-07-08 NOTE — Patient Instructions (Signed)
Access Code: [email or web address] URL: [email or web address] Date: 07/08/2021 Prepared by: Vista Mink  Exercises Supine Quadricep Sets - 2-3 x daily - 7 x weekly - 2-3 sets - 10 reps - 5 second hold Seated Knee Flexion Extension AROM - 3-5 x daily - 7 x weekly - 1 sets - 1 reps - 3 minutes hold

## 2021-07-09 ENCOUNTER — Telehealth: Payer: Self-pay | Admitting: Family Medicine

## 2021-07-09 NOTE — Telephone Encounter (Signed)
   [name] DOB: [birth year] MRN: [id number]   RIDER WAIVER AND RELEASE OF LIABILITY  For purposes of improving physical access to our facilities, [facility] is pleased to partner with third parties to provide [facility] patients or other authorized individuals the option of convenient, on-demand ground transportation services (the [place]") through use of the technology service that enables users to request on-demand ground transportation from independent third-party providers.  By opting to use and accept these [employer], I, the undersigned, hereby agree on behalf of myself, and on behalf of any minor child using the Government social research officer for whom I am the parent or legal guardian, as follows:  Government social research officer provided to me are provided by independent third-party transportation providers who are not [employer] or employees and who are unaffiliated with [employer]. [facility] is neither a transportation carrier nor a common or public carrier. [facility] has no control over the quality or safety of the transportation that occurs as a result of the [employer]. [facility] cannot guarantee that any third-party transportation provider will complete any arranged transportation service. [facility] makes no representation, warranty, or guarantee regarding the reliability, timeliness, quality, safety, suitability, or availability of any of the Transport Services or that they will be error free. I fully understand that traveling by vehicle involves risks and dangers of serious bodily injury, including permanent disability, paralysis, and death. I agree, on behalf of myself and on behalf of any minor child using the Transport Services for whom I am the parent or legal guardian, that the entire risk arising out of my use of the [employer] remains solely with me, to the maximum extent permitted under applicable law. The [employer] are provided "as  is" and "as available." [facility] disclaims all representations and warranties, express, implied or statutory, not expressly set out in these terms, including the implied warranties of merchantability and fitness for a particular purpose. I hereby waive and release [place], its agents, employees, officers, directors, representatives, insurers, attorneys, assigns, successors, subsidiaries, and affiliates from any and all past, present, or future claims, demands, liabilities, actions, causes of action, or suits of any kind directly or indirectly arising from acceptance and use of the [employer]. I further waive and release [place] and its affiliates from all present and future liability and responsibility for any injury or death to persons or damages to property caused by or related to the use of the [employer]. I have read this Waiver and Release of Liability, and I understand the terms used in it and their legal significance. This Waiver is freely and voluntarily given with the understanding that my right (as well as the right of any minor child for whom I am the parent or legal guardian using the [employer]) to legal recourse against  in connection with the [employer] is knowingly surrendered in return for use of these services.   I attest that I read the consent document to [name], gave Ms. Finchum the opportunity to ask questions and answered the questions asked (if any). I affirm that [name] then provided consent for she's participation in this program.     [name]

## 2021-07-10 ENCOUNTER — Ambulatory Visit (INDEPENDENT_AMBULATORY_CARE_PROVIDER_SITE_OTHER): Payer: Medicare HMO | Admitting: Physical Therapy

## 2021-07-10 ENCOUNTER — Other Ambulatory Visit: Payer: Self-pay

## 2021-07-10 ENCOUNTER — Encounter: Payer: Self-pay | Admitting: Physical Therapy

## 2021-07-10 DIAGNOSIS — M25661 Stiffness of right knee, not elsewhere classified: Secondary | ICD-10-CM | POA: Diagnosis not present

## 2021-07-10 DIAGNOSIS — R262 Difficulty in walking, not elsewhere classified: Secondary | ICD-10-CM | POA: Diagnosis not present

## 2021-07-10 DIAGNOSIS — R600 Localized edema: Secondary | ICD-10-CM | POA: Diagnosis not present

## 2021-07-10 DIAGNOSIS — G8929 Other chronic pain: Secondary | ICD-10-CM

## 2021-07-10 DIAGNOSIS — M6281 Muscle weakness (generalized): Secondary | ICD-10-CM | POA: Diagnosis not present

## 2021-07-10 DIAGNOSIS — M25561 Pain in right knee: Secondary | ICD-10-CM

## 2021-07-10 NOTE — Therapy (Signed)
[facility] [address] [place], [place], [address] Phone: [phone]   Fax:  [phone]  Physical Therapy Treatment  Patient Details  Name: [name]: [id number] Date of Birth: 02/16/1946 Referring Provider (PT): Meredith Pel MD   Encounter Date: 07/10/2021   PT End of Session - 07/10/21 0926     Visit Number 2    Number of Visits 24    Date for PT Re-Evaluation 09/30/21    Authorization Type [employer] [facility]    Authorization Time Period 9/7-11/7    Authorization - Visit Number 2    Authorization - Number of Visits 12    Progress Note Due on Visit 10    PT Start Time 0843    PT Stop Time 0925    PT Time Calculation (min) 42 min    Activity Tolerance Patient tolerated treatment well    Behavior During Therapy [facility] for tasks assessed/performed             Past Medical History:  Diagnosis Date   anal ca dx'd 11/2011   squamous cell carcinoma   Anxiety    Arthritis 2022   Depression    Full dentures    Hemorrhoids    external   History of radiation therapy 11/29/11 to 01/05/12   anal canal 5040 cGy 28 sessions, regional lymph nodes 4200 cGy 28 sessions   Hyperlipidemia    Rectal bleeding    Vitamin D deficiency    Wears glasses     Past Surgical History:  Procedure Laterality Date   ABDOMINAL HYSTERECTOMY  1978   age 75   BUBBLE STUDY  12/04/2020   Procedure: BUBBLE STUDY;  Surgeon: [name], MD;  Location: [place];  Service: Cardiology;;   EXAMINATION UNDER ANESTHESIA  06/21/2012   Procedure: EXAM UNDER ANESTHESIA;  Surgeon: [name], MD;  Location: [place];  Service: General;  Laterality: N/A;  rectal exam under anesthesia, anal dilation, rectal biopsy   [place]   Patient had to guess on the date.   RECTAL BIOPSY  06/21/2012   Procedure: BIOPSY RECTAL;  Surgeon: [name], MD;  Location: [place];  Service: General;  Laterality: N/A;    TEE WITHOUT CARDIOVERSION N/A 12/04/2020   Procedure: TRANSESOPHAGEAL ECHOCARDIOGRAM (TEE);  Surgeon: [name], MD;  Location: [place];  Service: Cardiology;  Laterality: N/A;   TOTAL KNEE ARTHROPLASTY Right 06/09/2021   Procedure: RIGHT TOTAL KNEE ARTHROPLASTY;  Surgeon: Meredith Pel, MD;  Location: [place];  Service: Orthopedics;  Laterality: Right;   TUBAL LIGATION     b/l  age 75    There were no vitals filed for this visit.   Subjective Assessment - 07/10/21 0848     Subjective doing well, knee is a little sore    Pertinent History Hypothyroid, previous cancer/radiation, lumbar spinal stenosis    Limitations House hold activities;Standing;Walking    How long can you sit comfortably? 30 minutes    How long can you stand comfortably? 20 minutes with walker    How long can you walk comfortably? 30 minutes with walker    Patient Stated Goals Walk better and have less pain    Currently in Pain? Yes    Pain Score 5     Pain Location Knee    Pain Orientation Right    Pain Descriptors / Indicators Aching;Tightness    Pain Type Surgical pain;Chronic pain  Pain Onset 1 to 4 weeks ago    Pain Frequency Constant    Aggravating Factors  bathing    Pain Relieving Factors pain meds                               [facility] Adult PT Treatment/Exercise - 07/10/21 0849       Ambulation/Gait   Gait Comments supervision with SPC with mod cues for sequencing and technique; step to pattern progressing to step through      Knee/Hip Exercises: Stretches   Knee: Self-Stretch Limitations RLE: 10x10 sec hold with LLE crossed over RLE    Other Knee/Hip Stretches seated tailgate flexion x 2 min      Knee/Hip Exercises: Aerobic   Recumbent Bike seat 5 x 8 min, partial revolutions      Knee/Hip Exercises: Seated   Long Arc Quad Right;3 sets;10 reps;Weights    Long Arc Quad Weight 4 lbs.      Manual Therapy   Manual therapy comments seated knee flexion to  tolerance                       PT Short Term Goals - 07/08/21 1822       PT SHORT TERM GOAL #1   Title Kynzie will have R knee AROM at -2 to 100 degrees or better.    Baseline -4 to 92 degrees    Time 4    Period Weeks    Status New    Target Date 08/05/21      PT SHORT TERM GOAL #2   Title Annalaya will be independent with her week 1 HEP.    Baseline No HEP    Time 4    Period Weeks    Status New    Target Date 08/05/21               PT Long Term Goals - 07/08/21 1823       PT LONG TERM GOAL #1   Title Improve FOTO to 59.    Baseline 33    Time 12    Period Weeks    Status New    Target Date 09/30/21      PT LONG TERM GOAL #2   Title Irine will report R knee pain consistently 0-4/10 on the Numeric Pain Rating Scale.    Baseline 5-7/10    Time 12    Period Weeks    Status New    Target Date 09/30/21      PT LONG TERM GOAL #3   Title Improve R knee AROM to 0-110 degrees    Baseline -4 to 92 degrees    Time 12    Period Weeks    Status New    Target Date 09/30/21      PT LONG TERM GOAL #4   Title Improve R quadriceps strength to at least 50 pounds.    Baseline 25.4 pounds    Time 12    Period Weeks    Status New    Target Date 09/30/21      PT LONG TERM GOAL #5   Title Lillyahna will be independent with her long-term HEP at DC.    Time 12    Period Weeks    Status New    Target Date 09/30/21  Plan - 07/10/21 0926     Clinical Impression Statement Pt tolerated session well today with focus on ROM and quad strengthening.  No goals met as only 2nd visit.  Leg press deferred today due to time.  Will continue to benefit from PT to maximize function.    Personal Factors and Comorbidities Comorbidity 3+    Comorbidities Hypothyroid, previous cancer/radiation, lumbar spinal stenosis    Examination-Activity Limitations Bathing;Dressing;Bed Mobility;Hygiene/Grooming;Sleep;Squat;Lift;Bend;Locomotion  Level;Stairs;Stand    Examination-Participation Restrictions Occupation;Cleaning;Laundry;Community Activity;Driving    Stability/Clinical Decision Making Stable/Uncomplicated    Rehab Potential Good    PT Frequency 3x / week    PT Duration 8 weeks    PT Treatment/Interventions ADLs/Self Care Home Management;Electrical Stimulation;Cryotherapy;Therapeutic activities;Stair training;Gait training;Therapeutic exercise;Neuromuscular re-education;Patient/family education;Manual techniques;Vasopneumatic Device    PT Next Visit Plan Bike, leg press, additional extension and flexion AROM interventions; measure    PT Home Exercise Plan [phone]    Consulted and Agree with Plan of Care Patient             Patient will benefit from skilled therapeutic intervention in order to improve the following deficits and impairments:  Abnormal gait, Decreased activity tolerance, Decreased endurance, Decreased range of motion, Decreased strength, Difficulty walking, Increased edema, Impaired flexibility, Pain  Visit Diagnosis: Difficulty walking  Muscle weakness (generalized)  Localized edema  Stiffness of right knee, not elsewhere classified  Chronic pain of right knee     Problem List Patient Active Problem List   Diagnosis Date Noted   Arthritis of right knee    UTI (urinary tract infection) 06/26/2021   Severe sepsis ([place]) 65/99/3570   Acute metabolic encephalopathy 17/79/3903   Hypothyroid 06/25/2021   S/P total knee arthroplasty, right 06/09/2021   Fever    Staphylococcus aureus bacteremia with sepsis ([place])    SIRS (systemic inflammatory response syndrome) ([place]) 11/30/2020   Lumbar radiculopathy 10/10/2019   Bilateral stenosis of lateral recess of lumbar spine 10/10/2019   Hemorrhoids, external without complications 00/92/3300   Depression    Anxiety    Rectal bleeding    History of radiation therapy    Malignant neoplasm of anal canal ([place]) 11/15/2011   Hemorrhoids, internal, with  bleeding 09/07/2011   Acquired anal stenosis 09/07/2011   Hyperlipidemia 09/07/2011   Family history of breast cancer in sister 09/07/2011      Laureen Abrahams, PT, DPT 07/10/21 9:28 AM    [facility] [address] [place], [place], [address] Phone: [phone]   Fax:  [phone]  Name: KAHLIYAH DICK MRN: [id number] Date of Birth: [birth year]

## 2021-07-13 ENCOUNTER — Ambulatory Visit
Admission: RE | Admit: 2021-07-13 | Discharge: 2021-07-13 | Disposition: A | Discharge status: Home / Self Care | Payer: Medicare HMO | Source: Ambulatory Visit | Attending: Specialist | Admitting: Specialist

## 2021-07-13 DIAGNOSIS — M4807 Spinal stenosis, lumbosacral region: Secondary | ICD-10-CM

## 2021-07-13 IMAGING — MR MR LUMBAR SPINE W/O CM
4 of 5 series · 27 of 48 positions shown · non-contrast
Comparison: [DATE]

CLINICAL DATA: Spinal stenosis of lumbosacral region. Lumbar
radiculopathy. Chronic low back pain. Right anterior leg pain in an
L3 or L4 distribution. Compression fractures.

EXAM:
MRI LUMBAR SPINE WITHOUT CONTRAST
TECHNIQUE: Multiplanar, multisequence MR imaging of the lumbar spine was
performed. No intravenous contrast was administered.

[Series 3: T2 · sagittal · 4.0mm · 1.09mm/px · 7 of 16 slices shown (1 of 2)]
[im 1/16]
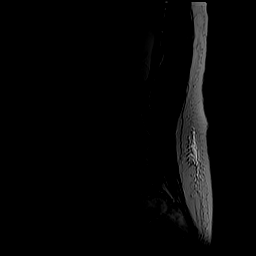
[im 3/16]
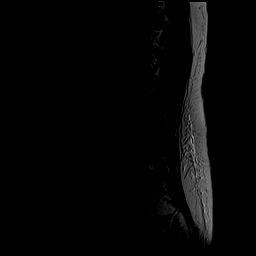
[im 6/16]
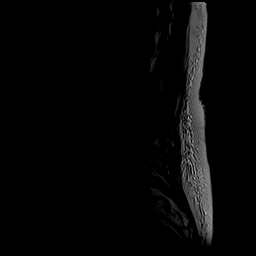
[im 8/16]
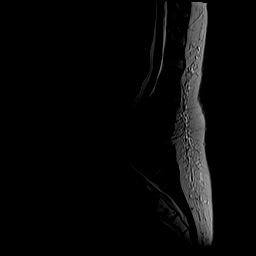
[im 11/16]
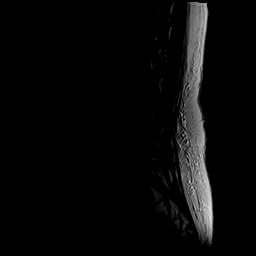
[im 13/16]
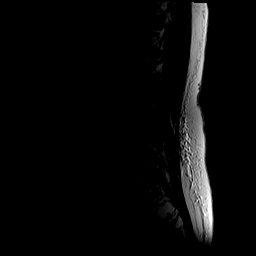
[im 16/16]
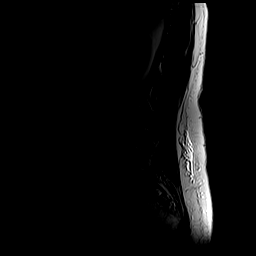

[Series 5: T1 · sagittal · 4.0mm · 1.09mm/px · 6 of 16 slices shown (1 of 2)]
[im 1/16]
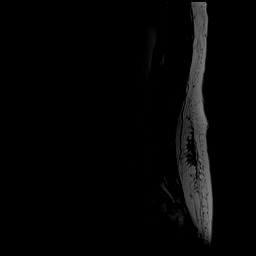
[im 4/16]
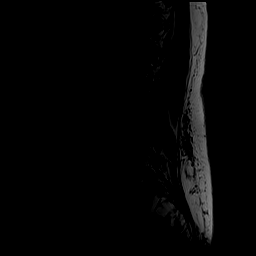
[im 7/16]
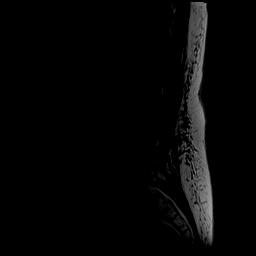
[im 10/16]
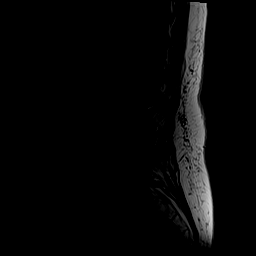
[im 13/16]
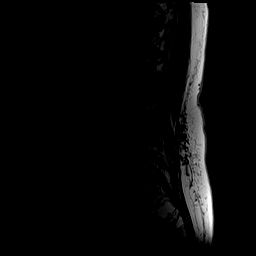
[im 16/16]
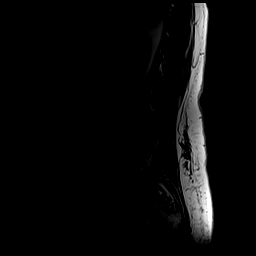

[Series 6: T2 · axial · 4.0mm · 0.39mm/px · z∈[-18,+192]mm · 8 of 36 slices shown (2 of 2)]
[im 1/36]
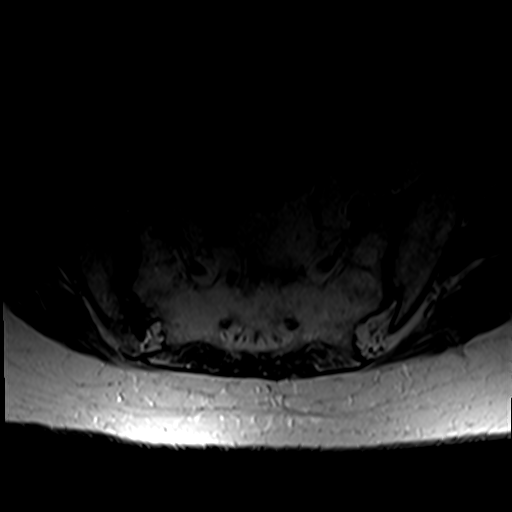
[im 6/36]
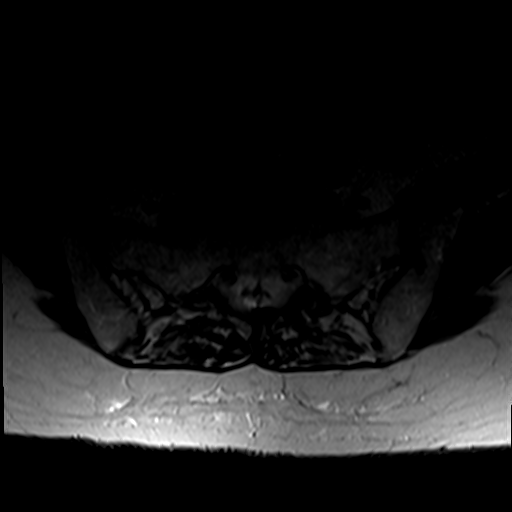
[im 11/36]
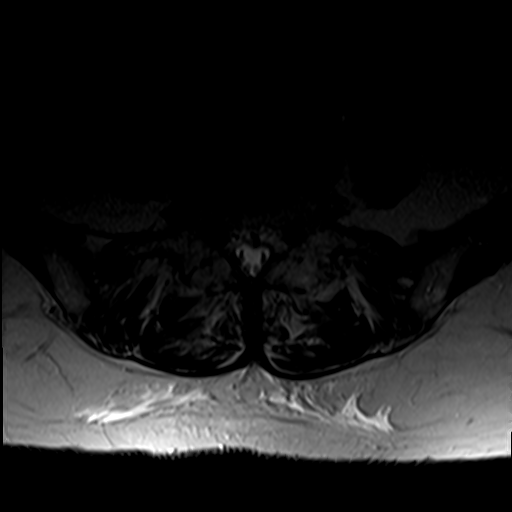
[im 17/36]
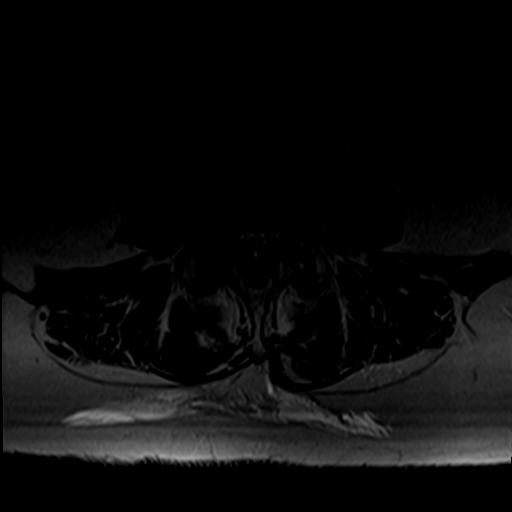
[im 19/36]
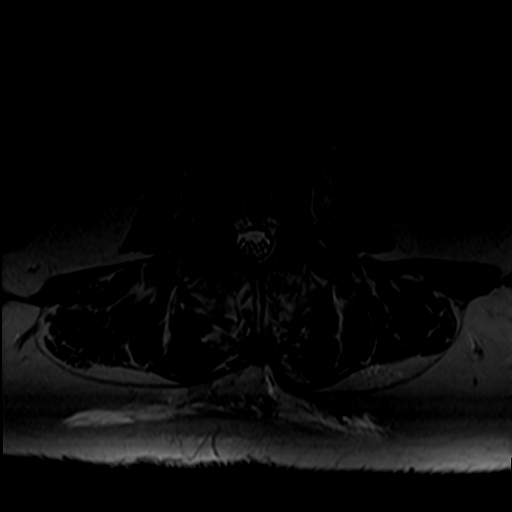
[im 25/36]
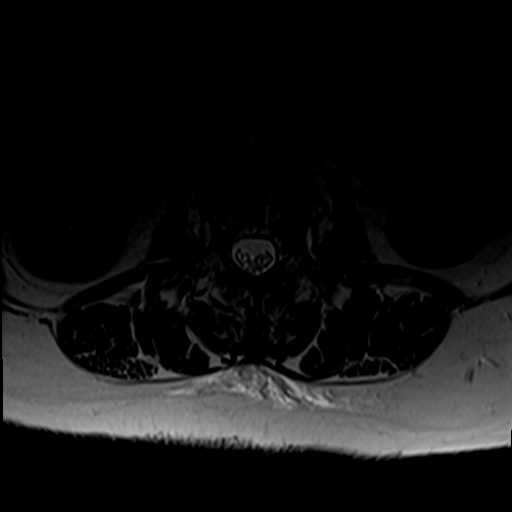
[im 30/36]
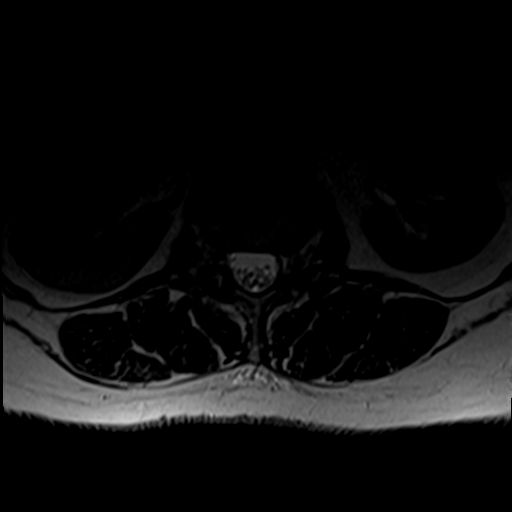
[im 36/36]
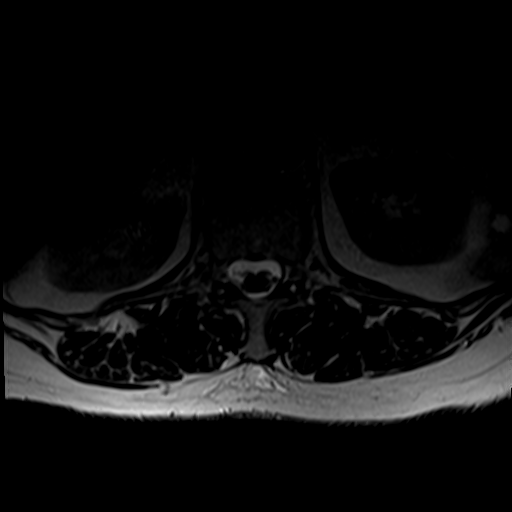

[Series 7: T1 · axial · 4.0mm · 0.39mm/px · z∈[-18,+163]mm · 6 of 36 slices shown (2 of 2)]
[im 1/36]
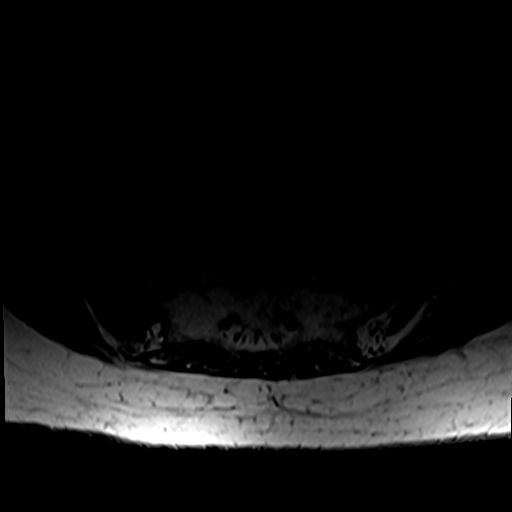
[im 6/36]
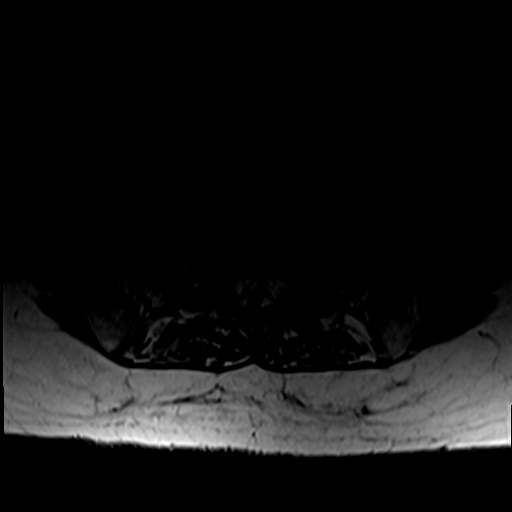
[im 11/36]
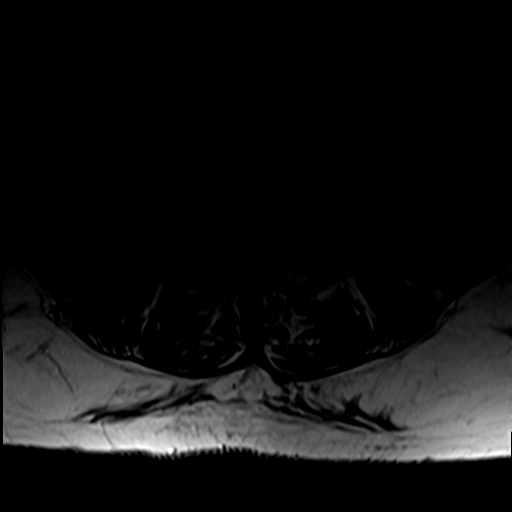
[im 17/36]
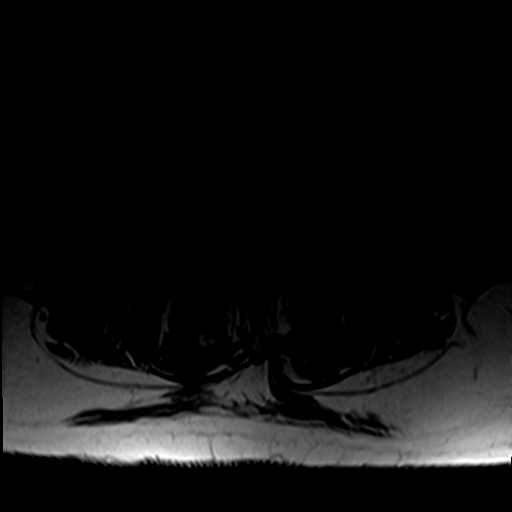
[im 19/36]
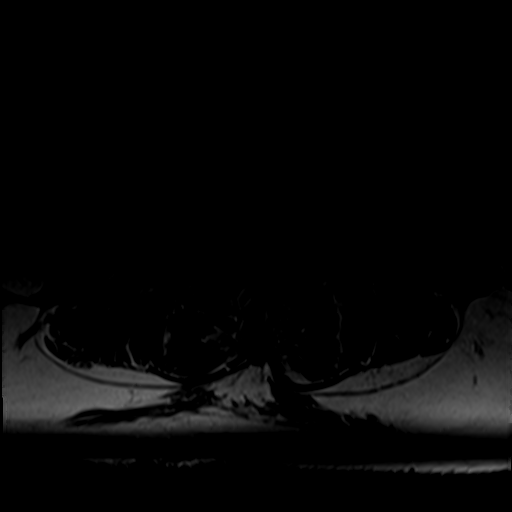
[im 30/36]
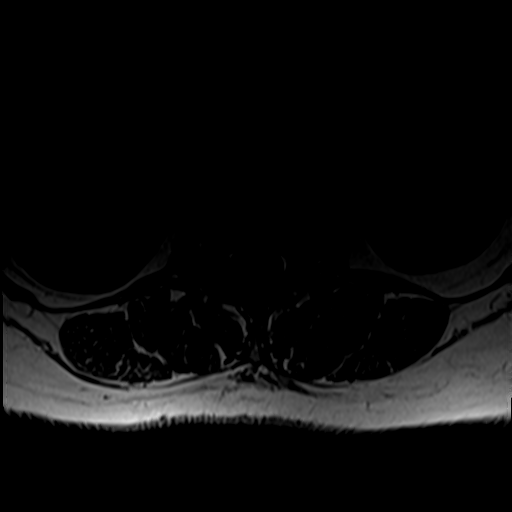

[27 of 48 positions shown; findings below may reference images not displayed]

FINDINGS: Segmentation: Standard.

Alignment: Unchanged trace anterolisthesis of L4 on L5. 5 mm
anterolisthesis of L5 on S1, mildly increased and facet mediated.

Vertebrae: No acute fracture, suspicious marrow lesion, or
significant marrow edema. Unchanged chronic L1, L3, and L5
compression fractures. Unchanged T11 superior endplate Schmorl's
node. Scattered small vertebral hemangiomas.

Conus medullaris and cauda equina: Conus extends to the L1 level.
Conus and cauda equina appear normal.

Paraspinal and other soft tissues: Unchanged subcentimeter T2
hyperintensity in the lower pole of the right kidney, likely a cyst.

Disc levels:

Disc desiccation throughout the lumbar spine. Mild disc space
narrowing at L3-4 and L5-S1.

T12-L1: Mild disc bulging and mild facet arthrosis without stenosis,
unchanged.

L1-2: Stable to slightly increased disc bulging. Mild facet
hypertrophy. No significant stenosis.

L2-3: Disc bulging, endplate spurring, and moderate facet
hypertrophy result in mild spinal stenosis and mild bilateral neural
foraminal stenosis, unchanged.

L3-4: Disc bulging and severe facet and ligamentum flavum
hypertrophy have progressed, and there is a new right foraminal disc
protrusion. These findings result in moderate spinal stenosis and
severe right and moderate left neural foraminal stenosis. Likely
right L3 nerve root impingement.

L4-5: Disc bulging and severe facet hypertrophy result in mild
bilateral lateral recess stenosis, borderline spinal stenosis, and
borderline bilateral neural foraminal stenosis, unchanged. Bilateral
facet ankylosis.

L5-S1: Anterolisthesis with bulging uncovered disc, a central disc
extrusion, and severe facet hypertrophy result in moderate spinal
stenosis, moderate bilateral lateral recess stenosis, and moderate
bilateral neural foraminal stenosis, progressed from prior.
IMPRESSION: 1. Progressive disc and facet degeneration at L3-4 with a new right
foraminal disc protrusion. Severe right neural foraminal stenosis
and moderate spinal stenosis.
2. Progressive, moderate spinal stenosis and moderate bilateral
neural foraminal stenosis at L5-S1.

## 2021-07-15 ENCOUNTER — Ambulatory Visit (INDEPENDENT_AMBULATORY_CARE_PROVIDER_SITE_OTHER): Payer: Medicare HMO | Admitting: Rehabilitative and Restorative Service Providers"

## 2021-07-15 ENCOUNTER — Other Ambulatory Visit: Payer: Self-pay

## 2021-07-15 ENCOUNTER — Encounter: Payer: Self-pay | Admitting: Rehabilitative and Restorative Service Providers"

## 2021-07-15 DIAGNOSIS — R600 Localized edema: Secondary | ICD-10-CM | POA: Diagnosis not present

## 2021-07-15 DIAGNOSIS — M6281 Muscle weakness (generalized): Secondary | ICD-10-CM

## 2021-07-15 DIAGNOSIS — M25561 Pain in right knee: Secondary | ICD-10-CM

## 2021-07-15 DIAGNOSIS — M25661 Stiffness of right knee, not elsewhere classified: Secondary | ICD-10-CM

## 2021-07-15 DIAGNOSIS — R262 Difficulty in walking, not elsewhere classified: Secondary | ICD-10-CM

## 2021-07-15 DIAGNOSIS — G8929 Other chronic pain: Secondary | ICD-10-CM

## 2021-07-15 NOTE — Therapy (Addendum)
[facility] [address] [place], [place], [address] Phone: [phone]   Fax:  [phone]  Physical Therapy Treatment  Patient Details  Name: [name]: [id number] Date of Birth: 25-Dec-1945 Referring Provider (PT): Meredith Pel MD   Encounter Date: 07/15/2021   PT End of Session - 07/15/21 1433     Visit Number 3    Number of Visits 24    Date for PT Re-Evaluation 09/30/21    Authorization Type [employer] MCR    Authorization Time Period 9/7-11/7    Authorization - Visit Number 3    Authorization - Number of Visits 12    Progress Note Due on Visit 10    PT Start Time 1426    PT Stop Time 1506    PT Time Calculation (min) 40 min    Activity Tolerance Patient tolerated treatment well    Behavior During Therapy [facility] for tasks assessed/performed             Past Medical History:  Diagnosis Date   anal ca dx'd 11/2011   squamous cell carcinoma   Anxiety    Arthritis 2022   Depression    Full dentures    Hemorrhoids    external   History of radiation therapy 11/29/11 to 01/05/12   anal canal 5040 cGy 28 sessions, regional lymph nodes 4200 cGy 28 sessions   Hyperlipidemia    Rectal bleeding    Vitamin D deficiency    Wears glasses     Past Surgical History:  Procedure Laterality Date   ABDOMINAL HYSTERECTOMY  1978   age 54   BUBBLE STUDY  12/04/2020   Procedure: BUBBLE STUDY;  Surgeon: [name], MD;  Location: [place];  Service: Cardiology;;   EXAMINATION UNDER ANESTHESIA  06/21/2012   Procedure: EXAM UNDER ANESTHESIA;  Surgeon: [name], MD;  Location: [place];  Service: General;  Laterality: N/A;  rectal exam under anesthesia, anal dilation, rectal biopsy   [place]   Patient had to guess on the date.   RECTAL BIOPSY  06/21/2012   Procedure: BIOPSY RECTAL;  Surgeon: [name], MD;  Location: [place];  Service: General;  Laterality: N/A;    TEE WITHOUT CARDIOVERSION N/A 12/04/2020   Procedure: TRANSESOPHAGEAL ECHOCARDIOGRAM (TEE);  Surgeon: [name], MD;  Location: [place];  Service: Cardiology;  Laterality: N/A;   TOTAL KNEE ARTHROPLASTY Right 06/09/2021   Procedure: RIGHT TOTAL KNEE ARTHROPLASTY;  Surgeon: Meredith Pel, MD;  Location: [place];  Service: Orthopedics;  Laterality: Right;   TUBAL LIGATION     b/l  age 34    There were no vitals filed for this visit.   Subjective Assessment - 07/15/21 1431     Subjective Pt. reported pain level at 6/10 upon arrival with walking today.  Pt. stated having a cane but hasn't used it.  Reported feeling a bit better.    Pertinent History Hypothyroid, previous cancer/radiation, lumbar spinal stenosis    Limitations House hold activities;Standing;Walking    How long can you sit comfortably? 30 minutes    How long can you stand comfortably? 20 minutes with walker    How long can you walk comfortably? 30 minutes with walker    Patient Stated Goals Walk better and have less pain    Currently in Pain? Yes    Pain Score 6     Pain Location knee   Pain Orientation Right  Pain Descriptors / Indicators Aching;Tightness    Pain Onset 1 to 4 weeks ago    Pain Frequency Intermittent    Aggravating Factors  walking, bending, static positioning    Pain Relieving Factors pain medicine                [facility] PT Assessment - 07/15/21 0001       Assessment   Medical Diagnosis s/p Rt TKA    Referring Provider (PT) Meredith Pel MD    Onset Date/Surgical Date 06/09/21      AROM   Right Knee Flexion 110   seated heel slide                          [facility] Adult PT Treatment/Exercise - 07/15/21 0001       Ambulation/Gait   Gait Comments SPC use in Lt UE c cues for sequencing and placement continued through 50 ft trial, SBA      Knee/Hip Exercises: Stretches   Knee: Self-Stretch Limitations revewied verbally for home (incorporated into LAQ)     Other Knee/Hip Stretches supine heel prop 5 mins (cues for home use)      Knee/Hip Exercises: Aerobic   Recumbent Bike Seat 5 6 mins full revolutions      Knee/Hip Exercises: Seated   Long Arc Quad Right;2 sets;10 reps   x 10 no weight, 2 x 10 c 4 lb weight.  pause in extension and end flexion.  contralateral leg movement opposite   Long Arc Quad Weight 4 lbs.    Sit to Sand without UE support;10 reps   20 inch table s UE assist     Knee/Hip Exercises: Supine   Heel Slides Right;10 reps;AROM   quad set to straighten leg     Manual Therapy   Manual therapy comments Rt knee flexion c mobilization IR/distraction                       PT Short Term Goals - 07/15/21 1501       PT SHORT TERM GOAL #1   Title [name] will have R knee AROM at -2 to 100 degrees or better.    Time 4    Period Weeks    Status Partially Met    Target Date 08/05/21      PT SHORT TERM GOAL #2   Title [name] will be independent with her week 1 HEP.    Time 4    Period Weeks    Status Partially Met    Target Date 08/05/21               PT Long Term Goals - 07/08/21 1823       PT LONG TERM GOAL #1   Title Improve FOTO to 59.    Baseline 33    Time 12    Period Weeks    Status New    Target Date 09/30/21      PT LONG TERM GOAL #2   Title [name] will report R knee pain consistently 0-4/10 on the Numeric Pain Rating Scale.    Baseline 5-7/10    Time 12    Period Weeks    Status New    Target Date 09/30/21      PT LONG TERM GOAL #3   Title Improve R knee AROM to 0-110 degrees    Baseline -4 to 92 degrees    Time 12  Period Weeks    Status New    Target Date 09/30/21      PT LONG TERM GOAL #4   Title Improve R quadriceps strength to at least 50 pounds.    Baseline 25.4 pounds    Time 12    Period Weeks    Status New    Target Date 09/30/21      PT LONG TERM GOAL #5   Title [name] will be independent with her long-term HEP at [place].    Time 12    Period Weeks     Status New    Target Date 09/30/21                   Plan - 07/15/21 1447     Clinical Impression Statement Demonstration and performance of SPC use not appropriate for home use at this time due to instability and sequencing planning still needing improvement.  Continued skilled PT services indicated at this time.    Personal Factors and Comorbidities Comorbidity 3+    Comorbidities Hypothyroid, previous cancer/radiation, lumbar spinal stenosis    Examination-Activity Limitations Bathing;Dressing;Bed Mobility;Hygiene/Grooming;Sleep;Squat;Lift;Bend;Locomotion Level;Stairs;Stand    Examination-Participation Restrictions Occupation;Cleaning;Laundry;Community Activity;Driving    Stability/Clinical Decision Making Stable/Uncomplicated    Rehab Potential Good    PT Frequency 3x / week    PT Duration 8 weeks    PT Treatment/Interventions ADLs/Self Care Home Management;Electrical Stimulation;Cryotherapy;Therapeutic activities;Stair training;Gait training;Therapeutic exercise;Neuromuscular re-education;Patient/family education;Manual techniques;Vasopneumatic Device    PT Next Visit Plan Quad strengthening, SPC improvements, static balance.    PT Home Exercise Plan [phone]    Consulted and Agree with Plan of Care Patient             Patient will benefit from skilled therapeutic intervention in order to improve the following deficits and impairments:  Abnormal gait, Decreased activity tolerance, Decreased endurance, Decreased range of motion, Decreased strength, Difficulty walking, Increased edema, Impaired flexibility, Pain  Visit Diagnosis: Difficulty walking  Muscle weakness (generalized)  Localized edema  Stiffness of right knee, not elsewhere classified  Chronic pain of right knee     Problem List Patient Active Problem List   Diagnosis Date Noted   Arthritis of right knee    UTI (urinary tract infection) 06/26/2021   Severe sepsis ([place]) 11/09/3233   Acute  metabolic encephalopathy 57/32/2025   Hypothyroid 06/25/2021   S/P total knee arthroplasty, right 06/09/2021   Fever    Staphylococcus aureus bacteremia with sepsis ([place])    SIRS (systemic inflammatory response syndrome) ([place]) 11/30/2020   Lumbar radiculopathy 10/10/2019   Bilateral stenosis of lateral recess of lumbar spine 10/10/2019   Hemorrhoids, external without complications 42/70/6237   Depression    Anxiety    Rectal bleeding    History of radiation therapy    Malignant neoplasm of anal canal ([place]) 11/15/2011   Hemorrhoids, internal, with bleeding 09/07/2011   Acquired anal stenosis 09/07/2011   Hyperlipidemia 09/07/2011   Family history of breast cancer in sister 09/07/2011   Scot Jun, PT, DPT, OCS, ATC 07/15/21  3:05 PM    [facility] [address] [place], [place], [address] Phone: [phone]   Fax:  [phone]  Name: [name]: [id number] Date of Birth: 12-22-1945

## 2021-07-20 ENCOUNTER — Encounter: Payer: Self-pay | Admitting: Orthopedic Surgery

## 2021-07-20 ENCOUNTER — Other Ambulatory Visit: Payer: Self-pay

## 2021-07-20 ENCOUNTER — Ambulatory Visit (INDEPENDENT_AMBULATORY_CARE_PROVIDER_SITE_OTHER): Payer: Medicare HMO | Admitting: Orthopedic Surgery

## 2021-07-20 DIAGNOSIS — Z96651 Presence of right artificial knee joint: Secondary | ICD-10-CM

## 2021-07-20 MED ORDER — CELECOXIB 100 MG PO CAPS: 100.0000 mg | ORAL_CAPSULE | Freq: Every day | ORAL | 0 refills | Status: DC | PRN

## 2021-07-20 NOTE — Progress Notes (Signed)
Post-Op Visit Note   [name]           Date of Birth: April 28, 1946           MRN: [id number] Visit Date: 07/20/2021 PCP: [name], MD   Assessment & Plan:  Chief Complaint:  Chief Complaint  Patient presents with   Right Knee - Routine Post Op   Visit Diagnoses:  1. S/P total knee arthroplasty, right     Plan: Patient is a 75 year old female who presents s/p right total knee arthroplasty on 06/09/2021.  She reports that she is doing very well.  She is taking Celebrex for pain control and is not taking any opioid medication any longer.  She is ambulating with a walker.  Go to physical therapy 2 times per week and doing home exercise program on the other days.  Denies any chest pain or shortness of breath or any changes in the appearance of her incision.  No falls or injuries to report.  On exam she has 3 degrees extension and 110 degrees of knee flexion with incision that is well-healed without any evidence of infection or dehiscence.  Negative Homans' sign.  No calf tenderness.  She is able to perform straight leg raise with no extensor lag.  No effusion of the operative knee.  She walks without significant antalgia only using the walker for balance.  Plan to refill Celebrex today.  She is okay to get back to driving but cautioned her against going on the highway; she only wants to drive to the grocery store and church really.  Would recommend that she works on achieving full knee extension and working on her quadricep strength so that she is stable enough to transition from walker to cane but after discussion with patient sounds like she is already doing this with her physical therapist.  Appreciate their working with her and overall she seems to be progressing well.  Plan to follow-up in 6 weeks for final check with Dr. Marlou Sa.  Follow-Up Instructions: No follow-ups on file.   Orders:  No orders of the defined types were placed in this encounter.  No orders of the  defined types were placed in this encounter.   Imaging: No results found.  PMFS History: Patient Active Problem List   Diagnosis Date Noted   Arthritis of right knee    UTI (urinary tract infection) 06/26/2021   Severe sepsis ([place]) [id number]   Acute metabolic encephalopathy [id number]   Hypothyroid 06/25/2021   S/P total knee arthroplasty, right 06/09/2021   Fever    Staphylococcus aureus bacteremia with sepsis ([place])    SIRS (systemic inflammatory response syndrome) ([place]) 11/30/2020   Lumbar radiculopathy 10/10/2019   Bilateral stenosis of lateral recess of lumbar spine 10/10/2019   Hemorrhoids, external without complications 0000000   Depression    Anxiety    Rectal bleeding    History of radiation therapy    Malignant neoplasm of anal canal ([place]) 11/15/2011   Hemorrhoids, internal, with bleeding 09/07/2011   Acquired anal stenosis 09/07/2011   Hyperlipidemia 09/07/2011   Family history of breast cancer in sister 09/07/2011   Past Medical History:  Diagnosis Date   anal ca dx'd 11/2011   squamous cell carcinoma   Anxiety    Arthritis 2022   Depression    Full dentures    Hemorrhoids    external   History of radiation therapy 11/29/11 to 01/05/12   anal canal 5040 cGy 28 sessions, regional lymph nodes  4200 cGy 28 sessions   Hyperlipidemia    Rectal bleeding    Vitamin D deficiency    Wears glasses     Family History  Problem Relation Age of Onset   Cancer Sister        breast/ age 18   Cervical cancer Sister        54   Alcohol abuse Maternal Uncle     Past Surgical History:  Procedure Laterality Date   ABDOMINAL HYSTERECTOMY  1978   age 63   BUBBLE STUDY  12/04/2020   Procedure: BUBBLE STUDY;  Surgeon: [name], MD;  Location: [facility] END[facility];  Service: Cardiology;;   EXAMINATION UNDER ANESTHESIA  06/21/2012   Procedure: EXAM UNDER ANESTHESIA;  Surgeon: [name], MD;  Location: [place];  Service: General;  Laterality: N/A;   rectal exam under anesthesia, anal dilation, rectal biopsy   [place]   Patient had to guess on the date.   RECTAL BIOPSY  06/21/2012   Procedure: BIOPSY RECTAL;  Surgeon: [name], MD;  Location: [place];  Service: General;  Laterality: N/A;   TEE WITHOUT CARDIOVERSION N/A 12/04/2020   Procedure: TRANSESOPHAGEAL ECHOCARDIOGRAM (TEE);  Surgeon: [name], MD;  Location: [place];  Service: Cardiology;  Laterality: N/A;   TOTAL KNEE ARTHROPLASTY Right 06/09/2021   Procedure: RIGHT TOTAL KNEE ARTHROPLASTY;  Surgeon: Meredith Pel, MD;  Location: [place];  Service: Orthopedics;  Laterality: Right;   TUBAL LIGATION     b/l  age 24   Social History   Occupational History   Not on file  Tobacco Use   Smoking status: Former    Packs/day: 0.50    Years: 25.00    Pack years: 12.50    Types: Cigarettes    Quit date: 09/07/1987    Years since quitting: 33.8   Smokeless tobacco: Never  Vaping Use   Vaping Use: Never used  Substance and Sexual Activity   Alcohol use: No   Drug use: No   Sexual activity: Not Currently

## 2021-07-21 ENCOUNTER — Ambulatory Visit (INDEPENDENT_AMBULATORY_CARE_PROVIDER_SITE_OTHER): Payer: Medicare HMO | Admitting: Rehabilitative and Restorative Service Providers"

## 2021-07-21 ENCOUNTER — Encounter: Payer: Self-pay | Admitting: Rehabilitative and Restorative Service Providers"

## 2021-07-21 DIAGNOSIS — M25661 Stiffness of right knee, not elsewhere classified: Secondary | ICD-10-CM | POA: Diagnosis not present

## 2021-07-21 DIAGNOSIS — R600 Localized edema: Secondary | ICD-10-CM

## 2021-07-21 DIAGNOSIS — G8929 Other chronic pain: Secondary | ICD-10-CM

## 2021-07-21 DIAGNOSIS — M6281 Muscle weakness (generalized): Secondary | ICD-10-CM

## 2021-07-21 DIAGNOSIS — M25561 Pain in right knee: Secondary | ICD-10-CM

## 2021-07-21 DIAGNOSIS — R262 Difficulty in walking, not elsewhere classified: Secondary | ICD-10-CM

## 2021-07-21 NOTE — Therapy (Signed)
[facility] [address] [place], [place], [address] Phone: [phone]   Fax:  [phone]  Physical Therapy Treatment  Patient Details  Name: [name]: [id number] Date of Birth: [birth year] Referring Provider (PT): Tiffany Pel MD   Encounter Date: 07/21/2021   PT End of Session - 07/21/21 1338     Visit Number 4    Number of Visits 24    Date for PT Re-Evaluation 09/30/21    Authorization Type [employer] MCR    Authorization Time Period 9/7-11/7    Authorization - Visit Number 4    Authorization - Number of Visits 12    Progress Note Due on Visit 10    PT Start Time 1300    PT Stop Time 1340    PT Time Calculation (min) 40 min    Activity Tolerance Patient tolerated treatment well    Behavior During Therapy [facility] for tasks assessed/performed             Past Medical History:  Diagnosis Date   anal ca dx'd 11/2011   squamous cell carcinoma   Anxiety    Arthritis 2022   Depression    Full dentures    Hemorrhoids    external   History of radiation therapy 11/29/11 to 01/05/12   anal canal 5040 cGy 28 sessions, regional lymph nodes 4200 cGy 28 sessions   Hyperlipidemia    Rectal bleeding    Vitamin D deficiency    Wears glasses     Past Surgical History:  Procedure Laterality Date   ABDOMINAL HYSTERECTOMY  1978   age 11   BUBBLE STUDY  12/04/2020   Procedure: BUBBLE STUDY;  Surgeon: Tiffany Munroe, MD;  Location: [place];  Service: Cardiology;;   EXAMINATION UNDER ANESTHESIA  06/21/2012   Procedure: EXAM UNDER ANESTHESIA;  Surgeon: Tiffany Hector, MD;  Location: [place];  Service: General;  Laterality: N/A;  rectal exam under anesthesia, anal dilation, rectal biopsy   [place]   Patient had to guess on the date.   RECTAL BIOPSY  06/21/2012   Procedure: BIOPSY RECTAL;  Surgeon: Tiffany Hector, MD;  Location: [place];  Service: General;  Laterality: N/A;    TEE WITHOUT CARDIOVERSION N/A 12/04/2020   Procedure: TRANSESOPHAGEAL ECHOCARDIOGRAM (TEE);  Surgeon: Tiffany Munroe, MD;  Location: [place];  Service: Cardiology;  Laterality: N/A;   TOTAL KNEE ARTHROPLASTY Right 06/09/2021   Procedure: RIGHT TOTAL KNEE ARTHROPLASTY;  Surgeon: Tiffany Pel, MD;  Location: [place];  Service: Orthopedics;  Laterality: Right;   TUBAL LIGATION     b/l  age 72    There were no vitals filed for this visit.   Subjective Assessment - 07/21/21 1309     Subjective Pt. indicated a little pain 4/10 today upon arrival.  Pt. indicated she has a pain c sitting initially in side of leg but better after sitting a bit.  Pt. stated she felt unsteady walking in c cane today.    Pertinent History Hypothyroid, previous cancer/radiation, lumbar spinal stenosis    Limitations House hold activities;Standing;Walking    How long can you sit comfortably? 30 minutes    Patient Stated Goals Walk better and have less pain    Currently in Pain? Yes    Pain Score 4     Pain Location Knee    Pain Orientation Right    Pain Descriptors / Indicators Aching;Tightness    Pain  Type Chronic pain;Surgical pain    Pain Onset More than a month ago    Pain Frequency Intermittent    Aggravating Factors  sitting initially, bending    Pain Relieving Factors rest                               [facility] Adult PT Treatment/Exercise - 07/21/21 0001       Ambulation/Gait   Gait Comments Continued training c SPC in Lt UE c cues verball/tactile for sequencing with Rt LE, performed 50 ft.  Analysis of indepenent ambulation in clinic revealed slight improvement in stability on level surfaces vs. cane use due to umfamiliarity.      Neuro Re-ed    Neuro Re-ed Details  tandem stance 1 min x 1 c mild to moderate HHA corrections      Knee/Hip Exercises: Stretches   Other Knee/Hip Stretches reviewed heel prop verbally      Knee/Hip Exercises: Aerobic   Recumbent Bike seat  5 6 mins lvl 1 full revolutions      Knee/Hip Exercises: Standing   Forward Step Up Step Height: 4";Hand Hold: 1;2 sets;10 reps;Right      Knee/Hip Exercises: Seated   Other Seated Knee/Hip Exercises seated SLR Rt 3 x 5 (consistent cues)    Sit to Sand without UE support;15 reps   18 inch chair c airex pad                      PT Short Term Goals - 07/15/21 1501       PT SHORT TERM GOAL #1   Title [name] will have R knee AROM at -2 to 100 degrees or better.    Time 4    Period Weeks    Status Partially Met    Target Date 08/05/21      PT SHORT TERM GOAL #2   Title [name] will be independent with her week 1 HEP.    Time 4    Period Weeks    Status Partially Met    Target Date 08/05/21               PT Long Term Goals - 07/08/21 1823       PT LONG TERM GOAL #1   Title Improve FOTO to 59.    Baseline 33    Time 12    Period Weeks    Status New    Target Date 09/30/21      PT LONG TERM GOAL #2   Title [name] will report R knee pain consistently 0-4/10 on the Numeric Pain Rating Scale.    Baseline 5-7/10    Time 12    Period Weeks    Status New    Target Date 09/30/21      PT LONG TERM GOAL #3   Title Improve R knee AROM to 0-110 degrees    Baseline -4 to 92 degrees    Time 12    Period Weeks    Status New    Target Date 09/30/21      PT LONG TERM GOAL #4   Title Improve R quadriceps strength to at least 50 pounds.    Baseline 25.4 pounds    Time 12    Period Weeks    Status New    Target Date 09/30/21      PT LONG TERM GOAL #5   Title [name] will be  independent with her long-term HEP at DC.    Time 12    Period Weeks    Status New    Target Date 09/30/21                   Plan - 07/21/21 1322     Clinical Impression Statement Analysis of indepenent ambulation in clinic revealed slight improvement in stability on level surfaces vs. cane use due to umfamiliarity.  Continued plan for strengthening in full extension to  improve functional mobility independence.  Difficulty in TKE holding in SLR attempts    Personal Factors and Comorbidities Comorbidity 3+    Comorbidities Hypothyroid, previous cancer/radiation, lumbar spinal stenosis    Examination-Activity Limitations Bathing;Dressing;Bed Mobility;Hygiene/Grooming;Sleep;Squat;Lift;Bend;Locomotion Level;Stairs;Stand    Examination-Participation Restrictions Occupation;Cleaning;Laundry;Community Activity;Driving    Stability/Clinical Decision Making Stable/Uncomplicated    Rehab Potential Good    PT Frequency 3x / week    PT Duration 8 weeks    PT Treatment/Interventions ADLs/Self Care Home Management;Electrical Stimulation;Cryotherapy;Therapeutic activities;Stair training;Gait training;Therapeutic exercise;Neuromuscular re-education;Patient/family education;Manual techniques;Vasopneumatic Device    PT Next Visit Plan Quad strengthening, SPC improvements or transition to independent ambulation, static balance.    PT Home Exercise Plan [id number]    Consulted and Agree with Plan of Care Patient             Patient will benefit from skilled therapeutic intervention in order to improve the following deficits and impairments:  Abnormal gait, Decreased activity tolerance, Decreased endurance, Decreased range of motion, Decreased strength, Difficulty walking, Increased edema, Impaired flexibility, Pain  Visit Diagnosis: Difficulty walking  Muscle weakness (generalized)  Localized edema  Stiffness of right knee, not elsewhere classified  Chronic pain of right knee     Problem List Patient Active Problem List   Diagnosis Date Noted   Arthritis of right knee    UTI (urinary tract infection) 06/26/2021   Severe sepsis ([place]) 97/35/3299   Acute metabolic encephalopathy 24/26/8341   Hypothyroid 06/25/2021   S/P total knee arthroplasty, right 06/09/2021   Fever    Staphylococcus aureus bacteremia with sepsis ([place])    SIRS (systemic inflammatory  response syndrome) ([place]) 11/30/2020   Lumbar radiculopathy 10/10/2019   Bilateral stenosis of lateral recess of lumbar spine 10/10/2019   Hemorrhoids, external without complications 96/22/2979   Depression    Anxiety    Rectal bleeding    History of radiation therapy    Malignant neoplasm of anal canal ([place]) 11/15/2011   Hemorrhoids, internal, with bleeding 09/07/2011   Acquired anal stenosis 09/07/2011   Hyperlipidemia 09/07/2011   Family history of breast cancer in sister 09/07/2011   Scot Jun, PT, DPT, OCS, ATC 07/21/21  1:39 PM    [facility] [address] [place], [place], [address] Phone: [phone]   Fax:  [phone]  Name: [name]: [id number] Date of Birth: [birth year]

## 2021-07-23 ENCOUNTER — Other Ambulatory Visit: Payer: Self-pay

## 2021-07-23 ENCOUNTER — Ambulatory Visit (INDEPENDENT_AMBULATORY_CARE_PROVIDER_SITE_OTHER): Payer: Medicare HMO | Admitting: Rehabilitative and Restorative Service Providers"

## 2021-07-23 ENCOUNTER — Encounter: Payer: Self-pay | Admitting: Rehabilitative and Restorative Service Providers"

## 2021-07-23 DIAGNOSIS — R262 Difficulty in walking, not elsewhere classified: Secondary | ICD-10-CM

## 2021-07-23 DIAGNOSIS — M25661 Stiffness of right knee, not elsewhere classified: Secondary | ICD-10-CM

## 2021-07-23 DIAGNOSIS — G8929 Other chronic pain: Secondary | ICD-10-CM

## 2021-07-23 DIAGNOSIS — M6281 Muscle weakness (generalized): Secondary | ICD-10-CM

## 2021-07-23 DIAGNOSIS — M25561 Pain in right knee: Secondary | ICD-10-CM

## 2021-07-23 DIAGNOSIS — R600 Localized edema: Secondary | ICD-10-CM | POA: Diagnosis not present

## 2021-07-23 NOTE — Patient Instructions (Signed)
Access Code: [email or web address] URL: [email or web address] Date: 07/23/2021 Prepared by: Vista Mink  Exercises Supine Quadricep Sets - 2-3 x daily - 7 x weekly - 2-3 sets - 10 reps - 5 second hold Seated Knee Flexion Extension AROM - 3 x daily - 7 x weekly - 1 sets - 1 reps - 3 minutes hold

## 2021-07-23 NOTE — Therapy (Signed)
[facility] [address] [place], [place], [address] Phone: [phone]   Fax:  [phone]  Physical Therapy Treatment  Patient Details  Name: [name]: [id number] Date of Birth: 11/07/1945 Referring Provider (PT): Meredith Pel MD   Encounter Date: 07/23/2021   PT End of Session - 07/23/21 1031     Visit Number 5    Number of Visits 24    Date for PT Re-Evaluation 09/30/21    Authorization Type [employer] MCR    Authorization Time Period 9/7-11/7    Authorization - Visit Number 5    Authorization - Number of Visits 12    Progress Note Due on Visit 10    PT Start Time 0930    PT Stop Time 1026    PT Time Calculation (min) 56 min    Activity Tolerance Patient tolerated treatment well;No increased pain    Behavior During Therapy [facility] for tasks assessed/performed             Past Medical History:  Diagnosis Date   anal ca dx'd 11/2011   squamous cell carcinoma   Anxiety    Arthritis 2022   Depression    Full dentures    Hemorrhoids    external   History of radiation therapy 11/29/11 to 01/05/12   anal canal 5040 cGy 28 sessions, regional lymph nodes 4200 cGy 28 sessions   Hyperlipidemia    Rectal bleeding    Vitamin D deficiency    Wears glasses     Past Surgical History:  Procedure Laterality Date   ABDOMINAL HYSTERECTOMY  1978   age 75   BUBBLE STUDY  12/04/2020   Procedure: BUBBLE STUDY;  Surgeon: [name], MD;  Location: [place];  Service: Cardiology;;   EXAMINATION UNDER ANESTHESIA  06/21/2012   Procedure: EXAM UNDER ANESTHESIA;  Surgeon: [name], MD;  Location: [place];  Service: General;  Laterality: N/A;  rectal exam under anesthesia, anal dilation, rectal biopsy   [place]   Patient had to guess on the date.   RECTAL BIOPSY  06/21/2012   Procedure: BIOPSY RECTAL;  Surgeon: [name], MD;  Location: [place];  Service: General;   Laterality: N/A;   TEE WITHOUT CARDIOVERSION N/A 12/04/2020   Procedure: TRANSESOPHAGEAL ECHOCARDIOGRAM (TEE);  Surgeon: [name], MD;  Location: [place];  Service: Cardiology;  Laterality: N/A;   TOTAL KNEE ARTHROPLASTY Right 06/09/2021   Procedure: RIGHT TOTAL KNEE ARTHROPLASTY;  Surgeon: Meredith Pel, MD;  Location: [place];  Service: Orthopedics;  Laterality: Right;   TUBAL LIGATION     b/l  age 75    There were no vitals filed for this visit.   Subjective Assessment - 07/23/21 0936     Subjective [name] reports progress with her pain and self-reported function.  Edema and swellingare noted with prolonged weight-bearing.    Pertinent History Hypothyroid, previous cancer/radiation, lumbar spinal stenosis    Limitations House hold activities;Standing;Walking    How long can you sit comfortably? Start-up stiffness    How long can you stand comfortably? 30 minutes with cane    How long can you walk comfortably? 5-10 minutes without the cane    Patient Stated Goals Walk better and have less pain    Currently in Pain? Yes    Pain Score 4     Pain Location Knee    Pain Orientation Right    Pain Descriptors /  Indicators Tightness    Pain Type Chronic pain;Surgical pain    Pain Radiating Towards NA    Pain Onset More than a month ago    Pain Frequency Intermittent    Aggravating Factors  Too much WB    Pain Relieving Factors Rest and ice    Effect of Pain on Daily Activities Uses cane for longer walks, limited WB endurance    Multiple Pain Sites No                [facility] PT Assessment - 07/23/21 0001       AROM   Overall AROM  Deficits    Overall AROM Comments Supine    AROM Assessment Site Knee    Right Knee Extension -2    Right Knee Flexion 115                           [facility] Adult PT Treatment/Exercise - 07/23/21 0001       Exercises   Exercises Knee/Hip      Knee/Hip Exercises: Aerobic   Recumbent Bike Seat 7 and 6 for 4  minutes each      Knee/Hip Exercises: Machines for Strengthening   Cybex Knee Extension 90-30 degrees with slow eccentrics up with both, down R only 10# 2 sets of 10      Knee/Hip Exercises: Seated   Knee/Hip Flexion Tailgate knee flexion AROM 1 minute & knee flexion AAROM (L pushes R into flexion) 10X 10 seconds      Knee/Hip Exercises: Supine   Quad Sets Strengthening;Both;3 sets;10 reps;Limitations    Quad Sets Limitations 5 seconds (toes back, press knees down and tighten thighs)      Modalities   Modalities Vasopneumatic      Vasopneumatic   Number Minutes Vasopneumatic  10 minutes    Vasopnuematic Location  Knee    Vasopneumatic Pressure Medium    Vasopneumatic Temperature  34*                     PT Education - 07/23/21 1028     Education Details Reviewed day 1 HEP and RA findings.    Person(s) Educated Patient    Methods Explanation;Demonstration;Tactile cues;Verbal cues;Handout    Comprehension Verbal cues required;Returned demonstration;Need further instruction;Tactile cues required;Verbalized understanding              PT Short Term Goals - 07/23/21 1028       PT SHORT TERM GOAL #1   Title Layia will have R knee AROM at -2 to 100 degrees or better.    Baseline -2 to 115 degrees 07/23/2021    Time 4    Period Weeks    Status Achieved    Target Date 08/05/21      PT SHORT TERM GOAL #2   Title Lilyan will be independent with her week 1 HEP.    Time 4    Period Weeks    Status On-going    Target Date 08/05/21               PT Long Term Goals - 07/23/21 1029       PT LONG TERM GOAL #1   Title Improve FOTO to 59.    Baseline 33    Time 12    Period Weeks    Status On-going    Target Date 09/30/21      PT LONG TERM GOAL #2   Title  Renesmay will report R knee pain consistently 0-4/10 on the Numeric Pain Rating Scale.    Baseline 2-4/10 (was 5-7/10)    Time 12    Period Weeks    Status Achieved      PT LONG TERM GOAL #3    Title Improve R knee AROM to 0-110 degrees    Baseline -2 to 115 degrees    Time 12    Period Weeks    Status Partially Met    Target Date 09/30/21      PT LONG TERM GOAL #4   Title Improve R quadriceps strength to at least 50 pounds.    Baseline 25.4 pounds    Time 12    Period Weeks    Status On-going    Target Date 09/30/21      PT LONG TERM GOAL #5   Title Ashima will be independent with her long-term HEP at [place].    Time 12    Period Weeks    Status On-going    Target Date 09/30/21                   Plan - 07/23/21 1031     Clinical Impression Statement Makayah is making great early objective progress with her PT.  -2 extension is due to joint edema and flexion is 115 degrees.  She is using a cane (was walker) and is using less pain medication.  She is on track to meet all LTGs established visit 1.    Personal Factors and Comorbidities Comorbidity 3+    Comorbidities Hypothyroid, previous cancer/radiation, lumbar spinal stenosis    Examination-Activity Limitations Bathing;Dressing;Bed Mobility;Hygiene/Grooming;Sleep;Squat;Lift;Bend;Locomotion Level;Stairs;Stand    Examination-Participation Restrictions Occupation;Cleaning;Laundry;Community Activity;Driving    Stability/Clinical Decision Making Stable/Uncomplicated    Rehab Potential Good    PT Frequency 3x / week    PT Duration 8 weeks    PT Treatment/Interventions ADLs/Self Care Home Management;Electrical Stimulation;Cryotherapy;Therapeutic activities;Stair training;Gait training;Therapeutic exercise;Neuromuscular re-education;Patient/family education;Manual techniques;Vasopneumatic Device    PT Next Visit Plan Quadriceps strength, balance, gait, stairs    PT Home Exercise Plan [phone]    Consulted and Agree with Plan of Care Patient             Patient will benefit from skilled therapeutic intervention in order to improve the following deficits and impairments:  Abnormal gait, Decreased activity tolerance,  Decreased endurance, Decreased range of motion, Decreased strength, Difficulty walking, Increased edema, Impaired flexibility, Pain  Visit Diagnosis: Difficulty walking  Muscle weakness (generalized)  Localized edema  Stiffness of right knee, not elsewhere classified  Chronic pain of right knee     Problem List Patient Active Problem List   Diagnosis Date Noted   Arthritis of right knee    UTI (urinary tract infection) 06/26/2021   Severe sepsis ([place]) 58/30/9407   Acute metabolic encephalopathy 68/06/8109   Hypothyroid 06/25/2021   S/P total knee arthroplasty, right 06/09/2021   Fever    Staphylococcus aureus bacteremia with sepsis ([facility])    SIRS (systemic inflammatory response syndrome) ([place]) 11/30/2020   Lumbar radiculopathy 10/10/2019   Bilateral stenosis of lateral recess of lumbar spine 10/10/2019   Hemorrhoids, external without complications 31/59/4585   Depression    Anxiety    Rectal bleeding    History of radiation therapy    Malignant neoplasm of anal canal ([place]) 11/15/2011   Hemorrhoids, internal, with bleeding 09/07/2011   Acquired anal stenosis 09/07/2011   Hyperlipidemia 09/07/2011   Family history of breast cancer in sister 09/07/2011  Farley Ly, PT, MPT 07/23/2021, 10:33 AM  [facility] [address] [place], [place], [address] Phone: [phone]   Fax:  [phone]  Name: ODESSIE POLZIN MRN: [id number] Date of Birth: [birth year]

## 2021-07-24 ENCOUNTER — Encounter: Payer: Medicare HMO | Admitting: Rehabilitative and Restorative Service Providers"

## 2021-07-29 ENCOUNTER — Encounter: Payer: Self-pay | Admitting: Rehabilitative and Restorative Service Providers"

## 2021-07-29 ENCOUNTER — Other Ambulatory Visit: Payer: Self-pay

## 2021-07-29 ENCOUNTER — Ambulatory Visit (INDEPENDENT_AMBULATORY_CARE_PROVIDER_SITE_OTHER): Payer: Medicare HMO | Admitting: Rehabilitative and Restorative Service Providers"

## 2021-07-29 DIAGNOSIS — R600 Localized edema: Secondary | ICD-10-CM

## 2021-07-29 DIAGNOSIS — M25661 Stiffness of right knee, not elsewhere classified: Secondary | ICD-10-CM

## 2021-07-29 DIAGNOSIS — R262 Difficulty in walking, not elsewhere classified: Secondary | ICD-10-CM | POA: Diagnosis not present

## 2021-07-29 DIAGNOSIS — M6281 Muscle weakness (generalized): Secondary | ICD-10-CM

## 2021-07-29 DIAGNOSIS — M25561 Pain in right knee: Secondary | ICD-10-CM

## 2021-07-29 DIAGNOSIS — G8929 Other chronic pain: Secondary | ICD-10-CM

## 2021-07-29 NOTE — Patient Instructions (Signed)
Access Code: [email or web address] URL: [email or web address] Date: 07/29/2021 Prepared by: Vista Mink  Exercises Supine Quadricep Sets - 2-3 x daily - 7 x weekly - 2-3 sets - 10 reps - 5 second hold Seated Knee Flexion Extension AROM - 3 x daily - 7 x weekly - 1 sets - 1 reps - 3 minutes hold Tandem Stance - 2 x daily - 7 x weekly - 1 sets - 5 reps - 20 second hold Standing Hip Hiking - 2 x daily - 7 x weekly - 1 sets - 10 reps - 3 seconds hold

## 2021-07-29 NOTE — Therapy (Addendum)
[facility] [address] [place], [place], [address] Phone: [phone]   Fax:  [phone]  Physical Therapy Treatment/Progress Note  Progress Note Reporting Period 07/08/2021 to 07/29/2021  See note below for Objective Data and Assessment of Progress/Goals.     Patient Details  Name: NAIOMI MUSTO MRN: [id number] Date of Birth: 11/18/1945 Referring Provider (PT): Meredith Pel MD  No notes on file Encounter Date: 07/29/2021   PT End of Session - 07/29/21 1734     Visit Number 6    Number of Visits 24    Date for PT Re-Evaluation 09/30/21    Authorization Type [employer] MCR    Authorization Time Period 9/7-11/7    Authorization - Visit Number 5    Authorization - Number of Visits 12    Progress Note Due on Visit 10    PT Start Time 1109    PT Stop Time 1157    PT Time Calculation (min) 48 min    Activity Tolerance Patient tolerated treatment well;No increased pain    Behavior During Therapy [facility] for tasks assessed/performed             Past Medical History:  Diagnosis Date   anal ca dx'd 11/2011   squamous cell carcinoma   Anxiety    Arthritis 2022   Depression    Full dentures    Hemorrhoids    external   History of radiation therapy 11/29/11 to 01/05/12   anal canal 5040 cGy 28 sessions, regional lymph nodes 4200 cGy 28 sessions   Hyperlipidemia    Rectal bleeding    Vitamin D deficiency    Wears glasses     Past Surgical History:  Procedure Laterality Date   ABDOMINAL HYSTERECTOMY  1978   age 39   BUBBLE STUDY  12/04/2020   Procedure: BUBBLE STUDY;  Surgeon: [name], MD;  Location: [place];  Service: Cardiology;;   EXAMINATION UNDER ANESTHESIA  06/21/2012   Procedure: EXAM UNDER ANESTHESIA;  Surgeon: [name], MD;  Location: [place];  Service: General;  Laterality: N/A;  rectal exam under anesthesia, anal dilation, rectal biopsy   [place]   Patient had to  guess on the date.   RECTAL BIOPSY  06/21/2012   Procedure: BIOPSY RECTAL;  Surgeon: [name], MD;  Location: [place];  Service: General;  Laterality: N/A;   TEE WITHOUT CARDIOVERSION N/A 12/04/2020   Procedure: TRANSESOPHAGEAL ECHOCARDIOGRAM (TEE);  Surgeon: [name], MD;  Location: [place];  Service: Cardiology;  Laterality: N/A;   TOTAL KNEE ARTHROPLASTY Right 06/09/2021   Procedure: RIGHT TOTAL KNEE ARTHROPLASTY;  Surgeon: Meredith Pel, MD;  Location: [place];  Service: Orthopedics;  Laterality: Right;   TUBAL LIGATION     b/l  age 57    There were no vitals filed for this visit.   Subjective Assessment - 07/29/21 1117     Subjective Marlena notes continued progress on a daily and weekly basis.  She does have some L hip/gluteal soreness likely due to gait compensation.    Pertinent History Hypothyroid, previous cancer/radiation, lumbar spinal stenosis    Limitations House hold activities;Standing;Walking    How long can you sit comfortably? Start-up stiffness    How long can you stand comfortably? 30 minutes with cane    How long can you walk comfortably? 5-10 minutes without the cane    Patient Stated Goals Walk better and have less pain  Currently in Pain? Yes    Pain Score 2     Pain Location Knee    Pain Orientation Right    Pain Descriptors / Indicators Tightness    Pain Type Chronic pain;Surgical pain    Pain Radiating Towards NA    Pain Onset More than a month ago    Pain Frequency Rarely    Aggravating Factors  Prolonged postures and too much WB    Pain Relieving Factors Rest and ice    Effect of Pain on Daily Activities Uses cane outside the house, limited WB endurance    Multiple Pain Sites No                [facility] PT Assessment - 07/29/21 0001       Observation/Other Assessments   Focus on Therapeutic Outcomes (FOTO)  63 (was 33, Goal met)      AROM   Overall AROM  Deficits    Overall AROM Comments Supine     AROM Assessment Site Knee    Right Knee Extension -2    Right Knee Flexion 116                           [facility] Adult PT Treatment/Exercise - 07/29/21 0001       Neuro Re-ed    Neuro Re-ed Details  Tandem balance 5X 20 seconds      Exercises   Exercises Knee/Hip      Knee/Hip Exercises: Aerobic   Recumbent Bike Seat 7 and 6 for 4 minutes each      Knee/Hip Exercises: Standing   Other Standing Knee Exercises Alternating hip hike 2 sets of 10 for 3 seconds      Knee/Hip Exercises: Seated   Knee/Hip Flexion Tailgate knee flexion AROM 1 minute & knee flexion AAROM (L pushes R into flexion) 10X 10 seconds      Knee/Hip Exercises: Supine   Quad Sets Strengthening;Both;3 sets;10 reps;Limitations    Quad Sets Limitations 5 seconds (toes back, press knees down and tighten thighs)      Modalities   Modalities Vasopneumatic      Vasopneumatic   Number Minutes Vasopneumatic  10 minutes    Vasopnuematic Location  Knee    Vasopneumatic Pressure Medium    Vasopneumatic Temperature  34*                     PT Education - 07/29/21 1731     Education Details Reviewed HEP with addition of proprioception and hip strength.    Person(s) Educated Patient    Methods Explanation;Demonstration;Verbal cues;Handout    Comprehension Verbal cues required;Need further instruction;Returned demonstration;Verbalized understanding              PT Short Term Goals - 07/29/21 1732       PT SHORT TERM GOAL #1   Title Anayla will have R knee AROM at -2 to 100 degrees or better.    Baseline -2 to 115 degrees 07/23/2021    Time 4    Period Weeks    Status Achieved    Target Date 08/05/21      PT SHORT TERM GOAL #2   Title Lakesha will be independent with her week 1 HEP.    Time 4    Period Weeks    Status Achieved    Target Date 08/05/21               PT  Long Term Goals - 07/29/21 1733       PT LONG TERM GOAL #1   Title Improve FOTO to 59.    Baseline  63 (was 33)    Time 12    Period Weeks    Status Achieved      PT LONG TERM GOAL #2   Title Marilyn will report R knee pain consistently 0-4/10 on the Numeric Pain Rating Scale.    Baseline 2-4/10 (was 5-7/10)    Time 12    Period Weeks    Status Achieved      PT LONG TERM GOAL #3   Title Improve R knee AROM to 0-110 degrees    Baseline -2 to 116 degrees    Time 12    Period Weeks    Status Partially Met    Target Date 09/30/21      PT LONG TERM GOAL #4   Title Improve R quadriceps strength to at least 50 pounds.    Baseline 25.4 pounds    Time 12    Period Weeks    Status On-going    Target Date 09/30/21      PT LONG TERM GOAL #5   Title Hae will be independent with her long-term HEP at DC.    Time 12    Period Weeks    Status On-going                   Plan - 07/29/21 1734     Clinical Impression Statement Kanija is way ahead of schedule.  AROM is excellent with only a minor edema related extension deficit (2 degrees).  Quadriceps strength, balance and more functional activities are priorities moving forward.    Personal Factors and Comorbidities Comorbidity 3+    Comorbidities Hypothyroid, previous cancer/radiation, lumbar spinal stenosis    Examination-Activity Limitations Bathing;Dressing;Bed Mobility;Hygiene/Grooming;Sleep;Squat;Lift;Bend;Locomotion Level;Stairs;Stand    Examination-Participation Restrictions Occupation;Cleaning;Laundry;Community Activity;Driving    Stability/Clinical Decision Making Stable/Uncomplicated    Rehab Potential Good    PT Frequency 3x / week    PT Duration 8 weeks    PT Treatment/Interventions ADLs/Self Care Home Management;Electrical Stimulation;Cryotherapy;Therapeutic activities;Stair training;Gait training;Therapeutic exercise;Neuromuscular re-education;Patient/family education;Manual techniques;Vasopneumatic Device    PT Next Visit Plan Quadriceps strength, balance, gait, stairs    PT Home Exercise Plan [id number]     Consulted and Agree with Plan of Care Patient             Patient will benefit from skilled therapeutic intervention in order to improve the following deficits and impairments:  Abnormal gait, Decreased activity tolerance, Decreased endurance, Decreased range of motion, Decreased strength, Difficulty walking, Increased edema, Impaired flexibility, Pain  Visit Diagnosis: Difficulty walking  Muscle weakness (generalized)  Localized edema  Stiffness of right knee, not elsewhere classified  Chronic pain of right knee     Problem List Patient Active Problem List   Diagnosis Date Noted   Arthritis of right knee    UTI (urinary tract infection) 06/26/2021   Severe sepsis ([place]) 58/52/7782   Acute metabolic encephalopathy 42/35/3614   Hypothyroid 06/25/2021   S/P total knee arthroplasty, right 06/09/2021   Fever    Staphylococcus aureus bacteremia with sepsis (HCC)    SIRS (systemic inflammatory response syndrome) ([place]) 11/30/2020   Lumbar radiculopathy 10/10/2019   Bilateral stenosis of lateral recess of lumbar spine 10/10/2019   Hemorrhoids, external without complications 43/15/4008   Depression    Anxiety    Rectal bleeding    History of radiation therapy  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

## 2021-07-31 ENCOUNTER — Ambulatory Visit (INDEPENDENT_AMBULATORY_CARE_PROVIDER_SITE_OTHER): Payer: Medicare HMO | Admitting: Rehabilitative and Restorative Service Providers"

## 2021-07-31 ENCOUNTER — Other Ambulatory Visit: Payer: Self-pay

## 2021-07-31 ENCOUNTER — Encounter: Payer: Self-pay | Admitting: Rehabilitative and Restorative Service Providers"

## 2021-07-31 DIAGNOSIS — R262 Difficulty in walking, not elsewhere classified: Secondary | ICD-10-CM

## 2021-07-31 DIAGNOSIS — R600 Localized edema: Secondary | ICD-10-CM

## 2021-07-31 DIAGNOSIS — M25561 Pain in right knee: Secondary | ICD-10-CM

## 2021-07-31 DIAGNOSIS — M6281 Muscle weakness (generalized): Secondary | ICD-10-CM | POA: Diagnosis not present

## 2021-07-31 DIAGNOSIS — M25661 Stiffness of right knee, not elsewhere classified: Secondary | ICD-10-CM | POA: Diagnosis not present

## 2021-07-31 DIAGNOSIS — G8929 Other chronic pain: Secondary | ICD-10-CM

## 2021-07-31 NOTE — Patient Instructions (Signed)
No changes to HEP

## 2021-07-31 NOTE — Therapy (Signed)
[facility] [address] [place], [place], [address] Phone: [phone]   Fax:  [phone]  Physical Therapy Treatment  Patient Details  Name: [name]: [id number] Date of Birth: 14-May-1946 Referring Provider (PT): Meredith Pel MD   Encounter Date: 07/31/2021   PT End of Session - 07/31/21 1021     Visit Number 7    Number of Visits 24    Date for PT Re-Evaluation 09/30/21    Authorization Type [employer] MCR    Authorization Time Period 9/7-11/7    Authorization - Visit Number 7    Authorization - Number of Visits 12    Progress Note Due on Visit 16    PT Start Time 0930    PT Stop Time 1025    PT Time Calculation (min) 55 min    Activity Tolerance Patient tolerated treatment well;No increased pain    Behavior During Therapy [facility] for tasks assessed/performed             Past Medical History:  Diagnosis Date   anal ca dx'd 11/2011   squamous cell carcinoma   Anxiety    Arthritis 2022   Depression    Full dentures    Hemorrhoids    external   History of radiation therapy 11/29/11 to 01/05/12   anal canal 5040 cGy 28 sessions, regional lymph nodes 4200 cGy 28 sessions   Hyperlipidemia    Rectal bleeding    Vitamin D deficiency    Wears glasses     Past Surgical History:  Procedure Laterality Date   ABDOMINAL HYSTERECTOMY  1978   age 57   BUBBLE STUDY  12/04/2020   Procedure: BUBBLE STUDY;  Surgeon: [name], MD;  Location: [place];  Service: Cardiology;;   EXAMINATION UNDER ANESTHESIA  06/21/2012   Procedure: EXAM UNDER ANESTHESIA;  Surgeon: [name], MD;  Location: [place];  Service: General;  Laterality: N/A;  rectal exam under anesthesia, anal dilation, rectal biopsy   [place]   Patient had to guess on the date.   RECTAL BIOPSY  06/21/2012   Procedure: BIOPSY RECTAL;  Surgeon: [name], MD;  Location: [place];  Service: General;   Laterality: N/A;   TEE WITHOUT CARDIOVERSION N/A 12/04/2020   Procedure: TRANSESOPHAGEAL ECHOCARDIOGRAM (TEE);  Surgeon: [name], MD;  Location: [place];  Service: Cardiology;  Laterality: N/A;   TOTAL KNEE ARTHROPLASTY Right 06/09/2021   Procedure: RIGHT TOTAL KNEE ARTHROPLASTY;  Surgeon: Meredith Pel, MD;  Location: [place];  Service: Orthopedics;  Laterality: Right;   TUBAL LIGATION     b/l  age 22    There were no vitals filed for this visit.   Subjective Assessment - 07/31/21 0940     Subjective [name] continues her excellent HEP compliance.  Pain meds 1X/day but it doesn't sound like she needs them.    Pertinent History Hypothyroid, previous cancer/radiation, lumbar spinal stenosis    Limitations House hold activities;Standing;Walking    How long can you sit comfortably? Start-up stiffness    How long can you stand comfortably? 30 minutes with cane    How long can you walk comfortably? 5-10 minutes without the cane    Patient Stated Goals Walk better and have less pain    Currently in Pain? No/denies    Pain Score 0-No pain    Pain Location Knee    Pain Orientation Right    Pain Descriptors /  Indicators Tightness    Pain Type Chronic pain;Surgical pain    Pain Radiating Towards NA    Pain Onset More than a month ago    Pain Frequency Rarely    Aggravating Factors  Prolonged postures and too much WB.    Pain Relieving Factors Rest and ice.    Effect of Pain on Daily Activities No more cane needed.  Has to pace herself to avoid increasing edema and pain.    Multiple Pain Sites No                               [facility] Adult PT Treatment/Exercise - 07/31/21 0001       Neuro Re-ed    Neuro Re-ed Details  Tandem balance 5X 20 seconds      Exercises   Exercises Knee/Hip      Knee/Hip Exercises: Aerobic   Recumbent Bike Seat 7 and 6 for 4 minutes each      Knee/Hip Exercises: Machines for Strengthening   Cybex Knee Extension 90-30  degrees with slow eccentrics up with both, down R only 10# 2 sets of 10      Knee/Hip Exercises: Standing   Other Standing Knee Exercises Alternating hip hike 2 sets of 10 for 3 seconds      Knee/Hip Exercises: Supine   Quad Sets Strengthening;Both;3 sets;10 reps;Limitations    Quad Sets Limitations 5 seconds (toes back, press knees down and tighten thighs)      Modalities   Modalities Vasopneumatic      Vasopneumatic   Number Minutes Vasopneumatic  10 minutes    Vasopnuematic Location  Knee    Vasopneumatic Pressure Medium    Vasopneumatic Temperature  34*                     PT Education - 07/31/21 1020     Education Details Reviewed HEP with corrections as needed (particularly balance and hip hike exercises).    Person(s) Educated Patient    Methods Explanation;Demonstration;Tactile cues;Verbal cues    Comprehension Verbal cues required;Need further instruction;Returned demonstration;Verbalized understanding;Tactile cues required              PT Short Term Goals - 07/29/21 1732       PT SHORT TERM GOAL #1   Title [name] will have R knee AROM at -2 to 100 degrees or better.    Baseline -2 to 115 degrees 07/23/2021    Time 4    Period Weeks    Status Achieved    Target Date 08/05/21      PT SHORT TERM GOAL #2   Title [name] will be independent with her week 1 HEP.    Time 4    Period Weeks    Status Achieved    Target Date 08/05/21               PT Long Term Goals - 07/29/21 1733       PT LONG TERM GOAL #1   Title Improve FOTO to 59.    Baseline 63 (was 33)    Time 12    Period Weeks    Status Achieved      PT LONG TERM GOAL #2   Title [name] will report R knee pain consistently 0-4/10 on the Numeric Pain Rating Scale.    Baseline 2-4/10 (was 5-7/10)    Time 12    Period Weeks  Status Achieved      PT LONG TERM GOAL #3   Title Improve R knee AROM to 0-110 degrees    Baseline -2 to 116 degrees    Time 12    Period Weeks     Status Partially Met    Target Date 09/30/21      PT LONG TERM GOAL #4   Title Improve R quadriceps strength to at least 50 pounds.    Baseline 25.4 pounds    Time 12    Period Weeks    Status On-going    Target Date 09/30/21      PT LONG TERM GOAL #5   Title [name] will be independent with her long-term HEP at [place].    Time 12    Period Weeks    Status On-going                   Plan - 07/31/21 1021     Clinical Impression Statement [name] continues to give great effort with her home and clinic activities.  Balance and hip abductors strength were emphasized today along with continued quadriceps strengthening and R knee AROM.  Continue current plan to prepare for more independent rehabilitation.    Personal Factors and Comorbidities Comorbidity 3+    Comorbidities Hypothyroid, previous cancer/radiation, lumbar spinal stenosis    Examination-Activity Limitations Bathing;Dressing;Bed Mobility;Hygiene/Grooming;Sleep;Squat;Lift;Bend;Locomotion Level;Stairs;Stand    Examination-Participation Restrictions Occupation;Cleaning;Laundry;Community Activity;Driving    Stability/Clinical Decision Making Stable/Uncomplicated    Rehab Potential Good    PT Frequency 3x / week    PT Duration 8 weeks    PT Treatment/Interventions ADLs/Self Care Home Management;Electrical Stimulation;Cryotherapy;Therapeutic activities;Stair training;Gait training;Therapeutic exercise;Neuromuscular re-education;Patient/family education;Manual techniques;Vasopneumatic Device    PT Next Visit Plan Quadriceps strength, balance, gait, stairs    PT Home Exercise Plan [phone]    Consulted and Agree with Plan of Care Patient             Patient will benefit from skilled therapeutic intervention in order to improve the following deficits and impairments:  Abnormal gait, Decreased activity tolerance, Decreased endurance, Decreased range of motion, Decreased strength, Difficulty walking, Increased edema, Impaired  flexibility, Pain  Visit Diagnosis: Difficulty walking  Muscle weakness (generalized)  Localized edema  Stiffness of right knee, not elsewhere classified  Chronic pain of right knee     Problem List Patient Active Problem List   Diagnosis Date Noted   Arthritis of right knee    UTI (urinary tract infection) 06/26/2021   Severe sepsis ([place]) 59/08/2889   Acute metabolic encephalopathy 22/84/0698   Hypothyroid 06/25/2021   S/P total knee arthroplasty, right 06/09/2021   Fever    Staphylococcus aureus bacteremia with sepsis (HCC)    SIRS (systemic inflammatory response syndrome) ([place]) 11/30/2020   Lumbar radiculopathy 10/10/2019   Bilateral stenosis of lateral recess of lumbar spine 10/10/2019   Hemorrhoids, external without complications 61/48/3073   Depression    Anxiety    Rectal bleeding    History of radiation therapy    Malignant neoplasm of anal canal ([place]) 11/15/2011   Hemorrhoids, internal, with bleeding 09/07/2011   Acquired anal stenosis 09/07/2011   Hyperlipidemia 09/07/2011   Family history of breast cancer in sister 09/07/2011    Farley Ly, PT, MPT 07/31/2021, 10:27 AM  [facility] [address] [place], [place], [address] Phone: [phone]   Fax:  [phone]  Name: [name]: [id number] Date of Birth: Jan 08, 1946

## 2021-08-03 ENCOUNTER — Encounter: Payer: Self-pay | Admitting: Physical Therapy

## 2021-08-03 ENCOUNTER — Ambulatory Visit (INDEPENDENT_AMBULATORY_CARE_PROVIDER_SITE_OTHER): Payer: Medicare HMO | Admitting: Physical Therapy

## 2021-08-03 ENCOUNTER — Other Ambulatory Visit: Payer: Self-pay

## 2021-08-03 DIAGNOSIS — R262 Difficulty in walking, not elsewhere classified: Secondary | ICD-10-CM | POA: Diagnosis not present

## 2021-08-03 DIAGNOSIS — M6281 Muscle weakness (generalized): Secondary | ICD-10-CM

## 2021-08-03 DIAGNOSIS — M25661 Stiffness of right knee, not elsewhere classified: Secondary | ICD-10-CM

## 2021-08-03 DIAGNOSIS — M25561 Pain in right knee: Secondary | ICD-10-CM

## 2021-08-03 DIAGNOSIS — R600 Localized edema: Secondary | ICD-10-CM | POA: Diagnosis not present

## 2021-08-03 DIAGNOSIS — G8929 Other chronic pain: Secondary | ICD-10-CM

## 2021-08-03 NOTE — Therapy (Signed)
[facility] [address] [place], [place], [address] Phone: [phone]   Fax:  [phone]  Physical Therapy Treatment  Patient Details  Name: [name]: [id number] Date of Birth: 17-Mar-1946 Referring Provider (PT): Meredith Pel MD   Encounter Date: 08/03/2021   PT End of Session - 08/03/21 0947     Visit Number 8    Number of Visits 24    Date for PT Re-Evaluation 09/30/21    Authorization Type [employer] MCR    Authorization Time Period 9/7-11/7    Authorization - Visit Number 8    Authorization - Number of Visits 12    Progress Note Due on Visit 16    PT Start Time 0931    PT Stop Time 1010    PT Time Calculation (min) 39 min    Activity Tolerance Patient tolerated treatment well;No increased pain    Behavior During Therapy [facility] for tasks assessed/performed             Past Medical History:  Diagnosis Date   anal ca dx'd 11/2011   squamous cell carcinoma   Anxiety    Arthritis 2022   Depression    Full dentures    Hemorrhoids    external   History of radiation therapy 11/29/11 to 01/05/12   anal canal 5040 cGy 28 sessions, regional lymph nodes 4200 cGy 28 sessions   Hyperlipidemia    Rectal bleeding    Vitamin D deficiency    Wears glasses     Past Surgical History:  Procedure Laterality Date   ABDOMINAL HYSTERECTOMY  1978   age 22   BUBBLE STUDY  12/04/2020   Procedure: BUBBLE STUDY;  Surgeon: [name], MD;  Location: [place];  Service: Cardiology;;   EXAMINATION UNDER ANESTHESIA  06/21/2012   Procedure: EXAM UNDER ANESTHESIA;  Surgeon: [name], MD;  Location: [place];  Service: General;  Laterality: N/A;  rectal exam under anesthesia, anal dilation, rectal biopsy   [place]   Patient had to guess on the date.   RECTAL BIOPSY  06/21/2012   Procedure: BIOPSY RECTAL;  Surgeon: [name], MD;  Location: [place];  Service: General;   Laterality: N/A;   TEE WITHOUT CARDIOVERSION N/A 12/04/2020   Procedure: TRANSESOPHAGEAL ECHOCARDIOGRAM (TEE);  Surgeon: [name], MD;  Location: [place];  Service: Cardiology;  Laterality: N/A;   TOTAL KNEE ARTHROPLASTY Right 06/09/2021   Procedure: RIGHT TOTAL KNEE ARTHROPLASTY;  Surgeon: Meredith Pel, MD;  Location: [place];  Service: Orthopedics;  Laterality: Right;   TUBAL LIGATION     b/l  age 5    There were no vitals filed for this visit.       [facility] PT Assessment - 08/03/21 0001       AROM   Right Knee Extension -2    Right Knee Flexion 120                           [facility] Adult PT Treatment/Exercise - 08/03/21 0001       Neuro Re-ed    Neuro Re-ed Details  Airex: feet together x 30 seconds with intermittent finger support on TM bar, 1/2 tandum stance with each foot forward with intermittent finger tip support, standing with feet apart with eyes closed with close SBA all exercises x 30 seconds.      Exercises   Exercises  Knee/Hip      Knee/Hip Exercises: Aerobic   Recumbent Bike bike/UBE L3 x 8 minutes      Knee/Hip Exercises: Machines for Strengthening   Total Gym Leg Press 50# bilateral LE 3x10, Rt LE only: 25# 3x10      Knee/Hip Exercises: Standing   Forward Step Up 15 reps;Hand Hold: 1;Step Height: 6"      Knee/Hip Exercises: Seated   Long Arc Quad Strengthening;Right;20 reps;Weights    Long Arc Quad Weight 5 lbs.   holding 5 seconds each   Sit to Sand 10 reps;2 sets;with UE support   standar chair with arm rests pt requiring single UE support for push off x 10     Knee/Hip Exercises: Supine   Quad Sets Strengthening;Both;3 sets;10 reps;Limitations    Quad Sets Limitations 5 seconds pulling toes toward nose    Other Supine Knee/Hip Exercises heel slides x 10 before measuring                       PT Short Term Goals - 07/29/21 1732       PT SHORT TERM GOAL #1   Title Heavenleigh will have R knee AROM at -2  to 100 degrees or better.    Baseline -2 to 115 degrees 07/23/2021    Time 4    Period Weeks    Status Achieved    Target Date 08/05/21      PT SHORT TERM GOAL #2   Title Shemiah will be independent with her week 1 HEP.    Time 4    Period Weeks    Status Achieved    Target Date 08/05/21               PT Long Term Goals - 08/03/21 0955       PT LONG TERM GOAL #1   Status Achieved      PT LONG TERM GOAL #2   Title Lucee will report R knee pain consistently 0-4/10 on the Numeric Pain Rating Scale.    Status Achieved      PT LONG TERM GOAL #3   Status On-going      PT LONG TERM GOAL #4   Title Improve R quadriceps strength to at least 50 pounds.    Status On-going      PT LONG TERM GOAL #5   Title Asees will be independent with her long-term HEP at DC.    Status On-going                   Plan - 08/03/21 0948     Clinical Impression Statement Pt arriving today reporting no pain in her right knee. Pt tolerating exercises for strengthening and AROM. Pt stating she has been doing her HEP but has not been doing the 100 reps that her last therapist recommended. Pt stating she is doing them throughout the day.Pt still requiring moderate UE support with stepping onto 6 inch step.  Continue skilled PT to maximixe pt's funciton.    Personal Factors and Comorbidities Comorbidity 3+    Comorbidities Hypothyroid, previous cancer/radiation, lumbar spinal stenosis    Examination-Activity Limitations Bathing;Dressing;Bed Mobility;Hygiene/Grooming;Sleep;Squat;Lift;Bend;Locomotion Level;Stairs;Stand    Examination-Participation Restrictions Occupation;Cleaning;Laundry;Community Activity;Driving    Stability/Clinical Decision Making Stable/Uncomplicated    Rehab Potential Good    PT Frequency 3x / week    PT Duration 8 weeks    PT Treatment/Interventions ADLs/Self Care Home Management;Electrical Stimulation;Cryotherapy;Therapeutic activities;Stair training;Gait  training;Therapeutic exercise;Neuromuscular re-education;Patient/family education;Manual techniques;Vasopneumatic  Device    PT Next Visit Plan continue with Quadriceps strength, balance, gait, stairs    PT Home Exercise Plan [phone]    Consulted and Agree with Plan of Care Patient             Patient will benefit from skilled therapeutic intervention in order to improve the following deficits and impairments:  Abnormal gait, Decreased activity tolerance, Decreased endurance, Decreased range of motion, Decreased strength, Difficulty walking, Increased edema, Impaired flexibility, Pain  Visit Diagnosis: Difficulty walking  Muscle weakness (generalized)  Localized edema  Stiffness of right knee, not elsewhere classified  Chronic pain of right knee     Problem List Patient Active Problem List   Diagnosis Date Noted   Arthritis of right knee    UTI (urinary tract infection) 06/26/2021   Severe sepsis ([place]) 76/28/3151   Acute metabolic encephalopathy 76/16/0737   Hypothyroid 06/25/2021   S/P total knee arthroplasty, right 06/09/2021   Fever    Staphylococcus aureus bacteremia with sepsis ([place])    SIRS (systemic inflammatory response syndrome) ([place]) 11/30/2020   Lumbar radiculopathy 10/10/2019   Bilateral stenosis of lateral recess of lumbar spine 10/10/2019   Hemorrhoids, external without complications 10/62/6948   Depression    Anxiety    Rectal bleeding    History of radiation therapy    Malignant neoplasm of anal canal ([place]) 11/15/2011   Hemorrhoids, internal, with bleeding 09/07/2011   Acquired anal stenosis 09/07/2011   Hyperlipidemia 09/07/2011   Family history of breast cancer in sister 09/07/2011    Oretha Caprice, PT, MPT 08/03/2021, 10:13 AM  [facility] [address] [place], [place], [address] Phone: [phone]   Fax:  [phone]  Name: [name]: [id number] Date of Birth: 10-Mar-1946

## 2021-08-05 ENCOUNTER — Encounter: Payer: Self-pay | Admitting: Surgery

## 2021-08-05 ENCOUNTER — Other Ambulatory Visit: Payer: Self-pay

## 2021-08-05 ENCOUNTER — Ambulatory Visit (INDEPENDENT_AMBULATORY_CARE_PROVIDER_SITE_OTHER): Payer: Medicare HMO | Admitting: Surgery

## 2021-08-05 DIAGNOSIS — M545 Low back pain, unspecified: Secondary | ICD-10-CM

## 2021-08-05 DIAGNOSIS — M5416 Radiculopathy, lumbar region: Secondary | ICD-10-CM

## 2021-08-05 DIAGNOSIS — M48062 Spinal stenosis, lumbar region with neurogenic claudication: Secondary | ICD-10-CM

## 2021-08-05 NOTE — Progress Notes (Signed)
Office Visit Note   [name]           Date of Birth: [birth year]           MRN: [id number] Visit Date: 08/05/2021              Requested by: [name], MD [place],  [place] [address] PCP: [name], MD   Assessment & Plan: Visit Diagnoses:  1. Low back pain, unspecified back pain laterality, unspecified chronicity, unspecified whether sciatica present   2. Radiculopathy, lumbar region   3. Spinal stenosis of lumbar region with neurogenic claudication     Plan: Since patient states that most of her pain is related to back pain and right lower extremity radiculopathy to just above her knee I will schedule consult with Dr. Ernestina Patches to see what he thinks about the possibility of doing ESI's.  Patient also will eventually need Dr. Louanne Skye to review the study to get his opinion.  I advised patient to contact me Monday if she does not hear anything regarding the referral.  All questions answered.  Follow-Up Instructions: Return if symptoms worsen or fail to improve.   Orders:  Orders Placed This Encounter  Procedures   Ambulatory referral to Physical Medicine Rehab    No orders of the defined types were placed in this encounter.     Procedures: No procedures performed   Clinical Data: No additional findings.   Subjective: Chief Complaint  Patient presents with   Lower Back - Pain     HPI 75 year old white female returns to review lumbar MRI scan that was done July 13, 2021.  That study showed:  CLINICAL DATA:  Spinal stenosis of lumbosacral region. Lumbar radiculopathy. Chronic low back pain. Right anterior leg pain in an L3 or L4 distribution. Compression fractures.   EXAM: MRI LUMBAR SPINE WITHOUT CONTRAST   TECHNIQUE: Multiplanar, multisequence MR imaging of the lumbar spine was performed. No intravenous contrast was administered.   COMPARISON:  09/07/2019   FINDINGS: Segmentation: Standard.    Alignment: Unchanged trace anterolisthesis of L4 on L5. 5 mm anterolisthesis of L5 on S1, mildly increased and facet mediated.   Vertebrae: No acute fracture, suspicious marrow lesion, or significant marrow edema. Unchanged chronic L1, L3, and L5 compression fractures. Unchanged T11 superior endplate Schmorl's node. Scattered small vertebral hemangiomas.   Conus medullaris and cauda equina: Conus extends to the L1 level. Conus and cauda equina appear normal.   Paraspinal and other soft tissues: Unchanged subcentimeter T2 hyperintensity in the lower pole of the right kidney, likely a cyst.   Disc levels:   Disc desiccation throughout the lumbar spine. Mild disc space narrowing at L3-4 and L5-S1.   T12-L1: Mild disc bulging and mild facet arthrosis without stenosis, unchanged.   L1-2: Stable to slightly increased disc bulging. Mild facet hypertrophy. No significant stenosis.   L2-3: Disc bulging, endplate spurring, and moderate facet hypertrophy result in mild spinal stenosis and mild bilateral neural foraminal stenosis, unchanged.   L3-4: Disc bulging and severe facet and ligamentum flavum hypertrophy have progressed, and there is a new right foraminal disc protrusion. These findings result in moderate spinal stenosis and severe right and moderate left neural foraminal stenosis. Likely right L3 nerve root impingement.   L4-5: Disc bulging and severe facet hypertrophy result in mild bilateral lateral recess stenosis, borderline spinal stenosis, and borderline bilateral neural foraminal stenosis, unchanged. Bilateral facet ankylosis.   L5-S1: Anterolisthesis with bulging uncovered  disc, a central disc extrusion, and severe facet hypertrophy result in moderate spinal stenosis, moderate bilateral lateral recess stenosis, and moderate bilateral neural foraminal stenosis, progressed from prior.   IMPRESSION: 1. Progressive disc and facet degeneration at L3-4 with a new  right foraminal disc protrusion. Severe right neural foraminal stenosis and moderate spinal stenosis. 2. Progressive, moderate spinal stenosis and moderate bilateral neural foraminal stenosis at L5-S1.     Electronically Signed   By: Logan Bores M.D.   On: 07/13/2021 16:54  Patient dates that she continues have ongoing low back pain that radiates to just above her knee.  Patient just recently had right total knee replacement by Dr. Marlou Sa and this is doing well.    Objective: Vital Signs: There were no vitals taken for this visit.  Physical Exam Gait is somewhat antalgic. Ortho Exam  Specialty Comments:  No specialty comments available.  Imaging: No results found.   PMFS History: Patient Active Problem List   Diagnosis Date Noted   Arthritis of right knee    UTI (urinary tract infection) 06/26/2021   Severe sepsis ([place]) 26/20/3559   Acute metabolic encephalopathy 74/16/3845   Hypothyroid 06/25/2021   S/P total knee arthroplasty, right 06/09/2021   Fever    Staphylococcus aureus bacteremia with sepsis ([place])    SIRS (systemic inflammatory response syndrome) ([place]) 11/30/2020   Lumbar radiculopathy 10/10/2019   Bilateral stenosis of lateral recess of lumbar spine 10/10/2019   Hemorrhoids, external without complications 36/46/8032   Depression    Anxiety    Rectal bleeding    History of radiation therapy    Malignant neoplasm of anal canal ([place]) 11/15/2011   Hemorrhoids, internal, with bleeding 09/07/2011   Acquired anal stenosis 09/07/2011   Hyperlipidemia 09/07/2011   Family history of breast cancer in sister 09/07/2011   Past Medical History:  Diagnosis Date   anal ca dx'd 11/2011   squamous cell carcinoma   Anxiety    Arthritis 2022   Depression    Full dentures    Hemorrhoids    external   History of radiation therapy 11/29/11 to 01/05/12   anal canal 5040 cGy 28 sessions, regional lymph nodes 4200 cGy 28 sessions   Hyperlipidemia    Rectal bleeding     Vitamin D deficiency    Wears glasses     Family History  Problem Relation Age of Onset   Cancer Sister        breast/ age 40   Cervical cancer Sister        14   Alcohol abuse Maternal Uncle     Past Surgical History:  Procedure Laterality Date   ABDOMINAL HYSTERECTOMY  1978   age 72   BUBBLE STUDY  12/04/2020   Procedure: BUBBLE STUDY;  Surgeon: [name], MD;  Location: [place];  Service: Cardiology;;   EXAMINATION UNDER ANESTHESIA  06/21/2012   Procedure: EXAM UNDER ANESTHESIA;  Surgeon: [name], MD;  Location: [place];  Service: General;  Laterality: N/A;  rectal exam under anesthesia, anal dilation, rectal biopsy   [place]   Patient had to guess on the date.   RECTAL BIOPSY  06/21/2012   Procedure: BIOPSY RECTAL;  Surgeon: [name], MD;  Location: [place];  Service: General;  Laterality: N/A;   TEE WITHOUT CARDIOVERSION N/A 12/04/2020   Procedure: TRANSESOPHAGEAL ECHOCARDIOGRAM (TEE);  Surgeon: [name], MD;  Location: [place];  Service: Cardiology;  Laterality: N/A;  TOTAL KNEE ARTHROPLASTY Right 06/09/2021   Procedure: RIGHT TOTAL KNEE ARTHROPLASTY;  Surgeon: Meredith Pel, MD;  Location: [place];  Service: Orthopedics;  Laterality: Right;   TUBAL LIGATION     b/l  age 41   Social History   Occupational History   Not on file  Tobacco Use   Smoking status: Former    Packs/day: 0.50    Years: 25.00    Pack years: 12.50    Types: Cigarettes    Quit date: 09/07/1987    Years since quitting: 33.9   Smokeless tobacco: Never  Vaping Use   Vaping Use: Never used  Substance and Sexual Activity   Alcohol use: No   Drug use: No   Sexual activity: Not Currently

## 2021-08-06 ENCOUNTER — Ambulatory Visit (INDEPENDENT_AMBULATORY_CARE_PROVIDER_SITE_OTHER): Payer: Medicare HMO | Admitting: Rehabilitative and Restorative Service Providers"

## 2021-08-06 ENCOUNTER — Encounter: Payer: Self-pay | Admitting: Rehabilitative and Restorative Service Providers"

## 2021-08-06 DIAGNOSIS — R600 Localized edema: Secondary | ICD-10-CM

## 2021-08-06 DIAGNOSIS — M6281 Muscle weakness (generalized): Secondary | ICD-10-CM | POA: Diagnosis not present

## 2021-08-06 DIAGNOSIS — M25661 Stiffness of right knee, not elsewhere classified: Secondary | ICD-10-CM | POA: Diagnosis not present

## 2021-08-06 DIAGNOSIS — R262 Difficulty in walking, not elsewhere classified: Secondary | ICD-10-CM | POA: Diagnosis not present

## 2021-08-06 DIAGNOSIS — M25561 Pain in right knee: Secondary | ICD-10-CM

## 2021-08-06 DIAGNOSIS — G8929 Other chronic pain: Secondary | ICD-10-CM

## 2021-08-06 NOTE — Therapy (Signed)
[facility] [address] [place], [place], [address] Phone: [phone]   Fax:  [phone]  Physical Therapy Treatment  Patient Details  Name: [name]: [id number] Date of Birth: 12-24-1945 Referring Provider (PT): Tiffany Pel MD   Encounter Date: 08/06/2021   PT End of Session - 08/06/21 0928     Visit Number 9    Number of Visits 24    Date for PT Re-Evaluation 09/30/21    Authorization Type [employer] MCR    Authorization Time Period 9/7-11/7    Authorization - Visit Number 9    Authorization - Number of Visits 12    Progress Note Due on Visit 16    PT Start Time 0845    PT Stop Time 0930    PT Time Calculation (min) 45 min    Activity Tolerance Patient tolerated treatment well;No increased pain    Behavior During Therapy [facility] for tasks assessed/performed             Past Medical History:  Diagnosis Date   anal ca dx'd 11/2011   squamous cell carcinoma   Anxiety    Arthritis 2022   Depression    Full dentures    Hemorrhoids    external   History of radiation therapy 11/29/11 to 01/05/12   anal canal 5040 cGy 28 sessions, regional lymph nodes 4200 cGy 28 sessions   Hyperlipidemia    Rectal bleeding    Vitamin D deficiency    Wears glasses     Past Surgical History:  Procedure Laterality Date   ABDOMINAL HYSTERECTOMY  1978   age 20   BUBBLE STUDY  12/04/2020   Procedure: BUBBLE STUDY;  Surgeon: Tiffany Munroe, MD;  Location: [place];  Service: Cardiology;;   EXAMINATION UNDER ANESTHESIA  06/21/2012   Procedure: EXAM UNDER ANESTHESIA;  Surgeon: Tiffany Hector, MD;  Location: [place];  Service: General;  Laterality: N/A;  rectal exam under anesthesia, anal dilation, rectal biopsy   [place]   Patient had to guess on the date.   RECTAL BIOPSY  06/21/2012   Procedure: BIOPSY RECTAL;  Surgeon: Tiffany Hector, MD;  Location: [place];  Service: General;   Laterality: N/A;   TEE WITHOUT CARDIOVERSION N/A 12/04/2020   Procedure: TRANSESOPHAGEAL ECHOCARDIOGRAM (TEE);  Surgeon: Tiffany Munroe, MD;  Location: [place];  Service: Cardiology;  Laterality: N/A;   TOTAL KNEE ARTHROPLASTY Right 06/09/2021   Procedure: RIGHT TOTAL KNEE ARTHROPLASTY;  Surgeon: Tiffany Pel, MD;  Location: [place];  Service: Orthopedics;  Laterality: Right;   TUBAL LIGATION     b/l  age 66    There were no vitals filed for this visit.   Subjective Assessment - 08/06/21 0851     Subjective [name] continues her excellent HEP compliance.  [name] morning and evening for her back.    Pertinent History Hypothyroid, previous cancer/radiation, lumbar spinal stenosis    Limitations House hold activities;Standing;Walking    How long can you sit comfortably? Start-up stiffness    How long can you stand comfortably? 30 minutes without cane    How long can you walk comfortably? 20 minutes without the cane    Patient Stated Goals Walk better and have less pain    Currently in Pain? No/denies    Pain Score 0-No pain    Pain Location Knee    Pain Orientation Right    Pain Descriptors / Indicators Tightness  Pain Type Chronic pain;Surgical pain    Pain Onset More than a month ago    Pain Frequency Rarely    Aggravating Factors  Too much WB    Pain Relieving Factors Rest and ice    Effect of Pain on Daily Activities Has to pace herself to avoid increased edema    Multiple Pain Sites No                               [facility] Adult PT Treatment/Exercise - 08/06/21 0001       Therapeutic Activites    Therapeutic Activities ADL's    ADL's Step up and over with slow eccentrics and 1 hand rail 6 and 8 inch step      Neuro Re-ed    Neuro Re-ed Details  Dynamic tandem walking (forward and back) and narrow walking drills      Exercises   Exercises Knee/Hip      Knee/Hip Exercises: Aerobic   Recumbent Bike Seat 6 for 8 minutes      Knee/Hip  Exercises: Standing   Other Standing Knee Exercises Alternating hip hike 2 sets of 10 for 3 seconds    Other Standing Knee Exercises Standing hip/trunk extension 10X 3 seconds                     PT Education - 08/06/21 0926     Education Details Progressed more dynamic balance and functional activities (stairs).  Spent time reviewing hip hike and added postural correction with lumbar extension AROM.    Person(s) Educated Patient    Methods Explanation;Demonstration;Tactile cues;Verbal cues;Handout    Comprehension Verbal cues required;Need further instruction;Returned demonstration;Verbalized understanding;Tactile cues required              PT Short Term Goals - 07/29/21 1732       PT SHORT TERM GOAL #1   Title [name] will have R knee AROM at -2 to 100 degrees or better.    Baseline -2 to 115 degrees 07/23/2021    Time 4    Period Weeks    Status Achieved    Target Date 08/05/21      PT SHORT TERM GOAL #2   Title [name] will be independent with her week 1 HEP.    Time 4    Period Weeks    Status Achieved    Target Date 08/05/21               PT Long Term Goals - 08/03/21 0955       PT LONG TERM GOAL #1   Status Achieved      PT LONG TERM GOAL #2   Title [name] will report R knee pain consistently 0-4/10 on the Numeric Pain Rating Scale.    Status Achieved      PT LONG TERM GOAL #3   Status On-going      PT LONG TERM GOAL #4   Title Improve R quadriceps strength to at least 50 pounds.    Status On-going      PT LONG TERM GOAL #5   Title [name] will be independent with her long-term HEP at DC.    Status On-going                   Plan - 08/06/21 0929     Clinical Impression Statement [name] has great AROM for this point post-surgery.  We spent time  working on dynamic balance, stairs and overall R knee function to complement her home balance, AROM and quadriceps strengthening.  Worked on hip abductors and standing hip/trunk  stretching to address impairments noted with gait.  Continue POC with possible RA next Thursday.    Personal Factors and Comorbidities Comorbidity 3+    Comorbidities Hypothyroid, previous cancer/radiation, lumbar spinal stenosis    Examination-Activity Limitations Bathing;Dressing;Bed Mobility;Hygiene/Grooming;Sleep;Squat;Lift;Bend;Locomotion Level;Stairs;Stand    Examination-Participation Restrictions Occupation;Cleaning;Laundry;Community Activity;Driving    Stability/Clinical Decision Making Stable/Uncomplicated    Rehab Potential Good    PT Frequency 3x / week    PT Duration 8 weeks    PT Treatment/Interventions ADLs/Self Care Home Management;Electrical Stimulation;Cryotherapy;Therapeutic activities;Stair training;Gait training;Therapeutic exercise;Neuromuscular re-education;Patient/family education;Manual techniques;Vasopneumatic Device    PT Next Visit Plan Continue with Quadriceps strength, dynamic balance, gait, stairs    PT Home Exercise Plan [phone]    Consulted and Agree with Plan of Care Patient             Patient will benefit from skilled therapeutic intervention in order to improve the following deficits and impairments:  Abnormal gait, Decreased activity tolerance, Decreased endurance, Decreased range of motion, Decreased strength, Difficulty walking, Increased edema, Impaired flexibility, Pain  Visit Diagnosis: Difficulty walking  Muscle weakness (generalized)  Localized edema  Stiffness of right knee, not elsewhere classified  Chronic pain of right knee     Problem List Patient Active Problem List   Diagnosis Date Noted   Arthritis of right knee    UTI (urinary tract infection) 06/26/2021   Severe sepsis ([place]) 29/47/6546   Acute metabolic encephalopathy 50/35/4656   Hypothyroid 06/25/2021   S/P total knee arthroplasty, right 06/09/2021   Fever    Staphylococcus aureus bacteremia with sepsis ([facility])    SIRS (systemic inflammatory response syndrome) ([place])  11/30/2020   Lumbar radiculopathy 10/10/2019   Bilateral stenosis of lateral recess of lumbar spine 10/10/2019   Hemorrhoids, external without complications 81/27/5170   Depression    Anxiety    Rectal bleeding    History of radiation therapy    Malignant neoplasm of anal canal ([place]) 11/15/2011   Hemorrhoids, internal, with bleeding 09/07/2011   Acquired anal stenosis 09/07/2011   Hyperlipidemia 09/07/2011   Family history of breast cancer in sister 09/07/2011    Farley Ly, PT, MPT 08/06/2021, 9:34 AM  [facility] [address] [place], [place], [address] Phone: [phone]   Fax:  [phone]  Name: SHANIKA LEVINGS MRN: [id number] Date of Birth: 1946/07/25

## 2021-08-06 NOTE — Patient Instructions (Addendum)
  Access Code: [email or web address] URL: [email or web address] Date: 08/06/2021 Prepared by: Vista Mink  Exercises Supine Quadricep Sets - 2-3 x daily - 7 x weekly - 2-3 sets - 10 reps - 5 second hold Seated Knee Flexion Extension AROM - 3 x daily - 7 x weekly - 1 sets - 1 reps - 3 minutes hold Tandem Stance - 2 x daily - 7 x weekly - 1 sets - 5 reps - 20 second hold Standing Hip Hiking - 2 x daily - 7 x weekly - 1 sets - 10 reps - 3 seconds hold Standing Lumbar Extension at [place] 5 x daily - 7 x weekly - 1 sets - 5 reps - 3 seconds hold

## 2021-08-10 ENCOUNTER — Ambulatory Visit (INDEPENDENT_AMBULATORY_CARE_PROVIDER_SITE_OTHER): Payer: Medicare HMO | Admitting: Physical Therapy

## 2021-08-10 ENCOUNTER — Other Ambulatory Visit: Payer: Self-pay

## 2021-08-10 ENCOUNTER — Encounter: Payer: Self-pay | Admitting: Physical Therapy

## 2021-08-10 DIAGNOSIS — M25661 Stiffness of right knee, not elsewhere classified: Secondary | ICD-10-CM

## 2021-08-10 DIAGNOSIS — R262 Difficulty in walking, not elsewhere classified: Secondary | ICD-10-CM

## 2021-08-10 DIAGNOSIS — M6281 Muscle weakness (generalized): Secondary | ICD-10-CM | POA: Diagnosis not present

## 2021-08-10 DIAGNOSIS — M25561 Pain in right knee: Secondary | ICD-10-CM

## 2021-08-10 DIAGNOSIS — R600 Localized edema: Secondary | ICD-10-CM | POA: Diagnosis not present

## 2021-08-10 DIAGNOSIS — G8929 Other chronic pain: Secondary | ICD-10-CM

## 2021-08-10 NOTE — Therapy (Signed)
[facility] Physical Therapy [address] [place], [place], [address] Phone: [phone]   Fax:  [phone]  Physical Therapy Treatment  Patient Details  Name: [name]: [id number] Date of Birth: 1945-12-26 Referring Provider (PT): Dr.Gregory Marlou Sa   Encounter Date: 08/10/2021   PT End of Session - 08/10/21 0924     Visit Number 10    Number of Visits 24    Date for PT Re-Evaluation 09/30/21    Authorization Type [employer] MCR    Authorization Time Period 9/7-11/7    Authorization - Visit Number 10    Authorization - Number of Visits 12    Progress Note Due on Visit 16    PT Start Time 0920    PT Stop Time 1000    PT Time Calculation (min) 40 min    Activity Tolerance Patient tolerated treatment well;No increased pain    Behavior During Therapy [facility] for tasks assessed/performed             Past Medical History:  Diagnosis Date   anal ca dx'd 11/2011   squamous cell carcinoma   Anxiety    Arthritis 2022   Depression    Full dentures    Hemorrhoids    external   History of radiation therapy 11/29/11 to 01/05/12   anal canal 5040 cGy 28 sessions, regional lymph nodes 4200 cGy 28 sessions   Hyperlipidemia    Rectal bleeding    Vitamin D deficiency    Wears glasses     Past Surgical History:  Procedure Laterality Date   ABDOMINAL HYSTERECTOMY  1978   age 53   BUBBLE STUDY  12/04/2020   Procedure: BUBBLE STUDY;  Surgeon: [name], MD;  Location: [place];  Service: Cardiology;;   EXAMINATION UNDER ANESTHESIA  06/21/2012   Procedure: EXAM UNDER ANESTHESIA;  Surgeon: [name], MD;  Location: [place];  Service: General;  Laterality: N/A;  rectal exam under anesthesia, anal dilation, rectal biopsy   [place]   Patient had to guess on the date.   RECTAL BIOPSY  06/21/2012   Procedure: BIOPSY RECTAL;  Surgeon: [name], MD;  Location: [place];  Service: General;   Laterality: N/A;   TEE WITHOUT CARDIOVERSION N/A 12/04/2020   Procedure: TRANSESOPHAGEAL ECHOCARDIOGRAM (TEE);  Surgeon: [name], MD;  Location: [place];  Service: Cardiology;  Laterality: N/A;   TOTAL KNEE ARTHROPLASTY Right 06/09/2021   Procedure: RIGHT TOTAL KNEE ARTHROPLASTY;  Surgeon: Meredith Pel, MD;  Location: [place];  Service: Orthopedics;  Laterality: Right;   TUBAL LIGATION     b/l  age 55    There were no vitals filed for this visit.   Subjective Assessment - 08/10/21 0912     Subjective Pt arriving today reporting 6/10 pain across her joint line of right knee.    Pertinent History Hypothyroid, previous cancer/radiation, lumbar spinal stenosis    Limitations House hold activities;Standing;Walking    How long can you sit comfortably? Start-up stiffness    How long can you stand comfortably? 30 minutes without cane    How long can you walk comfortably? 20 minutes without the cane    Patient Stated Goals Walk better and have less pain    Currently in Pain? Yes    Pain Score 6     Pain Location Knee    Pain Orientation Right    Pain Descriptors / Indicators Aching    Pain  Type Chronic pain    Pain Onset More than a month ago                [facility] PT Assessment - 08/10/21 0001       Assessment   Medical Diagnosis Right TKA    Referring Provider (PT) Dr.Gregory Dean    Onset Date/Surgical Date 06/09/21      AROM   AROM Assessment Site Knee    Right Knee Extension -2    Right Knee Flexion 120                           [facility] Adult PT Treatment/Exercise - 08/10/21 0001       Therapeutic Activites    ADL's stairs 6 inch x 1 flight of stairs with hand rail and close supervision.   beginning to work on step over step pattern     Neuro Re-ed    Neuro Re-ed Details  side stepping 20 feet x 4 both directions, airex beam walking with intermittent UE uspport x 6, heel raises x 20 on airex      Exercises   Exercises Knee/Hip       Knee/Hip Exercises: Aerobic   Recumbent Bike Seat 6 for 8 minutes      Knee/Hip Exercises: Machines for Strengthening   Total Gym Leg Press 56# bilateral LE"s 2x10, Rt LE only 25# 2x10      Knee/Hip Exercises: Seated   Sit to Sand with UE support;2 sets;10 reps   using right UE x 10 and then left UE for support x 10                    PT Education - 08/10/21 1005     Education Details Discussed adding an Ibuprofen once a day to help with inflammation and edema around pt's right knee and into lower leg.    Person(s) Educated Patient    Methods Explanation    Comprehension Verbalized understanding              PT Short Term Goals - 08/10/21 0929       PT SHORT TERM GOAL #1   Title Kniyah will have R knee AROM at -2 to 100 degrees or better.    Status Achieved      PT SHORT TERM GOAL #2   Title Eveny will be independent with her week 1 HEP.    Status Achieved               PT Long Term Goals - 08/10/21 0930       PT LONG TERM GOAL #1   Title Improve FOTO to 59.    Status Achieved      PT LONG TERM GOAL #2   Title [place] will report R knee pain consistently 0-4/10 on the Numeric Pain Rating Scale.    Baseline Pt reporting 6/10 pain on 08/10/2021    Status On-going      PT LONG TERM GOAL #3   Title Improve R knee AROM to 0-110 degrees    Status On-going      PT LONG TERM GOAL #4   Title Improve R quadriceps strength to at least 50 pounds.    Status On-going      PT LONG TERM GOAL #5   Title [place] will be independent with her long-term HEP at DC.    Status On-going  Plan - 08/10/21 0925     Clinical Impression Statement Pt arriving today reproting increased pain in her right knee of 6/10. Pt reporting increased walking her dog which may have aggrivated it. Pt reporting compliance in her HEP and is progressing with right knee active ROM of 2-120 degrees. Pt is contiuing to progress with dynamic balance and  overall quad strength and stability. Continue skilled PT to maximize funciton.    Personal Factors and Comorbidities Comorbidity 3+    Comorbidities Hypothyroid, previous cancer/radiation, lumbar spinal stenosis    Examination-Activity Limitations Bathing;Dressing;Bed Mobility;Hygiene/Grooming;Sleep;Squat;Lift;Bend;Locomotion Level;Stairs;Stand    Examination-Participation Restrictions Occupation;Cleaning;Laundry;Community Activity;Driving    Stability/Clinical Decision Making Stable/Uncomplicated    Rehab Potential Good    PT Frequency 3x / week    PT Duration 8 weeks    PT Treatment/Interventions ADLs/Self Care Home Management;Electrical Stimulation;Cryotherapy;Therapeutic activities;Stair training;Gait training;Therapeutic exercise;Neuromuscular re-education;Patient/family education;Manual techniques;Vasopneumatic Device    PT Next Visit Plan Continue with Quadriceps strength, dynamic balance, gait, stairs    PT Home Exercise Plan [phone]    Consulted and Agree with Plan of Care Patient             Patient will benefit from skilled therapeutic intervention in order to improve the following deficits and impairments:  Abnormal gait, Decreased activity tolerance, Decreased endurance, Decreased range of motion, Decreased strength, Difficulty walking, Increased edema, Impaired flexibility, Pain  Visit Diagnosis: Difficulty walking  Muscle weakness (generalized)  Localized edema  Stiffness of right knee, not elsewhere classified  Chronic pain of right knee     Problem List Patient Active Problem List   Diagnosis Date Noted   Arthritis of right knee    UTI (urinary tract infection) 06/26/2021   Severe sepsis ([place]) 25/00/3704   Acute metabolic encephalopathy 88/89/1694   Hypothyroid 06/25/2021   S/P total knee arthroplasty, right 06/09/2021   Fever    Staphylococcus aureus bacteremia with sepsis (HCC)    SIRS (systemic inflammatory response syndrome) ([place]) 11/30/2020    Lumbar radiculopathy 10/10/2019   Bilateral stenosis of lateral recess of lumbar spine 10/10/2019   Hemorrhoids, external without complications 50/38/8828   Depression    Anxiety    Rectal bleeding    History of radiation therapy    Malignant neoplasm of anal canal ([place]) 11/15/2011   Hemorrhoids, internal, with bleeding 09/07/2011   Acquired anal stenosis 09/07/2011   Hyperlipidemia 09/07/2011   Family history of breast cancer in sister 09/07/2011    Oretha Caprice, PT, MPT 08/10/2021, 10:07 AM  [facility] Physical Therapy [address] [place], [place], [address] Phone: [phone]   Fax:  [phone]  Name: [name]: [id number] Date of Birth: Mar 27, 1946

## 2021-08-12 ENCOUNTER — Other Ambulatory Visit: Payer: Medicare HMO

## 2021-08-13 ENCOUNTER — Encounter: Payer: Self-pay | Admitting: Rehabilitative and Restorative Service Providers"

## 2021-08-13 ENCOUNTER — Other Ambulatory Visit: Payer: Self-pay

## 2021-08-13 ENCOUNTER — Ambulatory Visit (INDEPENDENT_AMBULATORY_CARE_PROVIDER_SITE_OTHER): Payer: Medicare HMO | Admitting: Rehabilitative and Restorative Service Providers"

## 2021-08-13 DIAGNOSIS — M25561 Pain in right knee: Secondary | ICD-10-CM

## 2021-08-13 DIAGNOSIS — R600 Localized edema: Secondary | ICD-10-CM | POA: Diagnosis not present

## 2021-08-13 DIAGNOSIS — R262 Difficulty in walking, not elsewhere classified: Secondary | ICD-10-CM

## 2021-08-13 DIAGNOSIS — M25661 Stiffness of right knee, not elsewhere classified: Secondary | ICD-10-CM | POA: Diagnosis not present

## 2021-08-13 DIAGNOSIS — G8929 Other chronic pain: Secondary | ICD-10-CM

## 2021-08-13 DIAGNOSIS — M6281 Muscle weakness (generalized): Secondary | ICD-10-CM | POA: Diagnosis not present

## 2021-08-13 NOTE — Therapy (Signed)
[facility] Physical Therapy [address] [place], [place], [address] Phone: [phone]   Fax:  [phone]  Physical Therapy Treatment/Re-certification  Patient Details  Name: [name]: [id number] Date of Birth: [birth year] Referring Provider (PT): Dr.Gregory Marlou Sa  Progress Note Reporting Period 07/08/2021 to 08/13/2021  See note below for Objective Data and Assessment of Progress/Goals.   Referring diagnosis? X52.841 Treatment diagnosis? (if different than referring diagnosis) R26.2  M62.81  R60.0  L24.401  U27.253 What was this (referring dx) caused by? [x]  Surgery []  Fall [x]  Ongoing issue [x]  Arthritis []  Other: ____________  Laterality: [x]  Rt []  Lt []  Both  Check all possible CPT codes:      [x]  97110 (Therapeutic Exercise)  []  92507 (SLP Treatment)  []  97112 (Neuro Re-ed)   []  92526 (Swallowing Treatment)   [x]  97116 (Gait Training)   []  [id number] (Cognitive Training, 1st 15 minutes) [x]  97140 (Manual Therapy)   []  97130 (Cognitive Training, each add'l 15 minutes)  [x]  97530 (Therapeutic Activities)  []  Other, List CPT Code ____________    [x]  97535 (Self Care)       [x]  All codes above (97110 - 97535)  []  97012 (Mechanical Traction)  []  97014 (E-stim Unattended)  []  97032 (E-stim manual)  []  97033 (Ionto)  []  97035 (Ultrasound)  []  97760 (Orthotic Fit) [x]  97750 (Physical Performance Training) []  [id number] (Aquatic Therapy) []  97034 (Contrast Bath) []  [id number] (Paraffin) []  97597 (Wound Care 1st 20 sq cm) []  97598 (Wound Care each add'l 20 sq cm) [x]  97016 (Vasopneumatic Device) []  [id number] (Orthotic Training) []  [id number] (Prosthetic Training)  Encounter Date: 08/13/2021   PT End of Session - 08/13/21 1044     Visit Number 11    Number of Visits 24    Date for PT Re-Evaluation 09/30/21    Authorization Type [employer] MCR    Authorization Time Period 9/7-11/7    Authorization - Visit Number 11    Authorization - Number of Visits 12     Progress Note Due on Visit 23    PT Start Time 0930    PT Stop Time 1040    PT Time Calculation (min) 70 min    Activity Tolerance Patient tolerated treatment well;No increased pain    Behavior During Therapy [facility] for tasks assessed/performed             Past Medical History:  Diagnosis Date   anal ca dx'd 11/2011   squamous cell carcinoma   Anxiety    Arthritis 2022   Depression    Full dentures    Hemorrhoids    external   History of radiation therapy 11/29/11 to 01/05/12   anal canal 5040 cGy 28 sessions, regional lymph nodes 4200 cGy 28 sessions   Hyperlipidemia    Rectal bleeding    Vitamin D deficiency    Wears glasses     Past Surgical History:  Procedure Laterality Date   ABDOMINAL HYSTERECTOMY  1978   age 61   BUBBLE STUDY  12/04/2020   Procedure: BUBBLE STUDY;  Surgeon: [name], MD;  Location: [place];  Service: Cardiology;;   EXAMINATION UNDER ANESTHESIA  06/21/2012   Procedure: EXAM UNDER ANESTHESIA;  Surgeon: [name], MD;  Location: [place];  Service: General;  Laterality: N/A;  rectal exam under anesthesia, anal dilation, rectal biopsy   [place]   Patient had to guess on the date.   RECTAL BIOPSY  06/21/2012   Procedure: BIOPSY RECTAL;  Surgeon: [name], MD;  Location: [place];  Service: General;  Laterality: N/A;   TEE WITHOUT CARDIOVERSION N/A 12/04/2020   Procedure: TRANSESOPHAGEAL ECHOCARDIOGRAM (TEE);  Surgeon: [name], MD;  Location: [place];  Service: Cardiology;  Laterality: N/A;   TOTAL KNEE ARTHROPLASTY Right 06/09/2021   Procedure: RIGHT TOTAL KNEE ARTHROPLASTY;  Surgeon: Meredith Pel, MD;  Location: [place];  Service: Orthopedics;  Laterality: Right;   TUBAL LIGATION     b/l  age 36    There were no vitals filed for this visit.   Subjective Assessment - 08/13/21 0935     Subjective [name] is taking tylenol arthritis 2X/day.  [name] does report  patello-femoral pain likely due to increased activities and quadriceps weakness.    Pertinent History Hypothyroid, previous cancer/radiation, lumbar spinal stenosis    Limitations House hold activities;Standing;Walking    How long can you sit comfortably? Start-up stiffness    How long can you stand comfortably? 60 minutes (was 30 minutes) without cane    How long can you walk comfortably? 20-25 (was 20 minutes) without the cane    Patient Stated Goals Walk better and have less pain    Currently in Pain? Yes    Pain Score 6     Pain Location Knee    Pain Orientation Right    Pain Descriptors / Indicators Aching    Pain Type Chronic pain    Pain Radiating Towards NA    Pain Onset More than a month ago    Pain Frequency Intermittent    Aggravating Factors  Doing too much    Pain Relieving Factors Rest and ice, tylenol arthritis PRN    Effect of Pain on Daily Activities More pain with increased activities or has to slow down to avoid increasing pain    Multiple Pain Sites No                [facility] PT Assessment - 08/13/21 0001       ROM / Strength   AROM / PROM / Strength AROM;Strength      AROM   Overall AROM  Deficits    AROM Assessment Site Knee    Right Knee Extension 0    Right Knee Flexion 120      Strength   Overall Strength Deficits    Strength Assessment Site Knee    Right/Left Knee Left;Right    Right Knee Extension --   41.2 pounds   Left Knee Extension --   35.3 pounds                          [facility] Adult PT Treatment/Exercise - 08/13/21 0001       Therapeutic Activites    Therapeutic Activities ADL's    ADL's Step-up and over no hands 4 and 6 inch step.  Step up and over 1 handrail and slow eccentrics 8 inch step      Neuro Re-ed    Neuro Re-ed Details  Single leg stance and slow marching      Exercises   Exercises Knee/Hip      Knee/Hip Exercises: Aerobic   Recumbent Bike Seat 6 for 8 minutes      Knee/Hip Exercises: Machines  for Strengthening   Cybex Knee Extension 15# B slow eccentrics 10X; 10X SL each leg up with B down with 1 leg (L and R) 10#      Knee/Hip Exercises: Standing  Other Standing Knee Exercises Alternating hip hike 2 sets of 10 for 3 seconds    Other Standing Knee Exercises Standing hip/trunk extension 10X 3 seconds      Vasopneumatic   Number Minutes Vasopneumatic  10 minutes    Vasopnuematic Location  Knee    Vasopneumatic Pressure Medium    Vasopneumatic Temperature  34*                     PT Education - 08/13/21 1034     Education Details Reviewed HEP, RA findings, progressed balance and functional activities.    Person(s) Educated Patient    Methods Explanation;Demonstration;Tactile cues;Verbal cues;Handout    Comprehension Verbalized understanding;Tactile cues required;Need further instruction;Returned demonstration;Verbal cues required              PT Short Term Goals - 08/10/21 0929       PT SHORT TERM GOAL #1   Title [place] will have R knee AROM at -2 to 100 degrees or better.    Status Achieved      PT SHORT TERM GOAL #2   Title [name] will be independent with her week 1 HEP.    Status Achieved               PT Long Term Goals - 08/13/21 1035       PT LONG TERM GOAL #1   Title Improve FOTO to 59.    Baseline 62 (was 33)    Status Achieved      PT LONG TERM GOAL #2   Title [name] will report R knee pain consistently 0-4/10 on the Numeric Pain Rating Scale.    Baseline [name] has noted increased knee pain with an increase in activity.  This is consistent with her objective quadriceps strength.    Time 7    Period Weeks    Status On-going    Target Date 10/01/21      PT LONG TERM GOAL #3   Title Improve R knee AROM to 0-110 degrees    Baseline 0-120    Status Achieved      PT LONG TERM GOAL #4   Title Improve R quadriceps strength to at least 50 pounds.    Baseline L is 35.3 pounds and R 41.2 pounds (Goal is 50+ pounds B)    Time 7     Period Weeks    Status On-going    Target Date 10/01/21      PT LONG TERM GOAL #5   Title [name] will be independent with her long-term HEP at DC.    Time 7    Period Weeks    Status On-going    Target Date 10/01/21                   Plan - 08/13/21 1045     Clinical Impression Statement [name] is making good progress with her post-TKA physical therapy.  AROM is outstanding at 0-120 degrees.  Strength is still low at ~40 pounds (50+ Goal).  This makes stairs difficult and [name] has noticed increased knee pain as [name] has increased her standing and walking time due to quadriceps weakness.  [name] will benefit from the recommended course of PT to improve balance, gait quality, endurance, strength and function before transfer into independent rehabilitation.    Personal Factors and Comorbidities Comorbidity 3+    Comorbidities Hypothyroid, previous cancer/radiation, lumbar spinal stenosis    Examination-Activity Limitations Bathing;Dressing;Bed Mobility;Hygiene/Grooming;Sleep;Squat;Lift;Bend;Locomotion Level;Stairs;Stand    Examination-Participation  Restrictions Occupation;Cleaning;Laundry;Community Activity;Driving    Stability/Clinical Decision Making Stable/Uncomplicated    Rehab Potential Good    PT Frequency 3x / week    PT Duration Other (comment)   7 weeks   PT Treatment/Interventions ADLs/Self Care Home Management;Electrical Stimulation;Cryotherapy;Therapeutic activities;Stair training;Gait training;Therapeutic exercise;Neuromuscular re-education;Patient/family education;Manual techniques;Vasopneumatic Device    PT Next Visit Plan Continue with Quadriceps strength, dynamic balance, gait, stairs    PT Home Exercise Plan [phone]    Consulted and Agree with Plan of Care Patient             Patient will benefit from skilled therapeutic intervention in order to improve the following deficits and impairments:  Abnormal gait, Decreased activity tolerance, Decreased  endurance, Decreased range of motion, Decreased strength, Difficulty walking, Increased edema, Impaired flexibility, Pain  Visit Diagnosis: Difficulty walking  Muscle weakness (generalized)  Localized edema  Stiffness of right knee, not elsewhere classified  Chronic pain of right knee     Problem List Patient Active Problem List   Diagnosis Date Noted   Arthritis of right knee    UTI (urinary tract infection) 06/26/2021   Severe sepsis ([place]) 14/97/0263   Acute metabolic encephalopathy 78/58/8502   Hypothyroid 06/25/2021   S/P total knee arthroplasty, right 06/09/2021   Fever    Staphylococcus aureus bacteremia with sepsis (HCC)    SIRS (systemic inflammatory response syndrome) ([place]) 11/30/2020   Lumbar radiculopathy 10/10/2019   Bilateral stenosis of lateral recess of lumbar spine 10/10/2019   Hemorrhoids, external without complications 77/41/2878   Depression    Anxiety    Rectal bleeding    History of radiation therapy    Malignant neoplasm of anal canal ([place]) 11/15/2011   Hemorrhoids, internal, with bleeding 09/07/2011   Acquired anal stenosis 09/07/2011   Hyperlipidemia 09/07/2011   Family history of breast cancer in sister 09/07/2011    Farley Ly, PT, MPT 08/13/2021, 10:55 AM  [facility] [address] [place], [place], [address] Phone: [phone]   Fax:  [phone]  Name: JONIA OAKEY MRN: [id number] Date of Birth: 1946-01-11

## 2021-08-13 NOTE — Patient Instructions (Signed)
Access Code: [email or web address] URL: [email or web address] Date: 08/13/2021 Prepared by: Vista Mink  Exercises Supine Quadricep Sets - 2-3 x daily - 7 x weekly - 2-3 sets - 10 reps - 5 second hold Tandem Stance - 2 x daily - 7 x weekly - 1 sets - 5 reps - 20 second hold Standing Hip Hiking - 2 x daily - 7 x weekly - 1 sets - 10 reps - 3 seconds hold Standing Lumbar Extension at [place] 5 x daily - 7 x weekly - 1 sets - 5 reps - 3 seconds hold

## 2021-08-18 ENCOUNTER — Other Ambulatory Visit: Payer: Self-pay

## 2021-08-18 ENCOUNTER — Telehealth: Payer: Self-pay | Admitting: Physical Medicine and Rehabilitation

## 2021-08-18 ENCOUNTER — Ambulatory Visit (INDEPENDENT_AMBULATORY_CARE_PROVIDER_SITE_OTHER): Payer: Medicare HMO | Admitting: Physical Therapy

## 2021-08-18 DIAGNOSIS — R262 Difficulty in walking, not elsewhere classified: Secondary | ICD-10-CM

## 2021-08-18 DIAGNOSIS — M25661 Stiffness of right knee, not elsewhere classified: Secondary | ICD-10-CM | POA: Diagnosis not present

## 2021-08-18 DIAGNOSIS — R600 Localized edema: Secondary | ICD-10-CM | POA: Diagnosis not present

## 2021-08-18 DIAGNOSIS — G8929 Other chronic pain: Secondary | ICD-10-CM

## 2021-08-18 DIAGNOSIS — M25561 Pain in right knee: Secondary | ICD-10-CM

## 2021-08-18 DIAGNOSIS — M6281 Muscle weakness (generalized): Secondary | ICD-10-CM | POA: Diagnosis not present

## 2021-08-18 NOTE — Therapy (Signed)
[facility] Physical Therapy [address] [place], [place], [address] Phone: [phone]   Fax:  [phone]  Physical Therapy Treatment  Patient Details  Name: [name]: [id number] Date of Birth: 02/10/1946 Referring Provider (PT): Marcene Duos MD   Encounter Date: 08/18/2021   PT End of Session - 08/18/21 1605     Visit Number 12    Number of Visits 24    Date for PT Re-Evaluation 09/30/21    Authorization Type [facility], an additional 12 visist applied for on 08/18/2021    Authorization Time Period 9/7-11/7    Authorization - Visit Number 12    Authorization - Number of Visits 12    Progress Note Due on Visit 23    PT Start Time 0626    PT Stop Time 1555    PT Time Calculation (min) 40 min    Activity Tolerance Patient tolerated treatment well;No increased pain    Behavior During Therapy [facility] for tasks assessed/performed             Past Medical History:  Diagnosis Date   anal ca dx'd 11/2011   squamous cell carcinoma   Anxiety    Arthritis 2022   Depression    Full dentures    Hemorrhoids    external   History of radiation therapy 11/29/11 to 01/05/12   anal canal 5040 cGy 28 sessions, regional lymph nodes 4200 cGy 28 sessions   Hyperlipidemia    Rectal bleeding    Vitamin D deficiency    Wears glasses     Past Surgical History:  Procedure Laterality Date   ABDOMINAL HYSTERECTOMY  1978   age 89   BUBBLE STUDY  12/04/2020   Procedure: BUBBLE STUDY;  Surgeon: [name], MD;  Location: [place];  Service: Cardiology;;   EXAMINATION UNDER ANESTHESIA  06/21/2012   Procedure: EXAM UNDER ANESTHESIA;  Surgeon: [name], MD;  Location: [place];  Service: General;  Laterality: N/A;  rectal exam under anesthesia, anal dilation, rectal biopsy   [place]   Patient had to guess on the date.   RECTAL BIOPSY  06/21/2012   Procedure: BIOPSY RECTAL;  Surgeon: [name], MD;   Location: [place];  Service: General;  Laterality: N/A;   TEE WITHOUT CARDIOVERSION N/A 12/04/2020   Procedure: TRANSESOPHAGEAL ECHOCARDIOGRAM (TEE);  Surgeon: [name], MD;  Location: [place];  Service: Cardiology;  Laterality: N/A;   TOTAL KNEE ARTHROPLASTY Right 06/09/2021   Procedure: RIGHT TOTAL KNEE ARTHROPLASTY;  Surgeon: Meredith Pel, MD;  Location: [place];  Service: Orthopedics;  Laterality: Right;   TUBAL LIGATION     b/l  age 69    There were no vitals filed for this visit.   Subjective Assessment - 08/18/21 1604     Subjective Pt arriving today reporting more soreness and stiffness in her right knee.    Pertinent History Hypothyroid, previous cancer/radiation, lumbar spinal stenosis    Limitations House hold activities;Standing;Walking    How long can you sit comfortably? Start-up stiffness    How long can you stand comfortably? 60 minutes (was 30 minutes) without cane    Patient Stated Goals Walk better and have less pain    Currently in Pain? Yes    Pain Score 5     Pain Orientation Right    Pain Descriptors / Indicators Sore;Tightness    Pain Type Chronic pain    Pain Onset More  than a month ago                [facility] PT Assessment - 08/18/21 0001       Assessment   Medical Diagnosis right TKA    Referring Provider (PT) Marcene Duos MD      AROM   Right Knee Extension 0    Right Knee Flexion 120                           [facility] Adult PT Treatment/Exercise - 08/18/21 0001       Therapeutic Activites    ADL's sit to stand x 15 using single UE support, step on off curb step x 10      Neuro Re-ed    Neuro Re-ed Details  SLS on Airex with intermittent UE support, tandum walking 10 feet x 4, stepping over horizontal cones x 6 (4 cones)      Exercises   Exercises Knee/Hip      Knee/Hip Exercises: Aerobic   Recumbent Bike Seat 6 for 8 minutes      Knee/Hip Exercises: Machines for Strengthening   Total  Gym Leg Press 62# bilateral LE's 3 x10, Rt LE: 37# 2x10                       PT Short Term Goals - 08/10/21 0929       PT SHORT TERM GOAL #1   Title [name] will have R knee AROM at -2 to 100 degrees or better.    Status Achieved      PT SHORT TERM GOAL #2   Title [name] will be independent with her week 1 HEP.    Status Achieved               PT Long Term Goals - 08/13/21 1035       PT LONG TERM GOAL #1   Title Improve FOTO to 59.    Baseline 62 (was 33)    Status Achieved      PT LONG TERM GOAL #2   Title [name] will report R knee pain consistently 0-4/10 on the Numeric Pain Rating Scale.    Baseline [name] has noted increased knee pain with an increase in activity.  This is consistent with her objective quadriceps strength.    Time 7    Period Weeks    Status On-going    Target Date 10/01/21      PT LONG TERM GOAL #3   Title Improve R knee AROM to 0-110 degrees    Baseline 0-120    Status Achieved      PT LONG TERM GOAL #4   Title Improve R quadriceps strength to at least 50 pounds.    Baseline L is 35.3 pounds and R 41.2 pounds (Goal is 50+ pounds B)    Time 7    Period Weeks    Status On-going    Target Date 10/01/21      PT LONG TERM GOAL #5   Title [name] will be independent with her long-term HEP at DC.    Time 7    Period Weeks    Status On-going    Target Date 10/01/21                   Plan - 08/18/21 1607     Clinical Impression Statement Pt has made excellent progress with AROM with rigth knee arc  of motion 0-120 degrees. Pt is still progressing with overall quad strength and improved functional mobility. Pt still requiring UE support and intermittent CGA for dyanmic balance activities. I am requesting 12 additional visits to progress toward pt's original POC and LTG's not met.    Personal Factors and Comorbidities Comorbidity 3+    Comorbidities Hypothyroid, previous cancer/radiation, lumbar spinal stenosis     Examination-Activity Limitations Bathing;Dressing;Bed Mobility;Hygiene/Grooming;Sleep;Squat;Lift;Bend;Locomotion Level;Stairs;Stand    Examination-Participation Restrictions Occupation;Cleaning;Laundry;Community Activity;Driving    Stability/Clinical Decision Making Stable/Uncomplicated    Rehab Potential Good    PT Frequency 2x / week    PT Duration Other (comment)    PT Treatment/Interventions ADLs/Self Care Home Management;Electrical Stimulation;Cryotherapy;Therapeutic activities;Stair training;Gait training;Therapeutic exercise;Neuromuscular re-education;Patient/family education;Manual techniques;Vasopneumatic Device    PT Next Visit Plan Continue with Quadriceps strength, dynamic balance, gait, stairs    PT Home Exercise Plan [phone]    Consulted and Agree with Plan of Care Patient             Patient will benefit from skilled therapeutic intervention in order to improve the following deficits and impairments:  Abnormal gait, Decreased activity tolerance, Decreased endurance, Decreased range of motion, Decreased strength, Difficulty walking, Increased edema, Impaired flexibility, Pain  Visit Diagnosis: Difficulty walking  Muscle weakness (generalized)  Localized edema  Stiffness of right knee, not elsewhere classified  Chronic pain of right knee     Problem List Patient Active Problem List   Diagnosis Date Noted   Arthritis of right knee    UTI (urinary tract infection) 06/26/2021   Severe sepsis ([place]) 12/45/8099   Acute metabolic encephalopathy 83/38/2505   Hypothyroid 06/25/2021   S/P total knee arthroplasty, right 06/09/2021   Fever    Staphylococcus aureus bacteremia with sepsis (HCC)    SIRS (systemic inflammatory response syndrome) ([place]) 11/30/2020   Lumbar radiculopathy 10/10/2019   Bilateral stenosis of lateral recess of lumbar spine 10/10/2019   Hemorrhoids, external without complications 39/76/7341   Depression    Anxiety    Rectal bleeding     History of radiation therapy    Malignant neoplasm of anal canal ([place]) 11/15/2011   Hemorrhoids, internal, with bleeding 09/07/2011   Acquired anal stenosis 09/07/2011   Hyperlipidemia 09/07/2011   Family history of breast cancer in sister 09/07/2011   Referring diagnosis? P37.902 Treatment diagnosis? (if different than referring diagnosis) R26.2, M62.81, R60.0, M25.661, M25.561 What was this (referring dx) caused by? _0  Surgery _1  Fall _2  Ongoing issue _3  Arthritis _4  Other: ____________  Laterality: _5  Rt _6  Lt _7  Both  Check all possible CPT codes:      _8  97110 (Therapeutic Exercise)  _9  92507 (SLP Treatment)  _10  40973 (Neuro Re-ed)   _11  53299 (Swallowing Treatment)   _12  24268 (Gait Training)   _13  34196 (Cognitive Training, 1st 15 minutes) _14  97140 (Manual Therapy)   _15  97130 (Cognitive Training, each add'l 15 minutes)  _16  97530 (Therapeutic Activities)  _17  Other, List CPT Code ____________    _18  22297 (Self Care)       _19  All codes above (97110 - 97535)  _20  97012 (Mechanical Traction)  _21  97014 (E-stim Unattended)  _22  97032 (E-stim manual)  _23  97033 (Ionto)  _24  97035 (Ultrasound)  _25  97760 (Orthotic Fit) _26  97750 (Physical Performance Training) _27  [id number] (Aquatic Therapy) _28  98921 (Contrast Bath) _29  19417 (Paraffin) _30  97597 (Wound Care 1st 20 sq cm) _31  97598 (Wound Care each add'l 20 sq cm) _32  97016 (Vasopneumatic Device) _33  40814 (Orthotic Training) _34  48185 (Prosthetic Training)  Anderson Malta  Cherylynn Ridges, PT, MPT 08/18/2021, 4:11 PM  [facility] Physical Therapy [address] [place], [place], [address] Phone: [phone]   Fax:  [phone]  Name: AYLINN RYDBERG MRN: [id number] Date of Birth: 1945-11-25

## 2021-08-18 NOTE — Telephone Encounter (Signed)
Pt called and asking about making an appt with Dr.newton. Please call her before 3:00 today because she has an appt then.   CB [phone]

## 2021-08-21 ENCOUNTER — Other Ambulatory Visit: Payer: Self-pay

## 2021-08-21 ENCOUNTER — Encounter: Payer: Self-pay | Admitting: Rehabilitative and Restorative Service Providers"

## 2021-08-21 ENCOUNTER — Ambulatory Visit (INDEPENDENT_AMBULATORY_CARE_PROVIDER_SITE_OTHER): Payer: Medicare HMO | Admitting: Rehabilitative and Restorative Service Providers"

## 2021-08-21 DIAGNOSIS — R600 Localized edema: Secondary | ICD-10-CM

## 2021-08-21 DIAGNOSIS — R262 Difficulty in walking, not elsewhere classified: Secondary | ICD-10-CM | POA: Diagnosis not present

## 2021-08-21 DIAGNOSIS — M6281 Muscle weakness (generalized): Secondary | ICD-10-CM

## 2021-08-21 DIAGNOSIS — G8929 Other chronic pain: Secondary | ICD-10-CM

## 2021-08-21 DIAGNOSIS — M25661 Stiffness of right knee, not elsewhere classified: Secondary | ICD-10-CM | POA: Diagnosis not present

## 2021-08-21 DIAGNOSIS — M25561 Pain in right knee: Secondary | ICD-10-CM

## 2021-08-21 NOTE — Therapy (Signed)
[facility] Physical Therapy [address] [place], [place], [address] Phone: [phone]   Fax:  [phone]  Physical Therapy Treatment  Patient Details  Name: [name]: [id number] Date of Birth: April 13, 1946 Referring Provider (PT): Marcene Duos MD   Encounter Date: 08/21/2021   PT End of Session - 08/21/21 1636     Visit Number 13    Number of Visits 24    Date for PT Re-Evaluation 09/30/21    Authorization Type [facility], an additional 12 visist applied for on 08/18/2021    Authorization Time Period 9/7-11/7    Authorization - Visit Number 24    Authorization - Number of Visits 24    Progress Note Due on Visit 24    PT Start Time 1346    PT Stop Time 1440    PT Time Calculation (min) 54 min    Activity Tolerance Patient tolerated treatment well;No increased pain    Behavior During Therapy [facility] for tasks assessed/performed             Past Medical History:  Diagnosis Date   anal ca dx'd 11/2011   squamous cell carcinoma   Anxiety    Arthritis 2022   Depression    Full dentures    Hemorrhoids    external   History of radiation therapy 11/29/11 to 01/05/12   anal canal 5040 cGy 28 sessions, regional lymph nodes 4200 cGy 28 sessions   Hyperlipidemia    Rectal bleeding    Vitamin D deficiency    Wears glasses     Past Surgical History:  Procedure Laterality Date   ABDOMINAL HYSTERECTOMY  1978   age 39   BUBBLE STUDY  12/04/2020   Procedure: BUBBLE STUDY;  Surgeon: [name], MD;  Location: [place];  Service: Cardiology;;   EXAMINATION UNDER ANESTHESIA  06/21/2012   Procedure: EXAM UNDER ANESTHESIA;  Surgeon: [name], MD;  Location: [place];  Service: General;  Laterality: N/A;  rectal exam under anesthesia, anal dilation, rectal biopsy   [place]   Patient had to guess on the date.   RECTAL BIOPSY  06/21/2012   Procedure: BIOPSY RECTAL;  Surgeon: [name], MD;   Location: [place];  Service: General;  Laterality: N/A;   TEE WITHOUT CARDIOVERSION N/A 12/04/2020   Procedure: TRANSESOPHAGEAL ECHOCARDIOGRAM (TEE);  Surgeon: [name], MD;  Location: [place];  Service: Cardiology;  Laterality: N/A;   TOTAL KNEE ARTHROPLASTY Right 06/09/2021   Procedure: RIGHT TOTAL KNEE ARTHROPLASTY;  Surgeon: Meredith Pel, MD;  Location: [place];  Service: Orthopedics;  Laterality: Right;   TUBAL LIGATION     b/l  age 70    There were no vitals filed for this visit.   Subjective Assessment - 08/21/21 1416     Subjective [name] reports better hip strength and swelling over the past week.  Hip and knee pain/soreness with fatigue.    Pertinent History Hypothyroid, previous cancer/radiation, lumbar spinal stenosis    Limitations House hold activities;Standing;Walking    How long can you sit comfortably? Start-up stiffness    How long can you stand comfortably? 90 minutes (was 30 minutes) without cane    How long can you walk comfortably? 45 minutes without the cane    Patient Stated Goals Walk better and have less pain    Pain Score 4     Pain Location Knee    Pain Orientation Right  Pain Descriptors / Indicators Sore;Aching    Pain Type Acute pain    Pain Radiating Towards NA    Pain Onset More than a month ago    Pain Frequency Rarely    Aggravating Factors  Overuse    Pain Relieving Factors Rest, ice, tylenol, exercises    Effect of Pain on Daily Activities Has to pace herself as overuse leads to increased pain    Multiple Pain Sites No                               [facility] Adult PT Treatment/Exercise - 08/21/21 0001       Therapeutic Activites    Therapeutic Activities ADL's    ADL's Step-up and over and stairs slow eccentrics (4 & 6 inches)      Neuro Re-ed    Neuro Re-ed Details  Feet together eyes closed, tandem balance eyes open and closed, dynamic heel to toe raises and slow marching (single leg  stance)      Exercises   Exercises Knee/Hip      Knee/Hip Exercises: Aerobic   Recumbent Bike --      Knee/Hip Exercises: Machines for Strengthening   Total Gym Leg Press 75# B slow eccentrics and 50# single leg 10X slow eccentrics      Knee/Hip Exercises: Standing   Other Standing Knee Exercises Alternating hip hike 2 sets of 10 for 3 seconds    Other Standing Knee Exercises Standing hip/trunk extension 10X 3 seconds      Knee/Hip Exercises: Supine   Quad Sets Strengthening;Both;2 sets;10 reps;Limitations    Quad Sets Limitations toes back, press down and tighten thighs      Vasopneumatic   Number Minutes Vasopneumatic  10 minutes    Vasopnuematic Location  Knee    Vasopneumatic Pressure Medium    Vasopneumatic Temperature  34*                     PT Education - 08/21/21 1635     Education Details Worked on balance and functional activities, quadriceps and hip abductors strength.  Focused HEP on the above listed activities.    Person(s) Educated Patient    Methods Explanation;Handout;Demonstration;Tactile cues;Verbal cues    Comprehension Verbal cues required;Returned demonstration;Need further instruction;Tactile cues required;Verbalized understanding              PT Short Term Goals - 08/10/21 0929       PT SHORT TERM GOAL #1   Title [name] will have R knee AROM at -2 to 100 degrees or better.    Status Achieved      PT SHORT TERM GOAL #2   Title [name] will be independent with her week 1 HEP.    Status Achieved               PT Long Term Goals - 08/13/21 1035       PT LONG TERM GOAL #1   Title Improve FOTO to 59.    Baseline 62 (was 33)    Status Achieved      PT LONG TERM GOAL #2   Title [name] will report R knee pain consistently 0-4/10 on the Numeric Pain Rating Scale.    Baseline [name] has noted increased knee pain with an increase in activity.  This is consistent with her objective quadriceps strength.    Time 7    Period  Weeks  Status On-going    Target Date 10/01/21      PT LONG TERM GOAL #3   Title Improve R knee AROM to 0-110 degrees    Baseline 0-120    Status Achieved      PT LONG TERM GOAL #4   Title Improve R quadriceps strength to at least 50 pounds.    Baseline L is 35.3 pounds and R 41.2 pounds (Goal is 50+ pounds B)    Time 7    Period Weeks    Status On-going    Target Date 10/01/21      PT LONG TERM GOAL #5   Title [name] will be independent with her long-term HEP at [place].    Time 7    Period Weeks    Status On-going    Target Date 10/01/21                   Plan - 08/21/21 1637     Clinical Impression Statement [name]'s knee was a bit swollen today and she mentions she has been doing more work and activities around her home.  Overall progress is good although she will continue to benefit from balance, stairs, functional and quadriceps/hip abductors strength work in preparation for transfer into independent rehabilitation.    Personal Factors and Comorbidities Comorbidity 3+    Comorbidities Hypothyroid, previous cancer/radiation, lumbar spinal stenosis    Examination-Activity Limitations Bathing;Dressing;Bed Mobility;Hygiene/Grooming;Sleep;Squat;Lift;Bend;Locomotion Level;Stairs;Stand    Examination-Participation Restrictions Occupation;Cleaning;Laundry;Community Activity;Driving    Stability/Clinical Decision Making Stable/Uncomplicated    Rehab Potential Good    PT Frequency 2x / week    PT Duration Other (comment)    PT Treatment/Interventions ADLs/Self Care Home Management;Electrical Stimulation;Cryotherapy;Therapeutic activities;Stair training;Gait training;Therapeutic exercise;Neuromuscular re-education;Patient/family education;Manual techniques;Vasopneumatic Device    PT Next Visit Plan Balance, stairs, functional and quadriceps/hip abductors strength    PT Home Exercise Plan [phone]    Consulted and Agree with Plan of Care Patient             Patient  will benefit from skilled therapeutic intervention in order to improve the following deficits and impairments:  Abnormal gait, Decreased activity tolerance, Decreased endurance, Decreased range of motion, Decreased strength, Difficulty walking, Increased edema, Impaired flexibility, Pain  Visit Diagnosis: Difficulty walking  Muscle weakness (generalized)  Localized edema  Stiffness of right knee, not elsewhere classified  Chronic pain of right knee     Problem List Patient Active Problem List   Diagnosis Date Noted   Arthritis of right knee    UTI (urinary tract infection) 06/26/2021   Severe sepsis ([place]) 71/04/2693   Acute metabolic encephalopathy 85/46/2703   Hypothyroid 06/25/2021   S/P total knee arthroplasty, right 06/09/2021   Fever    Staphylococcus aureus bacteremia with sepsis (HCC)    SIRS (systemic inflammatory response syndrome) ([place]) 11/30/2020   Lumbar radiculopathy 10/10/2019   Bilateral stenosis of lateral recess of lumbar spine 10/10/2019   Hemorrhoids, external without complications 50/07/3817   Depression    Anxiety    Rectal bleeding    History of radiation therapy    Malignant neoplasm of anal canal ([place]) 11/15/2011   Hemorrhoids, internal, with bleeding 09/07/2011   Acquired anal stenosis 09/07/2011   Hyperlipidemia 09/07/2011   Family history of breast cancer in sister 09/07/2011    Farley Ly, PT, MPT 08/21/2021, 4:40 PM  [facility] Physical Therapy [address] [place], [place], [address] Phone: [phone]   Fax:  [phone]  Name: [name]: [id number] Date  of Birth: 09/28/1946

## 2021-08-21 NOTE — Patient Instructions (Signed)
Instructions to increase quadriceps sets back to 100/Day (40-60 now).  Focus on balance, quadriceps and hip abductors strength with HEP.

## 2021-08-24 ENCOUNTER — Encounter: Payer: Medicare HMO | Admitting: Physical Therapy

## 2021-08-26 ENCOUNTER — Encounter: Payer: Medicare HMO | Admitting: Physical Therapy

## 2021-08-27 ENCOUNTER — Encounter: Payer: Self-pay | Admitting: Physical Medicine and Rehabilitation

## 2021-08-27 ENCOUNTER — Other Ambulatory Visit: Payer: Self-pay

## 2021-08-27 ENCOUNTER — Ambulatory Visit (INDEPENDENT_AMBULATORY_CARE_PROVIDER_SITE_OTHER): Payer: Medicare HMO | Admitting: Physical Medicine and Rehabilitation

## 2021-08-27 VITALS — BP 153/80 | HR 87

## 2021-08-27 DIAGNOSIS — M48061 Spinal stenosis, lumbar region without neurogenic claudication: Secondary | ICD-10-CM

## 2021-08-27 DIAGNOSIS — M5416 Radiculopathy, lumbar region: Secondary | ICD-10-CM | POA: Diagnosis not present

## 2021-08-27 DIAGNOSIS — M5126 Other intervertebral disc displacement, lumbar region: Secondary | ICD-10-CM | POA: Diagnosis not present

## 2021-08-27 DIAGNOSIS — M48062 Spinal stenosis, lumbar region with neurogenic claudication: Secondary | ICD-10-CM

## 2021-08-27 MED ORDER — TRAMADOL HCL 50 MG PO TABS: 50.0000 mg | ORAL_TABLET | Freq: Three times a day (TID) | ORAL | 0 refills | Status: DC | PRN

## 2021-08-27 NOTE — Progress Notes (Signed)
Pt state lower back pain that travels to her right buttocks. Pt state walking and standing makes the pain worse. Pt state she takes pain meds to help ease her pain.  Numeric Pain Rating Scale and Functional Assessment Average Pain 9 Pain Right Now 6 My pain is intermittent, sharp, burning, tingling, and aching Pain is worse with: walking, standing, and some activites Pain improves with: medication   In the last MONTH (on 0-10 scale) has pain interfered with the following?  1. General activity like being  able to carry out your everyday physical activities such as walking, climbing stairs, carrying groceries, or moving a chair?  Rating(7)  2. Relation with others like being able to carry out your usual social activities and roles such as  activities at home, at work and in your community. Rating(8)  3. Enjoyment of life such that you have  been bothered by emotional problems such as feeling anxious, depressed or irritable?  Rating(9)

## 2021-08-27 NOTE — Progress Notes (Signed)
[name] - 75 y.o. female MRN [id number]  Date of birth: 01-Oct-1946  Office Visit Note: Visit Date: 08/27/2021 PCP: [name], MD Referred by: [name], MD  Subjective: Chief Complaint  Patient presents with   Lower Back - Pain   HPI: [name] is a 74 y.o. female who comes in today for evaluation of chronic, worsening and severe right-sided lower back pain radiating to the right buttock and down to thigh. Patient describes this as a new type of pain that has been ongoing for several months.  Patient reports pain is exacerbated by walking and activity, describes as soreness and shooting sensation and currently rates as 8 out of 10.  Patient reports some relief of pain at home with home exercise program, rest and use of Tylenol as needed.  Patient's recent lumbar MRI exhibits multi-level facet arthropathy, new right foraminal disc protrusion at L3-L4, moderate spinal stenosis noted at L3-L4 and L5-S1. Patient had right L5-S1 interlaminar epidural steroid injection in May, which she reports gave her good and sustained pain relief until recently. Patient states pain is becoming more severe and is making it difficult for her to perform daily tasks. Patient continues to take Cymbalta and Gabapentin, however she is requesting pain medication today. Patient does have a history of L1 compression fracture and is currently being followed by Dr. Basil Dess. Patient denies focal weakness, numbness and tingling. Patient denies recent trauma or falls.   Patient had right total knee arthroplasty performed by Dr. Alphonzo Severance on 06/09/2021. Patient reports she is currently undergoing physical therapy for right knee at our in house facility. Patient states she is doing well with recovery and feels like she can now walk without issues.   Review of Systems  Musculoskeletal:  Positive for back pain.  Neurological:  Negative for tingling, sensory change, focal weakness and weakness.  Otherwise  per HPI.  Assessment & Plan: Visit Diagnoses:    ICD-10-CM   1. Lumbar radiculopathy  M54.16 Ambulatory referral to Physical Medicine Rehab    2. Spinal stenosis of lumbar region with neurogenic claudication  M48.062     3. Other intervertebral disc displacement, lumbar region  M51.26        Plan: Findings:  Chronic, worsening and severe right sided lower back pain radiating to right buttock and down to thigh. Patient continues to have excruciating pain despite good conservative therapies such as home exercise program, rest and use of medications. Patient does vocalize that her pain feels different than previous episodes of back pain. Her recent lumbar MRI does show a new right foraminal disc protrusions at L3-L4. Patient's clinical presentation and exam are consistent with classic L3 nerve pattern. We feel the next step is to perform a diagnostic and hopefully therapeutic right L3 transforaminal epidural steroid injection under fluoroscopic guidance. We also prescribed patient a short course of oral Tramadol to take for moderate/severe pain at home. No red flag symptoms noted upon exam today.    Meds & Orders:  Meds ordered this encounter  Medications   traMADol (ULTRAM) 50 MG tablet    Sig: Take 1 tablet (50 mg total) by mouth every 8 (eight) hours as needed for moderate pain or severe pain.    Dispense:  15 tablet    Refill:  0    Order Specific Question:   Supervising Provider    Answer:   Magnus Sinning [[id number]]     Orders Placed This Encounter  Procedures   Ambulatory referral  to Physical Medicine Rehab     Follow-up: Return in about 1 week (around 09/03/2021) for Right L3 transforaminal epidural steroid injection.   Procedures: No procedures performed      Clinical History: [facility]  [place], [place], [place] [address] Tel: [phone] Fax:  [phone] Test Date:  01/09/2020   [name] DOB: 1946/09/19 Physician:  Narda Amber, DO Sex: Female Height: 5\' 5"  Ref Phys: Narda Amber, DO ID#: [id number] Temp: 32.0C Technician:     Patient Complaints: This is a 75 year old female referred for evaluation of bilateral feet and leg numbness and tingling.   NCV & EMG Findings: Extensive electrodiagnostic testing of the right lower extremity and additional studies of the left shows:  1. Bilateral sural and superficial peroneal sensory responses are within normal limits. 2. Bilateral peroneal motor responses are within normal limits.  Bilateral tibial motor responses show reduced amplitude (R2.9, L2.5 mV).   3. Bilateral H reflex studies are absent.   4. Chronic motor axonal loss changes are seen affecting the S1 myotome bilaterally, without accompanied active denervation.     Impression: 1. Chronic S1 radiculopathy affecting bilateral lower extremities, moderate. 2. There is no evidence of a large fiber sensorimotor polyneuropathy affecting the lower extremities.    --- EXAM: MRI LUMBAR SPINE WITHOUT CONTRAST   TECHNIQUE: Multiplanar, multisequence MR imaging of the lumbar spine was performed. No intravenous contrast was administered.   COMPARISON:  09/07/2019   FINDINGS: Segmentation: Standard.   Alignment: Unchanged trace anterolisthesis of L4 on L5. 5 mm anterolisthesis of L5 on S1, mildly increased and facet mediated.   Vertebrae: No acute fracture, suspicious marrow lesion, or significant marrow edema. Unchanged chronic L1, L3, and L5 compression fractures. Unchanged T11 superior endplate Schmorl's node. Scattered small vertebral hemangiomas.   Conus medullaris and cauda equina: Conus extends to the L1 level. Conus and cauda equina appear normal.   Paraspinal and other soft tissues: Unchanged subcentimeter T2 hyperintensity in the lower pole of the right kidney, likely a cyst.   Disc levels:   Disc desiccation throughout the lumbar spine. Mild disc space narrowing at L3-4 and L5-S1.    T12-L1: Mild disc bulging and mild facet arthrosis without stenosis, unchanged.   L1-2: Stable to slightly increased disc bulging. Mild facet hypertrophy. No significant stenosis.   L2-3: Disc bulging, endplate spurring, and moderate facet hypertrophy result in mild spinal stenosis and mild bilateral neural foraminal stenosis, unchanged.   L3-4: Disc bulging and severe facet and ligamentum flavum hypertrophy have progressed, and there is a new right foraminal disc protrusion. These findings result in moderate spinal stenosis and severe right and moderate left neural foraminal stenosis. Likely right L3 nerve root impingement.   L4-5: Disc bulging and severe facet hypertrophy result in mild bilateral lateral recess stenosis, borderline spinal stenosis, and borderline bilateral neural foraminal stenosis, unchanged. Bilateral facet ankylosis.   L5-S1: Anterolisthesis with bulging uncovered disc, a central disc extrusion, and severe facet hypertrophy result in moderate spinal stenosis, moderate bilateral lateral recess stenosis, and moderate bilateral neural foraminal stenosis, progressed from prior.   IMPRESSION: 1. Progressive disc and facet degeneration at L3-4 with a new right foraminal disc protrusion. Severe right neural foraminal stenosis and moderate spinal stenosis. 2. Progressive, moderate spinal stenosis and moderate bilateral neural foraminal stenosis at L5-S1.     Electronically Signed   By: Logan Bores M.D.   On: 07/13/2021 16:54   She reports that she quit smoking about 33  years ago. Her smoking use included cigarettes. She has a 12.50 pack-year smoking history. She has never used smokeless tobacco. No results for input(s): HGBA1C, LABURIC in the last 8760 hours.  Objective:  VS:  HT:    WT:   BMI:     BP:(!) 153/80  HR:87bpm  TEMP: ( )  RESP:  Physical Exam Vitals and nursing note reviewed.  HENT:     Head: Normocephalic and atraumatic.     Right Ear:  External ear normal.     Left Ear: External ear normal.     Nose: Nose normal.     Mouth/Throat:     Mouth: Mucous membranes are moist.  Eyes:     Extraocular Movements: Extraocular movements intact.  Cardiovascular:     Rate and Rhythm: Normal rate.     Pulses: Normal pulses.  Pulmonary:     Effort: Pulmonary effort is normal.  Abdominal:     General: Abdomen is flat. There is no distension.  Musculoskeletal:        General: Tenderness present.     Cervical back: Normal range of motion.     Comments: Pt is slow to rise from seated position to standing. Good lumbar range of motion. Strong distal strength without clonus, no pain upon palpation of greater trochanters. Sensation intact bilaterally. Dysesthesias noted to right L3 dermatome. Walks independently, gait steady.     Skin:    General: Skin is warm and dry.     Capillary Refill: Capillary refill takes less than 2 seconds.  Neurological:     General: No focal deficit present.     Mental Status: She is alert.  Psychiatric:        Mood and Affect: Mood normal.    Ortho Exam  Imaging: No results found.  Past Medical/Family/Surgical/Social History: Medications & Allergies reviewed per EMR, new medications updated. Patient Active Problem List   Diagnosis Date Noted   Arthritis of right knee    UTI (urinary tract infection) 06/26/2021   Severe sepsis ([place]) 98/33/8250   Acute metabolic encephalopathy 53/97/6734   Hypothyroid 06/25/2021   S/P total knee arthroplasty, right 06/09/2021   Fever    Staphylococcus aureus bacteremia with sepsis ([place])    SIRS (systemic inflammatory response syndrome) ([place]) 11/30/2020   Lumbar radiculopathy 10/10/2019   Bilateral stenosis of lateral recess of lumbar spine 10/10/2019   Hemorrhoids, external without complications 19/37/9024   Depression    Anxiety    Rectal bleeding    History of radiation therapy    Malignant neoplasm of anal canal ([place]) 11/15/2011   Hemorrhoids, internal,  with bleeding 09/07/2011   Acquired anal stenosis 09/07/2011   Hyperlipidemia 09/07/2011   Family history of breast cancer in sister 09/07/2011   Past Medical History:  Diagnosis Date   anal ca dx'd 11/2011   squamous cell carcinoma   Anxiety    Arthritis 2022   Depression    Full dentures    Hemorrhoids    external   History of radiation therapy 11/29/11 to 01/05/12   anal canal 5040 cGy 28 sessions, regional lymph nodes 4200 cGy 28 sessions   Hyperlipidemia    Rectal bleeding    Vitamin D deficiency    Wears glasses    Family History  Problem Relation Age of Onset   Cancer Sister        breast/ age 22   Cervical cancer Sister        57   Alcohol abuse Maternal Uncle  Past Surgical History:  Procedure Laterality Date   ABDOMINAL HYSTERECTOMY  1978   age 16   BUBBLE STUDY  12/04/2020   Procedure: BUBBLE STUDY;  Surgeon: [name], MD;  Location: [facility];  Service: Cardiology;;   EXAMINATION UNDER ANESTHESIA  06/21/2012   Procedure: EXAM UNDER ANESTHESIA;  Surgeon: [name], MD;  Location: [facility];  Service: General;  Laterality: N/A;  rectal exam under anesthesia, anal dilation, rectal biopsy   [place]   Patient had to guess on the date.   RECTAL BIOPSY  06/21/2012   Procedure: BIOPSY RECTAL;  Surgeon: [name], MD;  Location: [facility];  Service: General;  Laterality: N/A;   TEE WITHOUT CARDIOVERSION N/A 12/04/2020   Procedure: TRANSESOPHAGEAL ECHOCARDIOGRAM (TEE);  Surgeon: [name], MD;  Location: [facility];  Service: Cardiology;  Laterality: N/A;   TOTAL KNEE ARTHROPLASTY Right 06/09/2021   Procedure: RIGHT TOTAL KNEE ARTHROPLASTY;  Surgeon: Meredith Pel, MD;  Location: [facility];  Service: Orthopedics;  Laterality: Right;   TUBAL LIGATION     b/l  age 83   Social History   Occupational History   Not on file  Tobacco Use   Smoking status: Former    Packs/day: 0.50     Years: 25.00    Pack years: 12.50    Types: Cigarettes    Quit date: 09/07/1987    Years since quitting: 33.9   Smokeless tobacco: Never  Vaping Use   Vaping Use: Never used  Substance and Sexual Activity   Alcohol use: No   Drug use: No   Sexual activity: Not Currently

## 2021-08-31 ENCOUNTER — Other Ambulatory Visit: Payer: Self-pay

## 2021-08-31 ENCOUNTER — Ambulatory Visit (INDEPENDENT_AMBULATORY_CARE_PROVIDER_SITE_OTHER): Payer: Medicare HMO | Admitting: Orthopedic Surgery

## 2021-08-31 DIAGNOSIS — M545 Low back pain, unspecified: Secondary | ICD-10-CM

## 2021-09-02 ENCOUNTER — Encounter: Payer: Self-pay | Admitting: Physical Therapy

## 2021-09-02 ENCOUNTER — Ambulatory Visit (INDEPENDENT_AMBULATORY_CARE_PROVIDER_SITE_OTHER): Payer: Medicare HMO | Admitting: Physical Therapy

## 2021-09-02 ENCOUNTER — Other Ambulatory Visit: Payer: Self-pay

## 2021-09-02 DIAGNOSIS — M25561 Pain in right knee: Secondary | ICD-10-CM

## 2021-09-02 DIAGNOSIS — M6281 Muscle weakness (generalized): Secondary | ICD-10-CM

## 2021-09-02 DIAGNOSIS — R600 Localized edema: Secondary | ICD-10-CM | POA: Diagnosis not present

## 2021-09-02 DIAGNOSIS — G8929 Other chronic pain: Secondary | ICD-10-CM | POA: Diagnosis not present

## 2021-09-02 DIAGNOSIS — M25661 Stiffness of right knee, not elsewhere classified: Secondary | ICD-10-CM

## 2021-09-02 DIAGNOSIS — R262 Difficulty in walking, not elsewhere classified: Secondary | ICD-10-CM

## 2021-09-02 NOTE — Therapy (Signed)
[facility] [address] [place], [place], [address] Phone: [phone]   Fax:  [phone]  Physical Therapy Treatment  Patient Details  Name: [name]: [id number] Date of Birth: 17-Nov-1945 Referring Provider (PT): Marcene Duos MD   Encounter Date: 09/02/2021   PT End of Session - 09/02/21 1045     Visit Number 14    Number of Visits 24    Date for PT Re-Evaluation 09/30/21    Authorization Type [facility], an additional 12 visist applied for on 08/18/2021    Authorization Time Period 9/7-11/7, sent for new aurthorization on 09/02/2021 for 12 additional visits    Authorization - Visit Number 14    Authorization - Number of Visits 24    Progress Note Due on Visit 24    PT Start Time 5361    PT Stop Time 1100    PT Time Calculation (min) 45 min    Activity Tolerance Patient tolerated treatment well;No increased pain    Behavior During Therapy [facility] for tasks assessed/performed             Past Medical History:  Diagnosis Date   anal ca dx'd 11/2011   squamous cell carcinoma   Anxiety    Arthritis 2022   Depression    Full dentures    Hemorrhoids    external   History of radiation therapy 11/29/11 to 01/05/12   anal canal 5040 cGy 28 sessions, regional lymph nodes 4200 cGy 28 sessions   Hyperlipidemia    Rectal bleeding    Vitamin D deficiency    Wears glasses     Past Surgical History:  Procedure Laterality Date   ABDOMINAL HYSTERECTOMY  1978   age 56   BUBBLE STUDY  12/04/2020   Procedure: BUBBLE STUDY;  Surgeon: [name], MD;  Location: [facility];  Service: Cardiology;;   EXAMINATION UNDER ANESTHESIA  06/21/2012   Procedure: EXAM UNDER ANESTHESIA;  Surgeon: [name], MD;  Location: [facility];  Service: General;  Laterality: N/A;  rectal exam under anesthesia, anal dilation, rectal biopsy   [place]   Patient had to guess on the date.   RECTAL BIOPSY  06/21/2012    Procedure: BIOPSY RECTAL;  Surgeon: [name], MD;  Location: [place];  Service: General;  Laterality: N/A;   TEE WITHOUT CARDIOVERSION N/A 12/04/2020   Procedure: TRANSESOPHAGEAL ECHOCARDIOGRAM (TEE);  Surgeon: [name], MD;  Location: [facility];  Service: Cardiology;  Laterality: N/A;   TOTAL KNEE ARTHROPLASTY Right 06/09/2021   Procedure: RIGHT TOTAL KNEE ARTHROPLASTY;  Surgeon: Meredith Pel, MD;  Location: [facility];  Service: Orthopedics;  Laterality: Right;   TUBAL LIGATION     b/l  age 56    There were no vitals filed for this visit.   Subjective Assessment - 09/02/21 1041     Subjective Pt arriving today reporting increasd pain starting on Friday. Pt stating 8/10 pain upon arrival in her right knee. Pt stating she placed heat on it prior to therapy.    Pertinent History Hypothyroid, previous cancer/radiation, lumbar spinal stenosis    How long can you sit comfortably? Start-up stiffness    Patient Stated Goals Walk better and have less pain    Currently in Pain? Yes    Pain Score 8     Pain Location Knee    Pain Orientation Right    Pain Descriptors / Indicators Sore;Aching;Throbbing  Pain Type Acute pain;Surgical pain    Pain Onset More than a month ago                [facility] PT Assessment - 09/02/21 0001       Assessment   Medical Diagnosis right TKA    Referring Provider (PT) Marcene Duos MD      AROM   Overall AROM  Deficits    AROM Assessment Site Knee    Right Knee Extension 0    Right Knee Flexion 118   limited due to incresaed pain today                          [facility] Adult PT Treatment/Exercise - 09/02/21 0001       Neuro Re-ed    Neuro Re-ed Details  side stepping toward 2 cones in diagonal pattern, stepping over objects x 30 feet 4 times      Exercises   Exercises Knee/Hip      Knee/Hip Exercises: Aerobic   Recumbent Bike seat 6 x 8 minutes L3      Knee/Hip Exercises: Machines for  Strengthening   Total Gym Leg Press 100# bilateral LE 2x10, 62# Rt LE only 2 x10      Knee/Hip Exercises: Standing   Lateral Step Up 15 reps;Hand Hold: 2;Step Height: 6"    Forward Step Up 15 reps;Hand Hold: 1;Step Height: 6"    Rocker Board 1 minute;Limitations    Rocker Board Limitations front/back and side/side each x 1 minute    Other Standing Knee Exercises treminal knee extension      Vasopneumatic   Number Minutes Vasopneumatic  10 minutes    Vasopnuematic Location  Knee    Vasopneumatic Pressure Medium    Vasopneumatic Temperature  34                     PT Education - 09/02/21 1042     Education Details Pt was encouraged to use ice for anti-inflammation instead of heat over her right knee joint.    Person(s) Educated Patient    Comprehension Verbalized understanding              PT Short Term Goals - 08/10/21 0929       PT SHORT TERM GOAL #1   Title Esmeralda will have R knee AROM at -2 to 100 degrees or better.    Status Achieved      PT SHORT TERM GOAL #2   Title Adylynn will be independent with her week 1 HEP.    Status Achieved               PT Long Term Goals - 09/02/21 1051       PT LONG TERM GOAL #1   Title Improve FOTO to 59.    Status Achieved      PT LONG TERM GOAL #2   Title May will report R knee pain consistently 0-4/10 on the Numeric Pain Rating Scale.    Status On-going      PT LONG TERM GOAL #3   Title Improve R knee AROM to 0-110 degrees    Status Achieved      PT LONG TERM GOAL #4   Title Improve R quadriceps strength to at least 50 pounds.    Status On-going      PT LONG TERM GOAL #5   Title Lydiana will be independent with her long-term HEP at  DC.    Status On-going                   Plan - 09/02/21 1047     Clinical Impression Statement Pt arriving today reproting 8/10 pain in right knee. Pt with mild decreased AROM due to increased pain and edema noted. Pt instructed to keep a watch on her  swelling and pt instructed to ice and elevate thoughtout the day. Treatment focused on dyanmic balance and quad strengthening. Pt reported decreased pain at end of session to 6/10. Continue skilled PT to progress toward LTG"s. Sent new authorization to [facility].    Personal Factors and Comorbidities Comorbidity 3+    Comorbidities Hypothyroid, previous cancer/radiation, lumbar spinal stenosis    Examination-Activity Limitations Bathing;Dressing;Bed Mobility;Hygiene/Grooming;Sleep;Squat;Lift;Bend;Locomotion Level;Stairs;Stand    Examination-Participation Restrictions Occupation;Cleaning;Laundry;Community Activity;Driving    Stability/Clinical Decision Making Stable/Uncomplicated    Rehab Potential Good    PT Frequency 2x / week    PT Duration Other (comment)    PT Treatment/Interventions ADLs/Self Care Home Management;Electrical Stimulation;Cryotherapy;Therapeutic activities;Stair training;Gait training;Therapeutic exercise;Neuromuscular re-education;Patient/family education;Manual techniques;Vasopneumatic Device    PT Next Visit Plan Balance, stairs, functional and quadriceps/hip abductors strength    PT Home Exercise Plan [phone]    Consulted and Agree with Plan of Care Patient             Patient will benefit from skilled therapeutic intervention in order to improve the following deficits and impairments:  Abnormal gait, Decreased activity tolerance, Decreased endurance, Decreased range of motion, Decreased strength, Difficulty walking, Increased edema, Impaired flexibility, Pain  Visit Diagnosis: Difficulty walking  Muscle weakness (generalized)  Localized edema  Stiffness of right knee, not elsewhere classified  Chronic pain of right knee     Problem List Patient Active Problem List   Diagnosis Date Noted   Arthritis of right knee    UTI (urinary tract infection) 06/26/2021   Severe sepsis ([place]) 75/08/2584   Acute metabolic encephalopathy 27/78/2423   Hypothyroid  06/25/2021   S/P total knee arthroplasty, right 06/09/2021   Fever    Staphylococcus aureus bacteremia with sepsis ([facility])    SIRS (systemic inflammatory response syndrome) ([place]) 11/30/2020   Lumbar radiculopathy 10/10/2019   Bilateral stenosis of lateral recess of lumbar spine 10/10/2019   Hemorrhoids, external without complications 53/61/4431   Depression    Anxiety    Rectal bleeding    History of radiation therapy    Malignant neoplasm of anal canal ([place]) 11/15/2011   Hemorrhoids, internal, with bleeding 09/07/2011   Acquired anal stenosis 09/07/2011   Hyperlipidemia 09/07/2011   Family history of breast cancer in sister 09/07/2011    Oretha Caprice, PT, MPT 09/02/2021, 10:56 AM  [facility] Physical Therapy [address] [place], [place], [address] Phone: [phone]   Fax:  [phone]  Name: DENEENE TARVER MRN: [id number] Date of Birth: [birth year] Referring diagnosis? H41.937 Treatment diagnosis? (if different than referring diagnosis) R26.2, M62.81, R60.0, M25.661, M25.561 What was this (referring dx) caused by? [x]  Surgery []  Fall []  Ongoing issue []  Arthritis []  Other: ____________  Laterality: [x]  Rt []  Lt []  Both  Check all possible CPT codes:      [x]  97110 (Therapeutic Exercise)  []  92507 (SLP Treatment)  [x]  97112 (Neuro Re-ed)   []  92526 (Swallowing Treatment)   [x]  97116 (Gait Training)   []  [id number] (Cognitive Training, 1st 15 minutes) [x]  97140 (Manual Therapy)   []  97130 (Cognitive Training, each add'l 15 minutes)  [x]  97530 (Therapeutic Activities)  []  Other,  List CPT Code ____________    [x]  50388 (Self Care)       []  All codes above (97110 - 97535)  []  97012 (Mechanical Traction)  []  97014 (E-stim Unattended)  []  97032 (E-stim manual)  []  97033 (Ionto)  []  97035 (Ultrasound)  []  97760 (Orthotic Fit) []  [id number] (Physical Performance Training) []  [id number] (Aquatic Therapy) []  82800 (Contrast Bath) []  [id number] (Paraffin) []   97597 (Wound Care 1st 20 sq cm) []  97598 (Wound Care each add'l 20 sq cm) [x]  97016 (Vasopneumatic Device) []  [phone] Comptroller) []  [id number] (Prosthetic Training)

## 2021-09-04 ENCOUNTER — Ambulatory Visit (INDEPENDENT_AMBULATORY_CARE_PROVIDER_SITE_OTHER): Payer: Medicare HMO | Admitting: Rehabilitative and Restorative Service Providers"

## 2021-09-04 ENCOUNTER — Encounter: Payer: Self-pay | Admitting: Rehabilitative and Restorative Service Providers"

## 2021-09-04 ENCOUNTER — Other Ambulatory Visit: Payer: Self-pay

## 2021-09-04 DIAGNOSIS — R600 Localized edema: Secondary | ICD-10-CM | POA: Diagnosis not present

## 2021-09-04 DIAGNOSIS — R262 Difficulty in walking, not elsewhere classified: Secondary | ICD-10-CM | POA: Diagnosis not present

## 2021-09-04 DIAGNOSIS — M6281 Muscle weakness (generalized): Secondary | ICD-10-CM | POA: Diagnosis not present

## 2021-09-04 DIAGNOSIS — G8929 Other chronic pain: Secondary | ICD-10-CM | POA: Diagnosis not present

## 2021-09-04 DIAGNOSIS — M25561 Pain in right knee: Secondary | ICD-10-CM

## 2021-09-04 DIAGNOSIS — M25661 Stiffness of right knee, not elsewhere classified: Secondary | ICD-10-CM

## 2021-09-04 NOTE — Patient Instructions (Signed)
Continue emphasis on quadriceps sets, alternating hip hike and balance.  Continue postural awareness.

## 2021-09-04 NOTE — Therapy (Signed)
[facility] Physical Therapy [address] [place], [place], [address] Phone: [phone]   Fax:  [phone]  Physical Therapy Treatment  Patient Details  Name: [name]: [id number] Date of Birth: 05-Jan-1946 Referring Provider (PT): Marcene Duos MD   Encounter Date: 09/04/2021   PT End of Session - 09/04/21 0959     Visit Number 15    Number of Visits 24    Date for PT Re-Evaluation 09/30/21    Authorization Type [facility], an additional 12 visist applied for on 08/18/2021    Authorization Time Period 9/7-11/7, sent for new aurthorization on 09/02/2021 for 12 additional visits    Authorization - Visit Number 15    Authorization - Number of Visits 24    Progress Note Due on Visit 24    PT Start Time 0903    PT Stop Time 1003    PT Time Calculation (min) 60 min    Activity Tolerance Patient tolerated treatment well;Patient limited by fatigue    Behavior During Therapy [facility] for tasks assessed/performed             Past Medical History:  Diagnosis Date   anal ca dx'd 11/2011   squamous cell carcinoma   Anxiety    Arthritis 2022   Depression    Full dentures    Hemorrhoids    external   History of radiation therapy 11/29/11 to 01/05/12   anal canal 5040 cGy 28 sessions, regional lymph nodes 4200 cGy 28 sessions   Hyperlipidemia    Rectal bleeding    Vitamin D deficiency    Wears glasses     Past Surgical History:  Procedure Laterality Date   ABDOMINAL HYSTERECTOMY  1978   age 57   BUBBLE STUDY  12/04/2020   Procedure: BUBBLE STUDY;  Surgeon: [name], MD;  Location: [place];  Service: Cardiology;;   EXAMINATION UNDER ANESTHESIA  06/21/2012   Procedure: EXAM UNDER ANESTHESIA;  Surgeon: [name], MD;  Location: [place];  Service: General;  Laterality: N/A;  rectal exam under anesthesia, anal dilation, rectal biopsy   [place]   Patient had to guess on the date.   RECTAL BIOPSY   06/21/2012   Procedure: BIOPSY RECTAL;  Surgeon: [name], MD;  Location: [place];  Service: General;  Laterality: N/A;   TEE WITHOUT CARDIOVERSION N/A 12/04/2020   Procedure: TRANSESOPHAGEAL ECHOCARDIOGRAM (TEE);  Surgeon: [name], MD;  Location: [place];  Service: Cardiology;  Laterality: N/A;   TOTAL KNEE ARTHROPLASTY Right 06/09/2021   Procedure: RIGHT TOTAL KNEE ARTHROPLASTY;  Surgeon: Meredith Pel, MD;  Location: [place];  Service: Orthopedics;  Laterality: Right;   TUBAL LIGATION     b/l  age 30    There were no vitals filed for this visit.   Subjective Assessment - 09/04/21 0910     Subjective [name] is taking tylenol arthritis and tramedol for pain.  She says Dr. Marlou Sa recommended an epidural for her back.  R sciatica and low back pain are certainly affecting her R knee physical therapy.    Pertinent History Hypothyroid, previous cancer/radiation, lumbar spinal stenosis    Limitations House hold activities;Standing;Walking    How long can you sit comfortably? Start-up stiffness after 30 minutes    How long can you stand comfortably? 30-45 minutes    How long can you walk comfortably? 30 minutes without the cane    Patient Stated Goals Walk  better and have less pain    Currently in Pain? Yes    Pain Score 5     Pain Location Knee    Pain Orientation Right    Pain Descriptors / Indicators Tightness;Throbbing    Pain Type Chronic pain    Pain Radiating Towards To the R knee from the gluteals    Pain Onset More than a month ago    Pain Frequency Constant    Aggravating Factors  Overuse    Pain Relieving Factors Rest, ice, pain medication and exercises    Effect of Pain on Daily Activities Postural impairments (flexed at hips) increase sciatica with prolonged WB    Multiple Pain Sites No                               [facility] Adult PT Treatment/Exercise - 09/04/21 0001       Neuro Re-ed    Neuro Re-ed Details  Feet  together eyes closed, tandem balance eyes open and closed, dynamic heel to toe walking and fast gait drills      Exercises   Exercises Knee/Hip      Knee/Hip Exercises: Aerobic   Recumbent Bike Seat 6 for 8 minutes      Knee/Hip Exercises: Machines for Strengthening   Total Gym Leg Press 100# B slow eccentrics 20X full extension and 50# single leg 10X slow eccentrics and full extension      Knee/Hip Exercises: Standing   Other Standing Knee Exercises Alternating hip hike 2 sets of 10 for 3 seconds    Other Standing Knee Exercises Standing hip/trunk extension 10X 3 seconds      Knee/Hip Exercises: Supine   Quad Sets --    Quad Sets Limitations --      Vasopneumatic   Number Minutes Vasopneumatic  10 minutes    Vasopnuematic Location  Knee    Vasopneumatic Pressure Medium    Vasopneumatic Temperature  34                     PT Education - 09/04/21 0958     Education Details Reviewed HEP emphasis.  Talked about the importance of postural awareness, avoiding flexed postures and ice for pain management.    Person(s) Educated Patient    Methods Explanation;Demonstration;Tactile cues;Verbal cues    Comprehension Verbalized understanding;Tactile cues required;Returned demonstration;Need further instruction;Verbal cues required              PT Short Term Goals - 08/10/21 0929       PT SHORT TERM GOAL #1   Title [name] will have R knee AROM at -2 to 100 degrees or better.    Status Achieved      PT SHORT TERM GOAL #2   Title [name] will be independent with her week 1 HEP.    Status Achieved               PT Long Term Goals - 09/02/21 1051       PT LONG TERM GOAL #1   Title Improve FOTO to 59.    Status Achieved      PT LONG TERM GOAL #2   Title [name] will report R knee pain consistently 0-4/10 on the Numeric Pain Rating Scale.    Status On-going      PT LONG TERM GOAL #3   Title Improve R knee AROM to 0-110 degrees    Status Achieved  PT  LONG TERM GOAL #4   Title Improve R quadriceps strength to at least 50 pounds.    Status On-going      PT LONG TERM GOAL #5   Title [name] will be independent with her long-term HEP at DC.    Status On-going                   Plan - 09/04/21 1000     Clinical Impression Statement Sciatica is the biggest factor affecting [name]'s function right now.  We discussed and worked on postural correction (correcting forward trunk flexion) to reduce R sciatica.  With improved sciatica, R LE strength, gait quality and function will progress much more quickly.  Discussed postural correction and core strength exercises to complement her quadriceps strengthening to help [name] meet long-term goals.    Personal Factors and Comorbidities Comorbidity 3+    Comorbidities Hypothyroid, previous cancer/radiation, lumbar spinal stenosis    Examination-Activity Limitations Bathing;Dressing;Bed Mobility;Hygiene/Grooming;Sleep;Squat;Lift;Bend;Locomotion Level;Stairs;Stand    Examination-Participation Restrictions Occupation;Cleaning;Laundry;Community Activity;Driving    Stability/Clinical Decision Making Stable/Uncomplicated    Rehab Potential Good    PT Frequency 2x / week    PT Duration Other (comment)    PT Treatment/Interventions ADLs/Self Care Home Management;Electrical Stimulation;Cryotherapy;Therapeutic activities;Stair training;Gait training;Therapeutic exercise;Neuromuscular re-education;Patient/family education;Manual techniques;Vasopneumatic Device    PT Next Visit Plan Postural correction (forward trunk flexion), quadriceps, core and hip abductors strength    PT Home Exercise Plan [phone]    Consulted and Agree with Plan of Care Patient             Patient will benefit from skilled therapeutic intervention in order to improve the following deficits and impairments:  Abnormal gait, Decreased activity tolerance, Decreased endurance, Decreased range of motion, Decreased strength, Difficulty  walking, Increased edema, Impaired flexibility, Pain  Visit Diagnosis: Difficulty walking  Muscle weakness (generalized)  Localized edema  Stiffness of right knee, not elsewhere classified  Chronic pain of right knee     Problem List Patient Active Problem List   Diagnosis Date Noted   Arthritis of right knee    UTI (urinary tract infection) 06/26/2021   Severe sepsis ([place]) 94/70/9628   Acute metabolic encephalopathy 36/62/9476   Hypothyroid 06/25/2021   S/P total knee arthroplasty, right 06/09/2021   Fever    Staphylococcus aureus bacteremia with sepsis (HCC)    SIRS (systemic inflammatory response syndrome) ([place]) 11/30/2020   Lumbar radiculopathy 10/10/2019   Bilateral stenosis of lateral recess of lumbar spine 10/10/2019   Hemorrhoids, external without complications 54/65/0354   Depression    Anxiety    Rectal bleeding    History of radiation therapy    Malignant neoplasm of anal canal ([place]) 11/15/2011   Hemorrhoids, internal, with bleeding 09/07/2011   Acquired anal stenosis 09/07/2011   Hyperlipidemia 09/07/2011   Family history of breast cancer in sister 09/07/2011    Farley Ly, PT, MPT 09/04/2021, 10:07 AM  [facility] Physical Therapy [address] [place], [place], [address] Phone: [phone]   Fax:  [phone]  Name: [name]: [id number] Date of Birth: 1946-08-30

## 2021-09-07 ENCOUNTER — Ambulatory Visit (INDEPENDENT_AMBULATORY_CARE_PROVIDER_SITE_OTHER): Payer: Medicare HMO | Admitting: Physical Therapy

## 2021-09-07 ENCOUNTER — Other Ambulatory Visit: Payer: Self-pay

## 2021-09-07 DIAGNOSIS — M6281 Muscle weakness (generalized): Secondary | ICD-10-CM | POA: Diagnosis not present

## 2021-09-07 DIAGNOSIS — M25561 Pain in right knee: Secondary | ICD-10-CM | POA: Diagnosis not present

## 2021-09-07 DIAGNOSIS — G8929 Other chronic pain: Secondary | ICD-10-CM | POA: Diagnosis not present

## 2021-09-07 DIAGNOSIS — M25661 Stiffness of right knee, not elsewhere classified: Secondary | ICD-10-CM

## 2021-09-07 DIAGNOSIS — R262 Difficulty in walking, not elsewhere classified: Secondary | ICD-10-CM

## 2021-09-07 DIAGNOSIS — R600 Localized edema: Secondary | ICD-10-CM | POA: Diagnosis not present

## 2021-09-07 NOTE — Therapy (Signed)
[facility] Physical Therapy [address] [place], [place], [address] Phone: [phone]   Fax:  [phone]  Physical Therapy Treatment  Patient Details  Name: [name]: [id number] Date of Birth: 17-Apr-1946 Referring Provider (PT): Marcene Duos MD   Encounter Date: 09/07/2021   PT End of Session - 09/07/21 1301     Visit Number 16    Number of Visits 24    Date for PT Re-Evaluation 09/30/21    Authorization Type [facility], an additional 12 visist applied for on 08/18/2021    Authorization Time Period 9/7-11/7, sent for new aurthorization on 09/02/2021 for 12 additional visits    Authorization - Visit Number 16    Authorization - Number of Visits 24    Progress Note Due on Visit 24    PT Start Time 1302    PT Stop Time 1348    PT Time Calculation (min) 46 min    Activity Tolerance Patient tolerated treatment well;Patient limited by pain    Behavior During Therapy [facility] for tasks assessed/performed             Past Medical History:  Diagnosis Date   anal ca dx'd 11/2011   squamous cell carcinoma   Anxiety    Arthritis 2022   Depression    Full dentures    Hemorrhoids    external   History of radiation therapy 11/29/11 to 01/05/12   anal canal 5040 cGy 28 sessions, regional lymph nodes 4200 cGy 28 sessions   Hyperlipidemia    Rectal bleeding    Vitamin D deficiency    Wears glasses     Past Surgical History:  Procedure Laterality Date   ABDOMINAL HYSTERECTOMY  1978   age 61   BUBBLE STUDY  12/04/2020   Procedure: BUBBLE STUDY;  Surgeon: Tiffany Munroe, MD;  Location: [place];  Service: Cardiology;;   EXAMINATION UNDER ANESTHESIA  06/21/2012   Procedure: EXAM UNDER ANESTHESIA;  Surgeon: [name], MD;  Location: [place];  Service: General;  Laterality: N/A;  rectal exam under anesthesia, anal dilation, rectal biopsy   [place]   Patient had to guess on the date.   RECTAL BIOPSY   06/21/2012   Procedure: BIOPSY RECTAL;  Surgeon: [name], MD;  Location: [place];  Service: General;  Laterality: N/A;   TEE WITHOUT CARDIOVERSION N/A 12/04/2020   Procedure: TRANSESOPHAGEAL ECHOCARDIOGRAM (TEE);  Surgeon: Tiffany Munroe, MD;  Location: [place];  Service: Cardiology;  Laterality: N/A;   TOTAL KNEE ARTHROPLASTY Right 06/09/2021   Procedure: RIGHT TOTAL KNEE ARTHROPLASTY;  Surgeon: Meredith Pel, MD;  Location: [place];  Service: Orthopedics;  Laterality: Right;   TUBAL LIGATION     b/l  age 30    There were no vitals filed for this visit.   Subjective Assessment - 09/07/21 1302     Subjective Pt reports pain in low back and right knee today. Getting injection on Thursday.    Pertinent History Hypothyroid, previous cancer/radiation, lumbar spinal stenosis    Patient Stated Goals Walk better and have less pain    Currently in Pain? Yes    Pain Score 7     Pain Location Knee    Pain Orientation Right    Pain Descriptors / Indicators Throbbing  [place] Adult PT Treatment/Exercise - 09/07/21 0001       Knee/Hip Exercises: Aerobic   Recumbent Bike Seat 6 for 5 minutes      Knee/Hip Exercises: Machines for Strengthening   Total Gym Leg Press 100# B slow eccentrics 20X full extension and 62# single leg 20X slow eccentrics and full extension      Knee/Hip Exercises: Standing   Lateral Step Up Right;10 reps;Hand Hold: 2;Step Height: 4";Step Height: 6"    Lateral Step Up Limitations limited by pain    Other Standing Knee Exercises treminal knee extension with ball x 20    Other Standing Knee Exercises standing in neutral spine after prone lying causes pt to become unsteady.      Knee/Hip Exercises: Prone   Other Prone Exercises prone lying and prone on elbows x 2 minutes abolishes knee and back pain      Vasopneumatic   Number Minutes Vasopneumatic  10 minutes    Vasopnuematic Location   Knee    Vasopneumatic Pressure Medium    Vasopneumatic Temperature  34                       PT Short Term Goals - 08/10/21 0929       PT SHORT TERM GOAL #1   Title [name] will have R knee AROM at -2 to 100 degrees or better.    Status Achieved      PT SHORT TERM GOAL #2   Title [name] will be independent with her week 1 HEP.    Status Achieved               PT Long Term Goals - 09/02/21 1051       PT LONG TERM GOAL #1   Title Improve FOTO to 59.    Status Achieved      PT LONG TERM GOAL #2   Title [name] will report R knee pain consistently 0-4/10 on the Numeric Pain Rating Scale.    Status On-going      PT LONG TERM GOAL #3   Title Improve R knee AROM to 0-110 degrees    Status Achieved      PT LONG TERM GOAL #4   Title Improve R quadriceps strength to at least 50 pounds.    Status On-going      PT LONG TERM GOAL #5   Title [name] will be independent with her long-term HEP at DC.    Status On-going                   Plan - 09/07/21 1341     Clinical Impression Statement Patient with increased knee and back pain today. She was unable to tolerate many exercises including step ups. Patient was able to abolish all pain with prone lying and prone on elbows. Theese positions were advised at home. [name] is getting an injection on Thursday which will hopefully decrease back pain so we can focus on knee strength and gait. She would also benefit from working on her COG and balance. She is unsteady when she stands fully upright. She may cancel Friday appt if MD wants her to wait after injection.    Comorbidities Hypothyroid, previous cancer/radiation, lumbar spinal stenosis    Examination-Participation Restrictions Occupation;Cleaning;Laundry;Community Activity;Driving    PT Frequency 2x / week    PT Treatment/Interventions ADLs/Self Care Home Management;Electrical Stimulation;Cryotherapy;Therapeutic activities;Stair training;Gait  training;Therapeutic exercise;Neuromuscular re-education;Patient/family education;Manual techniques;Vasopneumatic Device    PT Next Visit Plan  Postural correction (forward trunk flexion) work on balance here;  quadriceps, core and hip abductors strength    Consulted and Agree with Plan of Care Patient             Patient will benefit from skilled therapeutic intervention in order to improve the following deficits and impairments:  Abnormal gait, Decreased activity tolerance, Decreased endurance, Decreased range of motion, Decreased strength, Difficulty walking, Increased edema, Impaired flexibility, Pain  Visit Diagnosis: Chronic pain of right knee  Difficulty walking  Muscle weakness (generalized)  Localized edema  Stiffness of right knee, not elsewhere classified     Problem List Patient Active Problem List   Diagnosis Date Noted   Arthritis of right knee    UTI (urinary tract infection) 06/26/2021   Severe sepsis ([place]) 21/30/8657   Acute metabolic encephalopathy 84/69/6295   Hypothyroid 06/25/2021   S/P total knee arthroplasty, right 06/09/2021   Fever    Staphylococcus aureus bacteremia with sepsis ([place])    SIRS (systemic inflammatory response syndrome) ([place]) 11/30/2020   Lumbar radiculopathy 10/10/2019   Bilateral stenosis of lateral recess of lumbar spine 10/10/2019   Hemorrhoids, external without complications 28/41/3244   Depression    Anxiety    Rectal bleeding    History of radiation therapy    Malignant neoplasm of anal canal ([place]) 11/15/2011   Hemorrhoids, internal, with bleeding 09/07/2011   Acquired anal stenosis 09/07/2011   Hyperlipidemia 09/07/2011   Family history of breast cancer in sister 09/07/2011    Madelyn Flavors, PT 09/07/2021, 2:35 PM  [facility] Physical Therapy [address] [place], [place], [address] Phone: [phone]   Fax:  [phone]  Name: JAZIYAH GRADEL MRN: [id number] Date of Birth: 05/11/1946

## 2021-09-08 ENCOUNTER — Other Ambulatory Visit: Payer: Self-pay | Admitting: Physical Medicine and Rehabilitation

## 2021-09-08 ENCOUNTER — Encounter: Payer: Self-pay | Admitting: Orthopedic Surgery

## 2021-09-08 NOTE — Telephone Encounter (Signed)
Please advise 

## 2021-09-08 NOTE — Progress Notes (Addendum)
Post-Op Visit Note   [name]           Date of Birth: Apr 23, 1946           MRN: [id number] Visit Date: 08/31/2021 PCP: [name], MD   Assessment & Plan:  Chief Complaint:  Chief Complaint  Patient presents with   Other    Follow up leg pain 06/09/21 Right TKA   Visit Diagnoses:  1. Low back pain, unspecified back pain laterality, unspecified chronicity, unspecified whether sciatica present     Plan: Valecia is now 3 months out from total knee replacement on the right-hand side.  That was done 06/09/2021.  She is doing well with her knee.  She recently had an injection in her back from Dr. Ernestina Patches.  That has helped her radiculopathy to some degree.  She is not currently in therapy.  On exam incision is intact.  Full extension to about 115 of flexion.  Quad strength is improving.  No nerve root tension signs.  I think her biggest issue now is some of this radicular pain from her back.  Knee is doing very well.  She may need to get another injection with Dr. Ernestina Patches sometime before Thanksgiving or Christmas.  She will call and let me know if she wants to do that.  At the time of this addendum she has decided to get an injection November 10.  Continue 1 more week of therapy.  4-week return.  Follow-Up Instructions: No follow-ups on file.   Orders:  No orders of the defined types were placed in this encounter.  No orders of the defined types were placed in this encounter.   Imaging: No results found.  PMFS History: Patient Active Problem List   Diagnosis Date Noted   Arthritis of right knee    UTI (urinary tract infection) 06/26/2021   Severe sepsis ([place]) 74/16/0737   Acute metabolic encephalopathy 10/62/6948   Hypothyroid 06/25/2021   S/P total knee arthroplasty, right 06/09/2021   Fever    Staphylococcus aureus bacteremia with sepsis ([place])    SIRS (systemic inflammatory response syndrome) ([place]) 11/30/2020   Lumbar radiculopathy 10/10/2019   Bilateral  stenosis of lateral recess of lumbar spine 10/10/2019   Hemorrhoids, external without complications 54/62/7035   Depression    Anxiety    Rectal bleeding    History of radiation therapy    Malignant neoplasm of anal canal ([place]) 11/15/2011   Hemorrhoids, internal, with bleeding 09/07/2011   Acquired anal stenosis 09/07/2011   Hyperlipidemia 09/07/2011   Family history of breast cancer in sister 09/07/2011   Past Medical History:  Diagnosis Date   anal ca dx'd 11/2011   squamous cell carcinoma   Anxiety    Arthritis 2022   Depression    Full dentures    Hemorrhoids    external   History of radiation therapy 11/29/11 to 01/05/12   anal canal 5040 cGy 28 sessions, regional lymph nodes 4200 cGy 28 sessions   Hyperlipidemia    Rectal bleeding    Vitamin D deficiency    Wears glasses     Family History  Problem Relation Age of Onset   Cancer Sister        breast/ age 67   Cervical cancer Sister        66   Alcohol abuse Maternal Uncle     Past Surgical History:  Procedure Laterality Date   ABDOMINAL HYSTERECTOMY  1978   age 75   BUBBLE  STUDY  12/04/2020   Procedure: BUBBLE STUDY;  Surgeon: [name], MD;  Location: [place];  Service: Cardiology;;   EXAMINATION UNDER ANESTHESIA  06/21/2012   Procedure: EXAM UNDER ANESTHESIA;  Surgeon: [name], MD;  Location: [place];  Service: General;  Laterality: N/A;  rectal exam under anesthesia, anal dilation, rectal biopsy   [place]   Patient had to guess on the date.   RECTAL BIOPSY  06/21/2012   Procedure: BIOPSY RECTAL;  Surgeon: [name], MD;  Location: [place];  Service: General;  Laterality: N/A;   TEE WITHOUT CARDIOVERSION N/A 12/04/2020   Procedure: TRANSESOPHAGEAL ECHOCARDIOGRAM (TEE);  Surgeon: [name], MD;  Location: [place];  Service: Cardiology;  Laterality: N/A;   TOTAL KNEE ARTHROPLASTY Right 06/09/2021   Procedure: RIGHT TOTAL KNEE  ARTHROPLASTY;  Surgeon: Meredith Pel, MD;  Location: [place];  Service: Orthopedics;  Laterality: Right;   TUBAL LIGATION     b/l  age 75   Social History   Occupational History   Not on file  Tobacco Use   Smoking status: Former    Packs/day: 0.50    Years: 25.00    Pack years: 12.50    Types: Cigarettes    Quit date: 09/07/1987    Years since quitting: 34.0   Smokeless tobacco: Never  Vaping Use   Vaping Use: Never used  Substance and Sexual Activity   Alcohol use: No   Drug use: No   Sexual activity: Not Currently

## 2021-09-10 ENCOUNTER — Telehealth: Payer: Self-pay | Admitting: Rehabilitative and Restorative Service Providers"

## 2021-09-10 ENCOUNTER — Encounter: Payer: Self-pay | Admitting: Physical Medicine and Rehabilitation

## 2021-09-10 ENCOUNTER — Encounter: Payer: Medicare HMO | Admitting: Rehabilitative and Restorative Service Providers"

## 2021-09-10 ENCOUNTER — Ambulatory Visit (INDEPENDENT_AMBULATORY_CARE_PROVIDER_SITE_OTHER): Payer: Medicare HMO | Admitting: Physical Medicine and Rehabilitation

## 2021-09-10 ENCOUNTER — Other Ambulatory Visit: Payer: Self-pay

## 2021-09-10 ENCOUNTER — Ambulatory Visit: Payer: Self-pay

## 2021-09-10 VITALS — BP 139/82 | HR 85

## 2021-09-10 DIAGNOSIS — M5416 Radiculopathy, lumbar region: Secondary | ICD-10-CM

## 2021-09-10 DIAGNOSIS — M48062 Spinal stenosis, lumbar region with neurogenic claudication: Secondary | ICD-10-CM

## 2021-09-10 MED ORDER — METHYLPREDNISOLONE ACETATE 80 MG/ML IJ SUSP: 80.0000 mg | Freq: Once | INTRAMUSCULAR | Status: DC

## 2021-09-10 NOTE — Patient Instructions (Signed)

## 2021-09-10 NOTE — Telephone Encounter (Signed)
[name] has had trouble with her R scitica for the last several days.  She has an epidural scheduled this afternoon and forgot her appointment today.  Reminded her of her appointment tomorrow.

## 2021-09-10 NOTE — Progress Notes (Signed)
Pt state lower back pain that travels to her right buttocks. Pt state walking and standing makes the pain worse. Pt state she takes pain meds to help ease her pain.  Numeric Pain Rating Scale and Functional Assessment Average Pain 8   In the last MONTH (on 0-10 scale) has pain interfered with the following?  1. General activity like being  able to carry out your everyday physical activities such as walking, climbing stairs, carrying groceries, or moving a chair?  Rating(10)   +Driver, -BT, -Dye Allergies.

## 2021-09-11 ENCOUNTER — Encounter: Payer: Self-pay | Admitting: Rehabilitative and Restorative Service Providers"

## 2021-09-11 ENCOUNTER — Ambulatory Visit (INDEPENDENT_AMBULATORY_CARE_PROVIDER_SITE_OTHER): Payer: Medicare HMO | Admitting: Rehabilitative and Restorative Service Providers"

## 2021-09-11 DIAGNOSIS — R262 Difficulty in walking, not elsewhere classified: Secondary | ICD-10-CM | POA: Diagnosis not present

## 2021-09-11 DIAGNOSIS — G8929 Other chronic pain: Secondary | ICD-10-CM | POA: Diagnosis not present

## 2021-09-11 DIAGNOSIS — M25661 Stiffness of right knee, not elsewhere classified: Secondary | ICD-10-CM

## 2021-09-11 DIAGNOSIS — R600 Localized edema: Secondary | ICD-10-CM

## 2021-09-11 DIAGNOSIS — M25561 Pain in right knee: Secondary | ICD-10-CM

## 2021-09-11 DIAGNOSIS — M6281 Muscle weakness (generalized): Secondary | ICD-10-CM | POA: Diagnosis not present

## 2021-09-11 NOTE — Patient Instructions (Signed)
Access Code: [email or web address] URL: [email or web address] Date: 09/11/2021 Prepared by: Vista Mink  Exercises Supine Quadricep Sets - 2-3 x daily - 7 x weekly - 2-3 sets - 10 reps - 5 second hold Tandem Stance - 2 x daily - 7 x weekly - 1 sets - 5 reps - 20 second hold Standing Hip Hiking - 2 x daily - 7 x weekly - 1 sets - 10 reps - 3 seconds hold Standing Lumbar Extension at [place] 5 x daily - 7 x weekly - 1 sets - 5 reps - 3 seconds hold

## 2021-09-11 NOTE — Therapy (Signed)
[facility] Physical Therapy [address] [place], [place], [address] Phone: [phone]   Fax:  [phone]  Physical Therapy Treatment  Patient Details  Name: [name]: [id number] Date of Birth: 07-31-1946 Referring Provider (PT): Marcene Duos MD   Encounter Date: 09/11/2021   PT End of Session - 09/11/21 1207     Visit Number 17    Number of Visits 24    Date for PT Re-Evaluation 09/30/21    Authorization Type [facility], an additional 12 visist applied for on 08/18/2021    Authorization Time Period 9/7-11/7, sent for new aurthorization on 09/02/2021 for 12 additional visits    Authorization - Visit Number 17    Authorization - Number of Visits 24    Progress Note Due on Visit 24    PT Start Time 1102    PT Stop Time 1200    PT Time Calculation (min) 58 min    Activity Tolerance Patient tolerated treatment well    Behavior During Therapy [facility] for tasks assessed/performed             Past Medical History:  Diagnosis Date   anal ca dx'd 11/2011   squamous cell carcinoma   Anxiety    Arthritis 2022   Depression    Full dentures    Hemorrhoids    external   History of radiation therapy 11/29/11 to 01/05/12   anal canal 5040 cGy 28 sessions, regional lymph nodes 4200 cGy 28 sessions   Hyperlipidemia    Rectal bleeding    Vitamin D deficiency    Wears glasses     Past Surgical History:  Procedure Laterality Date   ABDOMINAL HYSTERECTOMY  1978   age 49   BUBBLE STUDY  12/04/2020   Procedure: BUBBLE STUDY;  Surgeon: [name], MD;  Location: [place];  Service: Cardiology;;   EXAMINATION UNDER ANESTHESIA  06/21/2012   Procedure: EXAM UNDER ANESTHESIA;  Surgeon: [name], MD;  Location: [place];  Service: General;  Laterality: N/A;  rectal exam under anesthesia, anal dilation, rectal biopsy   [place]   Patient had to guess on the date.   RECTAL BIOPSY  06/21/2012   Procedure: BIOPSY  RECTAL;  Surgeon: [name], MD;  Location: [place];  Service: General;  Laterality: N/A;   TEE WITHOUT CARDIOVERSION N/A 12/04/2020   Procedure: TRANSESOPHAGEAL ECHOCARDIOGRAM (TEE);  Surgeon: [name], MD;  Location: [place];  Service: Cardiology;  Laterality: N/A;   TOTAL KNEE ARTHROPLASTY Right 06/09/2021   Procedure: RIGHT TOTAL KNEE ARTHROPLASTY;  Surgeon: Meredith Pel, MD;  Location: [place];  Service: Orthopedics;  Laterality: Right;   TUBAL LIGATION     b/l  age 64    There were no vitals filed for this visit.   Subjective Assessment - 09/11/21 1203     Subjective Macenzie had an epidural yesterday.  No relief yet.  Low back and R sciatica is more functionally limiting than her R knee, although R leg weakness still limits function.    Pertinent History Hypothyroid, previous cancer/radiation, lumbar spinal stenosis    Limitations House hold activities;Standing;Walking    How long can you sit comfortably? Start-up stiffness after 30 minutes    How long can you stand comfortably? Sciatica limits to 10 minutes (was 30-45)    How long can you walk comfortably? To and from the car, short distances in the neighborhood (limited by sciatica)  Patient Stated Goals Walk better and have less pain    Currently in Pain? Yes    Pain Score 5     Pain Location Leg    Pain Orientation Right    Pain Descriptors / Indicators Burning;Constant;Radiating    Pain Type Acute pain    Pain Radiating Towards Low back to R knee    Pain Onset More than a month ago    Pain Frequency Constant    Aggravating Factors  Flexion and prolonged postures    Pain Relieving Factors Heat and postural correction/change    Effect of Pain on Daily Activities Flexed posture in standing, uses cane with gait, increased sciatica with prolonged postures                               [facility] Adult PT Treatment/Exercise - 09/11/21 0001       Neuro Re-ed    Neuro  Re-ed Details  Feet together eyes closed, tandem stance with various widths with postural cues to avoid flexion      Exercises   Exercises Knee/Hip      Knee/Hip Exercises: Aerobic   Recumbent Bike Seat 6 for 8 minutes      Knee/Hip Exercises: Machines for Strengthening   Total Gym Leg Press 100# B slow eccentrics 20X full extension and 50# single leg 10X slow eccentrics and full extension      Knee/Hip Exercises: Standing   Other Standing Knee Exercises Alternating hip hike 2 sets of 10 for 3 seconds    Other Standing Knee Exercises Standing hip/trunk extension 10X 3 seconds      Knee/Hip Exercises: Seated   Sit to Sand 5 reps;with UE support      Knee/Hip Exercises: Supine   Quad Sets Strengthening;Both;2 sets;10 reps      Vasopneumatic   Number Minutes Vasopneumatic  10 minutes    Vasopnuematic Location  Knee    Vasopneumatic Pressure Medium    Vasopneumatic Temperature  34                     PT Education - 09/11/21 1206     Education Details Reviewed emphasis on postural correction and awareness, R LE (hip abductors and quadriceps strength) and balance    Person(s) Educated Patient    Methods Explanation;Demonstration;Tactile cues;Verbal cues    Comprehension Verbalized understanding;Tactile cues required;Need further instruction;Returned demonstration;Verbal cues required              PT Short Term Goals - 08/10/21 0929       PT SHORT TERM GOAL #1   Title Taisley will have R knee AROM at -2 to 100 degrees or better.    Status Achieved      PT SHORT TERM GOAL #2   Title Bellanie will be independent with her week 1 HEP.    Status Achieved               PT Long Term Goals - 09/02/21 1051       PT LONG TERM GOAL #1   Title Improve FOTO to 59.    Status Achieved      PT LONG TERM GOAL #2   Title Florentine will report R knee pain consistently 0-4/10 on the Numeric Pain Rating Scale.    Status On-going      PT LONG TERM GOAL #3   Title  Improve R knee AROM to 0-110 degrees  Status Achieved      PT LONG TERM GOAL #4   Title Improve R quadriceps strength to at least 50 pounds.    Status On-going      PT LONG TERM GOAL #5   Title Sakina will be independent with her long-term HEP at DC.    Status On-going                   Plan - 09/11/21 1208     Clinical Impression Statement Harika has good R knee AROM.  Quadriceps strength is improving but is limited by R sciatica.  Low back and R leg pain is affecting her gait and function and has been an increasing focus of her post-TKA rehabilitation.  We have added general R leg strengthening (quadriceps and hip abductors) along with postural correction to meet long-term goals and avoid flaring-up her back and scaitica.  We have also addressed posture and body mechanics to keep Maghen out of trouble and allow her to fully participate with her post-TKA physical therapy.    Personal Factors and Comorbidities Comorbidity 3+    Comorbidities Hypothyroid, previous cancer/radiation, lumbar spinal stenosis    Examination-Activity Limitations Bathing;Dressing;Bed Mobility;Hygiene/Grooming;Sleep;Squat;Lift;Bend;Locomotion Level;Stairs;Stand    Examination-Participation Restrictions Occupation;Cleaning;Laundry;Community Activity;Driving    Stability/Clinical Decision Making Stable/Uncomplicated    Clinical Decision Making Low    Rehab Potential Good    PT Frequency 2x / week    PT Duration Other (comment)   RA in 2 weeks   PT Treatment/Interventions ADLs/Self Care Home Management;Electrical Stimulation;Cryotherapy;Therapeutic activities;Stair training;Gait training;Therapeutic exercise;Neuromuscular re-education;Patient/family education;Manual techniques;Vasopneumatic Device    PT Next Visit Plan Postural correction (forward trunk flexion) work on balance,  quadriceps, core and hip abductors strength    PT Home Exercise Plan [phone]    Consulted and Agree with Plan of Care Patient              Patient will benefit from skilled therapeutic intervention in order to improve the following deficits and impairments:  Abnormal gait, Decreased activity tolerance, Decreased endurance, Decreased range of motion, Decreased strength, Difficulty walking, Increased edema, Impaired flexibility, Pain  Visit Diagnosis: Difficulty walking  Muscle weakness (generalized)  Localized edema  Stiffness of right knee, not elsewhere classified  Chronic pain of right knee     Problem List Patient Active Problem List   Diagnosis Date Noted   Arthritis of right knee    UTI (urinary tract infection) 06/26/2021   Severe sepsis ([place]) 81/19/1478   Acute metabolic encephalopathy 29/56/2130   Hypothyroid 06/25/2021   S/P total knee arthroplasty, right 06/09/2021   Fever    Staphylococcus aureus bacteremia with sepsis (HCC)    SIRS (systemic inflammatory response syndrome) ([place]) 11/30/2020   Lumbar radiculopathy 10/10/2019   Bilateral stenosis of lateral recess of lumbar spine 10/10/2019   Hemorrhoids, external without complications 86/57/8469   Depression    Anxiety    Rectal bleeding    History of radiation therapy    Malignant neoplasm of anal canal ([place]) 11/15/2011   Hemorrhoids, internal, with bleeding 09/07/2011   Acquired anal stenosis 09/07/2011   Hyperlipidemia 09/07/2011   Family history of breast cancer in sister 09/07/2011    Farley Ly, PT, MPT 09/11/2021, 12:15 PM  [facility] [address] [place], [place], [address] Phone: [phone]   Fax:  [phone]  Name: ROZENA FIERRO MRN: [id number] Date of Birth: August 17, 1946

## 2021-09-14 ENCOUNTER — Ambulatory Visit (INDEPENDENT_AMBULATORY_CARE_PROVIDER_SITE_OTHER): Payer: Medicare HMO | Admitting: Physical Therapy

## 2021-09-14 ENCOUNTER — Encounter: Payer: Self-pay | Admitting: Physical Therapy

## 2021-09-14 ENCOUNTER — Other Ambulatory Visit: Payer: Self-pay

## 2021-09-14 DIAGNOSIS — G8929 Other chronic pain: Secondary | ICD-10-CM

## 2021-09-14 DIAGNOSIS — M25561 Pain in right knee: Secondary | ICD-10-CM | POA: Diagnosis not present

## 2021-09-14 DIAGNOSIS — R600 Localized edema: Secondary | ICD-10-CM | POA: Diagnosis not present

## 2021-09-14 DIAGNOSIS — M25661 Stiffness of right knee, not elsewhere classified: Secondary | ICD-10-CM

## 2021-09-14 DIAGNOSIS — R262 Difficulty in walking, not elsewhere classified: Secondary | ICD-10-CM

## 2021-09-14 DIAGNOSIS — M6281 Muscle weakness (generalized): Secondary | ICD-10-CM | POA: Diagnosis not present

## 2021-09-14 NOTE — Therapy (Signed)
[facility] Physical Therapy [address] [place], [place], [address] Phone: [phone]   Fax:  [phone]  Physical Therapy Treatment  Patient Details  Name: [name]: [id number] Date of Birth: 11-27-1945 Referring Provider (PT): Tiffany Duos MD   Encounter Date: 09/14/2021   PT End of Session - 09/14/21 1311     Visit Number 18    Number of Visits 24    Date for PT Re-Evaluation 09/30/21    Authorization Type [facility], an additional 12 visist applied for on 08/18/2021    Authorization Time Period 9/7-11/7, sent for new aurthorization on 09/02/2021 for 12 additional visits    Authorization - Visit Number 18    Authorization - Number of Visits 24    Progress Note Due on Visit 24    PT Start Time 1302    PT Stop Time 1856    PT Time Calculation (min) 45 min    Activity Tolerance Patient tolerated treatment well    Behavior During Therapy [name] for tasks assessed/performed             Past Medical History:  Diagnosis Date   anal ca dx'd 11/2011   squamous cell carcinoma   Anxiety    Arthritis 2022   Depression    Full dentures    Hemorrhoids    external   History of radiation therapy 11/29/11 to 01/05/12   anal canal 5040 cGy 28 sessions, regional lymph nodes 4200 cGy 28 sessions   Hyperlipidemia    Rectal bleeding    Vitamin D deficiency    Wears glasses     Past Surgical History:  Procedure Laterality Date   ABDOMINAL HYSTERECTOMY  1978   age 86   BUBBLE STUDY  12/04/2020   Procedure: BUBBLE STUDY;  Surgeon: [name], MD;  Location: [place];  Service: Cardiology;;   EXAMINATION UNDER ANESTHESIA  06/21/2012   Procedure: EXAM UNDER ANESTHESIA;  Surgeon: [name], MD;  Location: [place];  Service: General;  Laterality: N/A;  rectal exam under anesthesia, anal dilation, rectal biopsy   [place]   Patient had to guess on the date.   RECTAL BIOPSY  06/21/2012   Procedure: BIOPSY  RECTAL;  Surgeon: [name], MD;  Location: [place];  Service: General;  Laterality: N/A;   TEE WITHOUT CARDIOVERSION N/A 12/04/2020   Procedure: TRANSESOPHAGEAL ECHOCARDIOGRAM (TEE);  Surgeon: [name], MD;  Location: [place];  Service: Cardiology;  Laterality: N/A;   TOTAL KNEE ARTHROPLASTY Right 06/09/2021   Procedure: RIGHT TOTAL KNEE ARTHROPLASTY;  Surgeon: Meredith Pel, MD;  Location: [place];  Service: Orthopedics;  Laterality: Right;   TUBAL LIGATION     b/l  age 65    There were no vitals filed for this visit.   Subjective Assessment - 09/14/21 1306     Subjective Pt arriving to therapy stating she fell last week as she was leaving the clinic following her epidural injection on Thursday 09/20/2021. Pt reporting increased knee pain of 4/10 right knee.    Pertinent History Hypothyroid, previous cancer/radiation, lumbar spinal stenosis    Limitations House hold activities;Standing;Walking    How long can you sit comfortably? Start-up stiffness after 30 minutes    How long can you stand comfortably? Sciatica limits to 10 minutes (was 30-45)    How long can you walk comfortably? To and from the car, short distances in the neighborhood (limited by sciatica)  Patient Stated Goals Walk better and have less pain    Currently in Pain? Yes    Pain Score 4     Pain Orientation Right    Pain Descriptors / Indicators Aching    Pain Type Acute pain    Pain Onset More than a month ago                [name] PT Assessment - 09/14/21 0001       Assessment   Medical Diagnosis right TKA    Referring Provider (PT) Tiffany Duos MD                           [facility] Adult PT Treatment/Exercise - 09/14/21 0001       Neuro Re-ed    Neuro Re-ed Details  standing marching on Airex mat with single UE support, heel raises on Airex, side stepping, tandum walking, backward walking all with CGA for balance      Exercises   Exercises  Knee/Hip      Knee/Hip Exercises: Aerobic   Recumbent Bike Seat 6 for 8 minutes      Knee/Hip Exercises: Machines for Strengthening   Total Gym Leg Press 100# B slow eccentrics 20X full extension and 50# single leg 10X slow eccentrics and full extension      Knee/Hip Exercises: Standing   Forward Step Up 15 reps;Hand Hold: 1;Step Height: 6"    Forward Step Up Limitations instructions to prevent forward leaning.      Vasopneumatic   Number Minutes Vasopneumatic  15 minutes    Vasopnuematic Location  Knee    Vasopneumatic Pressure Medium    Vasopneumatic Temperature  34                     PT Education - 09/14/21 1338     Education Details Discussed continued ice and elvation for increased rigth LE edema since fall last week.    Person(s) Educated Patient    Methods Explanation    Comprehension Verbalized understanding              PT Short Term Goals - 09/14/21 1319       PT SHORT TERM GOAL #1   Title [name] will have R knee AROM at -2 to 100 degrees or better.    Status Achieved      PT SHORT TERM GOAL #2   Title [name] will be independent with her week 1 HEP.    Status Achieved               PT Long Term Goals - 09/14/21 1320       PT LONG TERM GOAL #1   Title Improve FOTO to 59.    Status Achieved      PT LONG TERM GOAL #2   Title [name] will report R knee pain consistently 0-4/10 on the Numeric Pain Rating Scale.    Status On-going      PT LONG TERM GOAL #3   Title Improve R knee AROM to 0-110 degrees    Status Achieved      PT LONG TERM GOAL #4   Title Improve R quadriceps strength to at least 50 pounds.    Baseline L is 35.3 pounds and R 41.2 pounds (Goal is 50+ pounds B)    Status On-going      PT LONG TERM GOAL #5   Title [name] will be independent with her  long-term HEP at DC.    Status On-going                   Plan - 09/14/21 1312     Clinical Impression Statement Pt arriving today reporting fall last  thursday in the parking lot as she was leaving the clinic after her epidural. Pt reporting 4/10 right knee pain with mild antalgic gait noted. Pt using her straight cane for communtiy level amb. Pt amb with no device on level surfaces. Focused on LE strengtheing and general posture control during TKA exercises and functional mobility. Continue skilled PT to maximize function.    Personal Factors and Comorbidities Comorbidity 3+    Comorbidities Hypothyroid, previous cancer/radiation, lumbar spinal stenosis    Examination-Activity Limitations Bathing;Dressing;Bed Mobility;Hygiene/Grooming;Sleep;Squat;Lift;Bend;Locomotion Level;Stairs;Stand    Examination-Participation Restrictions Occupation;Cleaning;Laundry;Community Activity;Driving    Stability/Clinical Decision Making Stable/Uncomplicated    Rehab Potential Good    PT Frequency 2x / week    PT Duration Other (comment)    PT Treatment/Interventions ADLs/Self Care Home Management;Electrical Stimulation;Cryotherapy;Therapeutic activities;Stair training;Gait training;Therapeutic exercise;Neuromuscular re-education;Patient/family education;Manual techniques;Vasopneumatic Device    PT Next Visit Plan dynamic balance,  quadriceps, core and hip abductors strength    PT Home Exercise Plan [phone]    Consulted and Agree with Plan of Care Patient             Patient will benefit from skilled therapeutic intervention in order to improve the following deficits and impairments:  Abnormal gait, Decreased activity tolerance, Decreased endurance, Decreased range of motion, Decreased strength, Difficulty walking, Increased edema, Impaired flexibility, Pain  Visit Diagnosis: Difficulty walking  Muscle weakness (generalized)  Localized edema  Stiffness of right knee, not elsewhere classified  Chronic pain of right knee     Problem List Patient Active Problem List   Diagnosis Date Noted   Arthritis of right knee    UTI (urinary tract  infection) 06/26/2021   Severe sepsis ([place]) 00/86/7619   Acute metabolic encephalopathy 50/93/2671   Hypothyroid 06/25/2021   S/P total knee arthroplasty, right 06/09/2021   Fever    Staphylococcus aureus bacteremia with sepsis (HCC)    SIRS (systemic inflammatory response syndrome) ([place]) 11/30/2020   Lumbar radiculopathy 10/10/2019   Bilateral stenosis of lateral recess of lumbar spine 10/10/2019   Hemorrhoids, external without complications 24/58/0998   Depression    Anxiety    Rectal bleeding    History of radiation therapy    Malignant neoplasm of anal canal ([place]) 11/15/2011   Hemorrhoids, internal, with bleeding 09/07/2011   Acquired anal stenosis 09/07/2011   Hyperlipidemia 09/07/2011   Family history of breast cancer in sister 09/07/2011    Oretha Caprice, PT, MPT 09/14/2021, 1:39 PM  [facility] Physical Therapy [address] [place], [place], [address] Phone: [phone]   Fax:  [phone]  Name: MEDIA PIZZINI MRN: [id number] Date of Birth: 17-Oct-1946

## 2021-09-15 DIAGNOSIS — F411 Generalized anxiety disorder: Secondary | ICD-10-CM | POA: Diagnosis not present

## 2021-09-15 DIAGNOSIS — F331 Major depressive disorder, recurrent, moderate: Secondary | ICD-10-CM | POA: Diagnosis not present

## 2021-09-16 ENCOUNTER — Other Ambulatory Visit: Payer: Self-pay

## 2021-09-16 ENCOUNTER — Ambulatory Visit (INDEPENDENT_AMBULATORY_CARE_PROVIDER_SITE_OTHER): Payer: Medicare HMO | Admitting: Physical Therapy

## 2021-09-16 ENCOUNTER — Encounter: Payer: Self-pay | Admitting: Physical Therapy

## 2021-09-16 DIAGNOSIS — R600 Localized edema: Secondary | ICD-10-CM

## 2021-09-16 DIAGNOSIS — R262 Difficulty in walking, not elsewhere classified: Secondary | ICD-10-CM

## 2021-09-16 DIAGNOSIS — M25661 Stiffness of right knee, not elsewhere classified: Secondary | ICD-10-CM | POA: Diagnosis not present

## 2021-09-16 DIAGNOSIS — G8929 Other chronic pain: Secondary | ICD-10-CM

## 2021-09-16 DIAGNOSIS — M6281 Muscle weakness (generalized): Secondary | ICD-10-CM | POA: Diagnosis not present

## 2021-09-16 DIAGNOSIS — M25561 Pain in right knee: Secondary | ICD-10-CM | POA: Diagnosis not present

## 2021-09-16 NOTE — Therapy (Addendum)
[facility] Physical Therapy [address] [place], [place], [address] Phone: [phone]   Fax:  [phone]  Physical Therapy Treatment  Patient Details  Name: [name]: [id number] Date of Birth: 1946-08-26 Referring Provider (PT): Marcene Duos MD  PHYSICAL THERAPY DISCHARGE SUMMARY  Visits from Start of Care: 19  Current functional level related to goals / functional outcomes: See note   Remaining deficits: See note   Education / Equipment: HEP   Patient agrees to discharge. Patient goals were partially met. Patient is being discharged due to not returning since the last visit.   Encounter Date: 09/16/2021   PT End of Session - 09/16/21 1115     Visit Number 19    Number of Visits 24    Date for PT Re-Evaluation 09/30/21    Authorization Type [employer] M[employer], an additional 12 visist applied for on 08/18/2021    Authorization Time Period 9/7-11/7, sent for new aurthorization on 09/02/2021 for 12 additional visits    Authorization - Visit Number 19    Authorization - Number of Visits 24    PT Start Time 1100    PT Stop Time 1143    PT Time Calculation (min) 43 min    Activity Tolerance Patient tolerated treatment well    Behavior During Therapy [facility] for tasks assessed/performed             Past Medical History:  Diagnosis Date   anal ca dx'd 11/2011   squamous cell carcinoma   Anxiety    Arthritis 2022   Depression    Full dentures    Hemorrhoids    external   History of radiation therapy 11/29/11 to 01/05/12   anal canal 5040 cGy 28 sessions, regional lymph nodes 4200 cGy 28 sessions   Hyperlipidemia    Rectal bleeding    Vitamin D deficiency    Wears glasses     Past Surgical History:  Procedure Laterality Date   ABDOMINAL HYSTERECTOMY  1978   age 16   BUBBLE STUDY  12/04/2020   Procedure: BUBBLE STUDY;  Surgeon: [name], MD;  Location: [place];  Service: Cardiology;;   EXAMINATION UNDER ANESTHESIA  06/21/2012    Procedure: EXAM UNDER ANESTHESIA;  Surgeon: [name], MD;  Location: [place];  Service: General;  Laterality: N/A;  rectal exam under anesthesia, anal dilation, rectal biopsy   [place]   Patient had to guess on the date.   RECTAL BIOPSY  06/21/2012   Procedure: BIOPSY RECTAL;  Surgeon: [name], MD;  Location: [place];  Service: General;  Laterality: N/A;   TEE WITHOUT CARDIOVERSION N/A 12/04/2020   Procedure: TRANSESOPHAGEAL ECHOCARDIOGRAM (TEE);  Surgeon: [name], MD;  Location: [name];  Service: Cardiology;  Laterality: N/A;   TOTAL KNEE ARTHROPLASTY Right 06/09/2021   Procedure: RIGHT TOTAL KNEE ARTHROPLASTY;  Surgeon: Meredith Pel, MD;  Location: [place];  Service: Orthopedics;  Laterality: Right;   TUBAL LIGATION     b/l  age 72    There were no vitals filed for this visit.   Subjective Assessment - 09/16/21 1113     Subjective Pt arriving today reproting 4/10 pain in her rigth knee.    Pertinent History Hypothyroid, previous cancer/radiation, lumbar spinal stenosis    Limitations House hold activities;Standing;Walking    How long can you stand comfortably? Sciatica limits to 10 minutes (was 30-45)    How long can you walk  comfortably? To and from the car, short distances in the neighborhood (limited by sciatica)    Patient Stated Goals Walk better and have less pain    Currently in Pain? Yes    Pain Score 4     Pain Location Knee    Pain Orientation Right    Pain Descriptors / Indicators Aching    Pain Type Acute pain    Pain Onset More than a month ago                [facility] PT Assessment - 09/16/21 0001       Assessment   Medical Diagnosis right TKA    Referring Provider (PT) Marcene Duos MD      AROM   Right Knee Extension -2    Right Knee Flexion 116                           [facility] Adult PT Treatment/Exercise - 09/16/21 0001       Neuro Re-ed     Neuro Re-ed Details  side stepping, TUG with average score of 17 seconds and best score of 15 seconds using no device. Standing on Airex: heel lifts and toe raises with UE support      Exercises   Exercises Knee/Hip      Knee/Hip Exercises: Stretches   Active Hamstring Stretch Right;2 reps;30 seconds    Active Hamstring Stretch Limitations seated      Knee/Hip Exercises: Aerobic   Recumbent Bike seat 6 for 8 minutes Level 4      Knee/Hip Exercises: Machines for Strengthening   Total Gym Leg Press 100# B slow eccentrics 20X full extension and 50# single leg 10X slow eccentrics and full extension      Knee/Hip Exercises: Standing   Forward Step Up 15 reps;Hand Hold: 1;Step Height: 6"    Forward Step Up Limitations instructions to prevent forward leaning.    Other Standing Knee Exercises hip hike on 4 inch step with UE support x 10                     PT Education - 09/16/21 1114     Education Details gait training with cane in her left UE to support her right knee.    Person(s) Educated Patient    Methods Explanation;Demonstration;Verbal cues;Tactile cues    Comprehension Verbalized understanding;Returned demonstration;Need further instruction              PT Short Term Goals - 09/14/21 1319       PT SHORT TERM GOAL #1   Title [name] will have R knee AROM at -2 to 100 degrees or better.    Status Achieved      PT SHORT TERM GOAL #2   Title [name] will be independent with her week 1 HEP.    Status Achieved               PT Long Term Goals - 09/16/21 1120       PT LONG TERM GOAL #1   Title Improve FOTO to 59.    Status Achieved      PT LONG TERM GOAL #2   Title [name] will report R knee pain consistently 0-4/10 on the Numeric Pain Rating Scale.    Baseline 3-4/10 pain reported today    Status On-going      PT LONG TERM GOAL #3   Title Improve R knee AROM to 0-110  degrees    Status Achieved      PT LONG TERM GOAL #4   Title Improve R  quadriceps strength to at least 50 pounds.    Status On-going      PT LONG TERM GOAL #5   Title [name] will be independent with her long-term HEP at DC.    Status On-going                   Plan - 09/16/21 1116     Clinical Impression Statement Pt reproting 3-4/10 pain in her right knee today upon arrival. Gait training performed for quad tip cane technique and sequencing with cane in pt's left UE. Pt tolerating strengthening exercises well. Conitnue skilled PT to maximize pt's function.    Comorbidities Hypothyroid, previous cancer/radiation, lumbar spinal stenosis    Examination-Activity Limitations Bathing;Dressing;Bed Mobility;Hygiene/Grooming;Sleep;Squat;Lift;Bend;Locomotion Level;Stairs;Stand    Examination-Participation Restrictions Occupation;Cleaning;Laundry;Community Activity;Driving    Stability/Clinical Decision Making Stable/Uncomplicated    Rehab Potential Good    PT Frequency 2x / week    PT Duration Other (comment)    PT Treatment/Interventions ADLs/Self Care Home Management;Electrical Stimulation;Cryotherapy;Therapeutic activities;Stair training;Gait training;Therapeutic exercise;Neuromuscular re-education;Patient/family education;Manual techniques;Vasopneumatic Device    PT Next Visit Plan dynamic balance,  quadriceps, core and hip abductors strength    PT Home Exercise Plan [phone]    Consulted and Agree with Plan of Care Patient             Patient will benefit from skilled therapeutic intervention in order to improve the following deficits and impairments:  Abnormal gait, Decreased activity tolerance, Decreased endurance, Decreased range of motion, Decreased strength, Difficulty walking, Increased edema, Impaired flexibility, Pain  Visit Diagnosis: Difficulty walking  Muscle weakness (generalized)  Localized edema  Stiffness of right knee, not elsewhere classified  Chronic pain of right knee     Problem List Patient Active Problem List    Diagnosis Date Noted   Arthritis of right knee    UTI (urinary tract infection) 06/26/2021   Severe sepsis (HCC) 73/53/2992   Acute metabolic encephalopathy 42/68/3419   Hypothyroid 06/25/2021   S/P total knee arthroplasty, right 06/09/2021   Fever    Staphylococcus aureus bacteremia with sepsis (HCC)    SIRS (systemic inflammatory response syndrome) ([place]) 11/30/2020   Lumbar radiculopathy 10/10/2019   Bilateral stenosis of lateral recess of lumbar spine 10/10/2019   Hemorrhoids, external without complications 62/22/9798   Depression    Anxiety    Rectal bleeding    History of radiation therapy    Malignant neoplasm of anal canal ([place]) 11/15/2011   Hemorrhoids, internal, with bleeding 09/07/2011   Acquired anal stenosis 09/07/2011   Hyperlipidemia 09/07/2011   Family history of breast cancer in sister 09/07/2011    Oretha Caprice, PT, MPT 09/16/2021, 11:57 AM  Farley Ly PT, MPT  [facility] Therapy [address] [place], [place], [address] Phone: [phone]   Fax:  [phone]  Name: [name]: [id number] Date of Birth: [birth year]

## 2021-09-19 ENCOUNTER — Emergency Department (HOSPITAL_COMMUNITY): Payer: Medicare HMO

## 2021-09-19 ENCOUNTER — Inpatient Hospital Stay (HOSPITAL_COMMUNITY)
Admission: EM | Admit: 2021-09-19 | Discharge: 2021-09-30 | DRG: 871 | Disposition: A | Discharge status: Skilled Nursing Facility | Payer: Medicare HMO | Attending: Internal Medicine | Admitting: Internal Medicine

## 2021-09-19 ENCOUNTER — Encounter (HOSPITAL_COMMUNITY): Payer: Self-pay | Admitting: Emergency Medicine

## 2021-09-19 DIAGNOSIS — M1990 Unspecified osteoarthritis, unspecified site: Secondary | ICD-10-CM | POA: Diagnosis present

## 2021-09-19 DIAGNOSIS — X58XXXA Exposure to other specified factors, initial encounter: Secondary | ICD-10-CM | POA: Diagnosis present

## 2021-09-19 DIAGNOSIS — R6520 Severe sepsis without septic shock: Secondary | ICD-10-CM | POA: Diagnosis present

## 2021-09-19 DIAGNOSIS — A419 Sepsis, unspecified organism: Secondary | ICD-10-CM | POA: Diagnosis present

## 2021-09-19 DIAGNOSIS — Z87891 Personal history of nicotine dependence: Secondary | ICD-10-CM

## 2021-09-19 DIAGNOSIS — Z20822 Contact with and (suspected) exposure to covid-19: Secondary | ICD-10-CM | POA: Diagnosis not present

## 2021-09-19 DIAGNOSIS — E039 Hypothyroidism, unspecified: Secondary | ICD-10-CM | POA: Diagnosis present

## 2021-09-19 DIAGNOSIS — Z90710 Acquired absence of both cervix and uterus: Secondary | ICD-10-CM

## 2021-09-19 DIAGNOSIS — Z1611 Resistance to penicillins: Secondary | ICD-10-CM | POA: Diagnosis present

## 2021-09-19 DIAGNOSIS — R5381 Other malaise: Secondary | ICD-10-CM | POA: Diagnosis present

## 2021-09-19 DIAGNOSIS — R531 Weakness: Secondary | ICD-10-CM | POA: Diagnosis not present

## 2021-09-19 DIAGNOSIS — E785 Hyperlipidemia, unspecified: Secondary | ICD-10-CM | POA: Diagnosis present

## 2021-09-19 DIAGNOSIS — Z743 Need for continuous supervision: Secondary | ICD-10-CM | POA: Diagnosis not present

## 2021-09-19 DIAGNOSIS — Z79890 Hormone replacement therapy (postmenopausal): Secondary | ICD-10-CM

## 2021-09-19 DIAGNOSIS — G9341 Metabolic encephalopathy: Secondary | ICD-10-CM | POA: Diagnosis not present

## 2021-09-19 DIAGNOSIS — I10 Essential (primary) hypertension: Secondary | ICD-10-CM | POA: Diagnosis not present

## 2021-09-19 DIAGNOSIS — R404 Transient alteration of awareness: Secondary | ICD-10-CM | POA: Diagnosis not present

## 2021-09-19 DIAGNOSIS — Z803 Family history of malignant neoplasm of breast: Secondary | ICD-10-CM

## 2021-09-19 DIAGNOSIS — R000 Tachycardia, unspecified: Secondary | ICD-10-CM | POA: Diagnosis not present

## 2021-09-19 DIAGNOSIS — B9620 Unspecified Escherichia coli [E. coli] as the cause of diseases classified elsewhere: Secondary | ICD-10-CM | POA: Diagnosis not present

## 2021-09-19 DIAGNOSIS — F32A Depression, unspecified: Secondary | ICD-10-CM | POA: Diagnosis not present

## 2021-09-19 DIAGNOSIS — N390 Urinary tract infection, site not specified: Secondary | ICD-10-CM

## 2021-09-19 DIAGNOSIS — E876 Hypokalemia: Secondary | ICD-10-CM | POA: Diagnosis present

## 2021-09-19 DIAGNOSIS — Z85048 Personal history of other malignant neoplasm of rectum, rectosigmoid junction, and anus: Secondary | ICD-10-CM

## 2021-09-19 DIAGNOSIS — Z8049 Family history of malignant neoplasm of other genital organs: Secondary | ICD-10-CM | POA: Diagnosis not present

## 2021-09-19 DIAGNOSIS — R4182 Altered mental status, unspecified: Secondary | ICD-10-CM

## 2021-09-19 DIAGNOSIS — R9431 Abnormal electrocardiogram [ECG] [EKG]: Secondary | ICD-10-CM | POA: Diagnosis present

## 2021-09-19 DIAGNOSIS — Z0389 Encounter for observation for other suspected diseases and conditions ruled out: Secondary | ICD-10-CM | POA: Diagnosis not present

## 2021-09-19 DIAGNOSIS — S99922A Unspecified injury of left foot, initial encounter: Secondary | ICD-10-CM | POA: Diagnosis not present

## 2021-09-19 DIAGNOSIS — E860 Dehydration: Secondary | ICD-10-CM | POA: Diagnosis present

## 2021-09-19 DIAGNOSIS — E872 Acidosis, unspecified: Secondary | ICD-10-CM | POA: Diagnosis present

## 2021-09-19 DIAGNOSIS — D72819 Decreased white blood cell count, unspecified: Secondary | ICD-10-CM | POA: Diagnosis not present

## 2021-09-19 DIAGNOSIS — Z7982 Long term (current) use of aspirin: Secondary | ICD-10-CM

## 2021-09-19 DIAGNOSIS — R0902 Hypoxemia: Secondary | ICD-10-CM | POA: Diagnosis not present

## 2021-09-19 DIAGNOSIS — R7881 Bacteremia: Secondary | ICD-10-CM | POA: Diagnosis not present

## 2021-09-19 DIAGNOSIS — Z96651 Presence of right artificial knee joint: Secondary | ICD-10-CM | POA: Diagnosis present

## 2021-09-19 DIAGNOSIS — A4151 Sepsis due to Escherichia coli [E. coli]: Principal | ICD-10-CM | POA: Diagnosis present

## 2021-09-19 DIAGNOSIS — F419 Anxiety disorder, unspecified: Secondary | ICD-10-CM | POA: Diagnosis not present

## 2021-09-19 DIAGNOSIS — R0689 Other abnormalities of breathing: Secondary | ICD-10-CM | POA: Diagnosis not present

## 2021-09-19 DIAGNOSIS — I700 Atherosclerosis of aorta: Secondary | ICD-10-CM | POA: Diagnosis not present

## 2021-09-19 DIAGNOSIS — Z751 Person awaiting admission to adequate facility elsewhere: Secondary | ICD-10-CM

## 2021-09-19 DIAGNOSIS — Z923 Personal history of irradiation: Secondary | ICD-10-CM

## 2021-09-19 DIAGNOSIS — Z79899 Other long term (current) drug therapy: Secondary | ICD-10-CM | POA: Diagnosis not present

## 2021-09-19 HISTORY — DX: Sepsis due to methicillin susceptible Staphylococcus aureus: A41.01

## 2021-09-19 LAB — CBC WITH DIFFERENTIAL/PLATELET
Abs Immature Granulocytes: 0.01 10*3/uL (ref 0.00–0.07)
Basophils Absolute: 0 10*3/uL (ref 0.0–0.1)
Basophils Relative: 1 %
Eosinophils Absolute: 0 10*3/uL (ref 0.0–0.5)
Eosinophils Relative: 0 %
HCT: 38.1 % (ref 36.0–46.0)
Hemoglobin: 12.4 g/dL (ref 12.0–15.0)
Immature Granulocytes: 1 %
Lymphocytes Relative: 16 %
Lymphs Abs: 0.2 10*3/uL — ABNORMAL LOW (ref 0.7–4.0)
MCH: 31.3 pg (ref 26.0–34.0)
MCHC: 32.5 g/dL (ref 30.0–36.0)
MCV: 96.2 fL (ref 80.0–100.0)
Monocytes Absolute: 0 10*3/uL — ABNORMAL LOW (ref 0.1–1.0)
Monocytes Relative: 1 %
Neutro Abs: 0.9 10*3/uL — ABNORMAL LOW (ref 1.7–7.7)
Neutrophils Relative %: 81 %
Platelets: 152 10*3/uL (ref 150–400)
RBC: 3.96 MIL/uL (ref 3.87–5.11)
RDW: 15.2 % (ref 11.5–15.5)
WBC: 1.1 10*3/uL — CL (ref 4.0–10.5)
nRBC: 0 % (ref 0.0–0.2)

## 2021-09-19 LAB — RESP PANEL BY RT-PCR (FLU A&B, COVID) ARPGX2
Influenza A by PCR: NEGATIVE
Influenza B by PCR: NEGATIVE
SARS Coronavirus 2 by RT PCR: NEGATIVE

## 2021-09-19 LAB — URINALYSIS, ROUTINE W REFLEX MICROSCOPIC
Bilirubin Urine: NEGATIVE
Glucose, UA: NEGATIVE mg/dL
Ketones, ur: NEGATIVE mg/dL
Nitrite: NEGATIVE
Protein, ur: 30 mg/dL — AB
Specific Gravity, Urine: 1.011 (ref 1.005–1.030)
WBC, UA: >50 WBC/hpf — ABNORMAL HIGH (ref 0–5)
pH: 6 (ref 5.0–8.0)

## 2021-09-19 LAB — BLOOD GAS, VENOUS
Acid-Base Excess: 0 mmol/L (ref 0.0–2.0)
Bicarbonate: 25.1 mmol/L (ref 20.0–28.0)
O2 Saturation: 78.2 %
Patient temperature: 98.6
pCO2, Ven: 45 mmHg (ref 44.0–60.0)
pH, Ven: 7.365 (ref 7.250–7.430)
pO2, Ven: 48.6 mmHg — ABNORMAL HIGH (ref 32.0–45.0)

## 2021-09-19 LAB — LACTIC ACID, PLASMA
Lactic Acid, Venous: 3.7 mmol/L (ref 0.5–1.9)
Lactic Acid, Venous: 4 mmol/L (ref 0.5–1.9)
Lactic Acid, Venous: 4 mmol/L (ref 0.5–1.9)
Lactic Acid, Venous: 6.1 mmol/L (ref 0.5–1.9)

## 2021-09-19 LAB — COMPREHENSIVE METABOLIC PANEL
ALT: 23 U/L (ref 0–44)
AST: 36 U/L (ref 15–41)
Albumin: 3.9 g/dL (ref 3.5–5.0)
Alkaline Phosphatase: 113 U/L (ref 38–126)
Anion gap: 11 (ref 5–15)
BUN: 16 mg/dL (ref 8–23)
CO2: 25 mmol/L (ref 22–32)
Calcium: 9 mg/dL (ref 8.9–10.3)
Chloride: 103 mmol/L (ref 98–111)
Creatinine, Ser: 0.95 mg/dL (ref 0.44–1.00)
GFR, Estimated: >60 mL/min (ref >60)
Glucose, Bld: 101 mg/dL — ABNORMAL HIGH (ref 70–99)
Potassium: 3.2 mmol/L — ABNORMAL LOW (ref 3.5–5.1)
Sodium: 139 mmol/L (ref 135–145)
Total Bilirubin: 1.2 mg/dL (ref 0.3–1.2)
Total Protein: 7.2 g/dL (ref 6.5–8.1)

## 2021-09-19 LAB — MRSA NEXT GEN BY PCR, NASAL: MRSA by PCR Next Gen: NOT DETECTED

## 2021-09-19 IMAGING — DX DG CHEST 1V PORT
1 series · 1 of 1 positions shown · non-contrast
Comparison: Chest x-ray [DATE]

CLINICAL DATA: Possible sepsis

EXAM:
PORTABLE CHEST 1 VIEW

[chest ap]
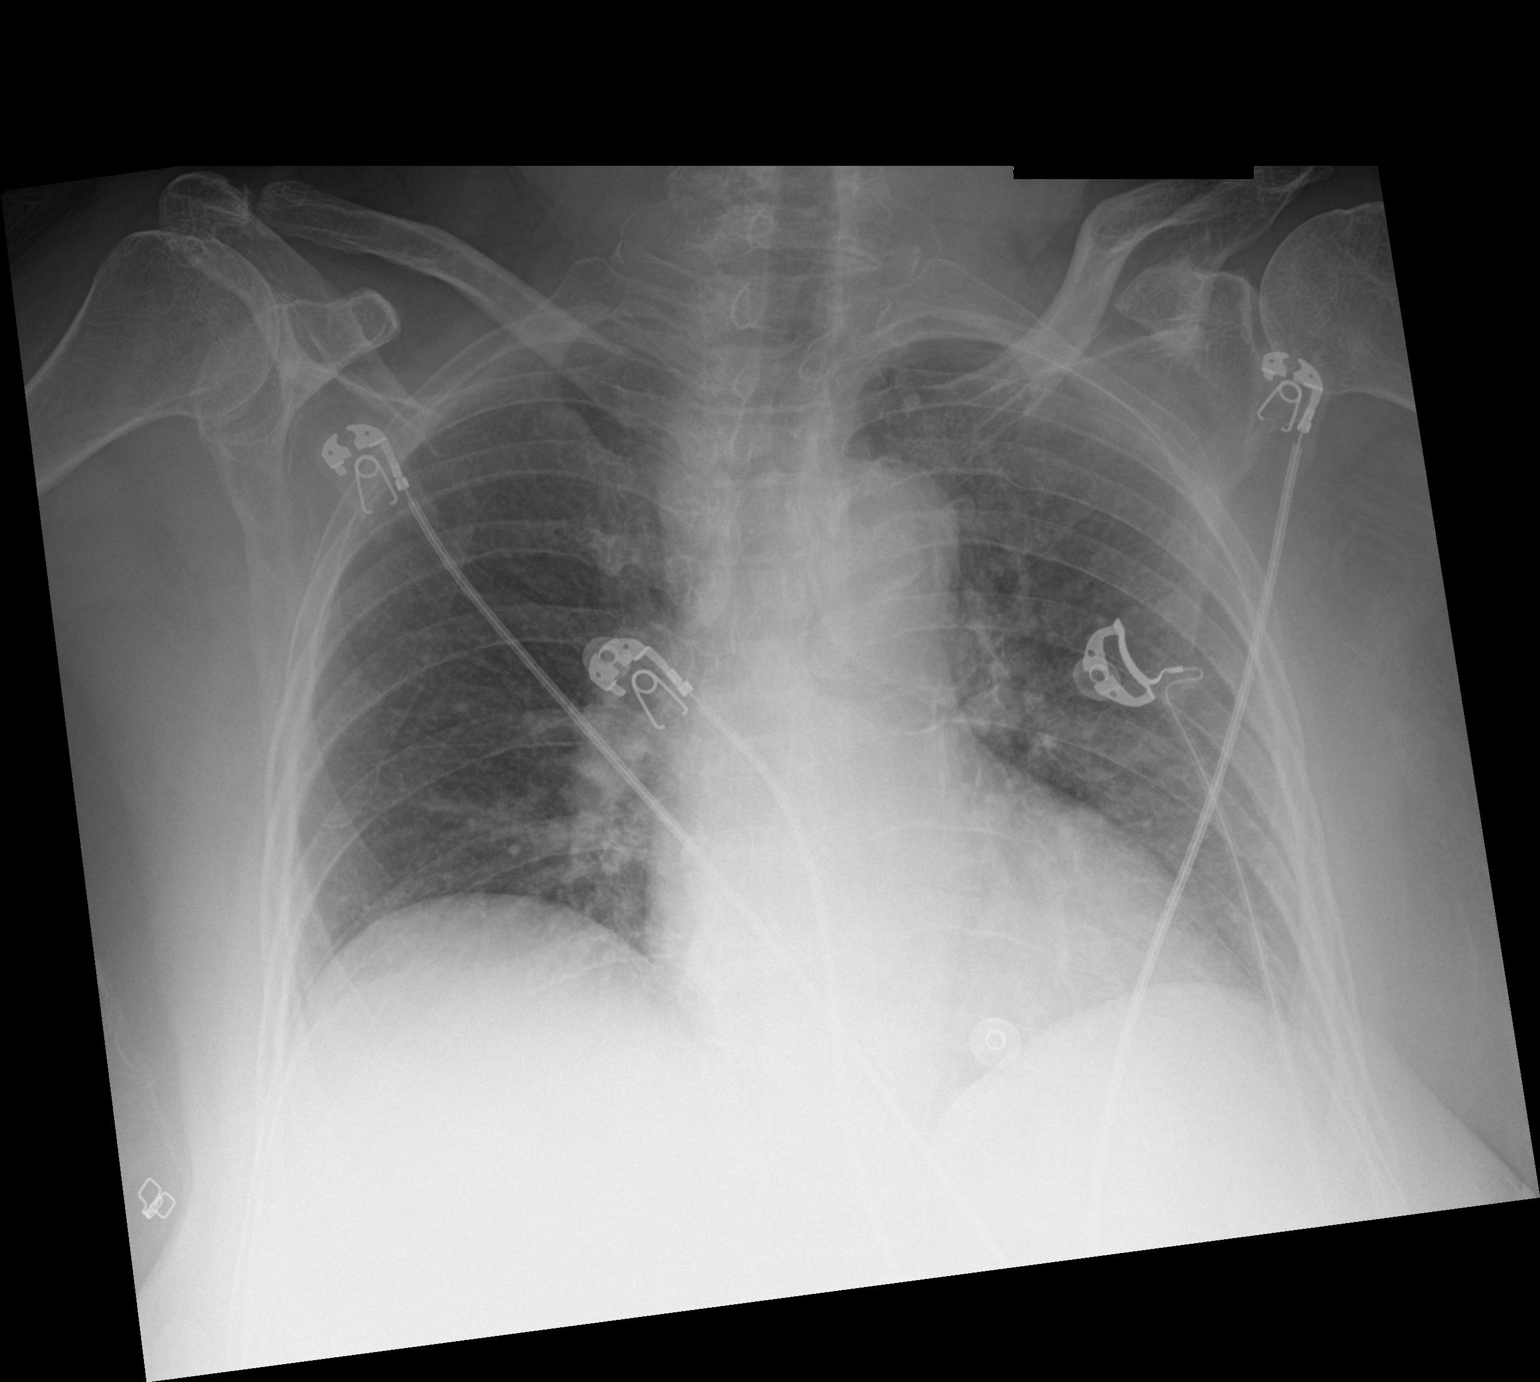

[1 of 1 positions shown; findings below may reference images not displayed]

FINDINGS: Heart size is normal. Mediastinum appears stable. Calcified plaques
in the aortic arch. No focal consolidation identified. No pleural
effusion or pneumothorax visualized.
IMPRESSION: No acute intrathoracic process identified.

## 2021-09-19 MED ORDER — TRAZODONE HCL 100 MG PO TABS
100.0000 mg | ORAL_TABLET | Freq: Every evening | ORAL | Status: DC | PRN
  Administered 2021-09-19: 100 mg via ORAL
  Filled 2021-09-19 (×2): qty 1

## 2021-09-19 MED ORDER — METRONIDAZOLE 500 MG/100ML IV SOLN
500.0000 mg | Freq: Once | INTRAVENOUS | Status: AC
  Administered 2021-09-19: 500 mg via INTRAVENOUS
  Filled 2021-09-19: qty 100

## 2021-09-19 MED ORDER — LACTATED RINGERS IV BOLUS
500.0000 mL | Freq: Once | INTRAVENOUS | Status: AC
  Administered 2021-09-19: 500 mL via INTRAVENOUS

## 2021-09-19 MED ORDER — ACETAMINOPHEN 650 MG RE SUPP
650.0000 mg | Freq: Four times a day (QID) | RECTAL | Status: DC | PRN
  Filled 2021-09-19: qty 1

## 2021-09-19 MED ORDER — ENOXAPARIN SODIUM 40 MG/0.4ML IJ SOSY
40.0000 mg | PREFILLED_SYRINGE | INTRAMUSCULAR | Status: DC
  Administered 2021-09-19 – 2021-09-30 (×12): 40 mg via SUBCUTANEOUS
  Filled 2021-09-19 (×12): qty 0.4

## 2021-09-19 MED ORDER — PRAVASTATIN SODIUM 40 MG PO TABS
40.0000 mg | ORAL_TABLET | Freq: Every day | ORAL | Status: DC
  Administered 2021-09-21 – 2021-09-30 (×10): 40 mg via ORAL
  Filled 2021-09-19: qty 1
  Filled 2021-09-19: qty 2
  Filled 2021-09-19 (×2): qty 1
  Filled 2021-09-19: qty 2
  Filled 2021-09-19 (×6): qty 1

## 2021-09-19 MED ORDER — ACETAMINOPHEN 325 MG PO TABS
650.0000 mg | ORAL_TABLET | Freq: Four times a day (QID) | ORAL | Status: DC | PRN
  Administered 2021-09-21 – 2021-09-23 (×2): 650 mg via ORAL
  Filled 2021-09-19 (×2): qty 2

## 2021-09-19 MED ORDER — SODIUM CHLORIDE 0.9 % IV SOLN: 2.0000 g | Freq: Two times a day (BID) | INTRAVENOUS | Status: DC

## 2021-09-19 MED ORDER — VANCOMYCIN HCL IN DEXTROSE 1-5 GM/200ML-% IV SOLN
1000.0000 mg | Freq: Once | INTRAVENOUS | Status: AC
  Administered 2021-09-19: 1000 mg via INTRAVENOUS
  Filled 2021-09-19: qty 200

## 2021-09-19 MED ORDER — SODIUM CHLORIDE 0.9 % IV SOLN
2.0000 g | Freq: Once | INTRAVENOUS | Status: AC
  Administered 2021-09-19: 2 g via INTRAVENOUS
  Filled 2021-09-19: qty 2

## 2021-09-19 MED ORDER — ARIPIPRAZOLE 10 MG PO TABS
10.0000 mg | ORAL_TABLET | Freq: Every day | ORAL | Status: DC
  Administered 2021-09-21 – 2021-09-30 (×10): 10 mg via ORAL
  Filled 2021-09-19 (×11): qty 1

## 2021-09-19 MED ORDER — VANCOMYCIN HCL IN DEXTROSE 1-5 GM/200ML-% IV SOLN
1000.0000 mg | INTRAVENOUS | Status: DC
  Administered 2021-09-20: 1000 mg via INTRAVENOUS
  Filled 2021-09-19 (×2): qty 200

## 2021-09-19 MED ORDER — POTASSIUM CHLORIDE IN NACL 20-0.9 MEQ/L-% IV SOLN
INTRAVENOUS | Status: AC
  Filled 2021-09-19: qty 1000

## 2021-09-19 MED ORDER — GABAPENTIN 300 MG PO CAPS
600.0000 mg | ORAL_CAPSULE | Freq: Two times a day (BID) | ORAL | Status: DC
  Administered 2021-09-19 – 2021-09-30 (×21): 600 mg via ORAL
  Filled 2021-09-19 (×22): qty 2

## 2021-09-19 MED ORDER — TRAMADOL HCL 50 MG PO TABS
50.0000 mg | ORAL_TABLET | Freq: Three times a day (TID) | ORAL | Status: DC | PRN
  Filled 2021-09-19: qty 1

## 2021-09-19 MED ORDER — LACTATED RINGERS IV BOLUS (SEPSIS)
1000.0000 mL | Freq: Once | INTRAVENOUS | Status: AC
  Administered 2021-09-19: 1000 mL via INTRAVENOUS

## 2021-09-19 MED ORDER — METRONIDAZOLE 500 MG/100ML IV SOLN
500.0000 mg | Freq: Two times a day (BID) | INTRAVENOUS | Status: DC
  Administered 2021-09-20 – 2021-09-21 (×3): 500 mg via INTRAVENOUS
  Filled 2021-09-19 (×3): qty 100

## 2021-09-19 MED ORDER — LACTATED RINGERS IV BOLUS
1000.0000 mL | Freq: Once | INTRAVENOUS | Status: AC
  Administered 2021-09-19: 1000 mL via INTRAVENOUS

## 2021-09-19 MED ORDER — ACETAMINOPHEN 650 MG RE SUPP
650.0000 mg | Freq: Once | RECTAL | Status: AC
  Administered 2021-09-19: 650 mg via RECTAL
  Filled 2021-09-19: qty 1

## 2021-09-19 MED ORDER — CHLORHEXIDINE GLUCONATE CLOTH 2 % EX PADS
6.0000 | MEDICATED_PAD | Freq: Every day | CUTANEOUS | Status: DC
  Administered 2021-09-20 – 2021-09-30 (×10): 6 via TOPICAL

## 2021-09-19 MED ORDER — ONDANSETRON HCL 4 MG/2ML IJ SOLN: 4.0000 mg | Freq: Four times a day (QID) | INTRAMUSCULAR | Status: DC | PRN

## 2021-09-19 MED ORDER — LACTATED RINGERS IV SOLN: INTRAVENOUS | Status: AC

## 2021-09-19 MED ORDER — POTASSIUM CHLORIDE 10 MEQ/100ML IV SOLN
10.0000 meq | INTRAVENOUS | Status: AC
  Administered 2021-09-19 (×2): 10 meq via INTRAVENOUS
  Filled 2021-09-19 (×3): qty 100

## 2021-09-19 MED ORDER — MAGNESIUM SULFATE 2 GM/50ML IV SOLN
2.0000 g | Freq: Once | INTRAVENOUS | Status: AC
  Administered 2021-09-19: 2 g via INTRAVENOUS
  Filled 2021-09-19: qty 50

## 2021-09-19 MED ORDER — LEVOTHYROXINE SODIUM 25 MCG PO TABS
25.0000 ug | ORAL_TABLET | Freq: Every day | ORAL | Status: DC
  Administered 2021-09-20 – 2021-09-30 (×10): 25 ug via ORAL
  Filled 2021-09-19 (×11): qty 1

## 2021-09-19 MED ORDER — ASPIRIN 81 MG PO CHEW
81.0000 mg | CHEWABLE_TABLET | Freq: Every day | ORAL | Status: DC
  Administered 2021-09-21 – 2021-09-30 (×10): 81 mg via ORAL
  Filled 2021-09-19 (×11): qty 1

## 2021-09-19 MED ORDER — DULOXETINE HCL 30 MG PO CPEP
60.0000 mg | ORAL_CAPSULE | Freq: Two times a day (BID) | ORAL | Status: DC
  Administered 2021-09-19 – 2021-09-30 (×21): 60 mg via ORAL
  Filled 2021-09-19 (×22): qty 2

## 2021-09-19 MED ORDER — ONDANSETRON HCL 4 MG PO TABS: 4.0000 mg | ORAL_TABLET | Freq: Four times a day (QID) | ORAL | Status: DC | PRN

## 2021-09-19 MED ORDER — LACTATED RINGERS IV BOLUS (SEPSIS): 1000.0000 mL | Freq: Once | INTRAVENOUS | Status: DC

## 2021-09-19 NOTE — Progress Notes (Signed)
Pharmacy Antibiotic Note  Tiffany SCHLEICHER is a 75 y.o. female admitted on 09/19/2021 with sepsis.  Pharmacy has been consulted for vancomycin and cefepime dosing.  Plan: Cefepime 2g IV q12h x 7 days Vancomycin 1g IV q24h for estimated AUC 496 using SCr 0.95 Check vancomycin levels as needed, goal AUC 400-550 Follow up renal function & cultures  Height: 5\' 2"  (157.5 cm) Weight: 72.6 kg (160 lb) IBW/kg (Calculated) : 50.1  Temp (24hrs), Avg:103.9 F (39.9 C), Min:103.1 F (39.5 C), Max:104.7 F (40.4 C)  Recent Labs  Lab 09/19/21 1132 09/19/21 1323  WBC 1.1*  --   CREATININE 0.95  --   LATICACIDVEN 3.7* 4.0*    Estimated Creatinine Clearance: 48.5 mL/min (by C-G formula based on SCr of 0.95 mg/dL).    No Known Allergies  Antimicrobials this admission:  11/19 Vanc >> 11/19 Cefepime >> 11/19 Flagyl >>  Dose adjustments this admission:   Microbiology results:  11/19 BCx: 11/19 COVID: neg 11/19 Flu: ;neg  Thank you for allowing pharmacy to be a part of this patient's care.  Peggyann Juba, PharmD, BCPS Pharmacy: [phone] 09/19/2021 3:38 PM

## 2021-09-19 NOTE — ED Provider Notes (Signed)
[place] DEPT Provider Note   CSN: [id number] Arrival date & time: 09/19/21  1030     History Chief Complaint  Patient presents with   Altered Mental Status    KRISTIANNA SAPERSTEIN is a 75 y.o. female.  HPI  75 year old female past medical history of HLD, reportedly independent lives at home alone presents emergency department concern for altered mental status.  Level 5 caveat due to altered mental status.  Report as per EMS, no family at bedside.  Daughter told EMS that she spoke to her mother last night around 6 PM.  She sounded baseline.  Today at some point patient was noted to be altered and ambulance was called.  No further history available at this time.  Patient has history of previous presentation secondary to UTI.  Past Medical History:  Diagnosis Date   anal ca dx'd 11/2011   squamous cell carcinoma   Anxiety    Arthritis 2022   Depression    Full dentures    Hemorrhoids    external   History of radiation therapy 11/29/11 to 01/05/12   anal canal 5040 cGy 28 sessions, regional lymph nodes 4200 cGy 28 sessions   Hyperlipidemia    Rectal bleeding    Vitamin D deficiency    Wears glasses     Patient Active Problem List   Diagnosis Date Noted   Arthritis of right knee    UTI (urinary tract infection) 06/26/2021   Severe sepsis ([place]) 98/92/1194   Acute metabolic encephalopathy 17/40/8144   Hypothyroid 06/25/2021   S/P total knee arthroplasty, right 06/09/2021   Fever    Staphylococcus aureus bacteremia with sepsis ([place])    SIRS (systemic inflammatory response syndrome) ([place]) 11/30/2020   Lumbar radiculopathy 10/10/2019   Bilateral stenosis of lateral recess of lumbar spine 10/10/2019   Hemorrhoids, external without complications 81/85/6314   Depression    Anxiety    Rectal bleeding    History of radiation therapy    Malignant neoplasm of anal canal ([place]) 11/15/2011   Hemorrhoids, internal, with bleeding 09/07/2011   Acquired  anal stenosis 09/07/2011   Hyperlipidemia 09/07/2011   Family history of breast cancer in sister 09/07/2011    Past Surgical History:  Procedure Laterality Date   ABDOMINAL HYSTERECTOMY  1978   age 67   [place]  12/04/2020   Procedure: BUBBLE STUDY;  Surgeon: [name], MD;  Location: [place];  Service: Cardiology;;   EXAMINATION UNDER ANESTHESIA  06/21/2012   Procedure: EXAM UNDER ANESTHESIA;  Surgeon: [name], MD;  Location: [place];  Service: General;  Laterality: N/A;  rectal exam under anesthesia, anal dilation, rectal biopsy   [place]   Patient had to guess on the date.   RECTAL BIOPSY  06/21/2012   Procedure: BIOPSY RECTAL;  Surgeon: [name], MD;  Location: [place];  Service: General;  Laterality: N/A;   TEE WITHOUT CARDIOVERSION N/A 12/04/2020   Procedure: TRANSESOPHAGEAL ECHOCARDIOGRAM (TEE);  Surgeon: [name], MD;  Location: [place];  Service: Cardiology;  Laterality: N/A;   TOTAL KNEE ARTHROPLASTY Right 06/09/2021   Procedure: RIGHT TOTAL KNEE ARTHROPLASTY;  Surgeon: Meredith Pel, MD;  Location: [place];  Service: Orthopedics;  Laterality: Right;   TUBAL LIGATION     b/l  age 72     OB History     Gravida  1   Para  1   Term  Preterm      AB      Living         SAB      IAB      Ectopic      Multiple      Live Births              Family History  Problem Relation Age of Onset   Cancer Sister        breast/ age 25   Cervical cancer Sister        60   Alcohol abuse Maternal Uncle     Social History   Tobacco Use   Smoking status: Former    Packs/day: 0.50    Years: 25.00    Pack years: 12.50    Types: Cigarettes    Quit date: 09/07/1987    Years since quitting: 34.0   Smokeless tobacco: Never  Vaping Use   Vaping Use: Never used  Substance Use Topics   Alcohol use: No   Drug use: No    Home Medications Prior to Admission  medications   Medication Sig Start Date End Date Taking? Authorizing Provider  acetaminophen (TYLENOL) 650 MG CR tablet Take 650 mg by mouth every 8 (eight) hours as needed for pain.    [provider]  ARIPiprazole (ABILIFY) 10 MG tablet Take 10 mg by mouth in the morning. 11/23/18   [provider]  aspirin 81 MG chewable tablet Chew 1 tablet (81 mg total) by mouth 2 (two) times daily. Patient taking differently: Chew 81 mg by mouth in the morning. 06/13/21   Meredith Pel, MD  Calcium Carb-Cholecalciferol (CALCIUM 600+D3 PO) Take 1 tablet by mouth in the morning.    [provider]  celecoxib (CELEBREX) 100 MG capsule Take 1 capsule (100 mg total) by mouth daily as needed for mild pain. 07/20/21   Magnant, Gerrianne Scale, PA-C  cholecalciferol (VITAMIN D) 25 MCG (1000 UNIT) tablet Take 1,000 Units by mouth in the morning.    [provider]  DULoxetine (CYMBALTA) 60 MG capsule Take 60 mg by mouth 2 (two) times daily.    [provider]  gabapentin (NEURONTIN) 600 MG tablet Take 600 mg by mouth 2 (two) times daily. 07/14/16   [provider]  levothyroxine (SYNTHROID, LEVOTHROID) 25 MCG tablet Take 25 mcg by mouth daily before breakfast. 08/29/18   [provider]  methocarbamol (ROBAXIN) 500 MG tablet Take 1 tablet (500 mg total) by mouth every 6 (six) hours as needed for muscle spasms. 06/13/21   Meredith Pel, MD  Multiple Vitamin (MULTIVITAMIN WITH MINERALS) TABS tablet Take 1 tablet by mouth in the morning. Centrum Silver for Women    [provider]  pravastatin (PRAVACHOL) 40 MG tablet Take 40 mg by mouth daily.    [provider]  traZODone (DESYREL) 100 MG tablet Take 100 mg by mouth at bedtime.    [provider]  vitamin B-12 (CYANOCOBALAMIN) 500 MCG tablet Take 500 mcg by mouth in the morning.    [provider]    Allergies    Patient has no known allergies.  Review of Systems    Review of Systems  Unable to perform ROS: Mental status change   Physical Exam Updated Vital Signs BP (!) 145/110 (BP Location: Left Arm)   Pulse (!) 113   Temp (!) 103.1 F (39.5 C) (Oral)   Resp (!) 24   Ht 5\' 2"  (1.575 m)  Wt 72.6 kg   SpO2 99%   BMI 29.26 kg/m   Physical Exam Vitals and nursing note reviewed.  Constitutional:      Appearance: Normal appearance. She is ill-appearing.  HENT:     Head: Normocephalic and atraumatic.     Mouth/Throat:     Mouth: Mucous membranes are moist.  Eyes:     Pupils: Pupils are equal, round, and reactive to light.  Cardiovascular:     Rate and Rhythm: Tachycardia present.  Pulmonary:     Comments: tachypnea Abdominal:     General: There is no distension.     Palpations: Abdomen is soft.     Tenderness: There is no abdominal tenderness.  Musculoskeletal:     Cervical back: No rigidity.  Skin:    General: Skin is warm.     Coloration: Skin is pale.  Neurological:     Mental Status: She is alert and oriented to person, place, and time.    ED Results / Procedures / Treatments   Labs (all labs ordered are listed, but only abnormal results are displayed) Labs Reviewed  CULTURE, BLOOD (ROUTINE X 2)  CULTURE, BLOOD (ROUTINE X 2)  RESP PANEL BY RT-PCR (FLU A&B, COVID) ARPGX2  CBC WITH DIFFERENTIAL/PLATELET  COMPREHENSIVE METABOLIC PANEL  LACTIC ACID, PLASMA  LACTIC ACID, PLASMA  URINALYSIS, ROUTINE W REFLEX MICROSCOPIC  BLOOD GAS, VENOUS    EKG None  Radiology No results found.  Procedures .Critical Care Performed by: Lorelle Gibbs, DO Authorized by: Lorelle Gibbs, DO   Critical care provider statement:    Critical care time (minutes):  45   Critical care time was exclusive of:  Separately billable procedures and treating other patients   Critical care was necessary to treat or prevent imminent or life-threatening deterioration of the following conditions:  Sepsis   Critical care was time spent  personally by me on the following activities:  Development of treatment plan with patient or surrogate, discussions with consultants, evaluation of patient's response to treatment, examination of patient, ordering and review of laboratory studies, ordering and review of radiographic studies, ordering and performing treatments and interventions, pulse oximetry, re-evaluation of patient's condition and review of old charts   Care discussed with: admitting provider     Medications Ordered in ED Medications  lactated ringers bolus 1,000 mL (has no administration in time range)  acetaminophen (TYLENOL) suppository 650 mg (has no administration in time range)    ED Course  I have reviewed the triage vital signs and the nursing notes.  Pertinent labs & imaging results that were available during my care of the patient were reviewed by me and considered in my medical decision making (see chart for details).    MDM Rules/Calculators/A&P                           75 year old female presents emergency department altered mental status.  Initially history limited, no family at bedside.  Patient was reportedly last known normal last evening and found at her home altered with no pants on.  She is febrile, tachycardic, stable blood pressure on arrival.  Altered but eyes open and responsive to verbal stimuli/pain.  She is protecting her airway.  Blood work shows neutropenia, initial lactic is elevated at 4.0, metabolic panel is essentially unremarkable, she is not significantly acidotic.  Flu and COVID are negative.  Chest x-ray shows no pneumonia.  Patient had a similar presentation couple months ago  with urosepsis.  This is my primary suspicion, we are still pending urinalysis.  Blood cultures have been sent.  After rectal Tylenol and fluid hydration temperature, heart rate has improved.  Patient's mental status is improving as well.  Plan to treat broadly for sepsis pending urinalysis and admit.  Patients  evaluation and results requires admission for further treatment and care. Patient agrees with admission plan, offers no new complaints and is stable/unchanged at time of admit.  Final Clinical Impression(s) / ED Diagnoses Final diagnoses:  None    Rx / DC Orders ED Discharge Orders     None        Lorelle Gibbs, DO 09/19/21 1516

## 2021-09-19 NOTE — H&P (Signed)
History and Physical    [name] NWG:[id number] DOB: 1946-07-15 DOA: 09/19/2021  PCP: [name], MD  Patient coming from: Home.  I have personally briefly reviewed patient's old medical records in [place]  Chief Complaint: AMS.  HPI: [name] is a 75 y.o. female with medical history significant of anal cancer, anxiety, osteoarthritis, depression, hyperlipidemia, history of rectal bleeding, history of Staphylococcus bacteremia with sepsis who is coming to the emergency department due to altered mental status.  She is normally fully oriented.  Her daughter spoke to her yesterday evening 1800.  This morning she was only responding to pain and not following commands so EMS brought her to the emergency department.  The patient is only oriented to name.  She is unable to provide further information.  She has a history of a similar presentation in the past due to urinary tract infection.  ED Course: Initial vital signs were temperature 103.1 F, pulse 113, respirations 24, BP 144/130 mmHg.  The patient received 2000 mL of LR bolus, cefepime, metronidazole and vancomycin.  I added potassium supplementation, magnesium sulfate 2 g IVPB and more fluids due to tachycardia with prolonged QT interval.  Lab work: Urinalysis showed large hemoglobinuria, proteinuria 30 mg deciliter, large leukocyte esterase, 11-20 RBC, more than 50 WBC, WBC clumps and many bacteria on microscopic examination.  Coronavirus and influenza PCR was negative.  Lactic acid was 3.7, then 4.0 and then 4.0 mmol/L.  CBC showed a white count of 1.1 with 81% neutrophils, hemoglobin 12.4 g/dL and platelets 152.  Venous blood gas was normal except for an increased PO2 of 48.6 mmHg.  CMP showed potassium 3.2 mmol/L and glucose of 101 mg/dL.  The rest of the CMP values were normal.  Imaging: One-view portable chest radiograph showed no acute intrathoracic process.  Please see images and full daily report for further  details.  Review of Systems: As per HPI otherwise all other systems reviewed and are negative.  Past Medical History:  Diagnosis Date   anal ca dx'd 11/2011   squamous cell carcinoma   Anxiety    Arthritis 2022   Depression    Full dentures    Hemorrhoids    external   History of radiation therapy 11/29/11 to 01/05/12   anal canal 5040 cGy 28 sessions, regional lymph nodes 4200 cGy 28 sessions   Hyperlipidemia    Lumbar radiculopathy 10/10/2019   Rectal bleeding    Staphylococcus aureus bacteremia with sepsis ([place])    UTI (urinary tract infection) 06/26/2021   Vitamin D deficiency    Wears glasses    Past Surgical History:  Procedure Laterality Date   ABDOMINAL HYSTERECTOMY  1978   age 46   BUBBLE STUDY  12/04/2020   Procedure: BUBBLE STUDY;  Surgeon: [name], MD;  Location: [facility] ENDOSCOPY;  Service: Cardiology;;   EXAMINATION UNDER ANESTHESIA  06/21/2012   Procedure: EXAM UNDER ANESTHESIA;  Surgeon: [name], MD;  Location: [place];  Service: General;  Laterality: N/A;  rectal exam under anesthesia, anal dilation, rectal biopsy   [place]   Patient had to guess on the date.   RECTAL BIOPSY  06/21/2012   Procedure: BIOPSY RECTAL;  Surgeon: [name], MD;  Location: [place];  Service: General;  Laterality: N/A;   TEE WITHOUT CARDIOVERSION N/A 12/04/2020   Procedure: TRANSESOPHAGEAL ECHOCARDIOGRAM (TEE);  Surgeon: [name], MD;  Location: [place];  Service: Cardiology;  Laterality: N/A;   TOTAL KNEE ARTHROPLASTY Right 06/09/2021   Procedure: RIGHT TOTAL KNEE ARTHROPLASTY;  Surgeon: Meredith Pel, MD;  Location: [place];  Service: Orthopedics;  Laterality: Right;   TUBAL LIGATION     b/l  age 24   Social History  reports that she quit smoking about 34 years ago. Her smoking use included cigarettes. She has a 12.50 pack-year smoking history. She has never used smokeless tobacco. She reports that  she does not drink alcohol and does not use drugs.  No Known Allergies  Family History  Problem Relation Age of Onset   Cancer Sister        breast/ age 23   Cervical cancer Sister        67   Alcohol abuse Maternal Uncle    Prior to Admission medications   Medication Sig Start Date End Date Taking? Authorizing Provider  acetaminophen (TYLENOL) 650 MG CR tablet Take 650 mg by mouth every 8 (eight) hours as needed for pain.   Yes [provider]  ARIPiprazole (ABILIFY) 10 MG tablet Take 10 mg by mouth in the morning. 11/23/18  Yes [provider]  aspirin 81 MG chewable tablet Chew 1 tablet (81 mg total) by mouth 2 (two) times daily. Patient taking differently: Chew 81 mg by mouth in the morning. 06/13/21  Yes Meredith Pel, MD  Calcium Carb-Cholecalciferol (CALCIUM 600+D3 PO) Take 1 tablet by mouth in the morning.   Yes [provider]  celecoxib (CELEBREX) 100 MG capsule Take 1 capsule (100 mg total) by mouth daily as needed for mild pain. 07/20/21  Yes Magnant, Gerrianne Scale, PA-C  cholecalciferol (VITAMIN D) 25 MCG (1000 UNIT) tablet Take 1,000 Units by mouth in the morning.   Yes [provider]  DULoxetine (CYMBALTA) 60 MG capsule Take 60 mg by mouth 2 (two) times daily.   Yes [provider]  gabapentin (NEURONTIN) 600 MG tablet Take 600 mg by mouth 2 (two) times daily. 07/14/16  Yes [provider]  levothyroxine (SYNTHROID, LEVOTHROID) 25 MCG tablet Take 25 mcg by mouth daily before breakfast. 08/29/18  Yes [provider]  methocarbamol (ROBAXIN) 500 MG tablet Take 1 tablet (500 mg total) by mouth every 6 (six) hours as needed for muscle spasms. 06/13/21  Yes Meredith Pel, MD  Multiple Vitamin (MULTIVITAMIN WITH MINERALS) TABS tablet Take 1 tablet by mouth in the morning. Centrum Silver for Women   Yes [provider]  pravastatin (PRAVACHOL) 40 MG tablet Take 40 mg by mouth daily.   Yes [provider]  traMADol (ULTRAM) 50 MG tablet Take 50 mg by mouth every 8 (eight) hours as needed for moderate pain.   Yes [provider]  traZODone (DESYREL) 100 MG tablet Take 100 mg by mouth at bedtime.   Yes [provider]  vitamin B-12 (CYANOCOBALAMIN) 500 MCG tablet Take 500 mcg by mouth in the morning.   Yes [provider]   Physical Exam: Vitals:   09/19/21 1103 09/19/21 1107 09/19/21 1315 09/19/21 1415  BP: (!) 145/110  (!) 119/54 (!) 130/91  Pulse: (!) 113  (!) 126 (!) 135  Resp: (!) 24  (!) 31 (!) 35  Temp: (!) 103.1 F (39.5 C)  (!) 104.7 F (40.4 C)   TempSrc: Oral  Rectal   SpO2: 99%  99% 97%  Weight:  72.6 kg    Height:  5\' 2"  (1.575 m)     Constitutional: Looks acutely ill.  NAD, calm, comfortable Eyes: PERRL, lids and conjunctivae normal.  Bilateral injected sclera. ENMT: Mucous membranes, lips are very dry.  Posterior pharynx clear of any exudate or lesions. Neck: normal, supple, no masses, no thyromegaly Respiratory: Tachypneic in the mid 20s with shallow inspiration, but otherwise clear to auscultation bilaterally, no wheezing, no crackles.  No accessory muscle use.  Cardiovascular: Sinus tachycardia in the 110s, no murmurs / rubs / gallops. No extremity edema. 2+ pedal pulses. No carotid bruits.  Abdomen: Obese, no distention.  Bowel sounds positive.  Soft, no tenderness, no masses palpated. No hepatosplenomegaly.  Musculoskeletal: no clubbing / cyanosis. Good ROM, no contractures. Normal muscle tone.  Skin: no acute rashes, lesions, ulcers on very limited dermatological examination. Neurologic: Grossly nonfocal on limited neurological semination. Psychiatric: Obtunded.  Wakes up briefly.  Oriented to name only.  Labs on Admission: I have personally reviewed following labs and imaging studies  CBC: Recent Labs  Lab 09/19/21 1132  WBC 1.1*  NEUTROABS 0.9*  HGB 12.4  HCT 38.1  MCV 96.2  PLT 458   Basic Metabolic  Panel: Recent Labs  Lab 09/19/21 1132  NA 139  K 3.2*  CL 103  CO2 25  GLUCOSE 101*  BUN 16  CREATININE 0.95  CALCIUM 9.0   GFR: Estimated Creatinine Clearance: 48.5 mL/min (by C-G formula based on SCr of 0.95 mg/dL).  Liver Function Tests: Recent Labs  Lab 09/19/21 1132  AST 36  ALT 23  ALKPHOS 113  BILITOT 1.2  PROT 7.2  ALBUMIN 3.9   Radiological Exams on Admission: DG Chest Port 1 View  Result Date: 09/19/2021 CLINICAL DATA:  Possible sepsis EXAM: PORTABLE CHEST 1 VIEW COMPARISON:  Chest x-ray 06/25/2021 FINDINGS: Heart size is normal. Mediastinum appears stable. Calcified plaques in the aortic arch. No focal consolidation identified. No pleural effusion or pneumothorax visualized. IMPRESSION: No acute intrathoracic process identified. Electronically Signed   By: Ofilia Neas M.D.   On: 09/19/2021 12:35    EKG: Independently reviewed.  Vent. rate 125 BPM PR interval 47 ms QRS duration 94 ms QT/QTcB 423/611 ms P-R-T axes 81 -53 250 Sinus tachycardia Multiple premature complexes, vent & supraven LAD, consider left anterior fascicular block Borderline low voltage, extremity leads Abnormal R-wave progression, early transition ST depr, consider ischemia, anterolateral lds Prolonged QT interval  Assessment/Plan Principal Problem:   Acute metabolic encephalopathy In the setting of:   Sepsis due to undetermined organism ([place]) Admit to stepdown/inpatient. Continue IV fluids. Continue cefepime per pharmacy. Continue metronidazole 500 mg IVPB q 12 hr. Continue vancomycin per pharmacy. Follow-up CBC and chemistry in AM. Follow-up blood culture and sensitivity for Follow-up urine culture and sensitivity.  Active Problems:   Leukopenia ANC is around 900. Monitor WBC. Continue current treatment.    Hypokalemia Replacing. Magnesium was supplemented. Follow-up potassium level.    Prolonged QT interval Avoid QT prolonging meds if possible. Keep  electrolytes optimized    Hyperlipidemia Currently not on medical therapy. Follow-up with PCP.    Depression with anxiety Continue Cymbalta 60 mg p.o. twice daily. Continue trazodone 100 mg p.o. at bedtime PRN.    Hypothyroid Continue levothyroxine 25 mcg p.o. daily.    DVT prophylaxis: Lovenox SQ. Code Status:   Full code. Family Communication:   Disposition Plan:   Patient is from:  Home.  Anticipated DC to:  Home.  Anticipated DC date:  09/21/2021 or 09/22/2021.  Anticipated DC barriers: Clinical status.  Consults called:   Admission status:  Inpatient/stepdown.  Severity of Illness: High severity after presenting to the emergency department in with altered mental status in the setting of acute metabolic encephalopathy due to sepsis secondary to urine tract infection.  The patient will stay in the hospital for 48 to 72 hours for further therapy.  Reubin Milan MD [facility]  How to contact the [facility] Attending or Consulting provider [place] or covering provider during after hours [place], for this patient?   Check the care team in [facility] and look for a) attending/consulting [facility] provider listed and b) the [facility] team listed Log into [email or web address] and use [place]'s universal password to access. If you do not have the password, please contact the hospital operator. Locate the [facility] provider you are looking for under [facility] and page to a number that you can be directly reached. If you still have difficulty reaching the provider, please page the [facility] (Director on Call) for the Hospitalists listed on amion for assistance.  09/19/2021, 3:39 PM   This document was prepared using Dragon voice recognition software and may contain some unintended transcription errors.

## 2021-09-19 NOTE — ED Triage Notes (Signed)
Pt BIB [facility] from home for altered mental status. Pt normally A&Ox4, lives alone. Daughter last spoke to her 6p last night. This morning, pt alert only to pain and not following commands. Hx UTI, hospitalized 71m ago for same. EMS reports strong smelling urine. 12 lead unremarkable.  BP 152/126 HR 112 EtCO2 35 RR 20-30 CBG 126

## 2021-09-19 NOTE — ED Notes (Signed)
Lactic 4.0   Reported to Fulton Reek, MD

## 2021-09-19 NOTE — ED Notes (Signed)
MD Olevia Bowens made aware of pt VS and hypotension, MD to put in orders

## 2021-09-19 NOTE — ED Notes (Signed)
MD ortiz at bedside °

## 2021-09-19 NOTE — ED Notes (Signed)
Pt NAD in bed, a/ox1- to name only. Follows commands. BP hypotensive, pt receiving several ABX and IVF. +pmsc but weak radial pulse. Will continue to monitor

## 2021-09-19 NOTE — Progress Notes (Signed)
A consult was received from an ED physician for cefepime/vancomycin per pharmacy dosing.  The patient's profile has been reviewed for ht/wt/allergies/indication/available labs.   A one time order has been placed for cefepime 2g and vancomycin 1g.  Further antibiotics/pharmacy consults should be ordered by admitting physician if indicated.                       Thank you, Peggyann Juba, PharmD, BCPS 09/19/2021  2:04 PM

## 2021-09-19 NOTE — ED Notes (Signed)
MD attempted to be contacted for non violetn restraint order but MD has logged out and a new MD has not assigned themselves

## 2021-09-19 NOTE — ED Notes (Signed)
Non violent restraints applied with torso belt

## 2021-09-19 NOTE — Progress Notes (Signed)
Elink following for sepsis protocol. 

## 2021-09-19 NOTE — ED Notes (Signed)
Attempt to contact MD blout who has assumed care of pt for restraints. Pt has been found with legs over siderails and nearly out of bed x 3. No sitters per CN. Bed alarm applied.

## 2021-09-19 NOTE — ED Notes (Signed)
Pt found to be almost out of bed with legs thrown over rails. Tech devin and this RN assist pt back into bed. Attempt to instruct pt to not pull lines or get out of bed but pt just muttering and yelling nonsense. CN made aware for need of sitter, with no sitter provided.

## 2021-09-20 DIAGNOSIS — A419 Sepsis, unspecified organism: Secondary | ICD-10-CM | POA: Diagnosis not present

## 2021-09-20 DIAGNOSIS — F419 Anxiety disorder, unspecified: Secondary | ICD-10-CM

## 2021-09-20 DIAGNOSIS — R4182 Altered mental status, unspecified: Secondary | ICD-10-CM

## 2021-09-20 LAB — COMPREHENSIVE METABOLIC PANEL
ALT: 66 U/L — ABNORMAL HIGH (ref 0–44)
AST: 107 U/L — ABNORMAL HIGH (ref 15–41)
Albumin: 2.7 g/dL — ABNORMAL LOW (ref 3.5–5.0)
Alkaline Phosphatase: 70 U/L (ref 38–126)
Anion gap: 11 (ref 5–15)
BUN: 20 mg/dL (ref 8–23)
CO2: 21 mmol/L — ABNORMAL LOW (ref 22–32)
Calcium: 7.7 mg/dL — ABNORMAL LOW (ref 8.9–10.3)
Chloride: 108 mmol/L (ref 98–111)
Creatinine, Ser: 0.99 mg/dL (ref 0.44–1.00)
GFR, Estimated: 60 mL/min — ABNORMAL LOW (ref >60)
Glucose, Bld: 77 mg/dL (ref 70–99)
Potassium: 3.8 mmol/L (ref 3.5–5.1)
Sodium: 140 mmol/L (ref 135–145)
Total Bilirubin: 0.8 mg/dL (ref 0.3–1.2)
Total Protein: 5.5 g/dL — ABNORMAL LOW (ref 6.5–8.1)

## 2021-09-20 LAB — CBC WITH DIFFERENTIAL/PLATELET
Abs Immature Granulocytes: 0.31 10*3/uL — ABNORMAL HIGH (ref 0.00–0.07)
Basophils Absolute: 0.1 10*3/uL (ref 0.0–0.1)
Basophils Relative: 0 %
Eosinophils Absolute: 0.2 10*3/uL (ref 0.0–0.5)
Eosinophils Relative: 1 %
HCT: 30.6 % — ABNORMAL LOW (ref 36.0–46.0)
Hemoglobin: 10.2 g/dL — ABNORMAL LOW (ref 12.0–15.0)
Immature Granulocytes: 2 %
Lymphocytes Relative: 3 %
Lymphs Abs: 0.5 10*3/uL — ABNORMAL LOW (ref 0.7–4.0)
MCH: 32.2 pg (ref 26.0–34.0)
MCHC: 33.3 g/dL (ref 30.0–36.0)
MCV: 96.5 fL (ref 80.0–100.0)
Monocytes Absolute: 0.5 10*3/uL (ref 0.1–1.0)
Monocytes Relative: 4 %
Neutro Abs: 13.4 10*3/uL — ABNORMAL HIGH (ref 1.7–7.7)
Neutrophils Relative %: 90 %
Platelets: 141 10*3/uL — ABNORMAL LOW (ref 150–400)
RBC: 3.17 MIL/uL — ABNORMAL LOW (ref 3.87–5.11)
RDW: 15.6 % — ABNORMAL HIGH (ref 11.5–15.5)
Smear Review: NORMAL
WBC: 15 10*3/uL — ABNORMAL HIGH (ref 4.0–10.5)
nRBC: 0 % (ref 0.0–0.2)

## 2021-09-20 LAB — BLOOD CULTURE ID PANEL (REFLEXED) - BCID2

## 2021-09-20 LAB — LACTIC ACID, PLASMA
Lactic Acid, Venous: 2.5 mmol/L (ref 0.5–1.9)
Lactic Acid, Venous: 1.5 mmol/L (ref 0.5–1.9)

## 2021-09-20 MED ORDER — SODIUM CHLORIDE 0.9 % IV SOLN
2.0000 g | INTRAVENOUS | Status: DC
  Administered 2021-09-20 – 2021-09-23 (×4): 2 g via INTRAVENOUS
  Filled 2021-09-20 (×4): qty 20

## 2021-09-20 MED ORDER — LORAZEPAM 2 MG/ML IJ SOLN
1.0000 mg | INTRAMUSCULAR | Status: DC | PRN
  Administered 2021-09-20 – 2021-09-21 (×3): 1 mg via INTRAVENOUS
  Filled 2021-09-20 (×3): qty 1

## 2021-09-20 MED ORDER — LIP MEDEX EX OINT
TOPICAL_OINTMENT | CUTANEOUS | Status: DC | PRN
  Administered 2021-09-21: 1 via TOPICAL
  Filled 2021-09-20: qty 7

## 2021-09-20 NOTE — Progress Notes (Signed)
PHARMACY - PHYSICIAN COMMUNICATION CRITICAL VALUE ALERT - BLOOD CULTURE IDENTIFICATION (BCID)  [name] is an 75 y.o. female who presented to [facility] on 09/19/2021 with a chief complaint of AMS  Assessment:  GNR, 1/4 Ecoli, no Resistance  Name of physician (or Provider) Contacted: Jeannette Corpus  Current antibiotics: vanc, cefepime, flagyl  Changes to prescribed antibiotics recommended:  D/c cefepime and start CTX 2gm IV q24h  Results for orders placed or performed during the hospital encounter of 09/19/21  Blood Culture ID Panel (Reflexed) (Collected: 09/19/2021 11:32 AM)  Result Value Ref Range   Enterococcus faecalis NOT DETECTED NOT DETECTED   Enterococcus Faecium NOT DETECTED NOT DETECTED   Listeria monocytogenes NOT DETECTED NOT DETECTED   Staphylococcus species NOT DETECTED NOT DETECTED   Staphylococcus aureus (BCID) NOT DETECTED NOT DETECTED   Staphylococcus epidermidis NOT DETECTED NOT DETECTED   Staphylococcus lugdunensis NOT DETECTED NOT DETECTED   Streptococcus species NOT DETECTED NOT DETECTED   Streptococcus agalactiae NOT DETECTED NOT DETECTED   Streptococcus pneumoniae NOT DETECTED NOT DETECTED   Streptococcus pyogenes NOT DETECTED NOT DETECTED   A.calcoaceticus-baumannii NOT DETECTED NOT DETECTED   Bacteroides fragilis NOT DETECTED NOT DETECTED   Enterobacterales DETECTED (A) NOT DETECTED   Enterobacter cloacae complex NOT DETECTED NOT DETECTED   Escherichia coli DETECTED (A) NOT DETECTED   Klebsiella aerogenes NOT DETECTED NOT DETECTED   Klebsiella oxytoca NOT DETECTED NOT DETECTED   Klebsiella pneumoniae NOT DETECTED NOT DETECTED   Proteus species NOT DETECTED NOT DETECTED   Salmonella species NOT DETECTED NOT DETECTED   Serratia marcescens NOT DETECTED NOT DETECTED   Haemophilus influenzae NOT DETECTED NOT DETECTED   Neisseria meningitidis NOT DETECTED NOT DETECTED   Pseudomonas aeruginosa NOT DETECTED NOT DETECTED   Stenotrophomonas maltophilia NOT  DETECTED NOT DETECTED   Candida albicans NOT DETECTED NOT DETECTED   Candida auris NOT DETECTED NOT DETECTED   Candida glabrata NOT DETECTED NOT DETECTED   Candida krusei NOT DETECTED NOT DETECTED   Candida parapsilosis NOT DETECTED NOT DETECTED   Candida tropicalis NOT DETECTED NOT DETECTED   Cryptococcus neoformans/gattii NOT DETECTED NOT DETECTED   CTX-M ESBL NOT DETECTED NOT DETECTED   Carbapenem resistance IMP NOT DETECTED NOT DETECTED   Carbapenem resistance KPC NOT DETECTED NOT DETECTED   Carbapenem resistance NDM NOT DETECTED NOT DETECTED   Carbapenem resist OXA 48 LIKE NOT DETECTED NOT DETECTED   Carbapenem resistance VIM NOT DETECTED NOT DETECTED    Dolly Rias RPh 09/20/2021, 1:41 AM

## 2021-09-20 NOTE — Progress Notes (Signed)
Patient still screaming, extremely agitated, and looks uncomfortable. VS stable. Will continue to monitor. NP, MD aware.

## 2021-09-20 NOTE — Progress Notes (Signed)
Patient still crying, screaming. Patient is agitated and does appear to be extremely uncomfortable. NP,MD aware. Will continue to monitor.

## 2021-09-20 NOTE — Progress Notes (Signed)
Patient has RASS of 2+, screaming and attempting to undo restraints. VS are stable and WNL. Patient is disoriented x4. MD made aware. No intervention at this time. Will continue to monitor patient.

## 2021-09-20 NOTE — Progress Notes (Signed)
PROGRESS NOTE    [name]  OIZ:[id number] DOB: Oct 10, 1946 DOA: 09/19/2021 PCP: [name], MD    Brief Narrative:  75 y.o. female with medical history significant of anal cancer, anxiety, osteoarthritis, depression, hyperlipidemia, history of rectal bleeding, history of Staphylococcus bacteremia with sepsis who is coming to the emergency department due to altered mental status.  She is normally fully oriented. Pt was found to be floridly septic with UTI and later ecoli bacteremia  Assessment & Plan:   Principal Problem:   Sepsis due to undetermined organism [facility]) Active Problems:   Hyperlipidemia   Depression   Anxiety   Acute metabolic encephalopathy   Hypothyroid   Hypokalemia   Leukopenia   Prolonged QT interval  Principal Problem:   Acute metabolic encephalopathy secondary to severe Sepsis secondary to ecoli bacteremia with UTI present on admit -Presented floridly septic with leukocytosis, lactic acidosis, tachycardia, tachypena -Blood cx pos for ecoli, pending sensitivities -Urine cx pending -Pt is continued on cefempime, flagyl, and vanc. Would likely begin to narrow abx coverage in the next 24hrs -repeat cbc and bmet in AM   Active Problems:   Leukopenia ANC is around 900. WBC of 16k this AM Repeat cbc in AM     Hypokalemia Replaced Repeat bmet in AM    Prolonged QT interval Avoid QT prolonging meds if possible. Potassium replaced Will repeat EKG     Hyperlipidemia Currently not on medical therapy. Recommend close f/u with PCP     Depression with anxiety Continue Cymbalta 60 mg p.o. twice daily. Continue trazodone 100 mg p.o. at bedtime PRN.     Hypothyroid Continue levothyroxine 25 mcg p.o. daily.  Lactic acidosis -Likely secondary to presenting severe sepsis -Now resolved after IVF hydration  DVT prophylaxis: Lovenox subq Code Status: Full Family Communication: Pt in room, family is at bedside  Status is: Inpatient  Remains  inpatient appropriate because: severity of illness with severe sepsis, needing IVF, IV antibiotics    Consultants:    Procedures:    Antimicrobials: Anti-infectives (From admission, onward)    Start     Dose/Rate Route Frequency Ordered Stop   09/20/21 1500  vancomycin (VANCOCIN) IVPB 1000 mg/200 mL premix        1,000 mg 200 mL/hr over 60 Minutes Intravenous Every 24 hours 09/19/21 1536 09/27/21 1459   09/20/21 0300  ceFEPIme (MAXIPIME) 2 g in sodium chloride 0.9 % 100 mL IVPB  Status:  Discontinued        2 g 200 mL/hr over 30 Minutes Intravenous Every 12 hours 09/19/21 1535 09/20/21 0138   09/20/21 0230  cefTRIAXone (ROCEPHIN) 2 g in sodium chloride 0.9 % 100 mL IVPB        2 g 200 mL/hr over 30 Minutes Intravenous Every 24 hours 09/20/21 0138     09/20/21 0200  metroNIDAZOLE (FLAGYL) IVPB 500 mg        500 mg 100 mL/hr over 60 Minutes Intravenous Every 12 hours 09/19/21 1526 09/27/21 0159   09/19/21 1415  ceFEPIme (MAXIPIME) 2 g in sodium chloride 0.9 % 100 mL IVPB        2 g 200 mL/hr over 30 Minutes Intravenous  Once 09/19/21 1401 09/19/21 1608   09/19/21 1415  metroNIDAZOLE (FLAGYL) IVPB 500 mg        500 mg 100 mL/hr over 60 Minutes Intravenous  Once 09/19/21 1401 09/19/21 1609   09/19/21 1415  vancomycin (VANCOCIN) IVPB 1000 mg/200 mL premix  1,000 mg 200 mL/hr over 60 Minutes Intravenous  Once 09/19/21 1401 09/19/21 1609       Subjective: Confused, agitated   Objective: Vitals:   09/20/21 1100 09/20/21 1140 09/20/21 1200 09/20/21 1300  BP: 134/73  140/63 126/66  Pulse: 100  96 (!) 104  Resp: 18  17 (!) 21  Temp:  98.4 F (36.9 C)    TempSrc:  Oral    SpO2: 99%  100% 100%  Weight:      Height:        Intake/Output Summary (Last 24 hours) at 09/20/2021 1354 Last data filed at 09/20/2021 0343 Gross per 24 hour  Intake 495.14 ml  Output --  Net 495.14 ml   Filed Weights   09/19/21 1107  Weight: 72.6 kg    Examination: General exam:  Awake, laying in bed Respiratory system: Normal respiratory effort, no audible wheezing Cardiovascular system: regular rate, s1, s2 Gastrointestinal system: Soft, nondistended, positive BS Central nervous system: CN2-12 grossly intact, strength intact Extremities: Perfused, no clubbing Skin: Normal skin turgor, no notable skin lesions seen Psychiatry: difficult to assess given mentation, agitated  Data Reviewed: I have personally reviewed following labs and imaging studies  CBC: Recent Labs  Lab 09/19/21 1132 09/20/21 0535  WBC 1.1* 15.0*  NEUTROABS 0.9* 13.4*  HGB 12.4 10.2*  HCT 38.1 30.6*  MCV 96.2 96.5  PLT 152 147*   Basic Metabolic Panel: Recent Labs  Lab 09/19/21 1132 09/20/21 0251  NA 139 140  K 3.2* 3.8  CL 103 108  CO2 25 21*  GLUCOSE 101* 77  BUN 16 20  CREATININE 0.95 0.99  CALCIUM 9.0 7.7*   GFR: Estimated Creatinine Clearance: 46.5 mL/min (by C-G formula based on SCr of 0.99 mg/dL). Liver Function Tests: Recent Labs  Lab 09/19/21 1132 09/20/21 0251  AST 36 107*  ALT 23 66*  ALKPHOS 113 70  BILITOT 1.2 0.8  PROT 7.2 5.5*  ALBUMIN 3.9 2.7*   No results for input(s): LIPASE, AMYLASE in the last 168 hours. No results for input(s): AMMONIA in the last 168 hours. Coagulation Profile: No results for input(s): INR, PROTIME in the last 168 hours. Cardiac Enzymes: No results for input(s): CKTOTAL, CKMB, CKMBINDEX, TROPONINI in the last 168 hours. BNP (last 3 results) No results for input(s): PROBNP in the last 8760 hours. HbA1C: No results for input(s): HGBA1C in the last 72 hours. CBG: No results for input(s): GLUCAP in the last 168 hours. Lipid Profile: No results for input(s): CHOL, HDL, LDLCALC, TRIG, CHOLHDL, LDLDIRECT in the last 72 hours. Thyroid Function Tests: No results for input(s): TSH, T4TOTAL, FREET4, T3FREE, THYROIDAB in the last 72 hours. Anemia Panel: No results for input(s): VITAMINB12, FOLATE, FERRITIN, TIBC, IRON, RETICCTPCT  in the last 72 hours. Sepsis Labs: Recent Labs  Lab 09/19/21 1618 09/19/21 2000 09/20/21 0859 09/20/21 1150  LATICACIDVEN 4.0* 6.1* 2.5* 1.5    Recent Results (from the past 240 hour(s))  Culture, blood (routine x 2)     Status: Abnormal (Preliminary result)   Collection Time: 09/19/21 11:32 AM   Specimen: BLOOD  Result Value Ref Range Status   Specimen Description   Final    BLOOD LEFT ANTECUBITAL Performed at [facility], [place] [address] [place], [place] [address]    Special Requests   Final    BOTTLES DRAWN AEROBIC AND ANAEROBIC Blood Culture results may not be optimal due to an excessive volume of blood received in culture bottles Performed at [facility], [place] [address] [place], [place] [address]    Culture  Setup Time   Final    GRAM NEGATIVE RODS ANAEROBIC BOTTLE ONLY CRITICAL RESULT CALLED TO, READ BACK BY AND VERIFIED WITH: PHARMD ELLEN JACKSON 09/20/21@1 :322 BY TW IN BOTH AEROBIC AND ANAEROBIC BOTTLES    Culture (A)  Final    ESCHERICHIA COLI CULTURE REINCUBATED FOR BETTER GROWTH Performed at [facility], [place] [address] [place], [place] [address]    Report Status PENDING  Incomplete  Resp Panel by RT-PCR (Flu A&B, Covid) Nasopharyngeal Swab     Status: None   Collection Time: 09/19/21 11:32 AM   Specimen: Nasopharyngeal Swab; Nasopharyngeal(NP) swabs in vial transport medium  Result Value Ref Range Status   SARS Coronavirus 2 by RT PCR NEGATIVE NEGATIVE Final    Comment: (NOTE) SARS-CoV-2 target nucleic acids are NOT DETECTED.  The SARS-CoV-2 RNA is generally detectable in upper respiratory specimens during the acute phase of infection. The lowest concentration of SARS-CoV-2 viral copies this assay can detect is 138 copies/mL. A negative result does not preclude SARS-Cov-2 infection and should not be used as the sole basis for treatment or other patient management decisions. A negative result may occur with   improper specimen collection/handling, submission of specimen other than nasopharyngeal swab, presence of viral mutation(s) within the areas targeted by this assay, and inadequate number of viral copies(<138 copies/mL). A negative result must be combined with clinical observations, patient history, and epidemiological information. The expected result is Negative.  Fact Sheet for Patients:  EntrepreneurPulse.com.au  Fact Sheet for Healthcare Providers:  IncredibleEmployment.be  This test is no t yet approved or cleared by the [place] FDA and  has been authorized for detection and/or diagnosis of SARS-CoV-2 by FDA under an Emergency Use Authorization (EUA). This EUA will remain  in effect (meaning this test can be used) for the duration of the COVID-19 declaration under Section 564(b)(1) of the Act, 21 U.S.C.section 360bbb-3(b)(1), unless the authorization is terminated  or revoked sooner.       Influenza A by PCR NEGATIVE NEGATIVE Final   Influenza B by PCR NEGATIVE NEGATIVE Final    Comment: (NOTE) The Xpert Xpress SARS-CoV-2/FLU/RSV plus assay is intended as an aid in the diagnosis of influenza from Nasopharyngeal swab specimens and should not be used as a sole basis for treatment. Nasal washings and aspirates are unacceptable for Xpert Xpress SARS-CoV-2/FLU/RSV testing.  Fact Sheet for Patients: EntrepreneurPulse.com.au  Fact Sheet for Healthcare Providers: IncredibleEmployment.be  This test is not yet approved or cleared by the [place] FDA and has been authorized for detection and/or diagnosis of SARS-CoV-2 by FDA under an Emergency Use Authorization (EUA). This EUA will remain in effect (meaning this test can be used) for the duration of the COVID-19 declaration under Section 564(b)(1) of the Act, 21 U.S.C. section 360bbb-3(b)(1), unless the authorization is terminated  or revoked.  Performed at [facility], [place] [address] [place] [address], [place],  [address]   Blood Culture ID Panel (Reflexed)     Status: Abnormal   Collection Time: 09/19/21 11:32 AM  Result Value Ref Range Status   Enterococcus faecalis NOT DETECTED NOT DETECTED Final   Enterococcus Faecium NOT DETECTED NOT DETECTED Final   Listeria monocytogenes NOT DETECTED NOT DETECTED Final   Staphylococcus species NOT DETECTED NOT DETECTED Final   Staphylococcus aureus (BCID) NOT DETECTED NOT DETECTED Final   Staphylococcus epidermidis NOT DETECTED NOT DETECTED Final   Staphylococcus lugdunensis NOT DETECTED  NOT DETECTED Final   Streptococcus species NOT DETECTED NOT DETECTED Final   Streptococcus agalactiae NOT DETECTED NOT DETECTED Final   Streptococcus pneumoniae NOT DETECTED NOT DETECTED Final   Streptococcus pyogenes NOT DETECTED NOT DETECTED Final   A.calcoaceticus-baumannii NOT DETECTED NOT DETECTED Final   Bacteroides fragilis NOT DETECTED NOT DETECTED Final   Enterobacterales DETECTED (A) NOT DETECTED Final    Comment: Enterobacterales represent a large order of gram negative bacteria, not a single organism. CRITICAL RESULT CALLED TO, READ BACK BY AND VERIFIED WITH: Luis M. Cintron 09/20/21@1 :322 BY TW    Enterobacter cloacae complex NOT DETECTED NOT DETECTED Final   Escherichia coli DETECTED (A) NOT DETECTED Final    Comment: CRITICAL RESULT CALLED TO, READ BACK BY AND VERIFIED WITH: PHARMD ELLEN JACKSON 09/20/21@1 :322 BY TW    Klebsiella aerogenes NOT DETECTED NOT DETECTED Final   Klebsiella oxytoca NOT DETECTED NOT DETECTED Final   Klebsiella pneumoniae NOT DETECTED NOT DETECTED Final   Proteus species NOT DETECTED NOT DETECTED Final   Salmonella species NOT DETECTED NOT DETECTED Final   Serratia marcescens NOT DETECTED NOT DETECTED Final   Haemophilus influenzae NOT DETECTED NOT DETECTED Final   Neisseria meningitidis NOT DETECTED NOT DETECTED Final    Pseudomonas aeruginosa NOT DETECTED NOT DETECTED Final   Stenotrophomonas maltophilia NOT DETECTED NOT DETECTED Final   Candida albicans NOT DETECTED NOT DETECTED Final   Candida auris NOT DETECTED NOT DETECTED Final   Candida glabrata NOT DETECTED NOT DETECTED Final   Candida krusei NOT DETECTED NOT DETECTED Final   Candida parapsilosis NOT DETECTED NOT DETECTED Final   Candida tropicalis NOT DETECTED NOT DETECTED Final   Cryptococcus neoformans/gattii NOT DETECTED NOT DETECTED Final   CTX-M ESBL NOT DETECTED NOT DETECTED Final   Carbapenem resistance IMP NOT DETECTED NOT DETECTED Final   Carbapenem resistance KPC NOT DETECTED NOT DETECTED Final   Carbapenem resistance NDM NOT DETECTED NOT DETECTED Final   Carbapenem resist OXA 48 LIKE NOT DETECTED NOT DETECTED Final   Carbapenem resistance VIM NOT DETECTED NOT DETECTED Final    Comment: Performed at [facility], [address], [place], [place] [address]  Culture, blood (routine x 2)     Status: None (Preliminary result)   Collection Time: 09/19/21  1:20 PM   Specimen: BLOOD  Result Value Ref Range Status   Specimen Description   Final    BLOOD BLOOD RIGHT WRIST Performed at [place] [address], [place], [place] [address]    Special Requests   Final    BOTTLES DRAWN AEROBIC AND ANAEROBIC Blood Culture adequate volume Performed at [place] [address], [place], [place] [address]    Culture  Setup Time   Final    GRAM NEGATIVE RODS ANAEROBIC BOTTLE ONLY CRITICAL VALUE NOTED.  VALUE IS CONSISTENT WITH PREVIOUSLY REPORTED AND CALLED VALUE. IN BOTH AEROBIC AND ANAEROBIC BOTTLES Performed at [facility], [place] [address] [place], [place] [address]    Culture PENDING  Incomplete   Report Status PENDING  Incomplete  MRSA Next Gen by PCR, Nasal     Status: None   Collection Time: 09/19/21  9:55 PM   Specimen: Nasal Mucosa; Nasal Swab  Result Value Ref Range Status    MRSA by PCR Next Gen NOT DETECTED NOT DETECTED Final    Comment: (NOTE) The GeneXpert MRSA Assay (FDA approved for NASAL specimens only), is one component of a comprehensive MRSA colonization surveillance program. It  is not intended to diagnose MRSA infection nor to guide or monitor treatment for MRSA infections. Test performance is not FDA approved in patients less than 94 years old. Performed at [facility], [place] [address], [place], [place] [address]      Radiology Studies: [facility] Chest Port 1 View  Result Date: 09/19/2021 CLINICAL DATA:  Possible sepsis EXAM: PORTABLE CHEST 1 VIEW COMPARISON:  Chest x-ray 06/25/2021 FINDINGS: Heart size is normal. Mediastinum appears stable. Calcified plaques in the aortic arch. No focal consolidation identified. No pleural effusion or pneumothorax visualized. IMPRESSION: No acute intrathoracic process identified. Electronically Signed   By: Ofilia Neas M.D.   On: 09/19/2021 12:35    Scheduled Meds:  ARIPiprazole  10 mg Oral Daily   aspirin  81 mg Oral Daily   Chlorhexidine Gluconate Cloth  6 each Topical Q0600   DULoxetine  60 mg Oral BID   enoxaparin (LOVENOX) injection  40 mg Subcutaneous Q24H   gabapentin  600 mg Oral BID   levothyroxine  25 mcg Oral QAC breakfast   pravastatin  40 mg Oral Daily   Continuous Infusions:  cefTRIAXone (ROCEPHIN)  IV Stopped (09/20/21 0256)   metronidazole 500 mg (09/20/21 1324)   vancomycin       LOS: 1 day   Marylu Lund, MD [facility] Pager On Amion  If 7PM-7AM, please contact night-coverage 09/20/2021, 1:54 PM

## 2021-09-21 ENCOUNTER — Encounter: Payer: Medicare HMO | Admitting: Physical Therapy

## 2021-09-21 ENCOUNTER — Telehealth: Payer: Self-pay | Admitting: Physical Therapy

## 2021-09-21 DIAGNOSIS — A419 Sepsis, unspecified organism: Secondary | ICD-10-CM | POA: Diagnosis not present

## 2021-09-21 DIAGNOSIS — R4182 Altered mental status, unspecified: Secondary | ICD-10-CM | POA: Diagnosis not present

## 2021-09-21 LAB — GLUCOSE, CAPILLARY
Glucose-Capillary: 126 mg/dL — ABNORMAL HIGH (ref 70–99)
Glucose-Capillary: 85 mg/dL (ref 70–99)
Glucose-Capillary: 67 mg/dL — ABNORMAL LOW (ref 70–99)
Glucose-Capillary: 114 mg/dL — ABNORMAL HIGH (ref 70–99)
Glucose-Capillary: 116 mg/dL — ABNORMAL HIGH (ref 70–99)
Glucose-Capillary: 135 mg/dL — ABNORMAL HIGH (ref 70–99)

## 2021-09-21 LAB — COMPREHENSIVE METABOLIC PANEL
ALT: 62 U/L — ABNORMAL HIGH (ref 0–44)
AST: 91 U/L — ABNORMAL HIGH (ref 15–41)
Albumin: 2.8 g/dL — ABNORMAL LOW (ref 3.5–5.0)
Alkaline Phosphatase: 76 U/L (ref 38–126)
Anion gap: 12 (ref 5–15)
BUN: 23 mg/dL (ref 8–23)
CO2: 23 mmol/L (ref 22–32)
Calcium: 8.3 mg/dL — ABNORMAL LOW (ref 8.9–10.3)
Chloride: 110 mmol/L (ref 98–111)
Creatinine, Ser: 0.8 mg/dL (ref 0.44–1.00)
GFR, Estimated: >60 mL/min (ref >60)
Glucose, Bld: 55 mg/dL — ABNORMAL LOW (ref 70–99)
Potassium: 4.2 mmol/L (ref 3.5–5.1)
Sodium: 145 mmol/L (ref 135–145)
Total Bilirubin: 0.8 mg/dL (ref 0.3–1.2)
Total Protein: 5.9 g/dL — ABNORMAL LOW (ref 6.5–8.1)

## 2021-09-21 LAB — CBC
HCT: 32.4 % — ABNORMAL LOW (ref 36.0–46.0)
Hemoglobin: 10.3 g/dL — ABNORMAL LOW (ref 12.0–15.0)
MCH: 31.2 pg (ref 26.0–34.0)
MCHC: 31.8 g/dL (ref 30.0–36.0)
MCV: 98.2 fL (ref 80.0–100.0)
Platelets: 110 10*3/uL — ABNORMAL LOW (ref 150–400)
RBC: 3.3 MIL/uL — ABNORMAL LOW (ref 3.87–5.11)
RDW: 15.8 % — ABNORMAL HIGH (ref 11.5–15.5)
WBC: 14.5 10*3/uL — ABNORMAL HIGH (ref 4.0–10.5)
nRBC: 0 % (ref 0.0–0.2)

## 2021-09-21 MED ORDER — DEXTROSE 50 % IV SOLN
50.0000 mL | Freq: Once | INTRAVENOUS | Status: AC
  Administered 2021-09-21: 50 mL via INTRAVENOUS

## 2021-09-21 MED ORDER — DEXTROSE 50 % IV SOLN
INTRAVENOUS | Status: AC
  Administered 2021-09-21: 25 mL via INTRAVENOUS
  Filled 2021-09-21: qty 50

## 2021-09-21 MED ORDER — DEXTROSE 50 % IV SOLN
INTRAVENOUS | Status: AC
  Filled 2021-09-21: qty 50

## 2021-09-21 MED ORDER — DEXTROSE 10 % IV SOLN
INTRAVENOUS | Status: DC
Start: 2021-09-21 — End: 2021-09-22

## 2021-09-21 MED ORDER — DEXTROSE 50 % IV SOLN: 25.0000 mL | Freq: Once | INTRAVENOUS | Status: AC

## 2021-09-21 NOTE — Progress Notes (Signed)
PROGRESS NOTE    [name]  EXH:[id number] DOB: 03-14-1946 DOA: 09/19/2021 PCP: [name], MD    Brief Narrative:  75 y.o. female with medical history significant of anal cancer, anxiety, osteoarthritis, depression, hyperlipidemia, history of rectal bleeding, history of Staphylococcus bacteremia with sepsis who is coming to the emergency department due to altered mental status.  She is normally fully oriented. Pt was found to be floridly septic with UTI and later ecoli bacteremia  Assessment & Plan:   Principal Problem:   Sepsis due to undetermined organism [facility]) Active Problems:   Hyperlipidemia   Depression   Anxiety   Acute metabolic encephalopathy   Hypothyroid   Hypokalemia   Leukopenia   Prolonged QT interval  Principal Problem:   Acute metabolic encephalopathy secondary to severe Sepsis secondary to ecoli bacteremia with UTI present on admit -Presented floridly septic with leukocytosis, lactic acidosis, tachycardia, tachypena -Blood cx pos for ecoli, pending sensitivities -Urine cx was ordered, still pending. As pt is already on abx, doubt usefulness of urine culture at this point -Pt is continued on cefempime, flagyl, and vanc. Have narrowed to rocephin monotherapy pending sensitivities -Recheck cbc and bmet in AM   Active Problems:   Leukopenia ANC is around 900. WBC down to 14.5k today Recheck cbc in AM     Hypokalemia Replaced and normal today Repeat bmet in AM    Prolonged QT interval Avoid QT prolonging meds if possible. F/u on repeat ekg     Hyperlipidemia Currently not on medical therapy. Recommend close f/u with PCP     Depression with anxiety Continue Cymbalta 60 mg p.o. twice daily. Continue trazodone 100 mg p.o. at bedtime PRN.     Hypothyroid Continue levothyroxine 25 mcg p.o. daily.  Lactic acidosis -Likely secondary to presenting severe sepsis -Now resolved after IVF hydration  DVT prophylaxis: Lovenox subq Code Status:  Full Family Communication: Pt in room, family is at bedside  Status is: Inpatient  Remains inpatient appropriate because: severity of illness with severe sepsis, needing IVF, IV antibiotics    Consultants:    Procedures:    Antimicrobials: Anti-infectives (From admission, onward)    Start     Dose/Rate Route Frequency Ordered Stop   09/20/21 1500  vancomycin (VANCOCIN) IVPB 1000 mg/200 mL premix  Status:  Discontinued        1,000 mg 200 mL/hr over 60 Minutes Intravenous Every 24 hours 09/19/21 1536 09/21/21 0734   09/20/21 0300  ceFEPIme (MAXIPIME) 2 g in sodium chloride 0.9 % 100 mL IVPB  Status:  Discontinued        2 g 200 mL/hr over 30 Minutes Intravenous Every 12 hours 09/19/21 1535 09/20/21 0138   09/20/21 0230  cefTRIAXone (ROCEPHIN) 2 g in sodium chloride 0.9 % 100 mL IVPB        2 g 200 mL/hr over 30 Minutes Intravenous Every 24 hours 09/20/21 0138     09/20/21 0200  metroNIDAZOLE (FLAGYL) IVPB 500 mg  Status:  Discontinued        500 mg 100 mL/hr over 60 Minutes Intravenous Every 12 hours 09/19/21 1526 09/21/21 0734   09/19/21 1415  ceFEPIme (MAXIPIME) 2 g in sodium chloride 0.9 % 100 mL IVPB        2 g 200 mL/hr over 30 Minutes Intravenous  Once 09/19/21 1401 09/19/21 1608   09/19/21 1415  metroNIDAZOLE (FLAGYL) IVPB 500 mg        500 mg 100 mL/hr over 60 Minutes Intravenous  Once 09/19/21 1401 09/19/21 1609   09/19/21 1415  vancomycin (VANCOCIN) IVPB 1000 mg/200 mL premix        1,000 mg 200 mL/hr over 60 Minutes Intravenous  Once 09/19/21 1401 09/19/21 1609       Subjective: Remains confused, unable to assess  Objective: Vitals:   09/21/21 0300 09/21/21 0400 09/21/21 0840 09/21/21 1125  BP:  129/66    Pulse:  98    Resp: 19 16    Temp:  99 F (37.2 C) 98.6 F (37 C) 98.4 F (36.9 C)  TempSrc:  Axillary Axillary Axillary  SpO2:  100%    Weight:      Height:       No intake or output data in the 24 hours ending 09/21/21 1441  Filed Weights    09/19/21 1107  Weight: 72.6 kg    Examination: General exam: Not conversant, in no acute distress, in restraints Respiratory system: normal chest rise, clear, no audible wheezing Cardiovascular system: regular rhythm, s1-s2 Gastrointestinal system: Nondistended, nontender, pos BS Central nervous system: No seizures, no tremors Extremities: No cyanosis, no joint deformities Skin: No rashes, no pallor Psychiatry: Unable to assess given mentation  Data Reviewed: I have personally reviewed following labs and imaging studies  CBC: Recent Labs  Lab 09/19/21 1132 09/20/21 0535 09/21/21 0309  WBC 1.1* 15.0* 14.5*  NEUTROABS 0.9* 13.4*  --   HGB 12.4 10.2* 10.3*  HCT 38.1 30.6* 32.4*  MCV 96.2 96.5 98.2  PLT 152 141* 110*    Basic Metabolic Panel: Recent Labs  Lab 09/19/21 1132 09/20/21 0251 09/21/21 0309  NA 139 140 145  K 3.2* 3.8 4.2  CL 103 108 110  CO2 25 21* 23  GLUCOSE 101* 77 55*  BUN 16 20 23   CREATININE 0.95 0.99 0.80  CALCIUM 9.0 7.7* 8.3*    GFR: Estimated Creatinine Clearance: 57.6 mL/min (by C-G formula based on SCr of 0.8 mg/dL). Liver Function Tests: Recent Labs  Lab 09/19/21 1132 09/20/21 0251 09/21/21 0309  AST 36 107* 91*  ALT 23 66* 62*  ALKPHOS 113 70 76  BILITOT 1.2 0.8 0.8  PROT 7.2 5.5* 5.9*  ALBUMIN 3.9 2.7* 2.8*    No results for input(s): LIPASE, AMYLASE in the last 168 hours. No results for input(s): AMMONIA in the last 168 hours. Coagulation Profile: No results for input(s): INR, PROTIME in the last 168 hours. Cardiac Enzymes: No results for input(s): CKTOTAL, CKMB, CKMBINDEX, TROPONINI in the last 168 hours. BNP (last 3 results) No results for input(s): PROBNP in the last 8760 hours. HbA1C: No results for input(s): HGBA1C in the last 72 hours. CBG: Recent Labs  Lab 09/21/21 0818 09/21/21 1118  GLUCAP 126* 85   Lipid Profile: No results for input(s): CHOL, HDL, LDLCALC, TRIG, CHOLHDL, LDLDIRECT in the last 72  hours. Thyroid Function Tests: No results for input(s): TSH, T4TOTAL, FREET4, T3FREE, THYROIDAB in the last 72 hours. Anemia Panel: No results for input(s): VITAMINB12, FOLATE, FERRITIN, TIBC, IRON, RETICCTPCT in the last 72 hours. Sepsis Labs: Recent Labs  Lab 09/19/21 1618 09/19/21 2000 09/20/21 0859 09/20/21 1150  LATICACIDVEN 4.0* 6.1* 2.5* 1.5     Recent Results (from the past 240 hour(s))  Culture, blood (routine x 2)     Status: Abnormal (Preliminary result)   Collection Time: 09/19/21 11:32 AM   Specimen: BLOOD  Result Value Ref Range Status   Specimen Description   Final    BLOOD LEFT ANTECUBITAL Performed at [facility]  [facility], [place] [address], [place], [place] [address]    Special Requests   Final    BOTTLES DRAWN AEROBIC AND ANAEROBIC Blood Culture results may not be optimal due to an excessive volume of blood received in culture bottles Performed at [place] [address] [place], [place] [address]    Culture  Setup Time   Final    GRAM NEGATIVE RODS ANAEROBIC BOTTLE ONLY CRITICAL RESULT CALLED TO, READ BACK BY AND VERIFIED WITH: PHARMD ELLEN JACKSON 09/20/21@1 :322 BY TW IN BOTH AEROBIC AND ANAEROBIC BOTTLES    Culture (A)  Final    ESCHERICHIA COLI SUSCEPTIBILITIES TO FOLLOW Performed at [facility], [place] [address], [place], [place] [address]    Report Status PENDING  Incomplete  Resp Panel by RT-PCR (Flu A&B, Covid) Nasopharyngeal Swab     Status: None   Collection Time: 09/19/21 11:32 AM   Specimen: Nasopharyngeal Swab; Nasopharyngeal(NP) swabs in vial transport medium  Result Value Ref Range Status   SARS Coronavirus 2 by RT PCR NEGATIVE NEGATIVE Final    Comment: (NOTE) SARS-CoV-2 target nucleic acids are NOT DETECTED.  The SARS-CoV-2 RNA is generally detectable in upper respiratory specimens during the acute phase of infection. The lowest concentration of SARS-CoV-2 viral copies this assay can detect  is 138 copies/mL. A negative result does not preclude SARS-Cov-2 infection and should not be used as the sole basis for treatment or other patient management decisions. A negative result may occur with  improper specimen collection/handling, submission of specimen other than nasopharyngeal swab, presence of viral mutation(s) within the areas targeted by this assay, and inadequate number of viral copies(<138 copies/mL). A negative result must be combined with clinical observations, patient history, and epidemiological information. The expected result is Negative.  Fact Sheet for Patients:  EntrepreneurPulse.com.au  Fact Sheet for Healthcare Providers:  IncredibleEmployment.be  This test is no t yet approved or cleared by the [place] FDA and  has been authorized for detection and/or diagnosis of SARS-CoV-2 by FDA under an Emergency Use Authorization (EUA). This EUA will remain  in effect (meaning this test can be used) for the duration of the COVID-19 declaration under Section 564(b)(1) of the Act, 21 U.S.C.section 360bbb-3(b)(1), unless the authorization is terminated  or revoked sooner.       Influenza A by PCR NEGATIVE NEGATIVE Final   Influenza B by PCR NEGATIVE NEGATIVE Final    Comment: (NOTE) The Xpert Xpress SARS-CoV-2/FLU/RSV plus assay is intended as an aid in the diagnosis of influenza from Nasopharyngeal swab specimens and should not be used as a sole basis for treatment. Nasal washings and aspirates are unacceptable for Xpert Xpress SARS-CoV-2/FLU/RSV testing.  Fact Sheet for Patients: EntrepreneurPulse.com.au  Fact Sheet for Healthcare Providers: IncredibleEmployment.be  This test is not yet approved or cleared by the [place] FDA and has been authorized for detection and/or diagnosis of SARS-CoV-2 by FDA under an Emergency Use Authorization (EUA). This EUA will remain in effect  (meaning this test can be used) for the duration of the COVID-19 declaration under Section 564(b)(1) of the Act, 21 U.S.C. section 360bbb-3(b)(1), unless the authorization is terminated or revoked.  Performed at [facility], [place] [address], [place], [place] [address]   Blood Culture ID Panel (Reflexed)     Status: Abnormal   Collection Time: 09/19/21 11:32 AM  Result Value Ref Range Status   Enterococcus faecalis NOT DETECTED NOT DETECTED Final   Enterococcus Faecium NOT DETECTED NOT  DETECTED Final   Listeria monocytogenes NOT DETECTED NOT DETECTED Final   Staphylococcus species NOT DETECTED NOT DETECTED Final   Staphylococcus aureus (BCID) NOT DETECTED NOT DETECTED Final   Staphylococcus epidermidis NOT DETECTED NOT DETECTED Final   Staphylococcus lugdunensis NOT DETECTED NOT DETECTED Final   Streptococcus species NOT DETECTED NOT DETECTED Final   Streptococcus agalactiae NOT DETECTED NOT DETECTED Final   Streptococcus pneumoniae NOT DETECTED NOT DETECTED Final   Streptococcus pyogenes NOT DETECTED NOT DETECTED Final   A.calcoaceticus-baumannii NOT DETECTED NOT DETECTED Final   Bacteroides fragilis NOT DETECTED NOT DETECTED Final   Enterobacterales DETECTED (A) NOT DETECTED Final    Comment: Enterobacterales represent a large order of gram negative bacteria, not a single organism. CRITICAL RESULT CALLED TO, READ BACK BY AND VERIFIED WITH: [place] 09/20/21@1 :322 BY TW    Enterobacter cloacae complex NOT DETECTED NOT DETECTED Final   Escherichia coli DETECTED (A) NOT DETECTED Final    Comment: CRITICAL RESULT CALLED TO, READ BACK BY AND VERIFIED WITH: PHARMD ELLEN JACKSON 09/20/21@1 :322 BY TW    Klebsiella aerogenes NOT DETECTED NOT DETECTED Final   Klebsiella oxytoca NOT DETECTED NOT DETECTED Final   Klebsiella pneumoniae NOT DETECTED NOT DETECTED Final   Proteus species NOT DETECTED NOT DETECTED Final   Salmonella species NOT DETECTED NOT  DETECTED Final   Serratia marcescens NOT DETECTED NOT DETECTED Final   Haemophilus influenzae NOT DETECTED NOT DETECTED Final   Neisseria meningitidis NOT DETECTED NOT DETECTED Final   Pseudomonas aeruginosa NOT DETECTED NOT DETECTED Final   Stenotrophomonas maltophilia NOT DETECTED NOT DETECTED Final   Candida albicans NOT DETECTED NOT DETECTED Final   Candida auris NOT DETECTED NOT DETECTED Final   Candida glabrata NOT DETECTED NOT DETECTED Final   Candida krusei NOT DETECTED NOT DETECTED Final   Candida parapsilosis NOT DETECTED NOT DETECTED Final   Candida tropicalis NOT DETECTED NOT DETECTED Final   Cryptococcus neoformans/gattii NOT DETECTED NOT DETECTED Final   CTX-M ESBL NOT DETECTED NOT DETECTED Final   Carbapenem resistance IMP NOT DETECTED NOT DETECTED Final   Carbapenem resistance KPC NOT DETECTED NOT DETECTED Final   Carbapenem resistance NDM NOT DETECTED NOT DETECTED Final   Carbapenem resist OXA 48 LIKE NOT DETECTED NOT DETECTED Final   Carbapenem resistance VIM NOT DETECTED NOT DETECTED Final    Comment: Performed at [facility], [address], [place], [place] [address]  Culture, blood (routine x 2)     Status: Abnormal (Preliminary result)   Collection Time: 09/19/21  1:20 PM   Specimen: BLOOD  Result Value Ref Range Status   Specimen Description   Final    BLOOD BLOOD RIGHT WRIST Performed at [facility] [address] [place], [place] [address]    Special Requests   Final    BOTTLES DRAWN AEROBIC AND ANAEROBIC Blood Culture adequate volume Performed at [facility] [address] [place], [place] [address]    Culture  Setup Time   Final    GRAM NEGATIVE RODS ANAEROBIC BOTTLE ONLY CRITICAL VALUE NOTED.  VALUE IS CONSISTENT WITH PREVIOUSLY REPORTED AND CALLED VALUE. IN BOTH AEROBIC AND ANAEROBIC BOTTLES Performed at [facility], [place] [address], [place], [place] [address]    Culture ESCHERICHIA COLI (A)   Final   Report Status PENDING  Incomplete  MRSA Next Gen by PCR, Nasal     Status: None   Collection Time: 09/19/21  9:55 PM   Specimen: Nasal  Mucosa; Nasal Swab  Result Value Ref Range Status   MRSA by PCR Next Gen NOT DETECTED NOT DETECTED Final    Comment: (NOTE) The GeneXpert MRSA Assay (FDA approved for NASAL specimens only), is one component of a comprehensive MRSA colonization surveillance program. It is not intended to diagnose MRSA infection nor to guide or monitor treatment for MRSA infections. Test performance is not FDA approved in patients less than 56 years old. Performed at [facility], [place] [address], [place], [place] [address]       Radiology Studies: No results found.  Scheduled Meds:  ARIPiprazole  10 mg Oral Daily   aspirin  81 mg Oral Daily   Chlorhexidine Gluconate Cloth  6 each Topical Q0600   DULoxetine  60 mg Oral BID   enoxaparin (LOVENOX) injection  40 mg Subcutaneous Q24H   gabapentin  600 mg Oral BID   levothyroxine  25 mcg Oral QAC breakfast   pravastatin  40 mg Oral Daily   Continuous Infusions:  cefTRIAXone (ROCEPHIN)  IV Stopped (09/21/21 0324)     LOS: 2 days   Marylu Lund, MD [facility] Pager On Amion  If 7PM-7AM, please contact night-coverage 09/21/2021, 2:41 PM

## 2021-09-21 NOTE — Telephone Encounter (Signed)
I called to check on pt after her missed physical therapy appointment today at 1:00 pm. Pt's mailbox was full and I was unable to leave a message.  Kearney Hard, PT, MPT 09/21/21 1:30 PM

## 2021-09-22 DIAGNOSIS — R4182 Altered mental status, unspecified: Secondary | ICD-10-CM | POA: Diagnosis not present

## 2021-09-22 DIAGNOSIS — A419 Sepsis, unspecified organism: Secondary | ICD-10-CM | POA: Diagnosis not present

## 2021-09-22 LAB — CULTURE, BLOOD (ROUTINE X 2): Special Requests: ADEQUATE

## 2021-09-22 LAB — GLUCOSE, CAPILLARY
Glucose-Capillary: 81 mg/dL (ref 70–99)
Glucose-Capillary: 120 mg/dL — ABNORMAL HIGH (ref 70–99)
Glucose-Capillary: 83 mg/dL (ref 70–99)
Glucose-Capillary: 82 mg/dL (ref 70–99)
Glucose-Capillary: 93 mg/dL (ref 70–99)

## 2021-09-22 LAB — COMPREHENSIVE METABOLIC PANEL
ALT: 47 U/L — ABNORMAL HIGH (ref 0–44)
AST: 41 U/L (ref 15–41)
Albumin: 2.8 g/dL — ABNORMAL LOW (ref 3.5–5.0)
Alkaline Phosphatase: 80 U/L (ref 38–126)
Anion gap: 6 (ref 5–15)
BUN: 20 mg/dL (ref 8–23)
CO2: 27 mmol/L (ref 22–32)
Calcium: 8.3 mg/dL — ABNORMAL LOW (ref 8.9–10.3)
Chloride: 111 mmol/L (ref 98–111)
Creatinine, Ser: 0.56 mg/dL (ref 0.44–1.00)
GFR, Estimated: >60 mL/min (ref >60)
Glucose, Bld: 128 mg/dL — ABNORMAL HIGH (ref 70–99)
Potassium: 3.2 mmol/L — ABNORMAL LOW (ref 3.5–5.1)
Sodium: 144 mmol/L (ref 135–145)
Total Bilirubin: 0.6 mg/dL (ref 0.3–1.2)
Total Protein: 5.9 g/dL — ABNORMAL LOW (ref 6.5–8.1)

## 2021-09-22 LAB — CBC
HCT: 30.6 % — ABNORMAL LOW (ref 36.0–46.0)
Hemoglobin: 9.7 g/dL — ABNORMAL LOW (ref 12.0–15.0)
MCH: 31 pg (ref 26.0–34.0)
MCHC: 31.7 g/dL (ref 30.0–36.0)
MCV: 97.8 fL (ref 80.0–100.0)
Platelets: 136 10*3/uL — ABNORMAL LOW (ref 150–400)
RBC: 3.13 MIL/uL — ABNORMAL LOW (ref 3.87–5.11)
RDW: 15.9 % — ABNORMAL HIGH (ref 11.5–15.5)
WBC: 10.2 10*3/uL (ref 4.0–10.5)
nRBC: 0 % (ref 0.0–0.2)

## 2021-09-22 MED ORDER — POTASSIUM CHLORIDE CRYS ER 20 MEQ PO TBCR
40.0000 meq | EXTENDED_RELEASE_TABLET | Freq: Two times a day (BID) | ORAL | Status: AC
  Administered 2021-09-22 (×2): 40 meq via ORAL
  Filled 2021-09-22 (×2): qty 2

## 2021-09-22 NOTE — Progress Notes (Signed)
09/22/21  1230  Restraints removed and mitten applied to both hands.

## 2021-09-22 NOTE — Progress Notes (Signed)
PROGRESS NOTE    [name]  NFA:[id number] DOB: December 20, 1945 DOA: 09/19/2021 PCP: [name], MD    Brief Narrative:  75 y.o. female with medical history significant of anal cancer, anxiety, osteoarthritis, depression, hyperlipidemia, history of rectal bleeding, history of Staphylococcus bacteremia with sepsis who is coming to the emergency department due to altered mental status.  She is normally fully oriented. Pt was found to be floridly septic with UTI and later ecoli bacteremia  Assessment & Plan:   Principal Problem:   Sepsis due to undetermined organism [facility]) Active Problems:   Hyperlipidemia   Depression   Anxiety   Acute metabolic encephalopathy   Hypothyroid   Hypokalemia   Leukopenia   Prolonged QT interval  Principal Problem:   Acute metabolic encephalopathy secondary to severe Sepsis secondary to ecoli bacteremia with UTI present on admit -Presented floridly septic with leukocytosis, lactic acidosis, tachycardia, tachypena -Blood cx pos for ecoli, pending sensitivities resistant to ampicillin, intermediate resistance to ampicillin/sulbactam, otherwise sensitive to all other antibiotics -Urine cx was ordered, still pending. As pt is already on abx, doubt usefulness of urine culture at this point -Broad-spectrum antibiotics have since been narrowed to Rocephin. -Repeat CBC and basic metabolic panel in the morning   Active Problems:   Leukopenia Presenting ANC is around 900. WBC has since normalized to 10.2 today Recheck cbc in AM      Hypokalemia Low at 3.2, will replace Repeat bmet in AM    Prolonged QT interval Avoid QT prolonging meds if possible.     Hyperlipidemia Currently not on medical therapy. Recommend close f/u with PCP     Depression with anxiety Continue Cymbalta 60 mg p.o. twice daily. Continue with trazodone 100 mg p.o. at bedtime PRN.     Hypothyroid Continue levothyroxine 25 mcg p.o. daily. Most recent TSH from June 27, 2021 noted to be 3.116  Lactic acidosis -Likely secondary to presenting severe sepsis -Now resolved after IVF hydration  DVT prophylaxis: Lovenox subq Code Status: Full Family Communication: Pt in room, family currently not at bedside  Status is: Inpatient  Remains inpatient appropriate because: severity of illness with severe sepsis, needing IVF, IV antibiotics    Consultants:    Procedures:    Antimicrobials: Anti-infectives (From admission, onward)    Start     Dose/Rate Route Frequency Ordered Stop   09/20/21 1500  vancomycin (VANCOCIN) IVPB 1000 mg/200 mL premix  Status:  Discontinued        1,000 mg 200 mL/hr over 60 Minutes Intravenous Every 24 hours 09/19/21 1536 09/21/21 0734   09/20/21 0300  ceFEPIme (MAXIPIME) 2 g in sodium chloride 0.9 % 100 mL IVPB  Status:  Discontinued        2 g 200 mL/hr over 30 Minutes Intravenous Every 12 hours 09/19/21 1535 09/20/21 0138   09/20/21 0230  cefTRIAXone (ROCEPHIN) 2 g in sodium chloride 0.9 % 100 mL IVPB        2 g 200 mL/hr over 30 Minutes Intravenous Every 24 hours 09/20/21 0138     09/20/21 0200  metroNIDAZOLE (FLAGYL) IVPB 500 mg  Status:  Discontinued        500 mg 100 mL/hr over 60 Minutes Intravenous Every 12 hours 09/19/21 1526 09/21/21 0734   09/19/21 1415  ceFEPIme (MAXIPIME) 2 g in sodium chloride 0.9 % 100 mL IVPB        2 g 200 mL/hr over 30 Minutes Intravenous  Once 09/19/21 1401 09/19/21 1608   09/19/21  1415  metroNIDAZOLE (FLAGYL) IVPB 500 mg        500 mg 100 mL/hr over 60 Minutes Intravenous  Once 09/19/21 1401 09/19/21 1609   09/19/21 1415  vancomycin (VANCOCIN) IVPB 1000 mg/200 mL premix        1,000 mg 200 mL/hr over 60 Minutes Intravenous  Once 09/19/21 1401 09/19/21 1609       Subjective: More awake and conversant today  Objective: Vitals:   09/21/21 2016 09/22/21 0330 09/22/21 0800 09/22/21 1227  BP: (!) 144/80 (!) 143/97 (!) 141/85 124/75  Pulse: 93 74 88 91  Resp: 20 20 16    Temp:  98.6 F (37 C) 98.4 F (36.9 C) 98.1 F (36.7 C) 99.2 F (37.3 C)  TempSrc: Axillary Axillary Axillary Axillary  SpO2: 100% 100% 100% 100%  Weight:      Height:        Intake/Output Summary (Last 24 hours) at 09/22/2021 1326 Last data filed at 09/22/2021 0900 Gross per 24 hour  Intake 1016.6 ml  Output 400 ml  Net 616.6 ml    Filed Weights   09/19/21 1107  Weight: 72.6 kg    Examination: General exam: Conversant, in no acute distress Respiratory system: normal chest rise, clear, no audible wheezing Cardiovascular system: regular rhythm, s1-s2 Gastrointestinal system: Nondistended, nontender, pos BS Central nervous system: No seizures, no tremors Extremities: No cyanosis, no joint deformities Skin: No rashes, no pallor Psychiatry: Affect normal // no auditory hallucinations   Data Reviewed: I have personally reviewed following labs and imaging studies  CBC: Recent Labs  Lab 09/19/21 1132 09/20/21 0535 09/21/21 0309 09/22/21 0444  WBC 1.1* 15.0* 14.5* 10.2  NEUTROABS 0.9* 13.4*  --   --   HGB 12.4 10.2* 10.3* 9.7*  HCT 38.1 30.6* 32.4* 30.6*  MCV 96.2 96.5 98.2 97.8  PLT 152 141* 110* 136*    Basic Metabolic Panel: Recent Labs  Lab 09/19/21 1132 09/20/21 0251 09/21/21 0309 09/22/21 0444  NA 139 140 145 144  K 3.2* 3.8 4.2 3.2*  CL 103 108 110 111  CO2 25 21* 23 27  GLUCOSE 101* 77 55* 128*  BUN 16 20 23 20   CREATININE 0.95 0.99 0.80 0.56  CALCIUM 9.0 7.7* 8.3* 8.3*    GFR: Estimated Creatinine Clearance: 57.6 mL/min (by C-G formula based on SCr of 0.56 mg/dL). Liver Function Tests: Recent Labs  Lab 09/19/21 1132 09/20/21 0251 09/21/21 0309 09/22/21 0444  AST 36 107* 91* 41  ALT 23 66* 62* 47*  ALKPHOS 113 70 76 80  BILITOT 1.2 0.8 0.8 0.6  PROT 7.2 5.5* 5.9* 5.9*  ALBUMIN 3.9 2.7* 2.8* 2.8*    No results for input(s): LIPASE, AMYLASE in the last 168 hours. No results for input(s): AMMONIA in the last 168 hours. Coagulation  Profile: No results for input(s): INR, PROTIME in the last 168 hours. Cardiac Enzymes: No results for input(s): CKTOTAL, CKMB, CKMBINDEX, TROPONINI in the last 168 hours. BNP (last 3 results) No results for input(s): PROBNP in the last 8760 hours. HbA1C: No results for input(s): HGBA1C in the last 72 hours. CBG: Recent Labs  Lab 09/21/21 2019 09/21/21 2303 09/22/21 0329 09/22/21 0729 09/22/21 1147  GLUCAP 116* 135* 81 120* 83    Lipid Profile: No results for input(s): CHOL, HDL, LDLCALC, TRIG, CHOLHDL, LDLDIRECT in the last 72 hours. Thyroid Function Tests: No results for input(s): TSH, T4TOTAL, FREET4, T3FREE, THYROIDAB in the last 72 hours. Anemia Panel: No results for input(s): VITAMINB12, FOLATE, FERRITIN,  TIBC, IRON, RETICCTPCT in the last 72 hours. Sepsis Labs: Recent Labs  Lab 09/19/21 1618 09/19/21 2000 09/20/21 0859 09/20/21 1150  LATICACIDVEN 4.0* 6.1* 2.5* 1.5     Recent Results (from the past 240 hour(s))  Culture, blood (routine x 2)     Status: Abnormal   Collection Time: 09/19/21 11:32 AM   Specimen: BLOOD  Result Value Ref Range Status   Specimen Description   Final    BLOOD LEFT ANTECUBITAL Performed at [facility], [place] [address], [place], [place] [address]    Special Requests   Final    BOTTLES DRAWN AEROBIC AND ANAEROBIC Blood Culture results may not be optimal due to an excessive volume of blood received in culture bottles Performed at [place] [address] [place], [place] [address]    Culture  Setup Time   Final    GRAM NEGATIVE RODS ANAEROBIC BOTTLE ONLY CRITICAL RESULT CALLED TO, READ BACK BY AND VERIFIED WITH: PHARMD ELLEN JACKSON 09/20/21@1 :322 BY TW IN BOTH AEROBIC AND ANAEROBIC BOTTLES Performed at [facility], [place] [address] [place], [place] [address]    Culture ESCHERICHIA COLI (A)  Final   Report Status 09/22/2021 FINAL  Final   Organism ID, Bacteria ESCHERICHIA COLI   Final      Susceptibility   Escherichia coli - MIC*    AMPICILLIN >=32 RESISTANT Resistant     CEFAZOLIN 16 SENSITIVE Sensitive     CEFEPIME <=0.12 SENSITIVE Sensitive     CEFTAZIDIME <=1 SENSITIVE Sensitive     CEFTRIAXONE <=0.25 SENSITIVE Sensitive     CIPROFLOXACIN <=0.25 SENSITIVE Sensitive     GENTAMICIN <=1 SENSITIVE Sensitive     IMIPENEM <=0.25 SENSITIVE Sensitive     TRIMETH/SULFA <=20 SENSITIVE Sensitive     AMPICILLIN/SULBACTAM 16 INTERMEDIATE Intermediate     PIP/TAZO <=4 SENSITIVE Sensitive     * ESCHERICHIA COLI  Resp Panel by RT-PCR (Flu A&B, Covid) Nasopharyngeal Swab     Status: None   Collection Time: 09/19/21 11:32 AM   Specimen: Nasopharyngeal Swab; Nasopharyngeal(NP) swabs in vial transport medium  Result Value Ref Range Status   SARS Coronavirus 2 by RT PCR NEGATIVE NEGATIVE Final    Comment: (NOTE) SARS-CoV-2 target nucleic acids are NOT DETECTED.  The SARS-CoV-2 RNA is generally detectable in upper respiratory specimens during the acute phase of infection. The lowest concentration of SARS-CoV-2 viral copies this assay can detect is 138 copies/mL. A negative result does not preclude SARS-Cov-2 infection and should not be used as the sole basis for treatment or other patient management decisions. A negative result may occur with  improper specimen collection/handling, submission of specimen other than nasopharyngeal swab, presence of viral mutation(s) within the areas targeted by this assay, and inadequate number of viral copies(<138 copies/mL). A negative result must be combined with clinical observations, patient history, and epidemiological information. The expected result is Negative.  Fact Sheet for Patients:  EntrepreneurPulse.com.au  Fact Sheet for Healthcare Providers:  IncredibleEmployment.be  This test is no t yet approved or cleared by the [place] FDA and  has been authorized for detection and/or  diagnosis of SARS-CoV-2 by FDA under an Emergency Use Authorization (EUA). This EUA will remain  in effect (meaning this test can be used) for the duration of the COVID-19 declaration under Section 564(b)(1) of the Act, 21 U.S.C.section 360bbb-3(b)(1), unless the authorization is terminated  or revoked sooner.       Influenza A by PCR NEGATIVE NEGATIVE  Final   Influenza B by PCR NEGATIVE NEGATIVE Final    Comment: (NOTE) The Xpert Xpress SARS-CoV-2/FLU/RSV plus assay is intended as an aid in the diagnosis of influenza from Nasopharyngeal swab specimens and should not be used as a sole basis for treatment. Nasal washings and aspirates are unacceptable for Xpert Xpress SARS-CoV-2/FLU/RSV testing.  Fact Sheet for Patients: EntrepreneurPulse.com.au  Fact Sheet for Healthcare Providers: IncredibleEmployment.be  This test is not yet approved or cleared by the [place] FDA and has been authorized for detection and/or diagnosis of SARS-CoV-2 by FDA under an Emergency Use Authorization (EUA). This EUA will remain in effect (meaning this test can be used) for the duration of the COVID-19 declaration under Section 564(b)(1) of the Act, 21 U.S.C. section 360bbb-3(b)(1), unless the authorization is terminated or revoked.  Performed at [facility], [place] [address], [place], [place] [address]   Blood Culture ID Panel (Reflexed)     Status: Abnormal   Collection Time: 09/19/21 11:32 AM  Result Value Ref Range Status   Enterococcus faecalis NOT DETECTED NOT DETECTED Final   Enterococcus Faecium NOT DETECTED NOT DETECTED Final   Listeria monocytogenes NOT DETECTED NOT DETECTED Final   Staphylococcus species NOT DETECTED NOT DETECTED Final   Staphylococcus aureus (BCID) NOT DETECTED NOT DETECTED Final   Staphylococcus epidermidis NOT DETECTED NOT DETECTED Final   Staphylococcus lugdunensis NOT DETECTED NOT DETECTED Final    Streptococcus species NOT DETECTED NOT DETECTED Final   Streptococcus agalactiae NOT DETECTED NOT DETECTED Final   Streptococcus pneumoniae NOT DETECTED NOT DETECTED Final   Streptococcus pyogenes NOT DETECTED NOT DETECTED Final   A.calcoaceticus-baumannii NOT DETECTED NOT DETECTED Final   Bacteroides fragilis NOT DETECTED NOT DETECTED Final   Enterobacterales DETECTED (A) NOT DETECTED Final    Comment: Enterobacterales represent a large order of gram negative bacteria, not a single organism. CRITICAL RESULT CALLED TO, READ BACK BY AND VERIFIED WITH: Nash 09/20/21@1 :322 BY TW    Enterobacter cloacae complex NOT DETECTED NOT DETECTED Final   Escherichia coli DETECTED (A) NOT DETECTED Final    Comment: CRITICAL RESULT CALLED TO, READ BACK BY AND VERIFIED WITH: PHARMD ELLEN JACKSON 09/20/21@1 :322 BY TW    Klebsiella aerogenes NOT DETECTED NOT DETECTED Final   Klebsiella oxytoca NOT DETECTED NOT DETECTED Final   Klebsiella pneumoniae NOT DETECTED NOT DETECTED Final   Proteus species NOT DETECTED NOT DETECTED Final   Salmonella species NOT DETECTED NOT DETECTED Final   Serratia marcescens NOT DETECTED NOT DETECTED Final   Haemophilus influenzae NOT DETECTED NOT DETECTED Final   Neisseria meningitidis NOT DETECTED NOT DETECTED Final   Pseudomonas aeruginosa NOT DETECTED NOT DETECTED Final   Stenotrophomonas maltophilia NOT DETECTED NOT DETECTED Final   Candida albicans NOT DETECTED NOT DETECTED Final   Candida auris NOT DETECTED NOT DETECTED Final   Candida glabrata NOT DETECTED NOT DETECTED Final   Candida krusei NOT DETECTED NOT DETECTED Final   Candida parapsilosis NOT DETECTED NOT DETECTED Final   Candida tropicalis NOT DETECTED NOT DETECTED Final   Cryptococcus neoformans/gattii NOT DETECTED NOT DETECTED Final   CTX-M ESBL NOT DETECTED NOT DETECTED Final   Carbapenem resistance IMP NOT DETECTED NOT DETECTED Final   Carbapenem resistance KPC NOT DETECTED NOT DETECTED  Final   Carbapenem resistance NDM NOT DETECTED NOT DETECTED Final   Carbapenem resist OXA 48 LIKE NOT DETECTED NOT DETECTED Final   Carbapenem resistance VIM NOT DETECTED NOT DETECTED Final    Comment: Performed at [facility], [place] [address] [place], [place] [address]  Culture, blood (routine x 2)     Status: Abnormal   Collection Time: 09/19/21  1:20 PM   Specimen: BLOOD  Result Value Ref Range Status   Specimen Description   Final    BLOOD BLOOD RIGHT WRIST Performed at [place] [address] [place], [place] [address]    Special Requests   Final    BOTTLES DRAWN AEROBIC AND ANAEROBIC Blood Culture adequate volume Performed at [place] [address] [place], [place] [address]    Culture  Setup Time   Final    GRAM NEGATIVE RODS ANAEROBIC BOTTLE ONLY CRITICAL VALUE NOTED.  VALUE IS CONSISTENT WITH PREVIOUSLY REPORTED AND CALLED VALUE. IN BOTH AEROBIC AND ANAEROBIC BOTTLES    Culture (A)  Final    ESCHERICHIA COLI SUSCEPTIBILITIES PERFORMED ON PREVIOUS CULTURE WITHIN THE LAST 5 DAYS. Performed at [facility], [place] [address], [place], [place] [address]    Report Status 09/22/2021 FINAL  Final  MRSA Next Gen by PCR, Nasal     Status: None   Collection Time: 09/19/21  9:55 PM   Specimen: Nasal Mucosa; Nasal Swab  Result Value Ref Range Status   MRSA by PCR Next Gen NOT DETECTED NOT DETECTED Final    Comment: (NOTE) The GeneXpert MRSA Assay (FDA approved for NASAL specimens only), is one component of a comprehensive MRSA colonization surveillance program. It is not intended to diagnose MRSA infection nor to guide or monitor treatment for MRSA infections. Test performance is not FDA approved in patients less than 53 years old. Performed at [facility], [place] [address] [place],  [address]       Radiology Studies: No results found.  Scheduled Meds:  ARIPiprazole  10 mg Oral Daily    aspirin  81 mg Oral Daily   Chlorhexidine Gluconate Cloth  6 each Topical Q0600   DULoxetine  60 mg Oral BID   enoxaparin (LOVENOX) injection  40 mg Subcutaneous Q24H   gabapentin  600 mg Oral BID   levothyroxine  25 mcg Oral QAC breakfast   potassium chloride  40 mEq Oral BID   pravastatin  40 mg Oral Daily   Continuous Infusions:  cefTRIAXone (ROCEPHIN)  IV 2 g (09/22/21 0211)     LOS: 3 days   Marylu Lund, MD [facility] Pager On Amion  If 7PM-7AM, please contact night-coverage 09/22/2021, 1:26 PM

## 2021-09-22 NOTE — Plan of Care (Signed)
  Problem: Safety: Goal: Non-violent Restraint(s) Outcome: Progressing   Problem: Nutrition: Goal: Adequate nutrition will be maintained Outcome: Progressing   Problem: Safety: Goal: Ability to remain free from injury will improve Outcome: Progressing

## 2021-09-22 NOTE — Plan of Care (Signed)
Pt improving mentation was able to remove ankle restraints. Unable to remove wrist. Pt confused thinking its 1920's and she has to go to school when performing range of motion pt attempt remove iv and tele.  Problem: Safety: Goal: Non-violent Restraint(s) Outcome: Progressing   Problem: Education: Goal: Knowledge of General Education information will improve Description: Including pain rating scale, medication(s)/side effects and non-pharmacologic comfort measures Outcome: Progressing   Problem: Health Behavior/Discharge Planning: Goal: Ability to manage health-related needs will improve Outcome: Progressing   Problem: Clinical Measurements: Goal: Ability to maintain clinical measurements within normal limits will improve Outcome: Progressing Goal: Will remain free from infection Outcome: Progressing Goal: Diagnostic test results will improve Outcome: Progressing Goal: Respiratory complications will improve Outcome: Progressing Goal: Cardiovascular complication will be avoided Outcome: Progressing   Problem: Nutrition: Goal: Adequate nutrition will be maintained Outcome: Progressing   Problem: Coping: Goal: Level of anxiety will decrease Outcome: Progressing   Problem: Safety: Goal: Ability to remain free from injury will improve Outcome: Progressing   Problem: Skin Integrity: Goal: Risk for impaired skin integrity will decrease Outcome: Progressing

## 2021-09-23 DIAGNOSIS — A419 Sepsis, unspecified organism: Secondary | ICD-10-CM | POA: Diagnosis not present

## 2021-09-23 LAB — CBC
HCT: 29.8 % — ABNORMAL LOW (ref 36.0–46.0)
Hemoglobin: 9.4 g/dL — ABNORMAL LOW (ref 12.0–15.0)
MCH: 30.8 pg (ref 26.0–34.0)
MCHC: 31.5 g/dL (ref 30.0–36.0)
MCV: 97.7 fL (ref 80.0–100.0)
Platelets: 147 10*3/uL — ABNORMAL LOW (ref 150–400)
RBC: 3.05 MIL/uL — ABNORMAL LOW (ref 3.87–5.11)
RDW: 15.6 % — ABNORMAL HIGH (ref 11.5–15.5)
WBC: 5.7 10*3/uL (ref 4.0–10.5)
nRBC: 0 % (ref 0.0–0.2)

## 2021-09-23 LAB — GLUCOSE, CAPILLARY
Glucose-Capillary: 75 mg/dL (ref 70–99)
Glucose-Capillary: 109 mg/dL — ABNORMAL HIGH (ref 70–99)
Glucose-Capillary: 76 mg/dL (ref 70–99)

## 2021-09-23 LAB — COMPREHENSIVE METABOLIC PANEL
ALT: 35 U/L (ref 0–44)
AST: 39 U/L (ref 15–41)
Albumin: 2.6 g/dL — ABNORMAL LOW (ref 3.5–5.0)
Alkaline Phosphatase: 74 U/L (ref 38–126)
Anion gap: 7 (ref 5–15)
BUN: 18 mg/dL (ref 8–23)
CO2: 27 mmol/L (ref 22–32)
Calcium: 8.4 mg/dL — ABNORMAL LOW (ref 8.9–10.3)
Chloride: 109 mmol/L (ref 98–111)
Creatinine, Ser: 0.47 mg/dL (ref 0.44–1.00)
GFR, Estimated: >60 mL/min (ref >60)
Glucose, Bld: 89 mg/dL (ref 70–99)
Potassium: 4.2 mmol/L (ref 3.5–5.1)
Sodium: 143 mmol/L (ref 135–145)
Total Bilirubin: 1.2 mg/dL (ref 0.3–1.2)
Total Protein: 5.6 g/dL — ABNORMAL LOW (ref 6.5–8.1)

## 2021-09-23 MED ORDER — SULFAMETHOXAZOLE-TRIMETHOPRIM 800-160 MG PO TABS
1.0000 | ORAL_TABLET | Freq: Two times a day (BID) | ORAL | Status: DC
  Administered 2021-09-23 – 2021-09-24 (×3): 1 via ORAL
  Filled 2021-09-23 (×3): qty 1

## 2021-09-23 NOTE — Evaluation (Signed)
Occupational Therapy Evaluation Patient Details Name: [name]: [id number] DOB: [birth year] Today's Date: 09/23/2021   History of Present Illness Pt is 75 yo female who presented to ED with altered mental status. PMH significant of anal cancer, anxiety, osteoarthritis, depression, hyperlipidemia, history of rectal bleeding, history of Staphylococcus bacteremia with sepsis   Clinical Impression   Pt admitted with the above diagnoses and presents with below problem list. Pt will benefit from continued acute OT to address the below listed deficits and maximize independence with basic ADLs prior to d/c to next venue. Per chart review and pt report, it appears pt was living alone, drives, mod I with ADLs. Daughter lives nearby. Pt presents with impaired cognition, generalized weakness, decreased activity tolerance, and impaired balance impacting ADLs. Pt currently needs up to +2 assist to take pivot steps to BSC/recliner, max A +2 for pericare in standing and LB ADLs, mod A with UB ADLs. Full session details below.        Recommendations for follow up therapy are one component of a multi-disciplinary discharge planning process, led by the attending physician.  Recommendations may be updated based on patient status, additional functional criteria and insurance authorization.   Follow Up Recommendations  Skilled nursing-short term rehab (<3 hours/day)    Assistance Recommended at Discharge Frequent or constant Supervision/Assistance  Functional Status Assessment  Patient has had a recent decline in their functional status and demonstrates the ability to make significant improvements in function in a reasonable and predictable amount of time.  Equipment Recommendations  Other (comment) (defer to next venue)    Recommendations for Other Services PT consult     Precautions / Restrictions Precautions Precautions: Fall Restrictions Weight Bearing Restrictions: No Other  Position/Activity Restrictions: wounds on B feet, per RN wound care consult placed      Mobility Bed Mobility Overal bed mobility: Needs Assistance Bed Mobility: Sit to Supine       Sit to supine: Mod assist   General bed mobility comments: to control descent and fully advance BLE onto bed and in neutral position in bed    Transfers Overall transfer level: Needs assistance Equipment used: Rolling walker (2 wheels) Transfers: Sit to/from Stand;Bed to chair/wheelchair/BSC Sit to Stand: Mod assist;+2 physical assistance;+2 safety/equipment     Step pivot transfers: Mod assist;+2 safety/equipment     General transfer comment: Initially stood and able to take a step to access [facility] with +1 assist. Fatigued quickly, required +2 assist for subsequent transfers to powerup, steady and control descent. Once finished on BSC, bed brought to pt to sit (in lieu of pivot transfer) 2/2 fatigue and BLE weakness.      Balance Overall balance assessment: Needs assistance;History of Falls Sitting-balance support: Bilateral upper extremity supported;Feet supported Sitting balance-Leahy Scale: Fair     Standing balance support: Bilateral upper extremity supported;During functional activity;Reliant on assistive device for balance Standing balance-Leahy Scale: Poor Standing balance comment: rw and up to mod A in standing as pt fatigued.                           ADL either performed or assessed with clinical judgement   ADL Overall ADL's : Needs assistance/impaired Eating/Feeding: Set up;Sitting   Grooming: Moderate assistance;Sitting   Upper Body Bathing: Moderate assistance;Sitting   Lower Body Bathing: Maximal assistance;+2 for safety/equipment;+2 for physical assistance;Sit to/from stand   Upper Body Dressing : Moderate assistance;Sitting   Lower Body Dressing: Maximal assistance;+2 for  safety/equipment;+2 for physical assistance;Sit to/from stand   Toilet Transfer:  Moderate assistance;+2 for physical assistance;+2 for safety/equipment;Stand-pivot;BSC/3in1;Rolling walker (2 wheels) Toilet Transfer Details (indicate cue type and reason): fatigued once finished on BSC, bed brought to pt Toileting- Clothing Manipulation and Hygiene: Maximal assistance;+2 for physical assistance;+2 for safety/equipment;Sit to/from stand Toileting - Clothing Manipulation Details (indicate cue type and reason): mod A +2 for balance, total A for pericare in standing       General ADL Comments: Pt expressing need to void at start of session. Pt able to take a step to access BSC from recliner with +1 assist. Fatigued quickly requiring +2 assist for pericare and back to bed.     Vision         Perception     Praxis      Pertinent Vitals/Pain Pain Assessment: Faces Faces Pain Scale: Hurts a little bit Pain Location: B feet Pain Descriptors / Indicators: Grimacing;Guarding Pain Intervention(s): Limited activity within patient's tolerance;Monitored during session;Repositioned;Other (comment) (provided rw for offloading in standing)     Hand Dominance     Extremity/Trunk Assessment Upper Extremity Assessment Upper Extremity Assessment: Generalized weakness   Lower Extremity Assessment Lower Extremity Assessment: Defer to PT evaluation       Communication Communication Communication: No difficulties   Cognition Arousal/Alertness: Awake/alert Behavior During Therapy: Flat affect;Anxious Overall Cognitive Status: Impaired/Different from baseline Area of Impairment: Orientation;Attention;Memory;Following commands;Safety/judgement;Awareness;Problem solving                 Orientation Level: Time;Situation Current Attention Level: Focused Memory: Decreased recall of precautions;Decreased short-term memory Following Commands: Follows one step commands inconsistently Safety/Judgement: Decreased awareness of safety;Decreased awareness of deficits Awareness:  Intellectual Problem Solving: Slow processing;Decreased initiation;Difficulty sequencing;Requires verbal cues General Comments: Decreased safety awareness. poor problem solving.     General Comments       Exercises     Shoulder Instructions      Home Living Family/patient expects to be discharged to:: Skilled nursing facility                                 Additional Comments: Per chart review and pt report, she lives alone. Daughter lives nearby. Going to OP PT sessions. Pt reports she drives.      Prior Functioning/Environment Prior Level of Function : Patient poor historian/Family not available                        OT Problem List: Decreased strength;Decreased activity tolerance;Impaired balance (sitting and/or standing);Decreased knowledge of use of DME or AE;Decreased knowledge of precautions;Decreased cognition;Decreased safety awareness;Pain      OT Treatment/Interventions:      OT Goals(Current goals can be found in the care plan section) Acute Rehab OT Goals Patient Stated Goal: not stated OT Goal Formulation: With patient Time For Goal Achievement: 10/07/21 Potential to Achieve Goals: Good  OT Frequency: Min 2X/week   Barriers to D/C:            Co-evaluation              AM-PAC OT "6 Clicks" Daily Activity     Outcome Measure Help from another person eating meals?: A Little Help from another person taking care of personal grooming?: A Lot Help from another person toileting, which includes using toliet, bedpan, or urinal?: A Lot Help from another person bathing (including washing, rinsing, drying)?: A Lot Help from  another person to put on and taking off regular upper body clothing?: A Little Help from another person to put on and taking off regular lower body clothing?: A Lot 6 Click Score: 14   End of Session Equipment Utilized During Treatment: Rolling walker (2 wheels) Nurse Communication: Mobility status;Other  (comment) (bm)  Activity Tolerance: Patient limited by fatigue Patient left: in bed;with call bell/phone within reach;with bed alarm set  OT Visit Diagnosis: Unsteadiness on feet (R26.81);Muscle weakness (generalized) (M62.81);History of falling (Z91.81);Pain;Other symptoms and signs involving cognitive function                Time: 1009-1037 OT Time Calculation (min): 28 min Charges:  OT General Charges $OT Visit: 1 Visit OT Evaluation $OT Eval Moderate Complexity: 1 Mod OT Treatments $Self Care/Home Management : 8-22 mins  Tyrone Schimke, OT Acute Rehabilitation Services Office: [phone]   Hortencia Pilar 09/23/2021, 11:02 AM

## 2021-09-23 NOTE — Progress Notes (Signed)
PROGRESS NOTE    [name]  OYD:[id number] DOB: Feb 11, 1946 DOA: 09/19/2021 PCP: Tiffany Stains, MD    Brief Narrative:  75 y.o. female with medical history significant of anal cancer, anxiety, osteoarthritis, depression, hyperlipidemia, history of rectal bleeding, history of Staphylococcus bacteremia with sepsis who is coming to the emergency department due to altered mental status.  She is normally fully oriented. Pt was found to be floridly septic with UTI and later ecoli bacteremia  Assessment & Plan:   Principal Problem:   Sepsis due to undetermined organism [facility]) Active Problems:   Hyperlipidemia   Depression   Anxiety   Acute metabolic encephalopathy   Hypothyroid   Hypokalemia   Leukopenia   Prolonged QT interval  Principal Problem:   Acute metabolic encephalopathy secondary to severe Sepsis secondary to ecoli bacteremia with UTI present on admit -Presented floridly septic with leukocytosis, lactic acidosis, tachycardia, tachypena. Sepsis physiology has resolved. Encephalopathy improving but remains confused. -Blood cx pos for ecoli - today day 5 of ceftriaxone, will transition to bactrim, plan to complete 7 days through 11/25    Active Problems:  # Debility Ot advising snf and daughter agreeable - f/u pt consult, have made TOC aware of likely SNF rec    Leukopenia Resolved  - monitor    Hypokalemia Resolved w/ repletion - monitor    Prolonged QT interval Avoid QT prolonging meds if possible.      Depression with anxiety Continue Cymbalta 60 mg p.o. twice daily. Continue with trazodone 100 mg p.o. at bedtime PRN.     Hypothyroid Continue levothyroxine 25 mcg p.o. daily. Most recent TSH from June 27, 2021 noted to be 3.116  Lactic acidosis -Likely secondary to presenting severe sepsis -Now resolved after IVF hydration  DVT prophylaxis: Lovenox subq Code Status: Full Family Communication: daughter updated telephonically 11/23  Status is:  Inpatient  Remains inpatient appropriate because: unsafe d/c plan    Consultants:    Procedures:    Antimicrobials: Anti-infectives (From admission, onward)    Start     Dose/Rate Route Frequency Ordered Stop   09/20/21 1500  vancomycin (VANCOCIN) IVPB 1000 mg/200 mL premix  Status:  Discontinued        1,000 mg 200 mL/hr over 60 Minutes Intravenous Every 24 hours 09/19/21 1536 09/21/21 0734   09/20/21 0300  ceFEPIme (MAXIPIME) 2 g in sodium chloride 0.9 % 100 mL IVPB  Status:  Discontinued        2 g 200 mL/hr over 30 Minutes Intravenous Every 12 hours 09/19/21 1535 09/20/21 0138   09/20/21 0230  cefTRIAXone (ROCEPHIN) 2 g in sodium chloride 0.9 % 100 mL IVPB        2 g 200 mL/hr over 30 Minutes Intravenous Every 24 hours 09/20/21 0138     09/20/21 0200  metroNIDAZOLE (FLAGYL) IVPB 500 mg  Status:  Discontinued        500 mg 100 mL/hr over 60 Minutes Intravenous Every 12 hours 09/19/21 1526 09/21/21 0734   09/19/21 1415  ceFEPIme (MAXIPIME) 2 g in sodium chloride 0.9 % 100 mL IVPB        2 g 200 mL/hr over 30 Minutes Intravenous  Once 09/19/21 1401 09/19/21 1608   09/19/21 1415  metroNIDAZOLE (FLAGYL) IVPB 500 mg        500 mg 100 mL/hr over 60 Minutes Intravenous  Once 09/19/21 1401 09/19/21 1609   09/19/21 1415  vancomycin (VANCOCIN) IVPB 1000 mg/200 mL premix  1,000 mg 200 mL/hr over 60 Minutes Intravenous  Once 09/19/21 1401 09/19/21 1609       Subjective: More awake and conversant today  Objective: Vitals:   09/22/21 1227 09/22/21 1300 09/22/21 2227 09/23/21 0449  BP: 124/75  129/71 (!) 107/51  Pulse: 91  84 87  Resp: 19 15  19   Temp: 99.2 F (37.3 C)  99 F (37.2 C) 98.5 F (36.9 C)  TempSrc: Axillary  Oral Oral  SpO2: 100%  98% 96%  Weight:      Height:        Intake/Output Summary (Last 24 hours) at 09/23/2021 1138 Last data filed at 09/23/2021 0537 Gross per 24 hour  Intake 836 ml  Output 900 ml  Net -64 ml   Filed Weights   09/19/21  1107  Weight: 72.6 kg    Examination: General exam: Conversant, in no acute distress Respiratory system: normal chest rise, clear, no audible wheezing Cardiovascular system: regular rhythm, s1-s2 Gastrointestinal system: Nondistended, nontender, pos BS Central nervous system: No seizures, no tremors Extremities: No cyanosis, no joint deformities Skin: No rashes, no pallor Psychiatry: Affect normal // no auditory hallucinations   Data Reviewed: I have personally reviewed following labs and imaging studies  CBC: Recent Labs  Lab 09/19/21 1132 09/20/21 0535 09/21/21 0309 09/22/21 0444 09/23/21 0500  WBC 1.1* 15.0* 14.5* 10.2 5.7  NEUTROABS 0.9* 13.4*  --   --   --   HGB 12.4 10.2* 10.3* 9.7* 9.4*  HCT 38.1 30.6* 32.4* 30.6* 29.8*  MCV 96.2 96.5 98.2 97.8 97.7  PLT 152 141* 110* 136* 132*   Basic Metabolic Panel: Recent Labs  Lab 09/19/21 1132 09/20/21 0251 09/21/21 0309 09/22/21 0444 09/23/21 0500  NA 139 140 145 144 143  K 3.2* 3.8 4.2 3.2* 4.2  CL 103 108 110 111 109  CO2 25 21* 23 27 27   GLUCOSE 101* 77 55* 128* 89  BUN 16 20 23 20 18   CREATININE 0.95 0.99 0.80 0.56 0.47  CALCIUM 9.0 7.7* 8.3* 8.3* 8.4*   GFR: Estimated Creatinine Clearance: 57.6 mL/min (by C-G formula based on SCr of 0.47 mg/dL). Liver Function Tests: Recent Labs  Lab 09/19/21 1132 09/20/21 0251 09/21/21 0309 09/22/21 0444 09/23/21 0500  AST 36 107* 91* 41 39  ALT 23 66* 62* 47* 35  ALKPHOS 113 70 76 80 74  BILITOT 1.2 0.8 0.8 0.6 1.2  PROT 7.2 5.5* 5.9* 5.9* 5.6*  ALBUMIN 3.9 2.7* 2.8* 2.8* 2.6*   No results for input(s): LIPASE, AMYLASE in the last 168 hours. No results for input(s): AMMONIA in the last 168 hours. Coagulation Profile: No results for input(s): INR, PROTIME in the last 168 hours. Cardiac Enzymes: No results for input(s): CKTOTAL, CKMB, CKMBINDEX, TROPONINI in the last 168 hours. BNP (last 3 results) No results for input(s): PROBNP in the last 8760  hours. HbA1C: No results for input(s): HGBA1C in the last 72 hours. CBG: Recent Labs  Lab 09/22/21 1147 09/22/21 1606 09/22/21 2229 09/23/21 0712 09/23/21 1128  GLUCAP 83 82 93 75 109*   Lipid Profile: No results for input(s): CHOL, HDL, LDLCALC, TRIG, CHOLHDL, LDLDIRECT in the last 72 hours. Thyroid Function Tests: No results for input(s): TSH, T4TOTAL, FREET4, T3FREE, THYROIDAB in the last 72 hours. Anemia Panel: No results for input(s): VITAMINB12, FOLATE, FERRITIN, TIBC, IRON, RETICCTPCT in the last 72 hours. Sepsis Labs: Recent Labs  Lab 09/19/21 1618 09/19/21 2000 09/20/21 0859 09/20/21 1150  LATICACIDVEN 4.0* 6.1* 2.5* 1.5  Recent Results (from the past 240 hour(s))  Culture, blood (routine x 2)     Status: Abnormal   Collection Time: 09/19/21 11:32 AM   Specimen: BLOOD  Result Value Ref Range Status   Specimen Description   Final    BLOOD LEFT ANTECUBITAL Performed at [facility] [address] [place], [place] [address]    Special Requests   Final    BOTTLES DRAWN AEROBIC AND ANAEROBIC Blood Culture results may not be optimal due to an excessive volume of blood received in culture bottles Performed at [facility] [address], [place], [place] [address]    Culture  Setup Time   Final    GRAM NEGATIVE RODS ANAEROBIC BOTTLE ONLY CRITICAL RESULT CALLED TO, READ BACK BY AND VERIFIED WITH: PHARMD ELLEN JACKSON 09/20/21@1 :322 BY TW IN BOTH AEROBIC AND ANAEROBIC BOTTLES Performed at [facility], [place] [address], [place], [place] [address]    Culture ESCHERICHIA COLI (A)  Final   Report Status 09/22/2021 FINAL  Final   Organism ID, Bacteria ESCHERICHIA COLI  Final      Susceptibility   Escherichia coli - MIC*    AMPICILLIN >=32 RESISTANT Resistant     CEFAZOLIN 16 SENSITIVE Sensitive     CEFEPIME <=0.12 SENSITIVE Sensitive     CEFTAZIDIME <=1 SENSITIVE Sensitive     CEFTRIAXONE <=0.25 SENSITIVE Sensitive      CIPROFLOXACIN <=0.25 SENSITIVE Sensitive     GENTAMICIN <=1 SENSITIVE Sensitive     IMIPENEM <=0.25 SENSITIVE Sensitive     TRIMETH/SULFA <=20 SENSITIVE Sensitive     AMPICILLIN/SULBACTAM 16 INTERMEDIATE Intermediate     PIP/TAZO <=4 SENSITIVE Sensitive     * ESCHERICHIA COLI  Resp Panel by RT-PCR (Flu A&B, Covid) Nasopharyngeal Swab     Status: None   Collection Time: 09/19/21 11:32 AM   Specimen: Nasopharyngeal Swab; Nasopharyngeal(NP) swabs in vial transport medium  Result Value Ref Range Status   SARS Coronavirus 2 by RT PCR NEGATIVE NEGATIVE Final    Comment: (NOTE) SARS-CoV-2 target nucleic acids are NOT DETECTED.  The SARS-CoV-2 RNA is generally detectable in upper respiratory specimens during the acute phase of infection. The lowest concentration of SARS-CoV-2 viral copies this assay can detect is 138 copies/mL. A negative result does not preclude SARS-Cov-2 infection and should not be used as the sole basis for treatment or other patient management decisions. A negative result may occur with  improper specimen collection/handling, submission of specimen other than nasopharyngeal swab, presence of viral mutation(s) within the areas targeted by this assay, and inadequate number of viral copies(<138 copies/mL). A negative result must be combined with clinical observations, patient history, and epidemiological information. The expected result is Negative.  Fact Sheet for Patients:  EntrepreneurPulse.com.au  Fact Sheet for Healthcare Providers:  IncredibleEmployment.be  This test is no t yet approved or cleared by the [place] FDA and  has been authorized for detection and/or diagnosis of SARS-CoV-2 by FDA under an Emergency Use Authorization (EUA). This EUA will remain  in effect (meaning this test can be used) for the duration of the COVID-19 declaration under Section 564(b)(1) of the Act, 21 U.S.C.section 360bbb-3(b)(1), unless  the authorization is terminated  or revoked sooner.       Influenza A by PCR NEGATIVE NEGATIVE Final   Influenza B by PCR NEGATIVE NEGATIVE Final    Comment: (NOTE) The Xpert Xpress SARS-CoV-2/FLU/RSV plus assay is intended as an aid in the diagnosis of influenza from  Nasopharyngeal swab specimens and should not be used as a sole basis for treatment. Nasal washings and aspirates are unacceptable for Xpert Xpress SARS-CoV-2/FLU/RSV testing.  Fact Sheet for Patients: EntrepreneurPulse.com.au  Fact Sheet for Healthcare Providers: IncredibleEmployment.be  This test is not yet approved or cleared by the [place] FDA and has been authorized for detection and/or diagnosis of SARS-CoV-2 by FDA under an Emergency Use Authorization (EUA). This EUA will remain in effect (meaning this test can be used) for the duration of the COVID-19 declaration under Section 564(b)(1) of the Act, 21 U.S.C. section 360bbb-3(b)(1), unless the authorization is terminated or revoked.  Performed at [facility], [place] [address] [place], [place] [address]   Blood Culture ID Panel (Reflexed)     Status: Abnormal   Collection Time: 09/19/21 11:32 AM  Result Value Ref Range Status   Enterococcus faecalis NOT DETECTED NOT DETECTED Final   Enterococcus Faecium NOT DETECTED NOT DETECTED Final   Listeria monocytogenes NOT DETECTED NOT DETECTED Final   Staphylococcus species NOT DETECTED NOT DETECTED Final   Staphylococcus aureus (BCID) NOT DETECTED NOT DETECTED Final   Staphylococcus epidermidis NOT DETECTED NOT DETECTED Final   Staphylococcus lugdunensis NOT DETECTED NOT DETECTED Final   Streptococcus species NOT DETECTED NOT DETECTED Final   Streptococcus agalactiae NOT DETECTED NOT DETECTED Final   Streptococcus pneumoniae NOT DETECTED NOT DETECTED Final   Streptococcus pyogenes NOT DETECTED NOT DETECTED Final   A.calcoaceticus-baumannii NOT DETECTED  NOT DETECTED Final   Bacteroides fragilis NOT DETECTED NOT DETECTED Final   Enterobacterales DETECTED (A) NOT DETECTED Final    Comment: Enterobacterales represent a large order of gram negative bacteria, not a single organism. CRITICAL RESULT CALLED TO, READ BACK BY AND VERIFIED WITH: PHARMD ELLEN JACKSON 09/20/21@1 :322 BY TW    Enterobacter cloacae complex NOT DETECTED NOT DETECTED Final   Escherichia coli DETECTED (A) NOT DETECTED Final    Comment: CRITICAL RESULT CALLED TO, READ BACK BY AND VERIFIED WITH: PHARMD ELLEN JACKSON 09/20/21@1 :322 BY TW    Klebsiella aerogenes NOT DETECTED NOT DETECTED Final   Klebsiella oxytoca NOT DETECTED NOT DETECTED Final   Klebsiella pneumoniae NOT DETECTED NOT DETECTED Final   Proteus species NOT DETECTED NOT DETECTED Final   Salmonella species NOT DETECTED NOT DETECTED Final   Serratia marcescens NOT DETECTED NOT DETECTED Final   Haemophilus influenzae NOT DETECTED NOT DETECTED Final   Neisseria meningitidis NOT DETECTED NOT DETECTED Final   Pseudomonas aeruginosa NOT DETECTED NOT DETECTED Final   Stenotrophomonas maltophilia NOT DETECTED NOT DETECTED Final   Candida albicans NOT DETECTED NOT DETECTED Final   Candida auris NOT DETECTED NOT DETECTED Final   Candida glabrata NOT DETECTED NOT DETECTED Final   Candida krusei NOT DETECTED NOT DETECTED Final   Candida parapsilosis NOT DETECTED NOT DETECTED Final   Candida tropicalis NOT DETECTED NOT DETECTED Final   Cryptococcus neoformans/gattii NOT DETECTED NOT DETECTED Final   CTX-M ESBL NOT DETECTED NOT DETECTED Final   Carbapenem resistance IMP NOT DETECTED NOT DETECTED Final   Carbapenem resistance KPC NOT DETECTED NOT DETECTED Final   Carbapenem resistance NDM NOT DETECTED NOT DETECTED Final   Carbapenem resist OXA 48 LIKE NOT DETECTED NOT DETECTED Final   Carbapenem resistance VIM NOT DETECTED NOT DETECTED Final    Comment: Performed at [facility], [address] [place],  [place] [address]  Culture, blood (routine x 2)     Status: Abnormal   Collection Time: 09/19/21  1:20 PM  Specimen: BLOOD  Result Value Ref Range Status   Specimen Description   Final    BLOOD BLOOD RIGHT WRIST Performed at [facility] [address], [place], [place] [address]    Special Requests   Final    BOTTLES DRAWN AEROBIC AND ANAEROBIC Blood Culture adequate volume Performed at [facility] [address], [place], [place] [address]    Culture  Setup Time   Final    GRAM NEGATIVE RODS ANAEROBIC BOTTLE ONLY CRITICAL VALUE NOTED.  VALUE IS CONSISTENT WITH PREVIOUSLY REPORTED AND CALLED VALUE. IN BOTH AEROBIC AND ANAEROBIC BOTTLES    Culture (A)  Final    ESCHERICHIA COLI SUSCEPTIBILITIES PERFORMED ON PREVIOUS CULTURE WITHIN THE LAST 5 DAYS. Performed at [facility], [place] [address], [place], [place] [address]    Report Status 09/22/2021 FINAL  Final  MRSA Next Gen by PCR, Nasal     Status: None   Collection Time: 09/19/21  9:55 PM   Specimen: Nasal Mucosa; Nasal Swab  Result Value Ref Range Status   MRSA by PCR Next Gen NOT DETECTED NOT DETECTED Final    Comment: (NOTE) The GeneXpert MRSA Assay (FDA approved for NASAL specimens only), is one component of a comprehensive MRSA colonization surveillance program. It is not intended to diagnose MRSA infection nor to guide or monitor treatment for MRSA infections. Test performance is not FDA approved in patients less than 20 years old. Performed at [facility], [place] [address], [place], [place] [address]       Radiology Studies: No results found.  Scheduled Meds:  ARIPiprazole  10 mg Oral Daily   aspirin  81 mg Oral Daily   Chlorhexidine Gluconate Cloth  6 each Topical Q0600   DULoxetine  60 mg Oral BID   enoxaparin (LOVENOX) injection  40 mg Subcutaneous Q24H   gabapentin  600 mg Oral BID   levothyroxine  25 mcg Oral QAC breakfast   pravastatin  40 mg  Oral Daily   Continuous Infusions:  cefTRIAXone (ROCEPHIN)  IV 2 g (09/23/21 0209)     LOS: 4 days   Desma Maxim, MD [facility] Pager On Amion  If 7PM-7AM, please contact night-coverage 09/23/2021, 11:38 AM

## 2021-09-23 NOTE — Evaluation (Signed)
Physical Therapy Evaluation Patient Details Name: [name]: [id number] DOB: [birth year] Today's Date: 09/23/2021  History of Present Illness  Pt is 75 yo female who presented to ED with altered mental status. PMH significant of anal cancer, anxiety, osteoarthritis, depression, hyperlipidemia, history of rectal bleeding, history of Staphylococcus bacteremia with sepsis  Clinical Impression  The patient is pleasantly confused. Stated in [facility] then [facility]. Patient requires mod assistance for bed mobility, then +2 to ambulate with RW x  5', reports painful foot on right. Patient  reports lives alone, was going to [place]. Patient currently will require 24/7 assistance at DC.  Pt admitted with above diagnosis.  Pt currently with functional limitations due to the deficits listed below (see PT Problem List). Pt will benefit from skilled PT to increase their independence and safety with mobility to allow discharge to the venue listed below.        Recommendations for follow up therapy are one component of a multi-disciplinary discharge planning process, led by the attending physician.  Recommendations may be updated based on patient status, additional functional criteria and insurance authorization.  Follow Up Recommendations Skilled nursing-short term rehab (<3 hours/day)    Assistance Recommended at Discharge Frequent or constant Supervision/Assistance  Functional Status Assessment    Equipment Recommendations  None recommended by PT    Recommendations for Other Services       Precautions / Restrictions Precautions Precautions: Fall Restrictions Weight Bearing Restrictions: No Other Position/Activity Restrictions: wounds on B feet, per RN wound care consult placed      Mobility  Bed Mobility Overal bed mobility: Needs Assistance Bed Mobility: Rolling;Sidelying to Sit;Supine to Sit Rolling: Min assist     Sit to supine: Mod assist   General bed mobility comments: cues   to roll,  able to move legs but mod to sit up.    Transfers Overall transfer level: Needs assistance Equipment used: Rolling walker (2 wheels) Transfers: Sit to/from Stand Sit to Stand: Mod assist;+2 physical assistance;+2 safety/equipment   Step pivot transfers: Mod assist;+2 safety/equipment       General transfer comment: Initially stood and stand at [facility]. decreased descent to reclinr.    Ambulation/Gait Ambulation/Gait assistance: Mod assist Gait Distance (Feet): 5 Feet Assistive device: Rolling walker (2 wheels) Gait Pattern/deviations: Step-to pattern;Decreased step length - left;Decreased step length - right;Shuffle       General Gait Details: amb 5 '. difficulty backing up to recliner.mod max support for balance  Stairs            Wheelchair Mobility    Modified Rankin (Stroke Patients Only)       Balance Overall balance assessment: Needs assistance;History of Falls Sitting-balance support: Bilateral upper extremity supported;Feet supported Sitting balance-Leahy Scale: Fair     Standing balance support: Bilateral upper extremity supported;During functional activity;Reliant on assistive device for balance Standing balance-Leahy Scale: Poor Standing balance comment: rw and up to mod A in standing as pt fatigued.                             Pertinent Vitals/Pain Pain Assessment: Faces Faces Pain Scale: Hurts little more Pain Location: B feet Pain Descriptors / Indicators: Grimacing;Guarding Pain Intervention(s): Monitored during session;Limited activity within patient's tolerance    Home Living Family/patient expects to be discharged to:: Skilled nursing facility                   Additional Comments: Per chart  review and pt report, she lives alone. Daughter lives nearby. Going to OP PT sessions. Pt reports she drives.    Prior Function Prior Level of Function : Patient poor historian/Family not available                     Hand  Dominance        Extremity/Trunk Assessment   Upper Extremity Assessment Upper Extremity Assessment: Defer to OT evaluation    Lower Extremity Assessment Lower Extremity Assessment: RLE deficits/detail;LLE deficits/detail RLE Deficits / Details: decreased Wb, wound on dorsum LLE Deficits / Details: decreased WB.    Cervical / Trunk Assessment Cervical / Trunk Assessment: Normal  Communication   Communication: No difficulties  Cognition Arousal/Alertness: Awake/alert Behavior During Therapy: Flat affect;Anxious Overall Cognitive Status: Impaired/Different from baseline Area of Impairment: Orientation;Attention;Memory;Following commands;Safety/judgement;Awareness;Problem solving                 Orientation Level: Time;Situation Current Attention Level: Focused Memory: Decreased recall of precautions;Decreased short-term memory Following Commands: Follows one step commands inconsistently Safety/Judgement: Decreased awareness of safety;Decreased awareness of deficits Awareness: Intellectual Problem Solving: Slow processing;Decreased initiation;Difficulty sequencing;Requires verbal cues General Comments: Decreased safety awareness. poor problem solving.        General Comments      Exercises     Assessment/Plan    PT Assessment Patient needs continued PT services  PT Problem List Decreased strength;Decreased balance;Decreased cognition;Decreased activity tolerance;Decreased mobility;Decreased safety awareness;Decreased knowledge of use of DME;Decreased skin integrity       PT Treatment Interventions DME instruction;Therapeutic activities;Cognitive remediation;Gait training;Therapeutic exercise;Patient/family education;Functional mobility training    PT Goals (Current goals can be found in the Care Plan section)  Acute Rehab PT Goals Patient Stated Goal: go home PT Goal Formulation: Patient unable to participate in goal setting Time For Goal Achievement:  10/07/21 Potential to Achieve Goals: Fair    Frequency Min 2X/week   Barriers to discharge        Co-evaluation               AM-PAC PT "6 Clicks" Mobility  Outcome Measure Help needed turning from your back to your side while in a flat bed without using bedrails?: A Lot Help needed moving from lying on your back to sitting on the side of a flat bed without using bedrails?: A Lot Help needed moving to and from a bed to a chair (including a wheelchair)?: A Lot Help needed standing up from a chair using your arms (e.g., wheelchair or bedside chair)?: A Lot Help needed to walk in hospital room?: Total Help needed climbing 3-5 steps with a railing? : Total 6 Click Score: 10    End of Session Equipment Utilized During Treatment: Gait belt Activity Tolerance: Patient tolerated treatment well;Patient limited by pain Patient left: in chair;with call bell/phone within reach;with chair alarm set Nurse Communication: Mobility status PT Visit Diagnosis: Difficulty in walking, not elsewhere classified (R26.2)    Time: 7017-7939 PT Time Calculation (min) (ACUTE ONLY): 14 min   Charges:   PT Evaluation $PT Eval Low Complexity: [place] Pager [phone] Office [phone]   Claretha Cooper 09/23/2021, 1:00 PM

## 2021-09-23 NOTE — TOC Initial Note (Signed)
Transition of Care [facility]) - Initial/Assessment Note    Patient Details  Name: [name]: [id number] Date of Birth: 1945/12/25  Transition of Care [facility]) CM/SW Contact:    Trish Mage, LCSW Phone Number: 09/23/2021, 1:36 PM  Clinical Narrative:    Patient seen in follow up to PT recommendation of SNF.  Ms Nikolov lives by herself here in [place], daughter is her main support.  Although she admits to weakness, she insists she was able to take care of herself and her home without any problem prior to this illness.  I presented SNF as an option, pointing out that she currently is a two person assist for transfers, but she continues to say she plans to go home.  With her permission, we attempted to include daughter in the conversation.  She was unavailable, and I left a message. TOC will continue to follow during the course of hospitalization.                Expected Discharge Plan: Skilled Nursing Facility Barriers to Discharge: SNF Pending bed offer   Patient Goals and CMS Choice     Choice offered to / list presented to : Patient, Adult Children  Expected Discharge Plan and Services Expected Discharge Plan: [place] Acute Care Choice: [place] Living arrangements for the past 2 months: Single Family Home                                      Prior Living Arrangements/Services Living arrangements for the past 2 months: Single Family Home Lives with:: Self Patient language and need for interpreter reviewed:: Yes        Need for Family Participation in Patient Care: Yes (Comment) Care giver support system in place?: Yes (comment)   Criminal Activity/Legal Involvement Pertinent to Current Situation/Hospitalization: No - Comment as needed  Activities of Daily Living      Permission Sought/Granted Permission sought to share information with : Family Supports Permission granted to share information with : Yes, Verbal  Permission Granted  Share Information with NAME: Carilyn Goodpasture (Daughter)   [phone]           Emotional Assessment Appearance:: Appears stated age Attitude/Demeanor/Rapport: Engaged Affect (typically observed): Appropriate Orientation: : Oriented to Self, Oriented to Place, Oriented to  Time, Oriented to Situation Alcohol / Substance Use: Not Applicable Psych Involvement: No (comment)  Admission diagnosis:  Sepsis due to undetermined organism ([place]) [A41.9] Altered mental status, unspecified altered mental status type [R41.82] Sepsis, due to unspecified organism, unspecified whether acute organ dysfunction present [facility]) [A41.9] Patient Active Problem List   Diagnosis Date Noted   Sepsis due to undetermined organism ([place]) 09/19/2021   Hypokalemia 09/19/2021   Leukopenia 09/19/2021   Prolonged QT interval 09/19/2021   Arthritis of right knee    UTI (urinary tract infection) 06/26/2021   Severe sepsis ([place]) 90/24/0973   Acute metabolic encephalopathy 53/29/9242   Hypothyroid 06/25/2021   S/P total knee arthroplasty, right 06/09/2021   Fever    Staphylococcus aureus bacteremia with sepsis ([place])    SIRS (systemic inflammatory response syndrome) ([place]) 11/30/2020   Lumbar radiculopathy 10/10/2019   Bilateral stenosis of lateral recess of lumbar spine 10/10/2019   Hemorrhoids, external without complications 68/34/1962   Depression    Anxiety    Rectal bleeding    History of radiation therapy  Malignant neoplasm of anal canal ([place]) 11/15/2011   Hemorrhoids, internal, with bleeding 09/07/2011   Acquired anal stenosis 09/07/2011   Hyperlipidemia 09/07/2011   Family history of breast cancer in sister 09/07/2011   PCP:  [name], MD Pharmacy:   [place], [place] - [address] AT [facility] [address] [place] [phone] Phone: [phone] Fax: [phone]     Social Determinants of Health (SDOH) Interventions     Readmission Risk Interventions No flowsheet data found.

## 2021-09-23 NOTE — Progress Notes (Signed)
Physical Therapy Treatment Patient Details Name: [name]: [id number] DOB: 04-02-1946 Today's Date: 09/23/2021   History of Present Illness Pt is 74 yo female who presented to ED with altered mental status. PMH significant of anal cancer, anxiety, osteoarthritis, depression, hyperlipidemia, history of rectal bleeding, history of Staphylococcus bacteremia with sepsis    PT Comments    + 2 to stand and pivot  back to bed for a test. Recommned SNF.   Recommendations for follow up therapy are one component of a multi-disciplinary discharge planning process, led by the attending physician.  Recommendations may be updated based on patient status, additional functional criteria and insurance authorization.  Follow Up Recommendations  Skilled nursing-short term rehab (<3 hours/day)     Assistance Recommended at Discharge Frequent or constant Supervision/Assistance  Equipment Recommendations  None recommended by PT    Recommendations for Other Services       Precautions / Restrictions Precautions Precautions: Fall Restrictions Weight Bearing Restrictions: No Other Position/Activity Restrictions: wounds on B feet, per RN wound care consult placed     Mobility  Bed Mobility Overal bed mobility: Needs Assistance Bed Mobility: Sit to Supine Rolling: Min assist     Sit to supine: Mod assist   General bed mobility comments: assist with legs    Transfers Overall transfer level: Needs assistance Equipment used: 2 person hand held assist Transfers: Sit to/from Stand Sit to Stand: Mod assist;+2 physical assistance;+2 safety/equipment     Step pivot transfers: Mod assist;+2 safety/equipment     General transfer comment: asssited to stnad from rcliner and step to bed.    Ambulation/Gait Ambulation/Gait assistance: Mod assist Gait Distance (Feet): 5 Feet Assistive device: Rolling walker (2 wheels) Gait Pattern/deviations: Step-to pattern;Decreased step length -  left;Decreased step length - right;Shuffle       General Gait Details: amb 5 '. difficulty backing up to recliner.mod max support for balance   Stairs             Wheelchair Mobility    Modified Rankin (Stroke Patients Only)       Balance Overall balance assessment: Needs assistance;History of Falls Sitting-balance support: Bilateral upper extremity supported;Feet supported Sitting balance-Leahy Scale: Fair     Standing balance support: Bilateral upper extremity supported;During functional activity;Reliant on assistive device for balance Standing balance-Leahy Scale: Poor Standing balance comment: rw and up to mod A in standing as pt fatigued.                            Cognition Arousal/Alertness: Awake/alert Behavior During Therapy: Flat affect;Anxious Overall Cognitive Status: Impaired/Different from baseline Area of Impairment: Orientation;Attention;Memory;Following commands;Safety/judgement;Awareness;Problem solving                 Orientation Level: Time;Situation Current Attention Level: Focused Memory: Decreased recall of precautions;Decreased short-term memory Following Commands: Follows one step commands inconsistently Safety/Judgement: Decreased awareness of safety;Decreased awareness of deficits Awareness: Intellectual Problem Solving: Slow processing;Decreased initiation;Difficulty sequencing;Requires verbal cues General Comments: Decreased safety awareness. poor problem solving.        Exercises      General Comments        Pertinent Vitals/Pain Pain Assessment: Faces Faces Pain Scale: Hurts little more Pain Location: B feet Pain Descriptors / Indicators: Grimacing;Guarding Pain Intervention(s): Monitored during session;Limited activity within patient's tolerance    Home Living Family/patient expects to be discharged to:: Skilled nursing facility  Additional Comments: Per chart review and pt  report, she lives alone. Daughter lives nearby. Going to OP PT sessions. Pt reports she drives.    Prior Function            PT Goals (current goals can now be found in the care plan section) Acute Rehab PT Goals Patient Stated Goal: go home PT Goal Formulation: Patient unable to participate in goal setting Time For Goal Achievement: 10/07/21 Potential to Achieve Goals: Fair Progress towards PT goals: Progressing toward goals    Frequency    Min 2X/week      PT Plan Current plan remains appropriate    Co-evaluation              AM-PAC PT "6 Clicks" Mobility   Outcome Measure  Help needed turning from your back to your side while in a flat bed without using bedrails?: A Lot Help needed moving from lying on your back to sitting on the side of a flat bed without using bedrails?: A Lot Help needed moving to and from a bed to a chair (including a wheelchair)?: A Lot Help needed standing up from a chair using your arms (e.g., wheelchair or bedside chair)?: A Lot Help needed to walk in hospital room?: Total Help needed climbing 3-5 steps with a railing? : Total 6 Click Score: 10    End of Session Equipment Utilized During Treatment: Gait belt Activity Tolerance: Patient tolerated treatment well;Patient limited by pain Patient left: in bed;with call bell/phone within reach;with nursing/sitter in room Nurse Communication: Mobility status PT Visit Diagnosis: Difficulty in walking, not elsewhere classified (R26.2)     Time: 2707-8675 PT Time Calculation (min) (ACUTE ONLY): 14 min  Charges:  $Therapeutic Activity: 8-22 mins                     [place] Pager [phone] Office [phone]    Claretha Cooper 09/23/2021, 1:11 PM

## 2021-09-24 DIAGNOSIS — A419 Sepsis, unspecified organism: Secondary | ICD-10-CM | POA: Diagnosis not present

## 2021-09-24 LAB — BASIC METABOLIC PANEL
Anion gap: 6 (ref 5–15)
BUN: 15 mg/dL (ref 8–23)
CO2: 27 mmol/L (ref 22–32)
Calcium: 8.3 mg/dL — ABNORMAL LOW (ref 8.9–10.3)
Chloride: 108 mmol/L (ref 98–111)
Creatinine, Ser: 0.71 mg/dL (ref 0.44–1.00)
GFR, Estimated: >60 mL/min (ref >60)
Glucose, Bld: 95 mg/dL (ref 70–99)
Potassium: 3.6 mmol/L (ref 3.5–5.1)
Sodium: 141 mmol/L (ref 135–145)

## 2021-09-24 LAB — CBC
HCT: 30.4 % — ABNORMAL LOW (ref 36.0–46.0)
Hemoglobin: 10 g/dL — ABNORMAL LOW (ref 12.0–15.0)
MCH: 31.5 pg (ref 26.0–34.0)
MCHC: 32.9 g/dL (ref 30.0–36.0)
MCV: 95.9 fL (ref 80.0–100.0)
Platelets: 165 10*3/uL (ref 150–400)
RBC: 3.17 MIL/uL — ABNORMAL LOW (ref 3.87–5.11)
RDW: 15.5 % (ref 11.5–15.5)
WBC: 5.2 10*3/uL (ref 4.0–10.5)
nRBC: 0 % (ref 0.0–0.2)

## 2021-09-24 MED ORDER — SULFAMETHOXAZOLE-TRIMETHOPRIM 800-160 MG PO TABS
1.0000 | ORAL_TABLET | Freq: Two times a day (BID) | ORAL | Status: AC
  Administered 2021-09-24 – 2021-09-26 (×4): 1 via ORAL
  Filled 2021-09-24 (×4): qty 1

## 2021-09-24 NOTE — NC FL2 (Signed)
[place] LEVEL OF CARE SCREENING TOOL     IDENTIFICATION  Patient Name: [name] Birthdate: 01/07/1946 Sex: female Admission Date (Current Location): 09/19/2021  [facility] and [place] Number:  Herbalist and Address:  [facility],  [address], [place]      Provider Number: [id number]  Attending Physician Name and Address:  Gwynne Edinger, MD  Relative Name and Phone Number:  Carilyn Goodpasture (Daughter)   [phone]    Current Level of Care: Hospital Recommended Level of Care: [place] Prior Approval Number:    Date Approved/Denied:   PASRR Number:  pending  Discharge Plan: SNF    Current Diagnoses: Patient Active Problem List   Diagnosis Date Noted   Sepsis due to undetermined organism ([place]) 09/19/2021   Hypokalemia 09/19/2021   Leukopenia 09/19/2021   Prolonged QT interval 09/19/2021   Arthritis of right knee    UTI (urinary tract infection) 06/26/2021   Severe sepsis ([place]) 63/33/5456   Acute metabolic encephalopathy 25/63/8937   Hypothyroid 06/25/2021   S/P total knee arthroplasty, right 06/09/2021   Fever    Staphylococcus aureus bacteremia with sepsis ([place])    SIRS (systemic inflammatory response syndrome) ([place]) 11/30/2020   Lumbar radiculopathy 10/10/2019   Bilateral stenosis of lateral recess of lumbar spine 10/10/2019   Hemorrhoids, external without complications 34/28/7681   Depression    Anxiety    Rectal bleeding    History of radiation therapy    Malignant neoplasm of anal canal ([place]) 11/15/2011   Hemorrhoids, internal, with bleeding 09/07/2011   Acquired anal stenosis 09/07/2011   Hyperlipidemia 09/07/2011   Family history of breast cancer in sister 09/07/2011    Orientation RESPIRATION BLADDER Height & Weight     Self, Time, Situation, Place  Normal External catheter Weight: 72.6 kg Height:  5\' 2"  (157.5 cm)  BEHAVIORAL SYMPTOMS/MOOD NEUROLOGICAL BOWEL NUTRITION  STATUS      Continent Diet (see d/c summary)  AMBULATORY STATUS COMMUNICATION OF NEEDS Skin   Extensive Assist Verbally Normal                       Personal Care Assistance Level of Assistance  Bathing, Feeding, Dressing Bathing Assistance: Maximum assistance Feeding assistance: Independent Dressing Assistance: Limited assistance     Functional Limitations Info  Sight, Hearing, Speech Sight Info: Adequate Hearing Info: Adequate Speech Info: Adequate    SPECIAL CARE FACTORS FREQUENCY  PT (By licensed PT), OT (By licensed OT)     PT Frequency: 5X/W OT Frequency: 5X/W            Contractures Contractures Info: Not present    Additional Factors Info  Code Status, Allergies, Psychotropic Code Status Info: full Allergies Info: NKA Psychotropic Info: Abilify, Cymbalta, Trazodone         Current Medications (09/24/2021):  This is the current hospital active medication list Current Facility-Administered Medications  Medication Dose Route Frequency Provider Last Rate Last Admin   acetaminophen (TYLENOL) tablet 650 mg  650 mg Oral Q6H PRN Donne Hazel, MD   650 mg at 09/23/21 2108   Or   acetaminophen (TYLENOL) suppository 650 mg  650 mg Rectal Q6H PRN Donne Hazel, MD       ARIPiprazole (ABILIFY) tablet 10 mg  10 mg Oral Daily Donne Hazel, MD   10 mg at 09/23/21 1040   aspirin chewable tablet 81 mg  81 mg Oral Daily Donne Hazel,  MD   81 mg at 09/23/21 1040   Chlorhexidine Gluconate Cloth 2 % PADS 6 each  6 each Topical Q0600 Donne Hazel, MD   6 each at 09/24/21 0522   DULoxetine (CYMBALTA) DR capsule 60 mg  60 mg Oral BID Donne Hazel, MD   60 mg at 09/23/21 2108   enoxaparin (LOVENOX) injection 40 mg  40 mg Subcutaneous Q24H Donne Hazel, MD   40 mg at 09/23/21 1911   gabapentin (NEURONTIN) capsule 600 mg  600 mg Oral BID Donne Hazel, MD   600 mg at 09/23/21 2108   levothyroxine (SYNTHROID) tablet 25 mcg  25 mcg Oral QAC breakfast Donne Hazel, MD   25 mcg at 09/24/21 0522   lip balm (CARMEX) ointment   Topical PRN Donne Hazel, MD   1 application at 49/17/91 0930   LORazepam (ATIVAN) injection 1 mg  1 mg Intravenous Q4H PRN Donne Hazel, MD   1 mg at 09/21/21 5056   pravastatin (PRAVACHOL) tablet 40 mg  40 mg Oral Daily Donne Hazel, MD   40 mg at 09/23/21 1040   sulfamethoxazole-trimethoprim (BACTRIM DS) 800-160 MG per tablet 1 tablet  1 tablet Oral Q12H Gwynne Edinger, MD   1 tablet at 09/23/21 2108   traMADol (ULTRAM) tablet 50 mg  50 mg Oral Q8H PRN Donne Hazel, MD       traZODone (DESYREL) tablet 100 mg  100 mg Oral QHS PRN Donne Hazel, MD   100 mg at 09/19/21 2156     Discharge Medications: Please see discharge summary for a list of discharge medications.  Relevant Imaging Results:  Relevant Lab Results:   Additional Information SS#[id number];  [place] COVID-19 Vaccine 01/02/2020 , 12/06/2019, 06/14/21  Trish Mage, LCSW

## 2021-09-24 NOTE — TOC Progression Note (Signed)
Transition of Care [facility]) - Progression Note    Patient Details  Name: [name]: [id number] Date of Birth: August 04, 1946  Transition of Care [facility]) CM/SW [place], [place] Phone Number: 09/24/2021, 10:19 AM  Clinical Narrative:   Patient now agreeable to go to SNF "if I can't get up on my own and walk while here in the hospital."  We spoke with daughter on phone who agrees with plan, requests [facility] services if she is well enough to return home.  Bed search initiated.  Requested paperwork submitted to [facility] MUST. TOC will continue to follow during the course of hospitalization.     Expected Discharge Plan: Skilled Nursing Facility Barriers to Discharge: SNF Pending bed offer  Expected Discharge Plan and Services Expected Discharge Plan: [place] Choice: [place] arrangements for the past 2 months: Single Family Home                                       Social Determinants of Health (SDOH) Interventions    Readmission Risk Interventions No flowsheet data found.

## 2021-09-24 NOTE — Progress Notes (Signed)
PROGRESS NOTE    DESTANI WAMSER  PXT:[id number] DOB: Apr 15, 1946 DOA: 09/19/2021 PCP: [name], MD    Brief Narrative:  75 y.o. female with medical history significant of anal cancer, anxiety, osteoarthritis, depression, hyperlipidemia, history of rectal bleeding, history of Staphylococcus bacteremia with sepsis who is coming to the emergency department due to altered mental status.  She is normally fully oriented. Pt was found to be floridly septic with UTI and later ecoli bacteremia  Assessment & Plan:   Principal Problem:   Sepsis due to undetermined organism [facility]) Active Problems:   Hyperlipidemia   Depression   Anxiety   Acute metabolic encephalopathy   Hypothyroid   Hypokalemia   Leukopenia   Prolonged QT interval  Principal Problem: Acute metabolic encephalopathy secondary to severe Sepsis secondary to ecoli bacteremia with UTI present on admit -Presented floridly septic with leukocytosis, lactic acidosis, tachycardia, tachypena. Sepsis physiology has resolved. Encephalopathy improving but remains confused. -Blood cx pos for ecoli - today day 5 of ceftriaxone, will transition to bactrim, plan to complete 7 days through AM 11/26   Debility Ot advising snf and daughter agreeable - f/u pt consult, have made TOC aware of likely SNF rec  Leukopenia Resolved  - monitor  Hypokalemia Resolved w/ repletion - monitor    Depression with anxiety Continue Cymbalta 60 mg p.o. twice daily. Continue with trazodone 100 mg p.o. at bedtime PRN.   Hypothyroid Continue levothyroxine 25 mcg p.o. daily. Most recent TSH from June 27, 2021 noted to be 3.116  Lactic acidosis -Likely secondary to presenting severe sepsis -Now resolved after IVF hydration  DVT prophylaxis: Lovenox subq Code Status: Full Family Communication: daughter updated telephonically 11/23. No answer when called today; message left.  Status is: Inpatient  Remains inpatient appropriate because:  unsafe d/c plan    Consultants:    Procedures:    Antimicrobials: Anti-infectives (From admission, onward)    Start     Dose/Rate Route Frequency Ordered Stop   09/23/21 1245  sulfamethoxazole-trimethoprim (BACTRIM DS) 800-160 MG per tablet 1 tablet        1 tablet Oral Every 12 hours 09/23/21 1147     09/20/21 1500  vancomycin (VANCOCIN) IVPB 1000 mg/200 mL premix  Status:  Discontinued        1,000 mg 200 mL/hr over 60 Minutes Intravenous Every 24 hours 09/19/21 1536 09/21/21 0734   09/20/21 0300  ceFEPIme (MAXIPIME) 2 g in sodium chloride 0.9 % 100 mL IVPB  Status:  Discontinued        2 g 200 mL/hr over 30 Minutes Intravenous Every 12 hours 09/19/21 1535 09/20/21 0138   09/20/21 0230  cefTRIAXone (ROCEPHIN) 2 g in sodium chloride 0.9 % 100 mL IVPB  Status:  Discontinued        2 g 200 mL/hr over 30 Minutes Intravenous Every 24 hours 09/20/21 0138 09/23/21 1147   09/20/21 0200  metroNIDAZOLE (FLAGYL) IVPB 500 mg  Status:  Discontinued        500 mg 100 mL/hr over 60 Minutes Intravenous Every 12 hours 09/19/21 1526 09/21/21 0734   09/19/21 1415  ceFEPIme (MAXIPIME) 2 g in sodium chloride 0.9 % 100 mL IVPB        2 g 200 mL/hr over 30 Minutes Intravenous  Once 09/19/21 1401 09/19/21 1608   09/19/21 1415  metroNIDAZOLE (FLAGYL) IVPB 500 mg        500 mg 100 mL/hr over 60 Minutes Intravenous  Once 09/19/21 1401 09/19/21 1609  09/19/21 1415  vancomycin (VANCOCIN) IVPB 1000 mg/200 mL premix        1,000 mg 200 mL/hr over 60 Minutes Intravenous  Once 09/19/21 1401 09/19/21 1609       Subjective: awake and conversant today. Tolerating diet. No complaints  Objective: Vitals:   09/24/21 0433 09/24/21 1013 09/24/21 1133 09/24/21 1318  BP: 127/69 (!) 125/59 (!) 125/59 (!) 122/52  Pulse: 78 88 84 87  Resp: 18   20  Temp: 98.4 F (36.9 C) 100 F (37.8 C) 98.7 F (37.1 C) 98.9 F (37.2 C)  TempSrc: Oral Axillary Oral Oral  SpO2: 97% 100% 99% 98%  Weight:      Height:         Intake/Output Summary (Last 24 hours) at 09/24/2021 1416 Last data filed at 09/24/2021 1112 Gross per 24 hour  Intake 1066 ml  Output 1350 ml  Net -284 ml   Filed Weights   09/19/21 1107  Weight: 72.6 kg    Examination: General exam: Conversant, in no acute distress Respiratory system: normal chest rise, clear, no audible wheezing Cardiovascular system: regular rhythm, s1-s2 Gastrointestinal system: Nondistended, nontender, pos BS Central nervous system: No seizures, no tremors Extremities: No cyanosis, no joint deformities Skin: No rashes, no pallor Psychiatry: Affect normal // no auditory hallucinations   Data Reviewed: I have personally reviewed following labs and imaging studies  CBC: Recent Labs  Lab 09/19/21 1132 09/20/21 0535 09/21/21 0309 09/22/21 0444 09/23/21 0500 09/24/21 0516  WBC 1.1* 15.0* 14.5* 10.2 5.7 5.2  NEUTROABS 0.9* 13.4*  --   --   --   --   HGB 12.4 10.2* 10.3* 9.7* 9.4* 10.0*  HCT 38.1 30.6* 32.4* 30.6* 29.8* 30.4*  MCV 96.2 96.5 98.2 97.8 97.7 95.9  PLT 152 141* 110* 136* 147* 546   Basic Metabolic Panel: Recent Labs  Lab 09/20/21 0251 09/21/21 0309 09/22/21 0444 09/23/21 0500 09/24/21 0516  NA 140 145 144 143 141  K 3.8 4.2 3.2* 4.2 3.6  CL 108 110 111 109 108  CO2 21* 23 27 27 27   GLUCOSE 77 55* 128* 89 95  BUN 20 23 20 18 15   CREATININE 0.99 0.80 0.56 0.47 0.71  CALCIUM 7.7* 8.3* 8.3* 8.4* 8.3*   GFR: Estimated Creatinine Clearance: 57.6 mL/min (by C-G formula based on SCr of 0.71 mg/dL). Liver Function Tests: Recent Labs  Lab 09/19/21 1132 09/20/21 0251 09/21/21 0309 09/22/21 0444 09/23/21 0500  AST 36 107* 91* 41 39  ALT 23 66* 62* 47* 35  ALKPHOS 113 70 76 80 74  BILITOT 1.2 0.8 0.8 0.6 1.2  PROT 7.2 5.5* 5.9* 5.9* 5.6*  ALBUMIN 3.9 2.7* 2.8* 2.8* 2.6*   No results for input(s): LIPASE, AMYLASE in the last 168 hours. No results for input(s): AMMONIA in the last 168 hours. Coagulation Profile: No results  for input(s): INR, PROTIME in the last 168 hours. Cardiac Enzymes: No results for input(s): CKTOTAL, CKMB, CKMBINDEX, TROPONINI in the last 168 hours. BNP (last 3 results) No results for input(s): PROBNP in the last 8760 hours. HbA1C: No results for input(s): HGBA1C in the last 72 hours. CBG: Recent Labs  Lab 09/22/21 1606 09/22/21 2229 09/23/21 0712 09/23/21 1128 09/23/21 1635  GLUCAP 82 93 75 109* 76   Lipid Profile: No results for input(s): CHOL, HDL, LDLCALC, TRIG, CHOLHDL, LDLDIRECT in the last 72 hours. Thyroid Function Tests: No results for input(s): TSH, T4TOTAL, FREET4, T3FREE, THYROIDAB in the last 72 hours. Anemia Panel: No  results for input(s): VITAMINB12, FOLATE, FERRITIN, TIBC, IRON, RETICCTPCT in the last 72 hours. Sepsis Labs: Recent Labs  Lab 09/19/21 1618 09/19/21 2000 09/20/21 0859 09/20/21 1150  LATICACIDVEN 4.0* 6.1* 2.5* 1.5    Recent Results (from the past 240 hour(s))  Culture, blood (routine x 2)     Status: Abnormal   Collection Time: 09/19/21 11:32 AM   Specimen: BLOOD  Result Value Ref Range Status   Specimen Description   Final    BLOOD LEFT ANTECUBITAL Performed at [facility], [place] [address], [place], [place] [address]    Special Requests   Final    BOTTLES DRAWN AEROBIC AND ANAEROBIC Blood Culture results may not be optimal due to an excessive volume of blood received in culture bottles Performed at [place] [address], [place],  [place] [address]    Culture  Setup Time   Final    GRAM NEGATIVE RODS ANAEROBIC BOTTLE ONLY CRITICAL RESULT CALLED TO, READ BACK BY AND VERIFIED WITH: PHARMD ELLEN JACKSON 09/20/21@1 :322 BY TW IN BOTH AEROBIC AND ANAEROBIC BOTTLES Performed at [facility], [place] [address], [place], [place] [address]    Culture ESCHERICHIA COLI (A)  Final   Report Status 09/22/2021 FINAL  Final   Organism ID, Bacteria ESCHERICHIA COLI  Final      Susceptibility    Escherichia coli - MIC*    AMPICILLIN >=32 RESISTANT Resistant     CEFAZOLIN 16 SENSITIVE Sensitive     CEFEPIME <=0.12 SENSITIVE Sensitive     CEFTAZIDIME <=1 SENSITIVE Sensitive     CEFTRIAXONE <=0.25 SENSITIVE Sensitive     CIPROFLOXACIN <=0.25 SENSITIVE Sensitive     GENTAMICIN <=1 SENSITIVE Sensitive     IMIPENEM <=0.25 SENSITIVE Sensitive     TRIMETH/SULFA <=20 SENSITIVE Sensitive     AMPICILLIN/SULBACTAM 16 INTERMEDIATE Intermediate     PIP/TAZO <=4 SENSITIVE Sensitive     * ESCHERICHIA COLI  Resp Panel by RT-PCR (Flu A&B, Covid) Nasopharyngeal Swab     Status: None   Collection Time: 09/19/21 11:32 AM   Specimen: Nasopharyngeal Swab; Nasopharyngeal(NP) swabs in vial transport medium  Result Value Ref Range Status   SARS Coronavirus 2 by RT PCR NEGATIVE NEGATIVE Final    Comment: (NOTE) SARS-CoV-2 target nucleic acids are NOT DETECTED.  The SARS-CoV-2 RNA is generally detectable in upper respiratory specimens during the acute phase of infection. The lowest concentration of SARS-CoV-2 viral copies this assay can detect is 138 copies/mL. A negative result does not preclude SARS-Cov-2 infection and should not be used as the sole basis for treatment or other patient management decisions. A negative result may occur with  improper specimen collection/handling, submission of specimen other than nasopharyngeal swab, presence of viral mutation(s) within the areas targeted by this assay, and inadequate number of viral copies(<138 copies/mL). A negative result must be combined with clinical observations, patient history, and epidemiological information. The expected result is Negative.  Fact Sheet for Patients:  EntrepreneurPulse.com.au  Fact Sheet for Healthcare Providers:  IncredibleEmployment.be  This test is no t yet approved or cleared by the [place] FDA and  has been authorized for detection and/or diagnosis of SARS-CoV-2 by FDA  under an Emergency Use Authorization (EUA). This EUA will remain  in effect (meaning this test can be used) for the duration of the COVID-19 declaration under Section 564(b)(1) of the Act, 21 U.S.C.section 360bbb-3(b)(1), unless the authorization is terminated  or revoked sooner.       Influenza  A by PCR NEGATIVE NEGATIVE Final   Influenza B by PCR NEGATIVE NEGATIVE Final    Comment: (NOTE) The Xpert Xpress SARS-CoV-2/FLU/RSV plus assay is intended as an aid in the diagnosis of influenza from Nasopharyngeal swab specimens and should not be used as a sole basis for treatment. Nasal washings and aspirates are unacceptable for Xpert Xpress SARS-CoV-2/FLU/RSV testing.  Fact Sheet for Patients: EntrepreneurPulse.com.au  Fact Sheet for Healthcare Providers: IncredibleEmployment.be  This test is not yet approved or cleared by the [place] FDA and has been authorized for detection and/or diagnosis of SARS-CoV-2 by FDA under an Emergency Use Authorization (EUA). This EUA will remain in effect (meaning this test can be used) for the duration of the COVID-19 declaration under Section 564(b)(1) of the Act, 21 U.S.C. section 360bbb-3(b)(1), unless the authorization is terminated or revoked.  Performed at [facility], [place] [address] [place], [place] [address]   Blood Culture ID Panel (Reflexed)     Status: Abnormal   Collection Time: 09/19/21 11:32 AM  Result Value Ref Range Status   Enterococcus faecalis NOT DETECTED NOT DETECTED Final   Enterococcus Faecium NOT DETECTED NOT DETECTED Final   Listeria monocytogenes NOT DETECTED NOT DETECTED Final   Staphylococcus species NOT DETECTED NOT DETECTED Final   Staphylococcus aureus (BCID) NOT DETECTED NOT DETECTED Final   Staphylococcus epidermidis NOT DETECTED NOT DETECTED Final   Staphylococcus lugdunensis NOT DETECTED NOT DETECTED Final   Streptococcus species NOT DETECTED NOT  DETECTED Final   Streptococcus agalactiae NOT DETECTED NOT DETECTED Final   Streptococcus pneumoniae NOT DETECTED NOT DETECTED Final   Streptococcus pyogenes NOT DETECTED NOT DETECTED Final   A.calcoaceticus-baumannii NOT DETECTED NOT DETECTED Final   Bacteroides fragilis NOT DETECTED NOT DETECTED Final   Enterobacterales DETECTED (A) NOT DETECTED Final    Comment: Enterobacterales represent a large order of gram negative bacteria, not a single organism. CRITICAL RESULT CALLED TO, READ BACK BY AND VERIFIED WITH: [place] 09/20/21@1 :322 BY TW    Enterobacter cloacae complex NOT DETECTED NOT DETECTED Final   Escherichia coli DETECTED (A) NOT DETECTED Final    Comment: CRITICAL RESULT CALLED TO, READ BACK BY AND VERIFIED WITH: PHARMD ELLEN JACKSON 09/20/21@1 :322 BY TW    Klebsiella aerogenes NOT DETECTED NOT DETECTED Final   Klebsiella oxytoca NOT DETECTED NOT DETECTED Final   Klebsiella pneumoniae NOT DETECTED NOT DETECTED Final   Proteus species NOT DETECTED NOT DETECTED Final   Salmonella species NOT DETECTED NOT DETECTED Final   Serratia marcescens NOT DETECTED NOT DETECTED Final   Haemophilus influenzae NOT DETECTED NOT DETECTED Final   Neisseria meningitidis NOT DETECTED NOT DETECTED Final   Pseudomonas aeruginosa NOT DETECTED NOT DETECTED Final   Stenotrophomonas maltophilia NOT DETECTED NOT DETECTED Final   Candida albicans NOT DETECTED NOT DETECTED Final   Candida auris NOT DETECTED NOT DETECTED Final   Candida glabrata NOT DETECTED NOT DETECTED Final   Candida krusei NOT DETECTED NOT DETECTED Final   Candida parapsilosis NOT DETECTED NOT DETECTED Final   Candida tropicalis NOT DETECTED NOT DETECTED Final   Cryptococcus neoformans/gattii NOT DETECTED NOT DETECTED Final   CTX-M ESBL NOT DETECTED NOT DETECTED Final   Carbapenem resistance IMP NOT DETECTED NOT DETECTED Final   Carbapenem resistance KPC NOT DETECTED NOT DETECTED Final   Carbapenem resistance NDM NOT  DETECTED NOT DETECTED Final   Carbapenem resist OXA 48 LIKE NOT DETECTED NOT DETECTED Final   Carbapenem resistance VIM NOT DETECTED NOT DETECTED Final  Comment: Performed at [facility], [place] [address] [place], [place] [address]  Culture, blood (routine x 2)     Status: Abnormal   Collection Time: 09/19/21  1:20 PM   Specimen: BLOOD  Result Value Ref Range Status   Specimen Description   Final    BLOOD BLOOD RIGHT WRIST Performed at [place] [address], [place], [place] [address]    Special Requests   Final    BOTTLES DRAWN AEROBIC AND ANAEROBIC Blood Culture adequate volume Performed at [place] [address] [place], [place] [address]    Culture  Setup Time   Final    GRAM NEGATIVE RODS ANAEROBIC BOTTLE ONLY CRITICAL VALUE NOTED.  VALUE IS CONSISTENT WITH PREVIOUSLY REPORTED AND CALLED VALUE. IN BOTH AEROBIC AND ANAEROBIC BOTTLES    Culture (A)  Final    ESCHERICHIA COLI SUSCEPTIBILITIES PERFORMED ON PREVIOUS CULTURE WITHIN THE LAST 5 DAYS. Performed at [facility], [place] [address] [place], [place] [address]    Report Status 09/22/2021 FINAL  Final  MRSA Next Gen by PCR, Nasal     Status: None   Collection Time: 09/19/21  9:55 PM   Specimen: Nasal Mucosa; Nasal Swab  Result Value Ref Range Status   MRSA by PCR Next Gen NOT DETECTED NOT DETECTED Final    Comment: (NOTE) The GeneXpert MRSA Assay (FDA approved for NASAL specimens only), is one component of a comprehensive MRSA colonization surveillance program. It is not intended to diagnose MRSA infection nor to guide or monitor treatment for MRSA infections. Test performance is not FDA approved in patients less than 51 years old. Performed at [facility], [place] [address], [place], [place] [address]       Radiology Studies: No results found.  Scheduled Meds:  ARIPiprazole  10 mg Oral Daily   aspirin  81 mg Oral Daily    Chlorhexidine Gluconate Cloth  6 each Topical Q0600   DULoxetine  60 mg Oral BID   enoxaparin (LOVENOX) injection  40 mg Subcutaneous Q24H   gabapentin  600 mg Oral BID   levothyroxine  25 mcg Oral QAC breakfast   pravastatin  40 mg Oral Daily   sulfamethoxazole-trimethoprim  1 tablet Oral Q12H   Continuous Infusions:     LOS: 5 days   Desma Maxim, MD [facility] Pager On Amion  If 7PM-7AM, please contact night-coverage 09/24/2021, 2:16 PM

## 2021-09-25 DIAGNOSIS — A419 Sepsis, unspecified organism: Secondary | ICD-10-CM | POA: Diagnosis not present

## 2021-09-25 LAB — BASIC METABOLIC PANEL
Anion gap: 7 (ref 5–15)
BUN: 11 mg/dL (ref 8–23)
CO2: 25 mmol/L (ref 22–32)
Calcium: 8.3 mg/dL — ABNORMAL LOW (ref 8.9–10.3)
Chloride: 107 mmol/L (ref 98–111)
Creatinine, Ser: 0.65 mg/dL (ref 0.44–1.00)
GFR, Estimated: >60 mL/min (ref >60)
Glucose, Bld: 103 mg/dL — ABNORMAL HIGH (ref 70–99)
Potassium: 3.6 mmol/L (ref 3.5–5.1)
Sodium: 139 mmol/L (ref 135–145)

## 2021-09-25 NOTE — Consult Note (Signed)
WOC Nurse Consult Note: Patient receiving care in [place] Reason for Consult: left great toe wound Wound type: There is no open wound today. It is obvious she had a wound on the left great toe, but today it is dark pink, dry, no open areas.  We just need to keep it dry. Pressure Injury POA: Yes/No/NA Measurement: Wound bed: The color of the area is a darker pink than surrounding tissue. The area is not tender. Drainage (amount, consistency, odor) no drainage Periwound: intact Dressing procedure/placement/frequency: Apply iodine from the swabsticks or swab pads from clean utility to left great toe discolored area.  Allow to air dry. Perform each shift. Thank you for the consult.  Discussed plan of care with the patient.  [place] nurse will not follow at this time.  Please re-consult the [place] team if needed.  Val Riles, RN, MSN, CWOCN, CNS-BC, pager [phone]

## 2021-09-25 NOTE — TOC Progression Note (Addendum)
Transition of Care [facility]) - Progression Note    Patient Details  Name: [name]: [id number] Date of Birth: 1946-07-07  Transition of Care [facility]) CM/SW [place], [place] Phone Number: 09/25/2021, 1:22 PM  Clinical Narrative:   Patient in need of skilled nursing facility is also in need of updated PT note, final bed offer confirmation and PASSR level II review, none of which will happen today. She told me earlier that [facility] is her first choice as she has been there twice before and had good care.  She will be here through the weekend unless determined able to go home where she reside alone.  Would then need [facility] referral for PT.    Expected Discharge Plan: Skilled Nursing Facility Barriers to Discharge: SNF Pending bed offer  Expected Discharge Plan and Services Expected Discharge Plan: [place] Choice: [place] arrangements for the past 2 months: Single Family Home                                       Social Determinants of Health (SDOH) Interventions    Readmission Risk Interventions No flowsheet data found.

## 2021-09-25 NOTE — Progress Notes (Addendum)
PROGRESS NOTE    [name]  NUU:[id number] DOB: Jan 07, 1946 DOA: 09/19/2021 PCP: [name], MD    Brief Narrative:  75 y.o. female with medical history significant of anal cancer, anxiety, osteoarthritis, depression, hyperlipidemia, history of rectal bleeding, history of Staphylococcus bacteremia with sepsis who is coming to the emergency department due to altered mental status.  She is normally fully oriented. Pt was found to be floridly septic with UTI and later ecoli bacteremia  Assessment & Plan:   Principal Problem:   Sepsis due to undetermined organism [facility]) Active Problems:   Hyperlipidemia   Depression   Anxiety   Acute metabolic encephalopathy   Hypothyroid   Hypokalemia   Leukopenia   Prolonged QT interval  Principal Problem: Acute metabolic encephalopathy secondary to severe Sepsis secondary to ecoli bacteremia with UTI present on admit -Presented floridly septic with leukocytosis, lactic acidosis, tachycardia, tachypena. Sepsis physiology has resolved. Encephalopathy much improved -Blood cx pos for ecoli - s/p ceftriaxone, have transitioned to bactrim, plan to complete 7 days through AM 11/26    Debility Ot advising snf and daughter agreeable - f/u pt consult, have made TOC aware of likely SNF rec  Leukopenia Resolved  - monitor  Hypokalemia Resolved w/ repletion - monitor    Depression with anxiety Continue Cymbalta 60 mg p.o. twice daily, abilify Continue with trazodone 100 mg p.o. at bedtime PRN.   Hypothyroid Continue levothyroxine 25 mcg p.o. daily. Most recent TSH from June 27, 2021 noted to be 3.116  Lactic acidosis -Likely secondary to presenting severe sepsis -Now resolved after IVF hydration  DVT prophylaxis: Lovenox subq Code Status: Full Family Communication: daughter updated telephonically 11/23. No answer when called today 11/24 or 11/25  Status is: Inpatient  Remains inpatient appropriate because: unsafe d/c plan     Consultants:    Procedures:    Antimicrobials: Anti-infectives (From admission, onward)    Start     Dose/Rate Route Frequency Ordered Stop   09/24/21 2200  sulfamethoxazole-trimethoprim (BACTRIM DS) 800-160 MG per tablet 1 tablet        1 tablet Oral Every 12 hours 09/24/21 1420 09/26/21 2159   09/23/21 1245  sulfamethoxazole-trimethoprim (BACTRIM DS) 800-160 MG per tablet 1 tablet  Status:  Discontinued        1 tablet Oral Every 12 hours 09/23/21 1147 09/24/21 1420   09/20/21 1500  vancomycin (VANCOCIN) IVPB 1000 mg/200 mL premix  Status:  Discontinued        1,000 mg 200 mL/hr over 60 Minutes Intravenous Every 24 hours 09/19/21 1536 09/21/21 0734   09/20/21 0300  ceFEPIme (MAXIPIME) 2 g in sodium chloride 0.9 % 100 mL IVPB  Status:  Discontinued        2 g 200 mL/hr over 30 Minutes Intravenous Every 12 hours 09/19/21 1535 09/20/21 0138   09/20/21 0230  cefTRIAXone (ROCEPHIN) 2 g in sodium chloride 0.9 % 100 mL IVPB  Status:  Discontinued        2 g 200 mL/hr over 30 Minutes Intravenous Every 24 hours 09/20/21 0138 09/23/21 1147   09/20/21 0200  metroNIDAZOLE (FLAGYL) IVPB 500 mg  Status:  Discontinued        500 mg 100 mL/hr over 60 Minutes Intravenous Every 12 hours 09/19/21 1526 09/21/21 0734   09/19/21 1415  ceFEPIme (MAXIPIME) 2 g in sodium chloride 0.9 % 100 mL IVPB        2 g 200 mL/hr over 30 Minutes Intravenous  Once 09/19/21 1401 09/19/21 1608  09/19/21 1415  metroNIDAZOLE (FLAGYL) IVPB 500 mg        500 mg 100 mL/hr over 60 Minutes Intravenous  Once 09/19/21 1401 09/19/21 1609   09/19/21 1415  vancomycin (VANCOCIN) IVPB 1000 mg/200 mL premix        1,000 mg 200 mL/hr over 60 Minutes Intravenous  Once 09/19/21 1401 09/19/21 1609       Subjective: awake and conversant today. Tolerating diet. No complaints  Objective: Vitals:   09/24/21 1133 09/24/21 1318 09/24/21 2134 09/25/21 0426  BP: (!) 125/59 (!) 122/52 128/63 120/65  Pulse: 84 87 91 82  Resp:  20 16  20   Temp: 98.7 F (37.1 C) 98.9 F (37.2 C) 99.8 F (37.7 C) 98 F (36.7 C)  TempSrc: Oral Oral Oral Oral  SpO2: 99% 98% 96% 93%  Weight:      Height:        Intake/Output Summary (Last 24 hours) at 09/25/2021 1333 Last data filed at 09/25/2021 1127 Gross per 24 hour  Intake 830 ml  Output 800 ml  Net 30 ml   Filed Weights   09/19/21 1107  Weight: 72.6 kg    Examination: General exam: Conversant, in no acute distress Respiratory system: normal chest rise, clear, no audible wheezing Cardiovascular system: regular rhythm, s1-s2 Gastrointestinal system: Nondistended, nontender, pos BS Central nervous system: No seizures, no tremors Extremities: No cyanosis, no joint deformities Skin: No rashes, no pallor Psychiatry: Affect normal   Data Reviewed: I have personally reviewed following labs and imaging studies  CBC: Recent Labs  Lab 09/19/21 1132 09/20/21 0535 09/21/21 0309 09/22/21 0444 09/23/21 0500 09/24/21 0516  WBC 1.1* 15.0* 14.5* 10.2 5.7 5.2  NEUTROABS 0.9* 13.4*  --   --   --   --   HGB 12.4 10.2* 10.3* 9.7* 9.4* 10.0*  HCT 38.1 30.6* 32.4* 30.6* 29.8* 30.4*  MCV 96.2 96.5 98.2 97.8 97.7 95.9  PLT 152 141* 110* 136* 147* 774   Basic Metabolic Panel: Recent Labs  Lab 09/21/21 0309 09/22/21 0444 09/23/21 0500 09/24/21 0516 09/25/21 0458  NA 145 144 143 141 139  K 4.2 3.2* 4.2 3.6 3.6  CL 110 111 109 108 107  CO2 23 27 27 27 25   GLUCOSE 55* 128* 89 95 103*  BUN 23 20 18 15 11   CREATININE 0.80 0.56 0.47 0.71 0.65  CALCIUM 8.3* 8.3* 8.4* 8.3* 8.3*   GFR: Estimated Creatinine Clearance: 57.6 mL/min (by C-G formula based on SCr of 0.65 mg/dL). Liver Function Tests: Recent Labs  Lab 09/19/21 1132 09/20/21 0251 09/21/21 0309 09/22/21 0444 09/23/21 0500  AST 36 107* 91* 41 39  ALT 23 66* 62* 47* 35  ALKPHOS 113 70 76 80 74  BILITOT 1.2 0.8 0.8 0.6 1.2  PROT 7.2 5.5* 5.9* 5.9* 5.6*  ALBUMIN 3.9 2.7* 2.8* 2.8* 2.6*   No results for  input(s): LIPASE, AMYLASE in the last 168 hours. No results for input(s): AMMONIA in the last 168 hours. Coagulation Profile: No results for input(s): INR, PROTIME in the last 168 hours. Cardiac Enzymes: No results for input(s): CKTOTAL, CKMB, CKMBINDEX, TROPONINI in the last 168 hours. BNP (last 3 results) No results for input(s): PROBNP in the last 8760 hours. HbA1C: No results for input(s): HGBA1C in the last 72 hours. CBG: Recent Labs  Lab 09/22/21 1606 09/22/21 2229 09/23/21 0712 09/23/21 1128 09/23/21 1635  GLUCAP 82 93 75 109* 76   Lipid Profile: No results for input(s): CHOL, HDL, LDLCALC, TRIG, CHOLHDL,  LDLDIRECT in the last 72 hours. Thyroid Function Tests: No results for input(s): TSH, T4TOTAL, FREET4, T3FREE, THYROIDAB in the last 72 hours. Anemia Panel: No results for input(s): VITAMINB12, FOLATE, FERRITIN, TIBC, IRON, RETICCTPCT in the last 72 hours. Sepsis Labs: Recent Labs  Lab 09/19/21 1618 09/19/21 2000 09/20/21 0859 09/20/21 1150  LATICACIDVEN 4.0* 6.1* 2.5* 1.5    Recent Results (from the past 240 hour(s))  Culture, blood (routine x 2)     Status: Abnormal   Collection Time: 09/19/21 11:32 AM   Specimen: BLOOD  Result Value Ref Range Status   Specimen Description   Final    BLOOD LEFT ANTECUBITAL Performed at [facility], [place] [address], [place], [place] [address]    Special Requests   Final    BOTTLES DRAWN AEROBIC AND ANAEROBIC Blood Culture results may not be optimal due to an excessive volume of blood received in culture bottles Performed at [place] [address], [place], [place] [address]    Culture  Setup Time   Final    GRAM NEGATIVE RODS ANAEROBIC BOTTLE ONLY CRITICAL RESULT CALLED TO, READ BACK BY AND VERIFIED WITH: PHARMD ELLEN JACKSON 09/20/21@1 :322 BY TW IN BOTH AEROBIC AND ANAEROBIC BOTTLES Performed at [facility], [place] [address] [place], [place] [address]    Culture  ESCHERICHIA COLI (A)  Final   Report Status 09/22/2021 FINAL  Final   Organism ID, Bacteria ESCHERICHIA COLI  Final      Susceptibility   Escherichia coli - MIC*    AMPICILLIN >=32 RESISTANT Resistant     CEFAZOLIN 16 SENSITIVE Sensitive     CEFEPIME <=0.12 SENSITIVE Sensitive     CEFTAZIDIME <=1 SENSITIVE Sensitive     CEFTRIAXONE <=0.25 SENSITIVE Sensitive     CIPROFLOXACIN <=0.25 SENSITIVE Sensitive     GENTAMICIN <=1 SENSITIVE Sensitive     IMIPENEM <=0.25 SENSITIVE Sensitive     TRIMETH/SULFA <=20 SENSITIVE Sensitive     AMPICILLIN/SULBACTAM 16 INTERMEDIATE Intermediate     PIP/TAZO <=4 SENSITIVE Sensitive     * ESCHERICHIA COLI  Resp Panel by RT-PCR (Flu A&B, Covid) Nasopharyngeal Swab     Status: None   Collection Time: 09/19/21 11:32 AM   Specimen: Nasopharyngeal Swab; Nasopharyngeal(NP) swabs in vial transport medium  Result Value Ref Range Status   SARS Coronavirus 2 by RT PCR NEGATIVE NEGATIVE Final    Comment: (NOTE) SARS-CoV-2 target nucleic acids are NOT DETECTED.  The SARS-CoV-2 RNA is generally detectable in upper respiratory specimens during the acute phase of infection. The lowest concentration of SARS-CoV-2 viral copies this assay can detect is 138 copies/mL. A negative result does not preclude SARS-Cov-2 infection and should not be used as the sole basis for treatment or other patient management decisions. A negative result may occur with  improper specimen collection/handling, submission of specimen other than nasopharyngeal swab, presence of viral mutation(s) within the areas targeted by this assay, and inadequate number of viral copies(<138 copies/mL). A negative result must be combined with clinical observations, patient history, and epidemiological information. The expected result is Negative.  Fact Sheet for Patients:  EntrepreneurPulse.com.au  Fact Sheet for Healthcare Providers:  IncredibleEmployment.be  This  test is no t yet approved or cleared by the [place] FDA and  has been authorized for detection and/or diagnosis of SARS-CoV-2 by FDA under an Emergency Use Authorization (EUA). This EUA will remain  in effect (meaning this test can be used) for the duration of the COVID-19  declaration under Section 564(b)(1) of the Act, 21 U.S.C.section 360bbb-3(b)(1), unless the authorization is terminated  or revoked sooner.       Influenza A by PCR NEGATIVE NEGATIVE Final   Influenza B by PCR NEGATIVE NEGATIVE Final    Comment: (NOTE) The Xpert Xpress SARS-CoV-2/FLU/RSV plus assay is intended as an aid in the diagnosis of influenza from Nasopharyngeal swab specimens and should not be used as a sole basis for treatment. Nasal washings and aspirates are unacceptable for Xpert Xpress SARS-CoV-2/FLU/RSV testing.  Fact Sheet for Patients: EntrepreneurPulse.com.au  Fact Sheet for Healthcare Providers: IncredibleEmployment.be  This test is not yet approved or cleared by the [place] FDA and has been authorized for detection and/or diagnosis of SARS-CoV-2 by FDA under an Emergency Use Authorization (EUA). This EUA will remain in effect (meaning this test can be used) for the duration of the COVID-19 declaration under Section 564(b)(1) of the Act, 21 U.S.C. section 360bbb-3(b)(1), unless the authorization is terminated or revoked.  Performed at [facility], [place] [address], [place], [place] [address]   Blood Culture ID Panel (Reflexed)     Status: Abnormal   Collection Time: 09/19/21 11:32 AM  Result Value Ref Range Status   Enterococcus faecalis NOT DETECTED NOT DETECTED Final   Enterococcus Faecium NOT DETECTED NOT DETECTED Final   Listeria monocytogenes NOT DETECTED NOT DETECTED Final   Staphylococcus species NOT DETECTED NOT DETECTED Final   Staphylococcus aureus (BCID) NOT DETECTED NOT DETECTED Final   Staphylococcus  epidermidis NOT DETECTED NOT DETECTED Final   Staphylococcus lugdunensis NOT DETECTED NOT DETECTED Final   Streptococcus species NOT DETECTED NOT DETECTED Final   Streptococcus agalactiae NOT DETECTED NOT DETECTED Final   Streptococcus pneumoniae NOT DETECTED NOT DETECTED Final   Streptococcus pyogenes NOT DETECTED NOT DETECTED Final   A.calcoaceticus-baumannii NOT DETECTED NOT DETECTED Final   Bacteroides fragilis NOT DETECTED NOT DETECTED Final   Enterobacterales DETECTED (A) NOT DETECTED Final    Comment: Enterobacterales represent a large order of gram negative bacteria, not a single organism. CRITICAL RESULT CALLED TO, READ BACK BY AND VERIFIED WITH: PHARMD ELLEN JACKSON 09/20/21@1 :322 BY TW    Enterobacter cloacae complex NOT DETECTED NOT DETECTED Final   Escherichia coli DETECTED (A) NOT DETECTED Final    Comment: CRITICAL RESULT CALLED TO, READ BACK BY AND VERIFIED WITH: PHARMD ELLEN JACKSON 09/20/21@1 :322 BY TW    Klebsiella aerogenes NOT DETECTED NOT DETECTED Final   Klebsiella oxytoca NOT DETECTED NOT DETECTED Final   Klebsiella pneumoniae NOT DETECTED NOT DETECTED Final   Proteus species NOT DETECTED NOT DETECTED Final   Salmonella species NOT DETECTED NOT DETECTED Final   Serratia marcescens NOT DETECTED NOT DETECTED Final   Haemophilus influenzae NOT DETECTED NOT DETECTED Final   Neisseria meningitidis NOT DETECTED NOT DETECTED Final   Pseudomonas aeruginosa NOT DETECTED NOT DETECTED Final   Stenotrophomonas maltophilia NOT DETECTED NOT DETECTED Final   Candida albicans NOT DETECTED NOT DETECTED Final   Candida auris NOT DETECTED NOT DETECTED Final   Candida glabrata NOT DETECTED NOT DETECTED Final   Candida krusei NOT DETECTED NOT DETECTED Final   Candida parapsilosis NOT DETECTED NOT DETECTED Final   Candida tropicalis NOT DETECTED NOT DETECTED Final   Cryptococcus neoformans/gattii NOT DETECTED NOT DETECTED Final   CTX-M ESBL NOT DETECTED NOT DETECTED Final    Carbapenem resistance IMP NOT DETECTED NOT DETECTED Final   Carbapenem resistance KPC NOT DETECTED NOT DETECTED Final   Carbapenem resistance NDM NOT DETECTED NOT  DETECTED Final   Carbapenem resist OXA 48 LIKE NOT DETECTED NOT DETECTED Final   Carbapenem resistance VIM NOT DETECTED NOT DETECTED Final    Comment: Performed at [facility], [place] [address] [place], [place] [address]  Culture, blood (routine x 2)     Status: Abnormal   Collection Time: 09/19/21  1:20 PM   Specimen: BLOOD  Result Value Ref Range Status   Specimen Description   Final    BLOOD BLOOD RIGHT WRIST Performed at [facility] [address] [place], [place] [address]    Special Requests   Final    BOTTLES DRAWN AEROBIC AND ANAEROBIC Blood Culture adequate volume Performed at [facility] [address] [place], [place] [address]    Culture  Setup Time   Final    GRAM NEGATIVE RODS ANAEROBIC BOTTLE ONLY CRITICAL VALUE NOTED.  VALUE IS CONSISTENT WITH PREVIOUSLY REPORTED AND CALLED VALUE. IN BOTH AEROBIC AND ANAEROBIC BOTTLES    Culture (A)  Final    ESCHERICHIA COLI SUSCEPTIBILITIES PERFORMED ON PREVIOUS CULTURE WITHIN THE LAST 5 DAYS. Performed at [facility], [place] [address] [place], [place] [address]    Report Status 09/22/2021 FINAL  Final  MRSA Next Gen by PCR, Nasal     Status: None   Collection Time: 09/19/21  9:55 PM   Specimen: Nasal Mucosa; Nasal Swab  Result Value Ref Range Status   MRSA by PCR Next Gen NOT DETECTED NOT DETECTED Final    Comment: (NOTE) The GeneXpert MRSA Assay (FDA approved for NASAL specimens only), is one component of a comprehensive MRSA colonization surveillance program. It is not intended to diagnose MRSA infection nor to guide or monitor treatment for MRSA infections. Test performance is not FDA approved in patients less than 34 years old. Performed at [facility], [place] [address], [place], [place] [address]       Radiology Studies: No results found.  Scheduled Meds:  ARIPiprazole  10 mg Oral Daily   aspirin  81 mg Oral Daily   Chlorhexidine Gluconate Cloth  6 each Topical Q0600   DULoxetine  60 mg Oral BID   enoxaparin (LOVENOX) injection  40 mg Subcutaneous Q24H   gabapentin  600 mg Oral BID   levothyroxine  25 mcg Oral QAC breakfast   pravastatin  40 mg Oral Daily   sulfamethoxazole-trimethoprim  1 tablet Oral Q12H   Continuous Infusions:     LOS: 6 days   Desma Maxim, MD [facility] Pager On Amion  If 7PM-7AM, please contact night-coverage 09/25/2021, 1:33 PM

## 2021-09-25 NOTE — Care Management Important Message (Signed)
Important Message  Patient Details IM Letter given to the Patient. Name: [name]: [id number] Date of Birth: [birth year]   Medicare Important Message Given:  Yes     Kerin Salen 09/25/2021, 1:07 PM

## 2021-09-26 DIAGNOSIS — A419 Sepsis, unspecified organism: Secondary | ICD-10-CM | POA: Diagnosis not present

## 2021-09-26 LAB — BASIC METABOLIC PANEL
Anion gap: 9 (ref 5–15)
BUN: 13 mg/dL (ref 8–23)
CO2: 24 mmol/L (ref 22–32)
Calcium: 8.6 mg/dL — ABNORMAL LOW (ref 8.9–10.3)
Chloride: 103 mmol/L (ref 98–111)
Creatinine, Ser: 0.77 mg/dL (ref 0.44–1.00)
GFR, Estimated: >60 mL/min (ref >60)
Glucose, Bld: 97 mg/dL (ref 70–99)
Potassium: 3.9 mmol/L (ref 3.5–5.1)
Sodium: 136 mmol/L (ref 135–145)

## 2021-09-26 NOTE — Progress Notes (Signed)
Physical Therapy Treatment Patient Details Name: [name]: [id number] DOB: 28-Aug-1946 Today's Date: 09/26/2021   History of Present Illness Pt is 75 yo female who presented to ED with altered mental status. PMH significant of anal cancer, anxiety, osteoarthritis, depression, hyperlipidemia, history of rectal bleeding, history of Staphylococcus bacteremia with sepsis    PT Comments    General Comments: Cognition improved from UTI.  AxO x 3 pleasant Lady.  Able to recall past events.  Wants to go home to her Puppy.  After PT session, pt now agreeable to ST Rehab at [facility].  Assisted OOB.  General transfer comment: from elevated bed able to stand with much use of B UE's.  Initially unsteady and present with poor forward flex posture.  Used walker for increased stability.  Also assisted with a toilet transfer.  Required increased assist to rise from lower level.  Required Min Assist for balance as pt attempted to Don/Doff underware and unable to maintain a safe static balance.  Esp unstable with turns and back gait.General Gait Details: first amb with walker to bathroom 8 feet at [facility] [place] present with short shuffled steps and poor forward flex posture.  C/O R knee stiffness.  Also, required 50% VC's for proper sequencing to advance R LE first(before L LE).  Assisted with amb in hallway trial without walker as prior to hospital pt was not using.  Increased instability and shortened steps, pt was unable to progress past 2 feet.  Very unsteady and present with poor delayed self correction reaction.  HIGH FALL RISK even with walker. Pt will need ST Rehab at SNF prior to safely returning home alone.   Recommendations for follow up therapy are one component of a multi-disciplinary discharge planning process, led by the attending physician.  Recommendations may be updated based on patient status, additional functional criteria and insurance authorization.  Follow Up Recommendations  Skilled  nursing-short term rehab (<3 hours/day)     Assistance Recommended at Discharge Frequent or constant Supervision/Assistance  Equipment Recommendations  None recommended by PT    Recommendations for Other Services       Precautions / Restrictions Precautions Precautions: Fall Precaution Comments: TKR 8/22 was still going to OP Restrictions Other Position/Activity Restrictions: -     Mobility  Bed Mobility Overal bed mobility: Needs Assistance Bed Mobility: Supine to Sit Rolling: Modified independent (Device/Increase time);Supervision         General bed mobility comments: pt self able to transition to EOB with increased time    Transfers Overall transfer level: Needs assistance   Transfers: Sit to/from Stand Sit to Stand: Min assist;Min guard           General transfer comment: from elevated bed able to stand with much use of B UE's.  Initially unsteady and present with poor forward flex posture.  Used walker for increased stability.  Also assisted with a toilet transfer.  Required increased assist to rise from lower level.  Required Min Assist for balance as pt attempted to Don/Doff underware and unable to maintain a safe static balance.  Esp unstable with turns and back gait.    Ambulation/Gait Ambulation/Gait assistance: Min assist;Mod assist;Max assist Gait Distance (Feet): 22 Feet Assistive device: Rolling walker (2 wheels);None Gait Pattern/deviations: Step-to pattern;Decreased step length - left;Decreased step length - right;Shuffle Gait velocity: decreased     General Gait Details: first amb with walker to bathroom 8 feet at [place] present with short shuffled steps and poor forward flex posture.  C/O R knee stiffness.  Also, required 50% VC's for proper sequencing to advance R LE first(before L LE).  Assisted with amb in hallway trial without walker as prior to hospital pt was not using.  Increased instability and shortened steps, pt was unable to  progress past 2 feet.  Very unsteady and present with poor delayed self correction reaction.  HIGH FALL RISK even with walker.   Stairs             Wheelchair Mobility    Modified Rankin (Stroke Patients Only)       Balance                                            Cognition Arousal/Alertness: Awake/alert Behavior During Therapy: WFL for tasks assessed/performed Overall Cognitive Status: Within Functional Limits for tasks assessed                                 General Comments: Cognition improved from UTI.  AxO x 3 pleasant Lady.  Able to recall past events.  Wants to go home to her Puppy.  After PT session, pt now agreeable to ST Re[facility] at [facility].        Exercises      General Comments        Pertinent Vitals/Pain Pain Assessment: Faces Faces Pain Scale: Hurts a little bit Pain Location: B feet and R knee "stiff" Pain Descriptors / Indicators: Grimacing;Guarding Pain Intervention(s): Monitored during session;Repositioned    Home Living                          Prior Function            PT Goals (current goals can now be found in the care plan section) Progress towards PT goals: Progressing toward goals    Frequency    Min 2X/week      PT Plan Current plan remains appropriate    Co-evaluation              AM-PAC PT "6 Clicks" Mobility   Outcome Measure  Help needed turning from your back to your side while in a flat bed without using bedrails?: A Little Help needed moving from lying on your back to sitting on the side of a flat bed without using bedrails?: A Little Help needed moving to and from a bed to a chair (including a wheelchair)?: A Lot Help needed standing up from a chair using your arms (e.g., wheelchair or bedside chair)?: A Lot Help needed to walk in hospital room?: A Lot Help needed climbing 3-5 steps with a railing? : Total 6 Click Score: 13    End of Session Equipment Utilized  During Treatment: Gait belt Activity Tolerance: Patient limited by fatigue Patient left: in chair;with call bell/phone within reach;with chair alarm set Nurse Communication: Mobility status PT Visit Diagnosis: Difficulty in walking, not elsewhere classified (R26.2)     Time: 5397-6734 PT Time Calculation (min) (ACUTE ONLY): 33 min  Charges:  $Gait Training: 8-22 mins $Therapeutic Activity: 8-22 mins                     {Azaiah Mello  PTA Acute  Environmental education officer      [phone] Office  [phone]  

## 2021-09-26 NOTE — Progress Notes (Signed)
PROGRESS NOTE    [name]  VEL:[id number] DOB: Nov 30, 1945 DOA: 09/19/2021 PCP: [name], MD    Brief Narrative:  75 y.o. female with medical history significant of anal cancer, anxiety, osteoarthritis, depression, hyperlipidemia, history of rectal bleeding, history of Staphylococcus bacteremia with sepsis who is coming to the emergency department due to altered mental status.  She is normally fully oriented. Pt was found to be floridly septic with UTI and later ecoli bacteremia  Assessment & Plan:   Principal Problem:   Sepsis due to undetermined organism [facility]) Active Problems:   Hyperlipidemia   Depression   Anxiety   Acute metabolic encephalopathy   Hypothyroid   Hypokalemia   Leukopenia   Prolonged QT interval  Principal Problem: Acute metabolic encephalopathy secondary to severe Sepsis secondary to ecoli bacteremia with UTI present on admit -Presented floridly septic with leukocytosis, lactic acidosis, tachycardia, tachypena. Sepsis physiology has resolved. Encephalopathy much improved -Blood cx pos for ecoli - s/p ceftriaxone, have transitioned to bactrim, has now completed a 7 day course    Debility PT advising snf, toc working on that  Leukopenia Resolved  - monitor  Hypokalemia Resolved w/ repletion - monitor    Depression with anxiety Continue Cymbalta 60 mg p.o. twice daily, abilify Continue with trazodone 100 mg p.o. at bedtime PRN.   Hypothyroid Continue levothyroxine 25 mcg p.o. daily. Most recent TSH from June 27, 2021 noted to be 3.116  Lactic acidosis -Likely secondary to presenting severe sepsis -Now resolved after IVF hydration  DVT prophylaxis: Lovenox subq Code Status: Full Family Communication: daughter updated telephonically 11/23. No answer when called today 11/24 and onward including today. Left message asking her to call the hospital and ask to speak to me.  Status is: Inpatient  Remains inpatient appropriate  because: unsafe d/c plan    Consultants:    Procedures:    Antimicrobials: Anti-infectives (From admission, onward)    Start     Dose/Rate Route Frequency Ordered Stop   09/24/21 2200  sulfamethoxazole-trimethoprim (BACTRIM DS) 800-160 MG per tablet 1 tablet        1 tablet Oral Every 12 hours 09/24/21 1420 09/26/21 1003   09/23/21 1245  sulfamethoxazole-trimethoprim (BACTRIM DS) 800-160 MG per tablet 1 tablet  Status:  Discontinued        1 tablet Oral Every 12 hours 09/23/21 1147 09/24/21 1420   09/20/21 1500  vancomycin (VANCOCIN) IVPB 1000 mg/200 mL premix  Status:  Discontinued        1,000 mg 200 mL/hr over 60 Minutes Intravenous Every 24 hours 09/19/21 1536 09/21/21 0734   09/20/21 0300  ceFEPIme (MAXIPIME) 2 g in sodium chloride 0.9 % 100 mL IVPB  Status:  Discontinued        2 g 200 mL/hr over 30 Minutes Intravenous Every 12 hours 09/19/21 1535 09/20/21 0138   09/20/21 0230  cefTRIAXone (ROCEPHIN) 2 g in sodium chloride 0.9 % 100 mL IVPB  Status:  Discontinued        2 g 200 mL/hr over 30 Minutes Intravenous Every 24 hours 09/20/21 0138 09/23/21 1147   09/20/21 0200  metroNIDAZOLE (FLAGYL) IVPB 500 mg  Status:  Discontinued        500 mg 100 mL/hr over 60 Minutes Intravenous Every 12 hours 09/19/21 1526 09/21/21 0734   09/19/21 1415  ceFEPIme (MAXIPIME) 2 g in sodium chloride 0.9 % 100 mL IVPB        2 g 200 mL/hr over 30 Minutes Intravenous  Once  09/19/21 1401 09/19/21 1608   09/19/21 1415  metroNIDAZOLE (FLAGYL) IVPB 500 mg        500 mg 100 mL/hr over 60 Minutes Intravenous  Once 09/19/21 1401 09/19/21 1609   09/19/21 1415  vancomycin (VANCOCIN) IVPB 1000 mg/200 mL premix        1,000 mg 200 mL/hr over 60 Minutes Intravenous  Once 09/19/21 1401 09/19/21 1609       Subjective: awake and conversant today. Tolerating diet. No complaints  Objective: Vitals:   09/25/21 1503 09/25/21 2233 09/26/21 0621 09/26/21 1401  BP: 128/76 (!) 144/70 125/68 (!) 118/52   Pulse: 85 85 77 70  Resp: 20 16 20 20   Temp: 98.6 F (37 C) 98.9 F (37.2 C) 98.4 F (36.9 C) 98.5 F (36.9 C)  TempSrc: Oral Oral Oral Oral  SpO2: 99% 95% 98% 100%  Weight:      Height:        Intake/Output Summary (Last 24 hours) at 09/26/2021 1541 Last data filed at 09/26/2021 0900 Gross per 24 hour  Intake --  Output 2200 ml  Net -2200 ml   Filed Weights   09/19/21 1107  Weight: 72.6 kg    Examination: General exam: Conversant, in no acute distress Respiratory system: normal chest rise, clear, no audible wheezing Cardiovascular system: regular rhythm, s1-s2 Gastrointestinal system: Nondistended, nontender, pos BS Central nervous system: No seizures, no tremors Extremities: No cyanosis, no joint deformities Skin: No rashes, no pallor Psychiatry: Affect normal   Data Reviewed: I have personally reviewed following labs and imaging studies  CBC: Recent Labs  Lab 09/20/21 0535 09/21/21 0309 09/22/21 0444 09/23/21 0500 09/24/21 0516  WBC 15.0* 14.5* 10.2 5.7 5.2  NEUTROABS 13.4*  --   --   --   --   HGB 10.2* 10.3* 9.7* 9.4* 10.0*  HCT 30.6* 32.4* 30.6* 29.8* 30.4*  MCV 96.5 98.2 97.8 97.7 95.9  PLT 141* 110* 136* 147* 497   Basic Metabolic Panel: Recent Labs  Lab 09/22/21 0444 09/23/21 0500 09/24/21 0516 09/25/21 0458 09/26/21 0531  NA 144 143 141 139 136  K 3.2* 4.2 3.6 3.6 3.9  CL 111 109 108 107 103  CO2 27 27 27 25 24   GLUCOSE 128* 89 95 103* 97  BUN 20 18 15 11 13   CREATININE 0.56 0.47 0.71 0.65 0.77  CALCIUM 8.3* 8.4* 8.3* 8.3* 8.6*   GFR: Estimated Creatinine Clearance: 57.6 mL/min (by C-G formula based on SCr of 0.77 mg/dL). Liver Function Tests: Recent Labs  Lab 09/20/21 0251 09/21/21 0309 09/22/21 0444 09/23/21 0500  AST 107* 91* 41 39  ALT 66* 62* 47* 35  ALKPHOS 70 76 80 74  BILITOT 0.8 0.8 0.6 1.2  PROT 5.5* 5.9* 5.9* 5.6*  ALBUMIN 2.7* 2.8* 2.8* 2.6*   No results for input(s): LIPASE, AMYLASE in the last 168  hours. No results for input(s): AMMONIA in the last 168 hours. Coagulation Profile: No results for input(s): INR, PROTIME in the last 168 hours. Cardiac Enzymes: No results for input(s): CKTOTAL, CKMB, CKMBINDEX, TROPONINI in the last 168 hours. BNP (last 3 results) No results for input(s): PROBNP in the last 8760 hours. HbA1C: No results for input(s): HGBA1C in the last 72 hours. CBG: Recent Labs  Lab 09/22/21 1606 09/22/21 2229 09/23/21 0712 09/23/21 1128 09/23/21 1635  GLUCAP 82 93 75 109* 76   Lipid Profile: No results for input(s): CHOL, HDL, LDLCALC, TRIG, CHOLHDL, LDLDIRECT in the last 72 hours. Thyroid Function Tests: No results  for input(s): TSH, T4TOTAL, FREET4, T3FREE, THYROIDAB in the last 72 hours. Anemia Panel: No results for input(s): VITAMINB12, FOLATE, FERRITIN, TIBC, IRON, RETICCTPCT in the last 72 hours. Sepsis Labs: Recent Labs  Lab 09/19/21 1618 09/19/21 2000 09/20/21 0859 09/20/21 1150  LATICACIDVEN 4.0* 6.1* 2.5* 1.5    Recent Results (from the past 240 hour(s))  Culture, blood (routine x 2)     Status: Abnormal   Collection Time: 09/19/21 11:32 AM   Specimen: BLOOD  Result Value Ref Range Status   Specimen Description   Final    BLOOD LEFT ANTECUBITAL Performed at [facility], [place] [address], [place], [place] [address]    Special Requests   Final    BOTTLES DRAWN AEROBIC AND ANAEROBIC Blood Culture results may not be optimal due to an excessive volume of blood received in culture bottles Performed at [place] [address], [place], [place] [address]    Culture  Setup Time   Final    GRAM NEGATIVE RODS ANAEROBIC BOTTLE ONLY CRITICAL RESULT CALLED TO, READ BACK BY AND VERIFIED WITH: PHARMD ELLEN JACKSON 09/20/21@1 :322 BY TW IN BOTH AEROBIC AND ANAEROBIC BOTTLES Performed at [facility], [place] [address] [place], [place] [address]    Culture ESCHERICHIA COLI (A)  Final   Report Status  09/22/2021 FINAL  Final   Organism ID, Bacteria ESCHERICHIA COLI  Final      Susceptibility   Escherichia coli - MIC*    AMPICILLIN >=32 RESISTANT Resistant     CEFAZOLIN 16 SENSITIVE Sensitive     CEFEPIME <=0.12 SENSITIVE Sensitive     CEFTAZIDIME <=1 SENSITIVE Sensitive     CEFTRIAXONE <=0.25 SENSITIVE Sensitive     CIPROFLOXACIN <=0.25 SENSITIVE Sensitive     GENTAMICIN <=1 SENSITIVE Sensitive     IMIPENEM <=0.25 SENSITIVE Sensitive     TRIMETH/SULFA <=20 SENSITIVE Sensitive     AMPICILLIN/SULBACTAM 16 INTERMEDIATE Intermediate     PIP/TAZO <=4 SENSITIVE Sensitive     * ESCHERICHIA COLI  Resp Panel by RT-PCR (Flu A&B, Covid) Nasopharyngeal Swab     Status: None   Collection Time: 09/19/21 11:32 AM   Specimen: Nasopharyngeal Swab; Nasopharyngeal(NP) swabs in vial transport medium  Result Value Ref Range Status   SARS Coronavirus 2 by RT PCR NEGATIVE NEGATIVE Final    Comment: (NOTE) SARS-CoV-2 target nucleic acids are NOT DETECTED.  The SARS-CoV-2 RNA is generally detectable in upper respiratory specimens during the acute phase of infection. The lowest concentration of SARS-CoV-2 viral copies this assay can detect is 138 copies/mL. A negative result does not preclude SARS-Cov-2 infection and should not be used as the sole basis for treatment or other patient management decisions. A negative result may occur with  improper specimen collection/handling, submission of specimen other than nasopharyngeal swab, presence of viral mutation(s) within the areas targeted by this assay, and inadequate number of viral copies(<138 copies/mL). A negative result must be combined with clinical observations, patient history, and epidemiological information. The expected result is Negative.  Fact Sheet for Patients:  EntrepreneurPulse.com.au  Fact Sheet for Healthcare Providers:  IncredibleEmployment.be  This test is no t yet approved or cleared by the  [place] FDA and  has been authorized for detection and/or diagnosis of SARS-CoV-2 by FDA under an Emergency Use Authorization (EUA). This EUA will remain  in effect (meaning this test can be used) for the duration of the COVID-19 declaration under Section 564(b)(1) of the Act, 21 U.S.C.section 360bbb-3(b)(1), unless  the authorization is terminated  or revoked sooner.       Influenza A by PCR NEGATIVE NEGATIVE Final   Influenza B by PCR NEGATIVE NEGATIVE Final    Comment: (NOTE) The Xpert Xpress SARS-CoV-2/FLU/RSV plus assay is intended as an aid in the diagnosis of influenza from Nasopharyngeal swab specimens and should not be used as a sole basis for treatment. Nasal washings and aspirates are unacceptable for Xpert Xpress SARS-CoV-2/FLU/RSV testing.  Fact Sheet for Patients: EntrepreneurPulse.com.au  Fact Sheet for Healthcare Providers: IncredibleEmployment.be  This test is not yet approved or cleared by the [place] FDA and has been authorized for detection and/or diagnosis of SARS-CoV-2 by FDA under an Emergency Use Authorization (EUA). This EUA will remain in effect (meaning this test can be used) for the duration of the COVID-19 declaration under Section 564(b)(1) of the Act, 21 U.S.C. section 360bbb-3(b)(1), unless the authorization is terminated or revoked.  Performed at [facility], [place] [address] [place], [place] [address]   Blood Culture ID Panel (Reflexed)     Status: Abnormal   Collection Time: 09/19/21 11:32 AM  Result Value Ref Range Status   Enterococcus faecalis NOT DETECTED NOT DETECTED Final   Enterococcus Faecium NOT DETECTED NOT DETECTED Final   Listeria monocytogenes NOT DETECTED NOT DETECTED Final   Staphylococcus species NOT DETECTED NOT DETECTED Final   Staphylococcus aureus (BCID) NOT DETECTED NOT DETECTED Final   Staphylococcus epidermidis NOT DETECTED NOT DETECTED Final    Staphylococcus lugdunensis NOT DETECTED NOT DETECTED Final   Streptococcus species NOT DETECTED NOT DETECTED Final   Streptococcus agalactiae NOT DETECTED NOT DETECTED Final   Streptococcus pneumoniae NOT DETECTED NOT DETECTED Final   Streptococcus pyogenes NOT DETECTED NOT DETECTED Final   A.calcoaceticus-baumannii NOT DETECTED NOT DETECTED Final   Bacteroides fragilis NOT DETECTED NOT DETECTED Final   Enterobacterales DETECTED (A) NOT DETECTED Final    Comment: Enterobacterales represent a large order of gram negative bacteria, not a single organism. CRITICAL RESULT CALLED TO, READ BACK BY AND VERIFIED WITH: PHARMD ELLEN JACKSON 09/20/21@1 :322 BY TW    Enterobacter cloacae complex NOT DETECTED NOT DETECTED Final   Escherichia coli DETECTED (A) NOT DETECTED Final    Comment: CRITICAL RESULT CALLED TO, READ BACK BY AND VERIFIED WITH: PHARMD ELLEN JACKSON 09/20/21@1 :322 BY TW    Klebsiella aerogenes NOT DETECTED NOT DETECTED Final   Klebsiella oxytoca NOT DETECTED NOT DETECTED Final   Klebsiella pneumoniae NOT DETECTED NOT DETECTED Final   Proteus species NOT DETECTED NOT DETECTED Final   Salmonella species NOT DETECTED NOT DETECTED Final   Serratia marcescens NOT DETECTED NOT DETECTED Final   Haemophilus influenzae NOT DETECTED NOT DETECTED Final   Neisseria meningitidis NOT DETECTED NOT DETECTED Final   Pseudomonas aeruginosa NOT DETECTED NOT DETECTED Final   Stenotrophomonas maltophilia NOT DETECTED NOT DETECTED Final   Candida albicans NOT DETECTED NOT DETECTED Final   Candida auris NOT DETECTED NOT DETECTED Final   Candida glabrata NOT DETECTED NOT DETECTED Final   Candida krusei NOT DETECTED NOT DETECTED Final   Candida parapsilosis NOT DETECTED NOT DETECTED Final   Candida tropicalis NOT DETECTED NOT DETECTED Final   Cryptococcus neoformans/gattii NOT DETECTED NOT DETECTED Final   CTX-M ESBL NOT DETECTED NOT DETECTED Final   Carbapenem resistance IMP NOT DETECTED NOT DETECTED  Final   Carbapenem resistance KPC NOT DETECTED NOT DETECTED Final   Carbapenem resistance NDM NOT DETECTED NOT DETECTED Final   Carbapenem resist OXA 48 LIKE NOT DETECTED  NOT DETECTED Final   Carbapenem resistance VIM NOT DETECTED NOT DETECTED Final    Comment: Performed at [facility], [place] [address] [place], [place] [address]  Culture, blood (routine x 2)     Status: Abnormal   Collection Time: 09/19/21  1:20 PM   Specimen: BLOOD  Result Value Ref Range Status   Specimen Description   Final    BLOOD BLOOD RIGHT WRIST Performed at [place] [address], [place], [place] [address]    Special Requests   Final    BOTTLES DRAWN AEROBIC AND ANAEROBIC Blood Culture adequate volume Performed at [place] [address] [place], [place] [address]    Culture  Setup Time   Final    GRAM NEGATIVE RODS ANAEROBIC BOTTLE ONLY CRITICAL VALUE NOTED.  VALUE IS CONSISTENT WITH PREVIOUSLY REPORTED AND CALLED VALUE. IN BOTH AEROBIC AND ANAEROBIC BOTTLES    Culture (A)  Final    ESCHERICHIA COLI SUSCEPTIBILITIES PERFORMED ON PREVIOUS CULTURE WITHIN THE LAST 5 DAYS. Performed at [facility], [place] [address] [place], [place] [address]    Report Status 09/22/2021 FINAL  Final  MRSA Next Gen by PCR, Nasal     Status: None   Collection Time: 09/19/21  9:55 PM   Specimen: Nasal Mucosa; Nasal Swab  Result Value Ref Range Status   MRSA by PCR Next Gen NOT DETECTED NOT DETECTED Final    Comment: (NOTE) The GeneXpert MRSA Assay (FDA approved for NASAL specimens only), is one component of a comprehensive MRSA colonization surveillance program. It is not intended to diagnose MRSA infection nor to guide or monitor treatment for MRSA infections. Test performance is not FDA approved in patients less than 49 years old. Performed at [facility], [place] [address], [place], [place] [address]       Radiology Studies: No  results found.  Scheduled Meds:  ARIPiprazole  10 mg Oral Daily   aspirin  81 mg Oral Daily   Chlorhexidine Gluconate Cloth  6 each Topical Q0600   DULoxetine  60 mg Oral BID   enoxaparin (LOVENOX) injection  40 mg Subcutaneous Q24H   gabapentin  600 mg Oral BID   levothyroxine  25 mcg Oral QAC breakfast   pravastatin  40 mg Oral Daily   Continuous Infusions:     LOS: 7 days   Desma Maxim, MD [facility] Pager On Amion  If 7PM-7AM, please contact night-coverage 09/26/2021, 3:41 PM

## 2021-09-27 DIAGNOSIS — G9341 Metabolic encephalopathy: Secondary | ICD-10-CM | POA: Diagnosis not present

## 2021-09-27 DIAGNOSIS — B9620 Unspecified Escherichia coli [E. coli] as the cause of diseases classified elsewhere: Secondary | ICD-10-CM

## 2021-09-27 DIAGNOSIS — D72819 Decreased white blood cell count, unspecified: Secondary | ICD-10-CM

## 2021-09-27 DIAGNOSIS — F419 Anxiety disorder, unspecified: Secondary | ICD-10-CM | POA: Diagnosis not present

## 2021-09-27 DIAGNOSIS — A419 Sepsis, unspecified organism: Secondary | ICD-10-CM | POA: Diagnosis not present

## 2021-09-27 DIAGNOSIS — R7881 Bacteremia: Secondary | ICD-10-CM

## 2021-09-27 DIAGNOSIS — F32A Depression, unspecified: Secondary | ICD-10-CM | POA: Diagnosis not present

## 2021-09-27 DIAGNOSIS — E039 Hypothyroidism, unspecified: Secondary | ICD-10-CM

## 2021-09-27 DIAGNOSIS — N390 Urinary tract infection, site not specified: Secondary | ICD-10-CM

## 2021-09-27 NOTE — Progress Notes (Signed)
PROGRESS NOTE    [name]  KYH:[id number] DOB: [birth year] DOA: 09/19/2021 PCP: [name], MD   Chief Complaint  Patient presents with   Altered Mental Status    Brief Narrative:  75 y.o. female with medical history significant of anal cancer, anxiety, osteoarthritis, depression, hyperlipidemia, history of rectal bleeding, history of Staphylococcus bacteremia with sepsis who is coming to the emergency department due to altered mental status.  She is normally fully oriented. Pt was found to be floridly septic with UTI and later ecoli bacteremia     Assessment & Plan:   Principal Problem:   Sepsis due to undetermined organism [facility]) Active Problems:   Hyperlipidemia   Depression   Anxiety   Acute metabolic encephalopathy   Hypothyroid   Hypokalemia   Leukopenia   Prolonged QT interval   E coli bacteremia   Acute lower UTI  #1 severe sepsis secondary to E. coli bacteremia and UTI, POA -Patient presented with criteria for sepsis with leukocytosis, lactic acidosis, tachycardia, tachypnea. -Patient pancultured with blood cultures positive for E. coli. -Urinalysis consistent with UTI, however urine cultures not obtained. -Patient was on IV Rocephin, IV vancomycin, IV Flagyl and subsequently antibiotics narrowed down to Bactrim and completed 7-day course of antibiotic treatment. -Supportive care.  2.  Acute metabolic encephalopathy -Secondary to problem #1. -Clinical improvement. -Likely close to baseline.  3.  Leukopenia -Resolved.  4.  Depression/anxiety -Continue Cymbalta, trazodone.  5.  Hypothyroidism -Continue Synthroid. -Outpatient follow-up with PCP.  6.  Lactic acidosis -Secondary to problem #1 and dehydration. -Resolved.  7.  Debility -Seen by PT/OT who are recommending SNF placement. -TOC consulted.  8.  QTc prolongation -Resolved.   DVT prophylaxis: Lovenox Code Status: Full Family Communication: Updated patient.  No family at  bedside. Disposition:   Status is: Inpatient  Remains inpatient appropriate because: Unsafe discharge       Consultants:  Wound care RN 09/25/2021  Procedures: Chest x-ray 09/19/2021  Antimicrobials:  Bactrim 09/23/2021>>>> 09/26/2021 IV cefepime 09/19/2021>>>> 09/20/2021 IV Rocephin 09/20/2021>>>> 09/23/2021 IV Flagyl 09/19/2021>>>> 09/21/2021 IV vancomycin 09/19/2021>>>>> 09/21/2021   Subjective: Patient laying in bed.  No chest pain.  No shortness of breath.  No abdominal pain.  Overall feeling much better than on admission.  Awaiting to go to SNF.  Objective: Vitals:   09/26/21 1401 09/26/21 2049 09/27/21 0620 09/27/21 1628  BP: (!) 118/52 126/63 118/71 126/60  Pulse: 70 73 76 73  Resp: 20 16 20 18   Temp: 98.5 F (36.9 C) 98 F (36.7 C) 97.7 F (36.5 C) 98 F (36.7 C)  TempSrc: Oral Oral Oral   SpO2: 100% 98% 100% 99%  Weight:      Height:        Intake/Output Summary (Last 24 hours) at 09/27/2021 1810 Last data filed at 09/27/2021 1210 Gross per 24 hour  Intake --  Output 600 ml  Net -600 ml   Filed Weights   09/19/21 1107  Weight: 72.6 kg    Examination:  General exam: Appears calm and comfortable  Respiratory system: Clear to auscultation. Respiratory effort normal. Cardiovascular system: S1 & S2 heard, RRR. No JVD, murmurs, rubs, gallops or clicks. No pedal edema. Gastrointestinal system: Abdomen is nondistended, soft and nontender. No organomegaly or masses felt. Normal bowel sounds heard. Central nervous system: Alert and oriented. No focal neurological deficits. Extremities: Symmetric 5 x 5 power. Skin: No rashes, lesions or ulcers Psychiatry: Judgement and insight appear normal. Mood & affect appropriate.  Data Reviewed: I have personally reviewed following labs and imaging studies  CBC: Recent Labs  Lab 09/21/21 0309 09/22/21 0444 09/23/21 0500 09/24/21 0516  WBC 14.5* 10.2 5.7 5.2  HGB 10.3* 9.7* 9.4* 10.0*  HCT 32.4*  30.6* 29.8* 30.4*  MCV 98.2 97.8 97.7 95.9  PLT 110* 136* 147* 694    Basic Metabolic Panel: Recent Labs  Lab 09/22/21 0444 09/23/21 0500 09/24/21 0516 09/25/21 0458 09/26/21 0531  NA 144 143 141 139 136  K 3.2* 4.2 3.6 3.6 3.9  CL 111 109 108 107 103  CO2 27 27 27 25 24   GLUCOSE 128* 89 95 103* 97  BUN 20 18 15 11 13   CREATININE 0.56 0.47 0.71 0.65 0.77  CALCIUM 8.3* 8.4* 8.3* 8.3* 8.6*    GFR: Estimated Creatinine Clearance: 57.6 mL/min (by C-G formula based on SCr of 0.77 mg/dL).  Liver Function Tests: Recent Labs  Lab 09/21/21 0309 09/22/21 0444 09/23/21 0500  AST 91* 41 39  ALT 62* 47* 35  ALKPHOS 76 80 74  BILITOT 0.8 0.6 1.2  PROT 5.9* 5.9* 5.6*  ALBUMIN 2.8* 2.8* 2.6*    CBG: Recent Labs  Lab 09/22/21 1606 09/22/21 2229 09/23/21 0712 09/23/21 1128 09/23/21 1635  GLUCAP 82 93 75 109* 76     Recent Results (from the past 240 hour(s))  Culture, blood (routine x 2)     Status: Abnormal   Collection Time: 09/19/21 11:32 AM   Specimen: BLOOD  Result Value Ref Range Status   Specimen Description   Final    BLOOD LEFT ANTECUBITAL Performed at [facility] [place] [address], [place], [place] [address]    Special Requests   Final    BOTTLES DRAWN AEROBIC AND ANAEROBIC Blood Culture results may not be optimal due to an excessive volume of blood received in culture bottles Performed at [facility] [address] [place], [place] [address]    Culture  Setup Time   Final    GRAM NEGATIVE RODS ANAEROBIC BOTTLE ONLY CRITICAL RESULT CALLED TO, READ BACK BY AND VERIFIED WITH: PHARMD ELLEN JACKSON 09/20/21@1 :322 BY TW IN BOTH AEROBIC AND ANAEROBIC BOTTLES Performed at [facility], [place] [address], [place], [place] [address]    Culture ESCHERICHIA COLI (A)  Final   Report Status 09/22/2021 FINAL  Final   Organism ID, Bacteria ESCHERICHIA COLI  Final      Susceptibility   Escherichia coli - MIC*     AMPICILLIN >=32 RESISTANT Resistant     CEFAZOLIN 16 SENSITIVE Sensitive     CEFEPIME <=0.12 SENSITIVE Sensitive     CEFTAZIDIME <=1 SENSITIVE Sensitive     CEFTRIAXONE <=0.25 SENSITIVE Sensitive     CIPROFLOXACIN <=0.25 SENSITIVE Sensitive     GENTAMICIN <=1 SENSITIVE Sensitive     IMIPENEM <=0.25 SENSITIVE Sensitive     TRIMETH/SULFA <=20 SENSITIVE Sensitive     AMPICILLIN/SULBACTAM 16 INTERMEDIATE Intermediate     PIP/TAZO <=4 SENSITIVE Sensitive     * ESCHERICHIA COLI  Resp Panel by RT-PCR (Flu A&B, Covid) Nasopharyngeal Swab     Status: None   Collection Time: 09/19/21 11:32 AM   Specimen: Nasopharyngeal Swab; Nasopharyngeal(NP) swabs in vial transport medium  Result Value Ref Range Status   SARS Coronavirus 2 by RT PCR NEGATIVE NEGATIVE Final    Comment: (NOTE) SARS-CoV-2 target nucleic acids are NOT DETECTED.  The SARS-CoV-2 RNA is generally detectable in upper respiratory specimens during the acute phase of infection. The  lowest concentration of SARS-CoV-2 viral copies this assay can detect is 138 copies/mL. A negative result does not preclude SARS-Cov-2 infection and should not be used as the Tiffany basis for treatment or other patient management decisions. A negative result may occur with  improper specimen collection/handling, submission of specimen other than nasopharyngeal swab, presence of viral mutation(s) within the areas targeted by this assay, and inadequate number of viral copies(<138 copies/mL). A negative result must be combined with clinical observations, patient history, and epidemiological information. The expected result is Negative.  Fact Sheet for Patients:  EntrepreneurPulse.com.au  Fact Sheet for Healthcare Providers:  IncredibleEmployment.be  This test is no t yet approved or cleared by the [place] FDA and  has been authorized for detection and/or diagnosis of SARS-CoV-2 by FDA under an Emergency Use  Authorization (EUA). This EUA will remain  in effect (meaning this test can be used) for the duration of the COVID-19 declaration under Section 564(b)(1) of the Act, 21 U.S.C.section 360bbb-3(b)(1), unless the authorization is terminated  or revoked sooner.       Influenza A by PCR NEGATIVE NEGATIVE Final   Influenza B by PCR NEGATIVE NEGATIVE Final    Comment: (NOTE) The Xpert Xpress SARS-CoV-2/FLU/RSV plus assay is intended as an aid in the diagnosis of influenza from Nasopharyngeal swab specimens and should not be used as a Tiffany basis for treatment. Nasal washings and aspirates are unacceptable for Xpert Xpress SARS-CoV-2/FLU/RSV testing.  Fact Sheet for Patients: EntrepreneurPulse.com.au  Fact Sheet for Healthcare Providers: IncredibleEmployment.be  This test is not yet approved or cleared by the [place] FDA and has been authorized for detection and/or diagnosis of SARS-CoV-2 by FDA under an Emergency Use Authorization (EUA). This EUA will remain in effect (meaning this test can be used) for the duration of the COVID-19 declaration under Section 564(b)(1) of the Act, 21 U.S.C. section 360bbb-3(b)(1), unless the authorization is terminated or revoked.  Performed at [facility], [place] [address] [place], [place] [address]   Blood Culture ID Panel (Reflexed)     Status: Abnormal   Collection Time: 09/19/21 11:32 AM  Result Value Ref Range Status   Enterococcus faecalis NOT DETECTED NOT DETECTED Final   Enterococcus Faecium NOT DETECTED NOT DETECTED Final   Listeria monocytogenes NOT DETECTED NOT DETECTED Final   Staphylococcus species NOT DETECTED NOT DETECTED Final   Staphylococcus aureus (BCID) NOT DETECTED NOT DETECTED Final   Staphylococcus epidermidis NOT DETECTED NOT DETECTED Final   Staphylococcus lugdunensis NOT DETECTED NOT DETECTED Final   Streptococcus species NOT DETECTED NOT DETECTED Final    Streptococcus agalactiae NOT DETECTED NOT DETECTED Final   Streptococcus pneumoniae NOT DETECTED NOT DETECTED Final   Streptococcus pyogenes NOT DETECTED NOT DETECTED Final   A.calcoaceticus-baumannii NOT DETECTED NOT DETECTED Final   Bacteroides fragilis NOT DETECTED NOT DETECTED Final   Enterobacterales DETECTED (A) NOT DETECTED Final    Comment: Enterobacterales represent a large order of gram negative bacteria, not a single organism. CRITICAL RESULT CALLED TO, READ BACK BY AND VERIFIED WITH: PHARMD ELLEN JACKSON 09/20/21@1 :322 BY TW    Enterobacter cloacae complex NOT DETECTED NOT DETECTED Final   Escherichia coli DETECTED (A) NOT DETECTED Final    Comment: CRITICAL RESULT CALLED TO, READ BACK BY AND VERIFIED WITH: PHARMD ELLEN JACKSON 09/20/21@1 :322 BY TW    Klebsiella aerogenes NOT DETECTED NOT DETECTED Final   Klebsiella oxytoca NOT DETECTED NOT DETECTED Final   Klebsiella pneumoniae NOT DETECTED NOT DETECTED Final   Proteus species  NOT DETECTED NOT DETECTED Final   Salmonella species NOT DETECTED NOT DETECTED Final   Serratia marcescens NOT DETECTED NOT DETECTED Final   Haemophilus influenzae NOT DETECTED NOT DETECTED Final   Neisseria meningitidis NOT DETECTED NOT DETECTED Final   Pseudomonas aeruginosa NOT DETECTED NOT DETECTED Final   Stenotrophomonas maltophilia NOT DETECTED NOT DETECTED Final   Candida albicans NOT DETECTED NOT DETECTED Final   Candida auris NOT DETECTED NOT DETECTED Final   Candida glabrata NOT DETECTED NOT DETECTED Final   Candida krusei NOT DETECTED NOT DETECTED Final   Candida parapsilosis NOT DETECTED NOT DETECTED Final   Candida tropicalis NOT DETECTED NOT DETECTED Final   Cryptococcus neoformans/gattii NOT DETECTED NOT DETECTED Final   CTX-M ESBL NOT DETECTED NOT DETECTED Final   Carbapenem resistance IMP NOT DETECTED NOT DETECTED Final   Carbapenem resistance KPC NOT DETECTED NOT DETECTED Final   Carbapenem resistance NDM NOT DETECTED NOT  DETECTED Final   Carbapenem resist OXA 48 LIKE NOT DETECTED NOT DETECTED Final   Carbapenem resistance VIM NOT DETECTED NOT DETECTED Final    Comment: Performed at [facility], [address] [place], [place] [address]  Culture, blood (routine x 2)     Status: Abnormal   Collection Time: 09/19/21  1:20 PM   Specimen: BLOOD  Result Value Ref Range Status   Specimen Description   Final    BLOOD BLOOD RIGHT WRIST Performed at [facility] [address], [place], [place] [address]    Special Requests   Final    BOTTLES DRAWN AEROBIC AND ANAEROBIC Blood Culture adequate volume Performed at [facility] [address] [place], [place] [address]    Culture  Setup Time   Final    GRAM NEGATIVE RODS ANAEROBIC BOTTLE ONLY CRITICAL VALUE NOTED.  VALUE IS CONSISTENT WITH PREVIOUSLY REPORTED AND CALLED VALUE. IN BOTH AEROBIC AND ANAEROBIC BOTTLES    Culture (A)  Final    ESCHERICHIA COLI SUSCEPTIBILITIES PERFORMED ON PREVIOUS CULTURE WITHIN THE LAST 5 DAYS. Performed at [facility], [place] [address] [place], [place] [address]    Report Status 09/22/2021 FINAL  Final  MRSA Next Gen by PCR, Nasal     Status: None   Collection Time: 09/19/21  9:55 PM   Specimen: Nasal Mucosa; Nasal Swab  Result Value Ref Range Status   MRSA by PCR Next Gen NOT DETECTED NOT DETECTED Final    Comment: (NOTE) The GeneXpert MRSA Assay (FDA approved for NASAL specimens only), is one component of a comprehensive MRSA colonization surveillance program. It is not intended to diagnose MRSA infection nor to guide or monitor treatment for MRSA infections. Test performance is not FDA approved in patients less than 86 years old. Performed at [facility], [place] [address] [place], [place] [address]          Radiology Studies: No results found.      Scheduled Meds:  ARIPiprazole  10 mg Oral Daily   aspirin  81 mg Oral Daily    Chlorhexidine Gluconate Cloth  6 each Topical Q0600   DULoxetine  60 mg Oral BID   enoxaparin (LOVENOX) injection  40 mg Subcutaneous Q24H   gabapentin  600 mg Oral BID   levothyroxine  25 mcg Oral QAC breakfast   pravastatin  40 mg Oral Daily   Continuous Infusions:   LOS: 8 days    Time spent: 35 minutes    Irine Seal, MD [facility]   To contact the attending provider between 7A-7P or the covering provider during after hours 7P-7A, please log into the web site [email or web address] and access using universal [place] password for that web site. If you do not have the password, please call the hospital operator.  09/27/2021, 6:10 PM

## 2021-09-27 NOTE — Progress Notes (Signed)
[name] - 75 y.o. female MRN [id number]  Date of birth: Sep 06, 1946  Office Visit Note: Visit Date: 09/10/2021 PCP: [name], MD Referred by: [name], MD  Subjective: Chief Complaint  Patient presents with   Lower Back - Pain   HPI:  [name] is a 75 y.o. female who comes in today at the request of Benjiman Core, PA-C for planned Right L3-4 Lumbar Transforaminal epidural steroid injection with fluoroscopic guidance.  The patient has failed conservative care including home exercise, medications, time and activity modification.  This injection will be diagnostic and hopefully therapeutic.  Please see requesting physician notes for further details and justification.  ROS Otherwise per HPI.  Assessment & Plan: Visit Diagnoses:    ICD-10-CM   1. Lumbar radiculopathy  M54.16 XR C-ARM NO REPORT    Epidural Steroid injection    methylPREDNISolone acetate (DEPO-MEDROL) injection 80 mg    2. Spinal stenosis of lumbar region with neurogenic claudication  M48.062 XR C-ARM NO REPORT    Epidural Steroid injection    methylPREDNISolone acetate (DEPO-MEDROL) injection 80 mg      Plan: No additional findings.   Meds & Orders:  Meds ordered this encounter  Medications   methylPREDNISolone acetate (DEPO-MEDROL) injection 80 mg    Orders Placed This Encounter  Procedures   XR C-ARM NO REPORT   Epidural Steroid injection    Follow-up: Return if symptoms worsen or fail to improve.   Procedures: No procedures performed  Lumbosacral Transforaminal Epidural Steroid Injection - Sub-Pedicular Approach with Fluoroscopic Guidance  [name]      Date of Birth: September 20, 1946 MRN: [id number] PCP: [name], MD      Visit Date: 09/10/2021   Universal Protocol:    Date/Time: 09/10/2021  Consent Given By: the patient  Position: PRONE  Additional Comments: Vital signs were monitored before and after the procedure. Patient was prepped and draped in  the usual sterile fashion. The correct patient, procedure, and site was verified.   Injection Procedure Details:   Procedure diagnoses: Lumbar radiculopathy [M54.16]    Meds Administered:  Meds ordered this encounter  Medications   methylPREDNISolone acetate (DEPO-MEDROL) injection 80 mg    Laterality: Right  Location/Site: L3  Needle:5.0 in., 22 ga.  Short bevel or Quincke spinal needle  Needle Placement: Transforaminal  Findings:    -Comments: Excellent flow of contrast along the nerve, nerve root and into the epidural space.  Procedure Details: After squaring off the end-plates to get a true AP view, the C-arm was positioned so that an oblique view of the foramen as noted above was visualized. The target area is just inferior to the "nose of the scotty dog" or sub pedicular. The soft tissues overlying this structure were infiltrated with 2-3 ml. of 1% Lidocaine without Epinephrine.  The spinal needle was inserted toward the target using a "trajectory" view along the fluoroscope beam.  Under AP and lateral visualization, the needle was advanced so it did not puncture dura and was located close the 6 O'Clock position of the pedical in AP tracterory. Biplanar projections were used to confirm position. Aspiration was confirmed to be negative for CSF and/or blood. A 1-2 ml. volume of Isovue-250 was injected and flow of contrast was noted at each level. Radiographs were obtained for documentation purposes.   After attaining the desired flow of contrast documented above, a 0.5 to 1.0 ml test dose of 0.25% Marcaine was injected into each respective transforaminal space.  The patient was observed for 90 seconds post injection.  After no sensory deficits were reported, and normal lower extremity motor function was noted,   the above injectate was administered so that equal amounts of the injectate were placed at each foramen (level) into the transforaminal epidural space.   Additional  Comments:  No complications occurred Dressing: 2 x 2 sterile gauze and Band-Aid    Post-procedure details: Patient was observed during the procedure. Post-procedure instructions were reviewed.  Patient left the clinic in stable condition.   Clinical History: [facility]  [place], [place], [place] [address] Tel: [phone] Fax:  [phone] Test Date:  01/09/2020   [name] DOB: 04/11/1946 Physician: Narda Amber, DO Sex: Female Height: 5\' 5"  Ref Phys: Narda Amber, DO ID#: [id number] Temp: 32.0C Technician:     Patient Complaints: This is a 75 year old female referred for evaluation of bilateral feet and leg numbness and tingling.   NCV & EMG Findings: Extensive electrodiagnostic testing of the right lower extremity and additional studies of the left shows:  1. Bilateral sural and superficial peroneal sensory responses are within normal limits. 2. Bilateral peroneal motor responses are within normal limits.  Bilateral tibial motor responses show reduced amplitude (R2.9, L2.5 mV).   3. Bilateral H reflex studies are absent.   4. Chronic motor axonal loss changes are seen affecting the S1 myotome bilaterally, without accompanied active denervation.     Impression: 1. Chronic S1 radiculopathy affecting bilateral lower extremities, moderate. 2. There is no evidence of a large fiber sensorimotor polyneuropathy affecting the lower extremities.    --- EXAM: MRI LUMBAR SPINE WITHOUT CONTRAST   TECHNIQUE: Multiplanar, multisequence MR imaging of the lumbar spine was performed. No intravenous contrast was administered.   COMPARISON:  09/07/2019   FINDINGS: Segmentation: Standard.   Alignment: Unchanged trace anterolisthesis of L4 on L5. 5 mm anterolisthesis of L5 on S1, mildly increased and facet mediated.   Vertebrae: No acute fracture, suspicious marrow lesion, or significant marrow edema. Unchanged chronic L1, L3, and  L5 compression fractures. Unchanged T11 superior endplate Schmorl's node. Scattered small vertebral hemangiomas.   Conus medullaris and cauda equina: Conus extends to the L1 level. Conus and cauda equina appear normal.   Paraspinal and other soft tissues: Unchanged subcentimeter T2 hyperintensity in the lower pole of the right kidney, likely a cyst.   Disc levels:   Disc desiccation throughout the lumbar spine. Mild disc space narrowing at L3-4 and L5-S1.   T12-L1: Mild disc bulging and mild facet arthrosis without stenosis, unchanged.   L1-2: Stable to slightly increased disc bulging. Mild facet hypertrophy. No significant stenosis.   L2-3: Disc bulging, endplate spurring, and moderate facet hypertrophy result in mild spinal stenosis and mild bilateral neural foraminal stenosis, unchanged.   L3-4: Disc bulging and severe facet and ligamentum flavum hypertrophy have progressed, and there is a new right foraminal disc protrusion. These findings result in moderate spinal stenosis and severe right and moderate left neural foraminal stenosis. Likely right L3 nerve root impingement.   L4-5: Disc bulging and severe facet hypertrophy result in mild bilateral lateral recess stenosis, borderline spinal stenosis, and borderline bilateral neural foraminal stenosis, unchanged. Bilateral facet ankylosis.   L5-S1: Anterolisthesis with bulging uncovered disc, a central disc extrusion, and severe facet hypertrophy result in moderate spinal stenosis, moderate bilateral lateral recess stenosis, and moderate bilateral neural foraminal stenosis, progressed from prior.   IMPRESSION: 1. Progressive disc and facet degeneration at L3-4 with  a new right foraminal disc protrusion. Severe right neural foraminal stenosis and moderate spinal stenosis. 2. Progressive, moderate spinal stenosis and moderate bilateral neural foraminal stenosis at L5-S1.     Electronically Signed   By: Logan Bores  M.D.   On: 07/13/2021 16:54     Objective:  VS:  HT:    WT:   BMI:     BP:139/82  HR:85bpm  TEMP: ( )  RESP:  Physical Exam Vitals and nursing note reviewed.  Constitutional:      General: She is not in acute distress.    Appearance: Normal appearance. She is not ill-appearing.  HENT:     Head: Normocephalic and atraumatic.     Right Ear: External ear normal.     Left Ear: External ear normal.  Eyes:     Extraocular Movements: Extraocular movements intact.  Cardiovascular:     Rate and Rhythm: Normal rate.     Pulses: Normal pulses.  Pulmonary:     Effort: Pulmonary effort is normal. No respiratory distress.  Abdominal:     General: There is no distension.     Palpations: Abdomen is soft.  Musculoskeletal:        General: Tenderness present.     Cervical back: Neck supple.     Right lower leg: No edema.     Left lower leg: No edema.     Comments: Patient has good distal strength with no pain over the greater trochanters.  No clonus or focal weakness.  Skin:    Findings: No erythema, lesion or rash.  Neurological:     General: No focal deficit present.     Mental Status: She is alert and oriented to person, place, and time.     Sensory: No sensory deficit.     Motor: No weakness or abnormal muscle tone.     Coordination: Coordination normal.  Psychiatric:        Mood and Affect: Mood normal.        Behavior: Behavior normal.     Imaging: No results found.

## 2021-09-27 NOTE — Procedures (Signed)
Lumbosacral Transforaminal Epidural Steroid Injection - Sub-Pedicular Approach with Fluoroscopic Guidance  [name]      Date of Birth: [birth year] MRN: [id number] PCP: [name], MD      Visit Date: 09/10/2021   Universal Protocol:    Date/Time: 09/10/2021  Consent Given By: the patient  Position: PRONE  Additional Comments: Vital signs were monitored before and after the procedure. Patient was prepped and draped in the usual sterile fashion. The correct patient, procedure, and site was verified.   Injection Procedure Details:   Procedure diagnoses: Lumbar radiculopathy [M54.16]    Meds Administered:  Meds ordered this encounter  Medications   methylPREDNISolone acetate (DEPO-MEDROL) injection 80 mg    Laterality: Right  Location/Site: L3  Needle:5.0 in., 22 ga.  Short bevel or Quincke spinal needle  Needle Placement: Transforaminal  Findings:    -Comments: Excellent flow of contrast along the nerve, nerve root and into the epidural space.  Procedure Details: After squaring off the end-plates to get a true AP view, the C-arm was positioned so that an oblique view of the foramen as noted above was visualized. The target area is just inferior to the "nose of the scotty dog" or sub pedicular. The soft tissues overlying this structure were infiltrated with 2-3 ml. of 1% Lidocaine without Epinephrine.  The spinal needle was inserted toward the target using a "trajectory" view along the fluoroscope beam.  Under AP and lateral visualization, the needle was advanced so it did not puncture dura and was located close the 6 O'Clock position of the pedical in AP tracterory. Biplanar projections were used to confirm position. Aspiration was confirmed to be negative for CSF and/or blood. A 1-2 ml. volume of Isovue-250 was injected and flow of contrast was noted at each level. Radiographs were obtained for documentation purposes.   After attaining the desired flow  of contrast documented above, a 0.5 to 1.0 ml test dose of 0.25% Marcaine was injected into each respective transforaminal space.  The patient was observed for 90 seconds post injection.  After no sensory deficits were reported, and normal lower extremity motor function was noted,   the above injectate was administered so that equal amounts of the injectate were placed at each foramen (level) into the transforaminal epidural space.   Additional Comments:  No complications occurred Dressing: 2 x 2 sterile gauze and Band-Aid    Post-procedure details: Patient was observed during the procedure. Post-procedure instructions were reviewed.  Patient left the clinic in stable condition.

## 2021-09-28 ENCOUNTER — Ambulatory Visit: Payer: Medicare HMO | Admitting: Orthopedic Surgery

## 2021-09-28 ENCOUNTER — Telehealth: Payer: Self-pay | Admitting: Physical Therapy

## 2021-09-28 ENCOUNTER — Encounter: Payer: Medicare HMO | Admitting: Physical Therapy

## 2021-09-28 DIAGNOSIS — F419 Anxiety disorder, unspecified: Secondary | ICD-10-CM | POA: Diagnosis not present

## 2021-09-28 DIAGNOSIS — F32A Depression, unspecified: Secondary | ICD-10-CM | POA: Diagnosis not present

## 2021-09-28 DIAGNOSIS — G9341 Metabolic encephalopathy: Secondary | ICD-10-CM | POA: Diagnosis not present

## 2021-09-28 DIAGNOSIS — A419 Sepsis, unspecified organism: Secondary | ICD-10-CM | POA: Diagnosis not present

## 2021-09-28 LAB — BASIC METABOLIC PANEL
Anion gap: 8 (ref 5–15)
BUN: 17 mg/dL (ref 8–23)
CO2: 25 mmol/L (ref 22–32)
Calcium: 9 mg/dL (ref 8.9–10.3)
Chloride: 102 mmol/L (ref 98–111)
Creatinine, Ser: 0.85 mg/dL (ref 0.44–1.00)
GFR, Estimated: >60 mL/min (ref >60)
Glucose, Bld: 144 mg/dL — ABNORMAL HIGH (ref 70–99)
Potassium: 3.9 mmol/L (ref 3.5–5.1)
Sodium: 135 mmol/L (ref 135–145)

## 2021-09-28 LAB — RESP PANEL BY RT-PCR (FLU A&B, COVID) ARPGX2
Influenza A by PCR: NEGATIVE
Influenza B by PCR: NEGATIVE
SARS Coronavirus 2 by RT PCR: NEGATIVE

## 2021-09-28 NOTE — Care Management Important Message (Signed)
Important Message  Patient Details IM Letter given to the Patient. Name: [name]: [id number] Date of Birth: 1946-07-25   Medicare Important Message Given:  Yes     Kerin Salen 09/28/2021, 11:12 AM

## 2021-09-28 NOTE — Progress Notes (Signed)
Physical Therapy Treatment Patient Details Name: [name]: [id number] DOB: 07-06-1946 Today's Date: 09/28/2021   History of Present Illness Pt is 75 yo female who presented to ED with altered mental status. PMH significant of anal cancer, anxiety, osteoarthritis, depression, hyperlipidemia, history of rectal bleeding, history of Staphylococcus bacteremia with sepsis    PT Comments     Patient is making excellent progress with mobility and ambulated ~120' feet with RW for support. Pt demonstrated safe management of walker and required 2 standing rest breaks throughout. EOS pt initiated functional LE strengthening and seated exercises for Rt knee ROM deficits. Acute PT will continue to progress pt as able, recommend ST rehab at [facility] for safe discharge.    Recommendations for follow up therapy are one component of a multi-disciplinary discharge planning process, led by the attending physician.  Recommendations may be updated based on patient status, additional functional criteria and insurance authorization.  Follow Up Recommendations  Skilled nursing-short term rehab (<3 hours/day)     Assistance Recommended at Discharge Frequent or constant Supervision/Assistance  Equipment Recommendations  None recommended by PT    Recommendations for Other Services       Precautions / Restrictions Precautions Precautions: Fall Precaution Comments: TKR 8/22 was still going to OP Restrictions Weight Bearing Restrictions: No     Mobility  Bed Mobility Overal bed mobility: Needs Assistance Bed Mobility: Supine to Sit     Supine to sit: Supervision;HOB elevated;Modified independent (Device/Increase time)     General bed mobility comments: pt taking extra time and HOB elevated. pt using bed rails    Transfers Overall transfer level: Needs assistance Equipment used: 2 person hand held assist Transfers: Sit to/from Stand Sit to Stand: Min assist;Min guard           General  transfer comment: min assist to rise from EOB and to steady in standing. pt completed repeated sit<>stands from chair/reclienr height. Min-mod assist to rise without UE use and Min guard for safety with bil UE use for power up.    Ambulation/Gait Ambulation/Gait assistance: Min assist;Mod assist;Max assist Gait Distance (Feet): 120 Feet Assistive device: Rolling walker (2 wheels);None Gait Pattern/deviations: Step-through pattern;Decreased step length - right;Decreased step length - left Gait velocity: decr         Stairs             Wheelchair Mobility    Modified Rankin (Stroke Patients Only)       Balance Overall balance assessment: Needs assistance;History of Falls Sitting-balance support: Bilateral upper extremity supported;Feet supported Sitting balance-Leahy Scale: Fair     Standing balance support: Bilateral upper extremity supported;During functional activity;Reliant on assistive device for balance Standing balance-Leahy Scale: Poor                              Cognition Arousal/Alertness: Awake/alert Behavior During Therapy: WFL for tasks assessed/performed Overall Cognitive Status: Within Functional Limits for tasks assessed                                          Exercises      General Comments        Pertinent Vitals/Pain Pain Assessment: No/denies pain Pain Intervention(s): Monitored during session;Limited activity within patient's tolerance    Home Living  Prior Function            PT Goals (current goals can now be found in the care plan section) Acute Rehab PT Goals Patient Stated Goal: get home to her puppy PT Goal Formulation: Patient unable to participate in goal setting Time For Goal Achievement: 10/07/21 Potential to Achieve Goals: Fair Progress towards PT goals: Progressing toward goals    Frequency    Min 2X/week      PT Plan Current plan remains  appropriate    Co-evaluation              AM-PAC PT "6 Clicks" Mobility   Outcome Measure  Help needed turning from your back to your side while in a flat bed without using bedrails?: A Little Help needed moving from lying on your back to sitting on the side of a flat bed without using bedrails?: A Little Help needed moving to and from a bed to a chair (including a wheelchair)?: A Little Help needed standing up from a chair using your arms (e.g., wheelchair or bedside chair)?: A Little Help needed to walk in hospital room?: A Little Help needed climbing 3-5 steps with a railing? : A Lot 6 Click Score: 17    End of Session Equipment Utilized During Treatment: Gait belt Activity Tolerance: Patient limited by fatigue Patient left: in chair;with call bell/phone within reach;with chair alarm set Nurse Communication: Mobility status PT Visit Diagnosis: Difficulty in walking, not elsewhere classified (R26.2)     Time: 1610-9604 PT Time Calculation (min) (ACUTE ONLY): 23 min  Charges:  $Gait Training: 8-22 mins $Therapeutic Exercise: 8-22 mins                     Verner Mould, DPT Acute Rehabilitation Services Office [phone] Pager [phone]    Jacques Navy 09/28/2021, 11:02 AM

## 2021-09-28 NOTE — Telephone Encounter (Signed)
I called pt to follow up after her missed therapy visit today at 2:30 pm. Her voice mail box was full and I was unable to leave a message.  Pt has no further PT appointment made.  Kearney Hard, PT MPT 09/28/21 4:30 PM

## 2021-09-28 NOTE — TOC Progression Note (Signed)
Transition of Care [facility]) - Progression Note    Patient Details  Name: [name]: [id number] Date of Birth: [birth year]  Transition of Care [facility]) CM/SW [place], [place] Phone Number: 09/28/2021, 10:43 AM  Clinical Narrative:   Patient who is stable for d/c is in need for insurance authorization and PASSR.  Patient is not managed by [facility], so facility is working on [place].  PASSR is asking for note stating reason for use of Abilify.  MD notified. TOC will continue to follow during the course of hospitalization.     Expected Discharge Plan: Skilled Nursing Facility Barriers to Discharge: Other (must enter comment) (Ins auth as initiated by facility)  Expected Discharge Plan and Services Expected Discharge Plan: [place] Choice: [facility] arrangements for the past 2 months: Single Family Home                                       Social Determinants of Health (SDOH) Interventions    Readmission Risk Interventions No flowsheet data found.

## 2021-09-28 NOTE — Progress Notes (Signed)
PROGRESS NOTE    [name]  ZGY:[id number] DOB: [birth year] DOA: 09/19/2021 PCP: [name], MD   Chief Complaint  Patient presents with   Altered Mental Status    Brief Narrative:  75 y.o. female with medical history significant of anal cancer, anxiety, osteoarthritis, depression, hyperlipidemia, history of rectal bleeding, history of Staphylococcus bacteremia with sepsis who is coming to the emergency department due to altered mental status.  She is normally fully oriented. Pt was found to be floridly septic with UTI and later ecoli bacteremia     Assessment & Plan:   Principal Problem:   Sepsis due to undetermined organism [facility]) Active Problems:   Hyperlipidemia   Depression   Anxiety   Acute metabolic encephalopathy   Hypothyroid   Hypokalemia   Leukopenia   Prolonged QT interval   E coli bacteremia   Acute lower UTI  1 severe sepsis secondary to E. coli bacteremia and UTI, POA -Patient presented with criteria for sepsis with leukocytosis, lactic acidosis, tachycardia, tachypnea. -Patient pancultured with blood cultures positive for E. coli. -Urinalysis consistent with UTI, however urine cultures not obtained. -Patient was on IV Rocephin, IV vancomycin, IV Flagyl and subsequently antibiotics narrowed down to Bactrim and completed 7-day course of antibiotic treatment. -Supportive care.  2.  Acute metabolic encephalopathy -Secondary to problem #1. -Improved clinically.   -Close to baseline.   3.  Leukopenia -Resolved.  4.  Depression/anxiety -Stable.   -Continue Cymbalta, trazodone as needed nightly, Abilify which patient has been on for 2 years started in the outpatient setting and likely to help augment patient's Cymbalta.  Patient with no suicidal or homicidal ideation.  Patient with no agitation.  5.  Hypothyroidism -Synthroid.   -Outpatient follow-up with PCP.   6.  Lactic acidosis -Secondary to problem #1 and dehydration.   -Resolved.   7.   Debility -Seen by PT/OT who are recommending SNF placement. -TOC consulted for SNF placement.  8.  QTc prolongation -Resolved.   DVT prophylaxis: Lovenox Code Status: Full Family Communication: Updated patient.  No family at bedside. Disposition:   Status is: Inpatient  Remains inpatient appropriate because: Unsafe discharge       Consultants:  Wound care RN 09/25/2021  Procedures: Chest x-ray 09/19/2021  Antimicrobials:  Bactrim 09/23/2021>>>> 09/26/2021 IV cefepime 09/19/2021>>>> 09/20/2021 IV Rocephin 09/20/2021>>>> 09/23/2021 IV Flagyl 09/19/2021>>>> 09/21/2021 IV vancomycin 09/19/2021>>>>> 09/21/2021   Subjective: Laying in bed.  No chest pain.  No shortness of breath.  Overall feeling better.  Awaiting SNF placement.    Objective: Vitals:   09/27/21 0620 09/27/21 1628 09/27/21 2137 09/28/21 0628  BP: 118/71 126/60 137/72 119/61  Pulse: 76 73 75 80  Resp: 20 18 12 20   Temp: 97.7 F (36.5 C) 98 F (36.7 C) 98.2 F (36.8 C) (!) 97.4 F (36.3 C)  TempSrc: Oral  Oral Oral  SpO2: 100% 99% 97% 100%  Weight:      Height:       No intake or output data in the 24 hours ending 09/28/21 1302  Filed Weights   09/19/21 1107  Weight: 72.6 kg    Examination:  General exam: : NAD Respiratory system: CTA B anterior lung fields.  No wheezes, no rhonchi.  Speaking in full sentences.  Normal respiratory effort. Cardiovascular system: Regular rate and rhythm no murmurs rubs or gallops.  No JVD.  No lower extremity edema.  Gastrointestinal system: Abdomen soft, nontender, nondistended, positive bowel sounds.  No rebound.  No guarding. Central nervous system:  Alert and oriented. No focal neurological deficits. Extremities: Symmetric 5 x 5 power. Skin: No rashes, lesions or ulcers Psychiatry: Judgement and insight appear normal. Mood & affect appropriate.  Data Reviewed: I have personally reviewed following labs and imaging studies  CBC: Recent Labs  Lab  09/22/21 0444 09/23/21 0500 09/24/21 0516  WBC 10.2 5.7 5.2  HGB 9.7* 9.4* 10.0*  HCT 30.6* 29.8* 30.4*  MCV 97.8 97.7 95.9  PLT 136* 147* 165     Basic Metabolic Panel: Recent Labs  Lab 09/23/21 0500 09/24/21 0516 09/25/21 0458 09/26/21 0531 09/28/21 0908  NA 143 141 139 136 135  K 4.2 3.6 3.6 3.9 3.9  CL 109 108 107 103 102  CO2 27 27 25 24 25   GLUCOSE 89 95 103* 97 144*  BUN 18 15 11 13 17   CREATININE 0.47 0.71 0.65 0.77 0.85  CALCIUM 8.4* 8.3* 8.3* 8.6* 9.0     GFR: Estimated Creatinine Clearance: 54.2 mL/min (by C-G formula based on SCr of 0.85 mg/dL).  Liver Function Tests: Recent Labs  Lab 09/22/21 0444 09/23/21 0500  AST 41 39  ALT 47* 35  ALKPHOS 80 74  BILITOT 0.6 1.2  PROT 5.9* 5.6*  ALBUMIN 2.8* 2.6*     CBG: Recent Labs  Lab 09/22/21 1606 09/22/21 2229 09/23/21 0712 09/23/21 1128 09/23/21 1635  GLUCAP 82 93 75 109* 76      Recent Results (from the past 240 hour(s))  Culture, blood (routine x 2)     Status: Abnormal   Collection Time: 09/19/21 11:32 AM   Specimen: BLOOD  Result Value Ref Range Status   Specimen Description   Final    BLOOD LEFT ANTECUBITAL Performed at [facility], [place] [address] [place], [place] [address]    Special Requests   Final    BOTTLES DRAWN AEROBIC AND ANAEROBIC Blood Culture results may not be optimal due to an excessive volume of blood received in culture bottles Performed at [place] [address] [place], [place] [address]    Culture  Setup Time   Final    GRAM NEGATIVE RODS ANAEROBIC BOTTLE ONLY CRITICAL RESULT CALLED TO, READ BACK BY AND VERIFIED WITH: PHARMD ELLEN JACKSON 09/20/21@1 :322 BY TW IN BOTH AEROBIC AND ANAEROBIC BOTTLES Performed at [facility], [place] [address], [place], [place] [address]    Culture ESCHERICHIA COLI (A)  Final   Report Status 09/22/2021 FINAL  Final   Organism ID, Bacteria ESCHERICHIA COLI  Final       Susceptibility   Escherichia coli - MIC*    AMPICILLIN >=32 RESISTANT Resistant     CEFAZOLIN 16 SENSITIVE Sensitive     CEFEPIME <=0.12 SENSITIVE Sensitive     CEFTAZIDIME <=1 SENSITIVE Sensitive     CEFTRIAXONE <=0.25 SENSITIVE Sensitive     CIPROFLOXACIN <=0.25 SENSITIVE Sensitive     GENTAMICIN <=1 SENSITIVE Sensitive     IMIPENEM <=0.25 SENSITIVE Sensitive     TRIMETH/SULFA <=20 SENSITIVE Sensitive     AMPICILLIN/SULBACTAM 16 INTERMEDIATE Intermediate     PIP/TAZO <=4 SENSITIVE Sensitive     * ESCHERICHIA COLI  Resp Panel by RT-PCR (Flu A&B, Covid) Nasopharyngeal Swab     Status: None   Collection Time: 09/19/21 11:32 AM   Specimen: Nasopharyngeal Swab; Nasopharyngeal(NP) swabs in vial transport medium  Result Value Ref Range Status   SARS Coronavirus 2 by RT PCR NEGATIVE NEGATIVE Final    Comment: (NOTE) SARS-CoV-2 target nucleic acids are NOT  DETECTED.  The SARS-CoV-2 RNA is generally detectable in upper respiratory specimens during the acute phase of infection. The lowest concentration of SARS-CoV-2 viral copies this assay can detect is 138 copies/mL. A negative result does not preclude SARS-Cov-2 infection and should not be used as the sole basis for treatment or other patient management decisions. A negative result may occur with  improper specimen collection/handling, submission of specimen other than nasopharyngeal swab, presence of viral mutation(s) within the areas targeted by this assay, and inadequate number of viral copies(<138 copies/mL). A negative result must be combined with clinical observations, patient history, and epidemiological information. The expected result is Negative.  Fact Sheet for Patients:  EntrepreneurPulse.com.au  Fact Sheet for Healthcare Providers:  IncredibleEmployment.be  This test is no t yet approved or cleared by the [place] FDA and  has been authorized for detection and/or diagnosis of  SARS-CoV-2 by FDA under an Emergency Use Authorization (EUA). This EUA will remain  in effect (meaning this test can be used) for the duration of the COVID-19 declaration under Section 564(b)(1) of the Act, 21 U.S.C.section 360bbb-3(b)(1), unless the authorization is terminated  or revoked sooner.       Influenza A by PCR NEGATIVE NEGATIVE Final   Influenza B by PCR NEGATIVE NEGATIVE Final    Comment: (NOTE) The Xpert Xpress SARS-CoV-2/FLU/RSV plus assay is intended as an aid in the diagnosis of influenza from Nasopharyngeal swab specimens and should not be used as a sole basis for treatment. Nasal washings and aspirates are unacceptable for Xpert Xpress SARS-CoV-2/FLU/RSV testing.  Fact Sheet for Patients: EntrepreneurPulse.com.au  Fact Sheet for Healthcare Providers: IncredibleEmployment.be  This test is not yet approved or cleared by the [place] FDA and has been authorized for detection and/or diagnosis of SARS-CoV-2 by FDA under an Emergency Use Authorization (EUA). This EUA will remain in effect (meaning this test can be used) for the duration of the COVID-19 declaration under Section 564(b)(1) of the Act, 21 U.S.C. section 360bbb-3(b)(1), unless the authorization is terminated or revoked.  Performed at [facility], [place] [address], [place], [place] [address]   Blood Culture ID Panel (Reflexed)     Status: Abnormal   Collection Time: 09/19/21 11:32 AM  Result Value Ref Range Status   Enterococcus faecalis NOT DETECTED NOT DETECTED Final   Enterococcus Faecium NOT DETECTED NOT DETECTED Final   Listeria monocytogenes NOT DETECTED NOT DETECTED Final   Staphylococcus species NOT DETECTED NOT DETECTED Final   Staphylococcus aureus (BCID) NOT DETECTED NOT DETECTED Final   Staphylococcus epidermidis NOT DETECTED NOT DETECTED Final   Staphylococcus lugdunensis NOT DETECTED NOT DETECTED Final   Streptococcus species  NOT DETECTED NOT DETECTED Final   Streptococcus agalactiae NOT DETECTED NOT DETECTED Final   Streptococcus pneumoniae NOT DETECTED NOT DETECTED Final   Streptococcus pyogenes NOT DETECTED NOT DETECTED Final   A.calcoaceticus-baumannii NOT DETECTED NOT DETECTED Final   Bacteroides fragilis NOT DETECTED NOT DETECTED Final   Enterobacterales DETECTED (A) NOT DETECTED Final    Comment: Enterobacterales represent a large order of gram negative bacteria, not a single organism. CRITICAL RESULT CALLED TO, READ BACK BY AND VERIFIED WITH: PHARMD ELLEN JACKSON 09/20/21@1 :322 BY TW    Enterobacter cloacae complex NOT DETECTED NOT DETECTED Final   Escherichia coli DETECTED (A) NOT DETECTED Final    Comment: CRITICAL RESULT CALLED TO, READ BACK BY AND VERIFIED WITH: PHARMD ELLEN JACKSON 09/20/21@1 :322 BY TW    Klebsiella aerogenes NOT DETECTED NOT DETECTED Final   Klebsiella  oxytoca NOT DETECTED NOT DETECTED Final   Klebsiella pneumoniae NOT DETECTED NOT DETECTED Final   Proteus species NOT DETECTED NOT DETECTED Final   Salmonella species NOT DETECTED NOT DETECTED Final   Serratia marcescens NOT DETECTED NOT DETECTED Final   Haemophilus influenzae NOT DETECTED NOT DETECTED Final   Neisseria meningitidis NOT DETECTED NOT DETECTED Final   Pseudomonas aeruginosa NOT DETECTED NOT DETECTED Final   Stenotrophomonas maltophilia NOT DETECTED NOT DETECTED Final   Candida albicans NOT DETECTED NOT DETECTED Final   Candida auris NOT DETECTED NOT DETECTED Final   Candida glabrata NOT DETECTED NOT DETECTED Final   Candida krusei NOT DETECTED NOT DETECTED Final   Candida parapsilosis NOT DETECTED NOT DETECTED Final   Candida tropicalis NOT DETECTED NOT DETECTED Final   Cryptococcus neoformans/gattii NOT DETECTED NOT DETECTED Final   CTX-M ESBL NOT DETECTED NOT DETECTED Final   Carbapenem resistance IMP NOT DETECTED NOT DETECTED Final   Carbapenem resistance KPC NOT DETECTED NOT DETECTED Final   Carbapenem  resistance NDM NOT DETECTED NOT DETECTED Final   Carbapenem resist OXA 48 LIKE NOT DETECTED NOT DETECTED Final   Carbapenem resistance VIM NOT DETECTED NOT DETECTED Final    Comment: Performed at [facility], [address] [place], [place] [address]  Culture, blood (routine x 2)     Status: Abnormal   Collection Time: 09/19/21  1:20 PM   Specimen: BLOOD  Result Value Ref Range Status   Specimen Description   Final    BLOOD BLOOD RIGHT WRIST Performed at [place] [address] [place], [place] [address]    Special Requests   Final    BOTTLES DRAWN AEROBIC AND ANAEROBIC Blood Culture adequate volume Performed at [facility] [address] [place], [place] [address]    Culture  Setup Time   Final    GRAM NEGATIVE RODS ANAEROBIC BOTTLE ONLY CRITICAL VALUE NOTED.  VALUE IS CONSISTENT WITH PREVIOUSLY REPORTED AND CALLED VALUE. IN BOTH AEROBIC AND ANAEROBIC BOTTLES    Culture (A)  Final    ESCHERICHIA COLI SUSCEPTIBILITIES PERFORMED ON PREVIOUS CULTURE WITHIN THE LAST 5 DAYS. Performed at [facility], [place] [address], [place], [place] [address]    Report Status 09/22/2021 FINAL  Final  MRSA Next Gen by PCR, Nasal     Status: None   Collection Time: 09/19/21  9:55 PM   Specimen: Nasal Mucosa; Nasal Swab  Result Value Ref Range Status   MRSA by PCR Next Gen NOT DETECTED NOT DETECTED Final    Comment: (NOTE) The GeneXpert MRSA Assay (FDA approved for NASAL specimens only), is one component of a comprehensive MRSA colonization surveillance program. It is not intended to diagnose MRSA infection nor to guide or monitor treatment for MRSA infections. Test performance is not FDA approved in patients less than 72 years old. Performed at [facility], [place] [address], [place], [place] [address]           Radiology Studies: No results found.      Scheduled Meds:  ARIPiprazole  10 mg Oral Daily    aspirin  81 mg Oral Daily   Chlorhexidine Gluconate Cloth  6 each Topical Q0600   DULoxetine  60 mg Oral BID   enoxaparin (LOVENOX) injection  40 mg Subcutaneous Q24H   gabapentin  600 mg Oral BID   levothyroxine  25 mcg Oral QAC breakfast   pravastatin  40 mg Oral Daily   Continuous  Infusions:   LOS: 9 days    Time spent: 35 minutes    Irine Seal, MD [facility]   To contact the attending provider between 7A-7P or the covering provider during after hours 7P-7A, please log into the web site [email or web address] and access using universal [place] password for that web site. If you do not have the password, please call the hospital operator.  09/28/2021, 1:02 PM

## 2021-09-29 NOTE — Progress Notes (Signed)
PROGRESS NOTE    [name]  OXB:[id number] DOB: 02-27-1946 DOA: 09/19/2021 PCP: [name], MD   Chief Complaint  Patient presents with   Altered Mental Status    Brief Narrative:  75 y.o. female with medical history significant of anal cancer, anxiety, osteoarthritis, depression, hyperlipidemia, history of rectal bleeding, history of Staphylococcus bacteremia with sepsis who is coming to the emergency department due to altered mental status.  She is normally fully oriented. Pt was found to be floridly septic with UTI and later ecoli bacteremia     Assessment & Plan:   Principal Problem:   Sepsis due to undetermined organism [facility]) Active Problems:   Hyperlipidemia   Depression   Anxiety   Acute metabolic encephalopathy   Hypothyroid   Hypokalemia   Leukopenia   Prolonged QT interval   E coli bacteremia   Acute lower UTI  1 severe sepsis secondary to E. coli bacteremia and UTI, POA -Patient presented with criteria for sepsis with leukocytosis, lactic acidosis, tachycardia, tachypnea. -Patient pancultured with blood cultures positive for E. coli. -Urinalysis consistent with UTI, however urine cultures not obtained. -Patient was on IV Rocephin, IV vancomycin, IV Flagyl and subsequently antibiotics narrowed down to Bactrim and completed 7-day course of antibiotic treatment. -Supportive care.  2.  Acute metabolic encephalopathy -Secondary to problem #1. -Clinical improvement.   -At baseline.    3.  Leukopenia -Resolved.  4.  Depression/anxiety -Stable.   -Continue Cymbalta, trazodone as needed nightly, Abilify which patient has been on for 2 years started in the outpatient setting and likely to help augment patient's Cymbalta.  Patient with no suicidal or homicidal ideation.  Patient with no agitation. -Outpatient follow-up.  5.  Hypothyroidism -Continue Synthroid.   -Outpatient follow-up with PCP.   6.  Lactic acidosis -Secondary to problem #1 and  dehydration.   -Resolved.   7.  Debility -PT/OT following recommending SNF placement.   -TOC consulted for SNF placement.   8.  QTc prolongation -Resolved.   DVT prophylaxis: Lovenox Code Status: Full Family Communication: Updated patient.  No family at bedside. Disposition:   Status is: Inpatient  Remains inpatient appropriate because: Unsafe discharge, awaiting SNF placement.       Consultants:  Wound care RN 09/25/2021  Procedures: Chest x-ray 09/19/2021  Antimicrobials:  Bactrim 09/23/2021>>>> 09/26/2021 IV cefepime 09/19/2021>>>> 09/20/2021 IV Rocephin 09/20/2021>>>> 09/23/2021 IV Flagyl 09/19/2021>>>> 09/21/2021 IV vancomycin 09/19/2021>>>>> 09/21/2021   Subjective: Sitting up in chair.  No chest pain.  No shortness of breath.  Awaiting SNF placement.   Objective: Vitals:   09/28/21 0628 09/28/21 1400 09/28/21 1900 09/29/21 0339  BP: 119/61 125/78 129/68 (!) 146/75  Pulse: 80 68 84 100  Resp: 20 20 14 18   Temp: (!) 97.4 F (36.3 C) (!) 97.4 F (36.3 C) 98 F (36.7 C) 97.6 F (36.4 C)  TempSrc: Oral Oral Oral Oral  SpO2: 100% 100% 98% 93%  Weight:      Height:        Intake/Output Summary (Last 24 hours) at 09/29/2021 1231 Last data filed at 09/28/2021 1939 Gross per 24 hour  Intake --  Output 200 ml  Net -200 ml    Filed Weights   09/19/21 1107  Weight: 72.6 kg    Examination:  General exam: : NAD Respiratory system: CTA B.  No wheezes, no rhonchi.  Speaking in full sentences.  Normal respiratory effort. Cardiovascular system: Regular rate and rhythm no murmurs rubs or gallops.  No JVD.  No lower  extremity edema.  Gastrointestinal system: Abdomen soft, nontender, nondistended, positive bowel sounds.  No rebound.  No guarding. Central nervous system: Alert and oriented. No focal neurological deficits. Extremities: Symmetric 5 x 5 power. Skin: No rashes, lesions or ulcers Psychiatry: Judgement and insight appear normal. Mood & affect  appropriate.  Data Reviewed: I have personally reviewed following labs and imaging studies  CBC: Recent Labs  Lab 09/23/21 0500 09/24/21 0516  WBC 5.7 5.2  HGB 9.4* 10.0*  HCT 29.8* 30.4*  MCV 97.7 95.9  PLT 147* 165     Basic Metabolic Panel: Recent Labs  Lab 09/23/21 0500 09/24/21 0516 09/25/21 0458 09/26/21 0531 09/28/21 0908  NA 143 141 139 136 135  K 4.2 3.6 3.6 3.9 3.9  CL 109 108 107 103 102  CO2 27 27 25 24 25   GLUCOSE 89 95 103* 97 144*  BUN 18 15 11 13 17   CREATININE 0.47 0.71 0.65 0.77 0.85  CALCIUM 8.4* 8.3* 8.3* 8.6* 9.0     GFR: Estimated Creatinine Clearance: 54.2 mL/min (by C-G formula based on SCr of 0.85 mg/dL).  Liver Function Tests: Recent Labs  Lab 09/23/21 0500  AST 39  ALT 35  ALKPHOS 74  BILITOT 1.2  PROT 5.6*  ALBUMIN 2.6*     CBG: Recent Labs  Lab 09/22/21 1606 09/22/21 2229 09/23/21 0712 09/23/21 1128 09/23/21 1635  GLUCAP 82 93 75 109* 76      Recent Results (from the past 240 hour(s))  Culture, blood (routine x 2)     Status: Abnormal   Collection Time: 09/19/21  1:20 PM   Specimen: BLOOD  Result Value Ref Range Status   Specimen Description   Final    BLOOD BLOOD RIGHT WRIST Performed at [facility] [address], [place], [place] [address]    Special Requests   Final    BOTTLES DRAWN AEROBIC AND ANAEROBIC Blood Culture adequate volume Performed at [facility] [address] [place], [place] [address]    Culture  Setup Time   Final    GRAM NEGATIVE RODS ANAEROBIC BOTTLE ONLY CRITICAL VALUE NOTED.  VALUE IS CONSISTENT WITH PREVIOUSLY REPORTED AND CALLED VALUE. IN BOTH AEROBIC AND ANAEROBIC BOTTLES    Culture (A)  Final    ESCHERICHIA COLI SUSCEPTIBILITIES PERFORMED ON PREVIOUS CULTURE WITHIN THE LAST 5 DAYS. Performed at [facility], [place] [address], [place], [place] [address]    Report Status 09/22/2021 FINAL  Final  MRSA Next Gen by PCR, Nasal      Status: None   Collection Time: 09/19/21  9:55 PM   Specimen: Nasal Mucosa; Nasal Swab  Result Value Ref Range Status   MRSA by PCR Next Gen NOT DETECTED NOT DETECTED Final    Comment: (NOTE) The GeneXpert MRSA Assay (FDA approved for NASAL specimens only), is one component of a comprehensive MRSA colonization surveillance program. It is not intended to diagnose MRSA infection nor to guide or monitor treatment for MRSA infections. Test performance is not FDA approved in patients less than 51 years old. Performed at [facility], [place], [place] [address] [place], [place] [address]   Resp Panel by RT-PCR (Flu A&B, Covid) Nasopharyngeal Swab     Status: None   Collection Time: 09/28/21 12:16 PM   Specimen: Nasopharyngeal Swab; Nasopharyngeal(NP) swabs in vial transport medium  Result Value Ref Range Status   SARS Coronavirus 2 by RT PCR NEGATIVE NEGATIVE Final    Comment: (NOTE) SARS-CoV-2  target nucleic acids are NOT DETECTED.  The SARS-CoV-2 RNA is generally detectable in upper respiratory specimens during the acute phase of infection. The lowest concentration of SARS-CoV-2 viral copies this assay can detect is 138 copies/mL. A negative result does not preclude SARS-Cov-2 infection and should not be used as the sole basis for treatment or other patient management decisions. A negative result may occur with  improper specimen collection/handling, submission of specimen other than nasopharyngeal swab, presence of viral mutation(s) within the areas targeted by this assay, and inadequate number of viral copies(<138 copies/mL). A negative result must be combined with clinical observations, patient history, and epidemiological information. The expected result is Negative.  Fact Sheet for Patients:  EntrepreneurPulse.com.au  Fact Sheet for Healthcare Providers:  IncredibleEmployment.be  This test is no t yet approved or cleared by the  [place] FDA and  has been authorized for detection and/or diagnosis of SARS-CoV-2 by FDA under an Emergency Use Authorization (EUA). This EUA will remain  in effect (meaning this test can be used) for the duration of the COVID-19 declaration under Section 564(b)(1) of the Act, 21 U.S.C.section 360bbb-3(b)(1), unless the authorization is terminated  or revoked sooner.       Influenza A by PCR NEGATIVE NEGATIVE Final   Influenza B by PCR NEGATIVE NEGATIVE Final    Comment: (NOTE) The Xpert Xpress SARS-CoV-2/FLU/RSV plus assay is intended as an aid in the diagnosis of influenza from Nasopharyngeal swab specimens and should not be used as a sole basis for treatment. Nasal washings and aspirates are unacceptable for Xpert Xpress SARS-CoV-2/FLU/RSV testing.  Fact Sheet for Patients: EntrepreneurPulse.com.au  Fact Sheet for Healthcare Providers: IncredibleEmployment.be  This test is not yet approved or cleared by the [place] FDA and has been authorized for detection and/or diagnosis of SARS-CoV-2 by FDA under an Emergency Use Authorization (EUA). This EUA will remain in effect (meaning this test can be used) for the duration of the COVID-19 declaration under Section 564(b)(1) of the Act, 21 U.S.C. section 360bbb-3(b)(1), unless the authorization is terminated or revoked.  Performed at [facility], [place] [address], [place], [place] [address]           Radiology Studies: No results found.      Scheduled Meds:  ARIPiprazole  10 mg Oral Daily   aspirin  81 mg Oral Daily   Chlorhexidine Gluconate Cloth  6 each Topical Q0600   DULoxetine  60 mg Oral BID   enoxaparin (LOVENOX) injection  40 mg Subcutaneous Q24H   gabapentin  600 mg Oral BID   levothyroxine  25 mcg Oral QAC breakfast   pravastatin  40 mg Oral Daily   Continuous Infusions:   LOS: 10 days    Time spent: 35 minutes    Irine Seal,  MD [facility]   To contact the attending provider between 7A-7P or the covering provider during after hours 7P-7A, please log into the web site [email or web address] and access using universal  password for that web site. If you do not have the password, please call the hospital operator.  09/29/2021, 12:31 PM

## 2021-09-29 NOTE — Progress Notes (Signed)
Physical Therapy Treatment Patient Details Name: [name]: [id number] DOB: 1946-04-25 Today's Date: 09/29/2021   History of Present Illness Pt is 75 yo female who presented to ED with altered mental status. PMH significant of anal cancer, anxiety, osteoarthritis, depression, hyperlipidemia, history of rectal bleeding, history of Staphylococcus bacteremia with sepsis    PT Comments    Patient progressing well with mobility and ambulated slightly increased distance today.  She continue to require rest breaks duet to fatigue throughout. EOS pt completed exercises for Rt knee ROM; low load long duration stretch for knee extension provided at EOS and pt educated on duration to complete and gentle self mobilization to femur to encourage knee extension. Pt will benefit from skilled PT at SNF setting to progress mobility for safe return home. Acute PT will continue to progress as able.    Recommendations for follow up therapy are one component of a multi-disciplinary discharge planning process, led by the attending physician.  Recommendations may be updated based on patient status, additional functional criteria and insurance authorization.  Follow Up Recommendations  Skilled nursing-short term rehab (<3 hours/day)     Assistance Recommended at Discharge Frequent or constant Supervision/Assistance  Equipment Recommendations  None recommended by PT    Recommendations for Other Services       Precautions / Restrictions Precautions Precautions: Fall Precaution Comments: TKR 8/22 was still going to OP Restrictions Weight Bearing Restrictions: No     Mobility  Bed Mobility Overal bed mobility: Needs Assistance Bed Mobility: Supine to Sit     Supine to sit: Supervision;HOB elevated;Modified independent (Device/Increase time)     General bed mobility comments: pt taking extra time and HOB elevated. pt using bed rails    Transfers Overall transfer level: Needs  assistance Equipment used: 2 person hand held assist Transfers: Sit to/from Stand Sit to Stand: Min assist;Min guard           General transfer comment: pt initiated power up and min assist needed to complete rise from EOB.    Ambulation/Gait Ambulation/Gait assistance: Min assist Gait Distance (Feet): 150 Feet Assistive device: Rolling walker (2 wheels);None Gait Pattern/deviations: Step-through pattern;Decreased step length - right;Decreased step length - left Gait velocity: decr     General Gait Details: min assist to manage walker position and cues for proximity. no overt LOB noted throughout. pt took standing rest break halfway. VSS throughout.   Stairs             Wheelchair Mobility    Modified Rankin (Stroke Patients Only)       Balance Overall balance assessment: Needs assistance;History of Falls Sitting-balance support: Bilateral upper extremity supported;Feet supported Sitting balance-Leahy Scale: Fair     Standing balance support: Bilateral upper extremity supported;During functional activity;Reliant on assistive device for balance Standing balance-Leahy Scale: Poor                              Cognition Arousal/Alertness: Awake/alert Behavior During Therapy: WFL for tasks assessed/performed Overall Cognitive Status: Within Functional Limits for tasks assessed                                          Exercises Total Joint Exercises Ankle Circles/Pumps: AROM;Both;10 reps;Seated Quad Sets: AROM;Both;10 reps;Seated Short Arc Quad: AROM;Right;10 reps;Seated Heel Slides: AROM;Right;10 reps;Seated Other Exercises Other Exercises: LLLD stretch with  bolster under heel to facilitate Rt knee extension (educated to maintain for ~15 minutes.    General Comments        Pertinent Vitals/Pain Pain Assessment: No/denies pain    Home Living                          Prior Function            PT Goals  (current goals can now be found in the care plan section) Acute Rehab PT Goals Patient Stated Goal: get home to her puppy PT Goal Formulation: Patient unable to participate in goal setting Time For Goal Achievement: 10/07/21 Potential to Achieve Goals: Fair Progress towards PT goals: Progressing toward goals    Frequency    Min 2X/week      PT Plan Current plan remains appropriate    Co-evaluation              AM-PAC PT "6 Clicks" Mobility   Outcome Measure  Help needed turning from your back to your side while in a flat bed without using bedrails?: A Little Help needed moving from lying on your back to sitting on the side of a flat bed without using bedrails?: A Little Help needed moving to and from a bed to a chair (including a wheelchair)?: A Little Help needed standing up from a chair using your arms (e.g., wheelchair or bedside chair)?: A Little Help needed to walk in hospital room?: A Little Help needed climbing 3-5 steps with a railing? : A Lot 6 Click Score: 17    End of Session Equipment Utilized During Treatment: Gait belt Activity Tolerance: Patient limited by fatigue Patient left: in chair;with call bell/phone within reach;with chair alarm set Nurse Communication: Mobility status PT Visit Diagnosis: Difficulty in walking, not elsewhere classified (R26.2)     Time: [id number] PT Time Calculation (min) (ACUTE ONLY): 24 min  Charges:  $Gait Training: 8-22 mins $Therapeutic Exercise: 8-22 mins                     Verner Mould, DPT Acute Rehabilitation Services Office [phone] Pager [phone]    Jacques Navy 09/29/2021, 1:08 PM

## 2021-09-30 NOTE — Progress Notes (Signed)
Patient left with PTAR via stretcher going to [facility]. IV was removed from dayshift nurse.

## 2021-09-30 NOTE — Progress Notes (Signed)
Report called to Burnett Kanaris, LPN at [facility].

## 2021-09-30 NOTE — Progress Notes (Signed)
Occupational Therapy Treatment Patient Details Name: [name]  MRN: [id number] DOB: [birth year] Today's Date: 09/30/2021   History of present illness Pt is 75 yo female who presented to ED with altered mental status. PMH significant of anal cancer, anxiety, osteoarthritis, depression, hyperlipidemia, history of rectal bleeding, history of Staphylococcus bacteremia with sepsis   OT comments  Patient with good progress toward all patient focused goals.  Her mentation has cleared, and she is following commands well.  Deficits impacting independence continue to be listed below.  She is needing up to [facility] for in room mobility/toileting at [place] level, and up to [place] for lower body ADL from a sit/stand level.  OT will continue to follow in the acute setting, but patient has no assist at home, and is hoping to transition to SNF for short term rehab prior to returning home.       Recommendations for follow up therapy are one component of a multi-disciplinary discharge planning process, led by the attending physician.  Recommendations may be updated based on patient status, additional functional criteria and insurance authorization.    Follow Up Recommendations  Skilled nursing-short term rehab (<3 hours/day)    Assistance Recommended at Discharge Frequent or constant Supervision/Assistance  Equipment Recommendations       Recommendations for Other Services      Precautions / Restrictions Precautions Precautions: Fall Precaution Comments: TKR 8/22 was still going to OP Restrictions Weight Bearing Restrictions: No       Mobility Bed Mobility               General bed mobility comments: up in the recliner    Transfers Overall transfer level: Needs assistance   Transfers: Sit to/from Stand Sit to Stand: Min guard                 Balance Overall balance assessment: Needs assistance;History of Falls Sitting-balance support: Feet supported Sitting balance-Leahy  Scale: Good     Standing balance support: Reliant on assistive device for balance Standing balance-Leahy Scale: Poor Standing balance comment: needing RW for support                           ADL either performed or assessed with clinical judgement   ADL       Grooming: Wash/dry hands;Wash/dry face;Oral care;Min guard;Standing       Lower Body Bathing: Minimal assistance;Sit to/from stand       Lower Body Dressing: Minimal assistance;Sit to/from stand   Toilet Transfer: Regular Toilet;Rolling walker (2 wheels);Min guard                  Extremity/Trunk Assessment Upper Extremity Assessment Upper Extremity Assessment: Overall WFL for tasks assessed   Lower Extremity Assessment Lower Extremity Assessment: Defer to PT evaluation        Vision Baseline Vision/History: 1 Wears glasses Patient Visual Report: No change from baseline     Perception Perception Perception: Not tested   Praxis Praxis Praxis: Not tested    Cognition Arousal/Alertness: Awake/alert Behavior During Therapy: WFL for tasks assessed/performed Overall Cognitive Status: Within Functional Limits for tasks assessed  Pertinent Vitals/ Pain       Pain Assessment: Faces Faces Pain Scale: Hurts a little bit Pain Descriptors / Indicators: Dull;Sore Pain Intervention(s): Monitored during session                                                          Frequency  Min 2X/week        Progress Toward Goals  OT Goals(current goals can now be found in the care plan section)  Progress towards OT goals: Progressing toward goals  Acute Rehab OT Goals Patient Stated Goal: I'm hoping I get to go to rehab soon OT Goal Formulation: With patient Time For Goal Achievement: 10/07/21 Potential to Achieve Goals: Good  Plan Discharge plan remains appropriate     Co-evaluation                 AM-PAC OT "6 Clicks" Daily Activity     Outcome Measure   Help from another person eating meals?: None Help from another person taking care of personal grooming?: None Help from another person toileting, which includes using toliet, bedpan, or urinal?: A Little Help from another person bathing (including washing, rinsing, drying)?: A Little Help from another person to put on and taking off regular upper body clothing?: None Help from another person to put on and taking off regular lower body clothing?: A Little 6 Click Score: 21    End of Session Equipment Utilized During Treatment: Rolling walker (2 wheels)      Activity Tolerance Patient tolerated treatment well   Patient Left in chair;with call bell/phone within reach   Nurse Communication          Time: [phone] OT Time Calculation (min): 21 min  Charges: OT General Charges $OT Visit: 1 Visit OT Treatments $Self Care/Home Management : 8-22 mins  09/30/2021  RP, OTR/L  Acute Rehabilitation Services  Office:  [phone]   Metta Clines 09/30/2021, 9:22 AM

## 2021-09-30 NOTE — TOC Transition Note (Signed)
Transition of Care [facility]) - CM/SW Discharge Note   Patient Details  Name: [name]: [id number] Date of Birth: [birth year]  Transition of Care [facility]) CM/SW Contact:  Trish Mage, LCSW Phone Number: 09/30/2021, 10:04 AM   Clinical Narrative:   Patient who has been medically stable since Monday now has insurance authorization to go to [facility].  Family informed.  PTAR arranged.  Nursing, please call report to [phone].  TOC sign off.    Final next level of care: Skilled Nursing Facility Barriers to Discharge: Barriers Resolved   Patient Goals and CMS Choice     Choice offered to / list presented to : Patient, Adult Children  Discharge Placement                       Discharge Plan and Services     Post Acute Care Choice: [place]                               Social Determinants of Health (SDOH) Interventions     Readmission Risk Interventions No flowsheet data found.

## 2021-09-30 NOTE — Discharge Summary (Signed)
Physician Discharge Summary  LYNDSEE CASA YIA:[id number] DOB: [birth year] DOA: 09/19/2021  PCP: [name], MD  Admit date: 09/19/2021 Discharge date: 09/30/2021  Admitted From: Home Disposition: Skilled nursing facility  Recommendations for Outpatient Follow-up:  Follow up with PCP in 1-2 weeks Please obtain BMP/CBC in one week   Home Health: N/A Equipment/Devices: N/A  Discharge Condition: Stable CODE STATUS: Full code Diet recommendation: Regular diet  Discharge summary: 75 year old with history of anal cancer, anxiety, osteoarthritis, depression and hyperlipidemia presented to the emergency room with altered mental status.  She was found to be with severe sepsis with UTI and E. coli bacteremia.  Treated in the hospital with complete improvement.  Severe sepsis secondary to E. coli bacteremia and UTI present on admission.  Improved.  Completed antibiotics.  Medically stable now.  Acute metabolic encephalopathy secondary to sepsis: At her baseline.  Mental status improved and normalized.  Depression/anxiety: Patient on Cymbalta and trazodone.  Patient is on Abilify to augment her Cymbalta.  Her symptoms are well controlled.  Hypothyroidism: Stable on Synthroid.  Soft tissue injury left toe: Uncomplicated.  Local iodine application and keep it dry.  Physical debility: Due to acute illnesses.  Will benefit with inpatient therapies at a skilled nursing facility.  He is stable for transfer.   Discharge Diagnoses:  Principal Problem:   Sepsis due to undetermined organism [facility]) Active Problems:   Hyperlipidemia   Depression   Anxiety   Acute metabolic encephalopathy   Hypothyroid   Hypokalemia   Leukopenia   Prolonged QT interval   E coli bacteremia   Acute lower UTI    Discharge Instructions  Discharge Instructions     Diet - low sodium heart healthy   Complete by: As directed    Discharge wound care:   Complete by: As directed    Apply iodine from the  swabsticks or swab pads from clean utility to left great toe discolored area.  Allow to air dry.   Increase activity slowly   Complete by: As directed       Allergies as of 09/30/2021   No Known Allergies      Medication List     STOP taking these medications    methocarbamol 500 MG tablet Commonly known as: ROBAXIN   traMADol 50 MG tablet Commonly known as: ULTRAM       TAKE these medications    acetaminophen 650 MG CR tablet Commonly known as: TYLENOL Take 650 mg by mouth every 8 (eight) hours as needed for pain.   ARIPiprazole 10 MG tablet Commonly known as: ABILIFY Take 10 mg by mouth in the morning.   aspirin 81 MG chewable tablet Chew 1 tablet (81 mg total) by mouth 2 (two) times daily. What changed: when to take this   CALCIUM 600+D3 PO Take 1 tablet by mouth in the morning.   celecoxib 100 MG capsule Commonly known as: CeleBREX Take 1 capsule (100 mg total) by mouth daily as needed for mild pain.   cholecalciferol 25 MCG (1000 UNIT) tablet Commonly known as: VITAMIN D Take 1,000 Units by mouth in the morning.   DULoxetine 60 MG capsule Commonly known as: CYMBALTA Take 60 mg by mouth 2 (two) times daily.   gabapentin 600 MG tablet Commonly known as: NEURONTIN Take 600 mg by mouth 2 (two) times daily.   levothyroxine 25 MCG tablet Commonly known as: SYNTHROID Take 25 mcg by mouth daily before breakfast.   multivitamin with minerals Tabs tablet Take 1 tablet  by mouth in the morning. Centrum Silver for Women   pravastatin 40 MG tablet Commonly known as: PRAVACHOL Take 40 mg by mouth daily.   traZODone 100 MG tablet Commonly known as: DESYREL Take 100 mg by mouth at bedtime.   vitamin B-12 500 MCG tablet Commonly known as: CYANOCOBALAMIN Take 500 mcg by mouth in the morning.               Discharge Care Instructions  (From admission, onward)           Start     Ordered   09/30/21 0000  Discharge wound care:        Comments: Apply iodine from the swabsticks or swab pads from clean utility to left great toe discolored area.  Allow to air dry.   09/30/21 4765            Contact information for follow-up providers     [name], MD Follow up in 2 week(s).   Specialty: Family Medicine Contact information: [address] [place] [address] [phone]              Contact information for after-discharge care     Destination     [facility] Preferred SNF .   Service: Skilled Nursing Contact information: [address] [place] [phone]                    No Known Allergies  Consultations: None   Procedures/Studies: Epidural Steroid injection  Result Date: 09/10/2021 Magnus Sinning, MD     09/27/2021  5:06 PM Lumbosacral Transforaminal Epidural Steroid Injection - Sub-Pedicular Approach with Fluoroscopic Guidance Patient: CHARDA JANIS     Date of Birth: 29-Oct-1946 MRN: [id number] PCP: [name], MD     Visit Date: 09/10/2021  Universal Protocol:   Date/Time: 09/10/2021 Consent Given By: the patient Position: PRONE Additional Comments: Vital signs were monitored before and after the procedure. Patient was prepped and draped in the usual sterile fashion. The correct patient, procedure, and site was verified. Injection Procedure Details: Procedure diagnoses: Lumbar radiculopathy [M54.16]  Meds Administered: Meds ordered this encounter Medications  methylPREDNISolone acetate (DEPO-MEDROL) injection 80 mg Laterality: Right Location/Site: L3 Needle:5.0 in., 22 ga.  Short bevel or Quincke spinal needle Needle Placement: Transforaminal Findings:   -Comments: Excellent flow of contrast along the nerve, nerve root and into the epidural space. Procedure Details: After squaring off the end-plates to get a true AP view, the C-arm was positioned so that an oblique view of the foramen as noted above was visualized. The  target area is just inferior to the "nose of the scotty dog" or sub pedicular. The soft tissues overlying this structure were infiltrated with 2-3 ml. of 1% Lidocaine without Epinephrine. The spinal needle was inserted toward the target using a "trajectory" view along the fluoroscope beam.  Under AP and lateral visualization, the needle was advanced so it did not puncture dura and was located close the 6 O'Clock position of the pedical in AP tracterory. Biplanar projections were used to confirm position. Aspiration was confirmed to be negative for CSF and/or blood. A 1-2 ml. volume of Isovue-250 was injected and flow of contrast was noted at each level. Radiographs were obtained for documentation purposes. After attaining the desired flow of contrast documented above, a 0.5 to 1.0 ml test dose of 0.25% Marcaine was injected into each respective transforaminal space.  The patient was observed for 90 seconds  post injection.  After no sensory deficits were reported, and normal lower extremity motor function was noted,   the above injectate was administered so that equal amounts of the injectate were placed at each foramen (level) into the transforaminal epidural space. Additional Comments: No complications occurred Dressing: 2 x 2 sterile gauze and Band-Aid  Post-procedure details: Patient was observed during the procedure. Post-procedure instructions were reviewed. Patient left the clinic in stable condition.   DG Chest Port 1 View  Result Date: 09/19/2021 CLINICAL DATA:  Possible sepsis EXAM: PORTABLE CHEST 1 VIEW COMPARISON:  Chest x-ray 06/25/2021 FINDINGS: Heart size is normal. Mediastinum appears stable. Calcified plaques in the aortic arch. No focal consolidation identified. No pleural effusion or pneumothorax visualized. IMPRESSION: No acute intrathoracic process identified. Electronically Signed   By: Ofilia Neas M.D.   On: 09/19/2021 12:35   XR C-ARM NO REPORT  Result Date: 09/10/2021 Please  see Notes tab for imaging impression.  (Echo, Carotid, EGD, Colonoscopy, ERCP)    Subjective: Patient seen and examined.  She is slightly weaker than at her baseline however denies any complaints.  Denies any nausea vomiting.  Afebrile.  Motivated to go to rehab and go back home by herself.   Discharge Exam: Vitals:   09/29/21 2049 09/30/21 0340  BP: (!) 131/55 132/61  Pulse: 84 83  Resp: 18 18  Temp: 98 F (36.7 C) 97.9 F (36.6 C)  SpO2: 100% 97%   Vitals:   09/29/21 0339 09/29/21 1654 09/29/21 2049 09/30/21 0340  BP: (!) 146/75 (!) 126/58 (!) 131/55 132/61  Pulse: 100 89 84 83  Resp: 18 18 18 18   Temp: 97.6 F (36.4 C) 98.3 F (36.8 C) 98 F (36.7 C) 97.9 F (36.6 C)  TempSrc: Oral Oral Oral Oral  SpO2: 93% 99% 100% 97%  Weight:      Height:        General: Pt is alert, awake, not in acute distress Cardiovascular: RRR, S1/S2 +, no rubs, no gallops Respiratory: CTA bilaterally, no wheezing, no rhonchi Abdominal: Soft, NT, ND, bowel sounds + Extremities: no edema, no cyanosis Patient has a small scrape and dry wound on the left great toe.  Iodine applied.  Discolored.   The results of significant diagnostics from this hospitalization (including imaging, microbiology, ancillary and laboratory) are listed below for reference.     Microbiology: Recent Results (from the past 240 hour(s))  Resp Panel by RT-PCR (Flu A&B, Covid) Nasopharyngeal Swab     Status: None   Collection Time: 09/28/21 12:16 PM   Specimen: Nasopharyngeal Swab; Nasopharyngeal(NP) swabs in vial transport medium  Result Value Ref Range Status   SARS Coronavirus 2 by RT PCR NEGATIVE NEGATIVE Final    Comment: (NOTE) SARS-CoV-2 target nucleic acids are NOT DETECTED.  The SARS-CoV-2 RNA is generally detectable in upper respiratory specimens during the acute phase of infection. The lowest concentration of SARS-CoV-2 viral copies this assay can detect is 138 copies/mL. A negative result does not  preclude SARS-Cov-2 infection and should not be used as the sole basis for treatment or other patient management decisions. A negative result may occur with  improper specimen collection/handling, submission of specimen other than nasopharyngeal swab, presence of viral mutation(s) within the areas targeted by this assay, and inadequate number of viral copies(<138 copies/mL). A negative result must be combined with clinical observations, patient history, and epidemiological information. The expected result is Negative.  Fact Sheet for Patients:  EntrepreneurPulse.com.au  Fact Sheet for Healthcare Providers:  IncredibleEmployment.be  This test is no t yet approved or cleared by the [place] and  has been authorized for detection and/or diagnosis of SARS-CoV-2 by FDA under an Emergency Use Authorization (EUA). This EUA will remain  in effect (meaning this test can be used) for the duration of the COVID-19 declaration under Section 564(b)(1) of the Act, 21 U.S.C.section 360bbb-3(b)(1), unless the authorization is terminated  or revoked sooner.       Influenza A by PCR NEGATIVE NEGATIVE Final   Influenza B by PCR NEGATIVE NEGATIVE Final    Comment: (NOTE) The Xpert Xpress SARS-CoV-2/FLU/RSV plus assay is intended as an aid in the diagnosis of influenza from Nasopharyngeal swab specimens and should not be used as a sole basis for treatment. Nasal washings and aspirates are unacceptable for Xpert Xpress SARS-CoV-2/FLU/RSV testing.  Fact Sheet for Patients: EntrepreneurPulse.com.au  Fact Sheet for Healthcare Providers: IncredibleEmployment.be  This test is not yet approved or cleared by the [place] FDA and has been authorized for detection and/or diagnosis of SARS-CoV-2 by FDA under an Emergency Use Authorization (EUA). This EUA will remain in effect (meaning this test can be used) for the  duration of the COVID-19 declaration under Section 564(b)(1) of the Act, 21 U.S.C. section 360bbb-3(b)(1), unless the authorization is terminated or revoked.  Performed at [facility], [place] [address] [place], [place] [address]      Labs: BNP (last 3 results) No results for input(s): BNP in the last 8760 hours. Basic Metabolic Panel: Recent Labs  Lab 09/24/21 0516 09/25/21 0458 09/26/21 0531 09/28/21 0908  NA 141 139 136 135  K 3.6 3.6 3.9 3.9  CL 108 107 103 102  CO2 27 25 24 25   GLUCOSE 95 103* 97 144*  BUN 15 11 13 17   CREATININE 0.71 0.65 0.77 0.85  CALCIUM 8.3* 8.3* 8.6* 9.0   Liver Function Tests: No results for input(s): AST, ALT, ALKPHOS, BILITOT, PROT, ALBUMIN in the last 168 hours. No results for input(s): LIPASE, AMYLASE in the last 168 hours. No results for input(s): AMMONIA in the last 168 hours. CBC: Recent Labs  Lab 09/24/21 0516  WBC 5.2  HGB 10.0*  HCT 30.4*  MCV 95.9  PLT 165   Cardiac Enzymes: No results for input(s): CKTOTAL, CKMB, CKMBINDEX, TROPONINI in the last 168 hours. BNP: Invalid input(s): POCBNP CBG: Recent Labs  Lab 09/23/21 1128 09/23/21 1635  GLUCAP 109* 76   D-Dimer No results for input(s): DDIMER in the last 72 hours. Hgb A1c No results for input(s): HGBA1C in the last 72 hours. Lipid Profile No results for input(s): CHOL, HDL, LDLCALC, TRIG, CHOLHDL, LDLDIRECT in the last 72 hours. Thyroid function studies No results for input(s): TSH, T4TOTAL, T3FREE, THYROIDAB in the last 72 hours.  Invalid input(s): FREET3 Anemia work up No results for input(s): VITAMINB12, FOLATE, FERRITIN, TIBC, IRON, RETICCTPCT in the last 72 hours. Urinalysis    Component Value Date/Time   COLORURINE AMBER (A) 09/19/2021 1503   APPEARANCEUR CLOUDY (A) 09/19/2021 1503   LABSPEC 1.011 09/19/2021 1503   PHURINE 6.0 09/19/2021 1503   GLUCOSEU NEGATIVE 09/19/2021 1503   HGBUR LARGE (A) 09/19/2021 1503   BILIRUBINUR  NEGATIVE 09/19/2021 1503   KETONESUR NEGATIVE 09/19/2021 1503   PROTEINUR 30 (A) 09/19/2021 1503   UROBILINOGEN 0.2 11/08/2011 1445   NITRITE NEGATIVE 09/19/2021 1503   LEUKOCYTESUR LARGE (A) 09/19/2021 1503   Sepsis Labs Invalid input(s): PROCALCITONIN,  WBC,  LACTICIDVEN Microbiology Recent Results (from the past  240 hour(s))  Resp Panel by RT-PCR (Flu A&B, Covid) Nasopharyngeal Swab     Status: None   Collection Time: 09/28/21 12:16 PM   Specimen: Nasopharyngeal Swab; Nasopharyngeal(NP) swabs in vial transport medium  Result Value Ref Range Status   SARS Coronavirus 2 by RT PCR NEGATIVE NEGATIVE Final    Comment: (NOTE) SARS-CoV-2 target nucleic acids are NOT DETECTED.  The SARS-CoV-2 RNA is generally detectable in upper respiratory specimens during the acute phase of infection. The lowest concentration of SARS-CoV-2 viral copies this assay can detect is 138 copies/mL. A negative result does not preclude SARS-Cov-2 infection and should not be used as the sole basis for treatment or other patient management decisions. A negative result may occur with  improper specimen collection/handling, submission of specimen other than nasopharyngeal swab, presence of viral mutation(s) within the areas targeted by this assay, and inadequate number of viral copies(<138 copies/mL). A negative result must be combined with clinical observations, patient history, and epidemiological information. The expected result is Negative.  Fact Sheet for Patients:  EntrepreneurPulse.com.au  Fact Sheet for Healthcare Providers:  IncredibleEmployment.be  This test is no t yet approved or cleared by the [place] FDA and  has been authorized for detection and/or diagnosis of SARS-CoV-2 by FDA under an Emergency Use Authorization (EUA). This EUA will remain  in effect (meaning this test can be used) for the duration of the COVID-19 declaration under Section 564(b)(1)  of the Act, 21 U.S.C.section 360bbb-3(b)(1), unless the authorization is terminated  or revoked sooner.       Influenza A by PCR NEGATIVE NEGATIVE Final   Influenza B by PCR NEGATIVE NEGATIVE Final    Comment: (NOTE) The Xpert Xpress SARS-CoV-2/FLU/RSV plus assay is intended as an aid in the diagnosis of influenza from Nasopharyngeal swab specimens and should not be used as a sole basis for treatment. Nasal washings and aspirates are unacceptable for Xpert Xpress SARS-CoV-2/FLU/RSV testing.  Fact Sheet for Patients: EntrepreneurPulse.com.au  Fact Sheet for Healthcare Providers: IncredibleEmployment.be  This test is not yet approved or cleared by the [place] FDA and has been authorized for detection and/or diagnosis of SARS-CoV-2 by FDA under an Emergency Use Authorization (EUA). This EUA will remain in effect (meaning this test can be used) for the duration of the COVID-19 declaration under Section 564(b)(1) of the Act, 21 U.S.C. section 360bbb-3(b)(1), unless the authorization is terminated or revoked.  Performed at [facility], [place] [address] [place], [place] [address]      Time coordinating discharge: 32 minutes  SIGNED:   Barb Merino, MD  [facility] 09/30/2021, 10:39 AM

## 2021-10-01 DIAGNOSIS — R531 Weakness: Secondary | ICD-10-CM | POA: Diagnosis not present

## 2021-10-01 DIAGNOSIS — M48061 Spinal stenosis, lumbar region without neurogenic claudication: Secondary | ICD-10-CM | POA: Diagnosis not present

## 2021-10-01 DIAGNOSIS — M6281 Muscle weakness (generalized): Secondary | ICD-10-CM | POA: Diagnosis not present

## 2021-10-01 DIAGNOSIS — E039 Hypothyroidism, unspecified: Secondary | ICD-10-CM | POA: Diagnosis not present

## 2021-10-01 DIAGNOSIS — F419 Anxiety disorder, unspecified: Secondary | ICD-10-CM | POA: Diagnosis not present

## 2021-10-01 DIAGNOSIS — E785 Hyperlipidemia, unspecified: Secondary | ICD-10-CM | POA: Diagnosis not present

## 2021-10-01 DIAGNOSIS — R7881 Bacteremia: Secondary | ICD-10-CM | POA: Diagnosis not present

## 2021-10-01 DIAGNOSIS — R2681 Unsteadiness on feet: Secondary | ICD-10-CM | POA: Diagnosis not present

## 2021-10-01 DIAGNOSIS — I4581 Long QT syndrome: Secondary | ICD-10-CM | POA: Diagnosis not present

## 2021-10-01 DIAGNOSIS — Z96651 Presence of right artificial knee joint: Secondary | ICD-10-CM | POA: Diagnosis not present

## 2021-10-01 DIAGNOSIS — B9620 Unspecified Escherichia coli [E. coli] as the cause of diseases classified elsewhere: Secondary | ICD-10-CM | POA: Diagnosis not present

## 2021-10-01 DIAGNOSIS — F411 Generalized anxiety disorder: Secondary | ICD-10-CM | POA: Diagnosis not present

## 2021-10-01 DIAGNOSIS — N390 Urinary tract infection, site not specified: Secondary | ICD-10-CM | POA: Diagnosis not present

## 2021-10-01 DIAGNOSIS — G9341 Metabolic encephalopathy: Secondary | ICD-10-CM | POA: Diagnosis not present

## 2021-10-01 DIAGNOSIS — D638 Anemia in other chronic diseases classified elsewhere: Secondary | ICD-10-CM | POA: Diagnosis not present

## 2021-10-01 DIAGNOSIS — F32A Depression, unspecified: Secondary | ICD-10-CM | POA: Diagnosis not present

## 2021-10-01 DIAGNOSIS — G9009 Other idiopathic peripheral autonomic neuropathy: Secondary | ICD-10-CM | POA: Diagnosis not present

## 2021-10-05 DIAGNOSIS — D638 Anemia in other chronic diseases classified elsewhere: Secondary | ICD-10-CM | POA: Diagnosis not present

## 2021-10-08 DIAGNOSIS — F419 Anxiety disorder, unspecified: Secondary | ICD-10-CM | POA: Diagnosis not present

## 2021-10-08 DIAGNOSIS — E785 Hyperlipidemia, unspecified: Secondary | ICD-10-CM | POA: Diagnosis not present

## 2021-10-08 DIAGNOSIS — R531 Weakness: Secondary | ICD-10-CM | POA: Diagnosis not present

## 2021-10-08 DIAGNOSIS — E039 Hypothyroidism, unspecified: Secondary | ICD-10-CM | POA: Diagnosis not present

## 2021-10-09 DIAGNOSIS — G9009 Other idiopathic peripheral autonomic neuropathy: Secondary | ICD-10-CM | POA: Diagnosis not present

## 2021-10-09 DIAGNOSIS — R2681 Unsteadiness on feet: Secondary | ICD-10-CM | POA: Diagnosis not present

## 2021-10-09 DIAGNOSIS — F32A Depression, unspecified: Secondary | ICD-10-CM | POA: Diagnosis not present

## 2021-10-09 DIAGNOSIS — M48061 Spinal stenosis, lumbar region without neurogenic claudication: Secondary | ICD-10-CM | POA: Diagnosis not present

## 2021-10-09 DIAGNOSIS — F411 Generalized anxiety disorder: Secondary | ICD-10-CM | POA: Diagnosis not present

## 2021-10-09 DIAGNOSIS — E039 Hypothyroidism, unspecified: Secondary | ICD-10-CM | POA: Diagnosis not present

## 2021-10-09 DIAGNOSIS — M6281 Muscle weakness (generalized): Secondary | ICD-10-CM | POA: Diagnosis not present

## 2021-10-09 DIAGNOSIS — Z96651 Presence of right artificial knee joint: Secondary | ICD-10-CM | POA: Diagnosis not present

## 2021-10-11 DIAGNOSIS — U071 COVID-19: Secondary | ICD-10-CM | POA: Diagnosis not present

## 2021-10-11 DIAGNOSIS — E0590 Thyrotoxicosis, unspecified without thyrotoxic crisis or storm: Secondary | ICD-10-CM | POA: Diagnosis not present

## 2021-10-11 DIAGNOSIS — A4151 Sepsis due to Escherichia coli [E. coli]: Secondary | ICD-10-CM | POA: Diagnosis not present

## 2021-10-11 DIAGNOSIS — F32A Depression, unspecified: Secondary | ICD-10-CM | POA: Diagnosis not present

## 2021-10-11 DIAGNOSIS — Z471 Aftercare following joint replacement surgery: Secondary | ICD-10-CM | POA: Diagnosis not present

## 2021-10-11 DIAGNOSIS — N390 Urinary tract infection, site not specified: Secondary | ICD-10-CM | POA: Diagnosis not present

## 2021-10-11 DIAGNOSIS — G9341 Metabolic encephalopathy: Secondary | ICD-10-CM | POA: Diagnosis not present

## 2021-10-11 DIAGNOSIS — G4700 Insomnia, unspecified: Secondary | ICD-10-CM | POA: Diagnosis not present

## 2021-10-11 DIAGNOSIS — G8929 Other chronic pain: Secondary | ICD-10-CM | POA: Diagnosis not present

## 2021-10-12 DIAGNOSIS — U071 COVID-19: Secondary | ICD-10-CM | POA: Diagnosis not present

## 2021-10-22 ENCOUNTER — Telehealth: Payer: Self-pay | Admitting: Physical Medicine and Rehabilitation

## 2021-10-22 NOTE — Telephone Encounter (Signed)
Pt called and would like to be scheduled for Right L3 transforaminal epidural steroid injection. She said it helped a lot.Last injection was 09/10/2021.  CB [phone]

## 2021-10-26 ENCOUNTER — Other Ambulatory Visit: Payer: Self-pay | Admitting: Physical Medicine and Rehabilitation

## 2021-10-28 ENCOUNTER — Telehealth: Payer: Self-pay | Admitting: Physical Medicine and Rehabilitation

## 2021-10-28 NOTE — Telephone Encounter (Signed)
Patient requesting prescription refill for Tramadol. Her pharmacy is the [facility] on [address].

## 2021-10-29 ENCOUNTER — Other Ambulatory Visit: Payer: Self-pay | Admitting: Physical Medicine and Rehabilitation

## 2021-10-29 DIAGNOSIS — Z8616 Personal history of COVID-19: Secondary | ICD-10-CM | POA: Diagnosis not present

## 2021-10-29 DIAGNOSIS — E039 Hypothyroidism, unspecified: Secondary | ICD-10-CM | POA: Diagnosis not present

## 2021-10-29 DIAGNOSIS — E785 Hyperlipidemia, unspecified: Secondary | ICD-10-CM | POA: Diagnosis not present

## 2021-10-29 DIAGNOSIS — R5381 Other malaise: Secondary | ICD-10-CM | POA: Diagnosis not present

## 2021-10-29 DIAGNOSIS — N390 Urinary tract infection, site not specified: Secondary | ICD-10-CM | POA: Diagnosis not present

## 2021-10-29 DIAGNOSIS — M25561 Pain in right knee: Secondary | ICD-10-CM | POA: Diagnosis not present

## 2021-10-29 MED ORDER — TRAMADOL HCL 50 MG PO TABS
50.0000 mg | ORAL_TABLET | Freq: Three times a day (TID) | ORAL | 0 refills | Status: AC | PRN
Start: 2021-10-29 — End: 2021-11-03

## 2021-11-10 ENCOUNTER — Ambulatory Visit: Payer: Self-pay

## 2021-11-10 ENCOUNTER — Ambulatory Visit (INDEPENDENT_AMBULATORY_CARE_PROVIDER_SITE_OTHER): Payer: Medicare PPO | Admitting: Physical Medicine and Rehabilitation

## 2021-11-10 ENCOUNTER — Encounter: Payer: Self-pay | Admitting: Physical Medicine and Rehabilitation

## 2021-11-10 ENCOUNTER — Other Ambulatory Visit: Payer: Self-pay

## 2021-11-10 VITALS — BP 150/83 | HR 83

## 2021-11-10 DIAGNOSIS — M5416 Radiculopathy, lumbar region: Secondary | ICD-10-CM | POA: Diagnosis not present

## 2021-11-10 MED ORDER — METHYLPREDNISOLONE ACETATE 80 MG/ML IJ SUSP
80.0000 mg | Freq: Once | INTRAMUSCULAR | Status: AC
  Administered 2021-11-10: 80 mg

## 2021-11-10 NOTE — Progress Notes (Signed)
Pt state lower back pain that travels to her right buttocks. Pt state walking and standing makes the pain worse. Pt state she takes pain meds to help ease her pain.  Numeric Pain Rating Scale and Functional Assessment Average Pain 5   In the last MONTH (on 0-10 scale) has pain interfered with the following?  1. General activity like being  able to carry out your everyday physical activities such as walking, climbing stairs, carrying groceries, or moving a chair?  Rating(8)   +Driver, -BT, -Dye Allergies.

## 2021-11-10 NOTE — Patient Instructions (Signed)

## 2021-11-24 ENCOUNTER — Inpatient Hospital Stay: Payer: Medicare PPO | Admitting: Nurse Practitioner

## 2021-11-26 ENCOUNTER — Other Ambulatory Visit: Payer: Self-pay

## 2021-11-26 ENCOUNTER — Encounter: Payer: Self-pay | Admitting: Nurse Practitioner

## 2021-11-26 ENCOUNTER — Inpatient Hospital Stay: Payer: Medicare PPO | Attending: Nurse Practitioner | Admitting: Nurse Practitioner

## 2021-11-26 VITALS — BP 150/72 | HR 81 | Temp 97.8°F | Resp 18 | Ht 62.0 in | Wt 166.8 lb

## 2021-11-26 DIAGNOSIS — Z85048 Personal history of other malignant neoplasm of rectum, rectosigmoid junction, and anus: Secondary | ICD-10-CM | POA: Insufficient documentation

## 2021-11-26 DIAGNOSIS — C801 Malignant (primary) neoplasm, unspecified: Secondary | ICD-10-CM | POA: Diagnosis not present

## 2021-11-26 NOTE — Progress Notes (Signed)
°  [place] OFFICE PROGRESS NOTE   Diagnosis: Anal cancer  INTERVAL HISTORY:   [name] returns as scheduled.  She was hospitalized in November 2022 with altered mental status, severe sepsis with UTI and E. coli bacteremia.  No change in bowel habits.  No blood or pain with bowel movements.  She reports a good appetite.  Objective:  Vital signs in last 24 hours:  Blood pressure (!) 150/72, pulse 81, temperature 97.8 F (36.6 C), temperature source Oral, resp. rate 18, height 5\' 2"  (1.575 m), weight 166 lb 12.8 oz (75.7 kg), SpO2 98 %.    Lymphatics: No palpable cervical, supraclavicular, axillary or inguinal lymph nodes. Resp: Lungs clear bilaterally. Cardio: Regular rate and rhythm. GI: No hepatosplenomegaly. Vascular: Trace bilateral lower leg edema. Rectal: Small hemorrhoid/skin tags at the anal verge.  Anal canal without mass.  Smooth anal stricture, could not pass.    Lab Results:  Lab Results  Component Value Date   WBC 5.2 09/24/2021   HGB 10.0 (L) 09/24/2021   HCT 30.4 (L) 09/24/2021   MCV 95.9 09/24/2021   PLT 165 09/24/2021   NEUTROABS 13.4 (H) 09/20/2021    Imaging:  No results found.  Medications: I have reviewed the patient's current medications.  Assessment/Plan: Poorly differentiated carcinoma, basaloid squamous cell carcinoma, of the anal canal and posterior vagina. Staging CT scans of the abdomen and pelvis on 11/15/2011 with suspicious perirectal lymph node and no evidence of distant metastatic disease. She began radiation 11/29/2011. She completed cycle 1 mitomycin and 5 fluorouracil 11/29/2011. She completed cycle 2 beginning 12/27/2011. She completed the course of radiation on 01/05/2012. Rectal bleeding secondary to #1. Resolved. Erythema and desquamation related to radiation. Resolved. Anemia. Resolved. Status post placement right upper the PICC line 11/29/2011. The PICC line has been removed. Depression.   Status post  evaluation in the emergency Department on 02/09/2012 for shortness of breath. Chest CT was negative for evidence of a pulmonary embolism or other acute finding. She has had no further shortness of breath. Rectal stenosis. She is status post an exam under anesthesia with dilatation of the anal canal and biopsies of the posterior vaginal wall and anterior anal wall canal with pathology negative for evidence of malignancy 06/21/2012. Persistent rectal stenosis. She has been evaluated by Dr. Dalbert Batman Hemorrhoids Colonoscopy 08/11/2016-anal stenosis.  Internal hemorrhoids.  Repeat colonoscopy in 3 years for surveillance.  Colonoscopy 12/12/2019-congested and scarred mucosa in the rectum.  Stenosis found on perianal exam.  Repeat colonoscopy 5 years for surveillance.  Disposition: [name] remains in clinical remission from anal cancer.  She is now 10 years out from the initial diagnosis.  She would like to continue follow-up at the [facility].  She will return for an office visit in 1 year.    Ned Card ANP/GNP-BC   11/26/2021  10:04 AM

## 2021-11-30 ENCOUNTER — Other Ambulatory Visit: Payer: Self-pay | Admitting: Physical Medicine and Rehabilitation

## 2021-12-03 ENCOUNTER — Encounter: Payer: Self-pay | Admitting: Physical Medicine and Rehabilitation

## 2021-12-03 ENCOUNTER — Ambulatory Visit: Payer: Medicare PPO | Admitting: Physical Medicine and Rehabilitation

## 2021-12-03 ENCOUNTER — Other Ambulatory Visit: Payer: Self-pay

## 2021-12-03 VITALS — BP 131/80 | HR 92

## 2021-12-03 DIAGNOSIS — M5126 Other intervertebral disc displacement, lumbar region: Secondary | ICD-10-CM

## 2021-12-03 DIAGNOSIS — M1711 Unilateral primary osteoarthritis, right knee: Secondary | ICD-10-CM | POA: Diagnosis not present

## 2021-12-03 DIAGNOSIS — M48062 Spinal stenosis, lumbar region with neurogenic claudication: Secondary | ICD-10-CM

## 2021-12-03 DIAGNOSIS — M5416 Radiculopathy, lumbar region: Secondary | ICD-10-CM

## 2021-12-03 NOTE — Progress Notes (Signed)
[name] - 76 y.o. female MRN [id number]  Date of birth: 09/29/1946  Office Visit Note: Visit Date: 12/03/2021 PCP: Tiffany Stains, MD Referred by: Tiffany Stains, MD  Subjective: Chief Complaint  Patient presents with   Lower Back - Pain   Right Knee - Pain   HPI: [name] is a 76 y.o. female who comes in today for evaluation of chronic right sided lower back pain radiating to buttock and anterior thigh. Patient reports pain is exacerbated by walking and activity, describes as sharp and shooting sensation, currently rates as 4 out of 10. Patient reports some relief of pain with home exercise regimen, use of heat/ice, rest and medications. Patient states she does have in-home physical therapy multiple times a week. Patient's lumbar MRI from September of 2022 exhibits multi-level facet arthropathy, new right foraminal disc protrusion at L3-L4, moderate spinal stenosis noted at L3-L4 and L5-S1. No high grade spinal canal stenosis noted. Patient has history of L1 compression fracture and has been followed by Dr. Basil Dess in the past. Patient had right L3 transforaminal epidural steroid injection on 11/10/2021 by Dr. Magnus Sinning and reports greater than 50% relief of pain with this procedure.   Patient did also mention continued swelling and pain to right knee. Patient states symptoms have been ongoing since right total knee replacement by Dr. Alphonzo Severance in August of 2022. Patient states she is currently able to manage these symptoms with pain medication and continued in-home PT.   Patient had recent follow up with [facility] and remains in clinical remission from anal cancer.     Patient denies focal weakness, numbness and tingling. Patient denies recent trauma or falls.    Review of Systems  Musculoskeletal:  Positive for back pain.  Neurological:  Negative for tingling, sensory change, focal weakness and weakness.  All other systems reviewed  and are negative. Otherwise per HPI.  Assessment & Plan: Visit Diagnoses:    ICD-10-CM   1. Lumbar radiculopathy  M54.16     2. Spinal stenosis of lumbar region with neurogenic claudication  M48.062     3. Other intervertebral disc displacement, lumbar region  M51.26     4. Arthritis of right knee  M17.11        Plan: Findings:  Chronic bilateral lower back pain radiating to buttock and anterior thigh. Significant and sustained pain from recent right L3 transforaminal epidural steroid injection on 11/10/2021. Patient continues with in-home PT, use of heat/ice and medications to manage pain at home. We feel the next step is to continue to monitor patient. Patient encouraged to contact her primary care provider Dr. Harlan [name] or pain management specialist if she requires long term chronic pain management. Patient instructed to follow up with [place] as needed. If her pain relief is short term we would consider having her follow back up with Dr. Basil Dess to discuss further treatment options.   Patient encouraged to follow up with Dr. Alphonzo Severance if she continues to have issues with right knee.    Meds & Orders: No orders of the defined types were placed in this encounter.  No orders of the defined types were placed in this encounter.   Follow-up: Return if symptoms worsen or fail to improve.   Procedures: No procedures performed      Clinical History: [facility]  [address] [place], [place] [address] Tel: [phone] Fax:  [phone] Test Date:  01/09/2020  [name] DOB: 1946-05-30 Physician: Narda Amber, DO Sex: Female Height: 5\' 5"  Ref Phys: Narda Amber, DO ID#: [id number] Temp: 32.0C Technician:     Patient Complaints: This is a 76 year old female referred for evaluation of bilateral feet and leg numbness and tingling.   NCV & EMG Findings: Extensive electrodiagnostic testing of the right lower extremity and additional studies  of the left shows:  1. Bilateral sural and superficial peroneal sensory responses are within normal limits. 2. Bilateral peroneal motor responses are within normal limits.  Bilateral tibial motor responses show reduced amplitude (R2.9, L2.5 mV).   3. Bilateral H reflex studies are absent.   4. Chronic motor axonal loss changes are seen affecting the S1 myotome bilaterally, without accompanied active denervation.     Impression: 1. Chronic S1 radiculopathy affecting bilateral lower extremities, moderate. 2. There is no evidence of a large fiber sensorimotor polyneuropathy affecting the lower extremities.    --- EXAM: MRI LUMBAR SPINE WITHOUT CONTRAST   TECHNIQUE: Multiplanar, multisequence MR imaging of the lumbar spine was performed. No intravenous contrast was administered.   COMPARISON:  09/07/2019   FINDINGS: Segmentation: Standard.   Alignment: Unchanged trace anterolisthesis of L4 on L5. 5 mm anterolisthesis of L5 on S1, mildly increased and facet mediated.   Vertebrae: No acute fracture, suspicious marrow lesion, or significant marrow edema. Unchanged chronic L1, L3, and L5 compression fractures. Unchanged T11 superior endplate Schmorl's node. Scattered small vertebral hemangiomas.   Conus medullaris and cauda equina: Conus extends to the L1 level. Conus and cauda equina appear normal.   Paraspinal and other soft tissues: Unchanged subcentimeter T2 hyperintensity in the lower pole of the right kidney, likely a cyst.   Disc levels:   Disc desiccation throughout the lumbar spine. Mild disc space narrowing at L3-4 and L5-S1.   T12-L1: Mild disc bulging and mild facet arthrosis without stenosis, unchanged.   L1-2: Stable to slightly increased disc bulging. Mild facet hypertrophy. No significant stenosis.   L2-3: Disc bulging, endplate spurring, and moderate facet hypertrophy result in mild spinal stenosis and mild bilateral neural foraminal stenosis, unchanged.    L3-4: Disc bulging and severe facet and ligamentum flavum hypertrophy have progressed, and there is a new right foraminal disc protrusion. These findings result in moderate spinal stenosis and severe right and moderate left neural foraminal stenosis. Likely right L3 nerve root impingement.   L4-5: Disc bulging and severe facet hypertrophy result in mild bilateral lateral recess stenosis, borderline spinal stenosis, and borderline bilateral neural foraminal stenosis, unchanged. Bilateral facet ankylosis.   L5-S1: Anterolisthesis with bulging uncovered disc, a central disc extrusion, and severe facet hypertrophy result in moderate spinal stenosis, moderate bilateral lateral recess stenosis, and moderate bilateral neural foraminal stenosis, progressed from prior.   IMPRESSION: 1. Progressive disc and facet degeneration at L3-4 with a new right foraminal disc protrusion. Severe right neural foraminal stenosis and moderate spinal stenosis. 2. Progressive, moderate spinal stenosis and moderate bilateral neural foraminal stenosis at L5-S1.     Electronically Signed   By: Logan Bores M.D.   On: 07/13/2021 16:54   She reports that she quit smoking about 34 years ago. Her smoking use included cigarettes. She has a 12.50 pack-year smoking history. She has never used smokeless tobacco. No results for input(s): HGBA1C, LABURIC in the last 8760 hours.  Objective:  VS:  HT:     WT:    BMI:      BP:131/80   HR:92bpm   TEMP: ( )  RESP:  Physical Exam Vitals and nursing note reviewed.  HENT:     Head: Normocephalic and atraumatic.     Right Ear: External ear normal.     Left Ear: External ear normal.     Nose: Nose normal.     Mouth/Throat:     Mouth: Mucous membranes are moist.  Eyes:     Extraocular Movements: Extraocular movements intact.  Cardiovascular:     Rate and Rhythm: Normal rate.     Pulses: Normal pulses.  Pulmonary:     Effort: Pulmonary effort is normal.  Abdominal:      General: Abdomen is flat. There is no distension.  Musculoskeletal:        General: Tenderness present.     Cervical back: Normal range of motion.     Comments: Pt is slow to rise from seated position to standing. Good lumbar range of motion. Strong distal strength without clonus, no pain upon palpation of greater trochanters. Sensation intact bilaterally. Dysesthesias noted to right L3 dermatome. Walks independently, gait steady.   Skin:    General: Skin is warm and dry.     Capillary Refill: Capillary refill takes less than 2 seconds.  Neurological:     General: No focal deficit present.     Mental Status: She is alert and oriented to person, place, and time.  Psychiatric:        Mood and Affect: Mood normal.        Behavior: Behavior normal.    Ortho Exam  Imaging: No results found.  Past Medical/Family/Surgical/Social History: Medications & Allergies reviewed per EMR, new medications updated. Patient Active Problem List   Diagnosis Date Noted   E coli bacteremia    Acute lower UTI    Sepsis due to undetermined organism ([place]) 09/19/2021   Hypokalemia 09/19/2021   Leukopenia 09/19/2021   Prolonged QT interval 09/19/2021   Arthritis of right knee    UTI (urinary tract infection) 06/26/2021   Severe sepsis ([place]) 38/75/6433   Acute metabolic encephalopathy 29/51/8841   Hypothyroid 06/25/2021   S/P total knee arthroplasty, right 06/09/2021   Fever    Staphylococcus aureus bacteremia with sepsis ([place])    SIRS (systemic inflammatory response syndrome) ([place]) 11/30/2020   Lumbar radiculopathy 10/10/2019   Bilateral stenosis of lateral recess of lumbar spine 10/10/2019   Hemorrhoids, external without complications 66/04/3015   Depression    Anxiety    Rectal bleeding    History of radiation therapy    Malignant neoplasm of anal canal ([place]) 11/15/2011   Hemorrhoids, internal, with bleeding 09/07/2011   Acquired anal stenosis 09/07/2011   Hyperlipidemia 09/07/2011   Family  history of breast cancer in sister 09/07/2011   Past Medical History:  Diagnosis Date   anal ca dx'd 11/2011   squamous cell carcinoma   Anxiety    Arthritis 2022   Depression    Full dentures    Hemorrhoids    external   History of radiation therapy 11/29/11 to 01/05/12   anal canal 5040 cGy 28 sessions, regional lymph nodes 4200 cGy 28 sessions   Hyperlipidemia    Lumbar radiculopathy 10/10/2019   Rectal bleeding    Staphylococcus aureus bacteremia with sepsis ([facility])    UTI (urinary tract infection) 06/26/2021   Vitamin D deficiency    Wears glasses    Family History  Problem Relation Age of Onset   Cancer Sister        breast/ age 41   Cervical cancer Sister  77   Alcohol abuse Maternal Uncle    Past Surgical History:  Procedure Laterality Date   ABDOMINAL HYSTERECTOMY  1978   age 29   BUBBLE STUDY  12/04/2020   Procedure: BUBBLE STUDY;  Surgeon: [name], MD;  Location: [facility];  Service: Cardiology;;   EXAMINATION UNDER ANESTHESIA  06/21/2012   Procedure: EXAM UNDER ANESTHESIA;  Surgeon: [name], MD;  Location: [facility];  Service: General;  Laterality: N/A;  rectal exam under anesthesia, anal dilation, rectal biopsy   [place]   Patient had to guess on the date.   RECTAL BIOPSY  06/21/2012   Procedure: BIOPSY RECTAL;  Surgeon: [name], MD;  Location: [facility];  Service: General;  Laterality: N/A;   TEE WITHOUT CARDIOVERSION N/A 12/04/2020   Procedure: TRANSESOPHAGEAL ECHOCARDIOGRAM (TEE);  Surgeon: [name], MD;  Location: [facility];  Service: Cardiology;  Laterality: N/A;   TOTAL KNEE ARTHROPLASTY Right 06/09/2021   Procedure: RIGHT TOTAL KNEE ARTHROPLASTY;  Surgeon: Meredith Pel, MD;  Location: [facility];  Service: Orthopedics;  Laterality: Right;   TUBAL LIGATION     b/l  age 32   Social History   Occupational History   Not on file  Tobacco Use   Smoking status:  Former    Packs/day: 0.50    Years: 25.00    Pack years: 12.50    Types: Cigarettes    Quit date: 09/07/1987    Years since quitting: 34.2   Smokeless tobacco: Never  Vaping Use   Vaping Use: Never used  Substance and Sexual Activity   Alcohol use: No   Drug use: No   Sexual activity: Not Currently

## 2021-12-03 NOTE — Procedures (Signed)
Lumbosacral Transforaminal Epidural Steroid Injection - Sub-Pedicular Approach with Fluoroscopic Guidance  [name]      Date of Birth: 1946/05/23 MRN: [id number] PCP: [name], MD      Visit Date: 11/10/2021   Universal Protocol:    Date/Time: 11/10/2021  Consent Given By: the patient  Position: PRONE  Additional Comments: Vital signs were monitored before and after the procedure. Patient was prepped and draped in the usual sterile fashion. The correct patient, procedure, and site was verified.   Injection Procedure Details:   Procedure diagnoses: Lumbar radiculopathy [M54.16]    Meds Administered:  Meds ordered this encounter  Medications   methylPREDNISolone acetate (DEPO-MEDROL) injection 80 mg    Laterality: Right  Location/Site: L3  Needle:5.0 in., 22 ga.  Short bevel or Quincke spinal needle  Needle Placement: Transforaminal  Findings:    -Comments: Excellent flow of contrast along the nerve, nerve root and into the epidural space.  Procedure Details: After squaring off the end-plates to get a true AP view, the C-arm was positioned so that an oblique view of the foramen as noted above was visualized. The target area is just inferior to the "nose of the scotty dog" or sub pedicular. The soft tissues overlying this structure were infiltrated with 2-3 ml. of 1% Lidocaine without Epinephrine.  The spinal needle was inserted toward the target using a "trajectory" view along the fluoroscope beam.  Under AP and lateral visualization, the needle was advanced so it did not puncture dura and was located close the 6 O'Clock position of the pedical in AP tracterory. Biplanar projections were used to confirm position. Aspiration was confirmed to be negative for CSF and/or blood. A 1-2 ml. volume of Isovue-250 was injected and flow of contrast was noted at each level. Radiographs were obtained for documentation purposes.   After attaining the desired flow  of contrast documented above, a 0.5 to 1.0 ml test dose of 0.25% Marcaine was injected into each respective transforaminal space.  The patient was observed for 90 seconds post injection.  After no sensory deficits were reported, and normal lower extremity motor function was noted,   the above injectate was administered so that equal amounts of the injectate were placed at each foramen (level) into the transforaminal epidural space.   Additional Comments:  The patient tolerated the procedure well Dressing: 2 x 2 sterile gauze and Band-Aid    Post-procedure details: Patient was observed during the procedure. Post-procedure instructions were reviewed.  Patient left the clinic in stable condition.

## 2021-12-03 NOTE — Progress Notes (Signed)
Pt state lower back pain that travels down to her right knee. Pt state walking makes the pain worse. Pt state he takes pain meds to help ease her pain. Pt has hx of inj on 11/10/2021 pt state it help a lot.  Numeric Pain Rating Scale and Functional Assessment Average Pain 9 Pain Right Now 5 My pain is intermittent, tingling, and aching Pain is worse with: walking and some activites Pain improves with: medication and injections   In the last MONTH (on 0-10 scale) has pain interfered with the following?  1. General activity like being  able to carry out your everyday physical activities such as walking, climbing stairs, carrying groceries, or moving a chair?  Rating(7)  2. Relation with others like being able to carry out your usual social activities and roles such as  activities at home, at work and in your community. Rating(8)  3. Enjoyment of life such that you have  been bothered by emotional problems such as feeling anxious, depressed or irritable?  Rating(9)

## 2021-12-03 NOTE — Progress Notes (Signed)
[name] [id number]  Date of birth: 1946-09-19  Office Visit Note: Visit Date: 11/10/2021 PCP: [name], MD Referred by: [name], MD  Subjective: Chief Complaint  Patient presents with   Lower Back - Pain   HPI:  [name] is a 76 y.o. [name] who comes in today at the request of Dr. Basil Dess for planned Right L3-4 Lumbar Transforaminal epidural steroid injection with fluoroscopic guidance.  The patient has failed conservative care including home exercise, medications, time and activity modification.  This injection will be diagnostic and hopefully therapeutic.  Please see requesting physician notes for further details and justification. MRI reviewed with images and spine model.  MRI reviewed in the note below.  ROS Otherwise per HPI.  Assessment & Plan: Visit Diagnoses:    ICD-10-CM   1. Lumbar radiculopathy  M54.16 XR C-ARM NO REPORT    Epidural Steroid injection    methylPREDNISolone acetate (DEPO-MEDROL) injection 80 mg      Plan: No additional findings.   Meds & Orders:  Meds ordered this encounter  Medications   methylPREDNISolone acetate (DEPO-MEDROL) injection 80 mg    Orders Placed This Encounter  Procedures   XR C-ARM NO REPORT   Epidural Steroid injection    Follow-up: Return in about 2 weeks (around 11/24/2021).   Procedures: No procedures performed  Lumbosacral Transforaminal Epidural Steroid Injection - Sub-Pedicular Approach with Fluoroscopic Guidance  [name]      Date of Birth: May 16, 1946 MRN: [id number] PCP: [name], MD      Visit Date: 11/10/2021   Universal Protocol:    Date/Time: 11/10/2021  Consent Given By: the patient  Position: PRONE  Additional Comments: Vital signs were monitored before and after the procedure. Patient was prepped and draped in the usual sterile fashion. The correct patient, procedure, and site was verified.   Injection Procedure Details:    Procedure diagnoses: Lumbar radiculopathy [M54.16]    Meds Administered:  Meds ordered this encounter  Medications   methylPREDNISolone acetate (DEPO-MEDROL) injection 80 mg    Laterality: Right  Location/Site: L3  Needle:5.0 in., 22 ga.  Short bevel or Quincke spinal needle  Needle Placement: Transforaminal  Findings:    -Comments: Excellent flow of contrast along the nerve, nerve root and into the epidural space.  Procedure Details: After squaring off the end-plates to get a true AP view, the C-arm was positioned so that an oblique view of the foramen as noted above was visualized. The target area is just inferior to the "nose of the scotty dog" or sub pedicular. The soft tissues overlying this structure were infiltrated with 2-3 ml. of 1% Lidocaine without Epinephrine.  The spinal needle was inserted toward the target using a "trajectory" view along the fluoroscope beam.  Under AP and lateral visualization, the needle was advanced so it did not puncture dura and was located close the 6 O'Clock position of the pedical in AP tracterory. Biplanar projections were used to confirm position. Aspiration was confirmed to be negative for CSF and/or blood. A 1-2 ml. volume of Isovue-250 was injected and flow of contrast was noted at each level. Radiographs were obtained for documentation purposes.   After attaining the desired flow of contrast documented above, a 0.5 to 1.0 ml test dose of 0.25% Marcaine was injected into each respective transforaminal space.  The patient was observed for 90 seconds post injection.  After no sensory deficits were reported, and normal lower extremity  motor function was noted,   the above injectate was administered so that equal amounts of the injectate were placed at each foramen (level) into the transforaminal epidural space.   Additional Comments:  The patient tolerated the procedure well Dressing: 2 x 2 sterile gauze and Band-Aid    Post-procedure  details: Patient was observed during the procedure. Post-procedure instructions were reviewed.  Patient left the clinic in stable condition.     Clinical History: [name]  [place] [place], [place], [place] [address] Tel: [phone] Fax:  [phone] Test Date:  01/09/2020   [name] DOB: 1945/12/29 Physician: Narda Amber, DO Sex: [name] Height: 5\' 5"  Ref Phys: Narda Amber, DO ID#: [id number] Temp: 32.0C Technician:     Patient Complaints: This is a 76 year old [name] referred for evaluation of bilateral feet and leg numbness and tingling.   NCV & EMG Findings: Extensive electrodiagnostic testing of the right lower extremity and additional studies of the left shows:  1. Bilateral sural and superficial peroneal sensory responses are within normal limits. 2. Bilateral peroneal motor responses are within normal limits.  Bilateral tibial motor responses show reduced amplitude (R2.9, L2.5 mV).   3. Bilateral H reflex studies are absent.   4. Chronic motor axonal loss changes are seen affecting the S1 myotome bilaterally, without accompanied active denervation.     Impression: 1. Chronic S1 radiculopathy affecting bilateral lower extremities, moderate. 2. There is no evidence of a large fiber sensorimotor polyneuropathy affecting the lower extremities.    --- EXAM: MRI LUMBAR SPINE WITHOUT CONTRAST   TECHNIQUE: Multiplanar, multisequence MR imaging of the lumbar spine was performed. No intravenous contrast was administered.   COMPARISON:  09/07/2019   FINDINGS: Segmentation: Standard.   Alignment: Unchanged trace anterolisthesis of L4 on L5. 5 mm anterolisthesis of L5 on S1, mildly increased and facet mediated.   Vertebrae: No acute fracture, suspicious marrow lesion, or significant marrow edema. Unchanged chronic L1, L3, and L5 compression fractures. Unchanged T11 superior endplate Schmorl's node. Scattered small vertebral  hemangiomas.   Conus medullaris and cauda equina: Conus extends to the L1 level. Conus and cauda equina appear normal.   Paraspinal and other soft tissues: Unchanged subcentimeter T2 hyperintensity in the lower pole of the right kidney, likely a cyst.   Disc levels:   Disc desiccation throughout the lumbar spine. Mild disc space narrowing at L3-4 and L5-S1.   T12-L1: Mild disc bulging and mild facet arthrosis without stenosis, unchanged.   L1-2: Stable to slightly increased disc bulging. Mild facet hypertrophy. No significant stenosis.   L2-3: Disc bulging, endplate spurring, and moderate facet hypertrophy result in mild spinal stenosis and mild bilateral neural foraminal stenosis, unchanged.   L3-4: Disc bulging and severe facet and ligamentum flavum hypertrophy have progressed, and there is a new right foraminal disc protrusion. These findings result in moderate spinal stenosis and severe right and moderate left neural foraminal stenosis. Likely right L3 nerve root impingement.   L4-5: Disc bulging and severe facet hypertrophy result in mild bilateral lateral recess stenosis, borderline spinal stenosis, and borderline bilateral neural foraminal stenosis, unchanged. Bilateral facet ankylosis.   L5-S1: Anterolisthesis with bulging uncovered disc, a central disc extrusion, and severe facet hypertrophy result in moderate spinal stenosis, moderate bilateral lateral recess stenosis, and moderate bilateral neural foraminal stenosis, progressed from prior.   IMPRESSION: 1. Progressive disc and facet degeneration at L3-4 with a new right foraminal disc protrusion. Severe right neural foraminal stenosis and moderate spinal stenosis.  2. Progressive, moderate spinal stenosis and moderate bilateral neural foraminal stenosis at L5-S1.     Electronically Signed   By: Logan Bores M.D.   On: 07/13/2021 16:54     Objective:  VS:  HT:     WT:    BMI:      BP:(!) 150/83   HR:83bpm    TEMP: ( )   RESP:  Physical Exam Vitals and nursing note reviewed.  Constitutional:      General: She is not in acute distress.    Appearance: Normal appearance. She is not ill-appearing.  HENT:     Head: Normocephalic and atraumatic.     Right Ear: External ear normal.     Left Ear: External ear normal.  Eyes:     Extraocular Movements: Extraocular movements intact.  Cardiovascular:     Rate and Rhythm: Normal rate.     Pulses: Normal pulses.  Pulmonary:     Effort: Pulmonary effort is normal. No respiratory distress.  Abdominal:     General: There is no distension.     Palpations: Abdomen is soft.  Musculoskeletal:        General: Tenderness present.     Cervical back: Neck supple.     Right lower leg: No edema.     Left lower leg: No edema.     Comments: Patient has good distal strength with no pain over the greater trochanters.  No clonus or focal weakness.  Skin:    Findings: No erythema, lesion or rash.  Neurological:     General: No focal deficit present.     Mental Status: She is alert and oriented to person, place, and time.     Sensory: No sensory deficit.     Motor: No weakness or abnormal muscle tone.     Coordination: Coordination normal.  Psychiatric:        Mood and Affect: Mood normal.        Behavior: Behavior normal.     Imaging: No results found.

## 2021-12-14 DIAGNOSIS — H2513 Age-related nuclear cataract, bilateral: Secondary | ICD-10-CM | POA: Diagnosis not present

## 2021-12-15 DIAGNOSIS — H269 Unspecified cataract: Secondary | ICD-10-CM | POA: Diagnosis not present

## 2021-12-15 DIAGNOSIS — E039 Hypothyroidism, unspecified: Secondary | ICD-10-CM | POA: Diagnosis not present

## 2021-12-15 DIAGNOSIS — E785 Hyperlipidemia, unspecified: Secondary | ICD-10-CM | POA: Diagnosis not present

## 2021-12-15 DIAGNOSIS — F411 Generalized anxiety disorder: Secondary | ICD-10-CM | POA: Diagnosis not present

## 2021-12-15 DIAGNOSIS — G8929 Other chronic pain: Secondary | ICD-10-CM | POA: Diagnosis not present

## 2021-12-15 DIAGNOSIS — E669 Obesity, unspecified: Secondary | ICD-10-CM | POA: Diagnosis not present

## 2021-12-15 DIAGNOSIS — F331 Major depressive disorder, recurrent, moderate: Secondary | ICD-10-CM | POA: Diagnosis not present

## 2021-12-15 DIAGNOSIS — F439 Reaction to severe stress, unspecified: Secondary | ICD-10-CM | POA: Diagnosis not present

## 2021-12-15 DIAGNOSIS — F329 Major depressive disorder, single episode, unspecified: Secondary | ICD-10-CM | POA: Diagnosis not present

## 2021-12-15 DIAGNOSIS — G629 Polyneuropathy, unspecified: Secondary | ICD-10-CM | POA: Diagnosis not present

## 2021-12-15 DIAGNOSIS — F209 Schizophrenia, unspecified: Secondary | ICD-10-CM | POA: Diagnosis not present

## 2021-12-17 DIAGNOSIS — R030 Elevated blood-pressure reading, without diagnosis of hypertension: Secondary | ICD-10-CM | POA: Diagnosis not present

## 2021-12-17 DIAGNOSIS — M2041 Other hammer toe(s) (acquired), right foot: Secondary | ICD-10-CM | POA: Diagnosis not present

## 2021-12-22 DIAGNOSIS — M79671 Pain in right foot: Secondary | ICD-10-CM | POA: Diagnosis not present

## 2021-12-22 DIAGNOSIS — M2012 Hallux valgus (acquired), left foot: Secondary | ICD-10-CM | POA: Diagnosis not present

## 2021-12-22 DIAGNOSIS — M2011 Hallux valgus (acquired), right foot: Secondary | ICD-10-CM | POA: Diagnosis not present

## 2021-12-22 DIAGNOSIS — M79672 Pain in left foot: Secondary | ICD-10-CM | POA: Diagnosis not present

## 2022-01-13 ENCOUNTER — Other Ambulatory Visit: Payer: Self-pay

## 2022-01-13 ENCOUNTER — Encounter (HOSPITAL_BASED_OUTPATIENT_CLINIC_OR_DEPARTMENT_OTHER): Payer: Medicare PPO | Attending: General Surgery | Admitting: General Surgery

## 2022-01-13 DIAGNOSIS — L84 Corns and callosities: Secondary | ICD-10-CM | POA: Diagnosis not present

## 2022-01-13 DIAGNOSIS — Z09 Encounter for follow-up examination after completed treatment for conditions other than malignant neoplasm: Secondary | ICD-10-CM | POA: Diagnosis not present

## 2022-01-13 DIAGNOSIS — L97518 Non-pressure chronic ulcer of other part of right foot with other specified severity: Secondary | ICD-10-CM | POA: Diagnosis not present

## 2022-01-15 NOTE — Progress Notes (Signed)
IQRA, ROTUNDO ([id number]) ?Visit Report for 01/13/2022 ?Allergy List Details ?Patient Name: Date of Service: ?[name], [name] 01/13/2022 1:15 PM ?Medical Record Number: [id number] ?Patient Account Number: [id number] ?Date of Birth/Sex: Treating RN: ?[birth year] (76 y.o. [name], [name] ?Primary Care Bladimir Auman: [name] Other Clinician: ?Referring Veryl Winemiller: ?Treating Aithana Kushner/Extender: Fredirick Maudlin ?[name] ?Weeks in Treatment: 0 ?Allergies ?Active Allergies ?No Known Allergies ?Allergy Notes ?Electronic Signature(s) ?Signed: 01/15/2022 12:57:05 PM By: Rhae Hammock RN ?Entered By: Rhae Hammock on 01/13/2022 13:10:19 ?-------------------------------------------------------------------------------- ?Arrival Information Details ?Patient Name: Date of Service: ?[name], [name] 01/13/2022 1:15 PM ?Medical Record Number: [id number] ?Patient Account Number: [id number] ?Date of Birth/Sex: Treating RN: ?26-Jan-1946 (76 y.o. [name], [name] ?Primary Care Dannie Hattabaugh: [name] Other Clinician: ?Referring Asiah Browder: ?Treating Sean Malinowski/Extender: Fredirick Maudlin ?[name] ?Weeks in Treatment: 0 ?Visit Information ?Patient Arrived: Ambulatory ?Arrival Time: 13:08 ?Accompanied By: self ?Transfer Assistance: None ?Patient Identification Verified: Yes ?Secondary Verification Process Completed: Yes ?Patient Requires Transmission-Based Precautions: No ?Patient Has Alerts: No ?Electronic Signature(s) ?Signed: 01/15/2022 12:57:05 PM By: Rhae Hammock RN ?Entered By: Rhae Hammock on 01/13/2022 13:08:54 ?-------------------------------------------------------------------------------- ?Clinic Level of Care Assessment Details ?Patient Name: Date of Service: ?[name], [name] 01/13/2022 1:15 PM ?Medical Record Number: [id number] ?Patient Account Number: [id number] ?Date of Birth/Sex: Treating RN: ?May 17, 1946 (76 y.o. [name], [name] ?Primary Care Taiquan Campanaro: [name] Other  Clinician: ?Referring Ricarda Atayde: ?Treating Himmat Enberg/Extender: Fredirick Maudlin ?[name] ?Weeks in Treatment: 0 ?Clinic Level of Care Assessment Items ?TOOL 4 Quantity Score ?X- 1 0 ?Use when only an EandM is performed on FOLLOW-UP visit ?ASSESSMENTS - Nursing Assessment / Reassessment ?X- 1 10 ?Reassessment of Co-morbidities (includes updates in patient status) ?X- 1 5 ?Reassessment of Adherence to Treatment Plan ?ASSESSMENTS - Wound and Skin A ssessment / Reassessment ?'[]'$  - 0 ?Simple Wound Assessment / Reassessment - one wound ?'[]'$  - 0 ?Complex Wound Assessment / Reassessment - multiple wounds ?'[]'$  - 0 ?Dermatologic / Skin Assessment (not related to wound area) ?ASSESSMENTS - Focused Assessment ?'[]'$  - 0 ?Circumferential Edema Measurements - multi extremities ?'[]'$  - 0 ?Nutritional Assessment / Counseling / Intervention ?'[]'$  - 0 ?Lower Extremity Assessment (monofilament, tuning fork, pulses) ?'[]'$  - 0 ?Peripheral Arterial Disease Assessment (using hand held doppler) ?ASSESSMENTS - Ostomy and/or Continence Assessment and Care ?'[]'$  - 0 ?Incontinence Assessment and Management ?'[]'$  - 0 ?Ostomy Care Assessment and Management (repouching, etc.) ?PROCESS - Coordination of Care ?'[]'$  - 0 ?Simple Patient / Family Education for ongoing care ?'[]'$  - 0 ?Complex (extensive) Patient / Family Education for ongoing care ?X- 1 10 ?Staff obtains Consents, Records, T Results / Process Orders ?est ?'[]'$  - 0 ?Staff telephones HHA, Nursing Homes / Clarify orders / etc ?'[]'$  - 0 ?Routine Transfer to another Facility (non-emergent condition) ?'[]'$  - 0 ?Routine Hospital Admission (non-emergent condition) ?X- 1 15 ?New Admissions / Biomedical engineer / Ordering NPWT Apligraf, etc. ?, ?'[]'$  - 0 ?Emergency Hospital Admission (emergent condition) ?X- 1 10 ?Simple Discharge Coordination ?'[]'$  - 0 ?Complex (extensive) Discharge Coordination ?PROCESS - Special Needs ?'[]'$  - 0 ?Pediatric / Minor Patient Management ?'[]'$  - 0 ?Isolation Patient Management ?'[]'$  -  0 ?Hearing / Language / Visual special needs ?'[]'$  - 0 ?Assessment of Community assistance (transportation, D/C planning, etc.) ?'[]'$  - 0 ?Additional assistance / Altered mentation ?'[]'$  - 0 ?Support Surface(s) Assessment (bed, cushion, seat, etc.) ?INTERVENTIONS - Wound Cleansing / Measurement ?'[]'$  - 0 ?Simple Wound Cleansing - one wound ?'[]'$  - 0 ?Complex Wound Cleansing - multiple wounds ?'[]'$  - 0 ?Wound  Imaging (photographs - any number of wounds) ?'[]'$  - 0 ?Wound Tracing (instead of photographs) ?'[]'$  - 0 ?Simple Wound Measurement - one wound ?'[]'$  - 0 ?Complex Wound Measurement - multiple wounds ?INTERVENTIONS - Wound Dressings ?'[]'$  - 0 ?Small Wound Dressing one or multiple wounds ?'[]'$  - 0 ?Medium Wound Dressing one or multiple wounds ?'[]'$  - 0 ?Large Wound Dressing one or multiple wounds ?'[]'$  - 0 ?Application of Medications - topical ?'[]'$  - 0 ?Application of Medications - injection ?INTERVENTIONS - Miscellaneous ?'[]'$  - 0 ?External ear exam ?'[]'$  - 0 ?Specimen Collection (cultures, biopsies, blood, body fluids, etc.) ?'[]'$  - 0 ?Specimen(s) / Culture(s) sent or taken to Lab for analysis ?'[]'$  - 0 ?Patient Transfer (multiple staff / Civil Service fast streamer / Similar devices) ?'[]'$  - 0 ?Simple Staple / Suture removal (25 or less) ?'[]'$  - 0 ?Complex Staple / Suture removal (26 or more) ?'[]'$  - 0 ?Hypo / Hyperglycemic Management (close monitor of Blood Glucose) ?'[]'$  - 0 ?Ankle / Brachial Index (ABI) - do not check if billed separately ?X- 1 5 ?Vital Signs ?Has the patient been seen at the hospital within the last three years: Yes ?Total Score: 55 ?Level Of Care: New/Established - Level 2 ?Electronic Signature(s) ?Signed: 01/15/2022 12:57:05 PM By: Rhae Hammock RN ?Entered By: Rhae Hammock on 01/13/2022 13:47:32 ?-------------------------------------------------------------------------------- ?Encounter Discharge Information Details ?Patient Name: Date of Service: ?[name], [name] 01/13/2022 1:15 PM ?Medical Record Number: [id number] ?Patient Account  Number: [id number] ?Date of Birth/Sex: Treating RN: ?08/28/1946 (76 y.o. [name], [name] ?Primary Care Kao Berkheimer: [name] Other Clinician: ?Referring Earnie Rockhold: ?Treating Samyah Bilbo/Extender: Fredirick Maudlin ?[name] ?Weeks in Treatment: 0 ?Encounter Discharge Information Items ?Discharge Condition: Stable ?Ambulatory Status: Ambulatory ?Discharge Destination: Home ?Transportation: Private Auto ?Accompanied By: Jose Persia ?Schedule Follow-up Appointment: Yes ?Clinical Summary of Care: Patient Declined ?Electronic Signature(s) ?Signed: 01/15/2022 12:57:05 PM By: Rhae Hammock RN ?Entered By: Rhae Hammock on 01/13/2022 13:48:30 ?-------------------------------------------------------------------------------- ?Lower Extremity Assessment Details ?Patient Name: ?Date of Service: ?[name], [name] 01/13/2022 1:15 PM ?Medical Record Number: [id number] ?Patient Account Number: [id number] ?Date of Birth/Sex: ?Treating RN: ?06/02/1946 (76 y.o. [name], [name] ?Primary Care Teegan Guinther: [name] ?Other Clinician: ?Referring Jarrah Babich: ?Treating Kenston Longton/Extender: Fredirick Maudlin ?[name] ?Weeks in Treatment: 0 ?Electronic Signature(s) ?Signed: 01/15/2022 12:57:05 PM By: Rhae Hammock RN ?Entered By: Rhae Hammock on 01/13/2022 13:23:33 ?-------------------------------------------------------------------------------- ?Multi Wound Chart Details ?Patient Name: ?Date of Service: ?[name], [name] 01/13/2022 1:15 PM ?Medical Record Number: [id number] ?Patient Account Number: [id number] ?Date of Birth/Sex: ?Treating RN: ?January 20, 1946 (76 y.o. F) ?Primary Care Adriaan Maltese: [name] ?Other Clinician: ?Referring Caylan Schifano: ?Treating Humna Moorehouse/Extender: Fredirick Maudlin ?[name] ?Weeks in Treatment: 0 ?Vital Signs ?Height(in): ?Pulse(bpm): 75 ?Weight(lbs): ?Blood Pressure(mmHg): 131/69 ?Body Mass Index(BMI): ?Temperature(??F): 98.6 ?Respiratory Rate(breaths/min): 17 ?Wound Assessments ?Treatment  Notes ?Electronic Signature(s) ?Signed: 01/13/2022 1:37:31 PM By: Fredirick Maudlin MD FACS ?Entered By: Fredirick Maudlin on 01/13/2022 13:37:31 ?-------------------------------------------------------------------------------

## 2022-01-15 NOTE — Progress Notes (Signed)
[name], [name] ([id number]) ?Visit Report for 01/13/2022 ?Abuse Risk Screen Details ?Patient Name: Date of Service: ?[name], [name] 01/13/2022 1:15 PM ?Medical Record Number: [id number] ?Patient Account Number: [id number] ?Date of Birth/Sex: Treating RN: ?Jan 03, 1946 (76 y.o. [name], [name] ?Primary Care Austin Herd: [name] Other Clinician: ?Referring Dennys Guin: ?Treating Akshita Italiano/Extender: Fredirick Maudlin ?[name] ?Weeks in Treatment: 0 ?Abuse Risk Screen Items ?Answer ?ABUSE RISK SCREEN: ?Has anyone close to you tried to hurt or harm you recentlyo No ?Do you feel uncomfortable with anyone in your familyo No ?Has anyone forced you do things that you didnt want to doo No ?Electronic Signature(s) ?Signed: 01/15/2022 12:57:05 PM By: Rhae Hammock RN ?Entered By: Rhae Hammock on 01/13/2022 13:14:43 ?-------------------------------------------------------------------------------- ?Activities of Daily Living Details ?Patient Name: Date of Service: ?[name], [name] 01/13/2022 1:15 PM ?Medical Record Number: [id number] ?Patient Account Number: [id number] ?Date of Birth/Sex: Treating RN: ?05-17-1946 (76 y.o. [name], [name] ?Primary Care Ramzey Petrovic: [name] Other Clinician: ?Referring Jakub Debold: ?Treating Oviya Ammar/Extender: Fredirick Maudlin ?[name] ?Weeks in Treatment: 0 ?Activities of Daily Living Items ?Answer ?Activities of Daily Living (Please select one for each item) ?[place] ?T Medications ?ake Completely Able ?Use T elephone Completely Able ?Care for Appearance Completely Able ?Use T oilet Completely Able ?Bath / Shower Completely Able ?Dress Self Completely Able ?Feed Self Completely Able ?Walk Completely Able ?Get In / Out Bed Completely Able ?Housework Completely Able ?Prepare Meals Completely Able ?Handle Money Completely Able ?Shop for Self Completely Able ?Electronic Signature(s) ?Signed: 01/15/2022 12:57:05 PM By: Rhae Hammock RN ?Entered  By: Rhae Hammock on 01/13/2022 13:15:40 ?-------------------------------------------------------------------------------- ?Education Screening Details ?Patient Name: ?Date of Service: ?[name], [name] 01/13/2022 1:15 PM ?Medical Record Number: [id number] ?Patient Account Number: [id number] ?Date of Birth/Sex: ?Treating RN: ?03-22-1946 (76 y.o. [name], [name] ?Primary Care Siobahn Worsley: [name] ?Other Clinician: ?Referring Dierks Wach: ?Treating Jamille Yoshino/Extender: Fredirick Maudlin ?[name] ?Weeks in Treatment: 0 ?Primary Learner Assessed: Patient ?Learning Preferences/Education Level/Primary Language ?Learning Preference: Explanation, Demonstration, Communication Board, Printed Material ?Highest Education Level: High School ?Preferred Language: English ?Cognitive Barrier ?Language Barrier: No ?Translator Needed: No ?Memory Deficit: No ?Emotional Barrier: No ?Cultural/Religious Beliefs Affecting Medical Care: No ?Physical Barrier ?Impaired Vision: Yes Glasses ?Impaired Hearing: No ?Decreased Hand dexterity: No ?Knowledge/Comprehension ?Knowledge Level: High ?Comprehension Level: High ?Ability to understand written instructions: High ?Ability to understand verbal instructions: High ?Motivation ?Anxiety Level: Calm ?Cooperation: Cooperative ?Education Importance: Denies Need ?Interest in Health Problems: Asks Questions ?Perception: Coherent ?Willingness to Engage in Self-Management High ?Activities: ?Readiness to Engage in Self-Management High ?Activities: ?Electronic Signature(s) ?Signed: 01/15/2022 12:57:05 PM By: Rhae Hammock RN ?Entered By: Rhae Hammock on 01/13/2022 13:16:22 ?-------------------------------------------------------------------------------- ?Fall Risk Assessment Details ?Patient Name: ?Date of Service: ?[name], [name] 01/13/2022 1:15 PM ?Medical Record Number: [id number] ?Patient Account Number: [id number] ?Date of Birth/Sex: ?Treating RN: ?12-12-1945 (76 y.o. [name],  [name] ?Primary Care Glenard Keesling: [name] ?Other Clinician: ?Referring Yaffa Seckman: ?Treating Wai Minotti/Extender: Fredirick Maudlin ?[name] ?Weeks in Treatment: 0 ?Fall Risk Assessment Items ?Have you had 2 or more falls in the last 12 monthso 0 Yes ?Have you had any fall that resulted in injury in the last 12 monthso 0 No ?FALLS RISK SCREEN ?History of falling - immediate or within 3 months 0 No ?Secondary diagnosis (Do you have 2 or more medical diagnoseso) 0 No ?Ambulatory aid ?None/bed rest/wheelchair/nurse 0 No ?Crutches/cane/walker 0 No ?Furniture 0 No ?Intravenous therapy Access/Saline/Heparin Lock 0 No ?Gait/Transferring ?Normal/ bed rest/ wheelchair 0 No ?Weak (short steps with  or without shuffle, stooped but able to lift head while walking, may seek 0 No ?support from furniture) ?Impaired (short steps with shuffle, may have difficulty arising from chair, head down, impaired 0 No ?balance) ?Mental Status ?Oriented to own ability 0 No ?Electronic Signature(s) ?Signed: 01/15/2022 12:57:05 PM By: Rhae Hammock RN ?Entered By: Rhae Hammock on 01/13/2022 13:16:47 ?-------------------------------------------------------------------------------- ?Foot Assessment Details ?Patient Name: ?Date of Service: ?[name], [name] 01/13/2022 1:15 PM ?Medical Record Number: [id number] ?Patient Account Number: [id number] ?Date of Birth/Sex: ?Treating RN: ?Mar 17, 1946 (76 y.o. [name], [name] ?Primary Care Gerrod Maule: [name] ?Other Clinician: ?Referring Aubryanna Nesheim: ?Treating Judeth Gilles/Extender: Fredirick Maudlin ?[name] ?Weeks in Treatment: 0 ?Foot Assessment Items ?Site Locations ?+ = Sensation present, - = Sensation absent, C = Callus, U = Ulcer ?R = Redness, W = Warmth, M = Maceration, PU = Pre-ulcerative lesion ?F = Fissure, S = Swelling, D = Dryness ?Assessment ?Right: Left: ?Other Deformity: No No ?Prior Foot Ulcer: No No ?Prior Amputation: No No ?Charcot Joint: No No ?Ambulatory  Status: ?Gait: ?Notes ?N/A no wounds pt. not diabetic!!! ?Electronic Signature(s) ?Signed: 01/15/2022 12:57:05 PM By: Rhae Hammock RN ?Entered By: Rhae Hammock on 01/13/2022 13:23:26 ?-------------------------------------------------------------------------------- ?Nutrition Risk Screening Details ?Patient Name: ?Date of Service: ?[name], [name] 01/13/2022 1:15 PM ?Medical Record Number: [id number] ?Patient Account Number: [id number] ?Date of Birth/Sex: ?Treating RN: ?03-Sep-1946 (76 y.o. [name], [name] ?Primary Care Mariaha Ellington: [name] ?Other Clinician: ?Referring Joshus Rogan: ?Treating Geselle Cardosa/Extender: Fredirick Maudlin ?[name] ?Weeks in Treatment: 0 ?Height (in): ?Weight (lbs): ?Body Mass Index (BMI): ?Nutrition Risk Screening Items ?Score Screening ?NUTRITION RISK SCREEN: ?I have an illness or condition that made me change the kind and/or amount of food I eat 0 No ?I eat fewer than two meals per day 0 No ?I eat few fruits and vegetables, or milk products 0 No ?I have three or more drinks of beer, liquor or wine almost every day 0 No ?I have tooth or mouth problems that make it hard for me to eat 0 No ?I don't always have enough money to buy the food I need 0 No ?I eat alone most of the time 0 No ?I take three or more different prescribed or over-the-counter drugs a day 0 No ?Without wanting to, I have lost or gained 10 pounds in the last six months 0 No ?I am not always physically able to shop, cook and/or feed myself 0 No ?Nutrition Protocols ?Good Risk Protocol 0 No interventions needed ?Moderate Risk Protocol ?High Risk Proctocol ?Risk Level: Good Risk ?Score: 0 ?Electronic Signature(s) ?Signed: 01/15/2022 12:57:05 PM By: Rhae Hammock RN ?Entered By: Rhae Hammock on 01/13/2022 13:23:09 ?

## 2022-01-15 NOTE — Progress Notes (Signed)
EDDIS, PINGLETON ([id number]) ?Visit Report for 01/13/2022 ?Chief Complaint Document Details ?Patient Name: Date of Service: ?[name], [name] 01/13/2022 1:15 PM ?Medical Record Number: [id number] ?Patient Account Number: [id number] ?Date of Birth/Sex: Treating RN: ?November 14, 1945 (76 y.o. F) ?Primary Care Provider: [name] Other Clinician: ?Referring Provider: ?Treating Provider/Extender: Fredirick Maudlin ?[name] ?Weeks in Treatment: 0 ?Information Obtained from: Patient ?Chief Complaint ?Patient presents to the wound care center due with non-wound condition(s) ?Electronic Signature(s) ?Signed: 01/13/2022 1:37:47 PM By: Fredirick Maudlin MD FACS ?Entered By: Fredirick Maudlin on 01/13/2022 13:37:47 ?-------------------------------------------------------------------------------- ?HPI Details ?Patient Name: Date of Service: ?[name], [name] 01/13/2022 1:15 PM ?Medical Record Number: [id number] ?Patient Account Number: [id number] ?Date of Birth/Sex: Treating RN: ?May 08, 1946 (76 y.o. F) ?Primary Care Provider: [name] Other Clinician: ?Referring Provider: ?Treating Provider/Extender: Fredirick Maudlin ?[name] ?Weeks in Treatment: 0 ?History of Present Illness ?HPI Description: ADMISSION ?01/13/2022 ?This is a 76 year old woman who is referred to the wound care center by her orthopedic surgeon. She had a knee replacement last August and apparently ?noticed hammertoe deformities bilaterally after undergoing this procedure. It somewhat challenging to understand the reason for the referral as the patient ?herself states that she has never had a wound and the available information from the orthopedist also confirms that there is no open site with drainage or ?anything to suggest an active wound process. She has no chronic health problems that would be contributing to wound formation or poor wound healing. ?On the DIP joint of her right second toe, there is a callus. This was removed with sharp debridement  and no open wound was present. ?Electronic Signature(s) ?Signed: 01/13/2022 1:40:02 PM By: Fredirick Maudlin MD FACS ?Entered By: Fredirick Maudlin on 01/13/2022 13:40:02 ?-------------------------------------------------------------------------------- ?Physical Exam Details ?Patient Name: Date of Service: ?[name], [name] 01/13/2022 1:15 PM ?Medical Record Number: [id number] ?Patient Account Number: [id number] ?Date of Birth/Sex: Treating RN: ?[birth year] (76 y.o. F) ?Primary Care Provider: Other Clinician: ?[name] ?Referring Provider: ?Treating Provider/Extender: Fredirick Maudlin ?[name] ?Weeks in Treatment: 0 ?Constitutional ?......... ?Respiratory ?Normal work of breathing on room air.[name] ?Cardiovascular ?2+ nonpitting edema to the bilateral lower extremities.[name] ?Notes ?01/13/2022: On the DIP joint of her right second toe, there is a callus. This was removed and there is no underlying wound. ?Electronic Signature(s) ?Signed: 01/13/2022 1:40:43 PM By: Fredirick Maudlin MD FACS ?Entered By: Fredirick Maudlin on 01/13/2022 13:40:43 ?-------------------------------------------------------------------------------- ?Physician Orders Details ?Patient Name: Date of Service: ?[name], [name] 01/13/2022 1:15 PM ?Medical Record Number: [id number] ?Patient Account Number: [id number] ?Date of Birth/Sex: Treating RN: ?Mar 22, 1946 (76 y.o. [name], [name] ?Primary Care Provider: [name] Other Clinician: ?Referring Provider: ?Treating Provider/Extender: Fredirick Maudlin ?[name] ?Weeks in Treatment: 0 ?Verbal / Phone Orders: No ?Diagnosis Coding ?ICD-10 Coding ?Code Description ?L84 Corns and callosities ?Discharge From [facility] Services ?Discharge from [place]!!! ?Electronic Signature(s) ?Signed: 01/13/2022 1:41:42 PM By: Fredirick Maudlin MD FACS ?Entered By: Fredirick Maudlin on 01/13/2022 13:41:42 ?-------------------------------------------------------------------------------- ?Problem  List Details ?Patient Name: Date of Service: ?[name], [name] 01/13/2022 1:15 PM ?Medical Record Number: [id number] ?Patient Account Number: [id number] ?Date of Birth/Sex: Treating RN: ?12/28/1945 (76 y.o. F) ?Primary Care Provider: [name] Other Clinician: ?Referring Provider: ?Treating Provider/Extender: Fredirick Maudlin ?[name] ?Weeks in Treatment: 0 ?Active Problems ?ICD-10 ?Encounter ?Code Description Active Date MDM ?Diagnosis ?L84 Corns and callosities 01/13/2022 No Yes ?Inactive Problems ?Resolved Problems ?Electronic Signature(s) ?Signed: 01/13/2022 1:36:44 PM By: Fredirick Maudlin MD FACS ?Entered By: Fredirick Maudlin on 01/13/2022 13:36:44 ?-------------------------------------------------------------------------------- ?  Progress Note Details ?Patient Name: Date of Service: ?[name], [name] 01/13/2022 1:15 PM ?Medical Record Number: [id number] ?Patient Account Number: [id number] ?Date of Birth/Sex: Treating RN: ?1946-05-15 (76 y.o. F) ?Primary Care Provider: [name] Other Clinician: ?Referring Provider: ?Treating Provider/Extender: Fredirick Maudlin ?[name] ?Weeks in Treatment: 0 ?Subjective ?Chief Complaint ?Information obtained from Patient ?Patient presents to the wound care center due with non-wound condition(s) ?History of Present Illness (HPI) ?ADMISSION ?01/13/2022 ?This is a 76 year old woman who is referred to the wound care center by her orthopedic surgeon. She had a knee replacement last August and apparently ?noticed hammertoe deformities bilaterally after undergoing this procedure. It somewhat challenging to understand the reason for the referral as the patient ?herself states that she has never had a wound and the available information from the orthopedist also confirms that there is no open site with drainage or ?anything to suggest an active wound process. She has no chronic health problems that would be contributing to wound formation or poor wound healing. ?On the  DIP joint of her right second toe, there is a callus. This was removed with sharp debridement and no open wound was present. ?Patient History ?Allergies ?No Known Allergies ?Family History ?Unknown History. ?Social History ?Never smoker, Marital Status - Married, Alcohol Use - Never, Drug Use - No History, Caffeine Use - Rarely. ?Medical History ?Eyes ?Denies history of Cataracts, Glaucoma, Optic Neuritis ?Musculoskeletal ?Patient has history of Osteoarthritis ?Psychiatric ?Denies history of Anorexia/bulimia, Confinement Anxiety ?Hospitalization/Surgery History - right knee replacement. ?Medical A Surgical History Notes ?nd ?Endocrine ?Hypothyroid ?Oncologic ?cancer of the rectum ?Review of Systems (ROS) ?Constitutional Symptoms (General Health) ?Denies complaints or symptoms of Fatigue, Fever, Chills, Marked Weight Change. ?Eyes ?Denies complaints or symptoms of Dry Eyes, Vision Changes, Glasses / Contacts. ?Ear/Nose/Mouth/Throat ?Denies complaints or symptoms of Chronic sinus problems or rhinitis. ?Respiratory ?Denies complaints or symptoms of Chronic or frequent coughs, Shortness of Breath. ?Cardiovascular ?Denies complaints or symptoms of Chest pain. ?Gastrointestinal ?Denies complaints or symptoms of Frequent diarrhea, Nausea, Vomiting. ?Endocrine ?Denies complaints or symptoms of Heat/cold intolerance. ?Genitourinary ?Denies complaints or symptoms of Frequent urination. ?Integumentary (Skin) ?Denies complaints or symptoms of Wounds. ?Neurologic ?Denies complaints or symptoms of Numbness/parasthesias. ?Psychiatric ?Denies complaints or symptoms of Claustrophobia, Suicidal. ?Objective ?Constitutional ?Vitals Time Taken: 1:09 PM, Temperature: 98.6 ??F, Pulse: 75 bpm, Respiratory Rate: 17 breaths/min, Blood Pressure: 131/69 mmHg. ?Respiratory ?Normal work of breathing on room air.[name] ?Cardiovascular ?2+ nonpitting edema to the bilateral lower extremities.[name] ?General Notes: 01/13/2022: On the DIP joint of her right  second toe, there is a callus. This was removed and there is no underlying wound. ?Assessment ?Active Problems ?ICD-10 ?Corns and callosities ?Plan ?Discharge From [facility] Services: ?Discharge from [facility]ound [facility]

## 2022-01-20 DIAGNOSIS — R609 Edema, unspecified: Secondary | ICD-10-CM | POA: Diagnosis not present

## 2022-01-20 DIAGNOSIS — M2042 Other hammer toe(s) (acquired), left foot: Secondary | ICD-10-CM | POA: Diagnosis not present

## 2022-01-20 DIAGNOSIS — M21612 Bunion of left foot: Secondary | ICD-10-CM | POA: Diagnosis not present

## 2022-01-20 DIAGNOSIS — M2041 Other hammer toe(s) (acquired), right foot: Secondary | ICD-10-CM | POA: Diagnosis not present

## 2022-01-20 DIAGNOSIS — M21611 Bunion of right foot: Secondary | ICD-10-CM | POA: Diagnosis not present

## 2022-02-24 DIAGNOSIS — M25561 Pain in right knee: Secondary | ICD-10-CM | POA: Diagnosis not present

## 2022-02-24 DIAGNOSIS — R609 Edema, unspecified: Secondary | ICD-10-CM | POA: Diagnosis not present

## 2022-02-24 DIAGNOSIS — I10 Essential (primary) hypertension: Secondary | ICD-10-CM | POA: Diagnosis not present

## 2022-02-24 DIAGNOSIS — R600 Localized edema: Secondary | ICD-10-CM | POA: Diagnosis not present

## 2022-03-09 DIAGNOSIS — F331 Major depressive disorder, recurrent, moderate: Secondary | ICD-10-CM | POA: Diagnosis not present

## 2022-03-09 DIAGNOSIS — F411 Generalized anxiety disorder: Secondary | ICD-10-CM | POA: Diagnosis not present

## 2022-03-30 DIAGNOSIS — I10 Essential (primary) hypertension: Secondary | ICD-10-CM | POA: Diagnosis not present

## 2022-03-30 DIAGNOSIS — E785 Hyperlipidemia, unspecified: Secondary | ICD-10-CM | POA: Diagnosis not present

## 2022-03-30 DIAGNOSIS — M25562 Pain in left knee: Secondary | ICD-10-CM | POA: Diagnosis not present

## 2022-03-30 DIAGNOSIS — M21612 Bunion of left foot: Secondary | ICD-10-CM | POA: Diagnosis not present

## 2022-03-30 DIAGNOSIS — M21611 Bunion of right foot: Secondary | ICD-10-CM | POA: Diagnosis not present

## 2022-03-30 DIAGNOSIS — R600 Localized edema: Secondary | ICD-10-CM | POA: Diagnosis not present

## 2022-03-30 DIAGNOSIS — M25561 Pain in right knee: Secondary | ICD-10-CM | POA: Diagnosis not present

## 2022-04-21 ENCOUNTER — Ambulatory Visit: Payer: Medicare PPO | Admitting: Orthopedic Surgery

## 2022-04-21 ENCOUNTER — Ambulatory Visit (INDEPENDENT_AMBULATORY_CARE_PROVIDER_SITE_OTHER): Payer: Medicare PPO

## 2022-04-21 DIAGNOSIS — M25561 Pain in right knee: Secondary | ICD-10-CM | POA: Diagnosis not present

## 2022-04-21 DIAGNOSIS — M79671 Pain in right foot: Secondary | ICD-10-CM

## 2022-04-24 ENCOUNTER — Encounter: Payer: Self-pay | Admitting: Orthopedic Surgery

## 2022-04-24 NOTE — Progress Notes (Signed)
Office Visit Note   [name]           Date of Birth: 26-Aug-1946           MRN: [id number] Visit Date: 04/21/2022 Requested by: Laurann Montana, MD [id number] [place],  [place] [address] PCP: Laurann Montana, MD  Subjective: Chief Complaint  Patient presents with   Right Knee - Pain   Right Foot - Pain    HPI: [name] is a 76 year old patient with bilateral knee pain right worse than left.  She had a right total knee replacement 06/09/2021.  Reports relatively constant pain and she ambulates with a cane.  She also has bilateral foot pain and deformity right worse than left.  She describes a knifelike pain in the knee.  She would like to get shoes made for the significant deformity she has in both feet which surprisingly is not particularly painful.  Denies any fevers or chills.              ROS: All systems reviewed are negative as they relate to the chief complaint within the history of present illness.  Patient denies  fevers or chills.   Assessment & Plan: Visit Diagnoses:  1. Pain in right foot   2. Right knee pain, unspecified chronicity     Plan: Impression is well-functioning right total knee replacement cemented in good position alignment with no complicating features.  Still having a lot of pain in the knee.  No effusion in the knee.  Recommend CT scan of the right knee with referral to podiatrist to evaluate for special shoes to be made to accommodate her forefoot deformity.  Follow-up with me after CT scan.  Follow-Up Instructions: No follow-ups on file.   Orders:  Orders Placed This Encounter  Procedures   XR Foot Complete Right   XR Knee 1-2 Views Right   Ambulatory referral to Podiatry   No orders of the defined types were placed in this encounter.     Procedures: No procedures performed   Clinical Data: No additional findings.  Objective: Vital Signs: There were no vitals taken for this visit.  Physical Exam:    Constitutional: Patient appears well-developed HEENT:  Head: Normocephalic Eyes:EOM are normal Neck: Normal range of motion Cardiovascular: Normal rate Pulmonary/chest: Effort normal Neurologic: Patient is alert Skin: Skin is warm Psychiatric: Patient has normal mood and affect   Ortho Exam: Ortho exam demonstrates no warmth or effusion in the right knee.  Range of motion is 0 to about 105.  Collateral  ligaments are stable.  No proximal lymphadenopathy or masses present.  Quad strength is good.  No crepitus or atypical patellar instability patterns with passive or active range of motion.  No tenderness to palpation around the proximal tibia or distal femur.  No skin changes or temperature differences right knee versus left knee.  Specialty Comments:  No specialty comments available.  Imaging: No results found.   PMFS History: Patient Active Problem List   Diagnosis Date Noted   E coli bacteremia    Acute lower UTI    Sepsis due to undetermined organism (HCC) 09/19/2021   Hypokalemia 09/19/2021   Leukopenia 09/19/2021   Prolonged QT interval 09/19/2021   Arthritis of right knee    UTI (urinary tract infection) 06/26/2021   Severe sepsis (HCC) 06/25/2021   Acute metabolic encephalopathy 06/25/2021   Hypothyroid 06/25/2021   S/P total knee arthroplasty, right 06/09/2021   Fever  Staphylococcus aureus bacteremia with sepsis (HCC)    SIRS (systemic inflammatory response syndrome) (HCC) 11/30/2020   Lumbar radiculopathy 10/10/2019   Bilateral stenosis of lateral recess of lumbar spine 10/10/2019   Hemorrhoids, external without complications 06/17/2014   Depression    Anxiety    Rectal bleeding    History of radiation therapy    Malignant neoplasm of anal canal (HCC) 11/15/2011   Hemorrhoids, internal, with bleeding 09/07/2011   Acquired anal stenosis 09/07/2011   Hyperlipidemia 09/07/2011   Family history of breast cancer in sister 09/07/2011   Past Medical  History:  Diagnosis Date   anal ca dx'd 11/2011   squamous cell carcinoma   Anxiety    Arthritis 2022   Depression    Full dentures    Hemorrhoids    external   History of radiation therapy 11/29/11 to 01/05/12   anal canal 5040 cGy 28 sessions, regional lymph nodes 4200 cGy 28 sessions   Hyperlipidemia    Lumbar radiculopathy 10/10/2019   Rectal bleeding    Staphylococcus aureus bacteremia with sepsis (HCC)    UTI (urinary tract infection) 06/26/2021   Vitamin D deficiency    Wears glasses     Family History  Problem Relation Age of Onset   Cancer Sister        breast/ age 53   Cervical cancer Sister        59   Alcohol abuse Maternal Uncle     Past Surgical History:  Procedure Laterality Date   ABDOMINAL HYSTERECTOMY  1978   age 74   BUBBLE STUDY  12/04/2020   Procedure: BUBBLE STUDY;  Surgeon: Parke Poisson, MD;  Location: [facility] END[facility]COPY;  Service: Cardiology;;   EXAMINATION UNDER ANESTHESIA  06/21/2012   Procedure: EXAM UNDER ANESTHESIA;  Surgeon: [name], MD;  Location: [facility];  Service: General;  Laterality: N/A;  rectal exam under anesthesia, anal dilation, rectal biopsy   HEMORRHOID SURGERY  1980   Patient had to guess on the date.   RECTAL BIOPSY  06/21/2012   Procedure: BIOPSY RECTAL;  Surgeon: [name], MD;  Location: [facility];  Service: General;  Laterality: N/A;   TEE WITHOUT CARDIOVERSION N/A 12/04/2020   Procedure: TRANSESOPHAGEAL ECHOCARDIOGRAM (TEE);  Surgeon: Parke Poisson, MD;  Location: [facility] ENDOSCOPY;  Service: Cardiology;  Laterality: N/A;   TOTAL KNEE ARTHROPLASTY Right 06/09/2021   Procedure: RIGHT TOTAL KNEE ARTHROPLASTY;  Surgeon: Cammy Copa, MD;  Location: [facility] OR;  Service: Orthopedics;  Laterality: Right;   TUBAL LIGATION     b/l  age 33   Social History   Occupational History   Not on file  Tobacco Use   Smoking status: Former    Packs/day: 0.50    Years: 25.00    Total pack years:  12.50    Types: Cigarettes    Quit date: 09/07/1987    Years since quitting: 34.6   Smokeless tobacco: Never  Vaping Use   Vaping Use: Never used  Substance and Sexual Activity   Alcohol use: No   Drug use: No   Sexual activity: Not Currently

## 2022-04-26 DIAGNOSIS — Z1231 Encounter for screening mammogram for malignant neoplasm of breast: Secondary | ICD-10-CM | POA: Diagnosis not present

## 2022-04-29 ENCOUNTER — Ambulatory Visit: Payer: Medicare PPO | Admitting: Podiatry

## 2022-04-29 ENCOUNTER — Ambulatory Visit (INDEPENDENT_AMBULATORY_CARE_PROVIDER_SITE_OTHER): Payer: Medicare PPO

## 2022-04-29 DIAGNOSIS — M2042 Other hammer toe(s) (acquired), left foot: Secondary | ICD-10-CM | POA: Diagnosis not present

## 2022-04-29 DIAGNOSIS — M2041 Other hammer toe(s) (acquired), right foot: Secondary | ICD-10-CM

## 2022-04-29 DIAGNOSIS — L84 Corns and callosities: Secondary | ICD-10-CM | POA: Diagnosis not present

## 2022-04-29 DIAGNOSIS — M79671 Pain in right foot: Secondary | ICD-10-CM

## 2022-04-29 DIAGNOSIS — M21619 Bunion of unspecified foot: Secondary | ICD-10-CM

## 2022-04-29 DIAGNOSIS — M79672 Pain in left foot: Secondary | ICD-10-CM

## 2022-04-29 NOTE — Patient Instructions (Signed)
I would get an extra-depth or double depth shoe. You can go to the [facility] to look at these  Address: [address] [place], [place] [address] Phone: [phone]

## 2022-05-01 NOTE — Progress Notes (Signed)
Subjective:   Patient ID: [name], female   DOB: 76 y.o.   MRN: [id number]   HPI 76 year old female presents the office today for concerns of bunion, hammertoes.  Is been doing worse over the last year.  She states this got worse after she had knee surgery.  She denies any recent treatment.  She is not interested in surgical intervention.  She does some chronic swelling to her legs but no pain.  No open lesions that she reports.  No recent treatment.  No other concerns.   Review of Systems  All other systems reviewed and are negative.  Past Medical History:  Diagnosis Date   anal ca dx'd 11/2011   squamous cell carcinoma   Anxiety    Arthritis 2022   Depression    Full dentures    Hemorrhoids    external   History of radiation therapy 11/29/11 to 01/05/12   anal canal 5040 cGy 28 sessions, regional lymph nodes 4200 cGy 28 sessions   Hyperlipidemia    Lumbar radiculopathy 10/10/2019   Rectal bleeding    Staphylococcus aureus bacteremia with sepsis ([place])    UTI (urinary tract infection) 06/26/2021   Vitamin D deficiency    Wears glasses     Past Surgical History:  Procedure Laterality Date   ABDOMINAL HYSTERECTOMY  1978   age 54   BUBBLE STUDY  12/04/2020   Procedure: BUBBLE STUDY;  Surgeon: [name], MD;  Location: [facility];  Service: Cardiology;;   EXAMINATION UNDER ANESTHESIA  06/21/2012   Procedure: EXAM UNDER ANESTHESIA;  Surgeon: [name], MD;  Location: [facility];  Service: General;  Laterality: N/A;  rectal exam under anesthesia, anal dilation, rectal biopsy   [place]   Patient had to guess on the date.   RECTAL BIOPSY  06/21/2012   Procedure: BIOPSY RECTAL;  Surgeon: [name], MD;  Location: [facility];  Service: General;  Laterality: N/A;   TEE WITHOUT CARDIOVERSION N/A 12/04/2020   Procedure: TRANSESOPHAGEAL ECHOCARDIOGRAM (TEE);  Surgeon: [name], MD;  Location: [facility];   Service: Cardiology;  Laterality: N/A;   TOTAL KNEE ARTHROPLASTY Right 06/09/2021   Procedure: RIGHT TOTAL KNEE ARTHROPLASTY;  Surgeon: Meredith Pel, MD;  Location: [facility];  Service: Orthopedics;  Laterality: Right;   TUBAL LIGATION     b/l  age 32     Current Outpatient Medications:    acetaminophen (TYLENOL) 650 MG CR tablet, Take 650 mg by mouth every 8 (eight) hours as needed for pain., Disp: , Rfl:    ARIPiprazole (ABILIFY) 10 MG tablet, Take 10 mg by mouth in the morning., Disp: , Rfl:    aspirin 81 MG chewable tablet, Chew 1 tablet (81 mg total) by mouth 2 (two) times daily. (Patient taking differently: Chew 81 mg by mouth in the morning.), Disp: 30 tablet, Rfl: 0   Calcium Carb-Cholecalciferol (CALCIUM 600+D3 PO), Take 1 tablet by mouth in the morning., Disp: , Rfl:    celecoxib (CELEBREX) 100 MG capsule, Take 1 capsule (100 mg total) by mouth daily as needed for mild pain., Disp: 30 capsule, Rfl: 0   cholecalciferol (VITAMIN D) 25 MCG (1000 UNIT) tablet, Take 1,000 Units by mouth in the morning., Disp: , Rfl:    DULoxetine (CYMBALTA) 60 MG capsule, Take 60 mg by mouth 2 (two) times daily., Disp: , Rfl:    gabapentin (NEURONTIN) 600 MG tablet, Take 600 mg by mouth 2 (  two) times daily., Disp: , Rfl:    levothyroxine (SYNTHROID, LEVOTHROID) 25 MCG tablet, Take 25 mcg by mouth daily before breakfast., Disp: , Rfl:    Multiple Vitamin (MULTIVITAMIN WITH MINERALS) TABS tablet, Take 1 tablet by mouth in the morning. Centrum Silver for Women, Disp: , Rfl:    pravastatin (PRAVACHOL) 40 MG tablet, Take 40 mg by mouth daily., Disp: , Rfl:    traZODone (DESYREL) 100 MG tablet, Take 100 mg by mouth at bedtime., Disp: , Rfl:    vitamin B-12 (CYANOCOBALAMIN) 500 MCG tablet, Take 500 mcg by mouth in the morning., Disp: , Rfl:   No Known Allergies         Objective:  Physical Exam  General: AAO x3, NAD  Dermatological: Preulcerative lesion of the distal aspect left hallux.  There is  dried blood present upon debridement there is no underlying ulceration.  Preulcerative area noted on the dorsal left second toe from irritation site shoes on the significant hammertoe.  There is no surrounding erythema or signs of infection.  Vascular: Dorsalis Pedis artery and Posterior Tibial artery pedal pulses are 2/4 bilateral with immedate capillary fill time.  Chronic lower extremity edema.  There is no pain with calf compression, warmth, erythema.   Neruologic: Grossly intact via light touch bilateral.   Musculoskeletal: Severe bunion, hammertoes are present.  Not able to elicit any discomfort.  She states is not really causing much pain she is cannot wear her shoes.  Muscular strength 5/5 in all groups tested bilateral.  Gait: Unassisted, Nonantalgic.       Assessment:   Significant bunion, hammertoes     Plan:  -Treatment options discussed including all alternatives, risks, and complications -Etiology of symptoms were discussed -X-rays were obtained and reviewed with the patient.  3 views of bilateral feet were obtained.  Severe bunion, hammertoes are present. -Following debrided the preulcerative callus on the left hallux with any complications or bleeding. -We discussed with conservative as well as surgical care.  She is not interested in surgical intervention.  Dispensed different offloading pads.  I also discussed an extra-depth or double depth shoe to avoid pressure.  She is to monitor the preulcerative areas very closely for any skin breakdown as this can easily cause infection.  Trula Slade DPM

## 2022-05-05 ENCOUNTER — Ambulatory Visit
Admission: RE | Admit: 2022-05-05 | Discharge: 2022-05-05 | Disposition: A | Discharge status: Home / Self Care | Payer: Medicare PPO | Source: Ambulatory Visit | Attending: Orthopedic Surgery | Admitting: Orthopedic Surgery

## 2022-05-05 DIAGNOSIS — M25561 Pain in right knee: Secondary | ICD-10-CM

## 2022-05-07 ENCOUNTER — Telehealth: Payer: Self-pay | Admitting: Orthopedic Surgery

## 2022-05-07 NOTE — Telephone Encounter (Signed)
Patient called wanting to know if her results were back from her xray she had done. CB # [phone]

## 2022-05-10 ENCOUNTER — Telehealth: Payer: Self-pay | Admitting: Orthopedic Surgery

## 2022-05-10 NOTE — Telephone Encounter (Signed)
Pt states she had an appt and was not given results of her xray. Please call pt at [phone].

## 2022-05-13 ENCOUNTER — Telehealth: Payer: Self-pay | Admitting: Orthopedic Surgery

## 2022-05-13 NOTE — Telephone Encounter (Signed)
I have called her twice, and just tried her again. No VM space to leave msg. Will try again in a bit

## 2022-05-13 NOTE — Telephone Encounter (Signed)
Pt called again requesting results from her xrays. Please call pt at [phone].

## 2022-05-15 NOTE — Telephone Encounter (Signed)
thx

## 2022-05-17 ENCOUNTER — Ambulatory Visit: Payer: Medicare PPO | Admitting: Orthopedic Surgery

## 2022-05-17 DIAGNOSIS — M25561 Pain in right knee: Secondary | ICD-10-CM | POA: Diagnosis not present

## 2022-05-17 MED ORDER — ACETAMINOPHEN-CODEINE 300-30 MG PO TABS: 1.0000 | ORAL_TABLET | Freq: Three times a day (TID) | ORAL | 0 refills | Status: DC | PRN

## 2022-05-18 ENCOUNTER — Encounter: Payer: Self-pay | Admitting: Orthopedic Surgery

## 2022-05-18 NOTE — Progress Notes (Signed)
Office Visit Note   [name]           Date of Birth: [birth year]           MRN: [id number] Visit Date: 05/17/2022 Requested by: [name], MD [address] [place],  [place] [address] PCP: [name], MD  Subjective: Chief Complaint  Patient presents with   Other    Scan review    HPI: [name] is a 76 year old patient with right knee pain.  Had total knee replacement a year ago.  CT scan suggested possible incompetency of the patella.  Patient is able to extend the knee.  Denies any fevers or chills.  CT scan is reviewed and there is no evidence of loosening.  Patient is able to ambulate as well as go up and down stairs.  She is heading to [place] because her sister died.              ROS: All systems reviewed are negative as they relate to the chief complaint within the history of present illness.  Patient denies  fevers or chills.   Assessment & Plan: Visit Diagnoses:  1. Right knee pain, unspecified chronicity     Plan: Impression is bilateral knee pain right equal to left.  Patient does have some left knee arthritis.  Had right total knee replacement about a year ago.  CT scan suggested possible insufficiency of the patellar tendon.  Clinically the patient has only about a 5 degree extensor lag and the tendon appears functionally and structurally intact on exam and ultrasound evaluation today.  Plan is continued observation.  No evidence of prosthetic loosening or infection.  Follow-up in 4 months  Follow-Up Instructions: Return in about 4 months (around 09/17/2022).   Orders:  Orders Placed This Encounter  Procedures   [place] LIMITED JOINT SPACE STRUCTURES LOW RIGHT(NO LINKED CHARGES)   Meds ordered this encounter  Medications   acetaminophen-codeine (TYLENOL #3) 300-30 MG tablet    Sig: Take 1 tablet by mouth every 8 (eight) hours as needed for moderate pain.    Dispense:  35 tablet    Refill:  0      Procedures: No procedures  performed   Clinical Data: No additional findings.  Objective: Vital Signs: There were no vitals taken for this visit.  Physical Exam:   Constitutional: Patient appears well-developed HEENT:  Head: Normocephalic Eyes:EOM are normal Neck: Normal range of motion Cardiovascular: Normal rate Pulmonary/chest: Effort normal Neurologic: Patient is alert Skin: Skin is warm Psychiatric: Patient has normal mood and affect   Ortho Exam: Ortho exam demonstrates extension to within 5 degrees of full extension compared to the left-hand side.  Collaterals are stable.  No effusion in the knee.  No warmth of the knee.  Incision intact.  Patellar tendon is palpable and on ultrasound examination appears structurally intact.  Patient has excellent tension in the quadriceps extensor mechanism with full extension.  Specialty Comments:  No specialty comments available.  Imaging: No results found.   PMFS History: Patient Active Problem List   Diagnosis Date Noted   E coli bacteremia    Acute lower UTI    Sepsis due to undetermined organism ([place]) 09/19/2021   Hypokalemia 09/19/2021   Leukopenia 09/19/2021   Prolonged QT interval 09/19/2021   Arthritis of right knee    UTI (urinary tract infection) 06/26/2021   Severe sepsis ([place]) 01/74/9449   Acute metabolic encephalopathy 67/59/1638   Hypothyroid 06/25/2021   S/P  total knee arthroplasty, right 06/09/2021   Fever    Staphylococcus aureus bacteremia with sepsis ([place])    SIRS (systemic inflammatory response syndrome) ([place]) 11/30/2020   Lumbar radiculopathy 10/10/2019   Bilateral stenosis of lateral recess of lumbar spine 10/10/2019   Hemorrhoids, external without complications 25/03/3975   Depression    Anxiety    Rectal bleeding    History of radiation therapy    Malignant neoplasm of anal canal ([employer]) 11/15/2011   Hemorrhoids, internal, with bleeding 09/07/2011   Acquired anal stenosis 09/07/2011   Hyperlipidemia 09/07/2011    Family history of breast cancer in sister 09/07/2011   Past Medical History:  Diagnosis Date   anal ca dx'd 11/2011   squamous cell carcinoma   Anxiety    Arthritis 2022   Depression    Full dentures    Hemorrhoids    external   History of radiation therapy 11/29/11 to 01/05/12   anal canal 5040 cGy 28 sessions, regional lymph nodes 4200 cGy 28 sessions   Hyperlipidemia    Lumbar radiculopathy 10/10/2019   Rectal bleeding    Staphylococcus aureus bacteremia with sepsis ([facility])    UTI (urinary tract infection) 06/26/2021   Vitamin D deficiency    Wears glasses     Family History  Problem Relation Age of Onset   Cancer Sister        breast/ age 3   Cervical cancer Sister        52   Alcohol abuse Maternal Uncle     Past Surgical History:  Procedure Laterality Date   ABDOMINAL HYSTERECTOMY  1978   age 55   BUBBLE STUDY  12/04/2020   Procedure: BUBBLE STUDY;  Surgeon: [name], MD;  Location: [facility];  Service: Cardiology;;   EXAMINATION UNDER ANESTHESIA  06/21/2012   Procedure: EXAM UNDER ANESTHESIA;  Surgeon: [name], MD;  Location: [facility];  Service: General;  Laterality: N/A;  rectal exam under anesthesia, anal dilation, rectal biopsy   [place]   Patient had to guess on the date.   RECTAL BIOPSY  06/21/2012   Procedure: BIOPSY RECTAL;  Surgeon: [name], MD;  Location: [facility];  Service: General;  Laterality: N/A;   TEE WITHOUT CARDIOVERSION N/A 12/04/2020   Procedure: TRANSESOPHAGEAL ECHOCARDIOGRAM (TEE);  Surgeon: [name], MD;  Location: [facility];  Service: Cardiology;  Laterality: N/A;   TOTAL KNEE ARTHROPLASTY Right 06/09/2021   Procedure: RIGHT TOTAL KNEE ARTHROPLASTY;  Surgeon: Meredith Pel, MD;  Location: [facility];  Service: Orthopedics;  Laterality: Right;   TUBAL LIGATION     b/l  age 67   Social History   Occupational History   Not on file  Tobacco Use   Smoking  status: Former    Packs/day: 0.50    Years: 25.00    Total pack years: 12.50    Types: Cigarettes    Quit date: 09/07/1987    Years since quitting: 34.7   Smokeless tobacco: Never  Vaping Use   Vaping Use: Never used  Substance and Sexual Activity   Alcohol use: No   Drug use: No   Sexual activity: Not Currently

## 2022-05-27 ENCOUNTER — Telehealth: Payer: Self-pay

## 2022-05-27 DIAGNOSIS — M5416 Radiculopathy, lumbar region: Secondary | ICD-10-CM

## 2022-05-27 NOTE — Telephone Encounter (Signed)
thx

## 2022-05-27 NOTE — Telephone Encounter (Signed)
Patient called back asking for referral for PT. I put in referral for her.

## 2022-05-27 NOTE — Telephone Encounter (Signed)
Patient called stated having back and leg pain and wanted to know if ok to proceed with PT. I advised ok to proceed.

## 2022-05-31 DIAGNOSIS — M25561 Pain in right knee: Secondary | ICD-10-CM | POA: Diagnosis not present

## 2022-05-31 DIAGNOSIS — I10 Essential (primary) hypertension: Secondary | ICD-10-CM | POA: Diagnosis not present

## 2022-05-31 DIAGNOSIS — M545 Low back pain, unspecified: Secondary | ICD-10-CM | POA: Diagnosis not present

## 2022-05-31 DIAGNOSIS — R600 Localized edema: Secondary | ICD-10-CM | POA: Diagnosis not present

## 2022-06-02 DIAGNOSIS — F411 Generalized anxiety disorder: Secondary | ICD-10-CM | POA: Diagnosis not present

## 2022-06-02 DIAGNOSIS — F331 Major depressive disorder, recurrent, moderate: Secondary | ICD-10-CM | POA: Diagnosis not present

## 2022-06-08 ENCOUNTER — Encounter: Payer: Self-pay | Admitting: Physical Therapy

## 2022-06-08 ENCOUNTER — Ambulatory Visit: Payer: Medicare PPO | Admitting: Physical Therapy

## 2022-06-08 DIAGNOSIS — M6281 Muscle weakness (generalized): Secondary | ICD-10-CM | POA: Diagnosis not present

## 2022-06-08 DIAGNOSIS — R600 Localized edema: Secondary | ICD-10-CM | POA: Diagnosis not present

## 2022-06-08 DIAGNOSIS — M25661 Stiffness of right knee, not elsewhere classified: Secondary | ICD-10-CM | POA: Diagnosis not present

## 2022-06-08 DIAGNOSIS — G8929 Other chronic pain: Secondary | ICD-10-CM | POA: Diagnosis not present

## 2022-06-08 DIAGNOSIS — R262 Difficulty in walking, not elsewhere classified: Secondary | ICD-10-CM

## 2022-06-08 DIAGNOSIS — M5459 Other low back pain: Secondary | ICD-10-CM | POA: Diagnosis not present

## 2022-06-08 DIAGNOSIS — M25561 Pain in right knee: Secondary | ICD-10-CM

## 2022-06-08 NOTE — Therapy (Signed)
OUTPATIENT PHYSICAL THERAPY THORACOLUMBAR EVALUATION   Patient Name: [name]: [id number] DOB:Aug 17, 1946, 76 y.o., female Today's Date: 06/08/2022   PT End of Session - 06/08/22 1044     Visit Number 1    Number of Visits 12    Date for PT Re-Evaluation 07/23/22    Authorization Type humana: 12 visits    PT Start Time 0850    PT Stop Time 0930    PT Time Calculation (min) 40 min    Activity Tolerance Patient tolerated treatment well    Behavior During Therapy [facility] for tasks assessed/performed             Past Medical History:  Diagnosis Date   anal ca dx'd 11/2011   squamous cell carcinoma   Anxiety    Arthritis 2022   Depression    Full dentures    Hemorrhoids    external   History of radiation therapy 11/29/11 to 01/05/12   anal canal 5040 cGy 28 sessions, regional lymph nodes 4200 cGy 28 sessions   Hyperlipidemia    Lumbar radiculopathy 10/10/2019   Rectal bleeding    Staphylococcus aureus bacteremia with sepsis ([place])    UTI (urinary tract infection) 06/26/2021   Vitamin D deficiency    Wears glasses    Past Surgical History:  Procedure Laterality Date   ABDOMINAL HYSTERECTOMY  1978   age 81   BUBBLE STUDY  12/04/2020   Procedure: BUBBLE STUDY;  Surgeon: [name], MD;  Location: [place];  Service: Cardiology;;   EXAMINATION UNDER ANESTHESIA  06/21/2012   Procedure: EXAM UNDER ANESTHESIA;  Surgeon: [name], MD;  Location: [place];  Service: General;  Laterality: N/A;  rectal exam under anesthesia, anal dilation, rectal biopsy   [place]   Patient had to guess on the date.   RECTAL BIOPSY  06/21/2012   Procedure: BIOPSY RECTAL;  Surgeon: [name], MD;  Location: [place];  Service: General;  Laterality: N/A;   TEE WITHOUT CARDIOVERSION N/A 12/04/2020   Procedure: TRANSESOPHAGEAL ECHOCARDIOGRAM (TEE);  Surgeon: [name], MD;  Location: [place];  Service: Cardiology;   Laterality: N/A;   TOTAL KNEE ARTHROPLASTY Right 06/09/2021   Procedure: RIGHT TOTAL KNEE ARTHROPLASTY;  Surgeon: Meredith Pel, MD;  Location: [place];  Service: Orthopedics;  Laterality: Right;   TUBAL LIGATION     b/l  age 48   Patient Active Problem List   Diagnosis Date Noted   E coli bacteremia    Acute lower UTI    Sepsis due to undetermined organism ([place]) 09/19/2021   Hypokalemia 09/19/2021   Leukopenia 09/19/2021   Prolonged QT interval 09/19/2021   Arthritis of right knee    UTI (urinary tract infection) 06/26/2021   Severe sepsis ([place]) 49/82/6415   Acute metabolic encephalopathy 83/07/4075   Hypothyroid 06/25/2021   S/P total knee arthroplasty, right 06/09/2021   Fever    Staphylococcus aureus bacteremia with sepsis ([place])    SIRS (systemic inflammatory response syndrome) ([place]) 11/30/2020   Lumbar radiculopathy 10/10/2019   Bilateral stenosis of lateral recess of lumbar spine 10/10/2019   Hemorrhoids, external without complications 80/88/1103   Depression    Anxiety    Rectal bleeding    History of radiation therapy    Malignant neoplasm of anal canal ([place]) 11/15/2011   Hemorrhoids, internal, with bleeding 09/07/2011   Acquired anal stenosis 09/07/2011   Hyperlipidemia 09/07/2011   Family history of breast  cancer in sister 09/07/2011    PCP: [name], MD  REFERRING PROVIDER: Meredith Pel, MD  REFERRING DIAG: [id number] (ICD-10-CM) - Lumbar radiculopathy  Rationale for Evaluation and Treatment Rehabilitation  THERAPY DIAG:  Chronic pain of right knee  Stiffness of right knee, not elsewhere classified  Localized edema  Muscle weakness (generalized)  Difficulty walking  Other low back pain  ONSET DATE:about 1 year ago  SUBJECTIVE:                                                                                                                                                                                           SUBJECTIVE  STATEMENT: Pt stating she had Rt knee surgery about 1 year ago and pt stating she has had trouble since and now her knee hurts worse. Pt also with history lumbar radiculopathy and reports chronic back pain.   PERTINENT HISTORY:  H/o Rt TKA, Anxiety, depression, hyperlipidemia, vit D deficiency, wears glasses  PAIN:  Yes, 8/10 in Rt knee and 9/10 in low back unless pt is wearing a back brace Aggravating factors: not wear back brace, walking makes knee worse Relieves: walking with cane, over the counter pain meds and creams, Sitting helps  PRECAUTIONS: None  WEIGHT BEARING RESTRICTIONS No  FALLS:  Has patient fallen in last 6 months? Yes. Number of falls pt reporting 3-4 LOB "near falls"  LIVING ENVIRONMENT: Lives with: lives with their family and lives alone Lives in: House/apartment Stairs: No Has following equipment at home: Single point cane and shower chair  OCCUPATION: retired, from school system and [employer]  PLOF: Independent with basic ADLs  PATIENT GOALS: Be able to walk with pain, stop hurting, "I don't want any more surgeries"   OBJECTIVE:   DIAGNOSTIC FINDINGS:  IMPRESSION: 1. Status post right total knee arthroplasty without evidence of hardware loosening, acute fracture or dislocation. 2. Suspected rupture of the patellar tendon with resulting patella alta and pseudoarticulation between the patella and the distal femur. The patellar tendon is not well visualized on this examination due to artifact from the total knee arthroplasty. In this clinical context, ultrasound may be helpful to better assess the patellar tendon if clinically warranted.    IMPRESSION: 1. Progressive disc and facet degeneration at L3-4 with a new right foraminal disc protrusion. Severe right neural foraminal stenosis and moderate spinal stenosis. 2. Progressive, moderate spinal stenosis and moderate bilateral neural foraminal stenosis at L5-S1.  PATIENT SURVEYS:  06/08/22: FOTO  63% (predicted 67%)  SCREENING FOR RED FLAGS: 06/08/22 Bowel or bladder incontinence: No Spinal tumors: No Cauda equina syndrome: No Compression fracture: No Abdominal aneurysm: No  COGNITION:  06/08/22 Overall cognitive status: Within functional limits for tasks assessed     SENSATION: 06/08/22: [facility]  MUSCLE LENGTH: 06/08/22 Hamstrings: Right 56 deg; Left 64 deg   POSTURE:  06/08/22  rounded shoulders, forward head, decreased lumbar lordosis, increased thoracic kyphosis, flexed trunk , and weight shift left  PALPATION: 06/08/22  TTP: SI joint on the Rt, Rt knee joint line tenderness  LUMBAR ROM:     AROM  06/08/22  Flexion 62  Extension 8  Right lateral flexion 10  Left lateral flexion 18  Right rotation Limited 75%  Left rotation Limited 75%   (Blank rows = not tested)  LOWER EXTREMITY ROM:     A= active P= passive  Right eval Left eval  Hip flexion A: 90 A: 96  Hip extension A: 10 A: 15  Hip abduction A: 30 A: 35  Hip adduction    Hip internal rotation    Hip external rotation    Knee flexion A: 112 P: 115 A: 120 P: 122  Knee extension A: -6 P: -4 A: 0  Ankle dorsiflexion    Ankle plantarflexion    Ankle inversion    Ankle eversion     (Blank rows = not tested)  LOWER EXTREMITY MMT:    MMT Right eval Left eval  Hip flexion 3/5 4/5  Hip extension 3/5 4/5  Hip abduction 3/5 4-/5  Hip adduction 3/5 4-/5  Hip internal rotation    Hip external rotation    Knee flexion    Knee extension    Ankle dorsiflexion    Ankle plantarflexion    Ankle inversion    Ankle eversion     (Blank rows = not tested)  LUMBAR SPECIAL TESTS:  Straight leg raise test: Negative on Rt Slump test: negative bilateral  FUNCTIONAL TESTS:  5 times sit to stand: 72 seconds with UE support  GAIT: Distance walked: 30 feet  Assistive device utilized: Single point cane Level of assistance: Modified independence Comments: lateral lean to left, decreased step length and foot  clearance with forward flexed posture    TODAY'S TREATMENT  06/08/22:  Reviewed pt's HEP and pt able to return demonstration with verbal cues and tactile assistance with each exercise.  Attempted bridge: pt unable to perform due to hamstring cramping SAQ: x 5 each LE Knee to chest x 2 holding 20 seconds Trunk rotation: x 2 holding 20 seconds LAQ: x 5 each LE holding 3 seconds    PATIENT EDUCATION:  Education details: PT POC, HEP Person educated: Patient Education method: Consulting civil engineer, Demonstration, Tactile cues, Verbal cues, and Handouts Education comprehension: verbalized understanding, returned demonstration, verbal cues required, tactile cues required, and needs further education   HOME EXERCISE PROGRAM: Access Code: [email or web address] (SLR, Trunk Rotation, SKTC, Quad sets, LAQ)  ASSESSMENT:  CLINICAL IMPRESSION: Patient is a 76 y.o. who comes to clinic with complaints of Rt knee pain and low back pain with mobility, strength and movement coordination deficits that impair their ability to perform usual daily and recreational functional activities without increase difficulty/symptoms.  Patient to benefit from skilled PT services to address impairments and limitations to improve to previous level of function without restriction secondary to condition.    OBJECTIVE IMPAIRMENTS Abnormal gait, decreased activity tolerance, decreased balance, decreased endurance, decreased mobility, difficulty walking, decreased ROM, decreased strength, increased edema, impaired flexibility, postural dysfunction, and pain.   ACTIVITY LIMITATIONS carrying, lifting, bending, standing, squatting, sleeping, stairs, and transfers  PARTICIPATION LIMITATIONS: cleaning, laundry, shopping, community activity,  and church  PERSONAL FACTORS 3+ comorbidities: h/o Rt TKA, anxiety, depression  are also affecting patient's functional outcome.   REHAB POTENTIAL: fair to good  CLINICAL DECISION MAKING:  Stable/uncomplicated  EVALUATION COMPLEXITY: Low   GOALS: Goals reviewed with patient? Yes  SHORT TERM GOALS: Target date: 06/29/2022  Patient will demonstrate independent use of initial home exercise program to maintain progress from in clinic treatments. A. Goal status: New  LONG TERM GOALS: Target date: 07/23/22 Patient will demonstrate/report pain at worst less than or equal to 2/10 to facilitate minimal limitation in daily activity secondary to pain symptoms. Goal status: New   Patient will demonstrate independent use of home exercise program to facilitate ability to maintain/progress functional gains from skilled physical therapy services. Goal status: New   Patient will demonstrate FOTO outcome > or = 67 % to indicate reduced disability due to condition. Goal status: New   Pt will improve her 5 time sit to stand to </= 30 seconds with UE support for improved functional mobility.    A. Goal Status: New  5. Pt will improve her BERG balance assessment to >/= 35/56.     A. Goal Status: New     PLAN: PT FREQUENCY: 1-2x/week  PT DURATION: 6 weeks  PLANNED INTERVENTIONS: Therapeutic exercises, Therapeutic activity, Neuromuscular re-education, Balance training, Gait training, Patient/Family education, Self Care, Joint mobilization, Stair training, Dry Needling, Electrical stimulation, Spinal mobilization, Cryotherapy, Moist heat, Traction, Ultrasound, Ionotophoresis '4mg'$ /ml Dexamethasone, and Manual therapy.  PLAN FOR NEXT SESSION:  Review HEP, lumbar stretching, bilateral LE strengthening, Nustep, BERG balance assessment and update goal   Oretha Caprice, PT, MPT  06/08/2022, 10:47 AM  Referring diagnosis? M54.16 (ICD-10-CM) - Lumbar radiculopathy Treatment diagnosis? (if different than referring diagnosis) M25.561, M25.661, R26.2, M54.59, R60.0, M62.81 What was this (referring dx) caused by? '[]'$  Surgery '[]'$  Fall '[x]'$  Ongoing issue '[]'$  Arthritis '[]'$  Other:  ____________  Laterality: '[x]'$  Rt '[]'$  Lt '[]'$  Both  Check all possible CPT codes:  *CHOOSE 10 OR LESS*    '[x]'$  97110 (Therapeutic Exercise)  '[]'$  44034 (SLP Treatment)  '[x]'$  97112 (Neuro Re-ed)   '[]'$  92526 (Swallowing Treatment)   '[x]'$  97116 (Gait Training)   '[]'$  [id number] (Cognitive Training, 1st 15 minutes) '[x]'$  97140 (Manual Therapy)   '[]'$  97130 (Cognitive Training, each add'l 15 minutes)  '[]'$  97164 (Re-evaluation)                              '[]'$  Other, List CPT Code ____________  '[x]'$  97530 (Therapeutic Activities)     '[x]'$  97535 (Self Care)   '[]'$  All codes above (97110 - 97535)  '[]'$  74259 (Mechanical Traction)  '[x]'$  97014 (E-stim Unattended)  '[]'$  97032 (E-stim manual)  '[]'$  97033 (Ionto)  '[x]'$  97035 (Ultrasound) '[x]'$  56387 (Physical Performance Training) '[]'$  [id number] (Aquatic Therapy) '[]'$  97016 (Vasopneumatic Device) '[]'$  [id number] (Paraffin) '[]'$  97034 (Contrast Bath) '[]'$  97597 (Wound Care 1st 20 sq cm) '[]'$  97598 (Wound Care each add'l 20 sq cm) '[]'$  97760 (Orthotic Fabrication, Fitting, Training Initial) '[]'$  [id number] (Prosthetic Management and Training Initial) '[]'$  [id number] (Orthotic or Prosthetic Training/ Modification Subsequent)

## 2022-06-09 DIAGNOSIS — Z01419 Encounter for gynecological examination (general) (routine) without abnormal findings: Secondary | ICD-10-CM | POA: Diagnosis not present

## 2022-06-09 DIAGNOSIS — Z6831 Body mass index (BMI) 31.0-31.9, adult: Secondary | ICD-10-CM | POA: Diagnosis not present

## 2022-06-11 ENCOUNTER — Ambulatory Visit: Payer: Medicare PPO | Admitting: Rehabilitative and Restorative Service Providers"

## 2022-06-11 ENCOUNTER — Encounter: Payer: Self-pay | Admitting: Rehabilitative and Restorative Service Providers"

## 2022-06-11 DIAGNOSIS — M25561 Pain in right knee: Secondary | ICD-10-CM | POA: Diagnosis not present

## 2022-06-11 DIAGNOSIS — R262 Difficulty in walking, not elsewhere classified: Secondary | ICD-10-CM

## 2022-06-11 DIAGNOSIS — G8929 Other chronic pain: Secondary | ICD-10-CM

## 2022-06-11 DIAGNOSIS — R600 Localized edema: Secondary | ICD-10-CM | POA: Diagnosis not present

## 2022-06-11 DIAGNOSIS — M25661 Stiffness of right knee, not elsewhere classified: Secondary | ICD-10-CM

## 2022-06-11 DIAGNOSIS — M5459 Other low back pain: Secondary | ICD-10-CM | POA: Diagnosis not present

## 2022-06-11 DIAGNOSIS — M6281 Muscle weakness (generalized): Secondary | ICD-10-CM

## 2022-06-11 NOTE — Therapy (Signed)
OUTPATIENT PHYSICAL THERAPY TREATMENT NOTE   Patient Name: [name]: [id number] DOB:[birth year], 76 y.o., female Today's Date: 06/11/2022  PCP: [name], MD REFERRING PROVIDER: Meredith Pel, MD  END OF SESSION:   PT End of Session - 06/11/22 1534     Visit Number 2    Number of Visits 12    Date for PT Re-Evaluation 07/23/22    Authorization Type humana: 12 visits    PT Start Time 1147    PT Stop Time 1231    PT Time Calculation (min) 44 min    Activity Tolerance Patient tolerated treatment well;Patient limited by fatigue    Behavior During Therapy [facility] for tasks assessed/performed             Past Medical History:  Diagnosis Date   anal ca dx'd 11/2011   squamous cell carcinoma   Anxiety    Arthritis 2022   Depression    Full dentures    Hemorrhoids    external   History of radiation therapy 11/29/11 to 01/05/12   anal canal 5040 cGy 28 sessions, regional lymph nodes 4200 cGy 28 sessions   Hyperlipidemia    Lumbar radiculopathy 10/10/2019   Rectal bleeding    Staphylococcus aureus bacteremia with sepsis ([place])    UTI (urinary tract infection) 06/26/2021   Vitamin D deficiency    Wears glasses    Past Surgical History:  Procedure Laterality Date   ABDOMINAL HYSTERECTOMY  1978   age 75   BUBBLE STUDY  12/04/2020   Procedure: BUBBLE STUDY;  Surgeon: [name], MD;  Location: [place];  Service: Cardiology;;   EXAMINATION UNDER ANESTHESIA  06/21/2012   Procedure: EXAM UNDER ANESTHESIA;  Surgeon: [name], MD;  Location: [place];  Service: General;  Laterality: N/A;  rectal exam under anesthesia, anal dilation, rectal biopsy   [place]   Patient had to guess on the date.   RECTAL BIOPSY  06/21/2012   Procedure: BIOPSY RECTAL;  Surgeon: [name], MD;  Location: [place];  Service: General;  Laterality: N/A;   TEE WITHOUT CARDIOVERSION N/A 12/04/2020   Procedure:  TRANSESOPHAGEAL ECHOCARDIOGRAM (TEE);  Surgeon: [name], MD;  Location: [place];  Service: Cardiology;  Laterality: N/A;   TOTAL KNEE ARTHROPLASTY Right 06/09/2021   Procedure: RIGHT TOTAL KNEE ARTHROPLASTY;  Surgeon: Meredith Pel, MD;  Location: [place];  Service: Orthopedics;  Laterality: Right;   TUBAL LIGATION     b/l  age 71   Patient Active Problem List   Diagnosis Date Noted   E coli bacteremia    Acute lower UTI    Sepsis due to undetermined organism ([place]) 09/19/2021   Hypokalemia 09/19/2021   Leukopenia 09/19/2021   Prolonged QT interval 09/19/2021   Arthritis of right knee    UTI (urinary tract infection) 06/26/2021   Severe sepsis ([place]) 34/74/2595   Acute metabolic encephalopathy 63/87/5643   Hypothyroid 06/25/2021   S/P total knee arthroplasty, right 06/09/2021   Fever    Staphylococcus aureus bacteremia with sepsis ([place])    SIRS (systemic inflammatory response syndrome) ([place]) 11/30/2020   Lumbar radiculopathy 10/10/2019   Bilateral stenosis of lateral recess of lumbar spine 10/10/2019   Hemorrhoids, external without complications 32/95/1884   Depression    Anxiety    Rectal bleeding    History of radiation therapy    Malignant neoplasm of anal canal ([place]) 11/15/2011   Hemorrhoids, internal, with  bleeding 09/07/2011   Acquired anal stenosis 09/07/2011   Hyperlipidemia 09/07/2011   Family history of breast cancer in sister 09/07/2011    REFERRING DIAG: M54.16 (ICD-10-CM) - Lumbar radiculopathy  THERAPY DIAG:  Chronic pain of right knee  Stiffness of right knee, not elsewhere classified  Localized edema  Muscle weakness (generalized)  Difficulty walking  Other low back pain  Rationale for Evaluation and Treatment Rehabilitation  PERTINENT HISTORY: H/o Rt TKA, Anxiety, depression, hyperlipidemia, vit D deficiency, wears glasses  PRECAUTIONS: Back  SUBJECTIVE: [name] reports good compliance with her day 1 HEP.  She had questions  which we addressed.  PAIN:  Are you having pain? Yes: NPRS scale: 8-9/10 Pain location: Low back and R knee Pain description: Ache, sore, can get sharp Aggravating factors: Prolonged standing and walking Relieving factors: Change of position, pain meds, back brace   OBJECTIVE: (objective measures completed at initial evaluation unless otherwise dated)  OBJECTIVE:    DIAGNOSTIC FINDINGS:  IMPRESSION: 1. Status post right total knee arthroplasty without evidence of hardware loosening, acute fracture or dislocation. 2. Suspected rupture of the patellar tendon with resulting patella alta and pseudoarticulation between the patella and the distal femur. The patellar tendon is not well visualized on this examination due to artifact from the total knee arthroplasty. In this clinical context, ultrasound may be helpful to better assess the patellar tendon if clinically warranted.     IMPRESSION: 1. Progressive disc and facet degeneration at L3-4 with a new right foraminal disc protrusion. Severe right neural foraminal stenosis and moderate spinal stenosis. 2. Progressive, moderate spinal stenosis and moderate bilateral neural foraminal stenosis at L5-S1.   PATIENT SURVEYS:  06/08/22: FOTO 63% (predicted 67%)   SCREENING FOR RED FLAGS: 06/08/22 Bowel or bladder incontinence: No Spinal tumors: No Cauda equina syndrome: No Compression fracture: No Abdominal aneurysm: No   COGNITION:           06/08/22 Overall cognitive status: Within functional limits for tasks assessed                             SENSATION: 06/08/22: [facility]   MUSCLE LENGTH: 06/08/22 Hamstrings: Right 56 deg; Left 64 deg     POSTURE:  06/08/22  rounded shoulders, forward head, decreased lumbar lordosis, increased thoracic kyphosis, flexed trunk , and weight shift left   PALPATION: 06/08/22  TTP: SI joint on the Rt, Rt knee joint line tenderness   LUMBAR ROM:      AROM  06/08/22  Flexion 62  Extension 8  Right  lateral flexion 10  Left lateral flexion 18  Right rotation Limited 75%  Left rotation Limited 75%   (Blank rows = not tested)   LOWER EXTREMITY ROM:      A= active P= passive  Right eval Left eval  Hip flexion A: 90 A: 96  Hip extension A: 10 A: 15  Hip abduction A: 30 A: 35  Hip adduction      Hip internal rotation      Hip external rotation      Knee flexion A: 112 P: 115 A: 120 P: 122  Knee extension A: -6 P: -4 A: 0  Ankle dorsiflexion      Ankle plantarflexion      Ankle inversion      Ankle eversion       (Blank rows = not tested)   LOWER EXTREMITY MMT:     MMT Right eval Left  eval  Hip flexion 3/5 4/5  Hip extension 3/5 4/5  Hip abduction 3/5 4-/5  Hip adduction 3/5 4-/5  Hip internal rotation      Hip external rotation      Knee flexion      Knee extension      Ankle dorsiflexion      Ankle plantarflexion      Ankle inversion      Ankle eversion       (Blank rows = not tested)   LUMBAR SPECIAL TESTS:  Straight leg raise test: Negative on Rt Slump test: negative bilateral   FUNCTIONAL TESTS:  5 times sit to stand: 72 seconds with UE support   GAIT: Distance walked: 30 feet  Assistive device utilized: Single point cane Level of assistance: Modified independence Comments: lateral lean to left, decreased step length and foot clearance with forward flexed posture       TODAY'S TREATMENT  06/11/2022 Standing lumbar extension (shift correct 1st) 10X 3 seconds Shoulder blade pinches (SBP) 10X 5 seconds Sit to stand with SBP and lumbar extension when standing 2 sets of 5  Shift correction (push R hip to L and bring shoulders back to the R) 2 sets of 10 for 3 seconds Pelvic rotation 3X 20 seconds B Single knee to chest (other leg straight) 3X 20 seconds Supine quadriceps sets 10X 5 seconds Held SLR and seated knee extension due to significant quadriceps lag   06/08/22:  Reviewed pt's HEP and pt able to return demonstration with verbal cues and  tactile assistance with each exercise.  Attempted bridge: pt unable to perform due to hamstring cramping SAQ: x 5 each LE Knee to chest x 2 holding 20 seconds Trunk rotation: x 2 holding 20 seconds LAQ: x 5 each LE holding 3 seconds       PATIENT EDUCATION:  Education details: PT POC, HEP Person educated: Patient Education method: Consulting civil engineer, Demonstration, Tactile cues, Verbal cues, and Handouts Education comprehension: verbalized understanding, returned demonstration, verbal cues required, tactile cues required, and needs further education     HOME EXERCISE PROGRAM: Access Code: [email or web address] URL: [email or web address] Date: 06/11/2022 Prepared by: Vista Mink  Exercises - Supine Lower Trunk Rotation  - 2 x daily - 7 x weekly - 3 reps - 20 seconds hold - Supine Quad Set  - 5 x daily - 7 x weekly - 2 sets - 10 reps - 5 seconds hold - Left Standing Lateral Shift Correction at [place]  - 5 x daily - 7 x weekly - 1 sets - 5 reps - 3 seconds hold - Standing Scapular Retraction  - 5 x daily - 7 x weekly - 1 sets - 5 reps - 5 second hold - Sit to Stand with Armchair  - 2 x daily - 7 x weekly - 1 sets - 5 reps - Single Knee to Chest Stretch  - 2 x daily - 7 x weekly - 1 sets - 3 reps - 20 seconds hold   ASSESSMENT:   CLINICAL IMPRESSION: [name] did a good job with her day 1 HEP.  We held on 2 activities due to quadriceps lag and added activities to address this.  She has a significant lateral shift in sitting and standing and this was also addressed during today's visit.  [name] will benefit from general postural correction, low back and quadriceps strengthening to address impairments noted at evaluation.      OBJECTIVE IMPAIRMENTS Abnormal gait, decreased activity tolerance, decreased balance, decreased  endurance, decreased mobility, difficulty walking, decreased ROM, decreased strength, increased edema, impaired flexibility, postural dysfunction, and pain.     ACTIVITY LIMITATIONS carrying, lifting, bending, standing, squatting, sleeping, stairs, and transfers   PARTICIPATION LIMITATIONS: cleaning, laundry, shopping, community activity, and church   PERSONAL FACTORS 3+ comorbidities: h/o Rt TKA, anxiety, depression  are also affecting patient's functional outcome.    REHAB POTENTIAL: fair to good   CLINICAL DECISION MAKING: Stable/uncomplicated   EVALUATION COMPLEXITY: Low     GOALS: Goals reviewed with patient? Yes   SHORT TERM GOALS: Target date: 06/29/2022   Patient will demonstrate independent use of initial home exercise program to maintain progress from in clinic treatments. A. Goal status: New   LONG TERM GOALS: Target date: 07/23/22 Patient will demonstrate/report pain at worst less than or equal to 2/10 to facilitate minimal limitation in daily activity secondary to pain symptoms. Goal status: New   Patient will demonstrate independent use of home exercise program to facilitate ability to maintain/progress functional gains from skilled physical therapy services. Goal status: New   Patient will demonstrate FOTO outcome > or = 67 % to indicate reduced disability due to condition. Goal status: New   Pt will improve her 5 time sit to stand to </= 30 seconds with UE support for improved functional mobility.                   A. Goal Status: New   5. Pt will improve her BERG balance assessment to >/= 35/56.                   A. Goal Status: New         PLAN: PT FREQUENCY: 1-2x/week   PT DURATION: 6 weeks   PLANNED INTERVENTIONS: Therapeutic exercises, Therapeutic activity, Neuromuscular re-education, Balance training, Gait training, Patient/Family education, Self Care, Joint mobilization, Stair training, Dry Needling, Electrical stimulation, Spinal mobilization, Cryotherapy, Moist heat, Traction, Ultrasound, Ionotophoresis '4mg'$ /ml Dexamethasone, and Manual therapy.   PLAN FOR NEXT SESSION:  Review HEP, lumbar strengthening,  bilateral LE (quadriceps) strengthening, Nustep, shift correction, BERG balance assessment and update goal     Farley Ly, PT, MPT 06/11/2022, 3:42 PM

## 2022-06-14 ENCOUNTER — Encounter: Payer: Self-pay | Admitting: Orthopedic Surgery

## 2022-06-14 ENCOUNTER — Ambulatory Visit: Payer: Medicare PPO | Admitting: Surgical

## 2022-06-14 DIAGNOSIS — M25561 Pain in right knee: Secondary | ICD-10-CM | POA: Diagnosis not present

## 2022-06-14 NOTE — Progress Notes (Signed)
Office Visit Note   Patient: JAMEL DUNTON           Date of Birth: 1946/03/20           MRN: [id number] Visit Date: 06/14/2022 Requested by: [name], MD [address],  [place] [id number] PCP: [name], MD  Subjective: Chief Complaint  Patient presents with   Right Knee - Pain    HPI: GLORINE HANRATTY is a 76 y.o. female who presents to the office complaining of right knee pain.  Patient returns following appointment about 1 month ago.  She states that she has been doing physical therapy upstairs and has been to 1 session.  Not sure if it is helpful yet.  She complains of constant anterior pain but no weakness.  Mostly pain with ambulation and with getting up from a seated position where she feels a stabbing sensation in the anterior part of the knee.  She walks well without instability as long as she uses a cane in her right hand.  Not bothering her enough for any sort of surgical intervention.              ROS: All systems reviewed are negative as they relate to the chief complaint within the history of present illness.  Patient denies fevers or chills.  Assessment & Plan: Visit Diagnoses:  1. Right knee pain, unspecified chronicity     Plan: Patient is a 76 year old female who returns for reevaluation of right knee pain.  She had ultrasound at her last visit by Dr. Marlou Sa demonstrating intact patellar tendon.  She has been doing physical therapy briefly with no relief yet after 1 session.  Recommend she continue with physical therapy and follow-up in late October early November for reevaluation of her strength and how much her pain has improved.  Currently not bothering her enough for any sort of surgical intervention.  Follow-Up Instructions: No follow-ups on file.   Orders:  No orders of the defined types were placed in this encounter.  No orders of the defined types were placed in this encounter.     Procedures: No procedures  performed   Clinical Data: No additional findings.  Objective: Vital Signs: There were no vitals taken for this visit.  Physical Exam:  Constitutional: Patient appears well-developed HEENT:  Head: Normocephalic Eyes:EOM are normal Neck: Normal range of motion Cardiovascular: Normal rate Pulmonary/chest: Effort normal Neurologic: Patient is alert Skin: Skin is warm Psychiatric: Patient has normal mood and affect  Ortho Exam: Ortho exam demonstrates right knee with no effusion.  Incision is well-healed from prior total knee arthroplasty.  Able to perform straight leg raise with no significant extensor lag.  She has 5/5 quad strength on exam today.  No calf tenderness.  Negative Homans' sign.  She has slight elevation of her patella of the right knee relative to the left.  She has mild tenderness over the patellar tendon of the right knee versus the left.  Range of motion from 3 degrees extension to 115 degrees of knee flexion.  Specialty Comments:  No specialty comments available.  Imaging: No results found.   PMFS History: Patient Active Problem List   Diagnosis Date Noted   E coli bacteremia    Acute lower UTI    Sepsis due to undetermined organism ([place]) 09/19/2021   Hypokalemia 09/19/2021   Leukopenia 09/19/2021   Prolonged QT interval 09/19/2021   Arthritis of right knee    UTI (urinary tract  infection) 06/26/2021   Severe sepsis ([place]) 95/18/8416   Acute metabolic encephalopathy 60/63/0160   Hypothyroid 06/25/2021   S/P total knee arthroplasty, right 06/09/2021   Fever    Staphylococcus aureus bacteremia with sepsis ([place])    SIRS (systemic inflammatory response syndrome) ([place]) 11/30/2020   Lumbar radiculopathy 10/10/2019   Bilateral stenosis of lateral recess of lumbar spine 10/10/2019   Hemorrhoids, external without complications 10/93/2355   Depression    Anxiety    Rectal bleeding    History of radiation therapy    Malignant neoplasm of anal canal ([place])  11/15/2011   Hemorrhoids, internal, with bleeding 09/07/2011   Acquired anal stenosis 09/07/2011   Hyperlipidemia 09/07/2011   Family history of breast cancer in sister 09/07/2011   Past Medical History:  Diagnosis Date   anal ca dx'd 11/2011   squamous cell carcinoma   Anxiety    Arthritis 2022   Depression    Full dentures    Hemorrhoids    external   History of radiation therapy 11/29/11 to 01/05/12   anal canal 5040 cGy 28 sessions, regional lymph nodes 4200 cGy 28 sessions   Hyperlipidemia    Lumbar radiculopathy 10/10/2019   Rectal bleeding    Staphylococcus aureus bacteremia with sepsis ([facility])    UTI (urinary tract infection) 06/26/2021   Vitamin D deficiency    Wears glasses     Family History  Problem Relation Age of Onset   Cancer Sister        breast/ age 48   Cervical cancer Sister        38   Alcohol abuse Maternal Uncle     Past Surgical History:  Procedure Laterality Date   ABDOMINAL HYSTERECTOMY  1978   age 51   BUBBLE STUDY  12/04/2020   Procedure: BUBBLE STUDY;  Surgeon: [name], MD;  Location: [place];  Service: Cardiology;;   EXAMINATION UNDER ANESTHESIA  06/21/2012   Procedure: EXAM UNDER ANESTHESIA;  Surgeon: [name], MD;  Location: [place];  Service: General;  Laterality: N/A;  rectal exam under anesthesia, anal dilation, rectal biopsy   [place]   Patient had to guess on the date.   RECTAL BIOPSY  06/21/2012   Procedure: BIOPSY RECTAL;  Surgeon: [name], MD;  Location: [place];  Service: General;  Laterality: N/A;   TEE WITHOUT CARDIOVERSION N/A 12/04/2020   Procedure: TRANSESOPHAGEAL ECHOCARDIOGRAM (TEE);  Surgeon: [name], MD;  Location: [place];  Service: Cardiology;  Laterality: N/A;   TOTAL KNEE ARTHROPLASTY Right 06/09/2021   Procedure: RIGHT TOTAL KNEE ARTHROPLASTY;  Surgeon: Meredith Pel, MD;  Location: [place];  Service: Orthopedics;   Laterality: Right;   TUBAL LIGATION     b/l  age 54   Social History   Occupational History   Not on file  Tobacco Use   Smoking status: Former    Packs/day: 0.50    Years: 25.00    Total pack years: 12.50    Types: Cigarettes    Quit date: 09/07/1987    Years since quitting: 34.7   Smokeless tobacco: Never  Vaping Use   Vaping Use: Never used  Substance and Sexual Activity   Alcohol use: No   Drug use: No   Sexual activity: Not Currently

## 2022-06-17 ENCOUNTER — Encounter: Payer: Self-pay | Admitting: Rehabilitative and Restorative Service Providers"

## 2022-06-17 ENCOUNTER — Ambulatory Visit: Payer: Medicare PPO | Admitting: Rehabilitative and Restorative Service Providers"

## 2022-06-17 DIAGNOSIS — M5459 Other low back pain: Secondary | ICD-10-CM | POA: Diagnosis not present

## 2022-06-17 DIAGNOSIS — M25561 Pain in right knee: Secondary | ICD-10-CM | POA: Diagnosis not present

## 2022-06-17 DIAGNOSIS — R262 Difficulty in walking, not elsewhere classified: Secondary | ICD-10-CM

## 2022-06-17 DIAGNOSIS — M6281 Muscle weakness (generalized): Secondary | ICD-10-CM

## 2022-06-17 DIAGNOSIS — R600 Localized edema: Secondary | ICD-10-CM | POA: Diagnosis not present

## 2022-06-17 DIAGNOSIS — G8929 Other chronic pain: Secondary | ICD-10-CM

## 2022-06-17 DIAGNOSIS — M25661 Stiffness of right knee, not elsewhere classified: Secondary | ICD-10-CM | POA: Diagnosis not present

## 2022-06-17 NOTE — Therapy (Signed)
OUTPATIENT PHYSICAL THERAPY TREATMENT NOTE   Patient Name: [name]: [id number] DOB:November 06, 1945, 76 y.o., female Today's Date: 06/17/2022  PCP: [name], MD REFERRING PROVIDER: Meredith Pel, MD  END OF SESSION:   PT End of Session - 06/17/22 1044     Visit Number 3    Number of Visits 12    Date for PT Re-Evaluation 07/23/22    Authorization Type humana: 12 visits    Authorization Time Period until 07/23/2022    Authorization - Visit Number 3    Authorization - Number of Visits 12    PT Start Time 1700    PT Stop Time 1125    PT Time Calculation (min) 40 min    Activity Tolerance Patient limited by pain;Patient limited by fatigue    Behavior During Therapy [facility] for tasks assessed/performed              Past Medical History:  Diagnosis Date   anal ca dx'd 11/2011   squamous cell carcinoma   Anxiety    Arthritis 2022   Depression    Full dentures    Hemorrhoids    external   History of radiation therapy 11/29/11 to 01/05/12   anal canal 5040 cGy 28 sessions, regional lymph nodes 4200 cGy 28 sessions   Hyperlipidemia    Lumbar radiculopathy 10/10/2019   Rectal bleeding    Staphylococcus aureus bacteremia with sepsis ([place])    UTI (urinary tract infection) 06/26/2021   Vitamin D deficiency    Wears glasses    Past Surgical History:  Procedure Laterality Date   ABDOMINAL HYSTERECTOMY  1978   age 46   BUBBLE STUDY  12/04/2020   Procedure: BUBBLE STUDY;  Surgeon: [name], MD;  Location[facility] ENDOSCOPY;  Service: Cardiology;;   EXAMINATION UNDER ANESTHESIA  06/21/2012   Procedure: EXAM UNDER ANESTHESIA;  Surgeon: [name], MD;  Location: [place];  Service: General;  Laterality: N/A;  rectal exam under anesthesia, anal dilation, rectal biopsy   [place]   Patient had to guess on the date.   RECTAL BIOPSY  06/21/2012   Procedure: BIOPSY RECTAL;  Surgeon: [name], MD;  Location: [place];  Service: General;  Laterality: N/A;   TEE WITHOUT CARDIOVERSION N/A 12/04/2020   Procedure: TRANSESOPHAGEAL ECHOCARDIOGRAM (TEE);  Surgeon: [name], MD;  Location: [place];  Service: Cardiology;  Laterality: N/A;   TOTAL KNEE ARTHROPLASTY Right 06/09/2021   Procedure: RIGHT TOTAL KNEE ARTHROPLASTY;  Surgeon: Meredith Pel, MD;  Location: [place];  Service: Orthopedics;  Laterality: Right;   TUBAL LIGATION     b/l  age 89   Patient Active Problem List   Diagnosis Date Noted   E coli bacteremia    Acute lower UTI    Sepsis due to undetermined organism ([place]) 09/19/2021   Hypokalemia 09/19/2021   Leukopenia 09/19/2021   Prolonged QT interval 09/19/2021   Arthritis of right knee    UTI (urinary tract infection) 06/26/2021   Severe sepsis ([place]) 17/49/4496   Acute metabolic encephalopathy 75/91/6384   Hypothyroid 06/25/2021   S/P total knee arthroplasty, right 06/09/2021   Fever    Staphylococcus aureus bacteremia with sepsis ([place])    SIRS (systemic inflammatory response syndrome) ([place]) 11/30/2020   Lumbar radiculopathy 10/10/2019   Bilateral stenosis of lateral recess of lumbar spine 10/10/2019   Hemorrhoids, external without complications 66/59/9357   Depression    Anxiety  Rectal bleeding    History of radiation therapy    Malignant neoplasm of anal canal ([place]) 11/15/2011   Hemorrhoids, internal, with bleeding 09/07/2011   Acquired anal stenosis 09/07/2011   Hyperlipidemia 09/07/2011   Family history of breast cancer in sister 09/07/2011    REFERRING DIAG: M54.16 (ICD-10-CM) - Lumbar radiculopathy  THERAPY DIAG:  Chronic pain of right knee  Stiffness of right knee, not elsewhere classified  Localized edema  Muscle weakness (generalized)  Difficulty walking  Other low back pain  Rationale for Evaluation and Treatment Rehabilitation  PERTINENT HISTORY: H/o Rt TKA, Anxiety, depression, hyperlipidemia, vit D deficiency, wears  glasses  PRECAUTIONS: Back  SUBJECTIVE:  Pt indicated back pain when standing/walking.  Sitting takes care of pain quickly.  Pt indicated Rt knee hurts most of the time with WB.   PAIN:  NPRS scale: back:  10/10 at times,  Rt knee:  10/10 Pain location: Low back and Rt knee Pain description: Ache, sore, can get sharp Aggravating factors: Prolonged standing and walking Relieving factors: Change of position, pain meds, back brace   OBJECTIVE: (objective measures completed at initial evaluation unless otherwise dated)   DIAGNOSTIC FINDINGS:  06/08/2022 review of chart:  IMPRESSION: 1. Status post right total knee arthroplasty without evidence of hardware loosening, acute fracture or dislocation. 2. Suspected rupture of the patellar tendon with resulting patella alta and pseudoarticulation between the patella and the distal femur. The patellar tendon is not well visualized on this examination due to artifact from the total knee arthroplasty. In this clinical context, ultrasound may be helpful to better assess the patellar tendon if clinically warranted.     IMPRESSION: 1. Progressive disc and facet degeneration at L3-4 with a new right foraminal disc protrusion. Severe right neural foraminal stenosis and moderate spinal stenosis. 2. Progressive, moderate spinal stenosis and moderate bilateral neural foraminal stenosis at L5-S1.   PATIENT SURVEYS:  06/08/22: FOTO 63% (predicted 67%)   SCREENING FOR RED FLAGS: 06/08/22 Bowel or bladder incontinence: No Spinal tumors: No Cauda equina syndrome: No Compression fracture: No Abdominal aneurysm: No   COGNITION:           06/08/22 Overall cognitive status: Within functional limits for tasks assessed                             SENSATION: 06/08/22: [facility]   MUSCLE LENGTH: 06/08/22 Hamstrings: Right 56 deg; Left 64 deg     POSTURE:  06/08/2022  rounded shoulders, forward head, decreased lumbar lordosis, increased thoracic kyphosis, flexed  trunk , and weight shift left   PALPATION: 06/08/2022  TTP: SI joint on the Rt, Rt knee joint line tenderness   LUMBAR ROM:      AROM  06/08/2022  Flexion 62  Extension 8  Right lateral flexion 10  Left lateral flexion 18  Right rotation Limited 75%  Left rotation Limited 75%   (Blank rows = not tested)   LOWER EXTREMITY ROM:      A= active P= passive  Right 06/08/2022 Left 06/08/2022  Hip flexion A: 90 A: 96  Hip extension A: 10 A: 15  Hip abduction A: 30 A: 35  Hip adduction      Hip internal rotation      Hip external rotation      Knee flexion A: 112 P: 115 A: 120 P: 122  Knee extension A: -6 P: -4 A: 0  Ankle dorsiflexion  Ankle plantarflexion      Ankle inversion      Ankle eversion       (Blank rows = not tested)   LOWER EXTREMITY MMT:     MMT Right 06/08/2022 Left 06/08/2022  Hip flexion 3/5 4/5  Hip extension 3/5 4/5  Hip abduction 3/5 4-/5  Hip adduction 3/5 4-/5  Hip internal rotation      Hip external rotation      Knee flexion      Knee extension      Ankle dorsiflexion      Ankle plantarflexion      Ankle inversion      Ankle eversion       (Blank rows = not tested)   LUMBAR SPECIAL TESTS:  06/08/2022 Straight leg raise test: Negative on Rt Slump test: negative bilateral   FUNCTIONAL TESTS:  06/08/2022 5 times sit to stand: 72 seconds with UE support   GAIT: 06/08/2022 Distance walked: 30 feet  Assistive device utilized: Single point cane Level of assistance: Modified independence Comments: lateral lean to left, decreased step length and foot clearance with forward flexed posture       TODAY'S TREATMENT  06/17/2022: Therex:  Review of existing HEP c cues for techniques  Supine lumbar trunk rotation 20 seconds x 3 bilateral  Supine quad set 5 second hold x 10 bilateral  SKC 20 sec x 3 bilateral  Seated 5 seconds scapular retraction x 10  Seated multifidi isometric hold UE lift up on table 5 sec hold x 10  Standing lateral shift  instruction and review.  Performed lateral shift correction in sitting x 5 (cues consistently during visit for correction of head/trunk lean to Lt) - cues given for use of mirror at home for feedback  Seated red band rows 2 x 10 bilateral     06/11/2022 Standing lumbar extension (shift correct 1st) 10X 3 seconds Shoulder blade pinches (SBP) 10X 5 seconds Sit to stand with SBP and lumbar extension when standing 2 sets of 5  Shift correction (push R hip to L and bring shoulders back to the R) 2 sets of 10 for 3 seconds Pelvic rotation 3X 20 seconds B Single knee to chest (other leg straight) 3X 20 seconds Supine quadriceps sets 10X 5 seconds Held SLR and seated knee extension due to significant quadriceps lag   06/08/22:  Reviewed pt's HEP and pt able to return demonstration with verbal cues and tactile assistance with each exercise.  Attempted bridge: pt unable to perform due to hamstring cramping SAQ: x 5 each LE Knee to chest x 2 holding 20 seconds Trunk rotation: x 2 holding 20 seconds LAQ: x 5 each LE holding 3 seconds       PATIENT EDUCATION:  06/08/2022 Education details: PT POC, HEP Person educated: Patient Education method: Consulting civil engineer, Demonstration, Tactile cues, Verbal cues, and Handouts Education comprehension: verbalized understanding, returned demonstration, verbal cues required, tactile cues required, and needs further education     HOME EXERCISE PROGRAM: Access Code: [email or web address] URL: [email or web address] Date: 06/11/2022 Prepared by: Vista Mink  Exercises - Supine Lower Trunk Rotation  - 2 x daily - 7 x weekly - 3 reps - 20 seconds hold - Supine Quad Set  - 5 x daily - 7 x weekly - 2 sets - 10 reps - 5 seconds hold - Left Standing Lateral Shift Correction at [place]  - 5 x daily - 7 x weekly - 1 sets - 5 reps - 3 seconds  hold - Standing Scapular Retraction  - 5 x daily - 7 x weekly - 1 sets - 5 reps - 5 second hold - Sit to Stand with  Armchair  - 2 x daily - 7 x weekly - 1 sets - 5 reps - Single Knee to Chest Stretch  - 2 x daily - 7 x weekly - 1 sets - 3 reps - 20 seconds hold   ASSESSMENT:   CLINICAL IMPRESSION: Extended time spent today in review of existing HEP and cues per Pt request to improve knowledge and performance techniques.  Continued presentation of low back and Rt knee pain primarily in WB activity that impair her functional ability.  Pt expressed desire to avoid surgery.  Continued skilled PT services indicated to help improve functional ability.      OBJECTIVE IMPAIRMENTS Abnormal gait, decreased activity tolerance, decreased balance, decreased endurance, decreased mobility, difficulty walking, decreased ROM, decreased strength, increased edema, impaired flexibility, postural dysfunction, and pain.    ACTIVITY LIMITATIONS carrying, lifting, bending, standing, squatting, sleeping, stairs, and transfers   PARTICIPATION LIMITATIONS: cleaning, laundry, shopping, community activity, and church   PERSONAL FACTORS 3+ comorbidities: h/o Rt TKA, anxiety, depression  are also affecting patient's functional outcome.    REHAB POTENTIAL: fair to good   CLINICAL DECISION MAKING: Stable/uncomplicated   EVALUATION COMPLEXITY: Low     GOALS: Goals reviewed with patient? Yes   SHORT TERM GOALS: Target date: 06/29/2022   Patient will demonstrate independent use of initial home exercise program to maintain progress from in clinic treatments. A. Goal status: on going - assessed 06/17/2022   LONG TERM GOALS: Target date: 07/23/22 Patient will demonstrate/report pain at worst less than or equal to 2/10 to facilitate minimal limitation in daily activity secondary to pain symptoms. Goal status: New   Patient will demonstrate independent use of home exercise program to facilitate ability to maintain/progress functional gains from skilled physical therapy services. Goal status: New   Patient will demonstrate FOTO outcome  > or = 67 % to indicate reduced disability due to condition. Goal status: New   Pt will improve her 5 time sit to stand to </= 30 seconds with UE support for improved functional mobility.                   A. Goal Status: New   5. Pt will improve her BERG balance assessment to >/= 35/56.                   A. Goal Status: New         PLAN: PT FREQUENCY: 1-2x/week   PT DURATION: 6 weeks   PLANNED INTERVENTIONS: Therapeutic exercises, Therapeutic activity, Neuromuscular re-education, Balance training, Gait training, Patient/Family education, Self Care, Joint mobilization, Stair training, Dry Needling, Electrical stimulation, Spinal mobilization, Cryotherapy, Moist heat, Traction, Ultrasound, Ionotophoresis '4mg'$ /ml Dexamethasone, and Manual therapy.   PLAN FOR NEXT SESSION:  Review HEP as needed per Pt request.  Continued efforts for progressive strengthening/endurance to improve functional activity tolerance, adjust based off symptoms.  Improve lateral shift for standing/walking.     Scot Jun, PT, DPT, OCS, ATC 06/17/22  11:25 AM

## 2022-06-21 ENCOUNTER — Other Ambulatory Visit: Payer: Self-pay

## 2022-06-21 NOTE — Patient Outreach (Signed)
[facility]) Care Management  06/21/2022  [name] 05-18-1946 [id number]   Telephone call to patient for nurse call. Discused [facility] services and patient agreeable.  Patient to call MD office about AWV.  No concerns.    Care Plan : RN CM Plan of Care  Updates made by Jon Billings, RN since 06/21/2022 12:00 AM     Problem: Chronic Disease Management and Care Coordination Needs Hyperlipidemia   Priority: High     Long-Range Goal: Development of Plan of Care for Management of Hyperlipidemia   Start Date: 06/21/2022  Expected End Date: 10/31/2022  Priority: High  Note:   Current Barriers:  Chronic Disease Management support and education needs related to HLD   RNCM Clinical Goal(s):  Patient will verbalize basic understanding of  HLD disease process and self health management plan as evidenced by Cholesterol less than 200  through collaboration with RN Care manager, provider, and care team.   Interventions: Education and support related to Hyperlipidemia Inter-disciplinary care team collaboration (see longitudinal plan of care) Evaluation of current treatment plan related to  self management and patient's adherence to plan as established by provider   Hyperlipidemia Interventions:  (Status:  New goal.) Long Term Goal Medication review performed; medication list updated in electronic medical record.  Counseled on importance of regular laboratory monitoring as prescribed Reviewed importance of limiting foods high in cholesterol  Patient Goals/Self-Care Activities: Take all medications as prescribed Attend all scheduled provider appointments take all medications exactly as prescribed call doctor with any symptoms you believe are related to your medicine  Follow Up Plan:  Telephone follow up appointment with care management team member scheduled for:  February The patient has been provided with contact information for the care management team and has been  advised to call with any health related questions or concerns. '    Plan: RN CM will provide ongoing education and support to patient through phone calls.   RN CM will send welcome packet with consent to patient.   RN CM will send initial barriers letter, assessment, and care plan to primary care physician.   RN CM will contact patient in February and patient agrees to next contact and care plan.  Jone Baseman, RN, MSN [facility] Care Management Care Management Coordinator Direct Line [phone] Toll Free: [phone]  Fax: [phone]

## 2022-06-21 NOTE — Patient Instructions (Signed)
Patient Goals/Self-Care Activities: Take all medications as prescribed Attend all scheduled provider appointments take all medications exactly as prescribed call doctor with any symptoms you believe are related to your medicine

## 2022-06-22 ENCOUNTER — Ambulatory Visit: Payer: Medicare PPO | Admitting: Physical Therapy

## 2022-06-22 ENCOUNTER — Encounter: Payer: Self-pay | Admitting: Physical Therapy

## 2022-06-22 DIAGNOSIS — M6281 Muscle weakness (generalized): Secondary | ICD-10-CM

## 2022-06-22 DIAGNOSIS — G8929 Other chronic pain: Secondary | ICD-10-CM | POA: Diagnosis not present

## 2022-06-22 DIAGNOSIS — R600 Localized edema: Secondary | ICD-10-CM

## 2022-06-22 DIAGNOSIS — M25561 Pain in right knee: Secondary | ICD-10-CM | POA: Diagnosis not present

## 2022-06-22 DIAGNOSIS — R262 Difficulty in walking, not elsewhere classified: Secondary | ICD-10-CM

## 2022-06-22 DIAGNOSIS — M25661 Stiffness of right knee, not elsewhere classified: Secondary | ICD-10-CM

## 2022-06-22 DIAGNOSIS — M5459 Other low back pain: Secondary | ICD-10-CM | POA: Diagnosis not present

## 2022-06-22 NOTE — Therapy (Signed)
OUTPATIENT PHYSICAL THERAPY TREATMENT NOTE RECERTIFICATION AND [facility]BMISSION   Patient Name: [name]: [id number] DOB:[birth year], 76 y.o., female Today's Date: 06/22/2022  PCP: [name], MD REFERRING PROVIDER: Meredith Pel, MD  END OF SESSION:        PT End of Session - 06/22/22 0933     Visit Number 4    Number of Visits 16    Date for PT Re-Evaluation 09/03/22    Authorization Type humana: 12 visits until 06/22/22    Authorization Time Period sent recert and [employer] cert on 04/22/28 for 12 additional visits.    Authorization - Visit Number 4    Authorization - Number of Visits 16    PT Start Time 0845    PT Stop Time 0929    PT Time Calculation (min) 44 min    Activity Tolerance Patient limited by pain;Patient limited by fatigue    Behavior During Therapy [employer] for tasks assessed/performed               Past Medical History:  Diagnosis Date   anal ca dx'd 11/2011   squamous cell carcinoma   Anxiety    Arthritis 2022   Depression    Full dentures    Hemorrhoids    external   History of radiation therapy 11/29/11 to 01/05/12   anal canal 5040 cGy 28 sessions, regional lymph nodes 4200 cGy 28 sessions   Hyperlipidemia    Lumbar radiculopathy 10/10/2019   Rectal bleeding    Staphylococcus aureus bacteremia with sepsis ([place])    UTI (urinary tract infection) 06/26/2021   Vitamin D deficiency    Wears glasses    Past Surgical History:  Procedure Laterality Date   ABDOMINAL HYSTERECTOMY  1978   age 40   BUBBLE STUDY  12/04/2020   Procedure: BUBBLE STUDY;  Surgeon: [name], MD;  Location: [facility] ENDOSCOPY;  Service: Cardiology;;   EXAMINATION UNDER ANESTHESIA  06/21/2012   Procedure: EXAM UNDER ANESTHESIA;  Surgeon: [name], MD;  Location: [place];  Service: General;  Laterality: N/A;  rectal exam under anesthesia, anal dilation, rectal biopsy   [place]   Patient had to guess on the date.    RECTAL BIOPSY  06/21/2012   Procedure: BIOPSY RECTAL;  Surgeon: [name], MD;  Location: [facility];  Service: General;  Laterality: N/A;   TEE WITHOUT CARDIOVERSION N/A 12/04/2020   Procedure: TRANSESOPHAGEAL ECHOCARDIOGRAM (TEE);  Surgeon: [name], MD;  Location: [place];  Service: Cardiology;  Laterality: N/A;   TOTAL KNEE ARTHROPLASTY Right 06/09/2021   Procedure: RIGHT TOTAL KNEE ARTHROPLASTY;  Surgeon: Meredith Pel, MD;  Location: [place];  Service: Orthopedics;  Laterality: Right;   TUBAL LIGATION     b/l  age 38   Patient Active Problem List   Diagnosis Date Noted   E coli bacteremia    Acute lower UTI    Sepsis due to undetermined organism ([place]) 09/19/2021   Hypokalemia 09/19/2021   Leukopenia 09/19/2021   Prolonged QT interval 09/19/2021   Arthritis of right knee    UTI (urinary tract infection) 06/26/2021   Severe sepsis ([place]) 79/89/2119   Acute metabolic encephalopathy 41/74/0814   Hypothyroid 06/25/2021   S/P total knee arthroplasty, right 06/09/2021   Fever    Staphylococcus aureus bacteremia with sepsis ([place])    SIRS (systemic inflammatory response syndrome) ([place]) 11/30/2020   Lumbar radiculopathy 10/10/2019   Bilateral stenosis of  lateral recess of lumbar spine 10/10/2019   Hemorrhoids, external without complications 47/42/5956   Depression    Anxiety    Rectal bleeding    History of radiation therapy    Malignant neoplasm of anal canal ([place]) 11/15/2011   Hemorrhoids, internal, with bleeding 09/07/2011   Acquired anal stenosis 09/07/2011   Hyperlipidemia 09/07/2011   Family history of breast cancer in sister 09/07/2011    REFERRING DIAG: M54.16 (ICD-10-CM) - Lumbar radiculopathy  THERAPY DIAG:  Chronic pain of right knee  Stiffness of right knee, not elsewhere classified  Localized edema  Muscle weakness (generalized)  Difficulty walking  Other low back pain  Rationale for Evaluation and Treatment  Rehabilitation  PERTINENT HISTORY: H/o Rt TKA, Anxiety, depression, hyperlipidemia, vit D deficiency, wears glasses  PRECAUTIONS: Back  SUBJECTIVE:  Pt indicated back pain when standing/walking.  Sitting takes care of pain quickly.  Pt indicated Rt knee hurts most of the time with WB.   PAIN:  NPRS scale: back:  10/10 at times,  Rt knee:  10/10 Pain location: Low back and Rt knee Pain description: Ache, sore, can get sharp Aggravating factors: Prolonged standing and walking Relieving factors: Change of position, pain meds, back brace   OBJECTIVE: (objective measures completed at initial evaluation unless otherwise dated)   DIAGNOSTIC FINDINGS:  06/08/2022 review of chart:  IMPRESSION: 1. Status post right total knee arthroplasty without evidence of hardware loosening, acute fracture or dislocation. 2. Suspected rupture of the patellar tendon with resulting patella alta and pseudoarticulation between the patella and the distal femur. The patellar tendon is not well visualized on this examination due to artifact from the total knee arthroplasty. In this clinical context, ultrasound may be helpful to better assess the patellar tendon if clinically warranted.     IMPRESSION: 1. Progressive disc and facet degeneration at L3-4 with a new right foraminal disc protrusion. Severe right neural foraminal stenosis and moderate spinal stenosis. 2. Progressive, moderate spinal stenosis and moderate bilateral neural foraminal stenosis at L5-S1.   PATIENT SURVEYS:  06/08/22: FOTO 63% (predicted 67%)   SCREENING FOR RED FLAGS: 06/08/22 Bowel or bladder incontinence: No Spinal tumors: No Cauda equina syndrome: No Compression fracture: No Abdominal aneurysm: No   COGNITION:           06/08/22 Overall cognitive status: Within functional limits for tasks assessed                             SENSATION: 06/08/22: [facility]   MUSCLE LENGTH: 06/08/22 Hamstrings: Right 56 deg; Left 64 deg     POSTURE:   06/08/2022  rounded shoulders, forward head, decreased lumbar lordosis, increased thoracic kyphosis, flexed trunk , and weight shift left   PALPATION: 06/08/2022  TTP: SI joint on the Rt, Rt knee joint line tenderness   LUMBAR ROM:      AROM  06/08/2022  Flexion 62  Extension 8  Right lateral flexion 10  Left lateral flexion 18  Right rotation Limited 75%  Left rotation Limited 75%   (Blank rows = not tested)   LOWER EXTREMITY ROM:      A= active P= passive  Right 06/08/2022 Left 06/08/2022  Hip flexion A: 90 A: 96  Hip extension A: 10 A: 15  Hip abduction A: 30 A: 35  Hip adduction      Hip internal rotation      Hip external rotation      Knee flexion A:  112 P: 115 A: 120 P: 122  Knee extension A: -6 P: -4 A: 0  Ankle dorsiflexion      Ankle plantarflexion      Ankle inversion      Ankle eversion       (Blank rows = not tested)   LOWER EXTREMITY MMT:     MMT Right 06/08/2022 Left 06/08/2022  Hip flexion 3/5 4/5  Hip extension 3/5 4/5  Hip abduction 3/5 4-/5  Hip adduction 3/5 4-/5  Hip internal rotation      Hip external rotation      Knee flexion      Knee extension      Ankle dorsiflexion      Ankle plantarflexion      Ankle inversion      Ankle eversion       (Blank rows = not tested)   LUMBAR SPECIAL TESTS:  06/08/2022 Straight leg raise test: Negative on Rt Slump test: negative bilateral   FUNCTIONAL TESTS:  06/08/2022 5 times sit to stand: 72 seconds with UE support   GAIT: 06/08/2022 Distance walked: 30 feet  Assistive device utilized: Single point cane Level of assistance: Modified independence Comments: lateral lean to left, decreased step length and foot clearance with forward flexed posture       TODAY'S TREATMENT  06/22/2022: Therex: Supine lumbar trunk rotation 20 seconds x 3 bilateral Supine quad set 5 second hold x 10 bilateral SKC 20 sec x 3 bilateral Seated 5 seconds scapular retraction x 10 Seated multifidi isometric hold UE lift up  on table 5 sec hold x 10  Performed lateral shift correction in sitting x 5 c verbal and tactile cues Seated red band rows 2 x 10 bilateral    06/17/2022: Therex:  Review of existing HEP c cues for techniques  Supine lumbar trunk rotation 20 seconds x 3 bilateral  Supine quad set 5 second hold x 10 bilateral  SKC 20 sec x 3 bilateral  Seated 5 seconds scapular retraction x 10  Seated multifidi isometric hold UE lift up on table 5 sec hold x 10  Standing lateral shift instruction and review.  Performed lateral shift correction in sitting x 5 (cues consistently during visit for correction of head/trunk lean to Lt) - cues given for use of mirror at home for feedback  Seated red band rows 2 x 10 bilateral     06/11/2022 Standing lumbar extension (shift correct 1st) 10X 3 seconds Shoulder blade pinches (SBP) 10X 5 seconds Sit to stand with SBP and lumbar extension when standing 2 sets of 5  Shift correction (push R hip to L and bring shoulders back to the R) 2 sets of 10 for 3 seconds Pelvic rotation 3X 20 seconds B Single knee to chest (other leg straight) 3X 20 seconds Supine quadriceps sets 10X 5 seconds Held SLR and seated knee extension due to significant quadriceps lag          PATIENT EDUCATION:  06/08/2022 Education details: PT POC, HEP Person educated: Patient Education method: Consulting civil engineer, Demonstration, Corporate treasurer cues, Verbal cues, and Handouts Education comprehension: verbalized understanding, returned demonstration, verbal cues required, tactile cues required, and needs further education     HOME EXERCISE PROGRAM: Access Code: [email or web address] URL: [email or web address] Date: 06/11/2022 Prepared by: Vista Mink  Exercises - Supine Lower Trunk Rotation  - 2 x daily - 7 x weekly - 3 reps - 20 seconds hold - Supine Quad Set  - 5 x daily - 7 x weekly -  2 sets - 10 reps - 5 seconds hold - Left Standing Lateral Shift Correction at Wall - Repetitions  - 5 x daily  - 7 x weekly - 1 sets - 5 reps - 3 seconds hold - Standing Scapular Retraction  - 5 x daily - 7 x weekly - 1 sets - 5 reps - 5 second hold - Sit to Stand with Armchair  - 2 x daily - 7 x weekly - 1 sets - 5 reps - Single Knee to Chest Stretch  - 2 x daily - 7 x weekly - 1 sets - 3 reps - 20 seconds hold   ASSESSMENT:   CLINICAL IMPRESSION: Pt again wishing to review and update her HEP. Pt feels like her overall strength is improving, however pt still reporting pain in right side low back. No goals met this session.   Continued skilled PT services indicated to help improve functional ability.      OBJECTIVE IMPAIRMENTS Abnormal gait, decreased activity tolerance, decreased balance, decreased endurance, decreased mobility, difficulty walking, decreased ROM, decreased strength, increased edema, impaired flexibility, postural dysfunction, and pain.    ACTIVITY LIMITATIONS carrying, lifting, bending, standing, squatting, sleeping, stairs, and transfers   PARTICIPATION LIMITATIONS: cleaning, laundry, shopping, community activity, and church   PERSONAL FACTORS 3+ comorbidities: h/o Rt TKA, anxiety, depression  are also affecting patient's functional outcome.    REHAB POTENTIAL: fair to good   CLINICAL DECISION MAKING: Stable/uncomplicated   EVALUATION COMPLEXITY: Low     GOALS: Goals reviewed with patient? Yes   SHORT TERM GOALS: Target date: 06/29/2022   Patient will demonstrate independent use of initial home exercise program to maintain progress from in clinic treatments. A. Goal status: MET  06/22/2022   LONG TERM GOALS: Target date: 08/06/22 Patient will demonstrate/report pain at worst less than or equal to 2/10 to facilitate minimal limitation in daily activity secondary to pain symptoms. Goal status: On-going 06/22/22   Patient will demonstrate independent use of home exercise program to facilitate ability to maintain/progress functional gains from skilled physical therapy  services. Goal status: On-going 06/22/22   Patient will demonstrate FOTO outcome > or = 67 % to indicate reduced disability due to condition. Goal status: On-going 06/22/22   Pt will improve her 5 time sit to stand to </= 30 seconds with UE support for improved functional mobility.                   A. Goal Status: On-going 06/22/22   5. Pt will improve her BERG balance assessment to >/= 35/56.                   A. Goal Status: On-going 06/22/22         PLAN: PT FREQUENCY: 1-2x/week   PT DURATION: 6 weeks   PLANNED INTERVENTIONS: Therapeutic exercises, Therapeutic activity, Neuromuscular re-education, Balance training, Gait training, Patient/Family education, Self Care, Joint mobilization, Stair training, Dry Needling, Electrical stimulation, Spinal mobilization, Cryotherapy, Moist heat, Traction, Ultrasound, Ionotophoresis 7m/ml Dexamethasone, and Manual therapy.   PLAN FOR NEXT SESSION:  Review HEP as needed per Pt request.  Continued efforts for progressive strengthening/endurance to improve functional activity tolerance, adjust based off symptoms.  Improve lateral shift for standing/walking.     JKearney Hard PT, MPT 06/22/22 1:59 PM   06/22/22  1:59 PM

## 2022-06-23 ENCOUNTER — Encounter: Payer: Self-pay | Admitting: Physical Therapy

## 2022-06-23 ENCOUNTER — Ambulatory Visit (INDEPENDENT_AMBULATORY_CARE_PROVIDER_SITE_OTHER): Payer: Medicare PPO | Admitting: Physical Therapy

## 2022-06-23 ENCOUNTER — Encounter: Payer: Medicare PPO | Admitting: Physical Therapy

## 2022-06-23 DIAGNOSIS — R262 Difficulty in walking, not elsewhere classified: Secondary | ICD-10-CM | POA: Diagnosis not present

## 2022-06-23 DIAGNOSIS — M25561 Pain in right knee: Secondary | ICD-10-CM | POA: Diagnosis not present

## 2022-06-23 DIAGNOSIS — M6281 Muscle weakness (generalized): Secondary | ICD-10-CM | POA: Diagnosis not present

## 2022-06-23 DIAGNOSIS — G8929 Other chronic pain: Secondary | ICD-10-CM

## 2022-06-23 DIAGNOSIS — M25661 Stiffness of right knee, not elsewhere classified: Secondary | ICD-10-CM | POA: Diagnosis not present

## 2022-06-23 DIAGNOSIS — R600 Localized edema: Secondary | ICD-10-CM | POA: Diagnosis not present

## 2022-06-23 DIAGNOSIS — M5459 Other low back pain: Secondary | ICD-10-CM | POA: Diagnosis not present

## 2022-06-23 NOTE — Therapy (Signed)
OUTPATIENT PHYSICAL THERAPY TREATMENT NOTE    Patient Name: [name]: [id number] DOB:12/15/1945, 76 y.o., female Today's Date: 06/23/2022  PCP: [name], MD REFERRING PROVIDER: Meredith Pel, MD  END OF SESSION:   PT End of Session - 06/23/22 1056     Visit Number 5    Number of Visits 16    Date for PT Re-Evaluation 09/03/22    Authorization Type humana: 12 visits until 06/22/22    Authorization Time Period sent recert and [employer] cert on 8/41/32 for 12 additional visits.    PT Start Time 1010    PT Stop Time 1053    PT Time Calculation (min) 43 min    Activity Tolerance Patient limited by pain;Patient limited by fatigue    Behavior During Therapy [facility] for tasks assessed/performed                Past Medical History:  Diagnosis Date   anal ca dx'd 11/2011   squamous cell carcinoma   Anxiety    Arthritis 2022   Depression    Full dentures    Hemorrhoids    external   History of radiation therapy 11/29/11 to 01/05/12   anal canal 5040 cGy 28 sessions, regional lymph nodes 4200 cGy 28 sessions   Hyperlipidemia    Lumbar radiculopathy 10/10/2019   Rectal bleeding    Staphylococcus aureus bacteremia with sepsis ([place])    UTI (urinary tract infection) 06/26/2021   Vitamin D deficiency    Wears glasses    Past Surgical History:  Procedure Laterality Date   ABDOMINAL HYSTERECTOMY  1978   age 74   BUBBLE STUDY  12/04/2020   Procedure: BUBBLE STUDY;  Surgeon: [name], MD;  Location: [facility] ENDOSCOPY;  Service: Cardiology;;   EXAMINATION UNDER ANESTHESIA  06/21/2012   Procedure: EXAM UNDER ANESTHESIA;  Surgeon: [name], MD;  Location: [place];  Service: General;  Laterality: N/A;  rectal exam under anesthesia, anal dilation, rectal biopsy   [place]   Patient had to guess on the date.   RECTAL BIOPSY  06/21/2012   Procedure: BIOPSY RECTAL;  Surgeon: [name], MD;  Location: [place];  Service: General;  Laterality: N/A;   TEE WITHOUT CARDIOVERSION N/A 12/04/2020   Procedure: TRANSESOPHAGEAL ECHOCARDIOGRAM (TEE);  Surgeon: [name], MD;  Location: [place];  Service: Cardiology;  Laterality: N/A;   TOTAL KNEE ARTHROPLASTY Right 06/09/2021   Procedure: RIGHT TOTAL KNEE ARTHROPLASTY;  Surgeon: Meredith Pel, MD;  Location: [place];  Service: Orthopedics;  Laterality: Right;   TUBAL LIGATION     b/l  age 70   Patient Active Problem List   Diagnosis Date Noted   E coli bacteremia    Acute lower UTI    Sepsis due to undetermined organism ([place]) 09/19/2021   Hypokalemia 09/19/2021   Leukopenia 09/19/2021   Prolonged QT interval 09/19/2021   Arthritis of right knee    UTI (urinary tract infection) 06/26/2021   Severe sepsis ([place]) 44/11/270   Acute metabolic encephalopathy 53/66/4403   Hypothyroid 06/25/2021   S/P total knee arthroplasty, right 06/09/2021   Fever    Staphylococcus aureus bacteremia with sepsis ([place])    SIRS (systemic inflammatory response syndrome) ([place]) 11/30/2020   Lumbar radiculopathy 10/10/2019   Bilateral stenosis of lateral recess of lumbar spine 10/10/2019   Hemorrhoids, external without complications 47/42/5956   Depression    Anxiety    Rectal  bleeding    History of radiation therapy    Malignant neoplasm of anal canal ([place]) 11/15/2011   Hemorrhoids, internal, with bleeding 09/07/2011   Acquired anal stenosis 09/07/2011   Hyperlipidemia 09/07/2011   Family history of breast cancer in sister 09/07/2011    REFERRING DIAG: M54.16 (ICD-10-CM) - Lumbar radiculopathy  THERAPY DIAG:  Chronic pain of right knee  Stiffness of right knee, not elsewhere classified  Localized edema  Muscle weakness (generalized)  Difficulty walking  Other low back pain  Rationale for Evaluation and Treatment Rehabilitation  PERTINENT HISTORY: H/o Rt TKA, Anxiety, depression, hyperlipidemia, vit D deficiency, wears  glasses  PRECAUTIONS: Back  SUBJECTIVE: Pt reporting 7/10 pain today in her low back with pain down her legs into her knees when standing. Pt stating pain is worse in the mornings.   PAIN:  NPRS scale: back:  7/10 in low back  Pain location: Low back Pain description: Ache, sore, can get sharp Aggravating factors: Prolonged standing and walking, when first getting up Relieving factors: Change of position, pain meds, back brace   OBJECTIVE: (objective measures completed at initial evaluation unless otherwise dated)   DIAGNOSTIC FINDINGS:  06/08/2022 review of chart:  IMPRESSION: 1. Status post right total knee arthroplasty without evidence of hardware loosening, acute fracture or dislocation. 2. Suspected rupture of the patellar tendon with resulting patella alta and pseudoarticulation between the patella and the distal femur. The patellar tendon is not well visualized on this examination due to artifact from the total knee arthroplasty. In this clinical context, ultrasound may be helpful to better assess the patellar tendon if clinically warranted.     IMPRESSION: 1. Progressive disc and facet degeneration at L3-4 with a new right foraminal disc protrusion. Severe right neural foraminal stenosis and moderate spinal stenosis. 2. Progressive, moderate spinal stenosis and moderate bilateral neural foraminal stenosis at L5-S1.   PATIENT SURVEYS:  06/08/22: FOTO 63% (predicted 67%)   SCREENING FOR RED FLAGS: 06/08/22 Bowel or bladder incontinence: No Spinal tumors: No Cauda equina syndrome: No Compression fracture: No Abdominal aneurysm: No   COGNITION:           06/08/22 Overall cognitive status: Within functional limits for tasks assessed                             SENSATION: 06/08/22: [facility]   MUSCLE LENGTH: 06/08/22 Hamstrings: Right 56 deg; Left 64 deg     POSTURE:  06/08/2022  rounded shoulders, forward head, decreased lumbar lordosis, increased thoracic kyphosis, flexed  trunk , and weight shift left   PALPATION: 06/08/2022  TTP: SI joint on the Rt, Rt knee joint line tenderness   LUMBAR ROM:      AROM  06/08/2022  Flexion 62  Extension 8  Right lateral flexion 10  Left lateral flexion 18  Right rotation Limited 75%  Left rotation Limited 75%   (Blank rows = not tested)   LOWER EXTREMITY ROM:      A= active P= passive  Right 06/08/2022 Left 06/08/2022  Hip flexion A: 90 A: 96  Hip extension A: 10 A: 15  Hip abduction A: 30 A: 35  Hip adduction      Hip internal rotation      Hip external rotation      Knee flexion A: 112 P: 115 A: 120 P: 122  Knee extension A: -6 P: -4 A: 0  Ankle dorsiflexion      Ankle  plantarflexion      Ankle inversion      Ankle eversion       (Blank rows = not tested)   LOWER EXTREMITY MMT:     MMT Right 06/08/2022 Left 06/08/2022  Hip flexion 3/5 4/5  Hip extension 3/5 4/5  Hip abduction 3/5 4-/5  Hip adduction 3/5 4-/5  Hip internal rotation      Hip external rotation      Knee flexion      Knee extension      Ankle dorsiflexion      Ankle plantarflexion      Ankle inversion      Ankle eversion       (Blank rows = not tested)   LUMBAR SPECIAL TESTS:  06/08/2022 Straight leg raise test: Negative on Rt Slump test: negative bilateral   FUNCTIONAL TESTS:  06/08/2022 5 times sit to stand: 72 seconds with UE support   GAIT: 06/08/2022 Distance walked: 30 feet  Assistive device utilized: Single point cane Level of assistance: Modified independence Comments: lateral lean to left, decreased step length and foot clearance with forward flexed posture       TODAY'S TREATMENT  06/22/2022: Therex: Nustep: level 3 x 7 minutes Lateral shift correction against wall in standing x 5 c verbal and tactile cues Lateral shift correction in parallel bars using the mirror for feedback x 10 NDT tactile cues Standing hip flexion in parallel bars x 20 c UE support Standing against wall with ball placed as lumbar support  while performing bil shoulder flexion x 10 c close supervision Calf raises x 15 in parallel bars c UE support Standing hip abd x 10 each LE in parallel bars c UE support Supine lumbar trunk rotation 20 seconds x 3 bilateral SKC 20 sec x 2 bilateral     06/22/2022: Therex: Supine lumbar trunk rotation 20 seconds x 3 bilateral Supine quad set 5 second hold x 10 bilateral SKC 20 sec x 3 bilateral Seated 5 seconds scapular retraction x 10 Seated multifidi isometric hold UE lift up on table 5 sec hold x 10  Performed lateral shift correction in sitting x 5 c verbal and tactile cues Seated red band rows 2 x 10 bilateral    06/17/2022: Therex:  Review of existing HEP c cues for techniques  Supine lumbar trunk rotation 20 seconds x 3 bilateral  Supine quad set 5 second hold x 10 bilateral  SKC 20 sec x 3 bilateral  Seated 5 seconds scapular retraction x 10  Seated multifidi isometric hold UE lift up on table 5 sec hold x 10  Standing lateral shift instruction and review.  Performed lateral shift correction in sitting x 5 (cues consistently during visit for correction of head/trunk lean to Lt) - cues given for use of mirror at home for feedback  Seated red band rows 2 x 10 bilateral              PATIENT EDUCATION:  06/08/2022 Education details: PT POC, HEP Person educated: Patient Education method: Consulting civil engineer, Demonstration, Tactile cues, Verbal cues, and Handouts Education comprehension: verbalized understanding, returned demonstration, verbal cues required, tactile cues required, and needs further education     HOME EXERCISE PROGRAM: Access Code: [email or web address] URL: [email or web address] Date: 06/11/2022 Prepared by: Vista Mink  Exercises - Supine Lower Trunk Rotation  - 2 x daily - 7 x weekly - 3 reps - 20 seconds hold - Supine Quad Set  - 5 x daily - 7 x weekly -  2 sets - 10 reps - 5 seconds hold - Left Standing Lateral Shift Correction at [place]   - 5 x daily - 7 x weekly - 1 sets - 5 reps - 3 seconds hold - Standing Scapular Retraction  - 5 x daily - 7 x weekly - 1 sets - 5 reps - 5 second hold - Sit to Stand with Armchair  - 2 x daily - 7 x weekly - 1 sets - 5 reps - Single Knee to Chest Stretch  - 2 x daily - 7 x weekly - 1 sets - 3 reps - 20 seconds hold   ASSESSMENT:   CLINICAL IMPRESSION: Pt arriving today reporting 7/10 pain in her low back. Treatment focusing on lateral shift correction as well as overall strength and posture. Pt tolerating exercises well with rest breaks as needed. Continued skilled PT services indicated to help improve functional ability.      OBJECTIVE IMPAIRMENTS Abnormal gait, decreased activity tolerance, decreased balance, decreased endurance, decreased mobility, difficulty walking, decreased ROM, decreased strength, increased edema, impaired flexibility, postural dysfunction, and pain.    ACTIVITY LIMITATIONS carrying, lifting, bending, standing, squatting, sleeping, stairs, and transfers   PARTICIPATION LIMITATIONS: cleaning, laundry, shopping, community activity, and church   PERSONAL FACTORS 3+ comorbidities: h/o Rt TKA, anxiety, depression  are also affecting patient's functional outcome.    REHAB POTENTIAL: fair to good   CLINICAL DECISION MAKING: Stable/uncomplicated   EVALUATION COMPLEXITY: Low     GOALS: Goals reviewed with patient? Yes   SHORT TERM GOALS: Target date: 06/29/2022   Patient will demonstrate independent use of initial home exercise program to maintain progress from in clinic treatments. A. Goal status: MET  06/22/2022   LONG TERM GOALS: Target date: 08/06/22 Patient will demonstrate/report pain at worst less than or equal to 2/10 to facilitate minimal limitation in daily activity secondary to pain symptoms. Goal status: On-going 06/23/22   Patient will demonstrate independent use of home exercise program to facilitate ability to maintain/progress functional gains from  skilled physical therapy services. Goal status: On-going 06/23/22   Patient will demonstrate FOTO outcome > or = 67 % to indicate reduced disability due to condition. Goal status: On-going 06/23/22   Pt will improve her 5 time sit to stand to </= 30 seconds with UE support for improved functional mobility.                   A. Goal Status: On-going 06/23/22   5. Pt will improve her BERG balance assessment to >/= 35/56.                   A. Goal Status: On-going 06/23/22         PLAN: PT FREQUENCY: 1-2x/week   PT DURATION: 6 weeks   PLANNED INTERVENTIONS: Therapeutic exercises, Therapeutic activity, Neuromuscular re-education, Balance training, Gait training, Patient/Family education, Self Care, Joint mobilization, Stair training, Dry Needling, Electrical stimulation, Spinal mobilization, Cryotherapy, Moist heat, Traction, Ultrasound, Ionotophoresis 23m/ml Dexamethasone, and Manual therapy.   PLAN FOR NEXT SESSION:    Continued efforts for progressive strengthening/endurance to improve functional activity tolerance, adjust based off symptoms.  Improve lateral shift for standing/walking.     JKearney Hard PT, MPT 06/23/22 10:59 AM   06/23/22  10:59 AM

## 2022-06-29 ENCOUNTER — Encounter: Payer: Medicare PPO | Admitting: Physical Therapy

## 2022-06-30 ENCOUNTER — Ambulatory Visit (INDEPENDENT_AMBULATORY_CARE_PROVIDER_SITE_OTHER): Payer: Medicare PPO | Admitting: Physical Therapy

## 2022-06-30 ENCOUNTER — Encounter: Payer: Self-pay | Admitting: Physical Therapy

## 2022-06-30 DIAGNOSIS — R262 Difficulty in walking, not elsewhere classified: Secondary | ICD-10-CM | POA: Diagnosis not present

## 2022-06-30 DIAGNOSIS — G8929 Other chronic pain: Secondary | ICD-10-CM

## 2022-06-30 DIAGNOSIS — M25561 Pain in right knee: Secondary | ICD-10-CM

## 2022-06-30 DIAGNOSIS — R600 Localized edema: Secondary | ICD-10-CM

## 2022-06-30 DIAGNOSIS — M25661 Stiffness of right knee, not elsewhere classified: Secondary | ICD-10-CM | POA: Diagnosis not present

## 2022-06-30 DIAGNOSIS — M5459 Other low back pain: Secondary | ICD-10-CM

## 2022-06-30 DIAGNOSIS — M6281 Muscle weakness (generalized): Secondary | ICD-10-CM | POA: Diagnosis not present

## 2022-06-30 NOTE — Therapy (Signed)
OUTPATIENT PHYSICAL THERAPY TREATMENT NOTE    Patient Name: [name]: [id number] DOB:1946/04/02, 76 y.o., female Today's Date: 06/30/2022  PCP: [name], MD REFERRING PROVIDER: Meredith Pel, MD  END OF SESSION:   PT End of Session - 06/30/22 0907     Visit Number 6    Number of Visits 16    Date for PT Re-Evaluation 09/03/22    Authorization Type humana: 12 visits until 06/22/22    Authorization Time Period sent recert and [employer] cert on 9/51/88 for 12 additional visits.    Authorization - Visit Number 5    Authorization - Number of Visits 16    Progress Note Due on Visit 10    PT Start Time [phone]    PT Stop Time 0930    PT Time Calculation (min) 39 min    Activity Tolerance Patient limited by pain;Patient limited by fatigue    Behavior During Therapy [facility] for tasks assessed/performed                 Past Medical History:  Diagnosis Date   anal ca dx'd 11/2011   squamous cell carcinoma   Anxiety    Arthritis 2022   Depression    Full dentures    Hemorrhoids    external   History of radiation therapy 11/29/11 to 01/05/12   anal canal 5040 cGy 28 sessions, regional lymph nodes 4200 cGy 28 sessions   Hyperlipidemia    Lumbar radiculopathy 10/10/2019   Rectal bleeding    Staphylococcus aureus bacteremia with sepsis ([place])    UTI (urinary tract infection) 06/26/2021   Vitamin D deficiency    Wears glasses    Past Surgical History:  Procedure Laterality Date   ABDOMINAL HYSTERECTOMY  1978   age 33   BUBBLE STUDY  12/04/2020   Procedure: BUBBLE STUDY;  Surgeon: [name], MD;  Location: [facility] ENDOSCOPY;  Service: Cardiology;;   EXAMINATION UNDER ANESTHESIA  06/21/2012   Procedure: EXAM UNDER ANESTHESIA;  Surgeon: [name], MD;  Location: [place];  Service: General;  Laterality: N/A;  rectal exam under anesthesia, anal dilation, rectal biopsy   [place]   Patient had to guess on the date.   RECTAL  BIOPSY  06/21/2012   Procedure: BIOPSY RECTAL;  Surgeon: [name], MD;  Location: [place];  Service: General;  Laterality: N/A;   TEE WITHOUT CARDIOVERSION N/A 12/04/2020   Procedure: TRANSESOPHAGEAL ECHOCARDIOGRAM (TEE);  Surgeon: [name], MD;  Location: [place];  Service: Cardiology;  Laterality: N/A;   TOTAL KNEE ARTHROPLASTY Right 06/09/2021   Procedure: RIGHT TOTAL KNEE ARTHROPLASTY;  Surgeon: Meredith Pel, MD;  Location: [place];  Service: Orthopedics;  Laterality: Right;   TUBAL LIGATION     b/l  age 76   Patient Active Problem List   Diagnosis Date Noted   E coli bacteremia    Acute lower UTI    Sepsis due to undetermined organism ([place]) 09/19/2021   Hypokalemia 09/19/2021   Leukopenia 09/19/2021   Prolonged QT interval 09/19/2021   Arthritis of right knee    UTI (urinary tract infection) 06/26/2021   Severe sepsis ([place]) 04/30/1600   Acute metabolic encephalopathy 09/32/3557   Hypothyroid 06/25/2021   S/P total knee arthroplasty, right 06/09/2021   Fever    Staphylococcus aureus bacteremia with sepsis ([place])    SIRS (systemic inflammatory response syndrome) ([place]) 11/30/2020   Lumbar radiculopathy 10/10/2019  Bilateral stenosis of lateral recess of lumbar spine 10/10/2019   Hemorrhoids, external without complications 78/29/5621   Depression    Anxiety    Rectal bleeding    History of radiation therapy    Malignant neoplasm of anal canal ([place]) 11/15/2011   Hemorrhoids, internal, with bleeding 09/07/2011   Acquired anal stenosis 09/07/2011   Hyperlipidemia 09/07/2011   Family history of breast cancer in sister 09/07/2011    REFERRING DIAG: M54.16 (ICD-10-CM) - Lumbar radiculopathy  THERAPY DIAG:  Chronic pain of right knee  Stiffness of right knee, not elsewhere classified  Localized edema  Muscle weakness (generalized)  Difficulty walking  Other low back pain  Rationale for Evaluation and Treatment  Rehabilitation  PERTINENT HISTORY: H/o Rt TKA, Anxiety, depression, hyperlipidemia, vit D deficiency, wears glasses  PRECAUTIONS: Back  SUBJECTIVE: Pt reporting 8-9/10 pain today in her low back.   PAIN:  NPRS scale: back:  8-9/10 in low back  Pain location: Low back Pain description: Ache, sore, can get sharp Aggravating factors: Prolonged standing and walking, when first getting up Relieving factors: Change of position, pain meds, back brace   OBJECTIVE: (objective measures completed at initial evaluation unless otherwise dated)   DIAGNOSTIC FINDINGS:  06/08/2022 review of chart:  IMPRESSION: 1. Status post right total knee arthroplasty without evidence of hardware loosening, acute fracture or dislocation. 2. Suspected rupture of the patellar tendon with resulting patella alta and pseudoarticulation between the patella and the distal femur. The patellar tendon is not well visualized on this examination due to artifact from the total knee arthroplasty. In this clinical context, ultrasound may be helpful to better assess the patellar tendon if clinically warranted.     IMPRESSION: 1. Progressive disc and facet degeneration at L3-4 with a new right foraminal disc protrusion. Severe right neural foraminal stenosis and moderate spinal stenosis. 2. Progressive, moderate spinal stenosis and moderate bilateral neural foraminal stenosis at L5-S1.   PATIENT SURVEYS:  06/08/22: FOTO 63% (predicted 67%)   SCREENING FOR RED FLAGS: 06/08/22 Bowel or bladder incontinence: No Spinal tumors: No Cauda equina syndrome: No Compression fracture: No Abdominal aneurysm: No   COGNITION:           06/08/22 Overall cognitive status: Within functional limits for tasks assessed                             SENSATION: 06/08/22: [facility]   MUSCLE LENGTH: 06/08/22 Hamstrings: Right 56 deg; Left 64 deg     POSTURE:  06/08/2022  rounded shoulders, forward head, decreased lumbar lordosis, increased thoracic  kyphosis, flexed trunk , and weight shift left   PALPATION: 06/08/2022  TTP: SI joint on the Rt, Rt knee joint line tenderness   LUMBAR ROM:      AROM  06/08/2022 06/30/22  Flexion 62 60  Extension 8 10  Right lateral flexion 10 12  Left lateral flexion 18 20  Right rotation Limited 75% Limited 75%  Left rotation Limited 75% Limited 75%   (Blank rows = not tested)   LOWER EXTREMITY ROM:      A= active P= passive  Right 06/08/2022 Left 06/08/2022  Hip flexion A: 90 A: 96  Hip extension A: 10 A: 15  Hip abduction A: 30 A: 35  Hip adduction      Hip internal rotation      Hip external rotation      Knee flexion A: 112 P: 115 A: 120 P: 122  Knee extension A: -6 P: -4 A: 0  Ankle dorsiflexion      Ankle plantarflexion      Ankle inversion      Ankle eversion       (Blank rows = not tested)   LOWER EXTREMITY MMT:     MMT Right 06/08/2022 Left 06/08/2022  Hip flexion 3/5 4/5  Hip extension 3/5 4/5  Hip abduction 3/5 4-/5  Hip adduction 3/5 4-/5  Hip internal rotation      Hip external rotation      Knee flexion      Knee extension      Ankle dorsiflexion      Ankle plantarflexion      Ankle inversion      Ankle eversion       (Blank rows = not tested)   LUMBAR SPECIAL TESTS:  06/08/2022 Straight leg raise test: Negative on Rt Slump test: negative bilateral   FUNCTIONAL TESTS:  06/08/2022 : 5 times sit to stand: 72 seconds with UE support   06/30/22: 5 time sit to stand:  22 sec with UE support    GAIT: 06/08/2022 Distance walked: 30 feet  Assistive device utilized: Single point cane Level of assistance: Modified independence Comments: lateral lean to left, decreased step length and foot clearance with forward flexed posture       TODAY'S TREATMENT  06/30/2022: Therex: Scifit recumbent bike level 1 x 5 minutes Standing hip flexion x 20 c UE support Standing on yoga block and performing heel taps with opposite foot x 12  Calf raises x 15  c UE support Standing  hip abd x 10 each LE  c UE support Supine lumbar trunk rot bilateral x 3 holding 20 sec  We attempted to place pt's st cane in her Rt UE to decrease her lateral lean to the left. Pt wasn't able to respond well to new sequencing and unable to shift weight over her device.   06/22/2022: Therex: Nustep: level 3 x 7 minutes Lateral shift correction against wall in standing x 5 c verbal and tactile cues Lateral shift correction in parallel bars using the mirror for feedback x 10 NDT tactile cues Standing hip flexion in parallel bars x 20 c UE support Standing against wall with ball placed as lumbar support while performing bil shoulder flexion x 10 c close supervision Calf raises x 15 in parallel bars c UE support Standing hip abd x 10 each LE in parallel bars c UE support Supine lumbar trunk rotation 20 seconds x 3 bilateral SKTC 20 sec x 2 bilateral     06/22/2022: Therex: Supine lumbar trunk rotation 20 seconds x 3 bilateral Supine quad set 5 second hold x 10 bilateral SKC 20 sec x 3 bilateral Seated 5 seconds scapular retraction x 10 Seated multifidi isometric hold UE lift up on table 5 sec hold x 10  Performed lateral shift correction in sitting x 5 c verbal and tactile cues Seated red band rows 2 x 10 bilateral               PATIENT EDUCATION:  06/08/2022 Education details: PT POC, HEP Person educated: Patient Education method: Consulting civil engineer, Demonstration, Corporate treasurer cues, Verbal cues, and Handouts Education comprehension: verbalized understanding, returned demonstration, verbal cues required, tactile cues required, and needs further education     HOME EXERCISE PROGRAM: Access Code: [email or web address] URL: [email or web address] Date: 06/11/2022 Prepared by: Vista Mink  Exercises - Supine Lower Trunk Rotation  - 2 x daily - 7  x weekly - 3 reps - 20 seconds hold - Supine Quad Set  - 5 x daily - 7 x weekly - 2 sets - 10 reps - 5 seconds hold - Left Standing Lateral  Shift Correction at Wall - Repetitions  - 5 x daily - 7 x weekly - 1 sets - 5 reps - 3 seconds hold - Standing Scapular Retraction  - 5 x daily - 7 x weekly - 1 sets - 5 reps - 5 second hold - Sit to Stand with Armchair  - 2 x daily - 7 x weekly - 1 sets - 5 reps - Single Knee to Chest Stretch  - 2 x daily - 7 x weekly - 1 sets - 3 reps - 20 seconds hold   ASSESSMENT:   CLINICAL IMPRESSION: Pt has improved her 5 time sit to stand to 22 seconds using UE support. Pt tolerating strengthening exercises well. Pt stating Dr. Marlou Sa feels like if pt does not improve by October than he is recommending surgery for pt but pt states she doesn't want to go through another surgery if she can help it. We will continue to progress pt's overall functional mobility and strength as pt tolerates with skilled interventions.    Continued skilled PT services indicated to help improve functional ability.      OBJECTIVE IMPAIRMENTS Abnormal gait, decreased activity tolerance, decreased balance, decreased endurance, decreased mobility, difficulty walking, decreased ROM, decreased strength, increased edema, impaired flexibility, postural dysfunction, and pain.    ACTIVITY LIMITATIONS carrying, lifting, bending, standing, squatting, sleeping, stairs, and transfers   PARTICIPATION LIMITATIONS: cleaning, laundry, shopping, community activity, and church   PERSONAL FACTORS 3+ comorbidities: h/o Rt TKA, anxiety, depression  are also affecting patient's functional outcome.    REHAB POTENTIAL: fair to good   CLINICAL DECISION MAKING: Stable/uncomplicated   EVALUATION COMPLEXITY: Low     GOALS: Goals reviewed with patient? Yes   SHORT TERM GOALS: Target date: 06/29/2022   Patient will demonstrate independent use of initial home exercise program to maintain progress from in clinic treatments. A. Goal status: MET  06/22/2022   LONG TERM GOALS: Target date: 08/06/22 Patient will demonstrate/report pain at worst less  than or equal to 2/10 to facilitate minimal limitation in daily activity secondary to pain symptoms. Goal status: On-going 06/23/22   Patient will demonstrate independent use of home exercise program to facilitate ability to maintain/progress functional gains from skilled physical therapy services. Goal status: On-going 06/23/22   Patient will demonstrate FOTO outcome > or = 67 % to indicate reduced disability due to condition. Goal status: On-going 06/23/22   Pt will improve her 5 time sit to stand to </= 30 seconds with UE support for improved functional mobility.                   A. Goal Status: MET 06/30/22   5. Pt will improve her BERG balance assessment to >/= 35/56.                   A. Goal Status: On-going 06/23/22         PLAN: PT FREQUENCY: 1-2x/week   PT DURATION: 6 weeks   PLANNED INTERVENTIONS: Therapeutic exercises, Therapeutic activity, Neuromuscular re-education, Balance training, Gait training, Patient/Family education, Self Care, Joint mobilization, Stair training, Dry Needling, Electrical stimulation, Spinal mobilization, Cryotherapy, Moist heat, Traction, Ultrasound, Ionotophoresis 30m/ml Dexamethasone, and Manual therapy.   PLAN FOR NEXT SESSION:    Continued efforts for progressive strengthening/endurance to  improve functional activity tolerance. Improve lateral shift for standing/walking.     Kearney Hard, PT, MPT 06/30/22 9:13 AM   06/30/22  9:13 AM

## 2022-07-01 ENCOUNTER — Ambulatory Visit (INDEPENDENT_AMBULATORY_CARE_PROVIDER_SITE_OTHER): Payer: Medicare PPO | Admitting: Rehabilitative and Restorative Service Providers"

## 2022-07-01 ENCOUNTER — Encounter: Payer: Medicare PPO | Admitting: Rehabilitative and Restorative Service Providers"

## 2022-07-01 ENCOUNTER — Other Ambulatory Visit: Payer: Self-pay

## 2022-07-01 ENCOUNTER — Encounter: Payer: Self-pay | Admitting: Rehabilitative and Restorative Service Providers"

## 2022-07-01 DIAGNOSIS — M6281 Muscle weakness (generalized): Secondary | ICD-10-CM | POA: Diagnosis not present

## 2022-07-01 DIAGNOSIS — R262 Difficulty in walking, not elsewhere classified: Secondary | ICD-10-CM | POA: Diagnosis not present

## 2022-07-01 DIAGNOSIS — M5459 Other low back pain: Secondary | ICD-10-CM

## 2022-07-01 NOTE — Patient Outreach (Signed)
[facility]) Care Management  07/01/2022  [name] 07/07/1946 [id number]   RN CM closing case:  pt will be followed for care coordination by [place] [facility].  Jone Baseman, RN, MSN [facility] Care Management Care Management Coordinator Direct Line [phone] Toll Free: [phone]  Fax: [phone]

## 2022-07-01 NOTE — Therapy (Signed)
OUTPATIENT PHYSICAL THERAPY TREATMENT NOTE    Patient Name: [name]: [id number] DOB:[birth year], 76 y.o., female Today's Date: 07/01/2022  PCP: [name], MD REFERRING PROVIDER: Meredith Pel, MD  END OF SESSION:   PT End of Session - 07/01/22 0847     Visit Number 7    Number of Visits 16    Date for PT Re-Evaluation 09/03/22    Authorization Type humana: 12 visits until 06/22/22    Authorization Time Period sent recert and [employer] cert on 6/83/41 for 12 additional visits.    Authorization - Visit Number 7    Authorization - Number of Visits 16    Progress Note Due on Visit 10    PT Start Time 9622    PT Stop Time 0924    PT Time Calculation (min) 40 min    Activity Tolerance Patient limited by fatigue;Patient tolerated treatment well    Behavior During Therapy [facility] for tasks assessed/performed             Past Medical History:  Diagnosis Date   anal ca dx'd 11/2011   squamous cell carcinoma   Anxiety    Arthritis 2022   Depression    Full dentures    Hemorrhoids    external   History of radiation therapy 11/29/11 to 01/05/12   anal canal 5040 cGy 28 sessions, regional lymph nodes 4200 cGy 28 sessions   Hyperlipidemia    Lumbar radiculopathy 10/10/2019   Rectal bleeding    Staphylococcus aureus bacteremia with sepsis ([place])    UTI (urinary tract infection) 06/26/2021   Vitamin D deficiency    Wears glasses    Past Surgical History:  Procedure Laterality Date   ABDOMINAL HYSTERECTOMY  1978   age 67   [place]  12/04/2020   Procedure: BUBBLE STUDY;  Surgeon: [name], MD;  Location: [place];  Service: Cardiology;;   EXAMINATION UNDER ANESTHESIA  06/21/2012   Procedure: EXAM UNDER ANESTHESIA;  Surgeon: [name], MD;  Location: [place];  Service: General;  Laterality: N/A;  rectal exam under anesthesia, anal dilation, rectal biopsy   [employer]   Patient had to guess on the date.   RECTAL  BIOPSY  06/21/2012   Procedure: BIOPSY RECTAL;  Surgeon: [name], MD;  Location: [place];  Service: General;  Laterality: N/A;   TEE WITHOUT CARDIOVERSION N/A 12/04/2020   Procedure: TRANSESOPHAGEAL ECHOCARDIOGRAM (TEE);  Surgeon: [name], MD;  Location: [place];  Service: Cardiology;  Laterality: N/A;   TOTAL KNEE ARTHROPLASTY Right 06/09/2021   Procedure: RIGHT TOTAL KNEE ARTHROPLASTY;  Surgeon: Meredith Pel, MD;  Location: [place];  Service: Orthopedics;  Laterality: Right;   TUBAL LIGATION     b/l  age 82   Patient Active Problem List   Diagnosis Date Noted   E coli bacteremia    Acute lower UTI    Sepsis due to undetermined organism ([place]) 09/19/2021   Hypokalemia 09/19/2021   Leukopenia 09/19/2021   Prolonged QT interval 09/19/2021   Arthritis of right knee    UTI (urinary tract infection) 06/26/2021   Severe sepsis ([place]) 29/79/8921   Acute metabolic encephalopathy 19/41/7408   Hypothyroid 06/25/2021   S/P total knee arthroplasty, right 06/09/2021   Fever    Staphylococcus aureus bacteremia with sepsis ([place])    SIRS (systemic inflammatory response syndrome) ([place]) 11/30/2020   Lumbar radiculopathy 10/10/2019   Bilateral stenosis of lateral  recess of lumbar spine 10/10/2019   Hemorrhoids, external without complications 05/69/7948   Depression    Anxiety    Rectal bleeding    History of radiation therapy    Malignant neoplasm of anal canal ([place]) 11/15/2011   Hemorrhoids, internal, with bleeding 09/07/2011   Acquired anal stenosis 09/07/2011   Hyperlipidemia 09/07/2011   Family history of breast cancer in sister 09/07/2011    REFERRING DIAG: M54.16 (ICD-10-CM) - Lumbar radiculopathy  THERAPY DIAG:  Difficulty walking  Muscle weakness (generalized)  Other low back pain  Rationale for Evaluation and Treatment Rehabilitation  PERTINENT HISTORY: H/o Rt TKA, Anxiety, depression, hyperlipidemia, vit D deficiency, wears  glasses  PRECAUTIONS: Back  SUBJECTIVE: This week 5-8/10 pain in her low back.  Jesiah notes she is straighter than the last time I saw her, although endurance with WB activities is still very limited.  PAIN:  NPRS scale: back:  5-8/10 in low back  Pain location: Low back Pain description: Throbbing, ache, sore, can get sharp Aggravating factors: Prolonged standing and walking, when first getting up Relieving factors: Change of position, pain meds, back brace   OBJECTIVE: (objective measures completed at initial evaluation unless otherwise dated)   DIAGNOSTIC FINDINGS:  06/08/2022 review of chart:  IMPRESSION: 1. Status post right total knee arthroplasty without evidence of hardware loosening, acute fracture or dislocation. 2. Suspected rupture of the patellar tendon with resulting patella alta and pseudoarticulation between the patella and the distal femur. The patellar tendon is not well visualized on this examination due to artifact from the total knee arthroplasty. In this clinical context, ultrasound may be helpful to better assess the patellar tendon if clinically warranted.     IMPRESSION: 1. Progressive disc and facet degeneration at L3-4 with a new right foraminal disc protrusion. Severe right neural foraminal stenosis and moderate spinal stenosis. 2. Progressive, moderate spinal stenosis and moderate bilateral neural foraminal stenosis at L5-S1.   PATIENT SURVEYS:  06/08/22: FOTO 63% (predicted 67%)   SCREENING FOR RED FLAGS: 06/08/22 Bowel or bladder incontinence: No Spinal tumors: No Cauda equina syndrome: No Compression fracture: No Abdominal aneurysm: No   COGNITION:           06/08/22 Overall cognitive status: Within functional limits for tasks assessed                             SENSATION: 06/08/22: [facility]   MUSCLE LENGTH: 06/08/22 Hamstrings: Right 56 deg; Left 64 deg     POSTURE:  06/08/2022  rounded shoulders, forward head, decreased lumbar lordosis,  increased thoracic kyphosis, flexed trunk , and weight shift left   PALPATION: 06/08/2022  TTP: SI joint on the Rt, Rt knee joint line tenderness   LUMBAR ROM:      AROM  06/08/2022 06/30/22  Flexion 62 60  Extension 8 10  Right lateral flexion 10 12  Left lateral flexion 18 20  Right rotation Limited 75% Limited 75%  Left rotation Limited 75% Limited 75%   (Blank rows = not tested)   LOWER EXTREMITY ROM:      A= active P= passive  Right 06/08/2022 Left 06/08/2022  Hip flexion A: 90 A: 96  Hip extension A: 10 A: 15  Hip abduction A: 30 A: 35  Hip adduction      Hip internal rotation      Hip external rotation      Knee flexion A: 112 P: 115 A: 120 P: 122  Knee extension A: -6 P: -4 A: 0  Ankle dorsiflexion      Ankle plantarflexion      Ankle inversion      Ankle eversion       (Blank rows = not tested)   LOWER EXTREMITY MMT:     MMT Right 06/08/2022 Left 06/08/2022  Hip flexion 3/5 4/5  Hip extension 3/5 4/5  Hip abduction 3/5 4-/5  Hip adduction 3/5 4-/5  Hip internal rotation      Hip external rotation      Knee flexion      Knee extension      Ankle dorsiflexion      Ankle plantarflexion      Ankle inversion      Ankle eversion       (Blank rows = not tested)   LUMBAR SPECIAL TESTS:  06/08/2022 Straight leg raise test: Negative on Rt Slump test: negative bilateral   FUNCTIONAL TESTS:  06/08/2022 : 5 times sit to stand: 72 seconds with UE support   06/30/22: 5 time sit to stand:  22 sec with UE support    GAIT: 06/08/2022 Distance walked: 30 feet  Assistive device utilized: Single point cane Level of assistance: Modified independence Comments: lateral lean to left, decreased step length and foot clearance with forward flexed posture       TODAY'S TREATMENT  07/01/2022: Standing lumbar extension (shift correct 1st) 10X 3 seconds Shoulder blade pinches (SBP) 10X 5 seconds Shift correction (push R hip to L and bring shoulders back to the R) 2 sets of 10  for 3 seconds SciFit Bike push with legs and pull with UE 5 minutes Level 4.5 for 5 minutes Heel Toe Raises with pelvic stabilization 10X 3 seconds  Functional Activities for sit to stand and stairs: Leg Press 50# slow eccentrics 10X (B LE) Sit to stand with SBP and lumbar extension when standing 2 sets of 5 (for postural correction with standing, gait, sit to stand and stairs)   06/30/2022: Therex: Scifit recumbent bike level 1 x 5 minutes Standing hip flexion x 20 c UE support Standing on yoga block and performing heel taps with opposite foot x 12  Calf raises x 15  c UE support Standing hip abd x 10 each LE  c UE support Supine lumbar trunk rot bilateral x 3 holding 20 sec  We attempted to place pt's st cane in her Rt UE to decrease her lateral lean to the left. Pt wasn't able to respond well to new sequencing and unable to shift weight over her device.    06/22/2022: Therex: Nustep: level 3 x 7 minutes Lateral shift correction against wall in standing x 5 c verbal and tactile cues Lateral shift correction in parallel bars using the mirror for feedback x 10 NDT tactile cues Standing hip flexion in parallel bars x 20 c UE support Standing against wall with ball placed as lumbar support while performing bil shoulder flexion x 10 c close supervision Calf raises x 15 in parallel bars c UE support Standing hip abd x 10 each LE in parallel bars c UE support Supine lumbar trunk rotation 20 seconds x 3 bilateral SKTC 20 sec x 2 bilateral      PATIENT EDUCATION:  06/08/2022 Education details: PT POC, HEP Person educated: Patient Education method: Consulting civil engineer, Demonstration, Corporate treasurer cues, Verbal cues, and Handouts Education comprehension: verbalized understanding, returned demonstration, verbal cues required, tactile cues required, and needs further education     HOME EXERCISE  PROGRAM: Access Code: [email or web address] URL: [email or web address] Date: 07/01/2022 Prepared by:  Vista Mink  Exercises - Standing Scapular Retraction  - 5 x daily - 7 x weekly - 1 sets - 5 reps - 5 second hold - Sit to Stand with Armchair  - 5 x daily - 7 x weekly - 1 sets - 5 reps - Standing Lumbar Extension at [place]  - 5 x daily - 7 x weekly - 1 sets - 5 reps - 3 seconds hold    ASSESSMENT:   CLINICAL IMPRESSION: Yury is straighter than the last time I saw her 20 days ago.  She still very much leans to the L and correcting her lateral shift, strengthening her low back and postural muscles along with strengthening B quadriceps remain the #1 priorities.  Her prognosis remains good although she will likely need a prolonged rehabilitation to meet LTGs.     OBJECTIVE IMPAIRMENTS Abnormal gait, decreased activity tolerance, decreased balance, decreased endurance, decreased mobility, difficulty walking, decreased ROM, decreased strength, increased edema, impaired flexibility, postural dysfunction, and pain.    ACTIVITY LIMITATIONS carrying, lifting, bending, standing, squatting, sleeping, stairs, and transfers   PARTICIPATION LIMITATIONS: cleaning, laundry, shopping, community activity, and church   PERSONAL FACTORS 3+ comorbidities: h/o Rt TKA, anxiety, depression  are also affecting patient's functional outcome.    REHAB POTENTIAL: fair to good   CLINICAL DECISION MAKING: Stable/uncomplicated   EVALUATION COMPLEXITY: Low     GOALS: Goals reviewed with patient? Yes   SHORT TERM GOALS: Target date: 06/29/2022   Patient will demonstrate independent use of initial home exercise program to maintain progress from in clinic treatments. A. Goal status: MET  06/22/2022   LONG TERM GOALS: Target date: 08/06/22 Patient will demonstrate/report pain at worst less than or equal to 2/10 to facilitate minimal limitation in daily activity secondary to pain symptoms. Goal status: On-going 07/01/22   Patient will demonstrate independent use of home exercise program to facilitate  ability to maintain/progress functional gains from skilled physical therapy services. Goal status: On-going 07/01/22   Patient will demonstrate FOTO outcome > or = 67 % to indicate reduced disability due to condition. Goal status: On-going 06/23/22   Pt will improve her 5 time sit to stand to </= 30 seconds with UE support for improved functional mobility.                   A. Goal Status: MET 06/30/22   5. Pt will improve her BERG balance assessment to >/= 35/56.                   A. Goal Status: On-going 06/23/22         PLAN: PT FREQUENCY: 1-2x/week   PT DURATION: 6 weeks   PLANNED INTERVENTIONS: Therapeutic exercises, Therapeutic activity, Neuromuscular re-education, Balance training, Gait training, Patient/Family education, Self Care, Joint mobilization, Stair training, Dry Needling, Electrical stimulation, Spinal mobilization, Cryotherapy, Moist heat, Traction, Ultrasound, Ionotophoresis 56m/ml Dexamethasone, and Manual therapy.   PLAN FOR NEXT SESSION:  Correcting her lateral shift, strengthening her low back and postural muscles along with strengthening B quadriceps  RFarley Ly PT, MPT 07/01/22 11:45 AM   07/01/22  11:45 AM

## 2022-07-06 ENCOUNTER — Encounter: Payer: Medicare PPO | Admitting: Physical Therapy

## 2022-07-07 ENCOUNTER — Encounter: Payer: Self-pay | Admitting: Physical Therapy

## 2022-07-07 ENCOUNTER — Ambulatory Visit: Payer: Medicare PPO | Admitting: Physical Therapy

## 2022-07-07 DIAGNOSIS — M25661 Stiffness of right knee, not elsewhere classified: Secondary | ICD-10-CM | POA: Diagnosis not present

## 2022-07-07 DIAGNOSIS — M6281 Muscle weakness (generalized): Secondary | ICD-10-CM | POA: Diagnosis not present

## 2022-07-07 DIAGNOSIS — R600 Localized edema: Secondary | ICD-10-CM

## 2022-07-07 DIAGNOSIS — M25561 Pain in right knee: Secondary | ICD-10-CM | POA: Diagnosis not present

## 2022-07-07 DIAGNOSIS — M5459 Other low back pain: Secondary | ICD-10-CM | POA: Diagnosis not present

## 2022-07-07 DIAGNOSIS — G8929 Other chronic pain: Secondary | ICD-10-CM | POA: Diagnosis not present

## 2022-07-07 DIAGNOSIS — R262 Difficulty in walking, not elsewhere classified: Secondary | ICD-10-CM

## 2022-07-07 NOTE — Therapy (Signed)
OUTPATIENT PHYSICAL THERAPY TREATMENT NOTE    Patient Name: [name]: [id number] DOB:August 15, 1946, 76 y.o., female Today's Date: 07/07/2022  PCP: [name], MD REFERRING PROVIDER: Meredith Pel, MD  END OF SESSION:   PT End of Session - 07/07/22 0949     Visit Number 8    Number of Visits 16    Date for PT Re-Evaluation 09/03/22    Authorization Time Period sent recert and [employer] cert on 3/53/61 for 12 additional visits.    Authorization - Visit Number 8    Authorization - Number of Visits 16    Progress Note Due on Visit 10    PT Start Time [phone]    PT Stop Time 1014    PT Time Calculation (min) 38 min    Activity Tolerance Patient limited by fatigue;Patient tolerated treatment well    Behavior During Therapy [facility] for tasks assessed/performed              Past Medical History:  Diagnosis Date   anal ca dx'd 11/2011   squamous cell carcinoma   Anxiety    Arthritis 2022   Depression    Full dentures    Hemorrhoids    external   History of radiation therapy 11/29/11 to 01/05/12   anal canal 5040 cGy 28 sessions, regional lymph nodes 4200 cGy 28 sessions   Hyperlipidemia    Lumbar radiculopathy 10/10/2019   Rectal bleeding    Staphylococcus aureus bacteremia with sepsis ([place])    UTI (urinary tract infection) 06/26/2021   Vitamin D deficiency    Wears glasses    Past Surgical History:  Procedure Laterality Date   ABDOMINAL HYSTERECTOMY  1978   age 66   BUBBLE STUDY  12/04/2020   Procedure: BUBBLE STUDY;  Surgeon: [name], MD;  Location: [place];  Service: Cardiology;;   EXAMINATION UNDER ANESTHESIA  06/21/2012   Procedure: EXAM UNDER ANESTHESIA;  Surgeon: [name], MD;  Location: [place];  Service: General;  Laterality: N/A;  rectal exam under anesthesia, anal dilation, rectal biopsy   [place]   Patient had to guess on the date.   RECTAL BIOPSY  06/21/2012   Procedure: BIOPSY RECTAL;  Surgeon:  [name], MD;  Location: [place];  Service: General;  Laterality: N/A;   TEE WITHOUT CARDIOVERSION N/A 12/04/2020   Procedure: TRANSESOPHAGEAL ECHOCARDIOGRAM (TEE);  Surgeon: [name], MD;  Location: [place];  Service: Cardiology;  Laterality: N/A;   TOTAL KNEE ARTHROPLASTY Right 06/09/2021   Procedure: RIGHT TOTAL KNEE ARTHROPLASTY;  Surgeon: Meredith Pel, MD;  Location: [place];  Service: Orthopedics;  Laterality: Right;   TUBAL LIGATION     b/l  age 56   Patient Active Problem List   Diagnosis Date Noted   E coli bacteremia    Acute lower UTI    Sepsis due to undetermined organism ([place]) 09/19/2021   Hypokalemia 09/19/2021   Leukopenia 09/19/2021   Prolonged QT interval 09/19/2021   Arthritis of right knee    UTI (urinary tract infection) 06/26/2021   Severe sepsis ([place]) 54/00/8676   Acute metabolic encephalopathy 19/50/9326   Hypothyroid 06/25/2021   S/P total knee arthroplasty, right 06/09/2021   Fever    Staphylococcus aureus bacteremia with sepsis ([place])    SIRS (systemic inflammatory response syndrome) ([place]) 11/30/2020   Lumbar radiculopathy 10/10/2019   Bilateral stenosis of lateral recess of lumbar spine 10/10/2019   Hemorrhoids, external  without complications 54/62/7035   Depression    Anxiety    Rectal bleeding    History of radiation therapy    Malignant neoplasm of anal canal ([place]) 11/15/2011   Hemorrhoids, internal, with bleeding 09/07/2011   Acquired anal stenosis 09/07/2011   Hyperlipidemia 09/07/2011   Family history of breast cancer in sister 09/07/2011    REFERRING DIAG: M54.16 (ICD-10-CM) - Lumbar radiculopathy  THERAPY DIAG:  Difficulty walking  Muscle weakness (generalized)  Other low back pain  Chronic pain of right knee  Stiffness of right knee, not elsewhere classified  Localized edema  Rationale for Evaluation and Treatment Rehabilitation  PERTINENT HISTORY: H/o Rt TKA, Anxiety, depression,  hyperlipidemia, vit D deficiency, wears glasses  PRECAUTIONS: Back  SUBJECTIVE: pt arriving reporting 3-4/10 pain in her low back. Pt reporting concerns about paying for her therapy visits and expressed need to decrease her frequency.   PAIN:  NPRS scale: back:  3-4/10 in low back  Pain location: Low back Pain description: Throbbing, ache, sore, can get sharp Aggravating factors: Prolonged standing and walking, when first getting up Relieving factors: Change of position, pain meds, back brace   OBJECTIVE: (objective measures completed at initial evaluation unless otherwise dated)   DIAGNOSTIC FINDINGS:  06/08/2022 review of chart:  IMPRESSION: 1. Status post right total knee arthroplasty without evidence of hardware loosening, acute fracture or dislocation. 2. Suspected rupture of the patellar tendon with resulting patella alta and pseudoarticulation between the patella and the distal femur. The patellar tendon is not well visualized on this examination due to artifact from the total knee arthroplasty. In this clinical context, ultrasound may be helpful to better assess the patellar tendon if clinically warranted.     IMPRESSION: 1. Progressive disc and facet degeneration at L3-4 with a new right foraminal disc protrusion. Severe right neural foraminal stenosis and moderate spinal stenosis. 2. Progressive, moderate spinal stenosis and moderate bilateral neural foraminal stenosis at L5-S1.   PATIENT SURVEYS:  06/08/22: FOTO 63% (predicted 67%)   SCREENING FOR RED FLAGS: 06/08/22 Bowel or bladder incontinence: No Spinal tumors: No Cauda equina syndrome: No Compression fracture: No Abdominal aneurysm: No   COGNITION:           06/08/22 Overall cognitive status: Within functional limits for tasks assessed                             SENSATION: 06/08/22: [facility]   MUSCLE LENGTH: 06/08/22 Hamstrings: Right 56 deg; Left 64 deg     POSTURE:  06/08/2022  rounded shoulders, forward  head, decreased lumbar lordosis, increased thoracic kyphosis, flexed trunk , and weight shift left   PALPATION: 06/08/2022  TTP: SI joint on the Rt, Rt knee joint line tenderness   LUMBAR ROM:      AROM  06/08/2022 06/30/22  Flexion 62 60  Extension 8 10  Right lateral flexion 10 12  Left lateral flexion 18 20  Right rotation Limited 75% Limited 75%  Left rotation Limited 75% Limited 75%   (Blank rows = not tested)   LOWER EXTREMITY ROM:      A= active P= passive  Right 06/08/2022 Left 06/08/2022  Hip flexion A: 90 A: 96  Hip extension A: 10 A: 15  Hip abduction A: 30 A: 35  Hip adduction      Hip internal rotation      Hip external rotation      Knee flexion A: 112 P: 115  A: 120 P: 122  Knee extension A: -6 P: -4 A: 0  Ankle dorsiflexion      Ankle plantarflexion      Ankle inversion      Ankle eversion       (Blank rows = not tested)   LOWER EXTREMITY MMT:     MMT Right 06/08/2022 Left 06/08/2022  Hip flexion 3/5 4/5  Hip extension 3/5 4/5  Hip abduction 3/5 4-/5  Hip adduction 3/5 4-/5  Hip internal rotation      Hip external rotation      Knee flexion      Knee extension      Ankle dorsiflexion      Ankle plantarflexion      Ankle inversion      Ankle eversion       (Blank rows = not tested)   LUMBAR SPECIAL TESTS:  06/08/2022 Straight leg raise test: Negative on Rt Slump test: negative bilateral   FUNCTIONAL TESTS:  06/08/2022 : 5 times sit to stand: 72 seconds with UE support   06/30/22: 5 time sit to stand:  22 sec with UE support    GAIT: 06/08/2022 Distance walked: 30 feet  Assistive device utilized: Single point cane Level of assistance: Modified independence Comments: lateral lean to left, decreased step length and foot clearance with forward flexed posture       TODAY'S TREATMENT  07/07/22 Therex: Seated LAQ x 15 4# weight  Standing hip flexion x 20 c UE support Standing hip extension x 20 each LE c UE support Calf raises x 15  c UE  support Standing hip abd x 10 each LE  c UE support Standing Rt trunk rotation with UE support x 10 each side holding 5 seconds Step up on 4 inch step with st cane in left UE and Rt UE support x 10 Performance Test:  BERG: 41/56    07/01/2022: Standing lumbar extension (shift correct 1st) 10X 3 seconds Shoulder blade pinches (SBP) 10X 5 seconds Shift correction (push R hip to L and bring shoulders back to the R) 2 sets of 10 for 3 seconds SciFit Bike push with legs and pull with UE 5 minutes Level 4.5 for 5 minutes Heel Toe Raises with pelvic stabilization 10X 3 seconds  Functional Activities for sit to stand and stairs: Leg Press 50# slow eccentrics 10X (B LE) Sit to stand with SBP and lumbar extension when standing 2 sets of 5 (for postural correction with standing, gait, sit to stand and stairs)   06/30/2022: Therex: Scifit recumbent bike level 1 x 5 minutes Standing hip flexion x 20 c UE support Standing on yoga block and performing heel taps with opposite foot x 12  Calf raises x 15  c UE support Standing hip abd x 10 each LE  c UE support Supine lumbar trunk rot bilateral x 3 holding 20 sec  We attempted to place pt's st cane in her Rt UE to decrease her lateral lean to the left. Pt wasn't able to respond well to new sequencing and unable to shift weight over her device.    06/22/2022: Therex: Nustep: level 3 x 7 minutes Lateral shift correction against wall in standing x 5 c verbal and tactile cues Lateral shift correction in parallel bars using the mirror for feedback x 10 NDT tactile cues Standing hip flexion in parallel bars x 20 c UE support Standing against wall with ball placed as lumbar support while performing bil shoulder flexion x  10 c close supervision Calf raises x 15 in parallel bars c UE support Standing hip abd x 10 each LE in parallel bars c UE support Supine lumbar trunk rotation 20 seconds x 3 bilateral SKTC 20 sec x 2 bilateral      PATIENT  EDUCATION:  06/08/2022 Education details: PT POC, HEP Person educated: Patient Education method: Consulting civil engineer, Demonstration, Corporate treasurer cues, Verbal cues, and Handouts Education comprehension: verbalized understanding, returned demonstration, verbal cues required, tactile cues required, and needs further education     HOME EXERCISE PROGRAM: Access Code: [email or web address] URL: [email or web address] Date: 07/01/2022 Prepared by: Vista Mink  Exercises - Standing Scapular Retraction  - 5 x daily - 7 x weekly - 1 sets - 5 reps - 5 second hold - Sit to Stand with Armchair  - 5 x daily - 7 x weekly - 1 sets - 5 reps - Standing Lumbar Extension at Wall - Forearms  - 5 x daily - 7 x weekly - 1 sets - 5 reps - 3 seconds hold    ASSESSMENT:   CLINICAL IMPRESSION: Pt arriving to therapy reporting due to financial restrictions she will only be able to come 2 more visits dropping frequency to 1x/ week. Pt has met her LTG for the BERG balance with a score today of 41/56. Pt still with lateral lean to left during standing, sitting, and walking. Pt is unable to correct independently. Continue skilled PT for 2 more visit to progress toward goals set.      OBJECTIVE IMPAIRMENTS Abnormal gait, decreased activity tolerance, decreased balance, decreased endurance, decreased mobility, difficulty walking, decreased ROM, decreased strength, increased edema, impaired flexibility, postural dysfunction, and pain.    ACTIVITY LIMITATIONS carrying, lifting, bending, standing, squatting, sleeping, stairs, and transfers   PARTICIPATION LIMITATIONS: cleaning, laundry, shopping, community activity, and church   PERSONAL FACTORS 3+ comorbidities: h/o Rt TKA, anxiety, depression  are also affecting patient's functional outcome.    REHAB POTENTIAL: fair to good   CLINICAL DECISION MAKING: Stable/uncomplicated   EVALUATION COMPLEXITY: Low     GOALS: Goals reviewed with patient? Yes   SHORT TERM GOALS:  Target date: 06/29/2022   Patient will demonstrate independent use of initial home exercise program to maintain progress from in clinic treatments. A. Goal status: MET  06/22/2022   LONG TERM GOALS: Target date: 08/06/22 Patient will demonstrate/report pain at worst less than or equal to 2/10 to facilitate minimal limitation in daily activity secondary to pain symptoms. Goal status: On-going 07/07/22   Patient will demonstrate independent use of home exercise program to facilitate ability to maintain/progress functional gains from skilled physical therapy services. Goal status: MET 07/07/22   Patient will demonstrate FOTO outcome > or = 67 % to indicate reduced disability due to condition. Goal status: On-going 06/23/22   Pt will improve her 5 time sit to stand to </= 30 seconds with UE support for improved functional mobility.                   A. Goal Status: MET 06/30/22   5. Pt will improve her BERG balance assessment to >/= 35/56.                   A. Goal Status: (41/56) MET 07/07/2022         PLAN: PT FREQUENCY: 1-2x/week   PT DURATION: 6 weeks   PLANNED INTERVENTIONS: Therapeutic exercises, Therapeutic activity, Neuromuscular re-education, Balance training, Gait training, Patient/Family education, Self Care, Joint mobilization, Stair training, Dry  Needling, Electrical stimulation, Spinal mobilization, Cryotherapy, Moist heat, Traction, Ultrasound, Ionotophoresis 41m/ml Dexamethasone, and Manual therapy.   PLAN FOR NEXT SESSION: 2 more visits for posture correction, LE and core strengthening    JKearney Hard PT, MPT 07/07/22 10:10 AM   07/07/22 10:10 AM   07/07/22  10:10 AM

## 2022-07-08 ENCOUNTER — Encounter: Payer: Medicare PPO | Admitting: Rehabilitative and Restorative Service Providers"

## 2022-07-15 ENCOUNTER — Encounter: Payer: Self-pay | Admitting: Rehabilitative and Restorative Service Providers"

## 2022-07-15 ENCOUNTER — Ambulatory Visit: Payer: Medicare PPO | Admitting: Rehabilitative and Restorative Service Providers"

## 2022-07-15 DIAGNOSIS — R600 Localized edema: Secondary | ICD-10-CM | POA: Diagnosis not present

## 2022-07-15 DIAGNOSIS — G8929 Other chronic pain: Secondary | ICD-10-CM

## 2022-07-15 DIAGNOSIS — M6281 Muscle weakness (generalized): Secondary | ICD-10-CM

## 2022-07-15 DIAGNOSIS — M25561 Pain in right knee: Secondary | ICD-10-CM

## 2022-07-15 DIAGNOSIS — M5459 Other low back pain: Secondary | ICD-10-CM | POA: Diagnosis not present

## 2022-07-15 DIAGNOSIS — M25661 Stiffness of right knee, not elsewhere classified: Secondary | ICD-10-CM | POA: Diagnosis not present

## 2022-07-15 DIAGNOSIS — R262 Difficulty in walking, not elsewhere classified: Secondary | ICD-10-CM

## 2022-07-15 NOTE — Therapy (Addendum)
OUTPATIENT PHYSICAL THERAPY TREATMENT NOTE /DISCHARGE    Patient Name: [name]: [id number] DOB:1946/01/15, 76 y.o., female Today's Date: 07/15/2022  PCP: [name], MD REFERRING PROVIDER: Meredith Pel, MD  END OF SESSION:   PT End of Session - 07/15/22 0858     Visit Number 9    Number of Visits 16    Date for PT Re-Evaluation 09/03/22    Authorization Time Period sent recert and [employer] cert on 9/62/83 for 12 additional visits through 07/23/2022    Authorization - Visit Number 9    Authorization - Number of Visits 16    Progress Note Due on Visit 10    PT Start Time 0846    PT Stop Time 0925    PT Time Calculation (min) 39 min    Activity Tolerance Patient limited by fatigue;Patient tolerated treatment well    Behavior During Therapy [facility] for tasks assessed/performed               Past Medical History:  Diagnosis Date   anal ca dx'd 11/2011   squamous cell carcinoma   Anxiety    Arthritis 2022   Depression    Full dentures    Hemorrhoids    external   History of radiation therapy 11/29/11 to 01/05/12   anal canal 5040 cGy 28 sessions, regional lymph nodes 4200 cGy 28 sessions   Hyperlipidemia    Lumbar radiculopathy 10/10/2019   Rectal bleeding    Staphylococcus aureus bacteremia with sepsis ([place])    UTI (urinary tract infection) 06/26/2021   Vitamin D deficiency    Wears glasses    Past Surgical History:  Procedure Laterality Date   ABDOMINAL HYSTERECTOMY  1978   age 75   BUBBLE STUDY  12/04/2020   Procedure: BUBBLE STUDY;  Surgeon: [name], MD;  Location: [place];  Service: Cardiology;;   EXAMINATION UNDER ANESTHESIA  06/21/2012   Procedure: EXAM UNDER ANESTHESIA;  Surgeon: [name], MD;  Location: [place];  Service: General;  Laterality: N/A;  rectal exam under anesthesia, anal dilation, rectal biopsy   [place]   Patient had to guess on the date.   RECTAL BIOPSY  06/21/2012    Procedure: BIOPSY RECTAL;  Surgeon: [name], MD;  Location: [place];  Service: General;  Laterality: N/A;   TEE WITHOUT CARDIOVERSION N/A 12/04/2020   Procedure: TRANSESOPHAGEAL ECHOCARDIOGRAM (TEE);  Surgeon: [name], MD;  Location: [place];  Service: Cardiology;  Laterality: N/A;   TOTAL KNEE ARTHROPLASTY Right 06/09/2021   Procedure: RIGHT TOTAL KNEE ARTHROPLASTY;  Surgeon: Meredith Pel, MD;  Location: [place];  Service: Orthopedics;  Laterality: Right;   TUBAL LIGATION     b/l  age 72   Patient Active Problem List   Diagnosis Date Noted   E coli bacteremia    Acute lower UTI    Sepsis due to undetermined organism ([place]) 09/19/2021   Hypokalemia 09/19/2021   Leukopenia 09/19/2021   Prolonged QT interval 09/19/2021   Arthritis of right knee    UTI (urinary tract infection) 06/26/2021   Severe sepsis ([place]) 66/29/4765   Acute metabolic encephalopathy 46/50/3546   Hypothyroid 06/25/2021   S/P total knee arthroplasty, right 06/09/2021   Fever    Staphylococcus aureus bacteremia with sepsis ([place])    SIRS (systemic inflammatory response syndrome) ([place]) 11/30/2020   Lumbar radiculopathy 10/10/2019   Bilateral stenosis of lateral recess of lumbar spine 10/10/2019  Hemorrhoids, external without complications 93/71/6967   Depression    Anxiety    Rectal bleeding    History of radiation therapy    Malignant neoplasm of anal canal ([place]) 11/15/2011   Hemorrhoids, internal, with bleeding 09/07/2011   Acquired anal stenosis 09/07/2011   Hyperlipidemia 09/07/2011   Family history of breast cancer in sister 09/07/2011    REFERRING DIAG: M54.16 (ICD-10-CM) - Lumbar radiculopathy  THERAPY DIAG:  Chronic pain of right knee  Stiffness of right knee, not elsewhere classified  Localized edema  Muscle weakness (generalized)  Difficulty walking  Other low back pain  Rationale for Evaluation and Treatment Rehabilitation  PERTINENT HISTORY:  H/o Rt TKA, Anxiety, depression, hyperlipidemia, vit D deficiency, wears glasses  PRECAUTIONS: Back  SUBJECTIVE: Pt indicated complaints up to 7/10 with standing/walking activity (pain in back and knee)   PAIN:  NPRS scale: up to 7/10 Pain location: Low back, Rt knee  Pain description: Throbbing, ache, sore, can get sharp Aggravating factors: Prolonged standing and walking Relieving factors: Change of position, pain meds, back brace   OBJECTIVE: (objective measures completed at initial evaluation unless otherwise dated)   DIAGNOSTIC FINDINGS:  06/08/2022 review of chart:  IMPRESSION: 1. Status post right total knee arthroplasty without evidence of hardware loosening, acute fracture or dislocation. 2. Suspected rupture of the patellar tendon with resulting patella alta and pseudoarticulation between the patella and the distal femur. The patellar tendon is not well visualized on this examination due to artifact from the total knee arthroplasty. In this clinical context, ultrasound may be helpful to better assess the patellar tendon if clinically warranted.     IMPRESSION: 1. Progressive disc and facet degeneration at L3-4 with a new right foraminal disc protrusion. Severe right neural foraminal stenosis and moderate spinal stenosis. 2. Progressive, moderate spinal stenosis and moderate bilateral neural foraminal stenosis at L5-S1.   PATIENT SURVEYS:  06/08/22: FOTO 63% (predicted 67%)   SCREENING FOR RED FLAGS: 06/08/22 Bowel or bladder incontinence: No Spinal tumors: No Cauda equina syndrome: No Compression fracture: No Abdominal aneurysm: No   COGNITION:           06/08/22 Overall cognitive status: Within functional limits for tasks assessed                             SENSATION: 06/08/22: [facility]   MUSCLE LENGTH: 06/08/22 Hamstrings: Right 56 deg; Left 64 deg     POSTURE:  06/08/2022  rounded shoulders, forward head, decreased lumbar lordosis, increased thoracic kyphosis,  flexed trunk , and weight shift left   PALPATION: 06/08/2022  TTP: SI joint on the Rt, Rt knee joint line tenderness   LUMBAR ROM:      AROM  06/08/2022 06/30/22  Flexion 62 60  Extension 8 10  Right lateral flexion 10 12  Left lateral flexion 18 20  Right rotation Limited 75% Limited 75%  Left rotation Limited 75% Limited 75%   (Blank rows = not tested)   LOWER EXTREMITY ROM:      A= active P= passive  Right 06/08/2022 Left 06/08/2022  Hip flexion A: 90 A: 96  Hip extension A: 10 A: 15  Hip abduction A: 30 A: 35  Hip adduction      Hip internal rotation      Hip external rotation      Knee flexion A: 112 P: 115 A: 120 P: 122  Knee extension A: -6 P: -4 A: 0  Ankle dorsiflexion      Ankle plantarflexion      Ankle inversion      Ankle eversion       (Blank rows = not tested)   LOWER EXTREMITY MMT:     MMT Right 06/08/2022 Left 06/08/2022 Right 07/15/2022 Left 07/15/2022  Hip flexion 3/5 4/5 5/5 5/5  Hip extension 3/5 4/5    Hip abduction 3/5 4-/5    Hip adduction 3/5 4-/5    Hip internal rotation        Hip external rotation        Knee flexion        Knee extension     5/5 5/5  Ankle dorsiflexion        Ankle plantarflexion        Ankle inversion        Ankle eversion         (Blank rows = not tested)   LUMBAR SPECIAL TESTS:  06/08/2022 Straight leg raise test: Negative on Rt Slump test: negative bilateral   FUNCTIONAL TESTS:  06/08/2022 : 5 times sit to stand: 72 seconds with UE support   06/30/22: 5 time sit to stand:  22 sec with UE support    GAIT: 06/08/2022 Distance walked: 30 feet  Assistive device utilized: Single point cane Level of assistance: Modified independence Comments: lateral lean to left, decreased step length and foot clearance with forward flexed posture       TODAY'S TREATMENT  07/15/2022 Therex: Nustep UE/LE Lvl 5 10 mins for endurance, general aerobic exercise Seated LAQ 2 x 10 4 lbs, bilateral c cues for upright posture Seated  green band rows 2 x 10  Seated multifidi isometric UE lift into table 5 sec hold x 10  Seated scapular retraction hold 5 sec x 10   Sit to stand to sit 22 inch table height no UE assist x 10 Standing counter support heel raises x 10 Standing counter support hip abduction x 10 bilateral  07/07/22 Therex: Seated LAQ x 15 4# weight  Standing hip flexion x 20 c UE support Standing hip extension x 20 each LE c UE support Calf raises x 15  c UE support Standing hip abd x 10 each LE  c UE support Standing Rt trunk rotation with UE support x 10 each side holding 5 seconds Step up on 4 inch step with st cane in left UE and Rt UE support x 10 Performance Test:  BERG: 41/56    07/01/2022: Standing lumbar extension (shift correct 1st) 10X 3 seconds Shoulder blade pinches (SBP) 10X 5 seconds Shift correction (push R hip to L and bring shoulders back to the R) 2 sets of 10 for 3 seconds SciFit Bike push with legs and pull with UE 5 minutes Level 4.5 for 5 minutes Heel Toe Raises with pelvic stabilization 10X 3 seconds  Functional Activities for sit to stand and stairs: Leg Press 50# slow eccentrics 10X (B LE) Sit to stand with SBP and lumbar extension when standing 2 sets of 5 (for postural correction with standing, gait, sit to stand and stairs)   06/30/2022: Therex: Scifit recumbent bike level 1 x 5 minutes Standing hip flexion x 20 c UE support Standing on yoga block and performing heel taps with opposite foot x 12  Calf raises x 15  c UE support Standing hip abd x 10 each LE  c UE support Supine lumbar trunk rot bilateral x 3 holding 20 sec  We attempted  to place pt's st cane in her Rt UE to decrease her lateral lean to the left. Pt wasn't able to respond well to new sequencing and unable to shift weight over her device.     PATIENT EDUCATION:  06/08/2022 Education details: PT POC, HEP Person educated: Patient Education method: Consulting civil engineer, Demonstration, Corporate treasurer cues, Verbal cues,  and Handouts Education comprehension: verbalized understanding, returned demonstration, verbal cues required, tactile cues required, and needs further education     HOME EXERCISE PROGRAM: Access Code: [email or web address] URL: [email or web address] Date: 07/01/2022 Prepared by: Vista Mink  Exercises - Standing Scapular Retraction  - 5 x daily - 7 x weekly - 1 sets - 5 reps - 5 second hold - Sit to Stand with Armchair  - 5 x daily - 7 x weekly - 1 sets - 5 reps - Standing Lumbar Extension at Wall - Forearms  - 5 x daily - 7 x weekly - 1 sets - 5 reps - 3 seconds hold    ASSESSMENT:   CLINICAL IMPRESSION: Continued complaints of primarily WB activity pain in back and knee that impair her functional mobility.  Lateral head/trunk lean to Lt still present in various activities.  Reassessment of her hip flexion strength was improved compared to evaluation.  Returning next week for reassessment and POC decisions about future appointments/HEP use.      OBJECTIVE IMPAIRMENTS Abnormal gait, decreased activity tolerance, decreased balance, decreased endurance, decreased mobility, difficulty walking, decreased ROM, decreased strength, increased edema, impaired flexibility, postural dysfunction, and pain.    ACTIVITY LIMITATIONS carrying, lifting, bending, standing, squatting, sleeping, stairs, and transfers   PARTICIPATION LIMITATIONS: cleaning, laundry, shopping, community activity, and church   PERSONAL FACTORS 3+ comorbidities: h/o Rt TKA, anxiety, depression  are also affecting patient's functional outcome.    REHAB POTENTIAL: fair to good   CLINICAL DECISION MAKING: Stable/uncomplicated   EVALUATION COMPLEXITY: Low     GOALS: Goals reviewed with patient? Yes   SHORT TERM GOALS: Target date: 06/29/2022   Patient will demonstrate independent use of initial home exercise program to maintain progress from in clinic treatments. A. Goal status: MET  06/22/2022   LONG TERM GOALS:  Target date: 08/06/22 Patient will demonstrate/report pain at worst less than or equal to 2/10 to facilitate minimal limitation in daily activity secondary to pain symptoms. Goal status: On-going 07/07/22   Patient will demonstrate independent use of home exercise program to facilitate ability to maintain/progress functional gains from skilled physical therapy services. Goal status: MET 07/07/22   Patient will demonstrate FOTO outcome > or = 67 % to indicate reduced disability due to condition. Goal status: On-going 06/23/22   Pt will improve her 5 time sit to stand to </= 30 seconds with UE support for improved functional mobility.                   A. Goal Status: MET 06/30/22   5. Pt will improve her BERG balance assessment to >/= 35/56.                   A. Goal Status: (41/56) MET 07/07/2022         PLAN: PT FREQUENCY: 1-2x/week   PT DURATION: 6 weeks   PLANNED INTERVENTIONS: Therapeutic exercises, Therapeutic activity, Neuromuscular re-education, Balance training, Gait training, Patient/Family education, Self Care, Joint mobilization, Stair training, Dry Needling, Electrical stimulation, Spinal mobilization, Cryotherapy, Moist heat, Traction, Ultrasound, Ionotophoresis 50m/ml Dexamethasone, and Manual therapy.   PLAN FOR NEXT SESSION: FOTO reassessment/progress note, plan  evaluation going forward.   Review and establish her HEP    Scot Jun, PT, DPT, OCS, ATC 07/15/22  9:23 AM  PHYSICAL THERAPY DISCHARGE SUMMARY  Visits from Start of Care:  9  Current functional level related to goals / functional outcomes: See note   Remaining deficits: See note   Education / Equipment: HEP  Patient goals were partially met. Patient is being discharged due to not returning since the last visit.  Scot Jun, PT, DPT, OCS, ATC 09/02/22  9:01 AM

## 2022-07-19 ENCOUNTER — Emergency Department (HOSPITAL_COMMUNITY)
Admission: EM | Admit: 2022-07-19 | Discharge: 2022-07-19 | Disposition: A | Discharge status: Home / Self Care | Payer: Medicare PPO | Attending: Emergency Medicine | Admitting: Emergency Medicine

## 2022-07-19 ENCOUNTER — Encounter (HOSPITAL_COMMUNITY): Payer: Self-pay

## 2022-07-19 ENCOUNTER — Emergency Department (HOSPITAL_COMMUNITY): Payer: Medicare PPO

## 2022-07-19 ENCOUNTER — Other Ambulatory Visit: Payer: Self-pay

## 2022-07-19 DIAGNOSIS — M25531 Pain in right wrist: Secondary | ICD-10-CM | POA: Diagnosis not present

## 2022-07-19 DIAGNOSIS — S59911A Unspecified injury of right forearm, initial encounter: Secondary | ICD-10-CM | POA: Diagnosis present

## 2022-07-19 DIAGNOSIS — Y92009 Unspecified place in unspecified non-institutional (private) residence as the place of occurrence of the external cause: Secondary | ICD-10-CM | POA: Insufficient documentation

## 2022-07-19 DIAGNOSIS — S52501A Unspecified fracture of the lower end of right radius, initial encounter for closed fracture: Secondary | ICD-10-CM | POA: Diagnosis not present

## 2022-07-19 DIAGNOSIS — W010XXA Fall on same level from slipping, tripping and stumbling without subsequent striking against object, initial encounter: Secondary | ICD-10-CM | POA: Insufficient documentation

## 2022-07-19 DIAGNOSIS — Z7982 Long term (current) use of aspirin: Secondary | ICD-10-CM | POA: Insufficient documentation

## 2022-07-19 MED ORDER — OXYCODONE HCL 5 MG PO TABS
5.0000 mg | ORAL_TABLET | ORAL | 0 refills | Status: DC | PRN
Start: 2022-07-19 — End: 2022-07-30

## 2022-07-19 MED ORDER — OXYCODONE-ACETAMINOPHEN 5-325 MG PO TABS
1.0000 | ORAL_TABLET | Freq: Once | ORAL | Status: AC
  Administered 2022-07-19: 1 via ORAL
  Filled 2022-07-19: qty 1

## 2022-07-19 NOTE — ED Triage Notes (Signed)
At 0100 pt got up to go to the bathroom and didn't use cane. Pt slipped and fell onto right wrist. Wrist is swollen and painful. Pulse intact. Pt denies hitting head or LOC.

## 2022-07-19 NOTE — ED Notes (Signed)
OT at bedside applying split and sling.

## 2022-07-19 NOTE — ED Notes (Signed)
Patient verbalizes understanding of d/c instructions. Opportunities for questions and answers were provided. Pt d/c from ED and wheeled to lobby with family.  

## 2022-07-19 NOTE — ED Provider Notes (Signed)
[facility] EMERGENCY DEPARTMENT Provider Note   CSN: [id number] Arrival date & time: 07/19/22  0234     History  Chief Complaint  Patient presents with   Arm Injury    [name] is a 76 y.o. female.  The history is provided by the patient.  Arm Injury She has history of hyperlipidemia and comes in following a fall at home.  She tripped and fell injuring her right wrist.  She denies other injury.  She denies any head or neck injury.  She is not on any anticoagulants.   Home Medications Prior to Admission medications   Medication Sig Start Date End Date Taking? Authorizing Provider  acetaminophen (TYLENOL) 650 MG CR tablet Take 650 mg by mouth every 8 (eight) hours as needed for pain.    [provider]  acetaminophen-codeine (TYLENOL #3) 300-30 MG tablet Take 1 tablet by mouth every 8 (eight) hours as needed for moderate pain. 05/17/22   Meredith Pel, MD  ARIPiprazole (ABILIFY) 10 MG tablet Take 10 mg by mouth in the morning. 11/23/18   [provider]  aspirin 81 MG chewable tablet Chew 1 tablet (81 mg total) by mouth 2 (two) times daily. Patient taking differently: Chew 81 mg by mouth in the morning. 06/13/21   Meredith Pel, MD  Calcium Carb-Cholecalciferol (CALCIUM 600+D3 PO) Take 1 tablet by mouth in the morning.    [provider]  celecoxib (CELEBREX) 100 MG capsule Take 1 capsule (100 mg total) by mouth daily as needed for mild pain. 07/20/21   Magnant, Gerrianne Scale, PA-C  cholecalciferol (VITAMIN D) 25 MCG (1000 UNIT) tablet Take 1,000 Units by mouth in the morning.    [provider]  DULoxetine (CYMBALTA) 60 MG capsule Take 60 mg by mouth 2 (two) times daily.    [provider]  gabapentin (NEURONTIN) 600 MG tablet Take 600 mg by mouth 2 (two) times daily. 07/14/16   [provider]  levothyroxine (SYNTHROID, LEVOTHROID) 25 MCG tablet Take 25 mcg by mouth daily before breakfast. 08/29/18    [provider]  Multiple Vitamin (MULTIVITAMIN WITH MINERALS) TABS tablet Take 1 tablet by mouth in the morning. Centrum Silver for Women    [provider]  pravastatin (PRAVACHOL) 40 MG tablet Take 40 mg by mouth daily.    [provider]  traZODone (DESYREL) 100 MG tablet Take 100 mg by mouth at bedtime.    [provider]  vitamin B-12 (CYANOCOBALAMIN) 500 MCG tablet Take 500 mcg by mouth in the morning.    [provider]      Allergies    Patient has no known allergies.    Review of Systems   Review of Systems  All other systems reviewed and are negative.   Physical Exam Updated Vital Signs BP 132/76 (BP Location: Left Arm)   Pulse 70   Temp 98.1 F (36.7 C) (Oral)   Resp 16   Ht '5\' 2"'$  (1.575 m)   Wt 75.7 kg   SpO2 96%   BMI 30.52 kg/m  Physical Exam Vitals and nursing note reviewed.   76 year old female, resting comfortably and in no acute distress. Vital signs are normal. Oxygen saturation is 96%, which is normal. Head is normocephalic and atraumatic. PERRLA, EOMI. Oropharynx is clear. Neck is nontender and supple without adenopathy or JVD. Back is nontender and there is no CVA tenderness. Lungs are clear without rales, wheezes, or rhonchi. Chest is nontender.  Heart has regular rate and rhythm without murmur. Abdomen is soft, flat, nontender. Extremities: There is soft tissue swelling of the right wrist with tenderness diffusely and pain on any passive range of motion.  Distal neurovascular exam is intact with prompt capillary refill, normal sensation. Skin is warm and dry without rash. Neurologic: Mental status is normal, cranial nerves are intact, moves all extremities equally.  ED Results / Procedures / Treatments    Radiology DG Wrist Complete Right  Result Date: 07/19/2022 CLINICAL DATA:  Recent trip and fall with wrist pain and deformity, initial encounter EXAM: RIGHT WRIST - COMPLETE 3+ VIEW COMPARISON:  None  Available. FINDINGS: Significantly comminuted fracture of the distal right radius is noted with impaction and intra-articular involvement. No significant angulation is seen. Ulnar styloid fracture is noted as well. No other fractures are seen. IMPRESSION: Distal radial and ulnar fractures. Electronically Signed   By: Inez Catalina M.D.   On: 07/19/2022 03:10    Procedures .Splint Application  Date/Time: 07/19/2022 4:09 AM  Performed by: Delora Fuel, MD Authorized by: Delora Fuel, MD   Consent:    Consent obtained:  Verbal   Consent given by:  Patient   Risks, benefits, and alternatives were discussed: yes     Risks discussed:  Numbness and discoloration   Alternatives discussed:  No treatment Universal protocol:    Procedure explained and questions answered to patient or proxy's satisfaction: yes     Relevant documents present and verified: yes     Test results available: yes     Imaging studies available: yes     Required blood products, implants, devices, and special equipment available: yes     Site/side marked: yes     Immediately prior to procedure a time out was called: yes     Patient identity confirmed:  Arm band and verbally with patient Pre-procedure details:    Distal neurologic exam:  Normal   Distal perfusion: brisk capillary refill   Procedure details:    Location:  Wrist   Wrist location:  R wrist   Strapping: no     Splint type:  Sugar tong   Supplies:  Elastic bandage, fiberglass and sling Post-procedure details:    Distal neurologic exam:  Unchanged   Distal perfusion: unchanged     Procedure completion:  Tolerated well, no immediate complications   Post-procedure imaging: not applicable       Medications Ordered in ED Medications  oxyCODONE-acetaminophen (PERCOCET/ROXICET) 5-325 MG per tablet 1 tablet (has no administration in time range)    ED Course/ Medical Decision Making/ A&P                           Medical Decision Making Amount and/or  Complexity of Data Reviewed Radiology: ordered.  Risk Prescription drug management.   Fall with right wrist injury concerning for fracture.  X-rays do show a markedly comminuted distal right radius fracture with intra-articular involvement, also ulnar styloid fracture.  I have independently viewed the images, and agree with the radiologist's interpretation.  I have ordered a sugar-tong splint to be applied and she is referred to hand surgery for follow-up.  She is currently taking celecoxib and is advised to continue taking that, supplement with acetaminophen.  She is also given prescription for oxycodone for additional pain relief, if needed.  Final Clinical Impression(s) / ED Diagnoses Final diagnoses:  Closed fracture of distal end of right radius, initial encounter  Rx / DC Orders ED Discharge Orders          Ordered    oxyCODONE (ROXICODONE) 5 MG immediate release tablet  Every 4 hours PRN        07/19/22 5427              Delora Fuel, MD [birth year] [phone]

## 2022-07-19 NOTE — Discharge Instructions (Signed)
Apply ice for 30 minutes at a time, 4 times a day.  Keep your hand elevated is much as possible.  Continue taking celecoxib.  If you need additional pain relief, you may add acetaminophen.  If you still need additional pain relief, you may take the oxycodone.

## 2022-07-19 NOTE — ED Notes (Signed)
OT called to apply splint and sling.

## 2022-07-19 NOTE — Progress Notes (Signed)
Orthopedic Tech Progress Note Patient Details:  [name] Jul 10, 1946 [id number]  The splint was applied, the other ortho tech held and molded the splint.  Ortho Devices Type of Ortho Device: Sugartong splint, Sling immobilizer Ortho Device/Splint Location: RUE Ortho Device/Splint Interventions: Ordered, Application, Adjustment   Post Interventions Patient Tolerated: Well Instructions Provided: Care of device, Adjustment of device  Arville Go 07/19/2022, 7:03 AM

## 2022-07-21 ENCOUNTER — Encounter: Payer: Medicare PPO | Admitting: Physical Therapy

## 2022-07-22 DIAGNOSIS — S52531A Colles' fracture of right radius, initial encounter for closed fracture: Secondary | ICD-10-CM | POA: Diagnosis not present

## 2022-07-23 ENCOUNTER — Telehealth: Payer: Self-pay

## 2022-07-23 NOTE — Telephone Encounter (Signed)
     Patient  visit on 07/19/2022  at [facility]    Have you been able to follow up with your primary care physician? YES  The patient was or was not able to obtain any needed medicine or equipment.YES  Are there diet recommendations that you are having difficulty following?NA  Patient expresses understanding of discharge instructions and education provided has no other needs at this time. [place], [facility], Care Management  [phone] [address], [place], [place] [address] Phone: [phone] Email: [email or web address]

## 2022-07-28 ENCOUNTER — Other Ambulatory Visit: Payer: Self-pay | Admitting: Podiatry

## 2022-07-28 DIAGNOSIS — M2041 Other hammer toe(s) (acquired), right foot: Secondary | ICD-10-CM

## 2022-07-28 DIAGNOSIS — S52571A Other intraarticular fracture of lower end of right radius, initial encounter for closed fracture: Secondary | ICD-10-CM | POA: Diagnosis not present

## 2022-07-29 ENCOUNTER — Other Ambulatory Visit: Payer: Self-pay

## 2022-07-29 ENCOUNTER — Encounter (HOSPITAL_COMMUNITY): Payer: Self-pay

## 2022-07-29 ENCOUNTER — Emergency Department (HOSPITAL_BASED_OUTPATIENT_CLINIC_OR_DEPARTMENT_OTHER): Payer: Medicare PPO

## 2022-07-29 ENCOUNTER — Emergency Department (HOSPITAL_COMMUNITY)
Admission: EM | Admit: 2022-07-29 | Discharge: 2022-07-30 | Disposition: A | Discharge status: Skilled Nursing Facility | Payer: Medicare PPO | Attending: Emergency Medicine | Admitting: Emergency Medicine

## 2022-07-29 DIAGNOSIS — R52 Pain, unspecified: Secondary | ICD-10-CM | POA: Diagnosis not present

## 2022-07-29 DIAGNOSIS — S6991XA Unspecified injury of right wrist, hand and finger(s), initial encounter: Secondary | ICD-10-CM | POA: Diagnosis present

## 2022-07-29 DIAGNOSIS — S52531A Colles' fracture of right radius, initial encounter for closed fracture: Secondary | ICD-10-CM | POA: Insufficient documentation

## 2022-07-29 DIAGNOSIS — I10 Essential (primary) hypertension: Secondary | ICD-10-CM | POA: Diagnosis not present

## 2022-07-29 DIAGNOSIS — M25531 Pain in right wrist: Secondary | ICD-10-CM | POA: Diagnosis not present

## 2022-07-29 DIAGNOSIS — R531 Weakness: Secondary | ICD-10-CM | POA: Insufficient documentation

## 2022-07-29 DIAGNOSIS — Z79899 Other long term (current) drug therapy: Secondary | ICD-10-CM | POA: Insufficient documentation

## 2022-07-29 DIAGNOSIS — R0902 Hypoxemia: Secondary | ICD-10-CM | POA: Diagnosis not present

## 2022-07-29 DIAGNOSIS — W19XXXA Unspecified fall, initial encounter: Secondary | ICD-10-CM | POA: Insufficient documentation

## 2022-07-29 DIAGNOSIS — Z7982 Long term (current) use of aspirin: Secondary | ICD-10-CM | POA: Diagnosis not present

## 2022-07-29 DIAGNOSIS — M79603 Pain in arm, unspecified: Secondary | ICD-10-CM | POA: Diagnosis not present

## 2022-07-29 LAB — BASIC METABOLIC PANEL
Anion gap: 5 (ref 5–15)
BUN: 15 mg/dL (ref 8–23)
CO2: 30 mmol/L (ref 22–32)
Calcium: 9 mg/dL (ref 8.9–10.3)
Chloride: 107 mmol/L (ref 98–111)
Creatinine, Ser: 0.75 mg/dL (ref 0.44–1.00)
GFR, Estimated: >60 mL/min (ref >60)
Glucose, Bld: 128 mg/dL — ABNORMAL HIGH (ref 70–99)
Potassium: 4.1 mmol/L (ref 3.5–5.1)
Sodium: 142 mmol/L (ref 135–145)

## 2022-07-29 LAB — CBC WITH DIFFERENTIAL/PLATELET
Abs Immature Granulocytes: 0.01 10*3/uL (ref 0.00–0.07)
Basophils Absolute: 0 10*3/uL (ref 0.0–0.1)
Basophils Relative: 0 %
Eosinophils Absolute: 0.1 10*3/uL (ref 0.0–0.5)
Eosinophils Relative: 1 %
HCT: 38.1 % (ref 36.0–46.0)
Hemoglobin: 12.4 g/dL (ref 12.0–15.0)
Immature Granulocytes: 0 %
Lymphocytes Relative: 12 %
Lymphs Abs: 0.8 10*3/uL (ref 0.7–4.0)
MCH: 32.7 pg (ref 26.0–34.0)
MCHC: 32.5 g/dL (ref 30.0–36.0)
MCV: 100.5 fL — ABNORMAL HIGH (ref 80.0–100.0)
Monocytes Absolute: 0.5 10*3/uL (ref 0.1–1.0)
Monocytes Relative: 8 %
Neutro Abs: 5.4 10*3/uL (ref 1.7–7.7)
Neutrophils Relative %: 79 %
Platelets: 218 10*3/uL (ref 150–400)
RBC: 3.79 MIL/uL — ABNORMAL LOW (ref 3.87–5.11)
RDW: 13 % (ref 11.5–15.5)
WBC: 6.9 10*3/uL (ref 4.0–10.5)
nRBC: 0 % (ref 0.0–0.2)

## 2022-07-29 LAB — URINALYSIS, ROUTINE W REFLEX MICROSCOPIC
Bacteria, UA: NONE SEEN
Bilirubin Urine: NEGATIVE
Glucose, UA: NEGATIVE mg/dL
Hgb urine dipstick: NEGATIVE
Ketones, ur: NEGATIVE mg/dL
Nitrite: NEGATIVE
Protein, ur: 30 mg/dL — AB
Specific Gravity, Urine: 1.026 (ref 1.005–1.030)
pH: 5 (ref 5.0–8.0)

## 2022-07-29 LAB — CBG MONITORING, ED: Glucose-Capillary: 88 mg/dL (ref 70–99)

## 2022-07-29 MED ORDER — OXYCODONE HCL 5 MG PO TABS
2.5000 mg | ORAL_TABLET | Freq: Once | ORAL | Status: AC
  Administered 2022-07-29: 2.5 mg via ORAL
  Filled 2022-07-29: qty 1

## 2022-07-29 MED ORDER — ACETAMINOPHEN 500 MG PO TABS
1000.0000 mg | ORAL_TABLET | Freq: Once | ORAL | Status: AC
  Administered 2022-07-29: 1000 mg via ORAL
  Filled 2022-07-29: qty 2

## 2022-07-29 NOTE — ED Provider Notes (Signed)
[name] DEPT Provider Note   CSN: [id number] Arrival date & time: 07/29/22  0703     History  Chief Complaint  Patient presents with   Weakness    [name] is a 76 y.o. female.  76 yo F with a chief complaints of difficulty ambulating.  The patient had an outpatient wrist surgery yesterday.  Since then she has had difficulty getting up and going to the bathroom.  Normally walks with a cane at baseline.  She lives at home alone.  Had suffered a fall at the onset but no recent falls per the family.  Denies cough congestion or fever.   Weakness      Home Medications Prior to Admission medications   Medication Sig Start Date End Date Taking? Authorizing Provider  acetaminophen (TYLENOL) 650 MG CR tablet Take 650 mg by mouth every 8 (eight) hours as needed for pain.    [provider]  ARIPiprazole (ABILIFY) 10 MG tablet Take 10 mg by mouth in the morning. 11/23/18   [provider]  aspirin 81 MG chewable tablet Chew 1 tablet (81 mg total) by mouth 2 (two) times daily. Patient taking differently: Chew 81 mg by mouth in the morning. 06/13/21   Meredith Pel, MD  Calcium Carb-Cholecalciferol (CALCIUM 600+D3 PO) Take 1 tablet by mouth in the morning.    [provider]  celecoxib (CELEBREX) 100 MG capsule Take 1 capsule (100 mg total) by mouth daily as needed for mild pain. 07/20/21   Magnant, Gerrianne Scale, PA-C  cholecalciferol (VITAMIN D) 25 MCG (1000 UNIT) tablet Take 1,000 Units by mouth in the morning.    [provider]  DULoxetine (CYMBALTA) 60 MG capsule Take 60 mg by mouth 2 (two) times daily.    [provider]  gabapentin (NEURONTIN) 600 MG tablet Take 600 mg by mouth 2 (two) times daily. 07/14/16   [provider]  levothyroxine (SYNTHROID, LEVOTHROID) 25 MCG tablet Take 25 mcg by mouth daily before breakfast. 08/29/18   [provider]  Multiple Vitamin (MULTIVITAMIN WITH  MINERALS) TABS tablet Take 1 tablet by mouth in the morning. Centrum Silver for Women    [provider]  oxyCODONE (ROXICODONE) 5 MG immediate release tablet Take 1 tablet (5 mg total) by mouth every 4 (four) hours as needed for severe pain. 01/20/01   Delora Fuel, MD  pravastatin (PRAVACHOL) 40 MG tablet Take 40 mg by mouth daily.    [provider]  traZODone (DESYREL) 100 MG tablet Take 100 mg by mouth at bedtime.    [provider]  vitamin B-12 (CYANOCOBALAMIN) 500 MCG tablet Take 500 mcg by mouth in the morning.    [provider]      Allergies    Patient has no known allergies.    Review of Systems   Review of Systems  Neurological:  Positive for weakness.    Physical Exam Updated Vital Signs BP 128/76 (BP Location: Left Arm)   Pulse 74   Temp 99.1 F (37.3 C) (Oral)   Resp 14   Ht '5\' 2"'$  (1.575 m)   Wt 74.8 kg   SpO2 100%   BMI 30.18 kg/m  Physical Exam Vitals and nursing note reviewed.  Constitutional:      General: She is not in acute distress.    Appearance: She is well-developed. She is not diaphoretic.  HENT:     Head: Normocephalic and atraumatic.  Eyes:  Pupils: Pupils are equal, round, and reactive to light.  Cardiovascular:     Rate and Rhythm: Normal rate and regular rhythm.     Heart sounds: No murmur heard.    No friction rub. No gallop.  Pulmonary:     Effort: Pulmonary effort is normal.     Breath sounds: No wheezing or rales.  Abdominal:     General: There is no distension.     Palpations: Abdomen is soft.     Tenderness: There is no abdominal tenderness.  Musculoskeletal:        General: No tenderness.     Cervical back: Normal range of motion and neck supple.     Comments: Arm is held weight below the waist.  There are is significant edema to the hand.  Not obviously erythematous.  Cap refill less than 2 seconds.  Swelling distally.  Wound with mild bloody ooze.  No appreciable erythema or purulence.   Skin:    General: Skin is warm and dry.  Neurological:     Mental Status: She is alert and oriented to person, place, and time.  Psychiatric:        Behavior: Behavior normal.     ED Results / Procedures / Treatments   Labs (all labs ordered are listed, but only abnormal results are displayed) Labs Reviewed  BASIC METABOLIC PANEL - Abnormal; Notable for the following components:      Result Value   Glucose, Bld 128 (*)    All other components within normal limits  CBC WITH DIFFERENTIAL/PLATELET - Abnormal; Notable for the following components:   RBC 3.79 (*)    MCV 100.5 (*)    All other components within normal limits  URINALYSIS, ROUTINE W REFLEX MICROSCOPIC - Abnormal; Notable for the following components:   APPearance HAZY (*)    Protein, ur 30 (*)    Leukocytes,Ua SMALL (*)    Non Squamous Epithelial 0-5 (*)    All other components within normal limits  CBG MONITORING, ED    EKG EKG Interpretation  Date/Time:  Thursday July 29 2022 07:17:33 EDT Ventricular Rate:  90 PR Interval:    QRS Duration: 94 QT Interval:  384 QTC Calculation: 468 R Axis:   -36 Text Interpretation: Normal sinus rhythm Left axis deviation Borderline abnrm T, anterolateral leads Otherwise no significant change Confirmed by Deno Etienne [phone]) on 07/29/2022 9:10:20 AM  Radiology VAS [place] LOWER EXTREMITY VENOUS (DVT) (7a-7p)  Result Date: 07/29/2022  Lower Venous DVT Study Patient Name:  [name]  Date of Exam:   07/29/2022 Medical Rec #: [id number]        Accession #:    [id number] Date of Birth: 07-08-1946        Patient Gender: F Patient Age:   70 years Exam Location:  [facility] Procedure:      VAS [place] LOWER EXTREMITY VENOUS (DVT) Referring Phys: [name] --------------------------------------------------------------------------------  Indications: Pain.  Risk Factors: None identified. Limitations: Poor ultrasound/tissue interface. Comparison Study: No prior studies.  Performing Technologist: Oliver Hum RVT  Examination Guidelines: A complete evaluation includes B-mode imaging, spectral Doppler, color Doppler, and power Doppler as needed of all accessible portions of each vessel. Bilateral testing is considered an integral part of a complete examination. Limited examinations for reoccurring indications may be performed as noted. The reflux portion of the exam is performed with the patient in reverse Trendelenburg.  +---------+---------------+---------+-----------+----------+--------------+ RIGHT    CompressibilityPhasicitySpontaneityPropertiesThrombus Aging +---------+---------------+---------+-----------+----------+--------------+ CFV  Full           Yes      Yes                                 +---------+---------------+---------+-----------+----------+--------------+ SFJ      Full                                                        +---------+---------------+---------+-----------+----------+--------------+ FV Prox  Full                                                        +---------+---------------+---------+-----------+----------+--------------+ FV Mid   Full                                                        +---------+---------------+---------+-----------+----------+--------------+ FV DistalFull           Yes      Yes                                 +---------+---------------+---------+-----------+----------+--------------+ PFV      Full                                                        +---------+---------------+---------+-----------+----------+--------------+ POP      Full           Yes      Yes                                 +---------+---------------+---------+-----------+----------+--------------+ PTV      Full                                                        +---------+---------------+---------+-----------+----------+--------------+ PERO     Full                                                         +---------+---------------+---------+-----------+----------+--------------+   +----+---------------+---------+-----------+----------+--------------+ LEFTCompressibilityPhasicitySpontaneityPropertiesThrombus Aging +----+---------------+---------+-----------+----------+--------------+ CFV Full           Yes      Yes                                 +----+---------------+---------+-----------+----------+--------------+    Summary: RIGHT: - There is no  evidence of deep vein thrombosis in the lower extremity.  - No cystic structure found in the popliteal fossa.  LEFT: - No evidence of common femoral vein obstruction.  *See table(s) above for measurements and observations.    Preliminary    DG Foot Complete Right  Result Date: 07/28/2022 Please see detailed radiograph report in office note.  DG Foot Complete Left  Result Date: 07/28/2022 Please see detailed radiograph report in office note.   Procedures Procedures    Medications Ordered in ED Medications  acetaminophen (TYLENOL) tablet 1,000 mg (1,000 mg Oral Given 07/29/22 0853)  oxyCODONE (Oxy IR/ROXICODONE) immediate release tablet 2.5 mg (2.5 mg Oral Given 07/29/22 8882)    ED Course/ Medical Decision Making/ A&P                           Medical Decision Making Risk OTC drugs. Prescription drug management.   76 yo F with a chief complaints of right wrist pain and swelling.  This has been going on since she had her wrist repaired yesterday.  She unfortunately has been having significant difficulty taking care of herself at home and family brought in for possible rehab placement.  I discussed the case with Hilbert Odor, recommended removing the splint and placing in a slightly looser splint.  Elevating the extremity.  Lab work without anemia no significant electrolyte abnormality. Social work to work on placement.  The patients results and plan were reviewed and discussed.   Any x-rays  performed were independently reviewed by myself.   Differential diagnosis were considered with the presenting HPI.  Medications  acetaminophen (TYLENOL) tablet 1,000 mg (1,000 mg Oral Given 07/29/22 0853)  oxyCODONE (Oxy IR/ROXICODONE) immediate release tablet 2.5 mg (2.5 mg Oral Given 07/29/22 0853)    Vitals:   07/29/22 1200 07/29/22 1215 07/29/22 1300 07/29/22 1459  BP: 127/61 127/61 (!) 136/114 128/76  Pulse:  70 72 74  Resp:  '14 14 14  '$ Temp:    99.1 F (37.3 C)  TempSrc:    Oral  SpO2:  100% 96% 100%  Weight:      Height:        Final diagnoses:  Closed Colles' fracture of right radius, initial encounter           Final Clinical Impression(s) / ED Diagnoses Final diagnoses:  Closed Colles' fracture of right radius, initial encounter    Rx / DC Orders ED Discharge Orders     None         Deno Etienne, DO 07/29/22 1610

## 2022-07-29 NOTE — ED Triage Notes (Signed)
Pt brought to ED via [facility] for weakness. Pt's family called and wanted pt placed in rehab facility secondary to a fall and wrist surgery she was discharged yesterday for. Pt denies any new trauma. States she is having pain at surgical site.

## 2022-07-29 NOTE — Progress Notes (Signed)
Orthopedic Tech Progress Note Patient Details:  [name] [birth year] [id number]  Patient ID: [name], female   DOB: 1946/02/17, 76 y.o.   MRN: [id number]  [name] 07/29/2022, 9:34 AM Existing sugartong splint removed from right arm. Sugartong re applied .

## 2022-07-29 NOTE — Progress Notes (Signed)
Right lower extremity venous duplex has been completed. Preliminary results can be found in CV Proc through chart review.  Results were given to [name] PA.  07/29/22 8:47 AM [name] RVT

## 2022-07-29 NOTE — Progress Notes (Signed)
Transition of Care [facility]) - Emergency Department Mini Assessment   Patient Details  Name: [name]: [id number] Date of Birth: 10/06/1946  Transition of Care [facility]) CM/SW Contact:    Kimber Relic, LCSW Phone Number: 07/29/2022, 11:53 AM   Clinical Narrative: PT will see pt to complete eval. Pt states "my daughter wants me to be placed in an old people home." This CSW informed that we do not place into ALF, Retirement homes, etc but that we could place for STR at [facility] and the pt can work with the facility to transition into [place]. TOC following.   ED Mini Assessment: What brought you to the Emergency Department? : Post Op Wrist Surgery  Barriers to Discharge: No Barriers Identified     Means of departure: Not know       Patient Contact and Communications Key Contact 1: April - daughter - [phone]   Spoke with: Patient Contact Date: 07/29/22,   Contact time: 1115 Contact Phone Number: April - daughter - [phone]           Admission diagnosis:  weakness Patient Active Problem List   Diagnosis Date Noted   E coli bacteremia    Acute lower UTI    Sepsis due to undetermined organism ([place]) 09/19/2021   Hypokalemia 09/19/2021   Leukopenia 09/19/2021   Prolonged QT interval 09/19/2021   Arthritis of right knee    UTI (urinary tract infection) 06/26/2021   Severe sepsis ([place]) 65/78/4696   Acute metabolic encephalopathy 29/52/8413   Hypothyroid 06/25/2021   S/P total knee arthroplasty, right 06/09/2021   Fever    Staphylococcus aureus bacteremia with sepsis ([place])    SIRS (systemic inflammatory response syndrome) ([place]) 11/30/2020   Lumbar radiculopathy 10/10/2019   Bilateral stenosis of lateral recess of lumbar spine 10/10/2019   Hemorrhoids, external without complications 24/40/1027   Depression    Anxiety    Rectal bleeding    History of radiation therapy    Malignant neoplasm of anal canal ([place]) 11/15/2011   Hemorrhoids, internal, with  bleeding 09/07/2011   Acquired anal stenosis 09/07/2011   Hyperlipidemia 09/07/2011   Family history of breast cancer in sister 09/07/2011   PCP:  [name], MD Pharmacy:   [facility], [address] AT [facility] [place] [address] Phone: [phone] Fax: [phone]

## 2022-07-29 NOTE — Social Work (Signed)
CSW spoke to Pt about bed offers. Pt wants to wait to talk to daughter, TOC will follow up in the AM.

## 2022-07-29 NOTE — Social Work (Signed)
TOC will follow up in AM on possible bed offers.

## 2022-07-29 NOTE — Social Work (Signed)
CSW sent out FL2, CSW called the daughter twice N/A. TOC will continue to follow.

## 2022-07-29 NOTE — ED Provider Triage Note (Signed)
Emergency Medicine Provider Triage Evaluation Note  [name] , a 76 y.o. female  was evaluated in triage.  Pt complains of pain.  Patient fell and broke her right wrist and had surgery yesterday.  Family report that she is having increasing pain at the surgical site as well as weakness and they would like patient to be placed in a rehab facility.  Patient currently complaint is pain to her right lower extremity since yesterday.  No report of any fever.  Review of Systems  Positive: As above Negative: As above  Physical Exam  BP (!) 157/73 (BP Location: Right Arm)   Pulse 89   Temp 98.8 F (37.1 C) (Oral)   Resp 18   SpO2 99%  Gen:   Awake, no distress   Resp:  Normal effort  MSK:   Moves extremities without difficulty  Other:    Medical Decision Making  Medically screening exam initiated at 7:40 AM.  Appropriate orders placed.  [name] was informed that the remainder of the evaluation will be completed by another provider, this initial triage assessment does not replace that evaluation, and the importance of remaining in the ED until their evaluation is complete.  Need to have splint reassess. DVT study of RLE (likely low yield)   [name], PA-C 07/29/22 [phone]

## 2022-07-29 NOTE — NC FL2 (Signed)
[place] LEVEL OF CARE SCREENING TOOL     IDENTIFICATION  Patient Name: [name] Birthdate: 1946/02/05 Sex: female Admission Date (Current Location): 07/29/2022  [facility] and [place] Number:  Herbalist and Address:  [facility],  [place], [place]      Provider Number: [id number]  Attending Physician Name and Address:  Deno Etienne, DO  Relative Name and Phone Number:       Current Level of Care: Hospital Recommended Level of Care: [place] Prior Approval Number:    Date Approved/Denied: 07/29/22 PASRR Number: [id number]  Discharge Plan: SNF    Current Diagnoses: Patient Active Problem List   Diagnosis Date Noted   E coli bacteremia    Acute lower UTI    Sepsis due to undetermined organism ([place]) 09/19/2021   Hypokalemia 09/19/2021   Leukopenia 09/19/2021   Prolonged QT interval 09/19/2021   Arthritis of right knee    UTI (urinary tract infection) 06/26/2021   Severe sepsis ([place]) 83/38/2505   Acute metabolic encephalopathy 39/76/7341   Hypothyroid 06/25/2021   S/P total knee arthroplasty, right 06/09/2021   Fever    Staphylococcus aureus bacteremia with sepsis ([address])    SIRS (systemic inflammatory response syndrome) ([place]) 11/30/2020   Lumbar radiculopathy 10/10/2019   Bilateral stenosis of lateral recess of lumbar spine 10/10/2019   Hemorrhoids, external without complications 93/79/0240   Depression    Anxiety    Rectal bleeding    History of radiation therapy    Malignant neoplasm of anal canal ([place]) 11/15/2011   Hemorrhoids, internal, with bleeding 09/07/2011   Acquired anal stenosis 09/07/2011   Hyperlipidemia 09/07/2011   Family history of breast cancer in sister 09/07/2011    Orientation RESPIRATION BLADDER Height & Weight     Self, Time, Situation, Place  Normal Continent Weight: 165 lb (74.8 kg) Height:  '5\' 2"'$  (157.5 cm)  BEHAVIORAL SYMPTOMS/MOOD NEUROLOGICAL BOWEL  NUTRITION STATUS      Continent Diet  AMBULATORY STATUS COMMUNICATION OF NEEDS Skin   Limited Assist Verbally Normal                       Personal Care Assistance Level of Assistance  Bathing, Feeding, Dressing Bathing Assistance: Limited assistance Feeding assistance: Independent Dressing Assistance: Limited assistance     Functional Limitations Info  Sight, Hearing, Speech Sight Info: Adequate Hearing Info: Adequate Speech Info: Adequate    SPECIAL CARE FACTORS FREQUENCY                       Contractures Contractures Info: Not present    Additional Factors Info  Code Status, Allergies Code Status Info: prior Allergies Info: none           Current Medications (07/29/2022):  This is the current hospital active medication list No current facility-administered medications for this encounter.   Current Outpatient Medications  Medication Sig Dispense Refill   acetaminophen (TYLENOL) 650 MG CR tablet Take 650 mg by mouth every 8 (eight) hours as needed for pain.     ARIPiprazole (ABILIFY) 10 MG tablet Take 10 mg by mouth in the morning.     aspirin 81 MG chewable tablet Chew 1 tablet (81 mg total) by mouth 2 (two) times daily. (Patient taking differently: Chew 81 mg by mouth in the morning.) 30 tablet 0   Calcium Carb-Cholecalciferol (CALCIUM 600+D3 PO) Take 1 tablet by mouth in the morning.  celecoxib (CELEBREX) 100 MG capsule Take 1 capsule (100 mg total) by mouth daily as needed for mild pain. 30 capsule 0   cholecalciferol (VITAMIN D) 25 MCG (1000 UNIT) tablet Take 1,000 Units by mouth in the morning.     DULoxetine (CYMBALTA) 60 MG capsule Take 60 mg by mouth 2 (two) times daily.     gabapentin (NEURONTIN) 600 MG tablet Take 600 mg by mouth 2 (two) times daily.     levothyroxine (SYNTHROID, LEVOTHROID) 25 MCG tablet Take 25 mcg by mouth daily before breakfast.     Multiple Vitamin (MULTIVITAMIN WITH MINERALS) TABS tablet Take 1 tablet by mouth in the  morning. Centrum Silver for Women     oxyCODONE (ROXICODONE) 5 MG immediate release tablet Take 1 tablet (5 mg total) by mouth every 4 (four) hours as needed for severe pain. 20 tablet 0   pravastatin (PRAVACHOL) 40 MG tablet Take 40 mg by mouth daily.     traZODone (DESYREL) 100 MG tablet Take 100 mg by mouth at bedtime.     vitamin B-12 (CYANOCOBALAMIN) 500 MCG tablet Take 500 mcg by mouth in the morning.       Discharge Medications: Please see discharge summary for a list of discharge medications.  Relevant Imaging Results:  Relevant Lab Results:   Additional Information [id number] PT SSI # [place]  Salli Real [place], [place]

## 2022-07-29 NOTE — Progress Notes (Signed)
Awaiting PT eval.  

## 2022-07-29 NOTE — Evaluation (Signed)
Physical Therapy Evaluation Patient Details Name: [name]: [id number] DOB: 1946-08-28 Today's Date: 07/29/2022  History of Present Illness  [name] a 76 y.o. female presents for rehab facility placement. Pt with recent fall and identified comminuted distal R radius fracture with intra-articular involvement also ulnar styloid fracture at recent admission 07/19/22. Per chart review, pt had wrist surgery yesterday and now family wanting placement. PMH: anxiety, depression, lumbar radiculopathy  Clinical Impression  Pt admitted with above diagnosis. Per chart review, pt from home, recent fall with R wrist fracture, surgical fixation 9/27 and now family seeking SNF placement. Pt pleasantly confused, able to follow commands, needing cues for sequencing, maintains RUE NWB during eval due to lack of precautions noted and pt unable to provide insight. Pt needing mod A to bring BLE and trunk up to sitting at edge of reclined stretcher and mod A for STS and to pivot to chair at bedside, cues for sequencing. Recommending ST SNF prior to return home with family support. Will continue to assess DME needs. Pt currently with functional limitations due to the deficits listed below (see PT Problem List). Pt will benefit from skilled PT to increase their independence and safety with mobility to allow discharge to the venue listed below.          Recommendations for follow up therapy are one component of a multi-disciplinary discharge planning process, led by the attending physician.  Recommendations may be updated based on patient status, additional functional criteria and insurance authorization.  Follow Up Recommendations Skilled nursing-short term rehab (<3 hours/day) Can patient physically be transported by private vehicle: No    Assistance Recommended at Discharge Frequent or constant Supervision/Assistance  Patient can return home with the following  A lot of help with walking and/or  transfers;A lot of help with bathing/dressing/bathroom;Assistance with cooking/housework;Assist for transportation;Help with stairs or ramp for entrance    Equipment Recommendations Other (comment) (TBD)  Recommendations for Other Services       Functional Status Assessment Patient has had a recent decline in their functional status and demonstrates the ability to make significant improvements in function in a reasonable and predictable amount of time.     Precautions / Restrictions Precautions Precautions: Fall Precaution Comments: no post-op instructions noted, pt unable to state WB status or sling wear; maintained NWB with sling on at eval Required Braces or Orthoses: Sling      Mobility  Bed Mobility Overal bed mobility: Needs Assistance  General bed mobility comments: pt in inclinced stretcher, mod A to bring BLE to edge into sitting and to return to supine, assist with trunk due to RUE not assisting    Transfers Overall transfer level: Needs assistance Equipment used: 1 person hand held assist Transfers: Sit to/from Stand, Bed to chair/wheelchair/BSC Sit to Stand: Mod assist  Step pivot transfers: Mod assist  General transfer comment: mod A to power to stand with LUE assisting to power to stand, slow pivoting steps to chair at bedside, increased time and cues for sequencing    Ambulation/Gait  General Gait Details: not attempted  Stairs            Wheelchair Mobility    Modified Rankin (Stroke Patients Only)       Balance Overall balance assessment: Needs assistance, History of Falls Sitting-balance support: Single extremity supported Sitting balance-Leahy Scale: Fair  Standing balance support: During functional activity, Single extremity supported Standing balance-Leahy Scale: Poor Standing balance comment: reliant on LUE support with transfers  Pertinent Vitals/Pain Pain Assessment Pain Assessment: Faces Faces Pain Scale: Hurts little  more Pain Location: R UE Pain Descriptors / Indicators: Grimacing, Guarding Pain Intervention(s): Limited activity within patient's tolerance, Monitored during session, Repositioned    Home Living Family/patient expects to be discharged to:: Skilled nursing facility  Additional Comments: Per chart review, family wants SNF placement.    Prior Function Prior Level of Function : Patient poor historian/Family not available      Hand Dominance        Extremity/Trunk Assessment   Upper Extremity Assessment Upper Extremity Assessment: Defer to OT evaluation    Lower Extremity Assessment Lower Extremity Assessment: Generalized weakness (AROM WFL, strength grossly 3+/5, denies numbness/tingling)    Cervical / Trunk Assessment Cervical / Trunk Assessment: Kyphotic  Communication   Communication: No difficulties  Cognition Arousal/Alertness: Awake/alert Behavior During Therapy: WFL for tasks assessed/performed Overall Cognitive Status: No family/caregiver present to determine baseline cognitive functioning  General Comments: Pt states name and DOB appropriately, unsure where she is or what is going on. Pt pleasantly confused, unable to offer insight to recent surgery, PLOF or home setup. Pt smiling, engages with therapist, follows commands well.        General Comments      Exercises     Assessment/Plan    PT Assessment Patient needs continued PT services  PT Problem List Decreased strength;Decreased range of motion;Decreased activity tolerance;Decreased balance;Decreased mobility;Decreased knowledge of use of DME;Decreased safety awareness;Pain       PT Treatment Interventions DME instruction;Gait training;Functional mobility training;Therapeutic activities;Therapeutic exercise;Neuromuscular re-education;Balance training;Patient/family education;Modalities    PT Goals (Current goals can be found in the Care Plan section)  Acute Rehab PT Goals Patient Stated Goal: "I'm  ready to eat" PT Goal Formulation: With patient Time For Goal Achievement: 08/12/22 Potential to Achieve Goals: Good    Frequency Min 2X/week     Co-evaluation               AM-PAC PT "6 Clicks" Mobility  Outcome Measure Help needed turning from your back to your side while in a flat bed without using bedrails?: A Lot Help needed moving from lying on your back to sitting on the side of a flat bed without using bedrails?: A Lot Help needed moving to and from a bed to a chair (including a wheelchair)?: A Lot Help needed standing up from a chair using your arms (e.g., wheelchair or bedside chair)?: A Lot Help needed to walk in hospital room?: Total Help needed climbing 3-5 steps with a railing? : Total 6 Click Score: 10    End of Session Equipment Utilized During Treatment: Gait belt Activity Tolerance: Patient limited by pain Patient left: in bed;with call bell/phone within reach Nurse Communication: Mobility status PT Visit Diagnosis: Unsteadiness on feet (R26.81);Other abnormalities of gait and mobility (R26.89);Muscle weakness (generalized) (M62.81);History of falling (Z91.81);Pain Pain - Right/Left: Right Pain - part of body: Arm    Time: 1135-1151 PT Time Calculation (min) (ACUTE ONLY): 16 min   Charges:   PT Evaluation $PT Eval Low Complexity: 1 Low           Tori Sobia Karger PT, DPT 07/29/22, 12:40 PM

## 2022-07-29 NOTE — Evaluation (Signed)
Occupational Therapy Evaluation Patient Details Name: [name]: [id number] DOB: December 08, 1945 Today's Date: 07/29/2022   History of Present Illness [name] a 76 y.o. female presents for rehab facility placement. Pt with recent fall and identified comminuted distal R radius fracture with intra-articular involvement also ulnar styloid fracture at recent admission 07/19/22. Per chart review, pt had wrist surgery yesterday and now family wanting placement. PMH: anxiety, depression, lumbar radiculopathy   Clinical Impression   [name] is a 76 year old woman admitted to hospital with above medical history and presents with generalized weakness, decreased activity tolerance, impaired balance, non functional use of right dominant upper extremity and pain. She is grossly able to move her right shoulder but otherwise mobility is immobilized by splint. Her fingers are swollen and discolored but she grossly flex them but reports pain. Patient exhibits impaired cognition though she is grossly alert and oriented though she cannot come up with the year or the President. She is drowsy, stares off into space, exhibits slow processing and difficulty with problem solving. Per PT patient needing mod assistance for transfers. Patient needing max-total assist for LB ADLs and toileting.  She exhibits a left lateral lean in sitting  despite cues to sit up and positioning with pillows. Patient reports she is typically independent, lives alone, uses a cane and drives. She has been attending OP PT appointments for low back and right knee pain. Unsure of reason for patient's altered mental status. Patient will benefit from skilled OT services while in hospital to improve deficits and learn compensatory strategies as needed in order to return to PLOF.  Recommend short term rehab at discharge        Recommendations for follow up therapy are one component of a multi-disciplinary discharge planning process,  led by the attending physician.  Recommendations may be updated based on patient status, additional functional criteria and insurance authorization.   Follow Up Recommendations  Skilled nursing-short term rehab (<3 hours/day)    Assistance Recommended at Discharge Frequent or constant Supervision/Assistance  Patient can return home with the following A lot of help with walking and/or transfers;A lot of help with bathing/dressing/bathroom;Assistance with cooking/housework;Direct supervision/assist for financial management;Help with stairs or ramp for entrance;Direct supervision/assist for medications management;Assist for transportation    Functional Status Assessment  Patient has had a recent decline in their functional status and demonstrates the ability to make significant improvements in function in a reasonable and predictable amount of time.  Equipment Recommendations  Other (comment) (TBD)    Recommendations for Other Services       Precautions / Restrictions Precautions Precautions: Fall Precaution Comments: no post-op instructions noted, pt unable to state WB status or sling wear; maintained NWB with sling on at eval Required Braces or Orthoses: Sling Restrictions Other Position/Activity Restrictions: Presumed NWB through wrist      Mobility Bed Mobility                    Transfers                          Balance Overall balance assessment: History of Falls                                         ADL either performed or assessed with clinical judgement   ADL Overall ADL's :  Needs assistance/impaired Eating/Feeding: Set up;Sitting   Grooming: Set up;Sitting;Wash/dry face;Oral care   Upper Body Bathing: Moderate assistance;Sitting   Lower Body Bathing: Maximal assistance;Sitting/lateral leans   Upper Body Dressing : Maximal assistance;Sitting   Lower Body Dressing: Total assistance;Bed level   Toilet Transfer: Moderate  assistance;BSC/3in1;+2 for safety/equipment;Stand-pivot   Toileting- Clothing Manipulation and Hygiene: Sit to/from stand;Maximal assistance       Functional mobility during ADLs: Moderate assistance;+2 for safety/equipment       Vision Baseline Vision/History: 1 Wears glasses Patient Visual Report: No change from baseline       Perception     Praxis      Pertinent Vitals/Pain Pain Assessment Pain Assessment: Faces Faces Pain Scale: Hurts whole lot Pain Location: R UE Pain Descriptors / Indicators: Grimacing, Guarding Pain Intervention(s): Monitored during session     Hand Dominance Right   Extremity/Trunk Assessment Upper Extremity Assessment Upper Extremity Assessment: RUE deficits/detail;LUE deficits/detail RUE Deficits / Details: sugartong splint in place, abel to grossly wiggle fingers that are swollend and discolored. Sugartong splint just replaced in ED to improve comfort. Pain in fingers. gross functional ROM in shoulder. RUE: Unable to fully assess due to immobilization LUE Deficits / Details: WFL ROM, strength and fcoordination of LUE. 5/5 elbow strength LUE Sensation: WNL LUE Coordination: WNL   Lower Extremity Assessment Lower Extremity Assessment: Defer to PT evaluation   Cervical / Trunk Assessment Cervical / Trunk Assessment: Kyphotic   Communication Communication Communication: No difficulties   Cognition   Behavior During Therapy: WFL for tasks assessed/performed Overall Cognitive Status: No family/caregiver present to determine baseline cognitive functioning                                 General Comments: Patient alert to self, Hospital (not which one), month and date. Cannot come up with year despite cue and increased time. Cannot state who the President is. She exhibits slow processing, a tendency to drift off (though she is sleepy and wants a nap). When asked when she retired it took excessive amount of time to report she was  at age of 68 - repetively saying it was in year 27. Obvious impaired cogntiion but unsure of baseline. She reports she lived alone and was driving herself to OP PT. From an OT evaluation a year ago patient reproted some memory deficits.     General Comments       Exercises     Shoulder Instructions      Home Living Family/patient expects to be discharged to:: Skilled nursing facility                                 Additional Comments: Lives alone      Prior Functioning/Environment Prior Level of Function : Patient poor historian/Family not available             Mobility Comments: patient reports she uses a cane - OP PT notes support this. ADLs Comments: Patient reports she lives alone and is independent with ADLs and drives        OT Problem List: Decreased strength;Decreased range of motion;Decreased activity tolerance;Impaired balance (sitting and/or standing);Decreased cognition;Decreased safety awareness;Decreased knowledge of use of DME or AE;Decreased knowledge of precautions;Pain;Impaired UE functional use;Obesity;Increased edema      OT Treatment/Interventions: Self-care/ADL training;Therapeutic exercise;DME and/or AE instruction;Cognitive remediation/compensation;Therapeutic activities;Balance training;Patient/family education  OT Goals(Current goals can be found in the care plan section) Acute Rehab OT Goals OT Goal Formulation: Patient unable to participate in goal setting Time For Goal Achievement: 08/12/22 Potential to Achieve Goals: Good  OT Frequency: Min 2X/week    Co-evaluation              AM-PAC OT "6 Clicks" Daily Activity     Outcome Measure Help from another person eating meals?: A Little Help from another person taking care of personal grooming?: A Little Help from another person toileting, which includes using toliet, bedpan, or urinal?: Total Help from another person bathing (including washing, rinsing, drying)?: A  Lot Help from another person to put on and taking off regular upper body clothing?: A Lot Help from another person to put on and taking off regular lower body clothing?: Total 6 Click Score: 12   End of Session Nurse Communication:  (can remove o2 sat for time being)  Activity Tolerance: Patient limited by pain Patient left: in chair  OT Visit Diagnosis: Pain;History of falling (Z91.81) Pain - Right/Left: Right Pain - part of body: Hand                Time: 6144-3154 OT Time Calculation (min): 21 min Charges:  OT General Charges $OT Visit: 1 Visit OT Evaluation $OT Eval Low Complexity: 1 Low  Gustavo Lah, OTR/L [place]  Office [phone]   Lenward Chancellor 07/29/2022, 3:52 PM

## 2022-07-30 DIAGNOSIS — E039 Hypothyroidism, unspecified: Secondary | ICD-10-CM | POA: Diagnosis not present

## 2022-07-30 DIAGNOSIS — F419 Anxiety disorder, unspecified: Secondary | ICD-10-CM | POA: Diagnosis not present

## 2022-07-30 DIAGNOSIS — E46 Unspecified protein-calorie malnutrition: Secondary | ICD-10-CM | POA: Diagnosis not present

## 2022-07-30 DIAGNOSIS — M25531 Pain in right wrist: Secondary | ICD-10-CM | POA: Diagnosis not present

## 2022-07-30 DIAGNOSIS — E785 Hyperlipidemia, unspecified: Secondary | ICD-10-CM | POA: Diagnosis not present

## 2022-07-30 DIAGNOSIS — I10 Essential (primary) hypertension: Secondary | ICD-10-CM | POA: Diagnosis not present

## 2022-07-30 DIAGNOSIS — M62838 Other muscle spasm: Secondary | ICD-10-CM | POA: Diagnosis not present

## 2022-07-30 DIAGNOSIS — Z4789 Encounter for other orthopedic aftercare: Secondary | ICD-10-CM | POA: Diagnosis not present

## 2022-07-30 DIAGNOSIS — G9009 Other idiopathic peripheral autonomic neuropathy: Secondary | ICD-10-CM | POA: Diagnosis not present

## 2022-07-30 DIAGNOSIS — E559 Vitamin D deficiency, unspecified: Secondary | ICD-10-CM | POA: Diagnosis not present

## 2022-07-30 DIAGNOSIS — S52531D Colles' fracture of right radius, subsequent encounter for closed fracture with routine healing: Secondary | ICD-10-CM | POA: Diagnosis not present

## 2022-07-30 DIAGNOSIS — M6281 Muscle weakness (generalized): Secondary | ICD-10-CM | POA: Diagnosis not present

## 2022-07-30 DIAGNOSIS — Z7401 Bed confinement status: Secondary | ICD-10-CM | POA: Diagnosis not present

## 2022-07-30 DIAGNOSIS — S52531A Colles' fracture of right radius, initial encounter for closed fracture: Secondary | ICD-10-CM | POA: Diagnosis not present

## 2022-07-30 DIAGNOSIS — D638 Anemia in other chronic diseases classified elsewhere: Secondary | ICD-10-CM | POA: Diagnosis not present

## 2022-07-30 DIAGNOSIS — F32A Depression, unspecified: Secondary | ICD-10-CM | POA: Diagnosis not present

## 2022-07-30 DIAGNOSIS — M48062 Spinal stenosis, lumbar region with neurogenic claudication: Secondary | ICD-10-CM | POA: Diagnosis not present

## 2022-07-30 DIAGNOSIS — K5900 Constipation, unspecified: Secondary | ICD-10-CM | POA: Diagnosis not present

## 2022-07-30 DIAGNOSIS — R531 Weakness: Secondary | ICD-10-CM | POA: Diagnosis not present

## 2022-07-30 DIAGNOSIS — W19XXXA Unspecified fall, initial encounter: Secondary | ICD-10-CM | POA: Diagnosis not present

## 2022-07-30 MED ORDER — GABAPENTIN 300 MG PO CAPS
600.0000 mg | ORAL_CAPSULE | Freq: Two times a day (BID) | ORAL | Status: DC
  Administered 2022-07-30: 600 mg via ORAL
  Filled 2022-07-30: qty 2

## 2022-07-30 MED ORDER — DULOXETINE HCL 30 MG PO CPEP
120.0000 mg | ORAL_CAPSULE | Freq: Every day | ORAL | Status: DC
  Administered 2022-07-30: 120 mg via ORAL
  Filled 2022-07-30: qty 4

## 2022-07-30 MED ORDER — ONDANSETRON 4 MG PO TBDP
4.0000 mg | ORAL_TABLET | Freq: Once | ORAL | Status: AC
  Administered 2022-07-30: 4 mg via ORAL
  Filled 2022-07-30: qty 1

## 2022-07-30 MED ORDER — POTASSIUM CHLORIDE ER 10 MEQ PO TBCR
10.0000 meq | EXTENDED_RELEASE_TABLET | Freq: Two times a day (BID) | ORAL | Status: DC
  Administered 2022-07-30: 10 meq via ORAL
  Filled 2022-07-30 (×2): qty 1

## 2022-07-30 MED ORDER — LEVOTHYROXINE SODIUM 25 MCG PO TABS: 25.0000 ug | ORAL_TABLET | Freq: Every day | ORAL | Status: DC

## 2022-07-30 MED ORDER — METHOCARBAMOL 500 MG PO TABS: 500.0000 mg | ORAL_TABLET | Freq: Every day | ORAL | Status: DC | PRN

## 2022-07-30 MED ORDER — PRAVASTATIN SODIUM 20 MG PO TABS
40.0000 mg | ORAL_TABLET | Freq: Every day | ORAL | Status: DC
  Filled 2022-07-30: qty 2

## 2022-07-30 MED ORDER — FUROSEMIDE 40 MG PO TABS
40.0000 mg | ORAL_TABLET | Freq: Every day | ORAL | Status: DC
  Filled 2022-07-30: qty 1

## 2022-07-30 MED ORDER — TRAZODONE HCL 100 MG PO TABS: 200.0000 mg | ORAL_TABLET | Freq: Every day | ORAL | Status: DC

## 2022-07-30 NOTE — ED Notes (Signed)
Patient was readjusted in bed.  

## 2022-07-30 NOTE — ED Notes (Signed)
Called Report to [employer] in [place].

## 2022-07-30 NOTE — ED Notes (Signed)
Patient states she wanted to walk around. I advised the patient is was 1am and encouraged her to rest and sleep.

## 2022-07-30 NOTE — Discharge Instructions (Signed)
Patient is to go to [facility] today

## 2022-07-30 NOTE — Progress Notes (Addendum)
Pt's Auth was approved. [facility] is able to admit the pt today. TOC following to receive report # and room #.    Addend @ 11:29 AM This CSW has made EDP, Dr. Roderic Palau, and Eustaquio Maize, RN, aware. The facility will be ready to receive the pt at 2 PM or later. This CSW has also contacted the pt's daughter, April, who is [facility] POA to inform of d/c plan. [facility] called for transport.

## 2022-07-30 NOTE — Progress Notes (Addendum)
This CSW went to speak with the pt to present bed offers. Pt asked this CSW to contact her daughter who is her POA. This CSW attempted to contact the pt's daughter, April, without success. Left a HIPAA Compliant voicemail.   Addend @ 10:34 AM This CSW spoke with the pt's daughter and the pt in the room. The pt's daughter stated she only wants her mom to be placed for STR and understands that she has to have Medicaid in order to be placed for LTC. The pt's daughter, April, inquired about [facility] and stated they would like for her mother to go back there since she's been there previously. This CSW faxed the pt's information out to [facility] and contacted the admissions rep, Kia. Kia was able to extend a bed offer. This CSW presented the pt and her daughter with the bed offers and they accepted the offer from [facility]. This CSW has accepted [facility] in the [place] and began the [place]. This CSW confirmed that the pt can be admitted over the weekend after receiving ins Auth approval. TOC following.

## 2022-07-30 NOTE — ED Notes (Signed)
Patient discharged off unit per provider. Patient alert, cooperative, no s/s of distress. Discharge and belongings given to [facility] staff for transport. Patient off unit on stretcher. Patient transported by [facility].

## 2022-07-30 NOTE — ED Provider Notes (Signed)
Emergency Medicine Observation Re-evaluation Note  [name] is a 76 y.o. female, seen on rounds today.  Pt initially presented to the ED for complaints of Weakness Currently, the patient is considering placement for difficulty walking.  Physical Exam  BP 131/65   Pulse 80   Temp 99.8 F (37.7 C) (Oral)   Resp 18   Ht '5\' 2"'$  (1.575 m)   Wt 74.8 kg   SpO2 98%   BMI 30.18 kg/m  Physical Exam Alert in no distress  ED Course / MDM  EKG:EKG Interpretation  Date/Time:  Thursday July 29 2022 07:17:33 EDT Ventricular Rate:  90 PR Interval:    QRS Duration: 94 QT Interval:  384 QTC Calculation: 468 R Axis:   -36 Text Interpretation: Normal sinus rhythm Left axis deviation Borderline abnrm T, anterolateral leads Otherwise no significant change Confirmed by Deno Etienne [phone]) on 07/29/2022 9:10:20 AM  I have reviewed the labs performed to date as well as medications administered while in observation.  Recent changes in the last 24 hours include none.  Plan  Current plan is for contact her daughter first before arranging placement for walking difficulty.    Milton Ferguson, MD 07/30/22 [phone]

## 2022-08-02 ENCOUNTER — Telehealth: Payer: Self-pay

## 2022-08-02 DIAGNOSIS — F419 Anxiety disorder, unspecified: Secondary | ICD-10-CM | POA: Diagnosis not present

## 2022-08-02 DIAGNOSIS — E039 Hypothyroidism, unspecified: Secondary | ICD-10-CM | POA: Diagnosis not present

## 2022-08-02 DIAGNOSIS — I10 Essential (primary) hypertension: Secondary | ICD-10-CM | POA: Diagnosis not present

## 2022-08-02 DIAGNOSIS — K5900 Constipation, unspecified: Secondary | ICD-10-CM | POA: Diagnosis not present

## 2022-08-02 DIAGNOSIS — G9009 Other idiopathic peripheral autonomic neuropathy: Secondary | ICD-10-CM | POA: Diagnosis not present

## 2022-08-02 DIAGNOSIS — D638 Anemia in other chronic diseases classified elsewhere: Secondary | ICD-10-CM | POA: Diagnosis not present

## 2022-08-02 DIAGNOSIS — E785 Hyperlipidemia, unspecified: Secondary | ICD-10-CM | POA: Diagnosis not present

## 2022-08-02 DIAGNOSIS — S52531D Colles' fracture of right radius, subsequent encounter for closed fracture with routine healing: Secondary | ICD-10-CM | POA: Diagnosis not present

## 2022-08-02 DIAGNOSIS — F32A Depression, unspecified: Secondary | ICD-10-CM | POA: Diagnosis not present

## 2022-08-02 NOTE — Patient Outreach (Signed)
  Care Coordination   Close note  Visit Note   08/02/2022 Name: [name]: [id number] DOB: Aug 25, 1946  [name] is a 76 y.o. year old female who sees [name], MD for primary care. I  closed case  patient admitted to SNF.    What matters to the patients health and wellness today?   N/A     Goals Addressed   None     SDOH assessments and interventions completed:  No     Care Coordination Interventions Activated:  No  Care Coordination Interventions:  No, not indicated   Follow up plan: No further intervention required. Patient in SNF at this time.  Jone Baseman, RN, MSN [facility] Care Management Coordinator Direct Line [phone]

## 2022-08-03 DIAGNOSIS — E785 Hyperlipidemia, unspecified: Secondary | ICD-10-CM | POA: Diagnosis not present

## 2022-08-03 DIAGNOSIS — E039 Hypothyroidism, unspecified: Secondary | ICD-10-CM | POA: Diagnosis not present

## 2022-08-03 DIAGNOSIS — E559 Vitamin D deficiency, unspecified: Secondary | ICD-10-CM | POA: Diagnosis not present

## 2022-08-03 DIAGNOSIS — E46 Unspecified protein-calorie malnutrition: Secondary | ICD-10-CM | POA: Diagnosis not present

## 2022-08-06 DIAGNOSIS — I10 Essential (primary) hypertension: Secondary | ICD-10-CM | POA: Diagnosis not present

## 2022-08-06 DIAGNOSIS — K5900 Constipation, unspecified: Secondary | ICD-10-CM | POA: Diagnosis not present

## 2022-08-06 DIAGNOSIS — F32A Depression, unspecified: Secondary | ICD-10-CM | POA: Diagnosis not present

## 2022-08-06 DIAGNOSIS — S52531D Colles' fracture of right radius, subsequent encounter for closed fracture with routine healing: Secondary | ICD-10-CM | POA: Diagnosis not present

## 2022-08-06 DIAGNOSIS — E785 Hyperlipidemia, unspecified: Secondary | ICD-10-CM | POA: Diagnosis not present

## 2022-08-06 DIAGNOSIS — D638 Anemia in other chronic diseases classified elsewhere: Secondary | ICD-10-CM | POA: Diagnosis not present

## 2022-08-06 DIAGNOSIS — G9009 Other idiopathic peripheral autonomic neuropathy: Secondary | ICD-10-CM | POA: Diagnosis not present

## 2022-08-06 DIAGNOSIS — E039 Hypothyroidism, unspecified: Secondary | ICD-10-CM | POA: Diagnosis not present

## 2022-08-06 DIAGNOSIS — F419 Anxiety disorder, unspecified: Secondary | ICD-10-CM | POA: Diagnosis not present

## 2022-08-10 DIAGNOSIS — S52531D Colles' fracture of right radius, subsequent encounter for closed fracture with routine healing: Secondary | ICD-10-CM | POA: Diagnosis not present

## 2022-08-10 DIAGNOSIS — I10 Essential (primary) hypertension: Secondary | ICD-10-CM | POA: Diagnosis not present

## 2022-08-10 DIAGNOSIS — S52531A Colles' fracture of right radius, initial encounter for closed fracture: Secondary | ICD-10-CM | POA: Diagnosis not present

## 2022-08-10 DIAGNOSIS — M6281 Muscle weakness (generalized): Secondary | ICD-10-CM | POA: Diagnosis not present

## 2022-08-10 DIAGNOSIS — D638 Anemia in other chronic diseases classified elsewhere: Secondary | ICD-10-CM | POA: Diagnosis not present

## 2022-08-10 DIAGNOSIS — Z4789 Encounter for other orthopedic aftercare: Secondary | ICD-10-CM | POA: Diagnosis not present

## 2022-08-12 DIAGNOSIS — M6281 Muscle weakness (generalized): Secondary | ICD-10-CM | POA: Diagnosis not present

## 2022-08-12 DIAGNOSIS — M25531 Pain in right wrist: Secondary | ICD-10-CM | POA: Diagnosis not present

## 2022-08-12 DIAGNOSIS — S52531D Colles' fracture of right radius, subsequent encounter for closed fracture with routine healing: Secondary | ICD-10-CM | POA: Diagnosis not present

## 2022-08-13 DIAGNOSIS — I10 Essential (primary) hypertension: Secondary | ICD-10-CM | POA: Diagnosis not present

## 2022-08-13 DIAGNOSIS — M6281 Muscle weakness (generalized): Secondary | ICD-10-CM | POA: Diagnosis not present

## 2022-08-13 DIAGNOSIS — S52531D Colles' fracture of right radius, subsequent encounter for closed fracture with routine healing: Secondary | ICD-10-CM | POA: Diagnosis not present

## 2022-08-13 DIAGNOSIS — E785 Hyperlipidemia, unspecified: Secondary | ICD-10-CM | POA: Diagnosis not present

## 2022-08-13 DIAGNOSIS — M25531 Pain in right wrist: Secondary | ICD-10-CM | POA: Diagnosis not present

## 2022-08-13 DIAGNOSIS — F419 Anxiety disorder, unspecified: Secondary | ICD-10-CM | POA: Diagnosis not present

## 2022-08-13 DIAGNOSIS — F32A Depression, unspecified: Secondary | ICD-10-CM | POA: Diagnosis not present

## 2022-08-18 DIAGNOSIS — F32A Depression, unspecified: Secondary | ICD-10-CM | POA: Diagnosis not present

## 2022-08-18 DIAGNOSIS — E785 Hyperlipidemia, unspecified: Secondary | ICD-10-CM | POA: Diagnosis not present

## 2022-08-18 DIAGNOSIS — M1990 Unspecified osteoarthritis, unspecified site: Secondary | ICD-10-CM | POA: Diagnosis not present

## 2022-08-18 DIAGNOSIS — F411 Generalized anxiety disorder: Secondary | ICD-10-CM | POA: Diagnosis not present

## 2022-08-18 DIAGNOSIS — S52531D Colles' fracture of right radius, subsequent encounter for closed fracture with routine healing: Secondary | ICD-10-CM | POA: Diagnosis not present

## 2022-08-18 DIAGNOSIS — M48062 Spinal stenosis, lumbar region with neurogenic claudication: Secondary | ICD-10-CM | POA: Diagnosis not present

## 2022-08-18 DIAGNOSIS — I10 Essential (primary) hypertension: Secondary | ICD-10-CM | POA: Diagnosis not present

## 2022-08-18 DIAGNOSIS — E039 Hypothyroidism, unspecified: Secondary | ICD-10-CM | POA: Diagnosis not present

## 2022-08-18 DIAGNOSIS — G9009 Other idiopathic peripheral autonomic neuropathy: Secondary | ICD-10-CM | POA: Diagnosis not present

## 2022-08-23 ENCOUNTER — Ambulatory Visit: Payer: Medicare PPO | Admitting: Orthopedic Surgery

## 2022-08-24 DIAGNOSIS — S52531A Colles' fracture of right radius, initial encounter for closed fracture: Secondary | ICD-10-CM | POA: Diagnosis not present

## 2022-08-24 DIAGNOSIS — M25531 Pain in right wrist: Secondary | ICD-10-CM | POA: Diagnosis not present

## 2022-08-24 DIAGNOSIS — Z4789 Encounter for other orthopedic aftercare: Secondary | ICD-10-CM | POA: Diagnosis not present

## 2022-08-31 DIAGNOSIS — F331 Major depressive disorder, recurrent, moderate: Secondary | ICD-10-CM | POA: Diagnosis not present

## 2022-08-31 DIAGNOSIS — F411 Generalized anxiety disorder: Secondary | ICD-10-CM | POA: Diagnosis not present

## 2022-09-02 DIAGNOSIS — M25531 Pain in right wrist: Secondary | ICD-10-CM | POA: Diagnosis not present

## 2022-09-07 DIAGNOSIS — M25531 Pain in right wrist: Secondary | ICD-10-CM | POA: Diagnosis not present

## 2022-09-14 DIAGNOSIS — M25531 Pain in right wrist: Secondary | ICD-10-CM | POA: Diagnosis not present

## 2022-09-16 DIAGNOSIS — Z8781 Personal history of (healed) traumatic fracture: Secondary | ICD-10-CM | POA: Diagnosis not present

## 2022-09-16 DIAGNOSIS — F419 Anxiety disorder, unspecified: Secondary | ICD-10-CM | POA: Diagnosis not present

## 2022-09-16 DIAGNOSIS — R609 Edema, unspecified: Secondary | ICD-10-CM | POA: Diagnosis not present

## 2022-09-16 DIAGNOSIS — F3341 Major depressive disorder, recurrent, in partial remission: Secondary | ICD-10-CM | POA: Diagnosis not present

## 2022-09-16 DIAGNOSIS — E039 Hypothyroidism, unspecified: Secondary | ICD-10-CM | POA: Diagnosis not present

## 2022-09-16 DIAGNOSIS — I10 Essential (primary) hypertension: Secondary | ICD-10-CM | POA: Diagnosis not present

## 2022-09-16 DIAGNOSIS — E785 Hyperlipidemia, unspecified: Secondary | ICD-10-CM | POA: Diagnosis not present

## 2022-09-21 DIAGNOSIS — Z4789 Encounter for other orthopedic aftercare: Secondary | ICD-10-CM | POA: Diagnosis not present

## 2022-09-21 DIAGNOSIS — M25531 Pain in right wrist: Secondary | ICD-10-CM | POA: Diagnosis not present

## 2022-09-21 DIAGNOSIS — S52531A Colles' fracture of right radius, initial encounter for closed fracture: Secondary | ICD-10-CM | POA: Diagnosis not present

## 2022-09-21 DIAGNOSIS — S52531D Colles' fracture of right radius, subsequent encounter for closed fracture with routine healing: Secondary | ICD-10-CM | POA: Diagnosis not present

## 2022-09-28 DIAGNOSIS — M25531 Pain in right wrist: Secondary | ICD-10-CM | POA: Diagnosis not present

## 2022-09-30 DIAGNOSIS — F331 Major depressive disorder, recurrent, moderate: Secondary | ICD-10-CM | POA: Diagnosis not present

## 2022-09-30 DIAGNOSIS — F411 Generalized anxiety disorder: Secondary | ICD-10-CM | POA: Diagnosis not present

## 2022-10-04 ENCOUNTER — Ambulatory Visit: Payer: Medicare PPO | Admitting: Orthopedic Surgery

## 2022-10-04 DIAGNOSIS — M25561 Pain in right knee: Secondary | ICD-10-CM | POA: Diagnosis not present

## 2022-10-05 ENCOUNTER — Encounter: Payer: Self-pay | Admitting: Orthopedic Surgery

## 2022-10-05 NOTE — Progress Notes (Unsigned)
Office Visit Note   [name]           Date of Birth: 03/10/1946           MRN: [id number] Visit Date: 10/04/2022 Requested by: [name], MD [address],  [place] [id number] PCP: [name], MD  Subjective: Chief Complaint  Patient presents with   Right Knee - Follow-up    HPI: [name] is a 76 y.o. female who presents to the office reporting right knee recheck.  Patient has been going to physical therapy during the month of April.  Overall she has gotten good relief from this strengthening regimen.  This has helped a lot with the pain.  She does still ambulate with a cane..                ROS: All systems reviewed are negative as they relate to the chief complaint within the history of present illness.  Patient denies fevers or chills.  Assessment & Plan: Visit Diagnoses:  1. Right knee pain, unspecified chronicity     Plan: Impression is right knee partial extensor mechanism disruption following knee replacement with improvement following physical therapy and functional knee extension at this time.  Plan is 4 months return for final check.  She does have good extensor strength with about only a 5 to 10 degree extensor lag at this time. Follow-Up Instructions: No follow-ups on file.   Orders:  No orders of the defined types were placed in this encounter.  No orders of the defined types were placed in this encounter.     Procedures: No procedures performed   Clinical Data: No additional findings.  Objective: Vital Signs: There were no vitals taken for this visit.  Physical Exam:  Constitutional: Patient appears well-developed HEENT:  Head: Normocephalic Eyes:EOM are normal Neck: Normal range of motion Cardiovascular: Normal rate Pulmonary/chest: Effort normal Neurologic: Patient is alert Skin: Skin is warm Psychiatric: Patient has normal mood and affect  Ortho Exam: Ortho exam demonstrates normal gait and  alignment.  Range of motion is on the right-hand side 0 to about 110.  Collaterals are stable to varus valgus stress.  Extensor mechanism intact.  Has about a 5 to 10 degree extensor lag but 5 out of 5 leg extension strength right versus left.  Specialty Comments:  No specialty comments available.  Imaging: No results found.   PMFS History: Patient Active Problem List   Diagnosis Date Noted   E coli bacteremia    Acute lower UTI    Sepsis due to undetermined organism ([place]) 09/19/2021   Hypokalemia 09/19/2021   Leukopenia 09/19/2021   Prolonged QT interval 09/19/2021   Arthritis of right knee    UTI (urinary tract infection) 06/26/2021   Severe sepsis ([place]) 69/48/5462   Acute metabolic encephalopathy 70/35/0093   Hypothyroid 06/25/2021   S/P total knee arthroplasty, right 06/09/2021   Fever    Staphylococcus aureus bacteremia with sepsis ([place])    SIRS (systemic inflammatory response syndrome) ([place]) 11/30/2020   Lumbar radiculopathy 10/10/2019   Bilateral stenosis of lateral recess of lumbar spine 10/10/2019   Hemorrhoids, external without complications 81/82/9937   Depression    Anxiety    Rectal bleeding    History of radiation therapy    Malignant neoplasm of anal canal ([place]) 11/15/2011   Hemorrhoids, internal, with bleeding 09/07/2011   Acquired anal stenosis 09/07/2011   Hyperlipidemia 09/07/2011   Family history of breast cancer in  sister 09/07/2011   Past Medical History:  Diagnosis Date   anal ca dx'd 11/2011   squamous cell carcinoma   Anxiety    Arthritis 2022   Depression    Full dentures    Hemorrhoids    external   History of radiation therapy 11/29/11 to 01/05/12   anal canal 5040 cGy 28 sessions, regional lymph nodes 4200 cGy 28 sessions   Hyperlipidemia    Lumbar radiculopathy 10/10/2019   Rectal bleeding    Staphylococcus aureus bacteremia with sepsis ([place])    UTI (urinary tract infection) 06/26/2021   Vitamin D deficiency    Wears glasses      Family History  Problem Relation Age of Onset   Cancer Sister        breast/ age 65   Cervical cancer Sister        100   Alcohol abuse Maternal Uncle     Past Surgical History:  Procedure Laterality Date   ABDOMINAL HYSTERECTOMY  1978   age 51   BUBBLE STUDY  12/04/2020   Procedure: BUBBLE STUDY;  Surgeon: [name], MD;  Location: [facility] ENDOSCOPY;  Service: Cardiology;;   EXAMINATION UNDER ANESTHESIA  06/21/2012   Procedure: EXAM UNDER ANESTHESIA;  Surgeon: [name], MD;  Location: [facility];  Service: General;  Laterality: N/A;  rectal exam under anesthesia, anal dilation, rectal biopsy   [name]   Patient had to guess on the date.   RECTAL BIOPSY  06/21/2012   Procedure: BIOPSY RECTAL;  Surgeon: [name], MD;  Location: [facility];  Service: General;  Laterality: N/A;   TEE WITHOUT CARDIOVERSION N/A 12/04/2020   Procedure: TRANSESOPHAGEAL ECHOCARDIOGRAM (TEE);  Surgeon: [name], MD;  Location: [facility];  Service: Cardiology;  Laterality: N/A;   TOTAL KNEE ARTHROPLASTY Right 06/09/2021   Procedure: RIGHT TOTAL KNEE ARTHROPLASTY;  Surgeon: Meredith Pel, MD;  Location: [facility];  Service: Orthopedics;  Laterality: Right;   TUBAL LIGATION     b/l  age 74   Social History   Occupational History   Not on file  Tobacco Use   Smoking status: Former    Packs/day: 0.50    Years: 25.00    Total pack years: 12.50    Types: Cigarettes    Quit date: 09/07/1987    Years since quitting: 35.1   Smokeless tobacco: Never  Vaping Use   Vaping Use: Never used  Substance and Sexual Activity   Alcohol use: No   Drug use: No   Sexual activity: Not Currently

## 2022-10-10 ENCOUNTER — Emergency Department (HOSPITAL_COMMUNITY): Payer: Medicare PPO

## 2022-10-10 ENCOUNTER — Emergency Department (HOSPITAL_COMMUNITY)
Admission: EM | Admit: 2022-10-10 | Discharge: 2022-10-10 | Disposition: A | Discharge status: Home / Self Care | Payer: Medicare PPO | Attending: Emergency Medicine | Admitting: Emergency Medicine

## 2022-10-10 ENCOUNTER — Other Ambulatory Visit: Payer: Self-pay

## 2022-10-10 DIAGNOSIS — S29001A Unspecified injury of muscle and tendon of front wall of thorax, initial encounter: Secondary | ICD-10-CM | POA: Insufficient documentation

## 2022-10-10 DIAGNOSIS — Y9301 Activity, walking, marching and hiking: Secondary | ICD-10-CM | POA: Insufficient documentation

## 2022-10-10 DIAGNOSIS — S3993XA Unspecified injury of pelvis, initial encounter: Secondary | ICD-10-CM | POA: Insufficient documentation

## 2022-10-10 DIAGNOSIS — S3992XA Unspecified injury of lower back, initial encounter: Secondary | ICD-10-CM | POA: Diagnosis present

## 2022-10-10 DIAGNOSIS — W010XXA Fall on same level from slipping, tripping and stumbling without subsequent striking against object, initial encounter: Secondary | ICD-10-CM | POA: Insufficient documentation

## 2022-10-10 DIAGNOSIS — R0781 Pleurodynia: Secondary | ICD-10-CM | POA: Diagnosis not present

## 2022-10-10 DIAGNOSIS — S79919A Unspecified injury of unspecified hip, initial encounter: Secondary | ICD-10-CM | POA: Diagnosis not present

## 2022-10-10 DIAGNOSIS — M8588 Other specified disorders of bone density and structure, other site: Secondary | ICD-10-CM | POA: Diagnosis not present

## 2022-10-10 DIAGNOSIS — M545 Low back pain, unspecified: Secondary | ICD-10-CM | POA: Diagnosis not present

## 2022-10-10 DIAGNOSIS — Y92009 Unspecified place in unspecified non-institutional (private) residence as the place of occurrence of the external cause: Secondary | ICD-10-CM | POA: Diagnosis not present

## 2022-10-10 DIAGNOSIS — Y998 Other external cause status: Secondary | ICD-10-CM | POA: Insufficient documentation

## 2022-10-10 DIAGNOSIS — W19XXXA Unspecified fall, initial encounter: Secondary | ICD-10-CM

## 2022-10-10 DIAGNOSIS — Z043 Encounter for examination and observation following other accident: Secondary | ICD-10-CM | POA: Diagnosis not present

## 2022-10-10 DIAGNOSIS — R918 Other nonspecific abnormal finding of lung field: Secondary | ICD-10-CM | POA: Diagnosis not present

## 2022-10-10 DIAGNOSIS — S2242XA Multiple fractures of ribs, left side, initial encounter for closed fracture: Secondary | ICD-10-CM | POA: Diagnosis not present

## 2022-10-10 LAB — CBC
HCT: 35.4 % — ABNORMAL LOW (ref 36.0–46.0)
Hemoglobin: 11.9 g/dL — ABNORMAL LOW (ref 12.0–15.0)
MCH: 32.3 pg (ref 26.0–34.0)
MCHC: 33.6 g/dL (ref 30.0–36.0)
MCV: 96.2 fL (ref 80.0–100.0)
Platelets: 213 10*3/uL (ref 150–400)
RBC: 3.68 MIL/uL — ABNORMAL LOW (ref 3.87–5.11)
RDW: 13.3 % (ref 11.5–15.5)
WBC: 5.4 10*3/uL (ref 4.0–10.5)
nRBC: 0 % (ref 0.0–0.2)

## 2022-10-10 LAB — BASIC METABOLIC PANEL
Anion gap: 14 (ref 5–15)
BUN: 10 mg/dL (ref 8–23)
CO2: 26 mmol/L (ref 22–32)
Calcium: 9.1 mg/dL (ref 8.9–10.3)
Chloride: 102 mmol/L (ref 98–111)
Creatinine, Ser: 0.85 mg/dL (ref 0.44–1.00)
GFR, Estimated: >60 mL/min (ref >60)
Glucose, Bld: 115 mg/dL — ABNORMAL HIGH (ref 70–99)
Potassium: 3.7 mmol/L (ref 3.5–5.1)
Sodium: 142 mmol/L (ref 135–145)

## 2022-10-10 MED ORDER — ACETAMINOPHEN 325 MG PO TABS
650.0000 mg | ORAL_TABLET | Freq: Once | ORAL | Status: AC
  Administered 2022-10-10: 650 mg via ORAL
  Filled 2022-10-10: qty 2

## 2022-10-10 NOTE — ED Provider Notes (Signed)
  [facility] Emergency Department Provider Note MRN:  [id number]  Arrival date & time: 10/10/22     Chief Complaint   Fall   History of Present Illness   [name] is a 76 y.o. year-old female presents to the ED with chief complaint of low back pain and rib pain.  She states that she tripped and fell while walking about 6 days ago.  She states that she has pain in her left ribs, the center of her back, her low back, and in her pelvis.  She has been able to walk, but states that it hurts.  Her symptoms are worsened with hip flexion.  She denies being anticoagulated.  History provided by patient.   Review of Systems  Pertinent positive and negative review of systems noted in HPI.    Physical Exam   Vitals:   10/10/22 0303  BP: (!) 151/81  Pulse: (!) 107  Resp: (!) 24  Temp: 97.8 F (36.6 C)  SpO2: 99%    CONSTITUTIONAL:  frail-appearing, NAD NEURO:  Alert and oriented x 3, CN 3-12 grossly intact EYES:  eyes equal and reactive ENT/NECK:  Supple, no stridor  CARDIO:  tachycardic, regular rhythm, appears well-perfused  PULM:  No respiratory distress, CTAB GI/GU:  non-distended,  MSK/SPINE:  bilateral and central lumbar spine tenderness without step off or deformity, ambulatory with cane and antalgic gait. SKIN:  no rash, atraumatic   *Additional and/or pertinent findings included in MDM below  Diagnostic and Interventional Summary    EKG Interpretation  Date/Time:    Ventricular Rate:    PR Interval:    QRS Duration:   QT Interval:    QTC Calculation:   R Axis:     Text Interpretation:         Labs Reviewed  CBC  BASIC METABOLIC PANEL    DG Ribs Unilateral W/Chest Left    (Results Pending)  CT PELVIS WO CONTRAST    (Results Pending)  CT Lumbar Spine Wo Contrast    (Results Pending)  CT Chest Wo Contrast    (Results Pending)    Medications - No data to display   Procedures  /  Critical Care Procedures  ED Course and  Medical Decision Making  I have reviewed the triage vital signs, the nursing notes, and pertinent available records from the EMR.  Social Determinants Affecting Complexity of Care: Patient .   ED Course:    Medical Decision Making Amount and/or Complexity of Data Reviewed Labs: ordered. Radiology: ordered.     Consultants:    Treatment and Plan: Patient signed out to oncoming team    Final Clinical Impressions(s) / ED Diagnoses  No diagnosis found.  ED Discharge Orders     None         Discharge Instructions Discussed with and Provided to Patient:   Discharge Instructions   None      Montine Circle, PA-C 10/10/22 9021    Lajean Saver, MD 10/11/22 1620

## 2022-10-10 NOTE — Discharge Instructions (Addendum)
It was a pleasure take care of you today!  Your CT scans did not show any concerning findings today in the ED.  Ensure to take 10 deep breaths every hour throughout the day.  You may take over-the-counter 500 mg Tylenol every 6 hours as needed for your pain.  You may place ice or heat to the affected area for up to 15 minutes at a time, sure to place a barrier between your skin and the ice/heat.  Follow-up with your primary care provider this week regarding today's ED visit.  Return to the ED if you are experiencing increasing/worsening symptoms.

## 2022-10-10 NOTE — ED Notes (Signed)
IS requested from RT.

## 2022-10-10 NOTE — ED Provider Notes (Cosign Needed Addendum)
Care transferred from [place], PA-C at time of sign out. See their note for full assessment.   Briefly: Patient is 76 y.o. female who presents to the ED with concerns for mechanical fall onset 6 days ago.     Plan: Plan per previous PA-C: pending CT scans and dc, pt has been ambulatory in the ED.  Labs Reviewed  CBC - Abnormal; Notable for the following components:      Result Value   RBC 3.68 (*)    Hemoglobin 11.9 (*)    HCT 35.4 (*)    All other components within normal limits  BASIC METABOLIC PANEL - Abnormal; Notable for the following components:   Glucose, Bld 115 (*)    All other components within normal limits   Results for orders placed or performed during the hospital encounter of 10/10/22  CBC  Result Value Ref Range   WBC 5.4 4.0 - 10.5 K/uL   RBC 3.68 (L) 3.87 - 5.11 MIL/uL   Hemoglobin 11.9 (L) 12.0 - 15.0 g/dL   HCT 35.4 (L) 36.0 - 46.0 %   MCV 96.2 80.0 - 100.0 fL   MCH 32.3 26.0 - 34.0 pg   MCHC 33.6 30.0 - 36.0 g/dL   RDW 13.3 11.5 - 15.5 %   Platelets 213 150 - 400 K/uL   nRBC 0.0 0.0 - 0.2 %  Basic metabolic panel  Result Value Ref Range   Sodium 142 135 - 145 mmol/L   Potassium 3.7 3.5 - 5.1 mmol/L   Chloride 102 98 - 111 mmol/L   CO2 26 22 - 32 mmol/L   Glucose, Bld 115 (H) 70 - 99 mg/dL   BUN 10 8 - 23 mg/dL   Creatinine, Ser 0.85 0.44 - 1.00 mg/dL   Calcium 9.1 8.9 - 10.3 mg/dL   GFR, Estimated >60 >60 mL/min   Anion gap 14 5 - 15   CT Chest Wo Contrast  Result Date: 10/10/2022 CLINICAL DATA:  Recent fall with left lateral rib pain, worse with coughing. EXAM: CT CHEST WITHOUT CONTRAST TECHNIQUE: Multidetector CT imaging of the chest was performed following the standard protocol without IV contrast. RADIATION DOSE REDUCTION: This exam was performed according to the departmental dose-optimization program which includes automated exposure control, adjustment of the mA and/or kV according to patient size and/or use of iterative reconstruction  technique. COMPARISON:  11/30/2020 FINDINGS: Cardiovascular: The heart size is normal. No substantial pericardial effusion. Coronary artery calcification is evident. Mild atherosclerotic calcification is noted in the wall of the thoracic aorta. Mediastinum/Nodes: No mediastinal lymphadenopathy. No evidence for gross hilar lymphadenopathy although assessment is limited by the lack of intravenous contrast on the current study. The esophagus has normal imaging features. Tiny hiatal hernia evident. There is no axillary lymphadenopathy. Lungs/Pleura: No pneumothorax or pleural effusion. No suspicious pulmonary nodule or mass. Atelectasis or scarring noted inferior right lower lobe adjacent to the dome of the hemidiaphragm. Upper Abdomen: Unremarkable. Musculoskeletal: Subtle deformity of the posterior left fifth, sixth, and eighth ribs is compatible with remote, healed fracture. No evidence for an acute rib fracture on the current study no evidence for an acute fracture of the thoracic spine. IMPRESSION: 1. No acute findings in the chest. Specifically, no evidence for acute rib fracture. 2. Subtle deformity of the posterior left fifth, sixth, and eighth ribs is compatible with remote, healed fracture. 3. Tiny hiatal hernia. 4.  Aortic Atherosclerosis (ICD10-I70.0). Electronically Signed   By: Misty Stanley M.D.   On: 10/10/2022 07:11  CT PELVIS WO CONTRAST  Result Date: 10/10/2022 CLINICAL DATA:  Hip trauma with fracture suspected EXAM: CT PELVIS WITHOUT CONTRAST TECHNIQUE: Multidetector CT imaging of the pelvis was performed following the standard protocol without intravenous contrast. RADIATION DOSE REDUCTION: This exam was performed according to the departmental dose-optimization program which includes automated exposure control, adjustment of the mA and/or kV according to patient size and/or use of iterative reconstruction technique. COMPARISON:  Abdominal CT 11/30/2020 FINDINGS: Negative for hip fracture or  dislocation. Negative for pelvic ring fracture or diastasis. Generalized osteopenia. No acute soft tissue finding. Negative for hip joint effusion. The covered pelvic viscera are unremarkable. IMPRESSION: No acute finding. Electronically Signed   By: Jorje Guild M.D.   On: 10/10/2022 07:04   CT Lumbar Spine Wo Contrast  Result Date: 10/10/2022 CLINICAL DATA:  Back trauma EXAM: CT LUMBAR SPINE WITHOUT CONTRAST TECHNIQUE: Multidetector CT imaging of the lumbar spine was performed without intravenous contrast administration. Multiplanar CT image reconstructions were also generated. RADIATION DOSE REDUCTION: This exam was performed according to the departmental dose-optimization program which includes automated exposure control, adjustment of the mA and/or kV according to patient size and/or use of iterative reconstruction technique. COMPARISON:  10/12/2021 FINDINGS: Segmentation: 5 lumbar type vertebrae Alignment: Degenerative anterolisthesis at L5-S1. Vertebrae: Chronic compression fractures with unchanged height loss at L1, L3, and L5. Inferior endplate fracture has occurred at T12 when compared to 2022, but chronic appearing. Generalized osteopenia. No evidence of aggressive bone lesion. Paraspinal and other soft tissues: No visible perispinal hematoma. Disc levels: Generalized disc space narrowing and ridging. Degenerative facet spurring diffusely with L4-5 ankylosis. Notable spinal and biforaminal stenosis at L3-4. IMPRESSION: 1. No acute finding. 2. Osteopenia and multiple chronic compression fractures. 3. Generalized lumbar spine degeneration with L4-5 anterolisthesis. Advanced spinal and bilateral foraminal stenosis at L3-4. Electronically Signed   By: Jorje Guild M.D.   On: 10/10/2022 07:02   DG Ribs Unilateral W/Chest Left  Result Date: 10/10/2022 CLINICAL DATA:  Fall getting out of bed. EXAM: LEFT RIBS AND CHEST - 3+ VIEW COMPARISON:  Chest x-ray 09/19/2021 FINDINGS: Asymmetric elevation  right hemidiaphragm is progressive in the interval. No evidence for pneumothorax or pleural effusion. Interstitial markings are diffusely coarsened with chronic features. Small focal opacity in the lower left lung is probably atelectasis although pneumonia is not excluded. Oblique views of the left ribs show no evidence for an acute displaced left-sided rib fracture although assessment is limited by diffuse osteopenia. Healed fracture of the posterior left sixth rib evident. IMPRESSION: 1. No evidence for acute displaced left-sided rib fracture although assessment is limited by diffuse osteopenia. 2. New small focal opacity in the lower left lung is probably atelectasis although pneumonia is not excluded. Pulmonary neoplasm could also have this appearance. Dedicated CT chest without contrast recommended to further evaluate. 3. Progressive asymmetric elevation right hemidiaphragm. Electronically Signed   By: Misty Stanley M.D.   On: 10/10/2022 05:22    Clinical Course as of 10/10/22 1856  Sun Oct 10, 2022  0713 Discussed with patient CT findings.  Patient ambulatory to bathroom with cane without assistance.  Discussed with patient discharge treatment plan.  Patient agreeable at this time.  Patient appears safe for discharge. [SB]  0831 Patient notes that she would like to leave at this time.  Instructed patient to take 10 deep breaths every hour and lieu of the incentive spirometer. Patient agreeable.  Patient appears safe for discharge. [SB]    Clinical Course User Index [SB] Bailie Christenbury,  Dorethea Strubel A, PA-C     Doubt fracture, dislocation, PTX, or mass.  Patient provided with incentive spirometer at discharge. Pt provided with tylenol prior to discharge. Pt ambulatory with cane at discharge. Supportive care and strict return precautions discussed with patient. Patient acknowledges and verbalizes understanding. Pt appears safe for discharge. Follow up as indicated in discharge paperwork.    This chart was dictated  using voice recognition software, Dragon. Despite the best efforts of this provider to proofread and correct errors, errors may still occur which can change documentation meaning.   Clydell Sposito A, PA-C 10/10/22 0727    Nehemiah Settle, PA-C 10/10/22 [id number]    Valarie Merino, MD 10/11/22 [phone]

## 2022-10-10 NOTE — ED Notes (Signed)
Patient ambulating with cane. Waiting on IS for discharge.

## 2022-10-10 NOTE — ED Triage Notes (Signed)
The pt fell in her bedroom on Monday night she tripped and fell  since then she has had lt lateral ribs in her upper back   the pain is worse with coughing.  She also has pain in her lower back

## 2022-10-14 DIAGNOSIS — M25551 Pain in right hip: Secondary | ICD-10-CM | POA: Diagnosis not present

## 2022-10-14 DIAGNOSIS — M549 Dorsalgia, unspecified: Secondary | ICD-10-CM | POA: Diagnosis not present

## 2022-10-14 DIAGNOSIS — M25552 Pain in left hip: Secondary | ICD-10-CM | POA: Diagnosis not present

## 2022-10-14 DIAGNOSIS — W19XXXS Unspecified fall, sequela: Secondary | ICD-10-CM | POA: Diagnosis not present

## 2022-10-18 DIAGNOSIS — E785 Hyperlipidemia, unspecified: Secondary | ICD-10-CM | POA: Diagnosis not present

## 2022-10-18 DIAGNOSIS — E039 Hypothyroidism, unspecified: Secondary | ICD-10-CM | POA: Diagnosis not present

## 2022-10-18 DIAGNOSIS — F3341 Major depressive disorder, recurrent, in partial remission: Secondary | ICD-10-CM | POA: Diagnosis not present

## 2022-10-18 DIAGNOSIS — I10 Essential (primary) hypertension: Secondary | ICD-10-CM | POA: Diagnosis not present

## 2022-10-19 DIAGNOSIS — Z4789 Encounter for other orthopedic aftercare: Secondary | ICD-10-CM | POA: Diagnosis not present

## 2022-10-19 DIAGNOSIS — S52531A Colles' fracture of right radius, initial encounter for closed fracture: Secondary | ICD-10-CM | POA: Diagnosis not present

## 2022-10-21 DIAGNOSIS — I10 Essential (primary) hypertension: Secondary | ICD-10-CM | POA: Diagnosis not present

## 2022-10-21 DIAGNOSIS — M810 Age-related osteoporosis without current pathological fracture: Secondary | ICD-10-CM | POA: Diagnosis not present

## 2022-10-21 DIAGNOSIS — M5136 Other intervertebral disc degeneration, lumbar region: Secondary | ICD-10-CM | POA: Diagnosis not present

## 2022-11-05 DIAGNOSIS — M5136 Other intervertebral disc degeneration, lumbar region: Secondary | ICD-10-CM | POA: Diagnosis not present

## 2022-11-05 DIAGNOSIS — I10 Essential (primary) hypertension: Secondary | ICD-10-CM | POA: Diagnosis not present

## 2022-11-05 DIAGNOSIS — M549 Dorsalgia, unspecified: Secondary | ICD-10-CM | POA: Diagnosis not present

## 2022-11-10 DIAGNOSIS — F411 Generalized anxiety disorder: Secondary | ICD-10-CM | POA: Diagnosis not present

## 2022-11-10 DIAGNOSIS — F331 Major depressive disorder, recurrent, moderate: Secondary | ICD-10-CM | POA: Diagnosis not present

## 2022-11-15 DIAGNOSIS — M5451 Vertebrogenic low back pain: Secondary | ICD-10-CM | POA: Diagnosis not present

## 2022-11-15 DIAGNOSIS — M546 Pain in thoracic spine: Secondary | ICD-10-CM | POA: Diagnosis not present

## 2022-11-25 DIAGNOSIS — E039 Hypothyroidism, unspecified: Secondary | ICD-10-CM | POA: Diagnosis not present

## 2022-11-25 DIAGNOSIS — M1990 Unspecified osteoarthritis, unspecified site: Secondary | ICD-10-CM | POA: Diagnosis not present

## 2022-11-25 DIAGNOSIS — K5900 Constipation, unspecified: Secondary | ICD-10-CM | POA: Diagnosis not present

## 2022-11-25 DIAGNOSIS — K219 Gastro-esophageal reflux disease without esophagitis: Secondary | ICD-10-CM | POA: Diagnosis not present

## 2022-11-25 DIAGNOSIS — I499 Cardiac arrhythmia, unspecified: Secondary | ICD-10-CM | POA: Diagnosis not present

## 2022-11-25 DIAGNOSIS — E876 Hypokalemia: Secondary | ICD-10-CM | POA: Diagnosis not present

## 2022-11-25 DIAGNOSIS — M810 Age-related osteoporosis without current pathological fracture: Secondary | ICD-10-CM | POA: Diagnosis not present

## 2022-11-25 DIAGNOSIS — F329 Major depressive disorder, single episode, unspecified: Secondary | ICD-10-CM | POA: Diagnosis not present

## 2022-11-25 DIAGNOSIS — K08109 Complete loss of teeth, unspecified cause, unspecified class: Secondary | ICD-10-CM | POA: Diagnosis not present

## 2022-11-25 DIAGNOSIS — G4700 Insomnia, unspecified: Secondary | ICD-10-CM | POA: Diagnosis not present

## 2022-11-25 DIAGNOSIS — E785 Hyperlipidemia, unspecified: Secondary | ICD-10-CM | POA: Diagnosis not present

## 2022-11-25 DIAGNOSIS — M5430 Sciatica, unspecified side: Secondary | ICD-10-CM | POA: Diagnosis not present

## 2022-11-25 DIAGNOSIS — G250 Essential tremor: Secondary | ICD-10-CM | POA: Diagnosis not present

## 2022-11-25 DIAGNOSIS — R2681 Unsteadiness on feet: Secondary | ICD-10-CM | POA: Diagnosis not present

## 2022-11-25 DIAGNOSIS — G629 Polyneuropathy, unspecified: Secondary | ICD-10-CM | POA: Diagnosis not present

## 2022-11-25 DIAGNOSIS — M4800 Spinal stenosis, site unspecified: Secondary | ICD-10-CM | POA: Diagnosis not present

## 2022-11-25 DIAGNOSIS — I10 Essential (primary) hypertension: Secondary | ICD-10-CM | POA: Diagnosis not present

## 2022-11-25 DIAGNOSIS — R32 Unspecified urinary incontinence: Secondary | ICD-10-CM | POA: Diagnosis not present

## 2022-11-26 ENCOUNTER — Encounter: Payer: Self-pay | Admitting: *Deleted

## 2022-11-26 ENCOUNTER — Inpatient Hospital Stay: Payer: Medicare PPO | Attending: Oncology | Admitting: Oncology

## 2022-11-26 NOTE — Progress Notes (Signed)
[name] was "no show" for f/u today. Scheduling message sent to reschedule for ~ 1 month.

## 2022-11-29 DIAGNOSIS — M5451 Vertebrogenic low back pain: Secondary | ICD-10-CM | POA: Diagnosis not present

## 2022-12-04 ENCOUNTER — Encounter (HOSPITAL_COMMUNITY): Payer: Self-pay

## 2022-12-04 ENCOUNTER — Other Ambulatory Visit: Payer: Self-pay

## 2022-12-04 ENCOUNTER — Emergency Department (HOSPITAL_COMMUNITY): Payer: Medicare PPO

## 2022-12-04 ENCOUNTER — Emergency Department (HOSPITAL_COMMUNITY)
Admission: EM | Admit: 2022-12-04 | Discharge: 2022-12-04 | Disposition: A | Discharge status: Home / Self Care | Payer: Medicare PPO | Attending: Emergency Medicine | Admitting: Emergency Medicine

## 2022-12-04 DIAGNOSIS — R5383 Other fatigue: Secondary | ICD-10-CM | POA: Diagnosis not present

## 2022-12-04 DIAGNOSIS — R9431 Abnormal electrocardiogram [ECG] [EKG]: Secondary | ICD-10-CM | POA: Diagnosis not present

## 2022-12-04 DIAGNOSIS — Z1152 Encounter for screening for COVID-19: Secondary | ICD-10-CM | POA: Diagnosis not present

## 2022-12-04 DIAGNOSIS — R509 Fever, unspecified: Secondary | ICD-10-CM | POA: Insufficient documentation

## 2022-12-04 DIAGNOSIS — Z85048 Personal history of other malignant neoplasm of rectum, rectosigmoid junction, and anus: Secondary | ICD-10-CM | POA: Insufficient documentation

## 2022-12-04 DIAGNOSIS — J9811 Atelectasis: Secondary | ICD-10-CM | POA: Diagnosis not present

## 2022-12-04 DIAGNOSIS — R197 Diarrhea, unspecified: Secondary | ICD-10-CM | POA: Diagnosis not present

## 2022-12-04 DIAGNOSIS — I4581 Long QT syndrome: Secondary | ICD-10-CM | POA: Diagnosis not present

## 2022-12-04 DIAGNOSIS — R531 Weakness: Secondary | ICD-10-CM | POA: Diagnosis not present

## 2022-12-04 LAB — COMPREHENSIVE METABOLIC PANEL
ALT: 19 U/L (ref 0–44)
AST: 25 U/L (ref 15–41)
Albumin: 3.9 g/dL (ref 3.5–5.0)
Alkaline Phosphatase: 85 U/L (ref 38–126)
Anion gap: 10 (ref 5–15)
BUN: 8 mg/dL (ref 8–23)
CO2: 24 mmol/L (ref 22–32)
Calcium: 8.7 mg/dL — ABNORMAL LOW (ref 8.9–10.3)
Chloride: 105 mmol/L (ref 98–111)
Creatinine, Ser: 0.54 mg/dL (ref 0.44–1.00)
GFR, Estimated: >60 mL/min (ref >60)
Glucose, Bld: 116 mg/dL — ABNORMAL HIGH (ref 70–99)
Potassium: 3.5 mmol/L (ref 3.5–5.1)
Sodium: 139 mmol/L (ref 135–145)
Total Bilirubin: 0.6 mg/dL (ref 0.3–1.2)
Total Protein: 6.6 g/dL (ref 6.5–8.1)

## 2022-12-04 LAB — CBC WITH DIFFERENTIAL/PLATELET
Abs Immature Granulocytes: 0.02 10*3/uL (ref 0.00–0.07)
Basophils Absolute: 0 10*3/uL (ref 0.0–0.1)
Basophils Relative: 1 %
Eosinophils Absolute: 0.1 10*3/uL (ref 0.0–0.5)
Eosinophils Relative: 2 %
HCT: 36.7 % (ref 36.0–46.0)
Hemoglobin: 12.2 g/dL (ref 12.0–15.0)
Immature Granulocytes: 0 %
Lymphocytes Relative: 16 %
Lymphs Abs: 0.8 10*3/uL (ref 0.7–4.0)
MCH: 31.5 pg (ref 26.0–34.0)
MCHC: 33.2 g/dL (ref 30.0–36.0)
MCV: 94.8 fL (ref 80.0–100.0)
Monocytes Absolute: 0.3 10*3/uL (ref 0.1–1.0)
Monocytes Relative: 5 %
Neutro Abs: 4 10*3/uL (ref 1.7–7.7)
Neutrophils Relative %: 76 %
Platelets: 218 10*3/uL (ref 150–400)
RBC: 3.87 MIL/uL (ref 3.87–5.11)
RDW: 13.4 % (ref 11.5–15.5)
WBC: 5.3 10*3/uL (ref 4.0–10.5)
nRBC: 0 % (ref 0.0–0.2)

## 2022-12-04 LAB — RESP PANEL BY RT-PCR (RSV, FLU A&B, COVID)  RVPGX2
Influenza A by PCR: NEGATIVE
Influenza B by PCR: NEGATIVE
Resp Syncytial Virus by PCR: NEGATIVE
SARS Coronavirus 2 by RT PCR: NEGATIVE

## 2022-12-04 NOTE — Discharge Instructions (Signed)
Lab work was all reassuring, like you to consume a bland diet as this will help relieve diarrhea, please remember to stay hydrated, may supplement with Gatorade or Pedialyte.  Like to follow-up with your primary care doctor for reassessment  On your EKG shows that you have a slightly prolonged QT most time this is benign, would like you to follow-up with them for further assessment.  Come back to the emergency department if you develop chest pain, shortness of breath, severe abdominal pain, uncontrolled nausea, vomiting, diarrhea.

## 2022-12-04 NOTE — ED Triage Notes (Signed)
Pt BIB [facility] from home w c/o diarrhea and feeling hot. Pt was a little confused with EMS. Pt lives alone.   EMS VS BP 144/96 P 82 R 18 O2 99% RA CBG 115

## 2022-12-04 NOTE — ED Provider Notes (Signed)
[facility] Provider Note   CSN: [id number] Arrival date & time: 12/04/22  0036     History  Chief Complaint  Patient presents with   Diarrhea    [name] is a 77 y.o. female.  HPI   Patient with medical history including depression, anxiety, anal cancer, UTI presents with complaints of diarrhea and feeling generalized weakness.  Patient states that started yesterday, states that she had approximately 3 episodes of diarrhea no blood denies any dark tarry stools, she states that she feels full body weakness, she had associated fevers and chills, but denies any cough congestion no stomach pains no nausea or vomiting she denies any urinary symptoms.  She denies any recent falls.  No sick contacts.  States that she has been eating drinking out difficulty, no new difficulty with ambulation while at home.   Home Medications Prior to Admission medications   Medication Sig Start Date End Date Taking? Authorizing Provider  ARIPiprazole (ABILIFY) 10 MG tablet Take 10 mg by mouth every morning. 09/07/22   [provider]  celecoxib (CELEBREX) 100 MG capsule Take 1 capsule (100 mg total) by mouth daily as needed for mild pain. Patient taking differently: Take 100 mg by mouth daily. 07/20/21   Magnant, Charles L, PA-C  DULoxetine (CYMBALTA) 60 MG capsule Take 120 mg by mouth daily.    [provider]  furosemide (LASIX) 40 MG tablet Take 40 mg by mouth daily.    [provider]  gabapentin (NEURONTIN) 600 MG tablet Take 600 mg by mouth 2 (two) times daily. 07/14/16   [provider]  levothyroxine (SYNTHROID, LEVOTHROID) 25 MCG tablet Take 25 mcg by mouth daily before breakfast. 08/29/18   [provider]  potassium chloride (KLOR-CON) 10 MEQ tablet Take 10 mEq by mouth 2 (two) times daily. 06/24/22   [provider]  pravastatin (PRAVACHOL) 40 MG tablet Take 40 mg by mouth daily.    [provider]  traZODone (DESYREL) 100 MG tablet Take 200 mg by mouth at bedtime.    [provider]      Allergies    Patient has no known allergies.    Review of Systems   Review of Systems  Constitutional:  Positive for chills, fatigue and fever.  Respiratory:  Negative for shortness of breath.   Cardiovascular:  Negative for chest pain.  Gastrointestinal:  Positive for diarrhea. Negative for abdominal pain.  Neurological:  Negative for headaches.    Physical Exam Updated Vital Signs BP (!) 140/68   Pulse 95   Temp (!) 97.5 F (36.4 C) (Oral)   Resp 19   Ht '5\' 5"'$  (1.651 m)   Wt 75.3 kg   SpO2 99%   BMI 27.62 kg/m  Physical Exam Vitals and nursing note reviewed.  Constitutional:      General: She is not in acute distress.    Appearance: She is not ill-appearing.  HENT:     Head: Normocephalic and atraumatic.     Nose: No congestion.     Mouth/Throat:     Mouth: Mucous membranes are moist.     Pharynx: Oropharynx is clear.  Eyes:     Conjunctiva/sclera: Conjunctivae normal.  Cardiovascular:     Rate and Rhythm: Normal rate and regular rhythm.     Pulses: Normal pulses.     Heart sounds: No murmur heard.    No friction rub. No gallop.  Pulmonary:  Effort: No respiratory distress.     Breath sounds: No wheezing, rhonchi or rales.  Abdominal:     Palpations: Abdomen is soft.     Tenderness: There is no abdominal tenderness. There is no right CVA tenderness or left CVA tenderness.  Skin:    General: Skin is warm and dry.  Neurological:     Mental Status: She is alert.     GCS: GCS eye subscore is 4. GCS verbal subscore is 5. GCS motor subscore is 6.     Cranial Nerves: Cranial nerves 2-12 are intact.     Motor: No weakness.     Coordination: Romberg sign negative. Finger-Nose-Finger Test normal.     Comments: Cranial nerves II through XII grossly intact no difficulty with word finding following two-step commands there is no unilateral weakness  present during my examination.  Psychiatric:        Mood and Affect: Mood normal.     ED Results / Procedures / Treatments   Labs (all labs ordered are listed, but only abnormal results are displayed) Labs Reviewed  COMPREHENSIVE METABOLIC PANEL - Abnormal; Notable for the following components:      Result Value   Glucose, Bld 116 (*)    Calcium 8.7 (*)    All other components within normal limits  RESP PANEL BY RT-PCR (RSV, FLU A&B, COVID)  [id number]  CBC WITH DIFFERENTIAL/PLATELET    EKG EKG Interpretation  Date/Time:  Saturday December 04 2022 01:32:26 EST Ventricular Rate:  77 PR Interval:  148 QRS Duration: 73 QT Interval:  531 QTC Calculation: 602 R Axis:   -39 Text Interpretation: Sinus rhythm Left axis deviation Low voltage, precordial leads Prolonged QT interval Confirmed by Addison Lank [phone]) on 12/04/2022 1:51:19 AM  Radiology DG Chest Portable 1 View  Result Date: 12/04/2022 CLINICAL DATA:  Weakness. EXAM: PORTABLE CHEST 1 VIEW COMPARISON:  10/10/2022. FINDINGS: The heart size and mediastinal contours are within normal limits. There is atherosclerotic calcification of the aorta. Chronic elevation of the right diaphragm is noted. There is mild subsegmental atelectasis in the mid left lung. No effusion or pneumothorax. No acute osseous abnormality. IMPRESSION: Minimal subsegmental atelectasis in the mid left lung. Electronically Signed   By: Brett Fairy M.D.   On: 12/04/2022 02:02    Procedures Procedures    Medications Ordered in ED Medications - No data to display  ED Course/ Medical Decision Making/ A&P                             Medical Decision Making Amount and/or Complexity of Data Reviewed Labs: ordered. Radiology: ordered.   This patient presents to the ED for concern of diarrhea, weakness, this involves an extensive number of treatment options, and is a complaint that carries with it a high risk of complications and morbidity.  The  differential diagnosis includes electrolyte derailment, diverticulitis, sepsis, UTI    Additional history obtained:  Additional history obtained from N/A External records from outside source obtained and reviewed including GI notes, recent hospitalization   Co morbidities that complicate the patient evaluation  N/A  Social Determinants of Health:  Geriatric    Lab Tests:  I Ordered, and personally interpreted labs.  The pertinent results include: CBC is unremarkable, CMP shows glucose of 116 calcium 8.7, respiratory panel negative,   Imaging Studies ordered:  I ordered imaging studies including chest x-ray I independently visualized and interpreted imaging which showed negative acute  findings I agree with the radiologist interpretation   Cardiac Monitoring:  The patient was maintained on a cardiac monitor.  I personally viewed and interpreted the cardiac monitored which showed an underlying rhythm of: EKG shows prolonged QT   Medicines ordered and prescription drug management:  I ordered medication including N/A I have reviewed the patients home medicines and have made adjustments as needed  Critical Interventions:  N/A   Reevaluation:  Presents with weakness and diarrhea, she had benign physical exam, will obtain lab work imaging and reassess.  Lab work is unremarkable, repeated EKG shows improved QTc, patient is ambulating without difficulty, she is in agreement with discharge at this time.   Consultations Obtained:  N/a    Test Considered:  CT AP-defer specialist for intra-abdominal abnormality is low at this time she has a nonsurgical abdomen.    Rule out Suspicion for sepsis is low at this time patient is nontoxic-appearing vital signs reassuring she has no leukocytosis afebrile nontachycardic.  I doubt pneumonia or URI lung sounds are clear bilaterally, chest x-ray unremarkable, respiratory panel negative.  I doubt bowel obstruction/volvulus  abdomen is soft nontender, still passing gas having normal bowel movements.  I doubt biliary or gallbladder abnormality having no right upper quadrant tenderness, liver enzymes alk phos T. bili all all unremarkable.  Suspicion for C. difficile is also low at this time no recent antibiotic use, patient has no history of this, presentation is atypical.  Suspicion for UTI Pilo kidney stone is also low she is not endorsing any urinary symptoms, she has no flank tenderness no suprapubic pain    Dispostion and problem list  After consideration of the diagnostic results and the patients response to treatment, I feel that the patent would benefit from discharge.  Diarrhea-likely this is viral enteritis, will recommend a bland diet, stay hydrated, follow-up with PCP strict return precautions Prolonged QT-patient was made aware of this, will have her follow-up with her PCP for reassessment.            Final Clinical Impression(s) / ED Diagnoses Final diagnoses:  Diarrhea, unspecified type  Prolonged QT interval    Rx / DC Orders ED Discharge Orders     None         Marcello Fennel, PA-C 12/04/22 0536    Fatima Blank, MD 12/04/22 [phone]

## 2022-12-14 DIAGNOSIS — S52531A Colles' fracture of right radius, initial encounter for closed fracture: Secondary | ICD-10-CM | POA: Diagnosis not present

## 2022-12-20 DIAGNOSIS — M810 Age-related osteoporosis without current pathological fracture: Secondary | ICD-10-CM | POA: Diagnosis not present

## 2022-12-20 DIAGNOSIS — R609 Edema, unspecified: Secondary | ICD-10-CM | POA: Diagnosis not present

## 2022-12-20 DIAGNOSIS — E785 Hyperlipidemia, unspecified: Secondary | ICD-10-CM | POA: Diagnosis not present

## 2022-12-20 DIAGNOSIS — I10 Essential (primary) hypertension: Secondary | ICD-10-CM | POA: Diagnosis not present

## 2022-12-20 DIAGNOSIS — F3341 Major depressive disorder, recurrent, in partial remission: Secondary | ICD-10-CM | POA: Diagnosis not present

## 2022-12-20 DIAGNOSIS — M5136 Other intervertebral disc degeneration, lumbar region: Secondary | ICD-10-CM | POA: Diagnosis not present

## 2022-12-21 DIAGNOSIS — F331 Major depressive disorder, recurrent, moderate: Secondary | ICD-10-CM | POA: Diagnosis not present

## 2022-12-21 DIAGNOSIS — F411 Generalized anxiety disorder: Secondary | ICD-10-CM | POA: Diagnosis not present

## 2023-01-27 DIAGNOSIS — H6090 Unspecified otitis externa, unspecified ear: Secondary | ICD-10-CM | POA: Diagnosis not present

## 2023-01-27 DIAGNOSIS — H6122 Impacted cerumen, left ear: Secondary | ICD-10-CM | POA: Diagnosis not present

## 2023-02-01 DIAGNOSIS — S52531A Colles' fracture of right radius, initial encounter for closed fracture: Secondary | ICD-10-CM | POA: Diagnosis not present

## 2023-02-07 ENCOUNTER — Encounter: Payer: Self-pay | Admitting: Orthopedic Surgery

## 2023-02-07 ENCOUNTER — Ambulatory Visit: Payer: Medicare PPO | Admitting: Orthopedic Surgery

## 2023-02-07 DIAGNOSIS — M25561 Pain in right knee: Secondary | ICD-10-CM | POA: Diagnosis not present

## 2023-02-07 NOTE — Progress Notes (Signed)
Office Visit Note   [name]           Date of Birth: 10-03-1946           MRN: [id number] Visit Date: 02/07/2023 Requested by: Laurann MontanaWhite, Cynthia, MD [phone] [address] [place],  [place] [id number] PCP: Laurann MontanaWhite, Cynthia, MD  Subjective: Chief Complaint  Patient presents with   Right Leg - Follow-up    HPI: Tiffany MacadamiaSandra G Yeats is a 77 y.o. female who presents to the office reporting right knee pain.  Not doing too much better than she was last clinic visit.  Parker ambulate without a cane.  Pain does not wake her from sleep.  She states that the leg is "weak" I         ROS: All systems reviewed are negative as they relate to the chief complaint within the history of present illness.  Patient denies fevers or chills.  Assessment & Plan: Visit Diagnoses: No diagnosis found.  Plan: Impression is right knee pain with some extensor mechanism disruption following knee replacement but overall she is reach steady state and I do not think it is can get much better.  Has only a 15 degree extensor lag but is able to ambulate.  Continue with quad strengthening exercises and follow-up as needed.  No indication for intervention at this time.  Follow-Up Instructions: No follow-ups on file.   Orders:  No orders of the defined types were placed in this encounter.  No orders of the defined types were placed in this encounter.     Procedures: No procedures performed   Clinical Data: No additional findings.  Objective: Vital Signs: There were no vitals taken for this visit.  Physical Exam:  Constitutional: Patient appears well-developed HEENT:  Head: Normocephalic Eyes:EOM are normal Neck: Normal range of motion Cardiovascular: Normal rate Pulmonary/chest: Effort normal Neurologic: Patient is alert Skin: Skin is warm Psychiatric: Patient has normal mood and affect  Ortho Exam: Ortho exam demonstrates 15 degree extensor lag on the right.  Collaterals are stable to  varus leg stress at 0 and 30 degrees.  Range of motion otherwise intact with easy flexion past 90.  Specialty Comments:  No specialty comments available.  Imaging: No results found.   PMFS History: Patient Active Problem List   Diagnosis Date Noted   E coli bacteremia    Acute lower UTI    Sepsis due to undetermined organism 09/19/2021   Hypokalemia 09/19/2021   Leukopenia 09/19/2021   Prolonged QT interval 09/19/2021   Arthritis of right knee    UTI (urinary tract infection) 06/26/2021   Severe sepsis 06/25/2021   Acute metabolic encephalopathy 06/25/2021   Hypothyroid 06/25/2021   S/P total knee arthroplasty, right 06/09/2021   Fever    Staphylococcus aureus bacteremia with sepsis    SIRS (systemic inflammatory response syndrome) 11/30/2020   Lumbar radiculopathy 10/10/2019   Bilateral stenosis of lateral recess of lumbar spine 10/10/2019   Hemorrhoids, external without complications 06/17/2014   Depression    Anxiety    Rectal bleeding    History of radiation therapy    Malignant neoplasm of anal canal (HCC) 11/15/2011   Hemorrhoids, internal, with bleeding 09/07/2011   Acquired anal stenosis 09/07/2011   Hyperlipidemia 09/07/2011   Family history of breast cancer in sister 09/07/2011   Past Medical History:  Diagnosis Date   anal ca dx'd 11/2011   squamous cell carcinoma   Anxiety    Arthritis 2022   Depression  Full dentures    Hemorrhoids    external   History of radiation therapy 11/29/11 to 01/05/12   anal canal 5040 cGy 28 sessions, regional lymph nodes 4200 cGy 28 sessions   Hyperlipidemia    Lumbar radiculopathy 10/10/2019   Rectal bleeding    Staphylococcus aureus bacteremia with sepsis    UTI (urinary tract infection) 06/26/2021   Vitamin D deficiency    Wears glasses     Family History  Problem Relation Age of Onset   Cancer Sister        breast/ age 76   Cervical cancer Sister        2758   Alcohol abuse Maternal Uncle     Past Surgical  History:  Procedure Laterality Date   ABDOMINAL HYSTERECTOMY  1978   age 77   BUBBLE STUDY  12/04/2020   Procedure: BUBBLE STUDY;  Surgeon: Parke PoissonAcharya, Gayatri A, MD;  [facility];  Service: Cardiology;;   EXAMINATION UNDER ANESTHESIA  06/21/2012   Procedure: EXAM UNDER ANESTHESIA;  Surgeon: Ernestene MentionHaywood M Ingram, MD;  Location: [facility];  Service: General;  Laterality: N/A;  rectal exam under anesthesia, anal dilation, rectal biopsy   HEMORRHOID SURGERY  1980   Patient had to guess on the date.   RECTAL BIOPSY  06/21/2012   Procedure: BIOPSY RECTAL;  Surgeon: Ernestene MentionHaywood M Ingram, MD;  Location: [facility];  Service: General;  Laterality: N/A;   TEE WITHOUT CARDIOVERSION N/A 12/04/2020   Procedure: TRANSESOPHAGEAL ECHOCARDIOGRAM (TEE);  Surgeon: Parke PoissonAcharya, Gayatri A, MD;  [facility];  Service: Cardiology;  Laterality: N/A;   TOTAL KNEE ARTHROPLASTY Right 06/09/2021   Procedure: RIGHT TOTAL KNEE ARTHROPLASTY;  Surgeon: Cammy Copaean, Charlot Gouin Scott, MD;  [facility];  Service: Orthopedics;  Laterality: Right;   TUBAL LIGATION     b/l  age 77   Social History   Occupational History   Not on file  Tobacco Use   Smoking status: Former    Packs/day: 0.50    Years: 25.00    Additional pack years: 0.00    Total pack years: 12.50    Types: Cigarettes    Quit date: 09/07/1987    Years since quitting: 35.4   Smokeless tobacco: Never  Vaping Use   Vaping Use: Never used  Substance and Sexual Activity   Alcohol use: No   Drug use: No   Sexual activity: Not Currently

## 2023-02-15 DIAGNOSIS — F411 Generalized anxiety disorder: Secondary | ICD-10-CM | POA: Diagnosis not present

## 2023-02-15 DIAGNOSIS — F331 Major depressive disorder, recurrent, moderate: Secondary | ICD-10-CM | POA: Diagnosis not present

## 2023-02-16 DIAGNOSIS — H2513 Age-related nuclear cataract, bilateral: Secondary | ICD-10-CM | POA: Diagnosis not present

## 2023-03-07 ENCOUNTER — Ambulatory Visit (HOSPITAL_BASED_OUTPATIENT_CLINIC_OR_DEPARTMENT_OTHER)
Admission: RE | Admit: 2023-03-07 | Discharge: 2023-03-07 | Disposition: A | Discharge status: Home / Self Care | Payer: Medicare PPO | Source: Ambulatory Visit | Attending: Family Medicine | Admitting: Family Medicine

## 2023-03-07 ENCOUNTER — Other Ambulatory Visit (HOSPITAL_BASED_OUTPATIENT_CLINIC_OR_DEPARTMENT_OTHER): Payer: Self-pay | Admitting: Family Medicine

## 2023-03-07 DIAGNOSIS — R609 Edema, unspecified: Secondary | ICD-10-CM | POA: Diagnosis not present

## 2023-03-07 DIAGNOSIS — R600 Localized edema: Secondary | ICD-10-CM | POA: Diagnosis not present

## 2023-03-07 DIAGNOSIS — I10 Essential (primary) hypertension: Secondary | ICD-10-CM | POA: Diagnosis not present

## 2023-03-07 DIAGNOSIS — L03116 Cellulitis of left lower limb: Secondary | ICD-10-CM | POA: Diagnosis not present

## 2023-03-07 DIAGNOSIS — M7989 Other specified soft tissue disorders: Secondary | ICD-10-CM

## 2023-03-23 DIAGNOSIS — I10 Essential (primary) hypertension: Secondary | ICD-10-CM | POA: Diagnosis not present

## 2023-03-23 DIAGNOSIS — F3341 Major depressive disorder, recurrent, in partial remission: Secondary | ICD-10-CM | POA: Diagnosis not present

## 2023-03-23 DIAGNOSIS — R609 Edema, unspecified: Secondary | ICD-10-CM | POA: Diagnosis not present

## 2023-03-23 DIAGNOSIS — E039 Hypothyroidism, unspecified: Secondary | ICD-10-CM | POA: Diagnosis not present

## 2023-03-23 DIAGNOSIS — L03116 Cellulitis of left lower limb: Secondary | ICD-10-CM | POA: Diagnosis not present

## 2023-03-23 DIAGNOSIS — Z9989 Dependence on other enabling machines and devices: Secondary | ICD-10-CM | POA: Diagnosis not present

## 2023-03-23 DIAGNOSIS — R296 Repeated falls: Secondary | ICD-10-CM | POA: Diagnosis not present

## 2023-03-23 DIAGNOSIS — E785 Hyperlipidemia, unspecified: Secondary | ICD-10-CM | POA: Diagnosis not present

## 2023-03-23 DIAGNOSIS — F419 Anxiety disorder, unspecified: Secondary | ICD-10-CM | POA: Diagnosis not present

## 2023-04-12 DIAGNOSIS — F411 Generalized anxiety disorder: Secondary | ICD-10-CM | POA: Diagnosis not present

## 2023-04-12 DIAGNOSIS — F331 Major depressive disorder, recurrent, moderate: Secondary | ICD-10-CM | POA: Diagnosis not present

## 2023-04-18 ENCOUNTER — Other Ambulatory Visit: Payer: Self-pay

## 2023-04-18 ENCOUNTER — Emergency Department (HOSPITAL_BASED_OUTPATIENT_CLINIC_OR_DEPARTMENT_OTHER): Payer: Medicare PPO

## 2023-04-18 ENCOUNTER — Emergency Department (HOSPITAL_COMMUNITY)
Admission: EM | Admit: 2023-04-18 | Discharge: 2023-04-18 | Disposition: A | Discharge status: Home / Self Care | Payer: Medicare PPO | Attending: Emergency Medicine | Admitting: Emergency Medicine

## 2023-04-18 DIAGNOSIS — L03116 Cellulitis of left lower limb: Secondary | ICD-10-CM | POA: Insufficient documentation

## 2023-04-18 DIAGNOSIS — R52 Pain, unspecified: Secondary | ICD-10-CM

## 2023-04-18 DIAGNOSIS — M7989 Other specified soft tissue disorders: Secondary | ICD-10-CM | POA: Diagnosis present

## 2023-04-18 DIAGNOSIS — R609 Edema, unspecified: Secondary | ICD-10-CM

## 2023-04-18 LAB — CBC WITH DIFFERENTIAL/PLATELET
Abs Immature Granulocytes: 0.01 10*3/uL (ref 0.00–0.07)
Basophils Absolute: 0.1 10*3/uL (ref 0.0–0.1)
Basophils Relative: 1 %
Eosinophils Absolute: 0.1 10*3/uL (ref 0.0–0.5)
Eosinophils Relative: 3 %
HCT: 44.6 % (ref 36.0–46.0)
Hemoglobin: 13.1 g/dL (ref 12.0–15.0)
Immature Granulocytes: 0 %
Lymphocytes Relative: 28 %
Lymphs Abs: 1.3 10*3/uL (ref 0.7–4.0)
MCH: 31.3 pg (ref 26.0–34.0)
MCHC: 29.4 g/dL — ABNORMAL LOW (ref 30.0–36.0)
MCV: 106.7 fL — ABNORMAL HIGH (ref 80.0–100.0)
Monocytes Absolute: 0.3 10*3/uL (ref 0.1–1.0)
Monocytes Relative: 8 %
Neutro Abs: 2.8 10*3/uL (ref 1.7–7.7)
Neutrophils Relative %: 60 %
Platelets: 118 10*3/uL — ABNORMAL LOW (ref 150–400)
RBC: 4.18 MIL/uL (ref 3.87–5.11)
RDW: 13.6 % (ref 11.5–15.5)
WBC: 4.6 10*3/uL (ref 4.0–10.5)
nRBC: 0 % (ref 0.0–0.2)

## 2023-04-18 LAB — BASIC METABOLIC PANEL
Anion gap: 18 — ABNORMAL HIGH (ref 5–15)
BUN: 6 mg/dL — ABNORMAL LOW (ref 8–23)
CO2: 17 mmol/L — ABNORMAL LOW (ref 22–32)
Calcium: 8.9 mg/dL (ref 8.9–10.3)
Chloride: 104 mmol/L (ref 98–111)
Creatinine, Ser: 0.67 mg/dL (ref 0.44–1.00)
GFR, Estimated: >60 mL/min (ref >60)
Glucose, Bld: 89 mg/dL (ref 70–99)
Potassium: 4.3 mmol/L (ref 3.5–5.1)
Sodium: 139 mmol/L (ref 135–145)

## 2023-04-18 MED ORDER — DOXYCYCLINE HYCLATE 100 MG PO CAPS: 100.0000 mg | ORAL_CAPSULE | Freq: Two times a day (BID) | ORAL | 0 refills | Status: DC

## 2023-04-18 NOTE — ED Provider Notes (Signed)
  [place] EMERGENCY DEPARTMENT AT [facility] Provider Note   CSN: [id number] Arrival date & time: 04/18/23  0930     History {Add pertinent medical, surgical, social history, OB history to HPI:1} Chief Complaint  Patient presents with   Rash   Fatigue    [name] is a 77 y.o. female.   Rash      Home Medications Prior to Admission medications   Medication Sig Start Date End Date Taking? Authorizing Provider  ARIPiprazole (ABILIFY) 10 MG tablet Take 10 mg by mouth every morning. 09/07/22   [provider]  celecoxib (CELEBREX) 100 MG capsule Take 1 capsule (100 mg total) by mouth daily as needed for mild pain. Patient taking differently: Take 100 mg by mouth daily. 07/20/21   Magnant, Charles L, PA-C  DULoxetine (CYMBALTA) 60 MG capsule Take 120 mg by mouth daily.    [provider]  furosemide (LASIX) 40 MG tablet Take 40 mg by mouth daily.    [provider]  gabapentin (NEURONTIN) 600 MG tablet Take 600 mg by mouth 2 (two) times daily. 07/14/16   [provider]  levothyroxine (SYNTHROID, LEVOTHROID) 25 MCG tablet Take 25 mcg by mouth daily before breakfast. 08/29/18   [provider]  potassium chloride (KLOR-CON) 10 MEQ tablet Take 10 mEq by mouth 2 (two) times daily. 06/24/22   [provider]  pravastatin (PRAVACHOL) 40 MG tablet Take 40 mg by mouth daily.    [provider]  traZODone (DESYREL) 100 MG tablet Take 200 mg by mouth at bedtime.    [provider]      Allergies    Patient has no known allergies.    Review of Systems   Review of Systems  Skin:  Positive for rash.    Physical Exam Updated Vital Signs BP (!) 168/97 (BP Location: Right Arm)   Pulse 72   Resp 16   SpO2 98%  Physical Exam  ED Results / Procedures / Treatments   Labs (all labs ordered are listed, but only abnormal results are displayed) Labs Reviewed - No data to  display  EKG None  Radiology No results found.  Procedures Procedures  {Document cardiac monitor, telemetry assessment procedure when appropriate:1}  Medications Ordered in ED Medications - No data to display  ED Course/ Medical Decision Making/ A&P   {   Click here for ABCD2, HEART and other calculatorsREFRESH Note before signing :1}                          Medical Decision Making  ***  {Document critical care time when appropriate:1} {Document review of labs and clinical decision tools ie heart score, Chads2Vasc2 etc:1}  {Document your independent review of radiology images, and any outside records:1} {Document your discussion with family members, caretakers, and with consultants:1} {Document social determinants of health affecting pt's care:1} {Document your decision making why or why not admission, treatments were needed:1} Final Clinical Impression(s) / ED Diagnoses Final diagnoses:  None    Rx / DC Orders ED Discharge Orders     None

## 2023-04-18 NOTE — Discharge Instructions (Addendum)
Your labs and [place] were reassuring. Symptoms are consistent with a left lower extremity cellulitis. Take Doxycycline and follow-up with your PCP, return for any worsening symptoms despite antibiotics. [place] results: Summary:  RIGHT:  - No evidence of common femoral vein obstruction.     LEFT:  - There is no evidence of deep vein thrombosis in the lower extremity.     - No cystic structure found in the popliteal fossa.

## 2023-04-18 NOTE — ED Notes (Signed)
Pt transported to [place] for DVT study.

## 2023-04-18 NOTE — Progress Notes (Signed)
Left lower extremity venous study completed.   Preliminary results relayed to MD in ER.  Please see CV Procedures for preliminary results.  Sydnee Lamour, RVT  10:45 AM 04/18/23

## 2023-04-18 NOTE — ED Triage Notes (Signed)
Pt BIB POV for rash on the bilateral hands, left leg swollen and taught, right leg has red spots per patient. Pt denies any pain at this time. Bilateral pedal pulses intact.

## 2023-04-21 DIAGNOSIS — L03116 Cellulitis of left lower limb: Secondary | ICD-10-CM | POA: Diagnosis not present

## 2023-05-04 DIAGNOSIS — M816 Localized osteoporosis [Lequesne]: Secondary | ICD-10-CM | POA: Diagnosis not present

## 2023-05-04 DIAGNOSIS — N958 Other specified menopausal and perimenopausal disorders: Secondary | ICD-10-CM | POA: Diagnosis not present

## 2023-05-04 DIAGNOSIS — Z1231 Encounter for screening mammogram for malignant neoplasm of breast: Secondary | ICD-10-CM | POA: Diagnosis not present

## 2023-05-24 DIAGNOSIS — F411 Generalized anxiety disorder: Secondary | ICD-10-CM | POA: Diagnosis not present

## 2023-05-24 DIAGNOSIS — F331 Major depressive disorder, recurrent, moderate: Secondary | ICD-10-CM | POA: Diagnosis not present

## 2023-06-10 DIAGNOSIS — H8110 Benign paroxysmal vertigo, unspecified ear: Secondary | ICD-10-CM | POA: Diagnosis not present

## 2023-06-10 DIAGNOSIS — I10 Essential (primary) hypertension: Secondary | ICD-10-CM | POA: Diagnosis not present

## 2023-06-15 ENCOUNTER — Other Ambulatory Visit: Payer: Self-pay

## 2023-06-15 ENCOUNTER — Emergency Department (HOSPITAL_COMMUNITY): Payer: Medicare PPO

## 2023-06-15 ENCOUNTER — Emergency Department (HOSPITAL_COMMUNITY)
Admission: EM | Admit: 2023-06-15 | Discharge: 2023-06-15 | Disposition: A | Discharge status: Home / Self Care | Payer: Medicare PPO | Attending: Emergency Medicine | Admitting: Emergency Medicine

## 2023-06-15 ENCOUNTER — Encounter (HOSPITAL_COMMUNITY): Payer: Self-pay

## 2023-06-15 DIAGNOSIS — K449 Diaphragmatic hernia without obstruction or gangrene: Secondary | ICD-10-CM | POA: Diagnosis not present

## 2023-06-15 DIAGNOSIS — I700 Atherosclerosis of aorta: Secondary | ICD-10-CM | POA: Insufficient documentation

## 2023-06-15 DIAGNOSIS — R918 Other nonspecific abnormal finding of lung field: Secondary | ICD-10-CM | POA: Diagnosis not present

## 2023-06-15 DIAGNOSIS — J9811 Atelectasis: Secondary | ICD-10-CM | POA: Diagnosis not present

## 2023-06-15 DIAGNOSIS — R109 Unspecified abdominal pain: Secondary | ICD-10-CM | POA: Diagnosis not present

## 2023-06-15 DIAGNOSIS — Z7401 Bed confinement status: Secondary | ICD-10-CM | POA: Diagnosis not present

## 2023-06-15 DIAGNOSIS — R4182 Altered mental status, unspecified: Secondary | ICD-10-CM | POA: Diagnosis not present

## 2023-06-15 DIAGNOSIS — Z90710 Acquired absence of both cervix and uterus: Secondary | ICD-10-CM | POA: Diagnosis not present

## 2023-06-15 DIAGNOSIS — R531 Weakness: Secondary | ICD-10-CM | POA: Diagnosis not present

## 2023-06-15 DIAGNOSIS — K5900 Constipation, unspecified: Secondary | ICD-10-CM | POA: Diagnosis not present

## 2023-06-15 DIAGNOSIS — R251 Tremor, unspecified: Secondary | ICD-10-CM

## 2023-06-15 DIAGNOSIS — F29 Unspecified psychosis not due to a substance or known physiological condition: Secondary | ICD-10-CM | POA: Diagnosis not present

## 2023-06-15 DIAGNOSIS — Z1152 Encounter for screening for COVID-19: Secondary | ICD-10-CM | POA: Diagnosis not present

## 2023-06-15 DIAGNOSIS — R9389 Abnormal findings on diagnostic imaging of other specified body structures: Secondary | ICD-10-CM | POA: Diagnosis not present

## 2023-06-15 LAB — CBC WITH DIFFERENTIAL/PLATELET
Abs Immature Granulocytes: 0.01 10*3/uL (ref 0.00–0.07)
Basophils Absolute: 0 10*3/uL (ref 0.0–0.1)
Basophils Relative: 1 %
Eosinophils Absolute: 0.1 10*3/uL (ref 0.0–0.5)
Eosinophils Relative: 3 %
HCT: 41.6 % (ref 36.0–46.0)
Hemoglobin: 13.4 g/dL (ref 12.0–15.0)
Immature Granulocytes: 0 %
Lymphocytes Relative: 24 %
Lymphs Abs: 1 10*3/uL (ref 0.7–4.0)
MCH: 31.3 pg (ref 26.0–34.0)
MCHC: 32.2 g/dL (ref 30.0–36.0)
MCV: 97.2 fL (ref 80.0–100.0)
Monocytes Absolute: 0.2 10*3/uL (ref 0.1–1.0)
Monocytes Relative: 6 %
Neutro Abs: 2.7 10*3/uL (ref 1.7–7.7)
Neutrophils Relative %: 66 %
Platelets: 206 10*3/uL (ref 150–400)
RBC: 4.28 MIL/uL (ref 3.87–5.11)
RDW: 13.2 % (ref 11.5–15.5)
WBC: 4.1 10*3/uL (ref 4.0–10.5)
nRBC: 0 % (ref 0.0–0.2)

## 2023-06-15 LAB — BRAIN NATRIURETIC PEPTIDE: B Natriuretic Peptide: 49 pg/mL (ref 0.0–100.0)

## 2023-06-15 LAB — RESP PANEL BY RT-PCR (RSV, FLU A&B, COVID)  RVPGX2
Influenza A by PCR: NEGATIVE
Influenza B by PCR: NEGATIVE
Resp Syncytial Virus by PCR: NEGATIVE
SARS Coronavirus 2 by RT PCR: NEGATIVE

## 2023-06-15 LAB — URINALYSIS, W/ REFLEX TO CULTURE (INFECTION SUSPECTED)
Bacteria, UA: NONE SEEN
Bilirubin Urine: NEGATIVE
Glucose, UA: NEGATIVE mg/dL
Hgb urine dipstick: NEGATIVE
Ketones, ur: NEGATIVE mg/dL
Nitrite: NEGATIVE
Protein, ur: NEGATIVE mg/dL
Specific Gravity, Urine: 1.008 (ref 1.005–1.030)
pH: 9 — ABNORMAL HIGH (ref 5.0–8.0)

## 2023-06-15 LAB — TROPONIN I (HIGH SENSITIVITY): Troponin I (High Sensitivity): 6 ng/L (ref <18)

## 2023-06-15 LAB — COMPREHENSIVE METABOLIC PANEL
ALT: 23 U/L (ref 0–44)
AST: 32 U/L (ref 15–41)
Albumin: 4.1 g/dL (ref 3.5–5.0)
Alkaline Phosphatase: 66 U/L (ref 38–126)
Anion gap: 10 (ref 5–15)
BUN: 10 mg/dL (ref 8–23)
CO2: 27 mmol/L (ref 22–32)
Calcium: 8.8 mg/dL — ABNORMAL LOW (ref 8.9–10.3)
Chloride: 103 mmol/L (ref 98–111)
Creatinine, Ser: 0.82 mg/dL (ref 0.44–1.00)
GFR, Estimated: >60 mL/min (ref >60)
Glucose, Bld: 107 mg/dL — ABNORMAL HIGH (ref 70–99)
Potassium: 4.3 mmol/L (ref 3.5–5.1)
Sodium: 140 mmol/L (ref 135–145)
Total Bilirubin: 0.7 mg/dL (ref 0.3–1.2)
Total Protein: 7.2 g/dL (ref 6.5–8.1)

## 2023-06-15 LAB — MAGNESIUM: Magnesium: 2.4 mg/dL (ref 1.7–2.4)

## 2023-06-15 LAB — I-STAT CG4 LACTIC ACID, ED: Lactic Acid, Venous: 1.5 mmol/L (ref 0.5–1.9)

## 2023-06-15 MED ORDER — LORAZEPAM 1 MG PO TABS
0.5000 mg | ORAL_TABLET | Freq: Once | ORAL | Status: AC
  Administered 2023-06-15: 0.5 mg via ORAL
  Filled 2023-06-15: qty 1

## 2023-06-15 MED ORDER — IOHEXOL 350 MG/ML SOLN
75.0000 mL | Freq: Once | INTRAVENOUS | Status: AC | PRN
  Administered 2023-06-15: 75 mL via INTRAVENOUS

## 2023-06-15 MED ORDER — LACTATED RINGERS IV BOLUS (SEPSIS)
500.0000 mL | Freq: Once | INTRAVENOUS | Status: AC
  Administered 2023-06-15: 500 mL via INTRAVENOUS

## 2023-06-15 NOTE — ED Triage Notes (Signed)
Pt arrived via ems from home lives alone. Pt c/o weakness ans shaking that began at about midnight

## 2023-06-15 NOTE — Discharge Instructions (Signed)
Test results today are reassuring.  It looks like you have a bit of constipation.  Take over-the-counter MiraLAX to help move your bowels.  Keep your primary doctor appointment on Friday.  Return to the emergency department for any new or worsening symptoms of concern.

## 2023-06-15 NOTE — ED Notes (Signed)
Rn spoke with daughter, will leave keys at home for pt

## 2023-06-15 NOTE — ED Notes (Signed)
Pt given meal bag at this time

## 2023-06-15 NOTE — ED Provider Notes (Signed)
[place] EMERGENCY DEPARTMENT AT [facility] Provider Note   CSN: [id number] Arrival date & time: 06/15/23  7829     History  Chief Complaint  Patient presents with   Weakness    Arrived via ems from home c/o weakness since midnight    [name] is a 77 y.o. female.  HPI Patient presents for generalized weakness.  Medical history includes HLD, anxiety, depression, arthritis.  She lives independently.  She ambulates with a cane at baseline.  Last night, she states that she began to feel weak and shaky.  She is been up since midnight.  She called her neighbor this morning at 6 AM.  Neighbor came and called EMS.  Currently, she denies any areas of pain.  She denies any recent vomiting or diarrhea.    Home Medications Prior to Admission medications   Medication Sig Start Date End Date Taking? Authorizing Provider  ARIPiprazole (ABILIFY) 10 MG tablet Take 10 mg by mouth every morning. 09/07/22   [provider]  celecoxib (CELEBREX) 100 MG capsule Take 1 capsule (100 mg total) by mouth daily as needed for mild pain. Patient taking differently: Take 100 mg by mouth daily. 07/20/21   Magnant, Joycie Peek, PA-C  doxycycline (VIBRAMYCIN) 100 MG capsule Take 1 capsule (100 mg total) by mouth 2 (two) times daily. 04/18/23   Ernie Avena, MD  DULoxetine (CYMBALTA) 60 MG capsule Take 120 mg by mouth daily.    [provider]  furosemide (LASIX) 40 MG tablet Take 40 mg by mouth daily.    [provider]  gabapentin (NEURONTIN) 600 MG tablet Take 600 mg by mouth 2 (two) times daily. 07/14/16   [provider]  levothyroxine (SYNTHROID, LEVOTHROID) 25 MCG tablet Take 25 mcg by mouth daily before breakfast. 08/29/18   [provider]  potassium chloride (KLOR-CON) 10 MEQ tablet Take 10 mEq by mouth 2 (two) times daily. 06/24/22   [provider]  pravastatin (PRAVACHOL) 40 MG tablet Take 40 mg by mouth daily.    [provider]  traZODone (DESYREL) 100 MG tablet Take 200 mg by mouth at bedtime.    [provider]      Allergies    Patient has no known allergies.    Review of Systems   Review of Systems  Neurological:  Positive for tremors and weakness (Generalized).  All other systems reviewed and are negative.   Physical Exam Updated Vital Signs BP (!) 151/70   Pulse 86   Temp 97.8 F (36.6 C) (Oral)   Resp 18   Ht 5\' 5"  (1.651 m)   Wt 75 kg   SpO2 98%   BMI 27.51 kg/m  Physical Exam Vitals and nursing note reviewed.  Constitutional:      General: She is not in acute distress.    Appearance: Normal appearance. She is well-developed. She is not ill-appearing, toxic-appearing or diaphoretic.  HENT:     Head: Normocephalic and atraumatic.     Right Ear: External ear normal.     Left Ear: External ear normal.     Nose: Nose normal.     Mouth/Throat:     Mouth: Mucous membranes are moist.  Eyes:     Extraocular Movements: Extraocular movements intact.     Conjunctiva/sclera: Conjunctivae normal.  Cardiovascular:     Rate and Rhythm: Normal rate and regular rhythm.     Heart sounds: No murmur heard. Pulmonary:     Effort: Pulmonary effort  is normal. No respiratory distress.     Breath sounds: Normal breath sounds. No wheezing or rales.  Chest:     Chest wall: No tenderness.  Abdominal:     General: There is no distension.     Palpations: Abdomen is soft.     Tenderness: There is no abdominal tenderness. There is right CVA tenderness and left CVA tenderness.  Musculoskeletal:        General: No swelling. Normal range of motion.     Cervical back: Normal range of motion and neck supple.     Right lower leg: No edema.     Left lower leg: No edema.  Skin:    General: Skin is warm and dry.     Coloration: Skin is not jaundiced or pale.  Neurological:     General: No focal deficit present.     Mental Status: She is alert and oriented to person, place, and time.  Psychiatric:         Mood and Affect: Mood normal.        Behavior: Behavior normal.        Thought Content: Thought content normal.        Judgment: Judgment normal.     ED Results / Procedures / Treatments   Labs (all labs ordered are listed, but only abnormal results are displayed) Labs Reviewed  COMPREHENSIVE METABOLIC PANEL - Abnormal; Notable for the following components:      Result Value   Glucose, Bld 107 (*)    Calcium 8.8 (*)    All other components within normal limits  URINALYSIS, W/ REFLEX TO CULTURE (INFECTION SUSPECTED) - Abnormal; Notable for the following components:   pH 9.0 (*)    Leukocytes,Ua TRACE (*)    All other components within normal limits  RESP PANEL BY RT-PCR (RSV, FLU A&B, COVID)  [id number]  CULTURE, BLOOD (ROUTINE X 2)  CULTURE, BLOOD (ROUTINE X 2)  CBC WITH DIFFERENTIAL/PLATELET  BRAIN NATRIURETIC PEPTIDE  MAGNESIUM  I-STAT CG4 LACTIC ACID, ED  TROPONIN I (HIGH SENSITIVITY)    EKG EKG Interpretation Date/Time:  Wednesday June 15 2023 08:04:04 EDT Ventricular Rate:  70 PR Interval:  133 QRS Duration:  150 QT Interval:  503 QTC Calculation: 543 R Axis:   -43  Text Interpretation: Sinus rhythm Nonspecific IVCD with LAD Confirmed by Gloris Manchester ([id number]) on 06/15/2023 8:40:00 AM  Radiology CT ABDOMEN PELVIS W CONTRAST  Result Date: 06/15/2023 CLINICAL DATA:  Abdominal pain, acute, nonlocalized EXAM: CT ABDOMEN AND PELVIS WITH CONTRAST TECHNIQUE: Multidetector CT imaging of the abdomen and pelvis was performed using the standard protocol following bolus administration of intravenous contrast. RADIATION DOSE REDUCTION: This exam was performed according to the departmental dose-optimization program which includes automated exposure control, adjustment of the mA and/or kV according to patient size and/or use of iterative reconstruction technique. CONTRAST:  75mL OMNIPAQUE IOHEXOL 350 MG/ML SOLN COMPARISON:  None Available. FINDINGS: Lower chest: Mild dependent  atelectasis is present both bases. The heart size is normal. Hepatobiliary: No focal liver abnormality is seen. No gallstones, gallbladder wall thickening, or biliary dilatation. Pancreas: Unremarkable. No pancreatic ductal dilatation or surrounding inflammatory changes. Spleen: Normal in size without focal abnormality. Adrenals/Urinary Tract: The adrenal glands are normal bilaterally. Kidneys and ureters are normal. No stone or mass lesion is present. No obstruction is present. Stomach/Bowel: A small hiatal hernia is present. Stomach and duodenum are otherwise within normal limits. Small bowel is unremarkable. Terminal ileum is normal. The appendix is not  discretely visualized and may be surgically absent. The ascending and transverse colon are normal. Descending and sigmoid colon is normal. The rectum is slightly dilated in stool filled. Maximum transverse diameter is 5.4 cm. Vascular/Lymphatic: Atherosclerotic changes are present at the scratched at atherosclerotic changes are present in the aorta and branch vessels. No aneurysm is present. Reproductive: Status post hysterectomy. No adnexal masses. Other: No abdominal wall hernia or abnormality. No abdominopelvic ascites. Musculoskeletal: Multilevel degenerative changes are present in the thoracolumbar spine. A vertebral plana compression fracture at T10 demonstrates gas within the fracture cleft. It is incompletely healed. Remote compression fractures at L1, L3 and L5 are stable. Vertebral body heights are maintained T12, L2 and L4. No focal osseous lesions are present. Bony pelvis is normal. The hips are located and within normal limits bilaterally. IMPRESSION: 1. No acute or focal lesion to explain the patient's abdominal pain. 2. Slightly dilated stool filled rectum with a maximum transverse diameter of 5.4 cm. 3. Small hiatal hernia. 4. Multilevel degenerative changes in the thoracolumbar spine. 5. Vertebral plana compression fracture at T10 demonstrates gas  within the fracture cleft. It is incompletely healed. 6. Remote compression fractures at L1, L3, and L5 are stable. 7.  Aortic Atherosclerosis (ICD10-I70.0). Electronically Signed   By: Marin Roberts M.D.   On: 06/15/2023 14:14   CT Angio Chest PE W and/or Wo Contrast  Result Date: 06/15/2023 CLINICAL DATA:  Pulmonary embolism suspected, high probability weakness and shaking since midnight. EXAM: CT ANGIOGRAPHY CHEST WITH CONTRAST TECHNIQUE: Multidetector CT imaging of the chest was performed using the standard protocol during bolus administration of intravenous contrast. Multiplanar CT image reconstructions and MIPs were obtained to evaluate the vascular anatomy. RADIATION DOSE REDUCTION: This exam was performed according to the departmental dose-optimization program which includes automated exposure control, adjustment of the mA and/or kV according to patient size and/or use of iterative reconstruction technique. CONTRAST:  75mL OMNIPAQUE IOHEXOL 350 MG/ML SOLN COMPARISON:  One-view chest x-ray 06/15/2023. CT of the chest 10/10/2022 FINDINGS: Cardiovascular: The heart size is normal. Atherosclerotic changes are present at the aortic arch and descending thoracic aorta. No significant stenosis is present at the origins the great vessels. No aneurysm is present. Pulmonary artery opacification is excellent. No focal filling defects are present to suggest pulmonary embolus. Pulmonary artery size is normal. Mediastinum/Nodes: No enlarged mediastinal, hilar, or axillary lymph nodes. Thyroid gland, trachea, and esophagus demonstrate no significant findings. Lungs/Pleura: A calcified granuloma the left lung apex measures 3.5 mm. Mild dependent atelectasis is present bilaterally. No other nodule or mass lesion is present. No significant pleural disease is present. Upper Abdomen: Limited imaging the abdomen is unremarkable. There is no significant adenopathy. No solid organ lesions are present. Musculoskeletal: A  vertebral plana compression fracture is present at T6, predominantly involving the inferior endplate. No significant retropulsed bone is present. A second vertebral plana compression fracture is present with a vacuum phenomenon in the vertebral body at T10. Superior endplate Schmorl's node at T11 is stable. The superior endplate fracture at L1 is new since the prior chest CT, but is not acute. No acute or healing rib fractures are present. No chest wall mass is present. The sternum is unremarkable. Review of the MIP images confirms the above findings. IMPRESSION: 1. No pulmonary embolus. 2. Vertebral plana compression fractures at T6 and T10 are incompletely healed and may be symptomatic. 3. Superior endplate fracture at L1 is new since the prior chest CT, but is not acute. 4.  Aortic Atherosclerosis (  ICD10-I70.0). Electronically Signed   By: Marin Roberts M.D.   On: 06/15/2023 13:53   CT Head Wo Contrast  Result Date: 06/15/2023 CLINICAL DATA:  Mental status change with unknown cause. EXAM: CT HEAD WITHOUT CONTRAST TECHNIQUE: Contiguous axial images were obtained from the base of the skull through the vertex without intravenous contrast. RADIATION DOSE REDUCTION: This exam was performed according to the departmental dose-optimization program which includes automated exposure control, adjustment of the mA and/or kV according to patient size and/or use of iterative reconstruction technique. COMPARISON:  Head CT 06/25/2021 FINDINGS: Brain: No evidence of acute infarction, hemorrhage, hydrocephalus, extra-axial collection or mass lesion/mass effect. Vascular: No hyperdense vessel or unexpected calcification. Skull: Normal. Negative for fracture or focal lesion. Sinuses/Orbits: Unremarkable IMPRESSION: Stable and negative head CT. Electronically Signed   By: Tiburcio Pea M.D.   On: 06/15/2023 13:44   DG Chest Port 1 View  Result Date: 06/15/2023 CLINICAL DATA:  Possible sepsis. EXAM: PORTABLE CHEST 1  VIEW COMPARISON:  12/04/2022 FINDINGS: Low volume film with asymmetric elevation right hemidiaphragm, similar to prior. The cardio pericardial silhouette is enlarged. Streaky opacity at the left base is again noted suggesting chronic atelectasis or scarring. No pulmonary edema or new consolidative airspace disease. Bones are diffusely demineralized. IMPRESSION: Low volume film with chronic atelectasis or scarring at the left base. Electronically Signed   By: Kennith Center M.D.   On: 06/15/2023 07:41    Procedures Procedures    Medications Ordered in ED Medications  lactated ringers bolus 500 mL (0 mLs Intravenous Stopped 06/15/23 0923)  lactated ringers bolus 500 mL (0 mLs Intravenous Stopped 06/15/23 1045)  LORazepam (ATIVAN) tablet 0.5 mg (0.5 mg Oral Given 06/15/23 1241)  iohexol (OMNIPAQUE) 350 MG/ML injection 75 mL (75 mLs Intravenous Contrast Given 06/15/23 1227)    ED Course/ Medical Decision Making/ A&P                                 Medical Decision Making Amount and/or Complexity of Data Reviewed Labs: ordered. Radiology: ordered. ECG/medicine tests: ordered.  Risk Prescription drug management.   This patient presents to the ED for concern of generalized weakness, this involves an extensive number of treatment options, and is a complaint that carries with it a high risk of complications and morbidity.  The differential diagnosis includes infection, polypharmacy, dehydration, metabolic arrangement, anxiety   Co morbidities that complicate the patient evaluation  HLD, anxiety, depression, arthritis   Additional history obtained:  Additional history obtained from EMS External records from outside source obtained and reviewed including EMR   Lab Tests:  I Ordered, and personally interpreted labs.  The pertinent results include: Normal hemoglobin, no leukocytosis, normal kidney function, normal electrolytes, normal troponin, no evidence of UTI, normal BNP, normal  lactate   Imaging Studies ordered:  I ordered imaging studies including chest x-ray, CT head, CTA chest, CT of abdomen and pelvis I independently visualized and interpreted imaging which showed no acute findings I agree with the radiologist interpretation   Cardiac Monitoring: / EKG:  The patient was maintained on a cardiac monitor.  I personally viewed and interpreted the cardiac monitored which showed an underlying rhythm of: Sinus rhythm  Problem List / ED Course / Critical interventions / Medication management  Patient presenting for generalized weakness and tremor since last night.  Vital signs with EMS were notable for moderate hypertension.  On arrival, patient is alert and oriented.  She has no focal neurologic deficits.  She denies any areas of discomfort.  Abdomen is soft and nontender.  Endorses a very mild bilateral CVA tenderness.  Workup was initiated.  Urinalysis did not show evidence of UTI.  Serum lab work is unremarkable.  Patient underwent imaging studies which also showed no acute findings.  Vital signs remained normal.  Patient remained well-appearing on exam.  Patient was informed of reassuring workup results.  She does state that she wishes to go home.  She has a PCP appointment on Friday.  She was discharged in stable condition. I ordered medication including IV fluids for hydration; Ativan for anxiety Reevaluation of the patient after these medicines showed that the patient improved I have reviewed the patients home medicines and have made adjustments as needed   Social Determinants of Health:  Lives independently        Final Clinical Impression(s) / ED Diagnoses Final diagnoses:  Generalized weakness  Shaking    Rx / DC Orders ED Discharge Orders     None         Gloris Manchester, MD 06/15/23 1442

## 2023-06-15 NOTE — ED Notes (Signed)
Patient transported to CT 

## 2023-06-17 DIAGNOSIS — R300 Dysuria: Secondary | ICD-10-CM | POA: Diagnosis not present

## 2023-06-17 DIAGNOSIS — M5136 Other intervertebral disc degeneration, lumbar region: Secondary | ICD-10-CM | POA: Diagnosis not present

## 2023-06-17 DIAGNOSIS — R42 Dizziness and giddiness: Secondary | ICD-10-CM | POA: Diagnosis not present

## 2023-06-17 DIAGNOSIS — E785 Hyperlipidemia, unspecified: Secondary | ICD-10-CM | POA: Diagnosis not present

## 2023-06-17 DIAGNOSIS — R410 Disorientation, unspecified: Secondary | ICD-10-CM | POA: Diagnosis not present

## 2023-06-17 DIAGNOSIS — R413 Other amnesia: Secondary | ICD-10-CM | POA: Diagnosis not present

## 2023-06-17 DIAGNOSIS — I10 Essential (primary) hypertension: Secondary | ICD-10-CM | POA: Diagnosis not present

## 2023-06-17 DIAGNOSIS — R609 Edema, unspecified: Secondary | ICD-10-CM | POA: Diagnosis not present

## 2023-06-17 DIAGNOSIS — E039 Hypothyroidism, unspecified: Secondary | ICD-10-CM | POA: Diagnosis not present

## 2023-06-20 ENCOUNTER — Other Ambulatory Visit: Payer: Self-pay | Admitting: Family Medicine

## 2023-06-20 DIAGNOSIS — R413 Other amnesia: Secondary | ICD-10-CM

## 2023-06-20 LAB — CULTURE, BLOOD (ROUTINE X 2)
Culture: NO GROWTH
Culture: NO GROWTH
Special Requests: ADEQUATE

## 2023-06-22 ENCOUNTER — Emergency Department (HOSPITAL_COMMUNITY): Payer: Medicare PPO

## 2023-06-22 ENCOUNTER — Emergency Department (HOSPITAL_COMMUNITY)
Admission: EM | Admit: 2023-06-22 | Discharge: 2023-06-23 | Disposition: A | Discharge status: Home / Self Care | Payer: Medicare PPO | Attending: Emergency Medicine | Admitting: Emergency Medicine

## 2023-06-22 ENCOUNTER — Other Ambulatory Visit: Payer: Self-pay

## 2023-06-22 ENCOUNTER — Encounter (HOSPITAL_COMMUNITY): Payer: Self-pay | Admitting: Emergency Medicine

## 2023-06-22 DIAGNOSIS — R42 Dizziness and giddiness: Secondary | ICD-10-CM | POA: Diagnosis not present

## 2023-06-22 DIAGNOSIS — M810 Age-related osteoporosis without current pathological fracture: Secondary | ICD-10-CM | POA: Diagnosis not present

## 2023-06-22 DIAGNOSIS — M2041 Other hammer toe(s) (acquired), right foot: Secondary | ICD-10-CM | POA: Diagnosis not present

## 2023-06-22 DIAGNOSIS — F419 Anxiety disorder, unspecified: Secondary | ICD-10-CM | POA: Diagnosis not present

## 2023-06-22 DIAGNOSIS — M2042 Other hammer toe(s) (acquired), left foot: Secondary | ICD-10-CM | POA: Diagnosis not present

## 2023-06-22 DIAGNOSIS — M48061 Spinal stenosis, lumbar region without neurogenic claudication: Secondary | ICD-10-CM | POA: Diagnosis not present

## 2023-06-22 DIAGNOSIS — I700 Atherosclerosis of aorta: Secondary | ICD-10-CM | POA: Diagnosis not present

## 2023-06-22 DIAGNOSIS — R531 Weakness: Secondary | ICD-10-CM | POA: Insufficient documentation

## 2023-06-22 DIAGNOSIS — I10 Essential (primary) hypertension: Secondary | ICD-10-CM | POA: Diagnosis not present

## 2023-06-22 DIAGNOSIS — R4182 Altered mental status, unspecified: Secondary | ICD-10-CM | POA: Diagnosis not present

## 2023-06-22 DIAGNOSIS — I959 Hypotension, unspecified: Secondary | ICD-10-CM | POA: Diagnosis not present

## 2023-06-22 DIAGNOSIS — F3341 Major depressive disorder, recurrent, in partial remission: Secondary | ICD-10-CM | POA: Diagnosis not present

## 2023-06-22 DIAGNOSIS — M5136 Other intervertebral disc degeneration, lumbar region: Secondary | ICD-10-CM | POA: Diagnosis not present

## 2023-06-22 DIAGNOSIS — G319 Degenerative disease of nervous system, unspecified: Secondary | ICD-10-CM | POA: Diagnosis not present

## 2023-06-22 LAB — URINALYSIS, ROUTINE W REFLEX MICROSCOPIC
Bilirubin Urine: NEGATIVE
Glucose, UA: NEGATIVE mg/dL
Hgb urine dipstick: NEGATIVE
Ketones, ur: NEGATIVE mg/dL
Leukocytes,Ua: NEGATIVE
Nitrite: NEGATIVE
Protein, ur: NEGATIVE mg/dL
Specific Gravity, Urine: 1.018 (ref 1.005–1.030)
pH: 5 (ref 5.0–8.0)

## 2023-06-22 LAB — CBC
HCT: 40.4 % (ref 36.0–46.0)
Hemoglobin: 13.1 g/dL (ref 12.0–15.0)
MCH: 31.3 pg (ref 26.0–34.0)
MCHC: 32.4 g/dL (ref 30.0–36.0)
MCV: 96.4 fL (ref 80.0–100.0)
Platelets: 228 10*3/uL (ref 150–400)
RBC: 4.19 MIL/uL (ref 3.87–5.11)
RDW: 13.7 % (ref 11.5–15.5)
WBC: 6.5 10*3/uL (ref 4.0–10.5)
nRBC: 0 % (ref 0.0–0.2)

## 2023-06-22 LAB — COMPREHENSIVE METABOLIC PANEL
ALT: 21 U/L (ref 0–44)
AST: 21 U/L (ref 15–41)
Albumin: 4.2 g/dL (ref 3.5–5.0)
Alkaline Phosphatase: 73 U/L (ref 38–126)
Anion gap: 11 (ref 5–15)
BUN: 18 mg/dL (ref 8–23)
CO2: 29 mmol/L (ref 22–32)
Calcium: 9.4 mg/dL (ref 8.9–10.3)
Chloride: 99 mmol/L (ref 98–111)
Creatinine, Ser: 0.71 mg/dL (ref 0.44–1.00)
GFR, Estimated: >60 mL/min (ref >60)
Glucose, Bld: 106 mg/dL — ABNORMAL HIGH (ref 70–99)
Potassium: 3.3 mmol/L — ABNORMAL LOW (ref 3.5–5.1)
Sodium: 139 mmol/L (ref 135–145)
Total Bilirubin: 0.6 mg/dL (ref 0.3–1.2)
Total Protein: 7.2 g/dL (ref 6.5–8.1)

## 2023-06-22 LAB — CBG MONITORING, ED: Glucose-Capillary: 81 mg/dL (ref 70–99)

## 2023-06-22 MED ORDER — GADOBUTROL 1 MMOL/ML IV SOLN: 7.0000 mL | Freq: Once | INTRAVENOUS | Status: DC | PRN

## 2023-06-22 NOTE — ED Provider Notes (Signed)
[place] EMERGENCY DEPARTMENT AT [facility] Provider Note   CSN: [id number] Arrival date & time: 06/22/23  1449     History {Add pertinent medical, surgical, social history, OB history to HPI:1} Chief Complaint  Patient presents with   Weakness    [name] is a 77 y.o. female.  Patient is a poor historian and complains only of generalized weakness and "giving out". Cannot provide detail but denies syncope, fall or injury. No vomiting. Per daughter, April, the patient went to a neighbor's house today and relayed she needed to go to the hospital. Daughter reports gradual cognitive decline over several weeks but more so in the last 2 weeks. Seen recently in the ED (06/15/23) for c/o weakness with abdominal pain, and was discharged after a negative work up. Per daughter, she had follow up with PCP (Dr. Laurann Montana) last week and limited in-home care was arranged.  The history is provided by the patient and a relative. No language interpreter was used.  Weakness      Home Medications Prior to Admission medications   Medication Sig Start Date End Date Taking? Authorizing Provider  ARIPiprazole (ABILIFY) 10 MG tablet Take 10 mg by mouth every morning. 09/07/22   [provider]  celecoxib (CELEBREX) 100 MG capsule Take 1 capsule (100 mg total) by mouth daily as needed for mild pain. Patient taking differently: Take 100 mg by mouth daily. 07/20/21   Magnant, Joycie Peek, PA-C  doxycycline (VIBRAMYCIN) 100 MG capsule Take 1 capsule (100 mg total) by mouth 2 (two) times daily. 04/18/23   Ernie Avena, MD  DULoxetine (CYMBALTA) 60 MG capsule Take 120 mg by mouth daily.    [provider]  furosemide (LASIX) 40 MG tablet Take 40 mg by mouth daily.    [provider]  gabapentin (NEURONTIN) 600 MG tablet Take 600 mg by mouth 2 (two) times daily. 07/14/16   [provider]  levothyroxine (SYNTHROID, LEVOTHROID) 25 MCG tablet Take 25 mcg by  mouth daily before breakfast. 08/29/18   [provider]  potassium chloride (KLOR-CON) 10 MEQ tablet Take 10 mEq by mouth 2 (two) times daily. 06/24/22   [provider]  pravastatin (PRAVACHOL) 40 MG tablet Take 40 mg by mouth daily.    [provider]  traZODone (DESYREL) 100 MG tablet Take 200 mg by mouth at bedtime.    [provider]      Allergies    Patient has no known allergies.    Review of Systems   Review of Systems  Neurological:  Positive for weakness.    Physical Exam Updated Vital Signs BP 122/66 (BP Location: Right Arm)   Pulse 70   Temp 97.6 F (36.4 C) (Tympanic)   Resp 16   Ht 5\' 5"  (1.651 m)   Wt 75 kg   SpO2 97%   BMI 27.51 kg/m  Physical Exam  ED Results / Procedures / Treatments   Labs (all labs ordered are listed, but only abnormal results are displayed) Labs Reviewed  COMPREHENSIVE METABOLIC PANEL - Abnormal; Notable for the following components:      Result Value   Potassium 3.3 (*)    Glucose, Bld 106 (*)    All other components within normal limits  CBC  URINALYSIS, ROUTINE W REFLEX MICROSCOPIC  CBG MONITORING, ED    EKG None  Radiology No results found.  Procedures Procedures  {Document cardiac monitor, telemetry assessment procedure when appropriate:1}  Medications Ordered in ED  Medications - No data to display  ED Course/ Medical Decision Making/ A&P   {   Click here for ABCD2, HEART and other calculatorsREFRESH Note before signing :1}                              Medical Decision Making Discussed with Dr. Dalene Seltzer - MRI had been ordered outpatient by Dr. Cliffton Asters. Given presentation today, will obtain study in the ED.   20:10 - Re-evaluation - sleeping, easy to awaken. Speech is improved, less delay in response to questions.   Amount and/or Complexity of Data Reviewed External Data Reviewed: labs, radiology and notes. Labs: ordered. Decision-making details documented in ED  Course. Radiology: ordered.   ***  {Document critical care time when appropriate:1} {Document review of labs and clinical decision tools ie heart score, Chads2Vasc2 etc:1}  {Document your independent review of radiology images, and any outside records:1} {Document your discussion with family members, caretakers, and with consultants:1} {Document social determinants of health affecting pt's care:1} {Document your decision making why or why not admission, treatments were needed:1} Final Clinical Impression(s) / ED Diagnoses Final diagnoses:  None    Rx / DC Orders ED Discharge Orders     None

## 2023-06-22 NOTE — ED Triage Notes (Signed)
Per [facility] pt coming from home- neighbor called and said patient is altered from her baseline today. Patient c/o dizziness and weakness.

## 2023-06-23 DIAGNOSIS — Z7401 Bed confinement status: Secondary | ICD-10-CM | POA: Diagnosis not present

## 2023-06-23 DIAGNOSIS — R531 Weakness: Secondary | ICD-10-CM | POA: Diagnosis not present

## 2023-06-23 MED ORDER — ARIPIPRAZOLE 10 MG PO TABS: 10.0000 mg | ORAL_TABLET | Freq: Every morning | ORAL | Status: DC

## 2023-06-23 MED ORDER — FUROSEMIDE 20 MG PO TABS: 40.0000 mg | ORAL_TABLET | Freq: Every day | ORAL | Status: DC

## 2023-06-23 MED ORDER — LORAZEPAM 0.5 MG PO TABS
0.5000 mg | ORAL_TABLET | Freq: Once | ORAL | Status: AC
  Administered 2023-06-23: 0.5 mg via ORAL
  Filled 2023-06-23: qty 1

## 2023-06-23 MED ORDER — DULOXETINE HCL 60 MG PO CPEP: 120.0000 mg | ORAL_CAPSULE | Freq: Every day | ORAL | Status: DC

## 2023-06-23 MED ORDER — QUETIAPINE FUMARATE 50 MG PO TABS: 150.0000 mg | ORAL_TABLET | Freq: Every day | ORAL | Status: DC

## 2023-06-23 MED ORDER — GABAPENTIN 300 MG PO CAPS: 600.0000 mg | ORAL_CAPSULE | Freq: Two times a day (BID) | ORAL | Status: DC

## 2023-06-23 MED ORDER — TRAZODONE HCL 100 MG PO TABS: 200.0000 mg | ORAL_TABLET | Freq: Every day | ORAL | Status: DC

## 2023-06-23 MED ORDER — PRAVASTATIN SODIUM 40 MG PO TABS: 40.0000 mg | ORAL_TABLET | Freq: Every day | ORAL | Status: DC

## 2023-06-23 MED ORDER — IRBESARTAN 300 MG PO TABS: 150.0000 mg | ORAL_TABLET | Freq: Every day | ORAL | Status: DC

## 2023-06-23 MED ORDER — LEVOTHYROXINE SODIUM 25 MCG PO TABS
25.0000 ug | ORAL_TABLET | Freq: Every day | ORAL | Status: DC
  Administered 2023-06-23: 25 ug via ORAL
  Filled 2023-06-23: qty 1

## 2023-06-23 NOTE — Discharge Instructions (Signed)
Your MRI study tonight is negative for stroke or other acute abnormality.   Follow up with Dr. Cliffton Asters as needed for ongoing management of weakness.   Return to the emergency department with any new or concerning symptoms.

## 2023-06-23 NOTE — ED Notes (Signed)
April(daughter) aware of [facility] transport present for pt discharge to home. Daughter voiced that pt's door to home is unlocked for pt. [facility] aware.

## 2023-06-23 NOTE — ED Provider Notes (Signed)
Emergency Medicine Observation Re-evaluation Note  [name] is a 77 y.o. female, seen on rounds today.  Pt initially presented to the ED for complaints of Weakness Currently, the patient is in bed.  Asking to go home.  Physical Exam  BP 118/66   Pulse 78   Temp 97.6 F (36.4 C) (Oral)   Resp 16   Ht 5\' 5"  (1.651 m)   Wt 75 kg   SpO2 97%   BMI 27.51 kg/m  Physical Exam General: no acute distress Lungs: normal effort Psych: no agitation  ED Course / MDM  EKG:EKG Interpretation Date/Time:  Wednesday June 22 2023 15:04:38 EDT Ventricular Rate:  78 PR Interval:  136 QRS Duration:  72 QT Interval:  352 QTC Calculation: 401 R Axis:   -33  Text Interpretation: Normal sinus rhythm Left axis deviation Nonspecific T wave abnormality Abnormal ECG When compared with ECG of 15-Jun-2023 08:04, No significant change since last tracing Confirmed by Alvira Monday ([id number]) on 06/22/2023 7:25:37 PM  I have reviewed the labs performed to date as well as medications administered while in observation.  Recent changes in the last 24 hours include home meds being ordered. Negative workup for stroke.[name]  Plan  Current plan is for discharge.  I talked to the daughter over the phone.  The patient does have some sundowning and some cognitive decline.  They are working on getting more help in the home and last night the door was locked so the patient could get in.  However at this point, doors unlocked and neighbors are CNA's who help take care of her and they are getting physical therapy and Occupational Therapy to help at home as well.  There is no acute medical emergency.  Vitals are normal.  Is stable for discharge.    Pricilla Loveless, MD 06/23/23 [phone]

## 2023-06-27 DIAGNOSIS — M5136 Other intervertebral disc degeneration, lumbar region: Secondary | ICD-10-CM | POA: Diagnosis not present

## 2023-06-27 DIAGNOSIS — I10 Essential (primary) hypertension: Secondary | ICD-10-CM | POA: Diagnosis not present

## 2023-06-27 DIAGNOSIS — M810 Age-related osteoporosis without current pathological fracture: Secondary | ICD-10-CM | POA: Diagnosis not present

## 2023-06-27 DIAGNOSIS — M2042 Other hammer toe(s) (acquired), left foot: Secondary | ICD-10-CM | POA: Diagnosis not present

## 2023-06-27 DIAGNOSIS — M2041 Other hammer toe(s) (acquired), right foot: Secondary | ICD-10-CM | POA: Diagnosis not present

## 2023-06-27 DIAGNOSIS — I700 Atherosclerosis of aorta: Secondary | ICD-10-CM | POA: Diagnosis not present

## 2023-06-27 DIAGNOSIS — F3341 Major depressive disorder, recurrent, in partial remission: Secondary | ICD-10-CM | POA: Diagnosis not present

## 2023-06-27 DIAGNOSIS — M48061 Spinal stenosis, lumbar region without neurogenic claudication: Secondary | ICD-10-CM | POA: Diagnosis not present

## 2023-06-27 DIAGNOSIS — F419 Anxiety disorder, unspecified: Secondary | ICD-10-CM | POA: Diagnosis not present

## 2023-06-28 DIAGNOSIS — M810 Age-related osteoporosis without current pathological fracture: Secondary | ICD-10-CM | POA: Diagnosis not present

## 2023-06-28 DIAGNOSIS — I700 Atherosclerosis of aorta: Secondary | ICD-10-CM | POA: Diagnosis not present

## 2023-06-28 DIAGNOSIS — F3341 Major depressive disorder, recurrent, in partial remission: Secondary | ICD-10-CM | POA: Diagnosis not present

## 2023-06-28 DIAGNOSIS — M2042 Other hammer toe(s) (acquired), left foot: Secondary | ICD-10-CM | POA: Diagnosis not present

## 2023-06-28 DIAGNOSIS — M5136 Other intervertebral disc degeneration, lumbar region: Secondary | ICD-10-CM | POA: Diagnosis not present

## 2023-06-28 DIAGNOSIS — M2041 Other hammer toe(s) (acquired), right foot: Secondary | ICD-10-CM | POA: Diagnosis not present

## 2023-06-28 DIAGNOSIS — F419 Anxiety disorder, unspecified: Secondary | ICD-10-CM | POA: Diagnosis not present

## 2023-06-28 DIAGNOSIS — I10 Essential (primary) hypertension: Secondary | ICD-10-CM | POA: Diagnosis not present

## 2023-06-28 DIAGNOSIS — M48061 Spinal stenosis, lumbar region without neurogenic claudication: Secondary | ICD-10-CM | POA: Diagnosis not present

## 2023-06-29 DIAGNOSIS — M2042 Other hammer toe(s) (acquired), left foot: Secondary | ICD-10-CM | POA: Diagnosis not present

## 2023-06-29 DIAGNOSIS — M2041 Other hammer toe(s) (acquired), right foot: Secondary | ICD-10-CM | POA: Diagnosis not present

## 2023-06-29 DIAGNOSIS — M5136 Other intervertebral disc degeneration, lumbar region: Secondary | ICD-10-CM | POA: Diagnosis not present

## 2023-06-29 DIAGNOSIS — M810 Age-related osteoporosis without current pathological fracture: Secondary | ICD-10-CM | POA: Diagnosis not present

## 2023-06-29 DIAGNOSIS — I10 Essential (primary) hypertension: Secondary | ICD-10-CM | POA: Diagnosis not present

## 2023-06-29 DIAGNOSIS — F3341 Major depressive disorder, recurrent, in partial remission: Secondary | ICD-10-CM | POA: Diagnosis not present

## 2023-06-29 DIAGNOSIS — M48061 Spinal stenosis, lumbar region without neurogenic claudication: Secondary | ICD-10-CM | POA: Diagnosis not present

## 2023-06-29 DIAGNOSIS — I700 Atherosclerosis of aorta: Secondary | ICD-10-CM | POA: Diagnosis not present

## 2023-06-29 DIAGNOSIS — F419 Anxiety disorder, unspecified: Secondary | ICD-10-CM | POA: Diagnosis not present

## 2023-06-30 DIAGNOSIS — M5136 Other intervertebral disc degeneration, lumbar region: Secondary | ICD-10-CM | POA: Diagnosis not present

## 2023-06-30 DIAGNOSIS — I700 Atherosclerosis of aorta: Secondary | ICD-10-CM | POA: Diagnosis not present

## 2023-06-30 DIAGNOSIS — M2041 Other hammer toe(s) (acquired), right foot: Secondary | ICD-10-CM | POA: Diagnosis not present

## 2023-06-30 DIAGNOSIS — M48061 Spinal stenosis, lumbar region without neurogenic claudication: Secondary | ICD-10-CM | POA: Diagnosis not present

## 2023-06-30 DIAGNOSIS — M810 Age-related osteoporosis without current pathological fracture: Secondary | ICD-10-CM | POA: Diagnosis not present

## 2023-06-30 DIAGNOSIS — M2042 Other hammer toe(s) (acquired), left foot: Secondary | ICD-10-CM | POA: Diagnosis not present

## 2023-06-30 DIAGNOSIS — I10 Essential (primary) hypertension: Secondary | ICD-10-CM | POA: Diagnosis not present

## 2023-06-30 DIAGNOSIS — F3341 Major depressive disorder, recurrent, in partial remission: Secondary | ICD-10-CM | POA: Diagnosis not present

## 2023-06-30 DIAGNOSIS — F419 Anxiety disorder, unspecified: Secondary | ICD-10-CM | POA: Diagnosis not present

## 2023-07-01 DIAGNOSIS — M5136 Other intervertebral disc degeneration, lumbar region: Secondary | ICD-10-CM | POA: Diagnosis not present

## 2023-07-01 DIAGNOSIS — M2042 Other hammer toe(s) (acquired), left foot: Secondary | ICD-10-CM | POA: Diagnosis not present

## 2023-07-01 DIAGNOSIS — M810 Age-related osteoporosis without current pathological fracture: Secondary | ICD-10-CM | POA: Diagnosis not present

## 2023-07-01 DIAGNOSIS — F3341 Major depressive disorder, recurrent, in partial remission: Secondary | ICD-10-CM | POA: Diagnosis not present

## 2023-07-01 DIAGNOSIS — M2041 Other hammer toe(s) (acquired), right foot: Secondary | ICD-10-CM | POA: Diagnosis not present

## 2023-07-01 DIAGNOSIS — I700 Atherosclerosis of aorta: Secondary | ICD-10-CM | POA: Diagnosis not present

## 2023-07-01 DIAGNOSIS — M48061 Spinal stenosis, lumbar region without neurogenic claudication: Secondary | ICD-10-CM | POA: Diagnosis not present

## 2023-07-01 DIAGNOSIS — F419 Anxiety disorder, unspecified: Secondary | ICD-10-CM | POA: Diagnosis not present

## 2023-07-01 DIAGNOSIS — I10 Essential (primary) hypertension: Secondary | ICD-10-CM | POA: Diagnosis not present

## 2023-07-04 DIAGNOSIS — M5136 Other intervertebral disc degeneration, lumbar region: Secondary | ICD-10-CM | POA: Diagnosis not present

## 2023-07-04 DIAGNOSIS — I10 Essential (primary) hypertension: Secondary | ICD-10-CM | POA: Diagnosis not present

## 2023-07-04 DIAGNOSIS — M48061 Spinal stenosis, lumbar region without neurogenic claudication: Secondary | ICD-10-CM | POA: Diagnosis not present

## 2023-07-04 DIAGNOSIS — M2042 Other hammer toe(s) (acquired), left foot: Secondary | ICD-10-CM | POA: Diagnosis not present

## 2023-07-04 DIAGNOSIS — M810 Age-related osteoporosis without current pathological fracture: Secondary | ICD-10-CM | POA: Diagnosis not present

## 2023-07-04 DIAGNOSIS — F3341 Major depressive disorder, recurrent, in partial remission: Secondary | ICD-10-CM | POA: Diagnosis not present

## 2023-07-04 DIAGNOSIS — F419 Anxiety disorder, unspecified: Secondary | ICD-10-CM | POA: Diagnosis not present

## 2023-07-04 DIAGNOSIS — I700 Atherosclerosis of aorta: Secondary | ICD-10-CM | POA: Diagnosis not present

## 2023-07-04 DIAGNOSIS — M2041 Other hammer toe(s) (acquired), right foot: Secondary | ICD-10-CM | POA: Diagnosis not present

## 2023-07-05 DIAGNOSIS — F3341 Major depressive disorder, recurrent, in partial remission: Secondary | ICD-10-CM | POA: Diagnosis not present

## 2023-07-05 DIAGNOSIS — M2042 Other hammer toe(s) (acquired), left foot: Secondary | ICD-10-CM | POA: Diagnosis not present

## 2023-07-05 DIAGNOSIS — M5136 Other intervertebral disc degeneration, lumbar region: Secondary | ICD-10-CM | POA: Diagnosis not present

## 2023-07-05 DIAGNOSIS — M810 Age-related osteoporosis without current pathological fracture: Secondary | ICD-10-CM | POA: Diagnosis not present

## 2023-07-05 DIAGNOSIS — I700 Atherosclerosis of aorta: Secondary | ICD-10-CM | POA: Diagnosis not present

## 2023-07-05 DIAGNOSIS — M2041 Other hammer toe(s) (acquired), right foot: Secondary | ICD-10-CM | POA: Diagnosis not present

## 2023-07-05 DIAGNOSIS — F419 Anxiety disorder, unspecified: Secondary | ICD-10-CM | POA: Diagnosis not present

## 2023-07-05 DIAGNOSIS — M48061 Spinal stenosis, lumbar region without neurogenic claudication: Secondary | ICD-10-CM | POA: Diagnosis not present

## 2023-07-05 DIAGNOSIS — I10 Essential (primary) hypertension: Secondary | ICD-10-CM | POA: Diagnosis not present

## 2023-07-06 DIAGNOSIS — I700 Atherosclerosis of aorta: Secondary | ICD-10-CM | POA: Diagnosis not present

## 2023-07-06 DIAGNOSIS — M5136 Other intervertebral disc degeneration, lumbar region: Secondary | ICD-10-CM | POA: Diagnosis not present

## 2023-07-06 DIAGNOSIS — F419 Anxiety disorder, unspecified: Secondary | ICD-10-CM | POA: Diagnosis not present

## 2023-07-06 DIAGNOSIS — I10 Essential (primary) hypertension: Secondary | ICD-10-CM | POA: Diagnosis not present

## 2023-07-06 DIAGNOSIS — M2042 Other hammer toe(s) (acquired), left foot: Secondary | ICD-10-CM | POA: Diagnosis not present

## 2023-07-06 DIAGNOSIS — M810 Age-related osteoporosis without current pathological fracture: Secondary | ICD-10-CM | POA: Diagnosis not present

## 2023-07-06 DIAGNOSIS — M48061 Spinal stenosis, lumbar region without neurogenic claudication: Secondary | ICD-10-CM | POA: Diagnosis not present

## 2023-07-06 DIAGNOSIS — F3341 Major depressive disorder, recurrent, in partial remission: Secondary | ICD-10-CM | POA: Diagnosis not present

## 2023-07-06 DIAGNOSIS — M2041 Other hammer toe(s) (acquired), right foot: Secondary | ICD-10-CM | POA: Diagnosis not present

## 2023-07-07 DIAGNOSIS — F419 Anxiety disorder, unspecified: Secondary | ICD-10-CM | POA: Diagnosis not present

## 2023-07-07 DIAGNOSIS — M810 Age-related osteoporosis without current pathological fracture: Secondary | ICD-10-CM | POA: Diagnosis not present

## 2023-07-07 DIAGNOSIS — M2041 Other hammer toe(s) (acquired), right foot: Secondary | ICD-10-CM | POA: Diagnosis not present

## 2023-07-07 DIAGNOSIS — F3341 Major depressive disorder, recurrent, in partial remission: Secondary | ICD-10-CM | POA: Diagnosis not present

## 2023-07-07 DIAGNOSIS — I10 Essential (primary) hypertension: Secondary | ICD-10-CM | POA: Diagnosis not present

## 2023-07-07 DIAGNOSIS — M48061 Spinal stenosis, lumbar region without neurogenic claudication: Secondary | ICD-10-CM | POA: Diagnosis not present

## 2023-07-07 DIAGNOSIS — M2042 Other hammer toe(s) (acquired), left foot: Secondary | ICD-10-CM | POA: Diagnosis not present

## 2023-07-07 DIAGNOSIS — M5136 Other intervertebral disc degeneration, lumbar region: Secondary | ICD-10-CM | POA: Diagnosis not present

## 2023-07-07 DIAGNOSIS — I700 Atherosclerosis of aorta: Secondary | ICD-10-CM | POA: Diagnosis not present

## 2023-07-11 DIAGNOSIS — I10 Essential (primary) hypertension: Secondary | ICD-10-CM | POA: Diagnosis not present

## 2023-07-11 DIAGNOSIS — I700 Atherosclerosis of aorta: Secondary | ICD-10-CM | POA: Diagnosis not present

## 2023-07-11 DIAGNOSIS — M2042 Other hammer toe(s) (acquired), left foot: Secondary | ICD-10-CM | POA: Diagnosis not present

## 2023-07-11 DIAGNOSIS — F419 Anxiety disorder, unspecified: Secondary | ICD-10-CM | POA: Diagnosis not present

## 2023-07-11 DIAGNOSIS — F3341 Major depressive disorder, recurrent, in partial remission: Secondary | ICD-10-CM | POA: Diagnosis not present

## 2023-07-11 DIAGNOSIS — M810 Age-related osteoporosis without current pathological fracture: Secondary | ICD-10-CM | POA: Diagnosis not present

## 2023-07-11 DIAGNOSIS — M5136 Other intervertebral disc degeneration, lumbar region: Secondary | ICD-10-CM | POA: Diagnosis not present

## 2023-07-11 DIAGNOSIS — M48061 Spinal stenosis, lumbar region without neurogenic claudication: Secondary | ICD-10-CM | POA: Diagnosis not present

## 2023-07-11 DIAGNOSIS — M2041 Other hammer toe(s) (acquired), right foot: Secondary | ICD-10-CM | POA: Diagnosis not present

## 2023-07-13 DIAGNOSIS — M5136 Other intervertebral disc degeneration, lumbar region: Secondary | ICD-10-CM | POA: Diagnosis not present

## 2023-07-13 DIAGNOSIS — M810 Age-related osteoporosis without current pathological fracture: Secondary | ICD-10-CM | POA: Diagnosis not present

## 2023-07-13 DIAGNOSIS — M2041 Other hammer toe(s) (acquired), right foot: Secondary | ICD-10-CM | POA: Diagnosis not present

## 2023-07-13 DIAGNOSIS — M2042 Other hammer toe(s) (acquired), left foot: Secondary | ICD-10-CM | POA: Diagnosis not present

## 2023-07-13 DIAGNOSIS — I700 Atherosclerosis of aorta: Secondary | ICD-10-CM | POA: Diagnosis not present

## 2023-07-13 DIAGNOSIS — M48061 Spinal stenosis, lumbar region without neurogenic claudication: Secondary | ICD-10-CM | POA: Diagnosis not present

## 2023-07-13 DIAGNOSIS — I10 Essential (primary) hypertension: Secondary | ICD-10-CM | POA: Diagnosis not present

## 2023-07-13 DIAGNOSIS — F419 Anxiety disorder, unspecified: Secondary | ICD-10-CM | POA: Diagnosis not present

## 2023-07-13 DIAGNOSIS — F3341 Major depressive disorder, recurrent, in partial remission: Secondary | ICD-10-CM | POA: Diagnosis not present

## 2023-07-14 DIAGNOSIS — M48061 Spinal stenosis, lumbar region without neurogenic claudication: Secondary | ICD-10-CM | POA: Diagnosis not present

## 2023-07-14 DIAGNOSIS — M810 Age-related osteoporosis without current pathological fracture: Secondary | ICD-10-CM | POA: Diagnosis not present

## 2023-07-14 DIAGNOSIS — I700 Atherosclerosis of aorta: Secondary | ICD-10-CM | POA: Diagnosis not present

## 2023-07-14 DIAGNOSIS — F3341 Major depressive disorder, recurrent, in partial remission: Secondary | ICD-10-CM | POA: Diagnosis not present

## 2023-07-14 DIAGNOSIS — M2042 Other hammer toe(s) (acquired), left foot: Secondary | ICD-10-CM | POA: Diagnosis not present

## 2023-07-14 DIAGNOSIS — F419 Anxiety disorder, unspecified: Secondary | ICD-10-CM | POA: Diagnosis not present

## 2023-07-14 DIAGNOSIS — M5136 Other intervertebral disc degeneration, lumbar region: Secondary | ICD-10-CM | POA: Diagnosis not present

## 2023-07-14 DIAGNOSIS — M2041 Other hammer toe(s) (acquired), right foot: Secondary | ICD-10-CM | POA: Diagnosis not present

## 2023-07-14 DIAGNOSIS — I10 Essential (primary) hypertension: Secondary | ICD-10-CM | POA: Diagnosis not present

## 2023-07-15 DIAGNOSIS — I700 Atherosclerosis of aorta: Secondary | ICD-10-CM | POA: Diagnosis not present

## 2023-07-15 DIAGNOSIS — M810 Age-related osteoporosis without current pathological fracture: Secondary | ICD-10-CM | POA: Diagnosis not present

## 2023-07-15 DIAGNOSIS — F419 Anxiety disorder, unspecified: Secondary | ICD-10-CM | POA: Diagnosis not present

## 2023-07-15 DIAGNOSIS — M2042 Other hammer toe(s) (acquired), left foot: Secondary | ICD-10-CM | POA: Diagnosis not present

## 2023-07-15 DIAGNOSIS — M5136 Other intervertebral disc degeneration, lumbar region: Secondary | ICD-10-CM | POA: Diagnosis not present

## 2023-07-15 DIAGNOSIS — M2041 Other hammer toe(s) (acquired), right foot: Secondary | ICD-10-CM | POA: Diagnosis not present

## 2023-07-15 DIAGNOSIS — F3341 Major depressive disorder, recurrent, in partial remission: Secondary | ICD-10-CM | POA: Diagnosis not present

## 2023-07-15 DIAGNOSIS — I10 Essential (primary) hypertension: Secondary | ICD-10-CM | POA: Diagnosis not present

## 2023-07-15 DIAGNOSIS — M48061 Spinal stenosis, lumbar region without neurogenic claudication: Secondary | ICD-10-CM | POA: Diagnosis not present

## 2023-07-18 DIAGNOSIS — I700 Atherosclerosis of aorta: Secondary | ICD-10-CM | POA: Diagnosis not present

## 2023-07-18 DIAGNOSIS — M810 Age-related osteoporosis without current pathological fracture: Secondary | ICD-10-CM | POA: Diagnosis not present

## 2023-07-18 DIAGNOSIS — M48061 Spinal stenosis, lumbar region without neurogenic claudication: Secondary | ICD-10-CM | POA: Diagnosis not present

## 2023-07-18 DIAGNOSIS — F3341 Major depressive disorder, recurrent, in partial remission: Secondary | ICD-10-CM | POA: Diagnosis not present

## 2023-07-18 DIAGNOSIS — M2042 Other hammer toe(s) (acquired), left foot: Secondary | ICD-10-CM | POA: Diagnosis not present

## 2023-07-18 DIAGNOSIS — I10 Essential (primary) hypertension: Secondary | ICD-10-CM | POA: Diagnosis not present

## 2023-07-18 DIAGNOSIS — F419 Anxiety disorder, unspecified: Secondary | ICD-10-CM | POA: Diagnosis not present

## 2023-07-18 DIAGNOSIS — M2041 Other hammer toe(s) (acquired), right foot: Secondary | ICD-10-CM | POA: Diagnosis not present

## 2023-07-18 DIAGNOSIS — M5136 Other intervertebral disc degeneration, lumbar region: Secondary | ICD-10-CM | POA: Diagnosis not present

## 2023-07-19 DIAGNOSIS — M2042 Other hammer toe(s) (acquired), left foot: Secondary | ICD-10-CM | POA: Diagnosis not present

## 2023-07-19 DIAGNOSIS — M48061 Spinal stenosis, lumbar region without neurogenic claudication: Secondary | ICD-10-CM | POA: Diagnosis not present

## 2023-07-19 DIAGNOSIS — M2041 Other hammer toe(s) (acquired), right foot: Secondary | ICD-10-CM | POA: Diagnosis not present

## 2023-07-19 DIAGNOSIS — M5136 Other intervertebral disc degeneration, lumbar region: Secondary | ICD-10-CM | POA: Diagnosis not present

## 2023-07-19 DIAGNOSIS — F419 Anxiety disorder, unspecified: Secondary | ICD-10-CM | POA: Diagnosis not present

## 2023-07-19 DIAGNOSIS — I700 Atherosclerosis of aorta: Secondary | ICD-10-CM | POA: Diagnosis not present

## 2023-07-19 DIAGNOSIS — M810 Age-related osteoporosis without current pathological fracture: Secondary | ICD-10-CM | POA: Diagnosis not present

## 2023-07-19 DIAGNOSIS — F3341 Major depressive disorder, recurrent, in partial remission: Secondary | ICD-10-CM | POA: Diagnosis not present

## 2023-07-19 DIAGNOSIS — I10 Essential (primary) hypertension: Secondary | ICD-10-CM | POA: Diagnosis not present

## 2023-07-26 DIAGNOSIS — M48061 Spinal stenosis, lumbar region without neurogenic claudication: Secondary | ICD-10-CM | POA: Diagnosis not present

## 2023-07-26 DIAGNOSIS — M810 Age-related osteoporosis without current pathological fracture: Secondary | ICD-10-CM | POA: Diagnosis not present

## 2023-07-26 DIAGNOSIS — F3341 Major depressive disorder, recurrent, in partial remission: Secondary | ICD-10-CM | POA: Diagnosis not present

## 2023-07-26 DIAGNOSIS — M51369 Other intervertebral disc degeneration, lumbar region without mention of lumbar back pain or lower extremity pain: Secondary | ICD-10-CM | POA: Diagnosis not present

## 2023-07-26 DIAGNOSIS — M2042 Other hammer toe(s) (acquired), left foot: Secondary | ICD-10-CM | POA: Diagnosis not present

## 2023-07-26 DIAGNOSIS — M2041 Other hammer toe(s) (acquired), right foot: Secondary | ICD-10-CM | POA: Diagnosis not present

## 2023-07-26 DIAGNOSIS — I700 Atherosclerosis of aorta: Secondary | ICD-10-CM | POA: Diagnosis not present

## 2023-07-26 DIAGNOSIS — F419 Anxiety disorder, unspecified: Secondary | ICD-10-CM | POA: Diagnosis not present

## 2023-07-26 DIAGNOSIS — I10 Essential (primary) hypertension: Secondary | ICD-10-CM | POA: Diagnosis not present

## 2023-07-27 DIAGNOSIS — M2041 Other hammer toe(s) (acquired), right foot: Secondary | ICD-10-CM | POA: Diagnosis not present

## 2023-07-27 DIAGNOSIS — M810 Age-related osteoporosis without current pathological fracture: Secondary | ICD-10-CM | POA: Diagnosis not present

## 2023-07-27 DIAGNOSIS — I700 Atherosclerosis of aorta: Secondary | ICD-10-CM | POA: Diagnosis not present

## 2023-07-27 DIAGNOSIS — F3341 Major depressive disorder, recurrent, in partial remission: Secondary | ICD-10-CM | POA: Diagnosis not present

## 2023-07-27 DIAGNOSIS — M51369 Other intervertebral disc degeneration, lumbar region without mention of lumbar back pain or lower extremity pain: Secondary | ICD-10-CM | POA: Diagnosis not present

## 2023-07-27 DIAGNOSIS — M48061 Spinal stenosis, lumbar region without neurogenic claudication: Secondary | ICD-10-CM | POA: Diagnosis not present

## 2023-07-27 DIAGNOSIS — M2042 Other hammer toe(s) (acquired), left foot: Secondary | ICD-10-CM | POA: Diagnosis not present

## 2023-07-27 DIAGNOSIS — F419 Anxiety disorder, unspecified: Secondary | ICD-10-CM | POA: Diagnosis not present

## 2023-07-27 DIAGNOSIS — I10 Essential (primary) hypertension: Secondary | ICD-10-CM | POA: Diagnosis not present

## 2023-07-28 DIAGNOSIS — M2041 Other hammer toe(s) (acquired), right foot: Secondary | ICD-10-CM | POA: Diagnosis not present

## 2023-07-28 DIAGNOSIS — M2042 Other hammer toe(s) (acquired), left foot: Secondary | ICD-10-CM | POA: Diagnosis not present

## 2023-07-28 DIAGNOSIS — M51369 Other intervertebral disc degeneration, lumbar region without mention of lumbar back pain or lower extremity pain: Secondary | ICD-10-CM | POA: Diagnosis not present

## 2023-07-28 DIAGNOSIS — I700 Atherosclerosis of aorta: Secondary | ICD-10-CM | POA: Diagnosis not present

## 2023-07-28 DIAGNOSIS — I10 Essential (primary) hypertension: Secondary | ICD-10-CM | POA: Diagnosis not present

## 2023-07-28 DIAGNOSIS — F419 Anxiety disorder, unspecified: Secondary | ICD-10-CM | POA: Diagnosis not present

## 2023-07-28 DIAGNOSIS — M810 Age-related osteoporosis without current pathological fracture: Secondary | ICD-10-CM | POA: Diagnosis not present

## 2023-07-28 DIAGNOSIS — F3341 Major depressive disorder, recurrent, in partial remission: Secondary | ICD-10-CM | POA: Diagnosis not present

## 2023-07-28 DIAGNOSIS — M48061 Spinal stenosis, lumbar region without neurogenic claudication: Secondary | ICD-10-CM | POA: Diagnosis not present

## 2023-08-01 DIAGNOSIS — I700 Atherosclerosis of aorta: Secondary | ICD-10-CM | POA: Diagnosis not present

## 2023-08-01 DIAGNOSIS — M2041 Other hammer toe(s) (acquired), right foot: Secondary | ICD-10-CM | POA: Diagnosis not present

## 2023-08-01 DIAGNOSIS — M810 Age-related osteoporosis without current pathological fracture: Secondary | ICD-10-CM | POA: Diagnosis not present

## 2023-08-01 DIAGNOSIS — M48061 Spinal stenosis, lumbar region without neurogenic claudication: Secondary | ICD-10-CM | POA: Diagnosis not present

## 2023-08-01 DIAGNOSIS — M51369 Other intervertebral disc degeneration, lumbar region without mention of lumbar back pain or lower extremity pain: Secondary | ICD-10-CM | POA: Diagnosis not present

## 2023-08-01 DIAGNOSIS — I10 Essential (primary) hypertension: Secondary | ICD-10-CM | POA: Diagnosis not present

## 2023-08-01 DIAGNOSIS — F419 Anxiety disorder, unspecified: Secondary | ICD-10-CM | POA: Diagnosis not present

## 2023-08-01 DIAGNOSIS — M2042 Other hammer toe(s) (acquired), left foot: Secondary | ICD-10-CM | POA: Diagnosis not present

## 2023-08-01 DIAGNOSIS — F3341 Major depressive disorder, recurrent, in partial remission: Secondary | ICD-10-CM | POA: Diagnosis not present

## 2023-08-02 DIAGNOSIS — I700 Atherosclerosis of aorta: Secondary | ICD-10-CM | POA: Diagnosis not present

## 2023-08-02 DIAGNOSIS — M48061 Spinal stenosis, lumbar region without neurogenic claudication: Secondary | ICD-10-CM | POA: Diagnosis not present

## 2023-08-02 DIAGNOSIS — M2041 Other hammer toe(s) (acquired), right foot: Secondary | ICD-10-CM | POA: Diagnosis not present

## 2023-08-02 DIAGNOSIS — I10 Essential (primary) hypertension: Secondary | ICD-10-CM | POA: Diagnosis not present

## 2023-08-02 DIAGNOSIS — M2042 Other hammer toe(s) (acquired), left foot: Secondary | ICD-10-CM | POA: Diagnosis not present

## 2023-08-02 DIAGNOSIS — M810 Age-related osteoporosis without current pathological fracture: Secondary | ICD-10-CM | POA: Diagnosis not present

## 2023-08-02 DIAGNOSIS — M51369 Other intervertebral disc degeneration, lumbar region without mention of lumbar back pain or lower extremity pain: Secondary | ICD-10-CM | POA: Diagnosis not present

## 2023-08-02 DIAGNOSIS — F3341 Major depressive disorder, recurrent, in partial remission: Secondary | ICD-10-CM | POA: Diagnosis not present

## 2023-08-02 DIAGNOSIS — F419 Anxiety disorder, unspecified: Secondary | ICD-10-CM | POA: Diagnosis not present

## 2023-08-03 DIAGNOSIS — M48061 Spinal stenosis, lumbar region without neurogenic claudication: Secondary | ICD-10-CM | POA: Diagnosis not present

## 2023-08-03 DIAGNOSIS — F3341 Major depressive disorder, recurrent, in partial remission: Secondary | ICD-10-CM | POA: Diagnosis not present

## 2023-08-03 DIAGNOSIS — M2041 Other hammer toe(s) (acquired), right foot: Secondary | ICD-10-CM | POA: Diagnosis not present

## 2023-08-03 DIAGNOSIS — M810 Age-related osteoporosis without current pathological fracture: Secondary | ICD-10-CM | POA: Diagnosis not present

## 2023-08-03 DIAGNOSIS — I10 Essential (primary) hypertension: Secondary | ICD-10-CM | POA: Diagnosis not present

## 2023-08-03 DIAGNOSIS — F419 Anxiety disorder, unspecified: Secondary | ICD-10-CM | POA: Diagnosis not present

## 2023-08-03 DIAGNOSIS — I700 Atherosclerosis of aorta: Secondary | ICD-10-CM | POA: Diagnosis not present

## 2023-08-03 DIAGNOSIS — M51369 Other intervertebral disc degeneration, lumbar region without mention of lumbar back pain or lower extremity pain: Secondary | ICD-10-CM | POA: Diagnosis not present

## 2023-08-03 DIAGNOSIS — M2042 Other hammer toe(s) (acquired), left foot: Secondary | ICD-10-CM | POA: Diagnosis not present

## 2023-08-04 DIAGNOSIS — M810 Age-related osteoporosis without current pathological fracture: Secondary | ICD-10-CM | POA: Diagnosis not present

## 2023-08-04 DIAGNOSIS — M2042 Other hammer toe(s) (acquired), left foot: Secondary | ICD-10-CM | POA: Diagnosis not present

## 2023-08-04 DIAGNOSIS — M2041 Other hammer toe(s) (acquired), right foot: Secondary | ICD-10-CM | POA: Diagnosis not present

## 2023-08-04 DIAGNOSIS — M51369 Other intervertebral disc degeneration, lumbar region without mention of lumbar back pain or lower extremity pain: Secondary | ICD-10-CM | POA: Diagnosis not present

## 2023-08-04 DIAGNOSIS — I700 Atherosclerosis of aorta: Secondary | ICD-10-CM | POA: Diagnosis not present

## 2023-08-04 DIAGNOSIS — M48061 Spinal stenosis, lumbar region without neurogenic claudication: Secondary | ICD-10-CM | POA: Diagnosis not present

## 2023-08-04 DIAGNOSIS — F419 Anxiety disorder, unspecified: Secondary | ICD-10-CM | POA: Diagnosis not present

## 2023-08-04 DIAGNOSIS — I10 Essential (primary) hypertension: Secondary | ICD-10-CM | POA: Diagnosis not present

## 2023-08-04 DIAGNOSIS — F3341 Major depressive disorder, recurrent, in partial remission: Secondary | ICD-10-CM | POA: Diagnosis not present

## 2023-08-10 DIAGNOSIS — F419 Anxiety disorder, unspecified: Secondary | ICD-10-CM | POA: Diagnosis not present

## 2023-08-10 DIAGNOSIS — I700 Atherosclerosis of aorta: Secondary | ICD-10-CM | POA: Diagnosis not present

## 2023-08-10 DIAGNOSIS — M48061 Spinal stenosis, lumbar region without neurogenic claudication: Secondary | ICD-10-CM | POA: Diagnosis not present

## 2023-08-10 DIAGNOSIS — M2042 Other hammer toe(s) (acquired), left foot: Secondary | ICD-10-CM | POA: Diagnosis not present

## 2023-08-10 DIAGNOSIS — M51369 Other intervertebral disc degeneration, lumbar region without mention of lumbar back pain or lower extremity pain: Secondary | ICD-10-CM | POA: Diagnosis not present

## 2023-08-10 DIAGNOSIS — M810 Age-related osteoporosis without current pathological fracture: Secondary | ICD-10-CM | POA: Diagnosis not present

## 2023-08-10 DIAGNOSIS — I10 Essential (primary) hypertension: Secondary | ICD-10-CM | POA: Diagnosis not present

## 2023-08-10 DIAGNOSIS — F3341 Major depressive disorder, recurrent, in partial remission: Secondary | ICD-10-CM | POA: Diagnosis not present

## 2023-08-10 DIAGNOSIS — M2041 Other hammer toe(s) (acquired), right foot: Secondary | ICD-10-CM | POA: Diagnosis not present

## 2023-08-16 DIAGNOSIS — M48061 Spinal stenosis, lumbar region without neurogenic claudication: Secondary | ICD-10-CM | POA: Diagnosis not present

## 2023-08-16 DIAGNOSIS — M810 Age-related osteoporosis without current pathological fracture: Secondary | ICD-10-CM | POA: Diagnosis not present

## 2023-08-16 DIAGNOSIS — F419 Anxiety disorder, unspecified: Secondary | ICD-10-CM | POA: Diagnosis not present

## 2023-08-16 DIAGNOSIS — F411 Generalized anxiety disorder: Secondary | ICD-10-CM | POA: Diagnosis not present

## 2023-08-16 DIAGNOSIS — F331 Major depressive disorder, recurrent, moderate: Secondary | ICD-10-CM | POA: Diagnosis not present

## 2023-08-16 DIAGNOSIS — I700 Atherosclerosis of aorta: Secondary | ICD-10-CM | POA: Diagnosis not present

## 2023-08-16 DIAGNOSIS — F3341 Major depressive disorder, recurrent, in partial remission: Secondary | ICD-10-CM | POA: Diagnosis not present

## 2023-08-16 DIAGNOSIS — I10 Essential (primary) hypertension: Secondary | ICD-10-CM | POA: Diagnosis not present

## 2023-08-16 DIAGNOSIS — M2042 Other hammer toe(s) (acquired), left foot: Secondary | ICD-10-CM | POA: Diagnosis not present

## 2023-08-16 DIAGNOSIS — M51369 Other intervertebral disc degeneration, lumbar region without mention of lumbar back pain or lower extremity pain: Secondary | ICD-10-CM | POA: Diagnosis not present

## 2023-08-16 DIAGNOSIS — M2041 Other hammer toe(s) (acquired), right foot: Secondary | ICD-10-CM | POA: Diagnosis not present

## 2023-08-17 ENCOUNTER — Other Ambulatory Visit (INDEPENDENT_AMBULATORY_CARE_PROVIDER_SITE_OTHER): Payer: Medicare PPO

## 2023-08-17 ENCOUNTER — Encounter: Payer: Self-pay | Admitting: Orthopedic Surgery

## 2023-08-17 ENCOUNTER — Ambulatory Visit: Payer: Medicare PPO | Admitting: Orthopedic Surgery

## 2023-08-17 DIAGNOSIS — G8929 Other chronic pain: Secondary | ICD-10-CM

## 2023-08-17 DIAGNOSIS — M1712 Unilateral primary osteoarthritis, left knee: Secondary | ICD-10-CM | POA: Diagnosis not present

## 2023-08-17 DIAGNOSIS — M810 Age-related osteoporosis without current pathological fracture: Secondary | ICD-10-CM | POA: Diagnosis not present

## 2023-08-17 DIAGNOSIS — Z96651 Presence of right artificial knee joint: Secondary | ICD-10-CM | POA: Diagnosis not present

## 2023-08-17 DIAGNOSIS — M51369 Other intervertebral disc degeneration, lumbar region without mention of lumbar back pain or lower extremity pain: Secondary | ICD-10-CM | POA: Diagnosis not present

## 2023-08-17 DIAGNOSIS — M25562 Pain in left knee: Secondary | ICD-10-CM

## 2023-08-17 DIAGNOSIS — M2042 Other hammer toe(s) (acquired), left foot: Secondary | ICD-10-CM | POA: Diagnosis not present

## 2023-08-17 DIAGNOSIS — F3341 Major depressive disorder, recurrent, in partial remission: Secondary | ICD-10-CM | POA: Diagnosis not present

## 2023-08-17 DIAGNOSIS — M48061 Spinal stenosis, lumbar region without neurogenic claudication: Secondary | ICD-10-CM | POA: Diagnosis not present

## 2023-08-17 DIAGNOSIS — I10 Essential (primary) hypertension: Secondary | ICD-10-CM | POA: Diagnosis not present

## 2023-08-17 DIAGNOSIS — I700 Atherosclerosis of aorta: Secondary | ICD-10-CM | POA: Diagnosis not present

## 2023-08-17 DIAGNOSIS — F419 Anxiety disorder, unspecified: Secondary | ICD-10-CM | POA: Diagnosis not present

## 2023-08-17 DIAGNOSIS — M2041 Other hammer toe(s) (acquired), right foot: Secondary | ICD-10-CM | POA: Diagnosis not present

## 2023-08-17 MED ORDER — BUPIVACAINE HCL 0.25 % IJ SOLN
4.0000 mL | INTRAMUSCULAR | Status: AC | PRN
Start: 2023-08-17 — End: 2023-08-17
  Administered 2023-08-17: 4 mL via INTRA_ARTICULAR

## 2023-08-17 MED ORDER — LIDOCAINE HCL 1 % IJ SOLN
5.0000 mL | INTRAMUSCULAR | Status: AC | PRN
Start: 2023-08-17 — End: 2023-08-17
  Administered 2023-08-17: 5 mL

## 2023-08-17 MED ORDER — METHYLPREDNISOLONE ACETATE 40 MG/ML IJ SUSP
40.0000 mg | INTRAMUSCULAR | Status: AC | PRN
Start: 2023-08-17 — End: 2023-08-17
  Administered 2023-08-17: 40 mg via INTRA_ARTICULAR

## 2023-08-17 NOTE — Progress Notes (Signed)
Office Visit Note   [name]           Date of Birth: May 09, 1946           MRN: [id number] Visit Date: 08/17/2023 Requested by: Laurann Montana, MD [phone] [address] [place],  [place] [address] PCP: Laurann Montana, MD  Subjective: Chief Complaint  Patient presents with   Right Knee - Pain   Left Knee - Pain    HPI: [name] is a 77 y.o. female who presents to the office reporting bilateral knee pain.  She does use a cane.  Had right total knee replacement 06/09/2021.  Does not drive anymore but does have a dog at home that she walks.  Describes "fever" in both knees.  Worse on the right-hand side.  Denies any history of fall or injury.  Takes Tylenol for symptoms..                ROS: All systems reviewed are negative as they relate to the chief complaint within the history of present illness.  Patient denies fevers or chills.  Assessment & Plan: Visit Diagnoses:  1. History of total knee arthroplasty, right   2. Chronic pain of left knee     Plan: Impression is no structural problem identified in the right knee.  Has reasonable extensor mechanism function and no effusion.  Good stability.  Radiographs look intact.  Open of ossification of the inferior pole of the patella.  Extensor mechanism though is functional.  Left knee may have some early mild arthritis.  Left knee is injected today and I think that may help get the left knee more functional.  She will follow-up as needed.  Follow-Up Instructions: No follow-ups on file.   Orders:  Orders Placed This Encounter  Procedures   XR KNEE 3 VIEW RIGHT   XR KNEE 3 VIEW LEFT   No orders of the defined types were placed in this encounter.     Procedures: Large Joint Inj: L knee on 08/17/2023 9:57 AM Indications: diagnostic evaluation, joint swelling and pain Details: 18 G 1.5 in needle, superolateral approach  Arthrogram: No  Medications: 5 mL lidocaine 1 %; 40 mg methylPREDNISolone acetate 40  MG/ML; 4 mL bupivacaine 0.25 % Outcome: tolerated well, no immediate complications Procedure, treatment alternatives, risks and benefits explained, specific risks discussed. Consent was given by the patient. Immediately prior to procedure a time out was called to verify the correct patient, procedure, equipment, support staff and site/side marked as required. Patient was prepped and draped in the usual sterile fashion.       Clinical Data: No additional findings.  Objective: Vital Signs: There were no vitals taken for this visit.  Physical Exam:  Constitutional: Patient appears well-developed HEENT:  Head: Normocephalic Eyes:EOM are normal Neck: Normal range of motion Cardiovascular: Normal rate Pulmonary/chest: Effort normal Neurologic: Patient is alert Skin: Skin is warm Psychiatric: Patient has normal mood and affect  Ortho Exam: Ortho exam demonstrates range of motion both knees of 0-1 20 on the left and 5-1 15 on the right.  5 degree extensor lag on the right no extensor lag on the left.  No effusion or warmth in either knee.  Collaterals are stable to varus valgus stress at 0 and 30 degrees.  Does have some venous stasis changes on both the left and right leg.  Does have significant hallux valgus and crossover toes in both feet.  Specialty Comments:  No specialty comments  available.  Imaging: XR KNEE 3 VIEW RIGHT  Result Date: 08/17/2023 AP lateral merchant radiographs right knee reviewed.  Total knee prosthesis in good position alignment.  Some ossicles noted off the inferior pole of the patella relatively unchanged from prior radiographs.  No acute fracture.  Alignment intact.  Bone cement interface intact  XR KNEE 3 VIEW LEFT  Result Date: 08/17/2023 AP lateral merchant radiographs left knee reviewed.  Mild degenerative joint disease is present in the medial lateral patellofemoral compartments without fracture or osteophyte formation.  Joint spaces maintained.     PMFS History: Patient Active Problem List   Diagnosis Date Noted   E coli bacteremia    Acute lower UTI    Sepsis due to undetermined organism (HCC) 09/19/2021   Hypokalemia 09/19/2021   Leukopenia 09/19/2021   Prolonged QT interval 09/19/2021   Arthritis of right knee    UTI (urinary tract infection) 06/26/2021   Severe sepsis (HCC) 06/25/2021   Acute metabolic encephalopathy 06/25/2021   Hypothyroid 06/25/2021   S/P total knee arthroplasty, right 06/09/2021   Fever    Staphylococcus aureus bacteremia with sepsis (HCC)    SIRS (systemic inflammatory response syndrome) (HCC) 11/30/2020   Lumbar radiculopathy 10/10/2019   Bilateral stenosis of lateral recess of lumbar spine 10/10/2019   Hemorrhoids, external without complications 06/17/2014   Depression    Anxiety    Rectal bleeding    History of radiation therapy    Malignant neoplasm of anal canal (HCC) 11/15/2011   Hemorrhoids, internal, with bleeding 09/07/2011   Acquired anal stenosis 09/07/2011   Hyperlipidemia 09/07/2011   Family history of breast cancer in sister 09/07/2011   Past Medical History:  Diagnosis Date   anal ca dx'd 11/2011   squamous cell carcinoma   Anxiety    Arthritis 2022   Depression    Full dentures    Hemorrhoids    external   History of radiation therapy 11/29/11 to 01/05/12   anal canal 5040 cGy 28 sessions, regional lymph nodes 4200 cGy 28 sessions   Hyperlipidemia    Lumbar radiculopathy 10/10/2019   Rectal bleeding    Staphylococcus aureus bacteremia with sepsis (HCC)    UTI (urinary tract infection) 06/26/2021   Vitamin D deficiency    Wears glasses     Family History  Problem Relation Age of Onset   Cancer Sister        breast/ age 65   Cervical cancer Sister        58   Alcohol abuse Maternal Uncle     Past Surgical History:  Procedure Laterality Date   ABDOMINAL HYSTERECTOMY  1978   age 56   BUBBLE STUDY  12/04/2020   Procedure: BUBBLE STUDY;  Surgeon: Parke Poisson, MD;  Location: [facility] ENDOSCOPY;  Service: Cardiology;;   EXAMINATION UNDER ANESTHESIA  06/21/2012   Procedure: EXAM UNDER ANESTHESIA;  Surgeon: [name], MD;  Location: [facility];  Service: General;  Laterality: N/A;  rectal exam under anesthesia, anal dilation, rectal biopsy   HEMORRHOID SURGERY  1980   Patient had to guess on the date.   RECTAL BIOPSY  06/21/2012   Procedure: BIOPSY RECTAL;  Surgeon: [name], MD;  Location: [facility];  Service: General;  Laterality: N/A;   TEE WITHOUT CARDIOVERSION N/A 12/04/2020   Procedure: TRANSESOPHAGEAL ECHOCARDIOGRAM (TEE);  Surgeon: Parke Poisson, MD;  Location: [facility] ENDOSCOPY;  Service: Cardiology;  Laterality: N/A;   TOTAL KNEE ARTHROPLASTY Right  06/09/2021   Procedure: RIGHT TOTAL KNEE ARTHROPLASTY;  Surgeon: Cammy Copa, MD;  Location: [facility] OR;  Service: Orthopedics;  Laterality: Right;   TUBAL LIGATION     b/l  age 29   Social History   Occupational History   Not on file  Tobacco Use   Smoking status: Former    Current packs/day: 0.00    Average packs/day: 0.5 packs/day for 25.0 years (12.5 ttl pk-yrs)    Types: Cigarettes    Start date: 09/06/1962    Quit date: 09/07/1987    Years since quitting: 35.9   Smokeless tobacco: Never  Vaping Use   Vaping status: Never Used  Substance and Sexual Activity   Alcohol use: No   Drug use: No   Sexual activity: Not Currently

## 2023-09-02 ENCOUNTER — Encounter: Payer: Self-pay | Admitting: Family Medicine

## 2023-09-03 ENCOUNTER — Ambulatory Visit
Admission: RE | Admit: 2023-09-03 | Discharge: 2023-09-03 | Disposition: A | Discharge status: Home / Self Care | Payer: Medicare PPO | Source: Ambulatory Visit | Attending: Family Medicine | Admitting: Family Medicine

## 2023-09-03 DIAGNOSIS — R410 Disorientation, unspecified: Secondary | ICD-10-CM | POA: Diagnosis not present

## 2023-09-03 DIAGNOSIS — R413 Other amnesia: Secondary | ICD-10-CM

## 2023-09-03 MED ORDER — GADOPICLENOL 0.5 MMOL/ML IV SOLN
7.5000 mL | Freq: Once | INTRAVENOUS | Status: AC | PRN
  Administered 2023-09-03: 7.5 mL via INTRAVENOUS

## 2023-09-15 ENCOUNTER — Ambulatory Visit: Payer: Medicare PPO | Admitting: Neurology

## 2023-09-15 ENCOUNTER — Encounter: Payer: Self-pay | Admitting: Neurology

## 2023-10-04 DIAGNOSIS — Z9989 Dependence on other enabling machines and devices: Secondary | ICD-10-CM | POA: Diagnosis not present

## 2023-10-04 DIAGNOSIS — E785 Hyperlipidemia, unspecified: Secondary | ICD-10-CM | POA: Diagnosis not present

## 2023-10-04 DIAGNOSIS — I10 Essential (primary) hypertension: Secondary | ICD-10-CM | POA: Diagnosis not present

## 2023-10-04 DIAGNOSIS — G319 Degenerative disease of nervous system, unspecified: Secondary | ICD-10-CM | POA: Diagnosis not present

## 2023-10-04 DIAGNOSIS — F3341 Major depressive disorder, recurrent, in partial remission: Secondary | ICD-10-CM | POA: Diagnosis not present

## 2023-10-04 DIAGNOSIS — R413 Other amnesia: Secondary | ICD-10-CM | POA: Diagnosis not present

## 2023-10-04 DIAGNOSIS — I700 Atherosclerosis of aorta: Secondary | ICD-10-CM | POA: Diagnosis not present

## 2023-10-04 DIAGNOSIS — M1712 Unilateral primary osteoarthritis, left knee: Secondary | ICD-10-CM | POA: Diagnosis not present

## 2023-10-04 DIAGNOSIS — R609 Edema, unspecified: Secondary | ICD-10-CM | POA: Diagnosis not present

## 2023-11-01 DIAGNOSIS — R55 Syncope and collapse: Secondary | ICD-10-CM | POA: Diagnosis not present

## 2023-11-01 DIAGNOSIS — R531 Weakness: Secondary | ICD-10-CM | POA: Diagnosis not present

## 2023-11-01 DIAGNOSIS — R110 Nausea: Secondary | ICD-10-CM | POA: Diagnosis not present

## 2023-11-28 ENCOUNTER — Emergency Department (HOSPITAL_COMMUNITY)
Admission: EM | Admit: 2023-11-28 | Discharge: 2023-11-28 | Disposition: A | Discharge status: Home / Self Care | Payer: Medicare PPO | Attending: Emergency Medicine | Admitting: Emergency Medicine

## 2023-11-28 DIAGNOSIS — R110 Nausea: Secondary | ICD-10-CM | POA: Diagnosis not present

## 2023-11-28 DIAGNOSIS — I10 Essential (primary) hypertension: Secondary | ICD-10-CM | POA: Diagnosis not present

## 2023-11-28 LAB — COMPREHENSIVE METABOLIC PANEL
ALT: 21 U/L (ref 0–44)
AST: 27 U/L (ref 15–41)
Albumin: 4.4 g/dL (ref 3.5–5.0)
Alkaline Phosphatase: 52 U/L (ref 38–126)
Anion gap: 10 (ref 5–15)
BUN: 14 mg/dL (ref 8–23)
CO2: 25 mmol/L (ref 22–32)
Calcium: 9 mg/dL (ref 8.9–10.3)
Chloride: 103 mmol/L (ref 98–111)
Creatinine, Ser: 0.57 mg/dL (ref 0.44–1.00)
GFR, Estimated: >60 mL/min (ref >60)
Glucose, Bld: 97 mg/dL (ref 70–99)
Potassium: 3.7 mmol/L (ref 3.5–5.1)
Sodium: 138 mmol/L (ref 135–145)
Total Bilirubin: 0.8 mg/dL (ref 0.0–1.2)
Total Protein: 6.8 g/dL (ref 6.5–8.1)

## 2023-11-28 LAB — URINALYSIS, W/ REFLEX TO CULTURE (INFECTION SUSPECTED)
Bilirubin Urine: NEGATIVE
Glucose, UA: NEGATIVE mg/dL
Hgb urine dipstick: NEGATIVE
Ketones, ur: NEGATIVE mg/dL
Leukocytes,Ua: NEGATIVE
Nitrite: NEGATIVE
Protein, ur: NEGATIVE mg/dL
Specific Gravity, Urine: 1.002 — ABNORMAL LOW (ref 1.005–1.030)
pH: 8 (ref 5.0–8.0)

## 2023-11-28 LAB — CBC WITH DIFFERENTIAL/PLATELET
Abs Immature Granulocytes: 0.01 10*3/uL (ref 0.00–0.07)
Basophils Absolute: 0 10*3/uL (ref 0.0–0.1)
Basophils Relative: 1 %
Eosinophils Absolute: 0.1 10*3/uL (ref 0.0–0.5)
Eosinophils Relative: 3 %
HCT: 37.9 % (ref 36.0–46.0)
Hemoglobin: 12.3 g/dL (ref 12.0–15.0)
Immature Granulocytes: 0 %
Lymphocytes Relative: 22 %
Lymphs Abs: 0.8 10*3/uL (ref 0.7–4.0)
MCH: 33.2 pg (ref 26.0–34.0)
MCHC: 32.5 g/dL (ref 30.0–36.0)
MCV: 102.2 fL — ABNORMAL HIGH (ref 80.0–100.0)
Monocytes Absolute: 0.3 10*3/uL (ref 0.1–1.0)
Monocytes Relative: 8 %
Neutro Abs: 2.5 10*3/uL (ref 1.7–7.7)
Neutrophils Relative %: 66 %
Platelets: 214 10*3/uL (ref 150–400)
RBC: 3.71 MIL/uL — ABNORMAL LOW (ref 3.87–5.11)
RDW: 12.9 % (ref 11.5–15.5)
WBC: 3.8 10*3/uL — ABNORMAL LOW (ref 4.0–10.5)
nRBC: 0 % (ref 0.0–0.2)

## 2023-11-28 MED ORDER — ONDANSETRON 4 MG PO TBDP: 4.0000 mg | ORAL_TABLET | Freq: Three times a day (TID) | ORAL | 0 refills | Status: AC | PRN

## 2023-11-28 NOTE — ED Triage Notes (Signed)
Patient arrived with complaints of nausea and feeling flushed. Seen in the past for the same. States symptoms have subsided on arrival.

## 2023-11-28 NOTE — ED Notes (Signed)
Patient given ice water

## 2023-11-28 NOTE — ED Notes (Signed)
Patient attempted to provide urine sample but missed the cup, will try again later.

## 2023-11-28 NOTE — ED Provider Notes (Signed)
[place] EMERGENCY DEPARTMENT AT [facility] Provider Note  CSN: [id number] Arrival date & time: 11/28/23 0121  Chief Complaint(s) Nausea  HPI [name] is a 78 y.o. female with a past medical history listed below who presents to the emergency department for 1 day of nausea without emesis.  She denies any associated chest pain or shortness of breath.  No abdominal pain.  No diarrhea.  She denies any recent fevers or infections.  No cough or congestion.  She reports still being able to eat and tolerate p.o. intake.  She was brought in by EMS.  Nausea completely resolved and route without treatment.  The history is provided by the patient.    Past Medical History Past Medical History:  Diagnosis Date   anal ca dx'd 11/2011   squamous cell carcinoma   Anxiety    Arthritis 2022   Depression    Full dentures    Hemorrhoids    external   History of radiation therapy 11/29/11 to 01/05/12   anal canal 5040 cGy 28 sessions, regional lymph nodes 4200 cGy 28 sessions   Hyperlipidemia    Lumbar radiculopathy 10/10/2019   Rectal bleeding    Staphylococcus aureus bacteremia with sepsis (HCC)    UTI (urinary tract infection) 06/26/2021   Vitamin D deficiency    Wears glasses    Patient Active Problem List   Diagnosis Date Noted   E coli bacteremia    Acute lower UTI    Sepsis due to undetermined organism (HCC) 09/19/2021   Hypokalemia 09/19/2021   Leukopenia 09/19/2021   Prolonged QT interval 09/19/2021   Arthritis of right knee    UTI (urinary tract infection) 06/26/2021   Severe sepsis (HCC) 06/25/2021   Acute metabolic encephalopathy 06/25/2021   Hypothyroid 06/25/2021   S/P total knee arthroplasty, right 06/09/2021   Fever    Staphylococcus aureus bacteremia with sepsis (HCC)    SIRS (systemic inflammatory response syndrome) (HCC) 11/30/2020   Lumbar radiculopathy 10/10/2019   Bilateral stenosis of lateral recess of lumbar spine 10/10/2019   Hemorrhoids,  external without complications 06/17/2014   Depression    Anxiety    Rectal bleeding    History of radiation therapy    Malignant neoplasm of anal canal (HCC) 11/15/2011   Hemorrhoids, internal, with bleeding 09/07/2011   Acquired anal stenosis 09/07/2011   Hyperlipidemia 09/07/2011   Family history of breast cancer in sister 09/07/2011   Home Medication(s) Prior to Admission medications   Medication Sig Start Date End Date Taking? Authorizing Provider  ondansetron (ZOFRAN-ODT) 4 MG disintegrating tablet Take 1 tablet (4 mg total) by mouth every 8 (eight) hours as needed for up to 3 days for nausea or vomiting. 11/28/23 12/01/23 Yes Jimya Ciani, Amadeo Garnet, MD  QUEtiapine (SEROQUEL) 200 MG tablet Take 200 mg by mouth at bedtime. 06/29/23  Yes [provider]  acetic acid-hydrocortisone (VOSOL-HC) OTIC solution     [provider]  alendronate (FOSAMAX) 70 MG tablet Take 70 mg by mouth once a week. 04/21/23   [provider]  ARIPiprazole (ABILIFY) 10 MG tablet Take 10 mg by mouth every morning. 09/07/22   [provider]  celecoxib (CELEBREX) 100 MG capsule Take 1 capsule (100 mg total) by mouth daily as needed for mild pain. Patient taking differently: Take 100 mg by mouth daily. 07/20/21   Magnant, Charles L, PA-C  diclofenac Sodium (VOLTAREN) 1 % GEL     [provider]  DULoxetine (CYMBALTA) 60 MG capsule  Take 120 mg by mouth daily.    [provider]  furosemide (LASIX) 40 MG tablet Take 40 mg by mouth daily.    [provider]  gabapentin (NEURONTIN) 600 MG tablet Take 600 mg by mouth 2 (two) times daily. 07/14/16   [provider]  levothyroxine (SYNTHROID, LEVOTHROID) 25 MCG tablet Take 25 mcg by mouth daily before breakfast. 08/29/18   [provider]  meclizine (ANTIVERT) 25 MG tablet Take 25 mg by mouth daily as needed for nausea or dizziness. 06/10/23   [provider]  potassium chloride (KLOR-CON)  10 MEQ tablet Take 10 mEq by mouth 2 (two) times daily. 06/24/22   [provider]  pravastatin (PRAVACHOL) 40 MG tablet Take 40 mg by mouth daily.   Yes [provider]  QUEtiapine (SEROQUEL) 300 MG tablet Take 0.5 tablets by mouth at bedtime.    [provider]  traZODone (DESYREL) 100 MG tablet Take 200 mg by mouth at bedtime.   Yes [provider]  valsartan (DIOVAN) 160 MG tablet Take 160 mg by mouth daily. 06/20/23   [provider]                                                                                                                                    Allergies Patient has no known allergies.  Review of Systems Review of Systems As noted in HPI  Physical Exam Vital Signs  I have reviewed the triage vital signs BP (!) 155/83 (BP Location: Right Arm)   Pulse 69   Temp 98 F (36.7 C) (Oral)   Resp 12   SpO2 99%   Physical Exam Vitals reviewed.  Constitutional:      General: She is not in acute distress.    Appearance: She is well-developed. She is not diaphoretic.  HENT:     Head: Normocephalic and atraumatic.     Nose: Nose normal.  Eyes:     General: No scleral icterus.       Right eye: No discharge.        Left eye: No discharge.     Conjunctiva/sclera: Conjunctivae normal.     Pupils: Pupils are equal, round, and reactive to light.  Cardiovascular:     Rate and Rhythm: Normal rate and regular rhythm.     Heart sounds: No murmur heard.    No friction rub. No gallop.  Pulmonary:     Effort: Pulmonary effort is normal. No respiratory distress.     Breath sounds: Normal breath sounds. No stridor. No rales.  Abdominal:     General: There is no distension.     Palpations: Abdomen is soft.     Tenderness: There is no abdominal tenderness.  Musculoskeletal:        General: No tenderness.     Cervical back: Normal range of motion and neck supple.  Skin:    General: Skin is  warm and dry.     Findings: No erythema  or rash.  Neurological:     Mental Status: She is alert and oriented to person, place, and time.     ED Results and Treatments Labs (all labs ordered are listed, but only abnormal results are displayed) Labs Reviewed  CBC WITH DIFFERENTIAL/PLATELET - Abnormal; Notable for the following components:      Result Value   WBC 3.8 (*)    RBC 3.71 (*)    MCV 102.2 (*)    All other components within normal limits  URINALYSIS, W/ REFLEX TO CULTURE (INFECTION SUSPECTED) - Abnormal; Notable for the following components:   Color, Urine STRAW (*)    Specific Gravity, Urine 1.002 (*)    Bacteria, UA RARE (*)    All other components within normal limits  COMPREHENSIVE METABOLIC PANEL                                                                                                                         EKG  EKG Interpretation Date/Time:  Monday November 28 2023 04:37:39 EST Ventricular Rate:  75 PR Interval:  152 QRS Duration:  90 QT Interval:  434 QTC Calculation: 485 R Axis:   -30  Text Interpretation: Sinus rhythm Left axis deviation Low voltage, precordial leads Abnormal R-wave progression, early transition Borderline T abnormalities, inferior leads No significant change was found Confirmed by Drema Pry [phone]) on 11/28/2023 5:57:04 AM       Radiology No results found.  Medications Ordered in ED Medications - No data to display Procedures Procedures  (including critical care time) Medical Decision Making / ED Course   Medical Decision Making Amount and/or Complexity of Data Reviewed Labs: ordered. Decision-making details documented in ED Course. ECG/medicine tests: ordered and independent interpretation performed. Decision-making details documented in ED Course.  Risk Prescription drug management.    Resolved nausea. Labs reassuring without leukocytosis or anemia.  No electrolyte derangements or renal sufficiency.  No biliary obstruction.  UA without evidence of  infection, hematuria or dehydration.  EKG without acute ischemic changes, dysrhythmias or blocks.  She was monitored for several hours and able to tolerate p.o. intake.    Final Clinical Impression(s) / ED Diagnoses Final diagnoses:  Nausea   The patient appears reasonably screened and/or stabilized for discharge and I doubt any other medical condition or other [facility] requiring further screening, evaluation, or treatment in the ED at this time. I have discussed the findings, Dx and Tx plan with the patient/family who expressed understanding and agree(s) with the plan. Discharge instructions discussed at length. The patient/family was given strict return precautions who verbalized understanding of the instructions. No further questions at time of discharge.  Disposition: Discharge  Condition: Good  ED Discharge Orders          Ordered    ondansetron (ZOFRAN-ODT) 4 MG disintegrating tablet  Every 8 hours PRN        11/28/23 0557  Follow Up: Laurann Montana, MD [phone] [address] [place] [address] [phone]  Call  to schedule an appointment for close follow up    This chart was dictated using voice recognition software.  Despite best efforts to proofread,  errors can occur which can change the documentation meaning.    Nira Conn, MD 11/28/23 0630

## 2023-11-29 DIAGNOSIS — R296 Repeated falls: Secondary | ICD-10-CM | POA: Diagnosis not present

## 2023-11-29 DIAGNOSIS — R531 Weakness: Secondary | ICD-10-CM | POA: Diagnosis not present

## 2023-11-29 DIAGNOSIS — Z742 Need for assistance at home and no other household member able to render care: Secondary | ICD-10-CM | POA: Diagnosis not present

## 2023-11-29 DIAGNOSIS — I10 Essential (primary) hypertension: Secondary | ICD-10-CM | POA: Diagnosis not present

## 2023-12-01 DIAGNOSIS — M6281 Muscle weakness (generalized): Secondary | ICD-10-CM | POA: Diagnosis not present

## 2023-12-01 DIAGNOSIS — M1711 Unilateral primary osteoarthritis, right knee: Secondary | ICD-10-CM | POA: Diagnosis not present

## 2023-12-01 DIAGNOSIS — G309 Alzheimer's disease, unspecified: Secondary | ICD-10-CM | POA: Diagnosis not present

## 2023-12-01 DIAGNOSIS — E559 Vitamin D deficiency, unspecified: Secondary | ICD-10-CM | POA: Diagnosis not present

## 2023-12-01 DIAGNOSIS — R296 Repeated falls: Secondary | ICD-10-CM | POA: Diagnosis not present

## 2023-12-01 DIAGNOSIS — I10 Essential (primary) hypertension: Secondary | ICD-10-CM | POA: Diagnosis not present

## 2023-12-01 DIAGNOSIS — E039 Hypothyroidism, unspecified: Secondary | ICD-10-CM | POA: Diagnosis not present

## 2023-12-01 DIAGNOSIS — M51369 Other intervertebral disc degeneration, lumbar region without mention of lumbar back pain or lower extremity pain: Secondary | ICD-10-CM | POA: Diagnosis not present

## 2023-12-01 DIAGNOSIS — M8580 Other specified disorders of bone density and structure, unspecified site: Secondary | ICD-10-CM | POA: Diagnosis not present

## 2023-12-08 DIAGNOSIS — E559 Vitamin D deficiency, unspecified: Secondary | ICD-10-CM | POA: Diagnosis not present

## 2023-12-08 DIAGNOSIS — E039 Hypothyroidism, unspecified: Secondary | ICD-10-CM | POA: Diagnosis not present

## 2023-12-08 DIAGNOSIS — M8580 Other specified disorders of bone density and structure, unspecified site: Secondary | ICD-10-CM | POA: Diagnosis not present

## 2023-12-08 DIAGNOSIS — M51369 Other intervertebral disc degeneration, lumbar region without mention of lumbar back pain or lower extremity pain: Secondary | ICD-10-CM | POA: Diagnosis not present

## 2023-12-08 DIAGNOSIS — G309 Alzheimer's disease, unspecified: Secondary | ICD-10-CM | POA: Diagnosis not present

## 2023-12-08 DIAGNOSIS — I10 Essential (primary) hypertension: Secondary | ICD-10-CM | POA: Diagnosis not present

## 2023-12-08 DIAGNOSIS — M6281 Muscle weakness (generalized): Secondary | ICD-10-CM | POA: Diagnosis not present

## 2023-12-08 DIAGNOSIS — R296 Repeated falls: Secondary | ICD-10-CM | POA: Diagnosis not present

## 2023-12-08 DIAGNOSIS — M1711 Unilateral primary osteoarthritis, right knee: Secondary | ICD-10-CM | POA: Diagnosis not present

## 2023-12-15 DIAGNOSIS — M6281 Muscle weakness (generalized): Secondary | ICD-10-CM | POA: Diagnosis not present

## 2023-12-15 DIAGNOSIS — E559 Vitamin D deficiency, unspecified: Secondary | ICD-10-CM | POA: Diagnosis not present

## 2023-12-15 DIAGNOSIS — M51369 Other intervertebral disc degeneration, lumbar region without mention of lumbar back pain or lower extremity pain: Secondary | ICD-10-CM | POA: Diagnosis not present

## 2023-12-15 DIAGNOSIS — I10 Essential (primary) hypertension: Secondary | ICD-10-CM | POA: Diagnosis not present

## 2023-12-15 DIAGNOSIS — M1711 Unilateral primary osteoarthritis, right knee: Secondary | ICD-10-CM | POA: Diagnosis not present

## 2023-12-15 DIAGNOSIS — M8580 Other specified disorders of bone density and structure, unspecified site: Secondary | ICD-10-CM | POA: Diagnosis not present

## 2023-12-15 DIAGNOSIS — R296 Repeated falls: Secondary | ICD-10-CM | POA: Diagnosis not present

## 2023-12-15 DIAGNOSIS — E039 Hypothyroidism, unspecified: Secondary | ICD-10-CM | POA: Diagnosis not present

## 2023-12-15 DIAGNOSIS — G309 Alzheimer's disease, unspecified: Secondary | ICD-10-CM | POA: Diagnosis not present

## 2023-12-19 DIAGNOSIS — M6281 Muscle weakness (generalized): Secondary | ICD-10-CM | POA: Diagnosis not present

## 2023-12-19 DIAGNOSIS — I10 Essential (primary) hypertension: Secondary | ICD-10-CM | POA: Diagnosis not present

## 2023-12-19 DIAGNOSIS — E559 Vitamin D deficiency, unspecified: Secondary | ICD-10-CM | POA: Diagnosis not present

## 2023-12-19 DIAGNOSIS — R296 Repeated falls: Secondary | ICD-10-CM | POA: Diagnosis not present

## 2023-12-19 DIAGNOSIS — G309 Alzheimer's disease, unspecified: Secondary | ICD-10-CM | POA: Diagnosis not present

## 2023-12-19 DIAGNOSIS — E039 Hypothyroidism, unspecified: Secondary | ICD-10-CM | POA: Diagnosis not present

## 2023-12-19 DIAGNOSIS — M1711 Unilateral primary osteoarthritis, right knee: Secondary | ICD-10-CM | POA: Diagnosis not present

## 2023-12-19 DIAGNOSIS — M51369 Other intervertebral disc degeneration, lumbar region without mention of lumbar back pain or lower extremity pain: Secondary | ICD-10-CM | POA: Diagnosis not present

## 2023-12-19 DIAGNOSIS — M8580 Other specified disorders of bone density and structure, unspecified site: Secondary | ICD-10-CM | POA: Diagnosis not present

## 2023-12-21 ENCOUNTER — Other Ambulatory Visit: Payer: Self-pay

## 2023-12-21 ENCOUNTER — Emergency Department (HOSPITAL_COMMUNITY)
Admission: EM | Admit: 2023-12-21 | Discharge: 2023-12-21 | Discharge status: Against Medical Advice | Payer: Medicare PPO | Attending: Emergency Medicine | Admitting: Emergency Medicine

## 2023-12-21 DIAGNOSIS — Z79899 Other long term (current) drug therapy: Secondary | ICD-10-CM | POA: Diagnosis not present

## 2023-12-21 DIAGNOSIS — M51369 Other intervertebral disc degeneration, lumbar region without mention of lumbar back pain or lower extremity pain: Secondary | ICD-10-CM | POA: Diagnosis not present

## 2023-12-21 DIAGNOSIS — Z5329 Procedure and treatment not carried out because of patient's decision for other reasons: Secondary | ICD-10-CM | POA: Diagnosis not present

## 2023-12-21 DIAGNOSIS — I10 Essential (primary) hypertension: Secondary | ICD-10-CM | POA: Insufficient documentation

## 2023-12-21 DIAGNOSIS — M8580 Other specified disorders of bone density and structure, unspecified site: Secondary | ICD-10-CM | POA: Diagnosis not present

## 2023-12-21 DIAGNOSIS — W19XXXA Unspecified fall, initial encounter: Secondary | ICD-10-CM | POA: Insufficient documentation

## 2023-12-21 DIAGNOSIS — R42 Dizziness and giddiness: Secondary | ICD-10-CM | POA: Insufficient documentation

## 2023-12-21 DIAGNOSIS — M6281 Muscle weakness (generalized): Secondary | ICD-10-CM | POA: Diagnosis not present

## 2023-12-21 DIAGNOSIS — E559 Vitamin D deficiency, unspecified: Secondary | ICD-10-CM | POA: Diagnosis not present

## 2023-12-21 DIAGNOSIS — E039 Hypothyroidism, unspecified: Secondary | ICD-10-CM | POA: Insufficient documentation

## 2023-12-21 DIAGNOSIS — G309 Alzheimer's disease, unspecified: Secondary | ICD-10-CM | POA: Diagnosis not present

## 2023-12-21 DIAGNOSIS — R296 Repeated falls: Secondary | ICD-10-CM | POA: Diagnosis not present

## 2023-12-21 DIAGNOSIS — M1711 Unilateral primary osteoarthritis, right knee: Secondary | ICD-10-CM | POA: Diagnosis not present

## 2023-12-21 LAB — CBC WITH DIFFERENTIAL/PLATELET
Abs Immature Granulocytes: 0.01 10*3/uL (ref 0.00–0.07)
Basophils Absolute: 0 10*3/uL (ref 0.0–0.1)
Basophils Relative: 1 %
Eosinophils Absolute: 0.2 10*3/uL (ref 0.0–0.5)
Eosinophils Relative: 5 %
HCT: 40.1 % (ref 36.0–46.0)
Hemoglobin: 11.8 g/dL — ABNORMAL LOW (ref 12.0–15.0)
Immature Granulocytes: 0 %
Lymphocytes Relative: 33 %
Lymphs Abs: 1.6 10*3/uL (ref 0.7–4.0)
MCH: 32.6 pg (ref 26.0–34.0)
MCHC: 29.4 g/dL — ABNORMAL LOW (ref 30.0–36.0)
MCV: 110.8 fL — ABNORMAL HIGH (ref 80.0–100.0)
Monocytes Absolute: 0.6 10*3/uL (ref 0.1–1.0)
Monocytes Relative: 11 %
Neutro Abs: 2.5 10*3/uL (ref 1.7–7.7)
Neutrophils Relative %: 50 %
Platelets: 210 10*3/uL (ref 150–400)
RBC: 3.62 MIL/uL — ABNORMAL LOW (ref 3.87–5.11)
RDW: 13.2 % (ref 11.5–15.5)
WBC: 4.9 10*3/uL (ref 4.0–10.5)
nRBC: 0 % (ref 0.0–0.2)

## 2023-12-21 LAB — COMPREHENSIVE METABOLIC PANEL
ALT: 23 U/L (ref 0–44)
AST: 27 U/L (ref 15–41)
Albumin: 4.3 g/dL (ref 3.5–5.0)
Alkaline Phosphatase: 59 U/L (ref 38–126)
Anion gap: 9 (ref 5–15)
BUN: 21 mg/dL (ref 8–23)
CO2: 24 mmol/L (ref 22–32)
Calcium: 9.1 mg/dL (ref 8.9–10.3)
Chloride: 108 mmol/L (ref 98–111)
Creatinine, Ser: 0.93 mg/dL (ref 0.44–1.00)
GFR, Estimated: >60 mL/min (ref >60)
Glucose, Bld: 76 mg/dL (ref 70–99)
Potassium: 4.4 mmol/L (ref 3.5–5.1)
Sodium: 141 mmol/L (ref 135–145)
Total Bilirubin: 0.5 mg/dL (ref 0.0–1.2)
Total Protein: 7.1 g/dL (ref 6.5–8.1)

## 2023-12-21 NOTE — ED Triage Notes (Signed)
 Pt arrived via POV. C/o mech fall last night, bumped head on fridge. N/o c/o pain, no LOC, not on blood thinners. Caregiver states they've noticed that pt sometimes days several doses of her meds at a time.  AOx4

## 2023-12-21 NOTE — Discharge Instructions (Signed)
 Were seen in the emergency department for your dizziness and your fall.  You left AGAINST MEDICAL ADVICE prior to the completion of your workup so it is unclear if you could have been severely dehydrated or have any life-threatening injuries from the fall.  It is also possible that your dizziness could be related to taking her medications inappropriately and you should talk to your pharmacist about if they can organize your medications by day so that you know what to take each day.  You should follow-up with your primary doctor within the next few days to have your symptoms rechecked and for further workup.  You can return to the emergency department any time if you would like reevaluation or if you have any other new or concerning symptoms.

## 2023-12-21 NOTE — ED Provider Notes (Signed)
 [place] EMERGENCY DEPARTMENT AT [facility] Provider Note   CSN: [id number] Arrival date & time: 12/21/23  1032     History  Chief Complaint  Patient presents with   Marletta Lor    [name] is a 78 y.o. female.  Patient is a 78 year old female with past medical history of hypertension, hypothyroidism, depression presenting to the emergency department after a fall.  Patient states that normally she walks with a cane and has not using her cane to walk yesterday and she became dizzy and fell.  She does endorse hitting her head but denies any loss of consciousness.  She states that she has had some dizziness since then so where she feels off balance.  She denies any chest pain or shortness of breath.  She denies any headaches or other pain from the fall and denies any numbness or weakness.  Patient is here with her caretaker who states over the last week she has been taking her medications and appropriately.  The caretaker reports that she comes every Monday Wednesday and Friday and that on Monday she noticed that she took a whole weeks worth of pills and today took 2 days worth of pills.  The patient denies any SI and states that she is just confused about which medication she is supposed to be taking.  She denies any blood thinner use.  The history is provided by the patient and a caregiver.  Fall       Home Medications Prior to Admission medications   Medication Sig Start Date End Date Taking? Authorizing Provider  acetic acid-hydrocortisone (VOSOL-HC) OTIC solution     [provider]  alendronate (FOSAMAX) 70 MG tablet Take 70 mg by mouth once a week. 04/21/23   [provider]  ARIPiprazole (ABILIFY) 10 MG tablet Take 10 mg by mouth every morning. 09/07/22   [provider]  celecoxib (CELEBREX) 100 MG capsule Take 1 capsule (100 mg total) by mouth daily as needed for mild pain. Patient taking differently: Take 100 mg by mouth daily. 07/20/21    Magnant, Charles L, PA-C  diclofenac Sodium (VOLTAREN) 1 % GEL     [provider]  DULoxetine (CYMBALTA) 60 MG capsule Take 120 mg by mouth daily.    [provider]  furosemide (LASIX) 40 MG tablet Take 40 mg by mouth daily.    [provider]  gabapentin (NEURONTIN) 600 MG tablet Take 600 mg by mouth 2 (two) times daily. 07/14/16   [provider]  levothyroxine (SYNTHROID, LEVOTHROID) 25 MCG tablet Take 25 mcg by mouth daily before breakfast. 08/29/18   [provider]  meclizine (ANTIVERT) 25 MG tablet Take 25 mg by mouth daily as needed for nausea or dizziness. 06/10/23   [provider]  potassium chloride (KLOR-CON) 10 MEQ tablet Take 10 mEq by mouth 2 (two) times daily. 06/24/22   [provider]  pravastatin (PRAVACHOL) 40 MG tablet Take 40 mg by mouth daily.    [provider]  QUEtiapine (SEROQUEL) 200 MG tablet Take 200 mg by mouth at bedtime. 06/29/23   [provider]  QUEtiapine (SEROQUEL) 300 MG tablet Take 0.5 tablets by mouth at bedtime.    [provider]  traZODone (DESYREL) 100 MG tablet Take 200 mg by mouth at bedtime.    [provider]  valsartan (DIOVAN) 160 MG tablet Take 160 mg by mouth daily. 06/20/23   [provider]      Allergies  Patient has no known allergies.    Review of Systems   Review of Systems  Physical Exam Updated Vital Signs BP (!) 114/55 (BP Location: Right Arm)   Pulse 66   Temp 98 F (36.7 C) (Oral)   Resp 20   Ht 5\' 5"  (1.651 m)   Wt 75 kg   SpO2 97%   BMI 27.51 kg/m  Physical Exam Vitals and nursing note reviewed.  Constitutional:      General: She is not in acute distress.    Appearance: Normal appearance.  HENT:     Head: Normocephalic and atraumatic.     Nose: Nose normal.     Mouth/Throat:     Mouth: Mucous membranes are moist.     Pharynx: Oropharynx is clear.  Eyes:     Extraocular Movements: Extraocular movements  intact.     Conjunctiva/sclera: Conjunctivae normal.     Pupils: Pupils are equal, round, and reactive to light.     Comments: No nystagmus  Neck:     Comments: No midline neck tenderness Cardiovascular:     Rate and Rhythm: Normal rate and regular rhythm.     Heart sounds: Normal heart sounds.  Pulmonary:     Effort: Pulmonary effort is normal.     Breath sounds: Normal breath sounds.  Abdominal:     General: Abdomen is flat.     Palpations: Abdomen is soft.     Tenderness: There is no abdominal tenderness.  Musculoskeletal:        General: Normal range of motion.     Cervical back: Normal range of motion and neck supple.     Comments: No midline back tenderness, no bony tenderness to bilateral upper or lower extremities  Skin:    General: Skin is warm and dry.  Neurological:     General: No focal deficit present.     Mental Status: She is alert and oriented to person, place, and time.     Cranial Nerves: No cranial nerve deficit.     Sensory: No sensory deficit.     Motor: No weakness.     Coordination: Coordination normal.  Psychiatric:        Mood and Affect: Mood normal.        Behavior: Behavior normal.     ED Results / Procedures / Treatments   Labs (all labs ordered are listed, but only abnormal results are displayed) Labs Reviewed  CBC WITH DIFFERENTIAL/PLATELET - Abnormal; Notable for the following components:      Result Value   RBC 3.62 (*)    Hemoglobin 11.8 (*)    MCV 110.8 (*)    MCHC 29.4 (*)    All other components within normal limits  COMPREHENSIVE METABOLIC PANEL    EKG EKG Interpretation Date/Time:  Wednesday December 21 2023 12:42:09 EST Ventricular Rate:  72 PR Interval:  154 QRS Duration:  72 QT Interval:  412 QTC Calculation: 451 R Axis:   -18  Text Interpretation: Normal sinus rhythm Normal ECG No significant change since last tracing Confirmed by Elayne Snare ([id number]) on 12/21/2023 1:05:16 PM  Radiology No results  found.  Procedures Procedures    Medications Ordered in ED Medications - No data to display  ED Course/ Medical Decision Making/ A&P Clinical Course as of 12/21/23 1319  Wed Dec 21, 2023  1305 Orthostatics negative [VK]  1315 The patient has decided to leave against medical advice. She states she would like to leave as it is snowing  out and doesn't want to be caught here with the bad weather. The patient had a normal mental status examination and understands her condition and the risks of leaving including recurrent syncope, intracranial hemorrhage, skull or spinal fracture, severe dehydration that could lead to permanent disability and death. The patient has had an opportunity to ask questions about her medical condition. The patient has been informed  that she may return for care at any time and has been referred to her physician.  [VK]    Clinical Course User Index [VK] Rexford Maus, DO                                 Medical Decision Making This patient presents to the ED with chief complaint(s) of dizziness, fall with pertinent past medical history of depression, hypertension, hypothyroidism which further complicates the presenting complaint. The complaint involves an extensive differential diagnosis and also carries with it a high risk of complications and morbidity.    The differential diagnosis includes due to patient's age and head trauma concern for ICH, mass effect, cervical spine fracture, no focal neurologic deficits making CVA unlikely.  Arrhythmia, anemia, dehydration, electrolyte abnormality, medication overdose or side effect, orthostatic hypotension  Additional history obtained: Additional history obtained from caregiver Records reviewed n/a  ED Course and Reassessment: On patient's arrival she is hemodynamically stable in no acute distress.  Due to patient's age and fall will have CT head and C-spine performed to evaluate for traumatic injury.  With  associated dizziness will have EKG and labs including orthostatics.  She is on antihypertensives as well as antipsychotics that could increase dizziness or cause hypotension that may have contributed to the fall.  Independent labs interpretation:  The following labs were independently interpreted: CBC at baseline, otherwise labs pending  Independent visualization of imaging: - N/A  Consultation: - Consulted or discussed management/test interpretation w/ external professional: N/A  Consideration for admission or further workup: considered additional labs and imaging, but patient left against medical advice prior to completion of work up  Social Determinants of health: N/A    Amount and/or Complexity of Data Reviewed Labs: ordered. Radiology: ordered.          Final Clinical Impression(s) / ED Diagnoses Final diagnoses:  Fall, initial encounter  Dizziness  Left against medical advice    Rx / DC Orders ED Discharge Orders     None         Rexford Maus, DO 12/21/23 1319

## 2023-12-26 ENCOUNTER — Ambulatory Visit: Payer: Medicare PPO | Admitting: Neurology

## 2023-12-26 ENCOUNTER — Encounter: Payer: Self-pay | Admitting: Neurology

## 2023-12-26 VITALS — BP 173/91 | HR 77 | Ht 65.0 in | Wt 171.5 lb

## 2023-12-26 DIAGNOSIS — R4189 Other symptoms and signs involving cognitive functions and awareness: Secondary | ICD-10-CM

## 2023-12-26 DIAGNOSIS — G4720 Circadian rhythm sleep disorder, unspecified type: Secondary | ICD-10-CM

## 2023-12-26 DIAGNOSIS — R269 Unspecified abnormalities of gait and mobility: Secondary | ICD-10-CM

## 2023-12-26 DIAGNOSIS — F03A3 Unspecified dementia, mild, with mood disturbance: Secondary | ICD-10-CM | POA: Diagnosis not present

## 2023-12-26 DIAGNOSIS — I10 Essential (primary) hypertension: Secondary | ICD-10-CM | POA: Diagnosis not present

## 2023-12-26 DIAGNOSIS — E039 Hypothyroidism, unspecified: Secondary | ICD-10-CM | POA: Diagnosis not present

## 2023-12-26 DIAGNOSIS — M51369 Other intervertebral disc degeneration, lumbar region without mention of lumbar back pain or lower extremity pain: Secondary | ICD-10-CM | POA: Diagnosis not present

## 2023-12-26 DIAGNOSIS — R296 Repeated falls: Secondary | ICD-10-CM | POA: Diagnosis not present

## 2023-12-26 DIAGNOSIS — E559 Vitamin D deficiency, unspecified: Secondary | ICD-10-CM | POA: Diagnosis not present

## 2023-12-26 DIAGNOSIS — M6281 Muscle weakness (generalized): Secondary | ICD-10-CM | POA: Diagnosis not present

## 2023-12-26 DIAGNOSIS — G309 Alzheimer's disease, unspecified: Secondary | ICD-10-CM | POA: Diagnosis not present

## 2023-12-26 DIAGNOSIS — M1711 Unilateral primary osteoarthritis, right knee: Secondary | ICD-10-CM | POA: Diagnosis not present

## 2023-12-26 DIAGNOSIS — M8580 Other specified disorders of bone density and structure, unspecified site: Secondary | ICD-10-CM | POA: Diagnosis not present

## 2023-12-26 MED ORDER — DONEPEZIL HCL 5 MG PO TABS: 5.0000 mg | ORAL_TABLET | Freq: Every day | ORAL | 6 refills | Status: DC

## 2023-12-26 NOTE — Progress Notes (Signed)
 [facility]  [name] DOB: [birth year]  REQUESTING CLINICIAN: Laurann Montana, MD HISTORY FROM: Patient, daughter over the phone and son in law REASON FOR VISIT: Memory loss    HISTORICAL  CHIEF COMPLAINT:  Chief Complaint  Patient presents with   Room 12    Pt is here with her Son-in-Law. Pt's Son-in-law states that pt is having Sundowning recently. Pt's Son-in-law states that pt has been agitated lately. Pt's Son-in-Law states that pt has been mixing up medications. Pt has gotten 3 days ahead of her medications. Pt does have a caregiver that works M-W-F.     HISTORY OF PRESENT ILLNESS:  This is a 78 year old woman with past medical history including anxiety/depression, hypothyroidism, dizziness, multiple falls, sleep cycle disruption who is presenting with her son-in-law with memory loss and confusion.  Daughter was available over the phone and was able to provide additional history.  Patient tells me that she feels like her memory is fine, she is limited by back pain and gait abnormality.  She does have a home health aide that comes 3 days a week for 2 hours.   Son in law tells me that patient had a memory problem, she is forgetful and sometimes there is a lot of confusion.  She is confused about the date, confused about if she takes the current medication or not, confused about eating and sometimes she perseverate on the wrong information.  Since having a home health aide, her situation has improve.   Daughter tells me that again she does have sundowning due to her sleep cycle disruption.  Basically she does stay awake the entire night and sleep between 10 AM to 7-10 PM.  Sometimes she gets confused about the date, time of the day, there is sundowning usually in mid afternoon where she get confused, asks for help but unable to state exactly what is wrong.  She currently has not been driving for the past 2 years because family was concerned about her  driving.  She denies having any recent accident or being lost driving in familiar places.  She does cook on the stove, have left the stove on, she tells me this morning she burned her food.  She does not need any help with bathing dressing herself but the home health aide to help her with her medication but in the pillbox and also cleaning and doing laundry.  She does have multiple falls, has an assistive device but does not use it.  She tells me that she started physical therapy.   TBI:   No past history of TBI Stroke:   no past history of stroke Seizures:   no past history of seizures Sleep:   no history of sleep apnea.   Mood: Yes, on medications Family history of Dementia:   Denies  Functional status: Dependent in some IADLs, has a nurse that comes 3 days a week for 2 hours  Patient lives alone but has home health aid, 3 days a week for 2 hours. Cooking: yes Cleaning: no Shopping: no, daughter does it for her Bathing: no issues  Toileting: no issues Driving: no Bills: no, daughter Medications: home health aid helps with the medications Ever left the stove on by accident?: yes Forget how to use items around the house?: no Getting lost going to familiar places?: yes Forgetting loved ones names?: no Word finding difficulty? yes Sleep: sleep cycle disruption, sleeps during the day and awake at night   OTHER  MEDICAL CONDITIONS: Anxiety/Depression, Hypothyroidism, arthritis.   REVIEW OF SYSTEMS: Full 14 system review of systems performed and negative with exception of: As noted in the HPI  ALLERGIES: No Known Allergies  HOME MEDICATIONS: Outpatient Medications Prior to Visit  Medication Sig Dispense Refill   acetaminophen (TYLENOL) 650 MG CR tablet Take 650 mg by mouth every 8 (eight) hours as needed for pain.     BIOTIN PO Take by mouth.     cholecalciferol (VITAMIN D3) 25 MCG (1000 UNIT) tablet Take 1,000 Units by mouth daily.     Cranberry-Vitamin C (AZO CRANBERRY URINARY  TRACT PO) Take by mouth.     DULoxetine (CYMBALTA) 60 MG capsule Take 120 mg by mouth daily.     furosemide (LASIX) 40 MG tablet Take 40 mg by mouth daily.     gabapentin (NEURONTIN) 600 MG tablet Take 600 mg by mouth 2 (two) times daily.     levothyroxine (SYNTHROID, LEVOTHROID) 25 MCG tablet Take 25 mcg by mouth daily before breakfast.     Methylcobalamin 1000 MCG TBDP Take 1,000 mcg by mouth daily.     Multiple Vitamins-Minerals (CENTRUM MINIS WOMEN 50+) TABS Take by mouth.     Omega-3 Fatty Acids (FISH OIL) 1200 MG CAPS Take by mouth.     potassium chloride (KLOR-CON) 10 MEQ tablet Take 10 mEq by mouth 2 (two) times daily.     Turmeric 500 MG CAPS Take 500 mg by mouth daily.     valsartan (DIOVAN) 160 MG tablet Take 160 mg by mouth daily.     acetic acid-hydrocortisone (VOSOL-HC) OTIC solution  (Patient not taking: Reported on 12/26/2023)     alendronate (FOSAMAX) 70 MG tablet Take 70 mg by mouth once a week. (Patient not taking: Reported on 12/26/2023)     diclofenac Sodium (VOLTAREN) 1 % GEL  (Patient not taking: Reported on 12/26/2023)     ARIPiprazole (ABILIFY) 10 MG tablet Take 10 mg by mouth every morning. (Patient not taking: Reported on 12/26/2023)     celecoxib (CELEBREX) 100 MG capsule Take 1 capsule (100 mg total) by mouth daily as needed for mild pain. (Patient not taking: Reported on 12/26/2023) 30 capsule 0   meclizine (ANTIVERT) 25 MG tablet Take 25 mg by mouth daily as needed for nausea or dizziness. (Patient not taking: Reported on 12/26/2023)     pravastatin (PRAVACHOL) 40 MG tablet Take 40 mg by mouth daily. (Patient not taking: Reported on 12/26/2023)     QUEtiapine (SEROQUEL) 200 MG tablet Take 200 mg by mouth at bedtime. (Patient not taking: Reported on 12/26/2023)     QUEtiapine (SEROQUEL) 300 MG tablet Take 0.5 tablets by mouth at bedtime. (Patient not taking: Reported on 12/26/2023)     traZODone (DESYREL) 100 MG tablet Take 200 mg by mouth at bedtime. (Patient not taking:  Reported on 12/26/2023)     No facility-administered medications prior to visit.    PAST MEDICAL HISTORY: Past Medical History:  Diagnosis Date   anal ca dx'd 11/2011   squamous cell carcinoma   Anxiety    Arthritis 2022   Depression    Full dentures    Hemorrhoids    external   History of radiation therapy 11/29/11 to 01/05/12   anal canal 5040 cGy 28 sessions, regional lymph nodes 4200 cGy 28 sessions   Hyperlipidemia    Lumbar radiculopathy 10/10/2019   Rectal bleeding    Staphylococcus aureus bacteremia with sepsis (HCC)    UTI (urinary tract infection) 06/26/2021  Vitamin D deficiency    Wears glasses     PAST SURGICAL HISTORY: Past Surgical History:  Procedure Laterality Date   ABDOMINAL HYSTERECTOMY  1978   age 38   BUBBLE STUDY  12/04/2020   Procedure: BUBBLE STUDY;  Surgeon: Parke Poisson, MD;  Location: [facility] ENDOSCOPY;  Service: Cardiology;;   EXAMINATION UNDER ANESTHESIA  06/21/2012   Procedure: EXAM UNDER ANESTHESIA;  Surgeon: [name], MD;  Location: [facility];  Service: General;  Laterality: N/A;  rectal exam under anesthesia, anal dilation, rectal biopsy   HEMORRHOID SURGERY  1980   Patient had to guess on the date.   RECTAL BIOPSY  06/21/2012   Procedure: BIOPSY RECTAL;  Surgeon: [name], MD;  Location: [facility];  Service: General;  Laterality: N/A;   TEE WITHOUT CARDIOVERSION N/A 12/04/2020   Procedure: TRANSESOPHAGEAL ECHOCARDIOGRAM (TEE);  Surgeon: Parke Poisson, MD;  Location: [facility] ENDOSCOPY;  Service: Cardiology;  Laterality: N/A;   TOTAL KNEE ARTHROPLASTY Right 06/09/2021   Procedure: RIGHT TOTAL KNEE ARTHROPLASTY;  Surgeon: Cammy Copa, MD;  Location: [facility] OR;  Service: Orthopedics;  Laterality: Right;   TUBAL LIGATION     b/l  age 27    FAMILY HISTORY: Family History  Problem Relation Age of Onset   Cancer Sister        breast/ age 22   Cervical cancer Sister        34   Alcohol abuse  Maternal Uncle     SOCIAL HISTORY: Social History   Socioeconomic History   Marital status: Single    Spouse name: Not on file   Number of children: 1   Years of education: Not on file   Highest education level: Not on file  Occupational History   Not on file  Tobacco Use   Smoking status: Former    Current packs/day: 0.00    Average packs/day: 0.5 packs/day for 25.0 years (12.5 ttl pk-yrs)    Types: Cigarettes    Start date: 09/06/1962    Quit date: 09/07/1987    Years since quitting: 36.3   Smokeless tobacco: Never  Vaping Use   Vaping status: Never Used  Substance and Sexual Activity   Alcohol use: No   Drug use: No   Sexual activity: Not Currently  Other Topics Concern   Not on file  Social History Narrative   Divorced, 1 child, works at [employer] handed   Lives in a single story home alone   Social Drivers of Health   Financial Resource Strain: Not on file  Food Insecurity: Not on file  Transportation Needs: No Transportation Needs (06/21/2022)   PRAPARE - Administrator, Civil Service (Medical): No    Lack of Transportation (Non-Medical): No  Physical Activity: Not on file  Stress: Not on file  Social Connections: Not on file  Intimate Partner Violence: Not on file    PHYSICAL EXAM  GENERAL EXAM/CONSTITUTIONAL: Vitals:  Vitals:   12/26/23 0806  BP: (!) 173/91  Pulse: 77  Weight: 171 lb 8 oz (77.8 kg)  Height: 5\' 5"  (1.651 m)   Body mass index is 28.54 kg/m. Wt Readings from Last 3 Encounters:  12/26/23 171 lb 8 oz (77.8 kg)  12/21/23 165 lb 5.5 oz (75 kg)  06/22/23 165 lb 5.5 oz (75 kg)   Patient is in no distress; well developed, nourished and groomed; neck is supple  MUSCULOSKELETAL: Gait, strength, tone, movements  noted in Neurologic exam below  NEUROLOGIC: MENTAL STATUS:     12/26/2023    8:14 AM  MMSE - Mini Mental State Exam  Orientation to time 5  Orientation to Place 2  Registration 3  Attention/  Calculation 1  Recall 2  Language- name 2 objects 2  Language- repeat 0  Language- follow 3 step command 2  Language- read & follow direction 1  Write a sentence 0  Copy design 0  Total score 18    CRANIAL NERVE:  2nd, 3rd, 4th, 6th- visual fields full to confrontation, extraocular muscles intact, no nystagmus 5th - facial sensation symmetric 7th - facial strength symmetric 8th - hearing intact 9th - palate elevates symmetrically, uvula midline 11th - shoulder shrug symmetric 12th - tongue protrusion midline  MOTOR:  normal bulk and tone, full strength in the BUE, BLE  SENSORY:  normal and symmetric to light touch  COORDINATION:  finger-nose-finger, fine finger movements normal  GAIT/STATION:  Stoop forward, uses a cane     DIAGNOSTIC DATA (LABS, IMAGING, TESTING) - I reviewed patient records, labs, notes, testing and imaging myself where available.  Lab Results  Component Value Date   WBC 4.9 12/21/2023   HGB 11.8 (L) 12/21/2023   HCT 40.1 12/21/2023   MCV 110.8 (H) 12/21/2023   PLT 210 12/21/2023      Component Value Date/Time   NA 141 12/21/2023 1216   K 4.4 12/21/2023 1216   CL 108 12/21/2023 1216   CO2 24 12/21/2023 1216   GLUCOSE 76 12/21/2023 1216   BUN 21 12/21/2023 1216   CREATININE 0.93 12/21/2023 1216   CALCIUM 9.1 12/21/2023 1216   PROT 7.1 12/21/2023 1216   ALBUMIN 4.3 12/21/2023 1216   AST 27 12/21/2023 1216   ALT 23 12/21/2023 1216   ALKPHOS 59 12/21/2023 1216   BILITOT 0.5 12/21/2023 1216   GFRNONAA >60 12/21/2023 1216   GFRAA >60 01/17/2019 0732   No results found for: "CHOL", "HDL", "LDLCALC", "LDLDIRECT", "TRIG", "CHOLHDL" No results found for: "HGBA1C" Lab Results  Component Value Date   VITAMINB12 633 06/27/2021   Lab Results  Component Value Date   TSH 3.116 06/27/2021    MRI Brain 09/03/2023 1. No acute intracranial abnormality. But disproportionate atrophy of the mesial temporal lobes, suspicious for underlying  Neurodegenerative disease. Cerebral Atrophy (ICD10-G31.9).   2. Superimposed mild to moderate for age chronic white matter signal changes, most commonly due to chronic small vessel disease. But no other post ischemic findings identified.    ASSESSMENT AND PLAN  78 y.o. year old female with history of anxiety, depression, hypothyroidism, gait abnormality, who is presenting with memory loss described as being forgetful, confusion, and sleep cycle disruption.  She does live alone but has a home aide 3 days a week for about 2 hours.  Aide does help her with her medication, laundry.  She is able to cook and feed herself, she is able to shower and dress herself.  Her daughter is helping her with groceries.  She scored a 18 out of 30 on the Mini-Mental status evaluation concerning for cognitive impairment.  Plan will be to obtain the dementia lab including ATN profile, to look for presence of Alzheimer disease biomarker, her MRI brain showed temporal lobe atrophy.  I do suspect the patient has mild cognitive impairment versus mild dementia.  I will start her on Aricept 5 mg nightly, if able to tolerate the medication, her PCP can increase to 10 mg nightly.  We discussed side effect of the medication including diarrhea, vivid dreams and dizziness.  Advised her to use assistive device with ambulation to avoid fall and to continue with home PT.  She voiced understanding.  Continue to follow with PCP and return as needed.   1. Cognitive impairment   2. Mild dementia with mood disturbance, unspecified dementia type (HCC)   3. Gait abnormality   4. Sleep-wake cycle disorder      Patient Instructions  Continue current medications  Start Aricept 5 mg nightly, please call us if you experience diarrhea, vivid dreams or dizziness  Will obtain Dementia labs, including B12, TSH and ATN profile. I will contact you to go over the results.  Continue to follow up with PCP  Return as needed    There are  well-accepted and sensible ways to reduce risk for Alzheimers disease and other degenerative brain disorders .  Exercise Daily Walk A daily 20 minute walk should be part of your routine. Disease related apathy can be a significant roadblock to exercise and the only way to overcome this is to make it a daily routine and perhaps have a reward at the end (something your loved one loves to eat or drink perhaps) or a personal trainer coming to the home can also be very useful. Most importantly, the patient is much more likely to exercise if the caregiver / spouse does it with him/her. In general a structured, repetitive schedule is best.  General Health: Any diseases which effect your body will effect your brain such as a pneumonia, urinary infection, blood clot, heart attack or stroke. Keep contact with your primary care doctor for regular follow ups.  Sleep. A good nights sleep is healthy for the brain. Seven hours is recommended. If you have insomnia or poor sleep habits we can give you some instructions. If you have sleep apnea wear your mask.  Diet: Eating a heart healthy diet is also a good idea; fish and poultry instead of red meat, nuts (mostly non-peanuts), vegetables, fruits, olive oil or canola oil (instead of butter), minimal salt (use other spices to flavor foods), whole grain rice, bread, cereal and pasta and wine in moderation.Research is now showing that the MIND diet, which is a combination of The Mediterranean diet and the DASH diet, is beneficial for cognitive processing and longevity. Information about this diet can be found in The MIND Diet, a book by Alonna Minium, MS, RDN, and online at [employer].es  Finances, Power of [address] and Advance Directives: You should consider putting legal safeguards in place with regard to financial and medical decision making. While the spouse always has power of attorney for medical and financial issues in the absence of any  form, you should consider what you want in case the spouse / caregiver is no longer around or capable of making decisions.     Orders Placed This Encounter  Procedures   TSH   Vitamin B12   ATN PROFILE    Meds ordered this encounter  Medications   donepezil (ARICEPT) 5 MG tablet    Sig: Take 1 tablet (5 mg total) by mouth at bedtime.    Dispense:  30 tablet    Refill:  6    Return if symptoms worsen or fail to improve.  I have spent a total of 65 minutes dedicated to this patient today, preparing to see patient, performing a medically appropriate examination and evaluation, ordering tests and/or medications and procedures, and counseling and educating the patient/family/caregiver;  independently interpreting result and communicating results to the family/patient/caregiver; and documenting clinical information in the electronic medical record.   Windell Norfolk, MD 12/26/2023, 1:26 PM  [facility] [address] [place], [place] [address] [phone]

## 2023-12-26 NOTE — Patient Instructions (Addendum)
 Continue current medications  Start Aricept 5 mg nightly, please call us if you experience diarrhea, vivid dreams or dizziness  Will obtain Dementia labs, including B12, TSH and ATN profile. I will contact you to go over the results.  Continue to follow up with PCP  Return as needed    There are well-accepted and sensible ways to reduce risk for Alzheimers disease and other degenerative brain disorders .  Exercise Daily Walk A daily 20 minute walk should be part of your routine. Disease related apathy can be a significant roadblock to exercise and the only way to overcome this is to make it a daily routine and perhaps have a reward at the end (something your loved one loves to eat or drink perhaps) or a personal trainer coming to the home can also be very useful. Most importantly, the patient is much more likely to exercise if the caregiver / spouse does it with him/her. In general a structured, repetitive schedule is best.  General Health: Any diseases which effect your body will effect your brain such as a pneumonia, urinary infection, blood clot, heart attack or stroke. Keep contact with your primary care doctor for regular follow ups.  Sleep. A good nights sleep is healthy for the brain. Seven hours is recommended. If you have insomnia or poor sleep habits we can give you some instructions. If you have sleep apnea wear your mask.  Diet: Eating a heart healthy diet is also a good idea; fish and poultry instead of red meat, nuts (mostly non-peanuts), vegetables, fruits, olive oil or canola oil (instead of butter), minimal salt (use other spices to flavor foods), whole grain rice, bread, cereal and pasta and wine in moderation.Research is now showing that the MIND diet, which is a combination of The Mediterranean diet and the DASH diet, is beneficial for cognitive processing and longevity. Information about this diet can be found in The MIND Diet, a book by Alonna Minium, MS, RDN, and online at  [employer]  Finances, Power of [address] and Advance Directives: You should consider putting legal safeguards in place with regard to financial and medical decision making. While the spouse always has power of attorney for medical and financial issues in the absence of any form, you should consider what you want in case the spouse / caregiver is no longer around or capable of making decisions.

## 2023-12-29 DIAGNOSIS — M6281 Muscle weakness (generalized): Secondary | ICD-10-CM | POA: Diagnosis not present

## 2023-12-29 DIAGNOSIS — E039 Hypothyroidism, unspecified: Secondary | ICD-10-CM | POA: Diagnosis not present

## 2023-12-29 DIAGNOSIS — R296 Repeated falls: Secondary | ICD-10-CM | POA: Diagnosis not present

## 2023-12-29 DIAGNOSIS — M1711 Unilateral primary osteoarthritis, right knee: Secondary | ICD-10-CM | POA: Diagnosis not present

## 2023-12-29 DIAGNOSIS — E559 Vitamin D deficiency, unspecified: Secondary | ICD-10-CM | POA: Diagnosis not present

## 2023-12-29 DIAGNOSIS — G309 Alzheimer's disease, unspecified: Secondary | ICD-10-CM | POA: Diagnosis not present

## 2023-12-29 DIAGNOSIS — I10 Essential (primary) hypertension: Secondary | ICD-10-CM | POA: Diagnosis not present

## 2023-12-29 DIAGNOSIS — M51369 Other intervertebral disc degeneration, lumbar region without mention of lumbar back pain or lower extremity pain: Secondary | ICD-10-CM | POA: Diagnosis not present

## 2023-12-29 DIAGNOSIS — M8580 Other specified disorders of bone density and structure, unspecified site: Secondary | ICD-10-CM | POA: Diagnosis not present

## 2023-12-29 LAB — ATN PROFILE
A -- Beta-amyloid 42/40 Ratio: 0.098 — ABNORMAL LOW (ref >0.102)
Beta-amyloid 40: 193.42 pg/mL
Beta-amyloid 42: 18.92 pg/mL
N -- NfL, Plasma: 4.43 pg/mL (ref 0.00–7.64)
T -- p-tau181: 1.35 pg/mL — ABNORMAL HIGH (ref 0.00–0.97)

## 2023-12-29 LAB — TSH: TSH: 1.61 u[IU]/mL (ref 0.450–4.500)

## 2023-12-29 LAB — VITAMIN B12: Vitamin B-12: 795 pg/mL (ref 232–1245)

## 2023-12-29 NOTE — Progress Notes (Signed)
 Please call and advise the patient that the recent labs we checked showed presence of Alzheimer disease biomarkers. Inform patient the memory loss that she is experiencing is due to Alzheimer dementia. Please continue with the Aricept.  Please remind patient to keep any upcoming appointments or tests and to call us with any interim questions, concerns, problems or updates. Thanks,   Windell Norfolk, MD

## 2024-01-03 DIAGNOSIS — A084 Viral intestinal infection, unspecified: Secondary | ICD-10-CM | POA: Diagnosis not present

## 2024-01-04 DIAGNOSIS — E039 Hypothyroidism, unspecified: Secondary | ICD-10-CM | POA: Diagnosis not present

## 2024-01-04 DIAGNOSIS — E559 Vitamin D deficiency, unspecified: Secondary | ICD-10-CM | POA: Diagnosis not present

## 2024-01-04 DIAGNOSIS — M51369 Other intervertebral disc degeneration, lumbar region without mention of lumbar back pain or lower extremity pain: Secondary | ICD-10-CM | POA: Diagnosis not present

## 2024-01-04 DIAGNOSIS — G309 Alzheimer's disease, unspecified: Secondary | ICD-10-CM | POA: Diagnosis not present

## 2024-01-04 DIAGNOSIS — R296 Repeated falls: Secondary | ICD-10-CM | POA: Diagnosis not present

## 2024-01-04 DIAGNOSIS — M6281 Muscle weakness (generalized): Secondary | ICD-10-CM | POA: Diagnosis not present

## 2024-01-04 DIAGNOSIS — M8580 Other specified disorders of bone density and structure, unspecified site: Secondary | ICD-10-CM | POA: Diagnosis not present

## 2024-01-04 DIAGNOSIS — I10 Essential (primary) hypertension: Secondary | ICD-10-CM | POA: Diagnosis not present

## 2024-01-04 DIAGNOSIS — M1711 Unilateral primary osteoarthritis, right knee: Secondary | ICD-10-CM | POA: Diagnosis not present

## 2024-01-09 ENCOUNTER — Telehealth: Payer: Self-pay | Admitting: Neurology

## 2024-01-09 NOTE — Telephone Encounter (Signed)
 Call to patient, she reports she thinks the Aricept is causing an upset stomach. She asks me to call caregiver to discuss, Victoria [phone]. She reports normal bowel movements but reports nausea.  She is not taking it with food and I advised to take with food and let us know if it doesn't improve. She was in agreement to follow up with patient and verbalized understanding.

## 2024-01-09 NOTE — Telephone Encounter (Signed)
 Pt called and LVM stating that the donepezil (ARICEPT) 5 MG tablet is upsetting her stomach and she no longer is taking it due to the side effects.Please advise what else can she take or do.

## 2024-01-10 DIAGNOSIS — M51369 Other intervertebral disc degeneration, lumbar region without mention of lumbar back pain or lower extremity pain: Secondary | ICD-10-CM | POA: Diagnosis not present

## 2024-01-10 DIAGNOSIS — E039 Hypothyroidism, unspecified: Secondary | ICD-10-CM | POA: Diagnosis not present

## 2024-01-10 DIAGNOSIS — M1711 Unilateral primary osteoarthritis, right knee: Secondary | ICD-10-CM | POA: Diagnosis not present

## 2024-01-10 DIAGNOSIS — R296 Repeated falls: Secondary | ICD-10-CM | POA: Diagnosis not present

## 2024-01-10 DIAGNOSIS — M6281 Muscle weakness (generalized): Secondary | ICD-10-CM | POA: Diagnosis not present

## 2024-01-10 DIAGNOSIS — M8580 Other specified disorders of bone density and structure, unspecified site: Secondary | ICD-10-CM | POA: Diagnosis not present

## 2024-01-10 DIAGNOSIS — I10 Essential (primary) hypertension: Secondary | ICD-10-CM | POA: Diagnosis not present

## 2024-01-10 DIAGNOSIS — E559 Vitamin D deficiency, unspecified: Secondary | ICD-10-CM | POA: Diagnosis not present

## 2024-01-10 DIAGNOSIS — G309 Alzheimer's disease, unspecified: Secondary | ICD-10-CM | POA: Diagnosis not present

## 2024-01-12 DIAGNOSIS — R197 Diarrhea, unspecified: Secondary | ICD-10-CM | POA: Diagnosis not present

## 2024-01-12 DIAGNOSIS — R110 Nausea: Secondary | ICD-10-CM | POA: Diagnosis not present

## 2024-01-12 DIAGNOSIS — R051 Acute cough: Secondary | ICD-10-CM | POA: Diagnosis not present

## 2024-01-17 DIAGNOSIS — M8580 Other specified disorders of bone density and structure, unspecified site: Secondary | ICD-10-CM | POA: Diagnosis not present

## 2024-01-17 DIAGNOSIS — E559 Vitamin D deficiency, unspecified: Secondary | ICD-10-CM | POA: Diagnosis not present

## 2024-01-17 DIAGNOSIS — I10 Essential (primary) hypertension: Secondary | ICD-10-CM | POA: Diagnosis not present

## 2024-01-17 DIAGNOSIS — E039 Hypothyroidism, unspecified: Secondary | ICD-10-CM | POA: Diagnosis not present

## 2024-01-17 DIAGNOSIS — M6281 Muscle weakness (generalized): Secondary | ICD-10-CM | POA: Diagnosis not present

## 2024-01-17 DIAGNOSIS — R296 Repeated falls: Secondary | ICD-10-CM | POA: Diagnosis not present

## 2024-01-17 DIAGNOSIS — G309 Alzheimer's disease, unspecified: Secondary | ICD-10-CM | POA: Diagnosis not present

## 2024-01-17 DIAGNOSIS — M51369 Other intervertebral disc degeneration, lumbar region without mention of lumbar back pain or lower extremity pain: Secondary | ICD-10-CM | POA: Diagnosis not present

## 2024-01-17 DIAGNOSIS — M1711 Unilateral primary osteoarthritis, right knee: Secondary | ICD-10-CM | POA: Diagnosis not present

## 2024-01-18 DIAGNOSIS — F331 Major depressive disorder, recurrent, moderate: Secondary | ICD-10-CM | POA: Diagnosis not present

## 2024-01-18 DIAGNOSIS — F411 Generalized anxiety disorder: Secondary | ICD-10-CM | POA: Diagnosis not present

## 2024-01-25 DIAGNOSIS — M6281 Muscle weakness (generalized): Secondary | ICD-10-CM | POA: Diagnosis not present

## 2024-01-25 DIAGNOSIS — E039 Hypothyroidism, unspecified: Secondary | ICD-10-CM | POA: Diagnosis not present

## 2024-01-25 DIAGNOSIS — M8580 Other specified disorders of bone density and structure, unspecified site: Secondary | ICD-10-CM | POA: Diagnosis not present

## 2024-01-25 DIAGNOSIS — I10 Essential (primary) hypertension: Secondary | ICD-10-CM | POA: Diagnosis not present

## 2024-01-25 DIAGNOSIS — E559 Vitamin D deficiency, unspecified: Secondary | ICD-10-CM | POA: Diagnosis not present

## 2024-01-25 DIAGNOSIS — M1711 Unilateral primary osteoarthritis, right knee: Secondary | ICD-10-CM | POA: Diagnosis not present

## 2024-01-25 DIAGNOSIS — M51369 Other intervertebral disc degeneration, lumbar region without mention of lumbar back pain or lower extremity pain: Secondary | ICD-10-CM | POA: Diagnosis not present

## 2024-01-25 DIAGNOSIS — R296 Repeated falls: Secondary | ICD-10-CM | POA: Diagnosis not present

## 2024-01-25 DIAGNOSIS — G309 Alzheimer's disease, unspecified: Secondary | ICD-10-CM | POA: Diagnosis not present

## 2024-01-26 DIAGNOSIS — F419 Anxiety disorder, unspecified: Secondary | ICD-10-CM | POA: Diagnosis not present

## 2024-01-26 DIAGNOSIS — F03A0 Unspecified dementia, mild, without behavioral disturbance, psychotic disturbance, mood disturbance, and anxiety: Secondary | ICD-10-CM | POA: Diagnosis not present

## 2024-01-27 DIAGNOSIS — E785 Hyperlipidemia, unspecified: Secondary | ICD-10-CM | POA: Diagnosis not present

## 2024-01-27 DIAGNOSIS — H9192 Unspecified hearing loss, left ear: Secondary | ICD-10-CM | POA: Diagnosis not present

## 2024-01-27 DIAGNOSIS — M1990 Unspecified osteoarthritis, unspecified site: Secondary | ICD-10-CM | POA: Diagnosis not present

## 2024-01-27 DIAGNOSIS — F02A3 Dementia in other diseases classified elsewhere, mild, with mood disturbance: Secondary | ICD-10-CM | POA: Diagnosis not present

## 2024-01-27 DIAGNOSIS — G309 Alzheimer's disease, unspecified: Secondary | ICD-10-CM | POA: Diagnosis not present

## 2024-01-27 DIAGNOSIS — M2040 Other hammer toe(s) (acquired), unspecified foot: Secondary | ICD-10-CM | POA: Diagnosis not present

## 2024-01-27 DIAGNOSIS — E039 Hypothyroidism, unspecified: Secondary | ICD-10-CM | POA: Diagnosis not present

## 2024-01-27 DIAGNOSIS — I700 Atherosclerosis of aorta: Secondary | ICD-10-CM | POA: Diagnosis not present

## 2024-01-27 DIAGNOSIS — Z85048 Personal history of other malignant neoplasm of rectum, rectosigmoid junction, and anus: Secondary | ICD-10-CM | POA: Diagnosis not present

## 2024-01-27 DIAGNOSIS — F209 Schizophrenia, unspecified: Secondary | ICD-10-CM | POA: Diagnosis not present

## 2024-01-27 DIAGNOSIS — Z79890 Hormone replacement therapy (postmenopausal): Secondary | ICD-10-CM | POA: Diagnosis not present

## 2024-01-27 DIAGNOSIS — L309 Dermatitis, unspecified: Secondary | ICD-10-CM | POA: Diagnosis not present

## 2024-01-27 DIAGNOSIS — I10 Essential (primary) hypertension: Secondary | ICD-10-CM | POA: Diagnosis not present

## 2024-01-27 DIAGNOSIS — M2010 Hallux valgus (acquired), unspecified foot: Secondary | ICD-10-CM | POA: Diagnosis not present

## 2024-01-27 DIAGNOSIS — F321 Major depressive disorder, single episode, moderate: Secondary | ICD-10-CM | POA: Diagnosis not present

## 2024-01-27 DIAGNOSIS — R32 Unspecified urinary incontinence: Secondary | ICD-10-CM | POA: Diagnosis not present

## 2024-02-03 DIAGNOSIS — F0284 Dementia in other diseases classified elsewhere, unspecified severity, with anxiety: Secondary | ICD-10-CM | POA: Diagnosis not present

## 2024-02-03 DIAGNOSIS — I10 Essential (primary) hypertension: Secondary | ICD-10-CM | POA: Diagnosis not present

## 2024-02-03 DIAGNOSIS — M51369 Other intervertebral disc degeneration, lumbar region without mention of lumbar back pain or lower extremity pain: Secondary | ICD-10-CM | POA: Diagnosis not present

## 2024-02-03 DIAGNOSIS — M1711 Unilateral primary osteoarthritis, right knee: Secondary | ICD-10-CM | POA: Diagnosis not present

## 2024-02-03 DIAGNOSIS — F0283 Dementia in other diseases classified elsewhere, unspecified severity, with mood disturbance: Secondary | ICD-10-CM | POA: Diagnosis not present

## 2024-02-03 DIAGNOSIS — G309 Alzheimer's disease, unspecified: Secondary | ICD-10-CM | POA: Diagnosis not present

## 2024-02-03 DIAGNOSIS — M8580 Other specified disorders of bone density and structure, unspecified site: Secondary | ICD-10-CM | POA: Diagnosis not present

## 2024-02-03 DIAGNOSIS — E039 Hypothyroidism, unspecified: Secondary | ICD-10-CM | POA: Diagnosis not present

## 2024-02-03 DIAGNOSIS — E559 Vitamin D deficiency, unspecified: Secondary | ICD-10-CM | POA: Diagnosis not present

## 2024-02-06 DIAGNOSIS — M51369 Other intervertebral disc degeneration, lumbar region without mention of lumbar back pain or lower extremity pain: Secondary | ICD-10-CM | POA: Diagnosis not present

## 2024-02-06 DIAGNOSIS — M8580 Other specified disorders of bone density and structure, unspecified site: Secondary | ICD-10-CM | POA: Diagnosis not present

## 2024-02-06 DIAGNOSIS — F0284 Dementia in other diseases classified elsewhere, unspecified severity, with anxiety: Secondary | ICD-10-CM | POA: Diagnosis not present

## 2024-02-06 DIAGNOSIS — G309 Alzheimer's disease, unspecified: Secondary | ICD-10-CM | POA: Diagnosis not present

## 2024-02-06 DIAGNOSIS — I10 Essential (primary) hypertension: Secondary | ICD-10-CM | POA: Diagnosis not present

## 2024-02-06 DIAGNOSIS — F0283 Dementia in other diseases classified elsewhere, unspecified severity, with mood disturbance: Secondary | ICD-10-CM | POA: Diagnosis not present

## 2024-02-06 DIAGNOSIS — E039 Hypothyroidism, unspecified: Secondary | ICD-10-CM | POA: Diagnosis not present

## 2024-02-06 DIAGNOSIS — M1711 Unilateral primary osteoarthritis, right knee: Secondary | ICD-10-CM | POA: Diagnosis not present

## 2024-02-06 DIAGNOSIS — E559 Vitamin D deficiency, unspecified: Secondary | ICD-10-CM | POA: Diagnosis not present

## 2024-02-13 DIAGNOSIS — H04123 Dry eye syndrome of bilateral lacrimal glands: Secondary | ICD-10-CM | POA: Diagnosis not present

## 2024-02-14 DIAGNOSIS — M1711 Unilateral primary osteoarthritis, right knee: Secondary | ICD-10-CM | POA: Diagnosis not present

## 2024-02-14 DIAGNOSIS — M51369 Other intervertebral disc degeneration, lumbar region without mention of lumbar back pain or lower extremity pain: Secondary | ICD-10-CM | POA: Diagnosis not present

## 2024-02-14 DIAGNOSIS — F0284 Dementia in other diseases classified elsewhere, unspecified severity, with anxiety: Secondary | ICD-10-CM | POA: Diagnosis not present

## 2024-02-14 DIAGNOSIS — M8580 Other specified disorders of bone density and structure, unspecified site: Secondary | ICD-10-CM | POA: Diagnosis not present

## 2024-02-14 DIAGNOSIS — E559 Vitamin D deficiency, unspecified: Secondary | ICD-10-CM | POA: Diagnosis not present

## 2024-02-14 DIAGNOSIS — E039 Hypothyroidism, unspecified: Secondary | ICD-10-CM | POA: Diagnosis not present

## 2024-02-14 DIAGNOSIS — G309 Alzheimer's disease, unspecified: Secondary | ICD-10-CM | POA: Diagnosis not present

## 2024-02-14 DIAGNOSIS — F0283 Dementia in other diseases classified elsewhere, unspecified severity, with mood disturbance: Secondary | ICD-10-CM | POA: Diagnosis not present

## 2024-02-14 DIAGNOSIS — I10 Essential (primary) hypertension: Secondary | ICD-10-CM | POA: Diagnosis not present

## 2024-02-20 DIAGNOSIS — M51369 Other intervertebral disc degeneration, lumbar region without mention of lumbar back pain or lower extremity pain: Secondary | ICD-10-CM | POA: Diagnosis not present

## 2024-02-20 DIAGNOSIS — E039 Hypothyroidism, unspecified: Secondary | ICD-10-CM | POA: Diagnosis not present

## 2024-02-20 DIAGNOSIS — M1711 Unilateral primary osteoarthritis, right knee: Secondary | ICD-10-CM | POA: Diagnosis not present

## 2024-02-20 DIAGNOSIS — I10 Essential (primary) hypertension: Secondary | ICD-10-CM | POA: Diagnosis not present

## 2024-02-20 DIAGNOSIS — G309 Alzheimer's disease, unspecified: Secondary | ICD-10-CM | POA: Diagnosis not present

## 2024-02-20 DIAGNOSIS — E559 Vitamin D deficiency, unspecified: Secondary | ICD-10-CM | POA: Diagnosis not present

## 2024-02-20 DIAGNOSIS — F0284 Dementia in other diseases classified elsewhere, unspecified severity, with anxiety: Secondary | ICD-10-CM | POA: Diagnosis not present

## 2024-02-20 DIAGNOSIS — F0283 Dementia in other diseases classified elsewhere, unspecified severity, with mood disturbance: Secondary | ICD-10-CM | POA: Diagnosis not present

## 2024-02-20 DIAGNOSIS — M8580 Other specified disorders of bone density and structure, unspecified site: Secondary | ICD-10-CM | POA: Diagnosis not present

## 2024-02-21 DIAGNOSIS — F411 Generalized anxiety disorder: Secondary | ICD-10-CM | POA: Diagnosis not present

## 2024-02-21 DIAGNOSIS — F331 Major depressive disorder, recurrent, moderate: Secondary | ICD-10-CM | POA: Diagnosis not present

## 2024-02-29 DIAGNOSIS — I10 Essential (primary) hypertension: Secondary | ICD-10-CM | POA: Diagnosis not present

## 2024-02-29 DIAGNOSIS — G309 Alzheimer's disease, unspecified: Secondary | ICD-10-CM | POA: Diagnosis not present

## 2024-02-29 DIAGNOSIS — E039 Hypothyroidism, unspecified: Secondary | ICD-10-CM | POA: Diagnosis not present

## 2024-02-29 DIAGNOSIS — M51369 Other intervertebral disc degeneration, lumbar region without mention of lumbar back pain or lower extremity pain: Secondary | ICD-10-CM | POA: Diagnosis not present

## 2024-02-29 DIAGNOSIS — M8580 Other specified disorders of bone density and structure, unspecified site: Secondary | ICD-10-CM | POA: Diagnosis not present

## 2024-02-29 DIAGNOSIS — F0284 Dementia in other diseases classified elsewhere, unspecified severity, with anxiety: Secondary | ICD-10-CM | POA: Diagnosis not present

## 2024-02-29 DIAGNOSIS — E559 Vitamin D deficiency, unspecified: Secondary | ICD-10-CM | POA: Diagnosis not present

## 2024-02-29 DIAGNOSIS — F0283 Dementia in other diseases classified elsewhere, unspecified severity, with mood disturbance: Secondary | ICD-10-CM | POA: Diagnosis not present

## 2024-02-29 DIAGNOSIS — F411 Generalized anxiety disorder: Secondary | ICD-10-CM | POA: Diagnosis not present

## 2024-02-29 DIAGNOSIS — M1711 Unilateral primary osteoarthritis, right knee: Secondary | ICD-10-CM | POA: Diagnosis not present

## 2024-02-29 DIAGNOSIS — F331 Major depressive disorder, recurrent, moderate: Secondary | ICD-10-CM | POA: Diagnosis not present

## 2024-03-06 DIAGNOSIS — F0283 Dementia in other diseases classified elsewhere, unspecified severity, with mood disturbance: Secondary | ICD-10-CM | POA: Diagnosis not present

## 2024-03-06 DIAGNOSIS — M51369 Other intervertebral disc degeneration, lumbar region without mention of lumbar back pain or lower extremity pain: Secondary | ICD-10-CM | POA: Diagnosis not present

## 2024-03-06 DIAGNOSIS — M1711 Unilateral primary osteoarthritis, right knee: Secondary | ICD-10-CM | POA: Diagnosis not present

## 2024-03-06 DIAGNOSIS — G309 Alzheimer's disease, unspecified: Secondary | ICD-10-CM | POA: Diagnosis not present

## 2024-03-06 DIAGNOSIS — E039 Hypothyroidism, unspecified: Secondary | ICD-10-CM | POA: Diagnosis not present

## 2024-03-06 DIAGNOSIS — I10 Essential (primary) hypertension: Secondary | ICD-10-CM | POA: Diagnosis not present

## 2024-03-06 DIAGNOSIS — F0284 Dementia in other diseases classified elsewhere, unspecified severity, with anxiety: Secondary | ICD-10-CM | POA: Diagnosis not present

## 2024-03-06 DIAGNOSIS — E559 Vitamin D deficiency, unspecified: Secondary | ICD-10-CM | POA: Diagnosis not present

## 2024-03-06 DIAGNOSIS — M8580 Other specified disorders of bone density and structure, unspecified site: Secondary | ICD-10-CM | POA: Diagnosis not present

## 2024-04-17 DIAGNOSIS — F411 Generalized anxiety disorder: Secondary | ICD-10-CM | POA: Diagnosis not present

## 2024-04-17 DIAGNOSIS — F331 Major depressive disorder, recurrent, moderate: Secondary | ICD-10-CM | POA: Diagnosis not present

## 2024-06-20 DIAGNOSIS — F411 Generalized anxiety disorder: Secondary | ICD-10-CM | POA: Diagnosis not present

## 2024-06-20 DIAGNOSIS — F331 Major depressive disorder, recurrent, moderate: Secondary | ICD-10-CM | POA: Diagnosis not present

## 2024-07-04 DIAGNOSIS — F03A0 Unspecified dementia, mild, without behavioral disturbance, psychotic disturbance, mood disturbance, and anxiety: Secondary | ICD-10-CM | POA: Diagnosis not present

## 2024-07-04 DIAGNOSIS — M8588 Other specified disorders of bone density and structure, other site: Secondary | ICD-10-CM | POA: Diagnosis not present

## 2024-07-04 DIAGNOSIS — F3341 Major depressive disorder, recurrent, in partial remission: Secondary | ICD-10-CM | POA: Diagnosis not present

## 2024-07-04 DIAGNOSIS — Z0000 Encounter for general adult medical examination without abnormal findings: Secondary | ICD-10-CM | POA: Diagnosis not present

## 2024-07-04 DIAGNOSIS — I700 Atherosclerosis of aorta: Secondary | ICD-10-CM | POA: Diagnosis not present

## 2024-07-04 DIAGNOSIS — E039 Hypothyroidism, unspecified: Secondary | ICD-10-CM | POA: Diagnosis not present

## 2024-07-04 DIAGNOSIS — E785 Hyperlipidemia, unspecified: Secondary | ICD-10-CM | POA: Diagnosis not present

## 2024-07-04 DIAGNOSIS — Z23 Encounter for immunization: Secondary | ICD-10-CM | POA: Diagnosis not present

## 2024-07-04 DIAGNOSIS — I10 Essential (primary) hypertension: Secondary | ICD-10-CM | POA: Diagnosis not present

## 2024-07-18 DIAGNOSIS — Z1231 Encounter for screening mammogram for malignant neoplasm of breast: Secondary | ICD-10-CM | POA: Diagnosis not present

## 2024-07-19 ENCOUNTER — Other Ambulatory Visit: Payer: Self-pay | Admitting: Neurology

## 2024-07-23 ENCOUNTER — Other Ambulatory Visit: Payer: Self-pay | Admitting: Neurology

## 2024-07-25 ENCOUNTER — Encounter: Payer: Self-pay | Admitting: Orthopedic Surgery

## 2024-07-25 ENCOUNTER — Ambulatory Visit: Admitting: Orthopedic Surgery

## 2024-07-25 DIAGNOSIS — M1712 Unilateral primary osteoarthritis, left knee: Secondary | ICD-10-CM

## 2024-07-25 MED ORDER — LIDOCAINE HCL 1 % IJ SOLN
5.0000 mL | INTRAMUSCULAR | Status: AC | PRN
  Administered 2024-07-25: 5 mL

## 2024-07-25 MED ORDER — TRIAMCINOLONE ACETONIDE 40 MG/ML IJ SUSP
40.0000 mg | INTRAMUSCULAR | Status: AC | PRN
  Administered 2024-07-25: 40 mg via INTRA_ARTICULAR

## 2024-07-25 MED ORDER — BUPIVACAINE HCL 0.25 % IJ SOLN
4.0000 mL | INTRAMUSCULAR | Status: AC | PRN
  Administered 2024-07-25: 4 mL via INTRA_ARTICULAR

## 2024-07-25 NOTE — Progress Notes (Signed)
 Office Visit Note   [name]           Date of Birth: 1946-07-24           MRN: [id number] Visit Date: 07/25/2024 Requested by: Teresa Channel, MD [phone] [address] [place],  [place] [address] PCP: Teresa Channel, MD  Subjective: Chief Complaint  Patient presents with   Left Knee - Pain    HPI: [name] is a 78 y.o. female who presents to the office reporting left knee pain.  Last seen about a year ago.  Had an injection with cortisone at that time which helped.  Still difficult for her to get up.  Takes Tylenol  for symptoms.  She does use a cane.  She had right knee replacement about 3 years ago..                ROS: All systems reviewed are negative as they relate to the chief complaint within the history of present illness.  Patient denies fevers or chills.  Assessment & Plan: Visit Diagnoses:  1. Arthritis of left knee     Plan: Impression is symptomatic left knee arthritis which is mild on plain radiographs from last year.  She does have a slight flexion contracture.  Cortisone injection indicated again today.  Will see how she does with that.  She will follow-up with us  as needed.  Follow-Up Instructions: No follow-ups on file.   Orders:  No orders of the defined types were placed in this encounter.  No orders of the defined types were placed in this encounter.     Procedures: Large Joint Inj: L knee on 07/25/2024 8:43 AM Indications: diagnostic evaluation, joint swelling and pain Details: 18 G 1.5 in needle, superolateral approach  Arthrogram: No  Medications: 5 mL lidocaine  1 %; 4 mL bupivacaine  0.25 %; 40 mg triamcinolone  acetonide 40 MG/ML Outcome: tolerated well, no immediate complications Procedure, treatment alternatives, risks and benefits explained, specific risks discussed. Consent was given by the patient. Immediately prior to procedure a time out was called to verify the correct patient, procedure, equipment, support staff  and site/side marked as required. Patient was prepped and draped in the usual sterile fashion.       Clinical Data: No additional findings.  Objective: Vital Signs: There were no vitals taken for this visit.  Physical Exam:  Constitutional: Patient appears well-developed HEENT:  Head: Normocephalic Eyes:EOM are normal Neck: Normal range of motion Cardiovascular: Normal rate Pulmonary/chest: Effort normal Neurologic: Patient is alert Skin: Skin is warm Psychiatric: Patient has normal mood and affect  Ortho Exam: Ortho exam demonstrates range of motion on the left of about 10-1 15.  No effusion.  Extensor mechanism intact and nontender.  Mild medial and less lateral joint line tenderness.  Some venous stasis changes are present bilateral lower extremities without much edema.  Specialty Comments:  No specialty comments available.  Imaging: No results found.   PMFS History: Patient Active Problem List   Diagnosis Date Noted   E coli bacteremia    Acute lower UTI    Sepsis due to undetermined organism (HCC) 09/19/2021   Hypokalemia 09/19/2021   Leukopenia 09/19/2021   Prolonged QT interval 09/19/2021   Arthritis of right knee    UTI (urinary tract infection) 06/26/2021   Severe sepsis (HCC) 06/25/2021   Acute metabolic encephalopathy 06/25/2021   Hypothyroid 06/25/2021   S/P total knee arthroplasty, right 06/09/2021   Fever    Staphylococcus aureus  bacteremia with sepsis (HCC)    SIRS (systemic inflammatory response syndrome) (HCC) 11/30/2020   Lumbar radiculopathy 10/10/2019   Bilateral stenosis of lateral recess of lumbar spine 10/10/2019   Hemorrhoids, external without complications 06/17/2014   Depression    Anxiety    Rectal bleeding    History of radiation therapy    Malignant neoplasm of anal canal (HCC) 11/15/2011   Hemorrhoids, internal, with bleeding 09/07/2011   Acquired anal stenosis 09/07/2011   Hyperlipidemia 09/07/2011   Family history of breast  cancer in sister 09/07/2011   Past Medical History:  Diagnosis Date   anal ca dx'd 11/2011   squamous cell carcinoma   Anxiety    Arthritis 2022   Depression    Full dentures    Hemorrhoids    external   History of radiation therapy 11/29/11 to 01/05/12   anal canal 5040 cGy 28 sessions, regional lymph nodes 4200 cGy 28 sessions   Hyperlipidemia    Lumbar radiculopathy 10/10/2019   Rectal bleeding    Staphylococcus aureus bacteremia with sepsis (HCC)    UTI (urinary tract infection) 06/26/2021   Vitamin D deficiency    Wears glasses     Family History  Problem Relation Age of Onset   Cancer Sister        breast/ age 66   Cervical cancer Sister        47   Alcohol abuse Maternal Uncle     Past Surgical History:  Procedure Laterality Date   ABDOMINAL HYSTERECTOMY  1978   age 45   BUBBLE STUDY  12/04/2020   Procedure: BUBBLE STUDY;  Surgeon: Loni Soyla LABOR, MD;  Location: [facility] ENDOSCOPY;  Service: Cardiology;;   EXAMINATION UNDER ANESTHESIA  06/21/2012   Procedure: EXAM UNDER ANESTHESIA;  Surgeon: Elon CHRISTELLA Pacini, MD;  Location: [facility];  Service: General;  Laterality: N/A;  rectal exam under anesthesia, anal dilation, rectal biopsy   HEMORRHOID SURGERY  1980   Patient had to guess on the date.   RECTAL BIOPSY  06/21/2012   Procedure: BIOPSY RECTAL;  Surgeon: Elon CHRISTELLA Pacini, MD;  Location: [facility];  Service: General;  Laterality: N/A;   TEE WITHOUT CARDIOVERSION N/A 12/04/2020   Procedure: TRANSESOPHAGEAL ECHOCARDIOGRAM (TEE);  Surgeon: Loni Soyla LABOR, MD;  Location: [facility] ENDOSCOPY;  Service: Cardiology;  Laterality: N/A;   TOTAL KNEE ARTHROPLASTY Right 06/09/2021   Procedure: RIGHT TOTAL KNEE ARTHROPLASTY;  Surgeon: Addie Cordella Hamilton, MD;  Location: [facility] OR;  Service: Orthopedics;  Laterality: Right;   TUBAL LIGATION     b/l  age 78   Social History   Occupational History   Not on file  Tobacco Use   Smoking status: Former    Current  packs/day: 0.00    Average packs/day: 0.5 packs/day for 25.0 years (12.5 ttl pk-yrs)    Types: Cigarettes    Start date: 09/06/1962    Quit date: 09/07/1987    Years since quitting: 36.9   Smokeless tobacco: Never  Vaping Use   Vaping status: Never Used  Substance and Sexual Activity   Alcohol use: No   Drug use: No   Sexual activity: Not Currently

## 2024-08-15 DIAGNOSIS — F331 Major depressive disorder, recurrent, moderate: Secondary | ICD-10-CM | POA: Diagnosis not present

## 2024-08-15 DIAGNOSIS — F411 Generalized anxiety disorder: Secondary | ICD-10-CM | POA: Diagnosis not present

## 2024-09-26 DIAGNOSIS — M1712 Unilateral primary osteoarthritis, left knee: Secondary | ICD-10-CM | POA: Diagnosis not present

## 2024-09-26 DIAGNOSIS — G301 Alzheimer's disease with late onset: Secondary | ICD-10-CM | POA: Diagnosis not present

## 2024-09-26 DIAGNOSIS — M8588 Other specified disorders of bone density and structure, other site: Secondary | ICD-10-CM | POA: Diagnosis not present

## 2024-09-26 DIAGNOSIS — F03A0 Unspecified dementia, mild, without behavioral disturbance, psychotic disturbance, mood disturbance, and anxiety: Secondary | ICD-10-CM | POA: Diagnosis not present

## 2024-09-26 DIAGNOSIS — F3341 Major depressive disorder, recurrent, in partial remission: Secondary | ICD-10-CM | POA: Diagnosis not present

## 2024-09-26 DIAGNOSIS — R296 Repeated falls: Secondary | ICD-10-CM | POA: Diagnosis not present

## 2024-09-26 DIAGNOSIS — I10 Essential (primary) hypertension: Secondary | ICD-10-CM | POA: Diagnosis not present

## 2024-09-26 DIAGNOSIS — G479 Sleep disorder, unspecified: Secondary | ICD-10-CM | POA: Diagnosis not present

## 2024-09-26 DIAGNOSIS — M47816 Spondylosis without myelopathy or radiculopathy, lumbar region: Secondary | ICD-10-CM | POA: Diagnosis not present

## 2024-10-26 ENCOUNTER — Inpatient Hospital Stay (HOSPITAL_COMMUNITY)
Admission: EM | Admit: 2024-10-26 | Discharge: 2024-11-01 | DRG: 871 | Disposition: A | Discharge status: Skilled Nursing Facility | Attending: Internal Medicine | Admitting: Internal Medicine

## 2024-10-26 ENCOUNTER — Encounter (HOSPITAL_COMMUNITY): Payer: Self-pay

## 2024-10-26 ENCOUNTER — Emergency Department (HOSPITAL_COMMUNITY)

## 2024-10-26 ENCOUNTER — Other Ambulatory Visit: Payer: Self-pay

## 2024-10-26 DIAGNOSIS — Z87891 Personal history of nicotine dependence: Secondary | ICD-10-CM | POA: Diagnosis not present

## 2024-10-26 DIAGNOSIS — E039 Hypothyroidism, unspecified: Secondary | ICD-10-CM | POA: Diagnosis present

## 2024-10-26 DIAGNOSIS — A419 Sepsis, unspecified organism: Principal | ICD-10-CM | POA: Diagnosis present

## 2024-10-26 DIAGNOSIS — I10 Essential (primary) hypertension: Secondary | ICD-10-CM | POA: Diagnosis present

## 2024-10-26 DIAGNOSIS — E872 Acidosis, unspecified: Secondary | ICD-10-CM | POA: Diagnosis present

## 2024-10-26 DIAGNOSIS — E785 Hyperlipidemia, unspecified: Secondary | ICD-10-CM | POA: Diagnosis present

## 2024-10-26 DIAGNOSIS — G9341 Metabolic encephalopathy: Secondary | ICD-10-CM | POA: Diagnosis present

## 2024-10-26 DIAGNOSIS — R531 Weakness: Principal | ICD-10-CM

## 2024-10-26 DIAGNOSIS — Z79899 Other long term (current) drug therapy: Secondary | ICD-10-CM | POA: Diagnosis not present

## 2024-10-26 DIAGNOSIS — R296 Repeated falls: Secondary | ICD-10-CM | POA: Diagnosis present

## 2024-10-26 DIAGNOSIS — Z85048 Personal history of other malignant neoplasm of rectum, rectosigmoid junction, and anus: Secondary | ICD-10-CM | POA: Diagnosis not present

## 2024-10-26 DIAGNOSIS — E876 Hypokalemia: Secondary | ICD-10-CM | POA: Diagnosis not present

## 2024-10-26 DIAGNOSIS — R6521 Severe sepsis with septic shock: Secondary | ICD-10-CM | POA: Diagnosis present

## 2024-10-26 DIAGNOSIS — R197 Diarrhea, unspecified: Secondary | ICD-10-CM | POA: Diagnosis not present

## 2024-10-26 DIAGNOSIS — Z923 Personal history of irradiation: Secondary | ICD-10-CM

## 2024-10-26 DIAGNOSIS — Z7983 Long term (current) use of bisphosphonates: Secondary | ICD-10-CM

## 2024-10-26 DIAGNOSIS — E870 Hyperosmolality and hypernatremia: Secondary | ICD-10-CM | POA: Diagnosis present

## 2024-10-26 DIAGNOSIS — Z87440 Personal history of urinary (tract) infections: Secondary | ICD-10-CM | POA: Diagnosis not present

## 2024-10-26 DIAGNOSIS — Z96651 Presence of right artificial knee joint: Secondary | ICD-10-CM | POA: Diagnosis present

## 2024-10-26 DIAGNOSIS — T3695XA Adverse effect of unspecified systemic antibiotic, initial encounter: Secondary | ICD-10-CM | POA: Diagnosis not present

## 2024-10-26 DIAGNOSIS — F419 Anxiety disorder, unspecified: Secondary | ICD-10-CM | POA: Diagnosis present

## 2024-10-26 DIAGNOSIS — Z90710 Acquired absence of both cervix and uterus: Secondary | ICD-10-CM

## 2024-10-26 DIAGNOSIS — F03A4 Unspecified dementia, mild, with anxiety: Secondary | ICD-10-CM | POA: Diagnosis present

## 2024-10-26 DIAGNOSIS — F05 Delirium due to known physiological condition: Secondary | ICD-10-CM | POA: Diagnosis not present

## 2024-10-26 DIAGNOSIS — F32A Depression, unspecified: Secondary | ICD-10-CM | POA: Diagnosis present

## 2024-10-26 DIAGNOSIS — J189 Pneumonia, unspecified organism: Secondary | ICD-10-CM | POA: Diagnosis present

## 2024-10-26 DIAGNOSIS — Z79890 Hormone replacement therapy (postmenopausal): Secondary | ICD-10-CM | POA: Diagnosis not present

## 2024-10-26 DIAGNOSIS — C211 Malignant neoplasm of anal canal: Secondary | ICD-10-CM | POA: Diagnosis present

## 2024-10-26 LAB — CBC WITH DIFFERENTIAL/PLATELET
Abs Immature Granulocytes: 0.13 K/uL — ABNORMAL HIGH (ref 0.00–0.07)
Basophils Absolute: 0.1 K/uL (ref 0.0–0.1)
Basophils Relative: 0 %
Eosinophils Absolute: 0 K/uL (ref 0.0–0.5)
Eosinophils Relative: 0 %
HCT: 44.3 % (ref 36.0–46.0)
Hemoglobin: 14 g/dL (ref 12.0–15.0)
Immature Granulocytes: 1 %
Lymphocytes Relative: 3 %
Lymphs Abs: 0.6 K/uL — ABNORMAL LOW (ref 0.7–4.0)
MCH: 33 pg (ref 26.0–34.0)
MCHC: 31.6 g/dL (ref 30.0–36.0)
MCV: 104.5 fL — ABNORMAL HIGH (ref 80.0–100.0)
Monocytes Absolute: 0.7 K/uL (ref 0.1–1.0)
Monocytes Relative: 5 %
Neutro Abs: 14.6 K/uL — ABNORMAL HIGH (ref 1.7–7.7)
Neutrophils Relative %: 91 %
Platelets: 181 K/uL (ref 150–400)
RBC: 4.24 MIL/uL (ref 3.87–5.11)
RDW: 13.2 % (ref 11.5–15.5)
WBC: 16.1 K/uL — ABNORMAL HIGH (ref 4.0–10.5)
nRBC: 0 % (ref 0.0–0.2)

## 2024-10-26 LAB — PROTIME-INR
INR: 1 (ref 0.8–1.2)
Prothrombin Time: 13.9 s (ref 11.4–15.2)

## 2024-10-26 LAB — COMPREHENSIVE METABOLIC PANEL WITH GFR
ALT: 25 U/L (ref 0–44)
AST: 37 U/L (ref 15–41)
Albumin: 4.7 g/dL (ref 3.5–5.0)
Alkaline Phosphatase: 69 U/L (ref 38–126)
Anion gap: 14 (ref 5–15)
BUN: 11 mg/dL (ref 8–23)
CO2: 26 mmol/L (ref 22–32)
Calcium: 9.5 mg/dL (ref 8.9–10.3)
Chloride: 101 mmol/L (ref 98–111)
Creatinine, Ser: 0.81 mg/dL (ref 0.44–1.00)
GFR, Estimated: >60 mL/min
Glucose, Bld: 91 mg/dL (ref 70–99)
Potassium: 4.2 mmol/L (ref 3.5–5.1)
Sodium: 141 mmol/L (ref 135–145)
Total Bilirubin: 0.5 mg/dL (ref 0.0–1.2)
Total Protein: 7.6 g/dL (ref 6.5–8.1)

## 2024-10-26 LAB — CBC
HCT: 40.2 % (ref 36.0–46.0)
Hemoglobin: 13.5 g/dL (ref 12.0–15.0)
MCH: 33.3 pg (ref 26.0–34.0)
MCHC: 33.6 g/dL (ref 30.0–36.0)
MCV: 99 fL (ref 80.0–100.0)
Platelets: 190 K/uL (ref 150–400)
RBC: 4.06 MIL/uL (ref 3.87–5.11)
RDW: 13.6 % (ref 11.5–15.5)
WBC: 19.1 K/uL — ABNORMAL HIGH (ref 4.0–10.5)
nRBC: 0 % (ref 0.0–0.2)

## 2024-10-26 LAB — URINALYSIS, W/ REFLEX TO CULTURE (INFECTION SUSPECTED)
Bacteria, UA: NONE SEEN
Bilirubin Urine: NEGATIVE
Glucose, UA: NEGATIVE mg/dL
Hgb urine dipstick: NEGATIVE
Ketones, ur: NEGATIVE mg/dL
Leukocytes,Ua: NEGATIVE
Nitrite: NEGATIVE
Protein, ur: NEGATIVE mg/dL
Specific Gravity, Urine: 1.012 (ref 1.005–1.030)
pH: 5 (ref 5.0–8.0)

## 2024-10-26 LAB — TROPONIN T, HIGH SENSITIVITY
Troponin T High Sensitivity: 20 ng/L — ABNORMAL HIGH (ref 0–19)
Troponin T High Sensitivity: 22 ng/L — ABNORMAL HIGH (ref 0–19)

## 2024-10-26 LAB — CREATININE, SERUM
Creatinine, Ser: 0.88 mg/dL (ref 0.44–1.00)
GFR, Estimated: >60 mL/min

## 2024-10-26 LAB — LIPASE, BLOOD: Lipase: 20 U/L (ref 11–51)

## 2024-10-26 LAB — RESP PANEL BY RT-PCR (RSV, FLU A&B, COVID)  RVPGX2
Influenza A by PCR: NEGATIVE
Influenza B by PCR: NEGATIVE
Resp Syncytial Virus by PCR: NEGATIVE
SARS Coronavirus 2 by RT PCR: NEGATIVE

## 2024-10-26 LAB — I-STAT CG4 LACTIC ACID, ED: Lactic Acid, Venous: 4.1 mmol/L (ref 0.5–1.9)

## 2024-10-26 LAB — LACTIC ACID, PLASMA: Lactic Acid, Venous: 5.4 mmol/L (ref 0.5–1.9)

## 2024-10-26 MED ORDER — SODIUM CHLORIDE 0.9 % IV SOLN
2.0000 g | Freq: Once | INTRAVENOUS | Status: AC
  Administered 2024-10-26: 2 g via INTRAVENOUS
  Filled 2024-10-26: qty 12.5

## 2024-10-26 MED ORDER — SODIUM CHLORIDE 0.9 % IV SOLN
2.0000 g | INTRAVENOUS | Status: AC
  Administered 2024-10-26 – 2024-10-30 (×5): 2 g via INTRAVENOUS
  Filled 2024-10-26 (×4): qty 20

## 2024-10-26 MED ORDER — IOHEXOL 350 MG/ML SOLN
75.0000 mL | Freq: Once | INTRAVENOUS | Status: AC | PRN
  Administered 2024-10-26: 75 mL via INTRAVENOUS

## 2024-10-26 MED ORDER — IPRATROPIUM-ALBUTEROL 0.5-2.5 (3) MG/3ML IN SOLN
3.0000 mL | RESPIRATORY_TRACT | Status: DC | PRN
  Administered 2024-10-26 – 2024-10-29 (×4): 3 mL via RESPIRATORY_TRACT
  Filled 2024-10-26 (×4): qty 3

## 2024-10-26 MED ORDER — LACTATED RINGERS IV SOLN: INTRAVENOUS | Status: AC

## 2024-10-26 MED ORDER — SODIUM CHLORIDE 0.9 % IV SOLN
100.0000 mg | Freq: Two times a day (BID) | INTRAVENOUS | Status: DC
  Administered 2024-10-26: 100 mg via INTRAVENOUS
  Filled 2024-10-26 (×3): qty 100

## 2024-10-26 MED ORDER — ACETAMINOPHEN 650 MG RE SUPP: 650.0000 mg | Freq: Four times a day (QID) | RECTAL | Status: DC | PRN

## 2024-10-26 MED ORDER — LACTATED RINGERS IV BOLUS
1300.0000 mL | Freq: Once | INTRAVENOUS | Status: AC
  Administered 2024-10-26: 1300 mL via INTRAVENOUS

## 2024-10-26 MED ORDER — ACETAMINOPHEN 325 MG PO TABS
650.0000 mg | ORAL_TABLET | Freq: Four times a day (QID) | ORAL | Status: DC | PRN
  Administered 2024-10-26 – 2024-10-31 (×5): 650 mg via ORAL
  Filled 2024-10-26 (×2): qty 2

## 2024-10-26 MED ORDER — LACTATED RINGERS IV SOLN: 150.0000 mL/h | INTRAVENOUS | Status: DC

## 2024-10-26 MED ORDER — VANCOMYCIN HCL IN DEXTROSE 1-5 GM/200ML-% IV SOLN: 1000.0000 mg | Freq: Once | INTRAVENOUS | Status: DC

## 2024-10-26 MED ORDER — METRONIDAZOLE 500 MG/100ML IV SOLN
500.0000 mg | Freq: Once | INTRAVENOUS | Status: AC
  Administered 2024-10-26: 500 mg via INTRAVENOUS
  Filled 2024-10-26: qty 100

## 2024-10-26 MED ORDER — VANCOMYCIN HCL 1500 MG/300ML IV SOLN
1500.0000 mg | Freq: Once | INTRAVENOUS | Status: AC
  Administered 2024-10-26: 1500 mg via INTRAVENOUS
  Filled 2024-10-26: qty 300

## 2024-10-26 MED ORDER — ENOXAPARIN SODIUM 40 MG/0.4ML IJ SOSY
40.0000 mg | PREFILLED_SYRINGE | INTRAMUSCULAR | Status: DC
  Administered 2024-10-26 – 2024-10-31 (×6): 40 mg via SUBCUTANEOUS
  Filled 2024-10-26 (×4): qty 0.4

## 2024-10-26 MED ORDER — PROMETHAZINE HCL 12.5 MG PO TABS: 12.5000 mg | ORAL_TABLET | Freq: Four times a day (QID) | ORAL | Status: DC | PRN

## 2024-10-26 MED ORDER — LACTATED RINGERS IV BOLUS (SEPSIS)
1000.0000 mL | Freq: Once | INTRAVENOUS | Status: AC
  Administered 2024-10-26: 1000 mL via INTRAVENOUS

## 2024-10-26 NOTE — Progress Notes (Signed)
 Elink is following code sepsis.

## 2024-10-26 NOTE — ED Notes (Signed)
 IV team unable to obtain all blood work.  IV Team was able not able to get Blood cultures at this time. Will request Phlebotomy to obtain [facility] and [employer].  EDMD notified.

## 2024-10-26 NOTE — Sepsis Progress Note (Signed)
 Notified bedside nurse of need to draw repeat lactic acid.

## 2024-10-26 NOTE — H&P (Signed)
 " History and Physical    [name] FMW:[id number] DOB: 1946/06/11 DOA: 10/26/2024 DOS: the patient was seen and examined on 10/26/2024 PCP: Teresa Channel, MD  Patient coming from: Home  Chief Complaint:  Chief Complaint  Patient presents with   Weakness   Urinary Frequency   Nausea   HPI: [name] is a 78 y.o. female with medical history significant of squamous cell carcinoma of the anus, hyperlipidemia, anxiety disorder, depression, history of previous bacteremias including staph and E. coli, recurrent UTIs who presented to the ER with generalized weakness, nausea and frequent urination since last night.  She denied any subjective fever or chills but objectively she was found to be febrile.  Patient also met sepsis criteria with evidence of pneumonia and lactic acidosis.  Denied Sick contacts.  Respiratory screen is negative for flu COVID-19 or RSV.  Patient is being admitted with sepsis due to multifocal pneumonia.  She is hemodynamically stable.  Mildly elevated troponin otherwise.  Lactic acid is 4.1.   Review of Systems: As mentioned in the history of present illness. All other systems reviewed and are negative. Past Medical History:  Diagnosis Date   anal ca dx'd 11/2011   squamous cell carcinoma   Anxiety    Arthritis 2022   Depression    Full dentures    Hemorrhoids    external   History of radiation therapy 11/29/11 to 01/05/12   anal canal 5040 cGy 28 sessions, regional lymph nodes 4200 cGy 28 sessions   Hyperlipidemia    Lumbar radiculopathy 10/10/2019   Rectal bleeding    Staphylococcus aureus bacteremia with sepsis (HCC)    UTI (urinary tract infection) 06/26/2021   Vitamin D  deficiency    Wears glasses    Past Surgical History:  Procedure Laterality Date   ABDOMINAL HYSTERECTOMY  1978   age 27   BUBBLE STUDY  12/04/2020   Procedure: BUBBLE STUDY;  Surgeon: Loni Soyla LABOR, MD;  Location: [facility] ENDOSCOPY;  Service: Cardiology;;   EXAMINATION  UNDER ANESTHESIA  06/21/2012   Procedure: EXAM UNDER ANESTHESIA;  Surgeon: Elon CHRISTELLA Pacini, MD;  Location: [facility];  Service: General;  Laterality: N/A;  rectal exam under anesthesia, anal dilation, rectal biopsy   HEMORRHOID SURGERY  1980   Patient had to guess on the date.   RECTAL BIOPSY  06/21/2012   Procedure: BIOPSY RECTAL;  Surgeon: Elon CHRISTELLA Pacini, MD;  Location:  SURGERY CENTER;  Service: General;  Laterality: N/A;   TEE WITHOUT CARDIOVERSION N/A 12/04/2020   Procedure: TRANSESOPHAGEAL ECHOCARDIOGRAM (TEE);  Surgeon: Loni Soyla LABOR, MD;  Location: [facility] ENDOSCOPY;  Service: Cardiology;  Laterality: N/A;   TOTAL KNEE ARTHROPLASTY Right 06/09/2021   Procedure: RIGHT TOTAL KNEE ARTHROPLASTY;  Surgeon: Addie Cordella Hamilton, MD;  Location: [facility] OR;  Service: Orthopedics;  Laterality: Right;   TUBAL LIGATION     b/l  age 28   Social History:  reports that she quit smoking about 37 years ago. Her smoking use included cigarettes. She started smoking about 62 years ago. She has a 12.5 pack-year smoking history. She has never used smokeless tobacco. She reports that she does not drink alcohol and does not use drugs.  Allergies[1]  Family History  Problem Relation Age of Onset   Cancer Sister        breast/ age 58   Cervical cancer Sister        70   Alcohol abuse Maternal Uncle  Prior to Admission medications  Medication Sig Start Date End Date Taking? Authorizing Provider  acetaminophen  (TYLENOL ) 650 MG CR tablet Take 650 mg by mouth every 8 (eight) hours as needed for pain.    [provider]  acetic acid-hydrocortisone (VOSOL-HC) OTIC solution     [provider]  alendronate (FOSAMAX) 70 MG tablet Take 70 mg by mouth once a week. Patient not taking: Reported on 12/26/2023 04/21/23   [provider]  BIOTIN PO Take by mouth.    [provider]  cholecalciferol  (VITAMIN D3) 25 MCG (1000 UNIT) tablet Take 1,000 Units by mouth  daily.    [provider]  Cranberry-Vitamin C (AZO CRANBERRY URINARY TRACT PO) Take by mouth.    [provider]  diclofenac  Sodium (VOLTAREN ) 1 % GEL     [provider]  donepezil  (ARICEPT ) 5 MG tablet TAKE 1 TABLET(5 MG) BY MOUTH AT BEDTIME 07/23/24   Camara, Amadou, MD  DULoxetine  (CYMBALTA ) 60 MG capsule Take 120 mg by mouth daily.    [provider]  furosemide  (LASIX ) 40 MG tablet Take 40 mg by mouth daily.    [provider]  gabapentin  (NEURONTIN ) 600 MG tablet Take 600 mg by mouth 2 (two) times daily. 07/14/16   [provider]  levothyroxine  (SYNTHROID , LEVOTHROID) 25 MCG tablet Take 25 mcg by mouth daily before breakfast. 08/29/18   [provider]  Methylcobalamin 1000 MCG TBDP Take 1,000 mcg by mouth daily.    [provider]  Multiple Vitamins-Minerals (CENTRUM MINIS WOMEN 50+) TABS Take by mouth.    [provider]  Omega-3 Fatty Acids (FISH OIL) 1200 MG CAPS Take by mouth.    [provider]  potassium chloride  (KLOR-CON ) 10 MEQ tablet Take 10 mEq by mouth 2 (two) times daily. 06/24/22   [provider]  Turmeric 500 MG CAPS Take 500 mg by mouth daily.    [provider]  valsartan (DIOVAN) 160 MG tablet Take 160 mg by mouth daily. 06/20/23   [provider]    Physical Exam: Vitals:   10/26/24 1538 10/26/24 1545 10/26/24 1615 10/26/24 1815  BP:  (!) 156/69 (!) 152/75   Pulse:   (!) 106   Resp:  (!) 28 (!) 25   Temp: (!) 102.9 F (39.4 C)   (!) 103.1 F (39.5 C)  TempSrc: Oral   Oral  SpO2:   96%   Weight:      Height:       Constitutional: Acutely ill looking, NAD, calm, comfortable Eyes: PERRL, lids and conjunctivae normal ENMT: Mucous membranes are dry posterior pharynx clear of any exudate or lesions.Normal dentition.  Neck: normal, supple, no masses, no thyromegaly Respiratory: Coarse breath sounds, no wheezing, no crackles. Normal respiratory  effort. No accessory muscle use.  Cardiovascular: Sinus tachycardia, no murmurs / rubs / gallops. No extremity edema. 2+ pedal pulses. No carotid bruits.  Abdomen: no tenderness, no masses palpated. No hepatosplenomegaly. Bowel sounds positive.  Musculoskeletal: Good range of motion, no joint swelling or tenderness, Skin: no rashes, lesions, ulcers. No induration Neurologic: CN 2-12 grossly intact. Sensation intact, DTR normal. Strength 5/5 in all 4.  Psychiatric: Normal judgment and insight. Alert and oriented x 3. Normal mood  Data Reviewed:  Temperature 103.1, blood pressure 150/51, pulse 108 respirate 29 O2 sat 96% on room air.  White count 16.1.  Lactic acid 4.1.  Troponin initially 20.  Acute viral screen is negative for flu RSV and COVID-19.  Urinalysis essentially negative.  Head CT without contrast is negative CT chest abdomen pelvis showed patchy nodular airspace opacity in the anterobasal left lower lobe suspicious for bronchopneumonia.  Chest x-ray showed no acute findings but elevated right hemidiaphragm  Assessment and Plan:  #1 sepsis due to pneumonia: Patient will be admitted to a monitored bed.  Obtain blood cultures.  Aggressive hydration.  Follow sepsis protocol with lactic acid monitoring.  Initiate IV Rocephin  as doxycycline  due to prolonged QTc.  Follow closely.  #2 hyperlipidemia: Confirm on resume statin  #3 history of malignant neoplasm of anal canal:  in remission.  Continue monitoring  #4 depression with anxiety: Confirm on resume home regimen.  #5 hypothyroidism: Continue levothyroxine .  #6 history of previous bacteremias: Follow-up blood culture closely.    Advance Care Planning:   Code Status: Prior full code  Consults: None  Family Communication: No family at bedside  Severity of Illness: The appropriate patient status for this patient is INPATIENT. Inpatient status is judged to be reasonable and necessary in order to provide the required intensity of  service to ensure the patient's safety. The patient's presenting symptoms, physical exam findings, and initial radiographic and laboratory data in the context of their chronic comorbidities is felt to place them at high risk for further clinical deterioration. Furthermore, it is not anticipated that the patient will be medically stable for discharge from the hospital within 2 midnights of admission.   * I certify that at the point of admission it is my clinical judgment that the patient will require inpatient hospital care spanning beyond 2 midnights from the point of admission due to high intensity of service, high risk for further deterioration and high frequency of surveillance required.*  AuthorBETHA SIM KNOLL, MD 10/26/2024 6:18 PM  For on call review [email or web address].      [1] No Known Allergies  "

## 2024-10-26 NOTE — ED Notes (Signed)
 Phlebotomy unable to obtain an Blood work or blood cultures. EDMD notified.

## 2024-10-26 NOTE — ED Provider Notes (Signed)
 " [place] [facility] DEPARTMENT AT [facility] Provider Note   CSN: [id number] Arrival date & time: 10/26/24  1059     Patient presents with: Weakness, Urinary Frequency, and Nausea   [name] is a 78 y.o. female.  She is a carrier of rectal cancer.  Lives at home alone although family checks on her.  Walks with a walker.  She said she has been very weak and shaky and unable to ambulate since yesterday.  Associated with headache and urinary frequency.  Diarrhea.  No chest pain or shortness of breath no abdominal pain nausea or vomiting.  She does not know if she has had a fever.  Family friend brought her in today for evaluation.  She fell a few weeks ago.   The history is provided by the patient.  Weakness Severity:  Severe Onset quality:  Gradual Duration:  2 days Timing:  Constant Progression:  Worsening Chronicity:  New Relieved by:  Nothing Worsened by:  Activity Ineffective treatments:  Rest Associated symptoms: diarrhea, difficulty walking, dizziness, falls, frequency and headaches   Associated symptoms: no abdominal pain, no chest pain, no cough, no dysuria, no fever, no nausea, no shortness of breath and no vomiting   Urinary Frequency Associated symptoms include headaches. Pertinent negatives include no chest pain, no abdominal pain and no shortness of breath.       Prior to Admission medications  Medication Sig Start Date End Date Taking? Authorizing Provider  acetaminophen  (TYLENOL ) 650 MG CR tablet Take 650 mg by mouth every 8 (eight) hours as needed for pain.    [provider]  acetic acid-hydrocortisone (VOSOL-HC) OTIC solution     [provider]  alendronate (FOSAMAX) 70 MG tablet Take 70 mg by mouth once a week. Patient not taking: Reported on 12/26/2023 04/21/23   [provider]  BIOTIN PO Take by mouth.    [provider]  cholecalciferol  (VITAMIN D3) 25 MCG (1000 UNIT) tablet Take 1,000 Units by mouth  daily.    [provider]  Cranberry-Vitamin C (AZO CRANBERRY URINARY TRACT PO) Take by mouth.    [provider]  diclofenac  Sodium (VOLTAREN ) 1 % GEL     [provider]  donepezil  (ARICEPT ) 5 MG tablet TAKE 1 TABLET(5 MG) BY MOUTH AT BEDTIME 07/23/24   Gregg Lek, MD  DULoxetine  (CYMBALTA ) 60 MG capsule Take 120 mg by mouth daily.    [provider]  furosemide  (LASIX ) 40 MG tablet Take 40 mg by mouth daily.    [provider]  gabapentin  (NEURONTIN ) 600 MG tablet Take 600 mg by mouth 2 (two) times daily. 07/14/16   [provider]  levothyroxine  (SYNTHROID , LEVOTHROID) 25 MCG tablet Take 25 mcg by mouth daily before breakfast. 08/29/18   [provider]  Methylcobalamin 1000 MCG TBDP Take 1,000 mcg by mouth daily.    [provider]  Multiple Vitamins-Minerals (CENTRUM MINIS WOMEN 50+) TABS Take by mouth.    [provider]  Omega-3 Fatty Acids (FISH OIL) 1200 MG CAPS Take by mouth.    [provider]  potassium chloride  (KLOR-CON ) 10 MEQ tablet Take 10 mEq by mouth 2 (two) times daily. 06/24/22   [provider]  Turmeric 500 MG CAPS Take 500 mg by mouth daily.    [provider]  valsartan (DIOVAN) 160 MG tablet Take 160 mg by mouth daily. 06/20/23   [provider]    Allergies: Patient has no known allergies.  Review of Systems  Constitutional:  Negative for fever.  Respiratory:  Negative for cough and shortness of breath.   Cardiovascular:  Negative for chest pain.  Gastrointestinal:  Positive for diarrhea. Negative for abdominal pain, nausea and vomiting.  Genitourinary:  Positive for frequency. Negative for dysuria.  Musculoskeletal:  Positive for falls.  Neurological:  Positive for dizziness, weakness and headaches.    Updated Vital Signs BP (!) 148/87   Pulse 88   Temp 97.9 F (36.6 C)   Resp 17   Ht 5' 5 (1.651 m)   Wt 77.1 kg   SpO2 100%   BMI  28.29 kg/m   Physical Exam Vitals and nursing note reviewed.  Constitutional:      General: She is not in acute distress.    Appearance: Normal appearance. She is well-developed.  HENT:     Head: Normocephalic and atraumatic.  Eyes:     Conjunctiva/sclera: Conjunctivae normal.  Cardiovascular:     Rate and Rhythm: Normal rate and regular rhythm.     Heart sounds: No murmur heard. Pulmonary:     Effort: Pulmonary effort is normal. No respiratory distress.     Breath sounds: Normal breath sounds.  Abdominal:     Palpations: Abdomen is soft.     Tenderness: There is no abdominal tenderness. There is no guarding or rebound.  Musculoskeletal:     Cervical back: Neck supple.     Right lower leg: Edema present.     Left lower leg: Edema present.  Skin:    General: Skin is warm and dry.     Capillary Refill: Capillary refill takes less than 2 seconds.  Neurological:     General: No focal deficit present.     Mental Status: She is alert.     Motor: Weakness present.     Gait: Gait abnormal.     Comments: She is able to move all extremities to command.  However she was a two-person assist to get out of the wheelchair and needed to be placed into bed.     (all labs ordered are listed, but only abnormal results are displayed) Labs Reviewed  CBC WITH DIFFERENTIAL/PLATELET - Abnormal; Notable for the following components:      Result Value   WBC 16.1 (*)    MCV 104.5 (*)    Neutro Abs 14.6 (*)    Lymphs Abs 0.6 (*)    Abs Immature Granulocytes 0.13 (*)    All other components within normal limits  I-STAT CG4 LACTIC ACID, ED - Abnormal; Notable for the following components:   Lactic Acid, Venous 4.1 (*)    All other components within normal limits  TROPONIN T, HIGH SENSITIVITY - Abnormal; Notable for the following components:   Troponin T High Sensitivity 20 (*)    All other components within normal limits  TROPONIN T, HIGH SENSITIVITY - Abnormal; Notable for the following  components:   Troponin T High Sensitivity 22 (*)    All other components within normal limits  RESP PANEL BY RT-PCR (RSV, FLU A&B, COVID)  RVPGX2  CULTURE, BLOOD (ROUTINE X 2)  CULTURE, BLOOD (ROUTINE X 2)  C DIFFICILE QUICK SCREEN W PCR REFLEX    GASTROINTESTINAL PANEL BY PCR, STOOL (REPLACES STOOL CULTURE)  COMPREHENSIVE METABOLIC PANEL WITH GFR  PROTIME-INR  URINALYSIS, W/ REFLEX TO CULTURE (INFECTION SUSPECTED)  LIPASE, BLOOD  CBC WITH DIFFERENTIAL/PLATELET  I-STAT CG4 LACTIC ACID, ED    EKG: EKG Interpretation Date/Time:  Friday October 26 2024  12:23:37 EST Ventricular Rate:  100 PR Interval:  148 QRS Duration:  91 QT Interval:  376 QTC Calculation: 476 R Axis:   -67  Text Interpretation: Sinus tachycardia Inferior infarct, old Abnrm T, consider ischemia, anterolateral lds increased rate from prior 2/25 Confirmed by Towana Sharper [phone]) on 10/26/2024 12:30:48 PM  Radiology: CT CHEST ABDOMEN PELVIS W CONTRAST Result Date: 10/26/2024 EXAM: CT CHEST, ABDOMEN AND PELVIS WITH CONTRAST 10/26/2024 04:07:59 PM TECHNIQUE: CT of the chest, abdomen and pelvis was performed with the administration of 75 mL of iohexol  (OMNIPAQUE ) 350 MG/ML injection. Multiplanar reformatted images are provided for review. Automated exposure control, iterative reconstruction, and/or weight based adjustment of the mA/kV was utilized to reduce the radiation dose to as low as reasonably achievable. COMPARISON: 06/15/2023 CLINICAL HISTORY: Sepsis FINDINGS: CHEST: MEDIASTINUM AND LYMPH NODES: Heart and pericardium are unremarkable. The central airways are clear. No mediastinal, hilar or axillary lymphadenopathy. LUNGS AND PLEURA: Posterior dependent atelectasis within both upper and lower lobes. Subsegmental atelectasis in the right middle lobe and lingula. Patchy nodular airspace opacities in the anterobasal left lower lobe. Calcified granuloma in the left lung apex, unchanged. No pleural effusion or  pneumothorax. ABDOMEN AND PELVIS: LIVER: Mild diffuse hepatic steatosis. GALLBLADDER AND BILE DUCTS: Distended gallbladder without radiopaque stones or wall thickening. No biliary ductal dilatation. SPLEEN: Small cyst in the inferior splenic hilum, unchanged. PANCREAS: No acute abnormality. ADRENAL GLANDS: No acute abnormality. KIDNEYS, URETERS AND BLADDER: No stones in the kidneys or ureters. No hydronephrosis. No perinephric or periureteral stranding. Urinary bladder is unremarkable. GI AND BOWEL: Small sliding type hiatal hernia. Ingested material in the mid and distal esophagus. The appendix was not visualized. No pericecal or right lower quadrant inflammatory stranding to suggest acute appendicitis. There is no bowel obstruction. REPRODUCTIVE ORGANS: No acute abnormality. PERITONEUM AND RETROPERITONEUM: No free pelvic fluid. No free air. VASCULATURE: Calcified atherosclerosis throughout the aorta. Aorta is normal in caliber. ABDOMINAL AND PELVIS LYMPH NODES: No lymphadenopathy. BONES AND SOFT TISSUES: Moderate glenohumeral joint osteoarthritis bilaterally. Unchanged chronic compression fractures of T6, T10, L1, and L3. Diffuse osteopenia. Multilevel degenerative disc disease of the spine. No focal soft tissue abnormality. IMPRESSION: 1. Patchy nodular airspace opacities in the anterobasal left lower lobe, suspicious for bronchopneumonia. 2. No other acute findings in the chest, abdomen, and pelvis. 3. Hepatic steatosis. Electronically signed by: Rogelia Myers MD 10/26/2024 04:48 PM EST RP Workstation: [id number]   CT Head Wo Contrast Result Date: 10/26/2024 EXAM: CT HEAD WITHOUT CONTRAST 10/26/2024 04:07:59 PM TECHNIQUE: CT of the head was performed without the administration of intravenous contrast. Automated exposure control, iterative reconstruction, and/or weight based adjustment of the mA/kV was utilized to reduce the radiation dose to as low as reasonably achievable. COMPARISON: 06/15/2023 CLINICAL  HISTORY: Mental status change, unknown cause. FINDINGS: BRAIN AND VENTRICLES: No acute hemorrhage. No evidence of acute infarct. No hydrocephalus. No extra-axial collection. No mass effect or midline shift. Atherosclerotic calcifications within the cavernous internal carotid arteries. ORBITS: No acute abnormality. SINUSES: No acute abnormality. SOFT TISSUES AND SKULL: No acute soft tissue abnormality. No skull fracture. IMPRESSION: 1. No acute intracranial abnormality. Electronically signed by: Donnice Mania MD 10/26/2024 04:14 PM EST RP Workstation: [id number]   DG Chest Port 1 View Result Date: 10/26/2024 EXAM: 1 VIEW(S) XRAY OF THE CHEST 10/26/2024 12:55:00 AM COMPARISON: 06/15/2023 CLINICAL HISTORY: Questionable sepsis - evaluate for abnormality FINDINGS: LUNGS AND PLEURA: Elevated right hemidiaphragm. Low lung volumes. Bibasilar atelectasis. No pleural effusion. No pneumothorax. HEART AND MEDIASTINUM: Aortic atherosclerotic calcification.  No acute abnormality of the cardiac and mediastinal silhouettes. BONES AND SOFT TISSUES: No acute osseous abnormality. IMPRESSION: 1. No acute findings. 2. Elevated right hemidiaphragm, low lung volumes, and bibasilar atelectasis. Electronically signed by: Dayne Hassell MD 10/26/2024 01:10 PM EST RP Workstation: [id number]     .Critical Care  Performed by: Towana Ozell BROCKS, MD Authorized by: Towana Ozell BROCKS, MD   Critical care provider statement:    Critical care time (minutes):  45   Critical care time was exclusive of:  Separately billable procedures and treating other patients   Critical care was necessary to treat or prevent imminent or life-threatening deterioration of the following conditions:  Sepsis   Critical care was time spent personally by me on the following activities:  Development of treatment plan with patient or surrogate, discussions with consultants, evaluation of patient's response to treatment, examination of patient, obtaining history  from patient or surrogate, ordering and performing treatments and interventions, ordering and review of laboratory studies, ordering and review of radiographic studies, pulse oximetry, re-evaluation of patient's condition and review of old charts   I assumed direction of critical care for this patient from another provider in my specialty: no      Medications Ordered in the ED  lactated ringers  infusion ( Intravenous New Bag/Given 10/26/24 1607)  ceFEPIme  (MAXIPIME ) 2 g in sodium chloride  0.9 % 100 mL IVPB (has no administration in time range)  vancomycin  (VANCOREADY) IVPB 1500 mg/300 mL (has no administration in time range)  lactated ringers  bolus 1,300 mL (has no administration in time range)  lactated ringers  bolus 1,000 mL (0 mLs Intravenous Stopped 10/26/24 1407)  metroNIDAZOLE  (FLAGYL ) IVPB 500 mg (500 mg Intravenous New Bag/Given 10/26/24 1607)  iohexol  (OMNIPAQUE ) 350 MG/ML injection 75 mL (75 mLs Intravenous Contrast Given 10/26/24 1608)    Clinical Course as of 10/26/24 1708  Fri Oct 26, 2024  1310 Chest x-ray with elevation of right hemidiaphragm.  No acute infiltrate.  Awaiting radiology reading. [MB]  1351 IV team needed to place IV [MB]    Clinical Course User Index [MB] Towana Ozell BROCKS, MD                                 Medical Decision Making Amount and/or Complexity of Data Reviewed Labs: ordered. Radiology: ordered.  Risk Prescription drug management.   This patient complains of general weakness shakiness frequent falls; this involves an extensive number of treatment Options and is a complaint that carries with it a high risk of complications and morbidity. The differential includes failure to thrive, infection, sepsis, dehydration, metabolic derangement  I ordered, reviewed and interpreted labs, which included CBC with elevated white count, chemistries LFTs unremarkable, urinalysis without signs of infection, COVID and flu negative, lactate elevated I  ordered medication IV fluids IV antibiotics and reviewed PMP when indicated. I ordered imaging studies which included CT head CT chest abdomen and pelvis chest x-ray and I independently    visualized and interpreted imaging which showed probable pneumonia Additional history obtained from patient's friend who helps care for her Previous records obtained and reviewed in epic including outpatient clinic notes Cardiac monitoring reviewed, sinus tachycardia Social determinants considered, no significant barriers Critical Interventions: Workup of patient's condition and she spiked a temperature here activating for code sepsis  After the interventions stated above, I reevaluated the patient and found patient still to be profoundly weak Admission and further testing considered, will need  admission to the hospital IV antibiotics for further management.      Final diagnoses:  Acute sepsis [facility])  Generalized weakness  Frequent falls    ED Discharge Orders     None          Towana Ozell BROCKS, MD 10/26/24 1710  "

## 2024-10-26 NOTE — Progress Notes (Addendum)
 Received patient from ED, with ongoing IV Vancomycin , on O2 at 2lpm, patient is awake and alert, pleasantly confused but able to converse and follow commands, tachypneic (28-32cpm), O2 sat at 99%, afebrile. Attached to cardiac monitor running sinus tachycardia (low 100s) with PVCs. Routine skin assessment done with the Charge RN, patient has wound over her right sole, dry, no drainage and right leg is reddened and warm to touch (not sure if this can be another source of infection). Placed comfortably in bed, call bell placed within reach. Fall precautions initiated.  Addendum Pt has also wound over her left hand, 5th digit

## 2024-10-26 NOTE — ED Triage Notes (Signed)
 Pt c.o generalized weakness, nausea and frequent urination since last night. Pt denies fever/chills.

## 2024-10-26 NOTE — ED Provider Notes (Signed)
 Care was taken over from Dr. Towana.  Patient presented with generalized weakness.  She was found to be febrile with a temperature of 103.  Chest x-ray does not show any pneumonia.  She was started on septic protocol with IV fluids and broad-spectrum IV antibiotics.  CT chest abdomen pelvis was ordered which shows evidence of pneumonia.  Discussed with Dr. Sim who will admit the patient for further treatment.  CRITICAL CARE Performed by: Andrea Ness Total critical care time: 40 minutes Critical care time was exclusive of separately billable procedures and treating other patients. Critical care was necessary to treat or prevent imminent or life-threatening deterioration. Critical care was time spent personally by me on the following activities: development of treatment plan with patient and/or surrogate as well as nursing, discussions with consultants, evaluation of patient's response to treatment, examination of patient, obtaining history from patient or surrogate, ordering and performing treatments and interventions, ordering and review of laboratory studies, ordering and review of radiographic studies, pulse oximetry and re-evaluation of patient's condition.    Ness Andrea, MD 10/26/24 [phone]

## 2024-10-26 NOTE — ED Notes (Signed)
 IV abx started, per order not to be delayed to obtain [facility].

## 2024-10-26 NOTE — ED Notes (Signed)
 Multiple RN Rn attempts for iv access and blood work. Unsuccessful. MD notified. Iv team consulted.

## 2024-10-26 NOTE — Progress Notes (Signed)
 Lab called for critical result; lactic acid is 5.4. Paged H. Cleatus, MD.

## 2024-10-27 DIAGNOSIS — A419 Sepsis, unspecified organism: Secondary | ICD-10-CM | POA: Diagnosis not present

## 2024-10-27 DIAGNOSIS — J189 Pneumonia, unspecified organism: Secondary | ICD-10-CM | POA: Diagnosis not present

## 2024-10-27 LAB — CBC
HCT: 32 % — ABNORMAL LOW (ref 36.0–46.0)
Hemoglobin: 10.9 g/dL — ABNORMAL LOW (ref 12.0–15.0)
MCH: 32.9 pg (ref 26.0–34.0)
MCHC: 34.1 g/dL (ref 30.0–36.0)
MCV: 96.7 fL (ref 80.0–100.0)
Platelets: 156 K/uL (ref 150–400)
RBC: 3.31 MIL/uL — ABNORMAL LOW (ref 3.87–5.11)
RDW: 13.6 % (ref 11.5–15.5)
WBC: 12.5 K/uL — ABNORMAL HIGH (ref 4.0–10.5)
nRBC: 0 % (ref 0.0–0.2)

## 2024-10-27 LAB — STREP PNEUMONIAE URINARY ANTIGEN: Strep Pneumo Urinary Antigen: NEGATIVE

## 2024-10-27 LAB — COMPREHENSIVE METABOLIC PANEL WITH GFR
ALT: 15 U/L (ref 0–44)
AST: 23 U/L (ref 15–41)
Albumin: 3.4 g/dL — ABNORMAL LOW (ref 3.5–5.0)
Alkaline Phosphatase: 56 U/L (ref 38–126)
Anion gap: 10 (ref 5–15)
BUN: 15 mg/dL (ref 8–23)
CO2: 25 mmol/L (ref 22–32)
Calcium: 7.7 mg/dL — ABNORMAL LOW (ref 8.9–10.3)
Chloride: 106 mmol/L (ref 98–111)
Creatinine, Ser: 0.73 mg/dL (ref 0.44–1.00)
GFR, Estimated: >60 mL/min
Glucose, Bld: 107 mg/dL — ABNORMAL HIGH (ref 70–99)
Potassium: 3.1 mmol/L — ABNORMAL LOW (ref 3.5–5.1)
Sodium: 140 mmol/L (ref 135–145)
Total Bilirubin: 0.3 mg/dL (ref 0.0–1.2)
Total Protein: 5.6 g/dL — ABNORMAL LOW (ref 6.5–8.1)

## 2024-10-27 LAB — RESPIRATORY PANEL BY PCR

## 2024-10-27 LAB — C DIFFICILE QUICK SCREEN W PCR REFLEX
C Diff antigen: NEGATIVE
C Diff interpretation: NOT DETECTED
C Diff toxin: NEGATIVE

## 2024-10-27 LAB — PROTIME-INR
INR: 1.1 (ref 0.8–1.2)
Prothrombin Time: 15.3 s — ABNORMAL HIGH (ref 11.4–15.2)

## 2024-10-27 LAB — CORTISOL-AM, BLOOD: Cortisol - AM: 13.2 ug/dL (ref 6.7–22.6)

## 2024-10-27 LAB — LACTIC ACID, PLASMA: Lactic Acid, Venous: 1.2 mmol/L (ref 0.5–1.9)

## 2024-10-27 LAB — PROCALCITONIN: Procalcitonin: 0.92 ng/mL

## 2024-10-27 MED ORDER — SODIUM CHLORIDE 0.9 % IV BOLUS
1000.0000 mL | Freq: Once | INTRAVENOUS | Status: AC
  Administered 2024-10-27: 1000 mL via INTRAVENOUS

## 2024-10-27 MED ORDER — LEVOTHYROXINE SODIUM 25 MCG PO TABS
25.0000 ug | ORAL_TABLET | Freq: Every day | ORAL | Status: DC
  Administered 2024-10-28 – 2024-11-01 (×5): 25 ug via ORAL
  Filled 2024-10-27 (×2): qty 1

## 2024-10-27 MED ORDER — MIDODRINE HCL 5 MG PO TABS
10.0000 mg | ORAL_TABLET | Freq: Once | ORAL | Status: AC
  Administered 2024-10-27: 10 mg via ORAL
  Filled 2024-10-27: qty 2

## 2024-10-27 MED ORDER — POTASSIUM CHLORIDE CRYS ER 20 MEQ PO TBCR
40.0000 meq | EXTENDED_RELEASE_TABLET | Freq: Four times a day (QID) | ORAL | Status: AC
  Administered 2024-10-27 (×2): 40 meq via ORAL
  Filled 2024-10-27 (×3): qty 2

## 2024-10-27 MED ORDER — METHYLCOBALAMIN 1000 MCG PO TBDP: 1000.0000 ug | ORAL_TABLET | Freq: Every day | ORAL | Status: DC

## 2024-10-27 MED ORDER — SODIUM CHLORIDE 0.9 % IV SOLN
500.0000 mg | Freq: Every day | INTRAVENOUS | Status: DC
  Administered 2024-10-27 – 2024-10-28 (×2): 500 mg via INTRAVENOUS
  Filled 2024-10-27 (×2): qty 5

## 2024-10-27 MED ORDER — MAGNESIUM SULFATE IN D5W 1-5 GM/100ML-% IV SOLN
1.0000 g | Freq: Once | INTRAVENOUS | Status: AC
  Administered 2024-10-27: 1 g via INTRAVENOUS
  Filled 2024-10-27: qty 100

## 2024-10-27 MED ORDER — MIDODRINE HCL 5 MG PO TABS
5.0000 mg | ORAL_TABLET | Freq: Three times a day (TID) | ORAL | Status: DC
  Administered 2024-10-27 – 2024-10-28 (×2): 5 mg via ORAL
  Filled 2024-10-27 (×3): qty 1

## 2024-10-27 MED ORDER — VITAMIN B-12 1000 MCG PO TABS
1000.0000 ug | ORAL_TABLET | Freq: Every day | ORAL | Status: DC
  Administered 2024-10-28 – 2024-11-01 (×5): 1000 ug via ORAL
  Filled 2024-10-27 (×2): qty 1

## 2024-10-27 MED ORDER — LACTATED RINGERS IV SOLN: INTRAVENOUS | Status: AC

## 2024-10-27 NOTE — Progress Notes (Signed)
 Phlebotomist at bedside to draw blood for lactic acid.

## 2024-10-27 NOTE — Progress Notes (Signed)
 Per daughter, patient is obsessed with prune juice and drinks lots of it at home. However, patient is also on IV antibiotics. Pending c.diff sample. Unable to obtain during the day d/t frequent stool smears not enough for a sample. Rectal pouch applied for skin protection.

## 2024-10-27 NOTE — Progress Notes (Signed)
"  ° °      CROSS COVER NOTE  NAME: [name]: [id number] DOB : [birth year]    Concern as stated by nurse / staff   Hypotension and lactic acid 4.1-->5.4, and patient increasingly confused  I am implementing RED MEWS protocol on her due to fever and increased HR. Upon checking right now, blood pressure is starting to drop 102/46(63), 78/61(66), 82/43(56) and 93/34(54) that interval. Latest Temp is 101.7     Pertinent findings on chart review: Active issues: -Sepsis secondary to pneumonia  Patient Assessment   Date/Time Temp Pulse Resp BP SpO2 O2 Device O2   10/27/24 0014 -- 96 19 93/39 Abnormal  98 % -- --  10/27/24 0010 -- 95 27 Abnormal  79/45 Abnormal  97 % -- --  10/27/24 0009 -- 94 26 Abnormal  79/45 Abnormal  96 % -- --  10/27/24 0007 -- 100 26 Abnormal  93/39 Abnormal  96 % -- --  10/27/24 0006 -- 97 27 Abnormal  93/39 Abnormal  96 % -- --  10/26/24 2315 -- 91 24 Abnormal  107/57 Abnormal  100 % Nasal Cannula 2 L/min  10/26/24 2258 103 F (39.4 C) Abnormal  92 26 Abnormal  109/29 Abnormal  99 % Nasal Cannula 2 L/min  Physical Exam Vitals and nursing note reviewed.  Constitutional:      General: She is not in acute distress.    Comments: Awake, alert and pleasantly confused  Cardiovascular:     Rate and Rhythm: Regular rhythm. Tachycardia present.     Heart sounds: Normal heart sounds.  Pulmonary:     Effort: Tachypnea present.     Breath sounds: Wheezing present.     Comments: Mild conversational dyspnea Abdominal:     Palpations: Abdomen is soft.     Tenderness: There is no abdominal tenderness.  Neurological:     General: No focal deficit present.        Workup Lactic Acid, Venous    Component Value Date/Time   LATICACIDVEN 5.4 (HH) 10/26/2024 2130     Assessment and  Interventions   Assessment:  Severe sepsis/septic shock secondary to pneumonia  Plan: Patient received 2.3 L bolus earlier and currently on LR at 150 mL/h NS bolus x 1  L and midodrine  10 mg p.o. x 1 Transfer to ICU start pressors if no improvement in blood pressure with transferred to the ICU Continue to trend lactic acid  Reassessment: -Patient responding to fluid bolus and midodrine  so we will hold off on transfer to ICU but will continue to monitor closely      CRITICAL CARE Performed by: Delayne LULLA Solian   Total critical care time: 60 minutes  Critical care time was exclusive of separately billable procedures and treating other patients.  Critical care was necessary to treat or prevent imminent or life-threatening deterioration.  Critical care was time spent personally by me on the following activities: development of treatment plan with patient and/or surrogate as well as nursing, discussions with consultants, evaluation of patient's response to treatment, examination of patient, obtaining history from patient or surrogate, ordering and performing treatments and interventions, ordering and review of laboratory studies, ordering and review of radiographic studies, pulse oximetry and re-evaluation of patient's condition.  "

## 2024-10-27 NOTE — Progress Notes (Signed)
" °   10/26/24 2258  Assess: MEWS Score  Temp (!) 103 F (39.4 C)  BP (!) 109/29  MAP (mmHg) (!) 53  Pulse Rate 92  ECG Heart Rate 92  Resp (!) 26  Level of Consciousness Alert  SpO2 99 %  O2 Device Nasal Cannula  O2 Flow Rate (L/min) 2 L/min  Assess: MEWS Score  MEWS Temp 2  MEWS Systolic 0  MEWS Pulse 0  MEWS RR 2  MEWS LOC 0  MEWS Score 4  MEWS Score Color Red  Assess: if the MEWS score is Yellow or Red  Were vital signs accurate and taken at a resting state? Yes  Does the patient meet 2 or more of the SIRS criteria? Yes  Does the patient have a confirmed or suspected source of infection? Yes  MEWS guidelines implemented  Yes, red  Treat  MEWS Interventions Considered administering scheduled or prn medications/treatments as ordered  Take Vital Signs  Increase Vital Sign Frequency  Red: Q1hr x2, continue Q4hrs until patient remains green for 12hrs  Escalate  MEWS: Escalate Red: Discuss with charge nurse and notify provider. Consider notifying RRT. If remains red for 2 hours consider need for higher level of care  Notify: Charge Nurse/RN  Name of Charge Nurse/RN Notified Elspeth, RN  Provider Notification  Provider Name/Title H. Cleatus, MD  Date Provider Notified 10/26/24  Time Provider Notified 2304  Method of Notification Page  Notification Reason Change in status;Other (Comment) (hyoptension, RED MEWS)  Provider response See new orders;At bedside  Date of Provider Response 10/26/24  Time of Provider Response 2310  Notify: Rapid Response  Name of Rapid Response RN Notified Mindy, RN  Date Rapid Response Notified 10/26/24  Time Rapid Response Notified 2345  Assess: SIRS CRITERIA  SIRS Temperature  1  SIRS Respirations  1  SIRS Pulse 1  SIRS WBC 1  SIRS Score Sum  4    "

## 2024-10-27 NOTE — Plan of Care (Signed)
   Problem: Education: Goal: Knowledge of General Education information will improve Description: Including pain rating scale, medication(s)/side effects and non-pharmacologic comfort measures Outcome: Progressing   Problem: Clinical Measurements: Goal: Ability to maintain clinical measurements within normal limits will improve Outcome: Progressing Goal: Diagnostic test results will improve Outcome: Progressing Goal: Respiratory complications will improve Outcome: Progressing   Problem: Activity: Goal: Risk for activity intolerance will decrease Outcome: Progressing   Problem: Nutrition: Goal: Adequate nutrition will be maintained Outcome: Progressing

## 2024-10-27 NOTE — Progress Notes (Signed)
 " PROGRESS NOTE    [name]  FMW:[id number] DOB: September 05, 1946 DOA: 10/26/2024 PCP: Teresa Channel, MD   Chief Complaint  Patient presents with   Weakness   Urinary Frequency   Nausea    Brief Narrative:    [name] is a 78 y.o. female with medical history significant of squamous cell carcinoma of the anus, hyperlipidemia, anxiety disorder, depression, history of previous bacteremias including staph and E. coli, recurrent UTIs who presented to the ER with generalized weakness, nausea and frequent urination since last night.  She denied any subjective fever or chills but objectively she was found to be febrile.  Patient also met sepsis criteria with evidence of pneumonia and lactic acidosis.  Denied Sick contacts.  Respiratory screen is negative for flu COVID-19 or RSV.  Patient is being admitted with sepsis due to multifocal pneumonia.  She is hemodynamically stable.  Mildly elevated troponin otherwise.  Lactic acid is 4.1.   Assessment & Plan:   Principal Problem:   Sepsis due to pneumonia [facility]) Active Problems:   Hyperlipidemia   Malignant neoplasm of anal canal (HCC)   Depression   Anxiety   Hypothyroid  Severe sepsis, POA due to pneumonia - Sepsis, POA, present on admission, febrile, tachypneic, tachycardic, hypotensive with elevated lactic acid and white blood cell count -Eventually responded to multiple fluid boluses, no need for pressors. - Continue with midodrine  -Continue with aggressive IV fluids. -Continue with pneumonia coverage with Rocephin  and azithromycin  - Influenza/RSV/COVID is negative, will check respiratory panel, Legionella antigen, strep pneumoniae antigen and sputum cultures.  (QTc 476, will monitor closely for now while she is on azithromycin ). - She has diarrhea as well, will check GI panel and C. difficile.SABRA   Hyperlipidemia - Resume statin when stable   history of malignant neoplasm of anal canal:   in remission.  Continue  monitoring  Acute metabolic encephalopathy -Due to severe sepsis, -As well will hold all medication may contribute to encephalopathy including psych meds  Hypokalemia -replaced   Depression with anxiety - Hold all meds for now given hypotension and confusion   Hypothyroidism -Continue with Synthroid    history of previous bacteremias - Follow-up blood culture closely.   DVT prophylaxis: (Lovenox /) Code Status: (Full) Family Communication: (none at bedside Disposition:   Status is: Inpatient    Consultants:  none   Subjective:  He is with confusion overnight, was hypotensive requiring multiple fluid boluses and rapid response  Objective: Vitals:   10/27/24 0400 10/27/24 0700 10/27/24 0912 10/27/24 1254  BP: (!) 102/49   (!) 108/57  Pulse: 70     Resp: (!) 21     Temp:  97.9 F (36.6 C) 98.2 F (36.8 C) 98.3 F (36.8 C)  TempSrc:  Oral Oral Oral  SpO2: 99%     Weight:      Height:        Intake/Output Summary (Last 24 hours) at 10/27/2024 1334 Last data filed at 10/27/2024 0930 Gross per 24 hour  Intake 1420 ml  Output 400 ml  Net 1020 ml   Filed Weights   10/26/24 1140  Weight: 77.1 kg    Examination:  Awake Alert, frail, deconditioned, oriented x 1, pleasant but confused Good air entry bilaterally with scattered rhonchi RRR, +ve B.Sounds No Cyanosis, Clubbing or edema, No new Rash or bruise      Data Reviewed: I have personally reviewed following labs and imaging studies  CBC: Recent Labs  Lab 10/26/24 1445 10/26/24 2130 10/27/24  0339  WBC 16.1* 19.1* 12.5*  NEUTROABS 14.6*  --   --   HGB 14.0 13.5 10.9*  HCT 44.3 40.2 32.0*  MCV 104.5* 99.0 96.7  PLT 181 190 156    Basic Metabolic Panel: Recent Labs  Lab 10/26/24 1328 10/26/24 2130 10/27/24 0339  NA 141  --  140  K 4.2  --  3.1*  CL 101  --  106  CO2 26  --  25  GLUCOSE 91  --  107*  BUN 11  --  15  CREATININE 0.81 0.88 0.73  CALCIUM 9.5  --  7.7*     GFR: Estimated Creatinine Clearance: 60.4 mL/min (by C-G formula based on SCr of 0.73 mg/dL).  Liver Function Tests: Recent Labs  Lab 10/26/24 1328 10/27/24 0339  AST 37 23  ALT 25 15  ALKPHOS 69 56  BILITOT 0.5 0.3  PROT 7.6 5.6*  ALBUMIN  4.7 3.4*    CBG: No results for input(s): GLUCAP in the last 168 hours.   Recent Results (from the past 240 hours)  Resp panel by RT-PCR (RSV, Flu A&B, Covid) Anterior Nasal Swab     Status: None   Collection Time: 10/26/24 12:08 PM   Specimen: Anterior Nasal Swab  Result Value Ref Range Status   SARS Coronavirus 2 by RT PCR NEGATIVE NEGATIVE Final   Influenza A by PCR NEGATIVE NEGATIVE Final   Influenza B by PCR NEGATIVE NEGATIVE Final    Comment: (NOTE) The Xpert Xpress SARS-CoV-2/FLU/RSV plus assay is intended as an aid in the diagnosis of influenza from Nasopharyngeal swab specimens and should not be used as a sole basis for treatment. Nasal washings and aspirates are unacceptable for Xpert Xpress SARS-CoV-2/FLU/RSV testing.  Fact Sheet for Patients: bloggercourse.com  Fact Sheet for Healthcare Providers: seriousbroker.it  This test is not yet approved or cleared by the [place]  FDA and has been authorized for detection and/or diagnosis of SARS-CoV-2 by FDA under an Emergency Use Authorization (EUA). This EUA will remain in effect (meaning this test can be used) for the duration of the COVID-19 declaration under Section 564(b)(1) of the Act, 21 U.S.C. section 360bbb-3(b)(1), unless the authorization is terminated or revoked.     Resp Syncytial Virus by PCR NEGATIVE NEGATIVE Final    Comment: (NOTE) Fact Sheet for Patients: bloggercourse.com  Fact Sheet for Healthcare Providers: seriousbroker.it  This test is not yet approved or cleared by the [place]  FDA and has been authorized for detection and/or  diagnosis of SARS-CoV-2 by FDA under an Emergency Use Authorization (EUA). This EUA will remain in effect (meaning this test can be used) for the duration of the COVID-19 declaration under Section 564(b)(1) of the Act, 21 U.S.C. section 360bbb-3(b)(1), unless the authorization is terminated or revoked.  Performed at [facility], [address], [place], [place] [address]   Blood Culture (routine x 2)     Status: None (Preliminary result)   Collection Time: 10/26/24 12:12 PM   Specimen: BLOOD LEFT WRIST  Result Value Ref Range Status   Specimen Description BLOOD LEFT WRIST  Final   Special Requests   Final    BOTTLES DRAWN AEROBIC AND ANAEROBIC Blood Culture results may not be optimal due to an inadequate volume of blood received in culture bottles   Culture   Final    NO GROWTH < 24 HOURS Performed at [facility], [address], [place], [place] [address]    Report Status PENDING  Incomplete  Blood Culture (routine x 2)     Status: None (Preliminary result)   Collection Time: 10/26/24  6:58 PM   Specimen: BLOOD LEFT WRIST  Result Value Ref Range Status   Specimen Description BLOOD LEFT WRIST  Final   Special Requests   Final    BOTTLES DRAWN AEROBIC AND ANAEROBIC Blood Culture results may not be optimal due to an inadequate volume of blood received in culture bottles   Culture   Final    NO GROWTH < 24 HOURS Performed at [facility], [address] [place], [place] [address]    Report Status PENDING  Incomplete         Radiology Studies: CT CHEST ABDOMEN PELVIS W CONTRAST Result Date: 10/26/2024 EXAM: CT CHEST, ABDOMEN AND PELVIS WITH CONTRAST 10/26/2024 04:07:59 PM TECHNIQUE: CT of the chest, abdomen and pelvis was performed with the administration of 75 mL of iohexol  (OMNIPAQUE ) 350 MG/ML injection. Multiplanar reformatted images are provided for review. Automated exposure control, iterative reconstruction, and/or weight based adjustment of the  mA/kV was utilized to reduce the radiation dose to as low as reasonably achievable. COMPARISON: 06/15/2023 CLINICAL HISTORY: Sepsis FINDINGS: CHEST: MEDIASTINUM AND LYMPH NODES: Heart and pericardium are unremarkable. The central airways are clear. No mediastinal, hilar or axillary lymphadenopathy. LUNGS AND PLEURA: Posterior dependent atelectasis within both upper and lower lobes. Subsegmental atelectasis in the right middle lobe and lingula. Patchy nodular airspace opacities in the anterobasal left lower lobe. Calcified granuloma in the left lung apex, unchanged. No pleural effusion or pneumothorax. ABDOMEN AND PELVIS: LIVER: Mild diffuse hepatic steatosis. GALLBLADDER AND BILE DUCTS: Distended gallbladder without radiopaque stones or wall thickening. No biliary ductal dilatation. SPLEEN: Small cyst in the inferior splenic hilum, unchanged. PANCREAS: No acute abnormality. ADRENAL GLANDS: No acute abnormality. KIDNEYS, URETERS AND BLADDER: No stones in the kidneys or ureters. No hydronephrosis. No perinephric or periureteral stranding. Urinary bladder is unremarkable. GI AND BOWEL: Small sliding type hiatal hernia. Ingested material in the mid and distal esophagus. The appendix was not visualized. No pericecal or right lower quadrant inflammatory stranding to suggest acute appendicitis. There is no bowel obstruction. REPRODUCTIVE ORGANS: No acute abnormality. PERITONEUM AND RETROPERITONEUM: No free pelvic fluid. No free air. VASCULATURE: Calcified atherosclerosis throughout the aorta. Aorta is normal in caliber. ABDOMINAL AND PELVIS LYMPH NODES: No lymphadenopathy. BONES AND SOFT TISSUES: Moderate glenohumeral joint osteoarthritis bilaterally. Unchanged chronic compression fractures of T6, T10, L1, and L3. Diffuse osteopenia. Multilevel degenerative disc disease of the spine. No focal soft tissue abnormality. IMPRESSION: 1. Patchy nodular airspace opacities in the anterobasal left lower lobe, suspicious for  bronchopneumonia. 2. No other acute findings in the chest, abdomen, and pelvis. 3. Hepatic steatosis. Electronically signed by: Rogelia Myers MD 10/26/2024 04:48 PM EST RP Workstation: [id number]   CT Head Wo Contrast Result Date: 10/26/2024 EXAM: CT HEAD WITHOUT CONTRAST 10/26/2024 04:07:59 PM TECHNIQUE: CT of the head was performed without the administration of intravenous contrast. Automated exposure control, iterative reconstruction, and/or weight based adjustment of the mA/kV was utilized to reduce the radiation dose to as low as reasonably achievable. COMPARISON: 06/15/2023 CLINICAL HISTORY: Mental status change, unknown cause. FINDINGS: BRAIN AND VENTRICLES: No acute hemorrhage. No evidence of acute infarct. No hydrocephalus. No extra-axial collection. No mass effect or midline shift. Atherosclerotic calcifications within the cavernous internal carotid arteries. ORBITS: No acute abnormality. SINUSES: No acute abnormality. SOFT TISSUES AND SKULL: No acute soft tissue abnormality. No skull fracture. IMPRESSION: 1. No acute intracranial abnormality. Electronically signed  by: Donnice Mania MD 10/26/2024 04:14 PM EST RP Workstation: [id number]   DG Chest Port 1 View Result Date: 10/26/2024 EXAM: 1 VIEW(S) XRAY OF THE CHEST 10/26/2024 12:55:00 AM COMPARISON: 06/15/2023 CLINICAL HISTORY: Questionable sepsis - evaluate for abnormality FINDINGS: LUNGS AND PLEURA: Elevated right hemidiaphragm. Low lung volumes. Bibasilar atelectasis. No pleural effusion. No pneumothorax. HEART AND MEDIASTINUM: Aortic atherosclerotic calcification. No acute abnormality of the cardiac and mediastinal silhouettes. BONES AND SOFT TISSUES: No acute osseous abnormality. IMPRESSION: 1. No acute findings. 2. Elevated right hemidiaphragm, low lung volumes, and bibasilar atelectasis. Electronically signed by: Dayne Hassell MD 10/26/2024 01:10 PM EST RP Workstation: [id number]        Scheduled Meds:  enoxaparin  (LOVENOX ) injection   40 mg Subcutaneous Q24H   potassium chloride   40 mEq Oral Q6H   Continuous Infusions:  azithromycin  500 mg (10/27/24 1057)   cefTRIAXone  (ROCEPHIN )  IV 2 g (10/26/24 2203)   lactated ringers        LOS: 1 day    Brayton Lye, MD [facility]   To contact the attending provider between 7A-7P or the covering provider during after hours 7P-7A, please log into the web site [email or web address] and access using universal [place] password for that web site. If you do not have the password, please call the hospital operator.  10/27/2024, 1:34 PM   "

## 2024-10-27 NOTE — Progress Notes (Signed)
 Paged Dr. Cleatus at 0010 due to observed hypotension with a MAP of the 50s. Pt is on RED MEWS again due to fever, tachypnea and low BP. Pt has no complaints in relation to her numbers. Dr. Cleatus ordered a bolus on NS 1L, midodrine  and to repeat lactic acid post bolus. Charge RN informed, called RRT to give heads up for possible ICU transfer if no response with treatment on hypotension. At 0050, Dr. Cleatus is at bedside and instructed to inform latest VS post bolus. Patient is awake and alert and is aware of the situation.

## 2024-10-27 NOTE — Progress Notes (Signed)
 Been calling for phlebotomist since 1:33am for repeat lactic acid. Acknowledged the call. Repeated twice and responded to be in the unit shortly but no phlebotomist up to this time of writing. Charge RN informed.

## 2024-10-28 DIAGNOSIS — A419 Sepsis, unspecified organism: Secondary | ICD-10-CM | POA: Diagnosis not present

## 2024-10-28 DIAGNOSIS — J189 Pneumonia, unspecified organism: Secondary | ICD-10-CM | POA: Diagnosis not present

## 2024-10-28 LAB — GASTROINTESTINAL PANEL BY PCR, STOOL (REPLACES STOOL CULTURE)

## 2024-10-28 LAB — CBC
HCT: 37.6 % (ref 36.0–46.0)
Hemoglobin: 12.2 g/dL (ref 12.0–15.0)
MCH: 32.7 pg (ref 26.0–34.0)
MCHC: 32.4 g/dL (ref 30.0–36.0)
MCV: 100.8 fL — ABNORMAL HIGH (ref 80.0–100.0)
Platelets: 145 K/uL — ABNORMAL LOW (ref 150–400)
RBC: 3.73 MIL/uL — ABNORMAL LOW (ref 3.87–5.11)
RDW: 14 % (ref 11.5–15.5)
WBC: 9 K/uL (ref 4.0–10.5)
nRBC: 0 % (ref 0.0–0.2)

## 2024-10-28 LAB — MAGNESIUM: Magnesium: 2.4 mg/dL (ref 1.7–2.4)

## 2024-10-28 LAB — BASIC METABOLIC PANEL WITH GFR
Anion gap: 15 (ref 5–15)
BUN: 16 mg/dL (ref 8–23)
CO2: 17 mmol/L — ABNORMAL LOW (ref 22–32)
Calcium: 8.5 mg/dL — ABNORMAL LOW (ref 8.9–10.3)
Chloride: 107 mmol/L (ref 98–111)
Creatinine, Ser: 0.61 mg/dL (ref 0.44–1.00)
GFR, Estimated: >60 mL/min
Glucose, Bld: 83 mg/dL (ref 70–99)
Potassium: 4.3 mmol/L (ref 3.5–5.1)
Sodium: 140 mmol/L (ref 135–145)

## 2024-10-28 LAB — C DIFFICILE QUICK SCREEN W PCR REFLEX
C Diff antigen: NEGATIVE
C Diff interpretation: NOT DETECTED
C Diff toxin: NEGATIVE

## 2024-10-28 LAB — PHOSPHORUS: Phosphorus: 1.8 mg/dL — ABNORMAL LOW (ref 2.5–4.6)

## 2024-10-28 MED ORDER — LORAZEPAM 2 MG/ML IJ SOLN: 1.0000 mg | Freq: Once | INTRAMUSCULAR | Status: AC

## 2024-10-28 MED ORDER — LOPERAMIDE HCL 2 MG PO CAPS
4.0000 mg | ORAL_CAPSULE | Freq: Once | ORAL | Status: AC
  Administered 2024-10-28: 4 mg via ORAL
  Filled 2024-10-28: qty 2

## 2024-10-28 MED ORDER — LORAZEPAM 2 MG/ML IJ SOLN
INTRAMUSCULAR | Status: AC
  Administered 2024-10-28: 1 mg via INTRAVENOUS
  Filled 2024-10-28: qty 1

## 2024-10-28 MED ORDER — ARIPIPRAZOLE 5 MG PO TABS
5.0000 mg | ORAL_TABLET | Freq: Every day | ORAL | Status: DC
  Administered 2024-10-28: 5 mg via ORAL
  Filled 2024-10-28: qty 1

## 2024-10-28 MED ORDER — LOPERAMIDE HCL 2 MG PO CAPS
2.0000 mg | ORAL_CAPSULE | Freq: Once | ORAL | Status: AC
  Administered 2024-10-28: 2 mg via ORAL
  Filled 2024-10-28: qty 1

## 2024-10-28 MED ORDER — DOXYCYCLINE HYCLATE 100 MG PO TABS
100.0000 mg | ORAL_TABLET | Freq: Two times a day (BID) | ORAL | Status: DC
  Administered 2024-10-29 – 2024-11-01 (×7): 100 mg via ORAL
  Filled 2024-10-28 (×2): qty 1

## 2024-10-28 MED ORDER — RACEPINEPHRINE HCL 2.25 % IN NEBU
0.2500 mL | INHALATION_SOLUTION | Freq: Once | RESPIRATORY_TRACT | Status: AC
  Administered 2024-10-28: 0.25 mL via RESPIRATORY_TRACT
  Filled 2024-10-28 (×2): qty 15

## 2024-10-28 MED ORDER — IRBESARTAN 300 MG PO TABS
150.0000 mg | ORAL_TABLET | Freq: Every day | ORAL | Status: DC
  Administered 2024-10-28 – 2024-11-01 (×5): 150 mg via ORAL
  Filled 2024-10-28 (×2): qty 1

## 2024-10-28 MED ORDER — HALOPERIDOL LACTATE 5 MG/ML IJ SOLN
2.0000 mg | Freq: Four times a day (QID) | INTRAMUSCULAR | Status: DC | PRN
  Administered 2024-10-29 (×2): 2 mg via INTRAVENOUS

## 2024-10-28 MED ORDER — METHYLPREDNISOLONE SODIUM SUCC 125 MG IJ SOLR
125.0000 mg | Freq: Three times a day (TID) | INTRAMUSCULAR | Status: DC
  Administered 2024-10-28 – 2024-10-29 (×3): 125 mg via INTRAVENOUS
  Filled 2024-10-28 (×3): qty 2

## 2024-10-28 MED ORDER — VITAMIN D 25 MCG (1000 UNIT) PO TABS
1000.0000 [IU] | ORAL_TABLET | Freq: Every day | ORAL | Status: DC
  Administered 2024-10-28 – 2024-11-01 (×5): 1000 [IU] via ORAL
  Filled 2024-10-28: qty 1

## 2024-10-28 MED ORDER — HYDRALAZINE HCL 20 MG/ML IJ SOLN: 5.0000 mg | Freq: Four times a day (QID) | INTRAMUSCULAR | Status: DC | PRN

## 2024-10-28 MED ORDER — LORAZEPAM 2 MG/ML IJ SOLN
1.0000 mg | INTRAMUSCULAR | Status: DC | PRN
  Administered 2024-10-28: 1 mg via INTRAVENOUS
  Filled 2024-10-28: qty 1

## 2024-10-28 MED ORDER — QUETIAPINE FUMARATE 100 MG PO TABS
100.0000 mg | ORAL_TABLET | Freq: Every day | ORAL | Status: DC
  Administered 2024-10-28 – 2024-10-31 (×4): 100 mg via ORAL
  Filled 2024-10-28 (×2): qty 1

## 2024-10-28 MED ORDER — GABAPENTIN 300 MG PO CAPS
300.0000 mg | ORAL_CAPSULE | Freq: Two times a day (BID) | ORAL | Status: DC
  Administered 2024-10-28 – 2024-11-01 (×8): 300 mg via ORAL
  Filled 2024-10-28 (×3): qty 1

## 2024-10-28 MED ORDER — DONEPEZIL HCL 5 MG PO TABS
5.0000 mg | ORAL_TABLET | Freq: Every day | ORAL | Status: DC
  Administered 2024-10-29 – 2024-10-31 (×3): 5 mg via ORAL
  Filled 2024-10-28 (×2): qty 1

## 2024-10-28 NOTE — Progress Notes (Signed)
 " PROGRESS NOTE    [name]  FMW:[id number] DOB: 1946-05-26 DOA: 10/26/2024 PCP: Teresa Channel, MD   Chief Complaint  Patient presents with   Weakness   Urinary Frequency   Nausea    Brief Narrative:    [name] is a 78 y.o. female with medical history significant of squamous cell carcinoma of the anus, hyperlipidemia, anxiety disorder, depression, history of previous bacteremias including staph and E. coli, recurrent UTIs who presented to the ER with generalized weakness, nausea and frequent urination since last night.  She denied any subjective fever or chills but objectively she was found to be febrile.  Patient also met sepsis criteria with evidence of pneumonia and lactic acidosis.  Denied Sick contacts.  Respiratory screen is negative for flu COVID-19 or RSV.  Patient is being admitted with sepsis due to multifocal pneumonia.  She is hemodynamically stable.  Mildly elevated troponin otherwise.  Lactic acid is 4.1.   Assessment & Plan:   Principal Problem:   Sepsis due to pneumonia [facility]) Active Problems:   Hyperlipidemia   Malignant neoplasm of anal canal (HCC)   Depression   Anxiety   Hypothyroid  Severe sepsis, POA due to pneumonia - Sepsis, POA, present on admission, febrile, tachypneic, tachycardic, hypotensive with elevated lactic acid and white blood cell count -Eventually responded to multiple fluid boluses, no need for pressors. - Continue with midodrine  -Continue with aggressive IV fluids. -Continue with pneumonia coverage with Rocephin  and azithromycin  - Influenza/RSV/COVID is negative, will check respiratory panel, Legionella antigen, strep pneumoniae antigen and sputum cultures.  (QTc 476, will monitor closely for now while she is on azithromycin ). - She has diarrhea, C. difficile is negative, GI panel is pending. -strep  pneumonia antigen is negative, Legionella still pending.   Hyperlipidemia - Resume statin when stable   history of malignant  neoplasm of anal canal:   in remission.  Continue monitoring  Acute metabolic encephalopathy -Due to severe sepsis, -CT head on admission with no acute findings -As well will hold all medication may contribute to encephalopathy including psych meds - Improving  Hypokalemia -replaced   Depression with anxiety Mild dementia  - Hold all meds for now given hypotension and confusion   Hypothyroidism -Continue with Synthroid   Hypertension -Blood pressure started to increase, she is on valsartan at home, will resume will add as needed hydralazine    history of previous bacteremias - Follow-up blood culture closely.   DVT prophylaxis: (Lovenox /) Code Status: (Full) Family Communication: (none at bedside, called daughter by phone, left voice message) Disposition:   Status is: Inpatient    Consultants:  none   Subjective:  Awake this morning, had diarrhea, appetite has improved, patient reports she is feeling better today.     Objective: Vitals:   10/28/24 0000 10/28/24 0332 10/28/24 0700 10/28/24 1100  BP: (!) 139/55 134/75 (!) 146/78 (!) 158/83  Pulse: 60 72    Resp: 16 17    Temp: 98.6 F (37 C) 98.2 F (36.8 C) 97.6 F (36.4 C) (!) 97.3 F (36.3 C)  TempSrc: Oral Oral Oral Oral  SpO2: 95% 97%    Weight:      Height:        Intake/Output Summary (Last 24 hours) at 10/28/2024 1223 Last data filed at 10/28/2024 0600 Gross per 24 hour  Intake 2090.86 ml  Output 100 ml  Net 1990.86 ml   Filed Weights   10/26/24 1140  Weight: 77.1 kg    Examination:  Awake  Alert, frail, deconditioned, awake and interactive today, oriented x 2 more appropriate but still confused Good air entry bilaterally with scattered rhonchi RRR, +ve B.Sounds No Cyanosis, Clubbing or edema, No new Rash or bruise      Data Reviewed: I have personally reviewed following labs and imaging studies  CBC: Recent Labs  Lab 10/26/24 1445 10/26/24 2130 10/27/24 0339 10/28/24 0602   WBC 16.1* 19.1* 12.5* 9.0  NEUTROABS 14.6*  --   --   --   HGB 14.0 13.5 10.9* 12.2  HCT 44.3 40.2 32.0* 37.6  MCV 104.5* 99.0 96.7 100.8*  PLT 181 190 156 145*    Basic Metabolic Panel: Recent Labs  Lab 10/26/24 1328 10/26/24 2130 10/27/24 0339 10/28/24 0602  NA 141  --  140 140  K 4.2  --  3.1* 4.3  CL 101  --  106 107  CO2 26  --  25 17*  GLUCOSE 91  --  107* 83  BUN 11  --  15 16  CREATININE 0.81 0.88 0.73 0.61  CALCIUM 9.5  --  7.7* 8.5*  MG  --   --   --  2.4  PHOS  --   --   --  1.8*    GFR: Estimated Creatinine Clearance: 60.4 mL/min (by C-G formula based on SCr of 0.61 mg/dL).  Liver Function Tests: Recent Labs  Lab 10/26/24 1328 10/27/24 0339  AST 37 23  ALT 25 15  ALKPHOS 69 56  BILITOT 0.5 0.3  PROT 7.6 5.6*  ALBUMIN  4.7 3.4*    CBG: No results for input(s): GLUCAP in the last 168 hours.   Recent Results (from the past 240 hours)  Resp panel by RT-PCR (RSV, Flu A&B, Covid) Anterior Nasal Swab     Status: None   Collection Time: 10/26/24 12:08 PM   Specimen: Anterior Nasal Swab  Result Value Ref Range Status   SARS Coronavirus 2 by RT PCR NEGATIVE NEGATIVE Final   Influenza A by PCR NEGATIVE NEGATIVE Final   Influenza B by PCR NEGATIVE NEGATIVE Final    Comment: (NOTE) The Xpert Xpress SARS-CoV-2/FLU/RSV plus assay is intended as an aid in the diagnosis of influenza from Nasopharyngeal swab specimens and should not be used as a sole basis for treatment. Nasal washings and aspirates are unacceptable for Xpert Xpress SARS-CoV-2/FLU/RSV testing.  Fact Sheet for Patients: bloggercourse.com  Fact Sheet for Healthcare Providers: seriousbroker.it  This test is not yet approved or cleared by the [place]  FDA and has been authorized for detection and/or diagnosis of SARS-CoV-2 by FDA under an Emergency Use Authorization (EUA). This EUA will remain in effect (meaning this test can be  used) for the duration of the COVID-19 declaration under Section 564(b)(1) of the Act, 21 U.S.C. section 360bbb-3(b)(1), unless the authorization is terminated or revoked.     Resp Syncytial Virus by PCR NEGATIVE NEGATIVE Final    Comment: (NOTE) Fact Sheet for Patients: bloggercourse.com  Fact Sheet for Healthcare Providers: seriousbroker.it  This test is not yet approved or cleared by the [place]  FDA and has been authorized for detection and/or diagnosis of SARS-CoV-2 by FDA under an Emergency Use Authorization (EUA). This EUA will remain in effect (meaning this test can be used) for the duration of the COVID-19 declaration under Section 564(b)(1) of the Act, 21 U.S.C. section 360bbb-3(b)(1), unless the authorization is terminated or revoked.  Performed at [facility], [address], [place], [place] [address]   Blood Culture (routine  x 2)     Status: None (Preliminary result)   Collection Time: 10/26/24 12:12 PM   Specimen: BLOOD LEFT WRIST  Result Value Ref Range Status   Specimen Description BLOOD LEFT WRIST  Final   Special Requests   Final    BOTTLES DRAWN AEROBIC AND ANAEROBIC Blood Culture results may not be optimal due to an inadequate volume of blood received in culture bottles   Culture   Final    NO GROWTH 2 DAYS Performed at [facility], [address], [place], [place] [address]    Report Status PENDING  Incomplete  Blood Culture (routine x 2)     Status: None (Preliminary result)   Collection Time: 10/26/24  6:58 PM   Specimen: BLOOD LEFT WRIST  Result Value Ref Range Status   Specimen Description BLOOD LEFT WRIST  Final   Special Requests   Final    BOTTLES DRAWN AEROBIC AND ANAEROBIC Blood Culture results may not be optimal due to an inadequate volume of blood received in culture bottles   Culture   Final    NO GROWTH 2 DAYS Performed at [facility], [address],  [place], [place] [address]    Report Status PENDING  Incomplete  Respiratory (~20 pathogens) panel by PCR     Status: None   Collection Time: 10/27/24  8:03 AM   Specimen: Nasopharyngeal Swab; Respiratory  Result Value Ref Range Status   Adenovirus NOT DETECTED NOT DETECTED Final   Coronavirus 229E NOT DETECTED NOT DETECTED Final    Comment: (NOTE) The Coronavirus on the Respiratory Panel, DOES NOT test for the novel  Coronavirus (2019 nCoV)    Coronavirus HKU1 NOT DETECTED NOT DETECTED Final   Coronavirus NL63 NOT DETECTED NOT DETECTED Final   Coronavirus OC43 NOT DETECTED NOT DETECTED Final   Metapneumovirus NOT DETECTED NOT DETECTED Final   Rhinovirus / Enterovirus NOT DETECTED NOT DETECTED Final   Influenza A NOT DETECTED NOT DETECTED Final   Influenza B NOT DETECTED NOT DETECTED Final   Parainfluenza Virus 1 NOT DETECTED NOT DETECTED Final   Parainfluenza Virus 2 NOT DETECTED NOT DETECTED Final   Parainfluenza Virus 3 NOT DETECTED NOT DETECTED Final   Parainfluenza Virus 4 NOT DETECTED NOT DETECTED Final   Respiratory Syncytial Virus NOT DETECTED NOT DETECTED Final   Bordetella pertussis NOT DETECTED NOT DETECTED Final   Bordetella Parapertussis NOT DETECTED NOT DETECTED Final   Chlamydophila pneumoniae NOT DETECTED NOT DETECTED Final   Mycoplasma pneumoniae NOT DETECTED NOT DETECTED Final    Comment: Performed at [facility], [address] [place], [place] [address]  C Difficile Quick Screen w PCR reflex     Status: None   Collection Time: 10/27/24  9:18 PM   Specimen: STOOL  Result Value Ref Range Status   C Diff antigen NEGATIVE NEGATIVE Final   C Diff toxin NEGATIVE NEGATIVE Final   C Diff interpretation No C. difficile detected.  Final    Comment: Performed at [facility], [address], [place], [place] [address]         Radiology Studies: CT CHEST ABDOMEN PELVIS W CONTRAST Result Date: 10/26/2024 EXAM: CT CHEST, ABDOMEN AND PELVIS WITH CONTRAST  10/26/2024 04:07:59 PM TECHNIQUE: CT of the chest, abdomen and pelvis was performed with the administration of 75 mL of iohexol  (OMNIPAQUE ) 350 MG/ML injection. Multiplanar reformatted images are provided for review. Automated exposure control, iterative reconstruction, and/or weight based adjustment of  the mA/kV was utilized to reduce the radiation dose to as low as reasonably achievable. COMPARISON: 06/15/2023 CLINICAL HISTORY: Sepsis FINDINGS: CHEST: MEDIASTINUM AND LYMPH NODES: Heart and pericardium are unremarkable. The central airways are clear. No mediastinal, hilar or axillary lymphadenopathy. LUNGS AND PLEURA: Posterior dependent atelectasis within both upper and lower lobes. Subsegmental atelectasis in the right middle lobe and lingula. Patchy nodular airspace opacities in the anterobasal left lower lobe. Calcified granuloma in the left lung apex, unchanged. No pleural effusion or pneumothorax. ABDOMEN AND PELVIS: LIVER: Mild diffuse hepatic steatosis. GALLBLADDER AND BILE DUCTS: Distended gallbladder without radiopaque stones or wall thickening. No biliary ductal dilatation. SPLEEN: Small cyst in the inferior splenic hilum, unchanged. PANCREAS: No acute abnormality. ADRENAL GLANDS: No acute abnormality. KIDNEYS, URETERS AND BLADDER: No stones in the kidneys or ureters. No hydronephrosis. No perinephric or periureteral stranding. Urinary bladder is unremarkable. GI AND BOWEL: Small sliding type hiatal hernia. Ingested material in the mid and distal esophagus. The appendix was not visualized. No pericecal or right lower quadrant inflammatory stranding to suggest acute appendicitis. There is no bowel obstruction. REPRODUCTIVE ORGANS: No acute abnormality. PERITONEUM AND RETROPERITONEUM: No free pelvic fluid. No free air. VASCULATURE: Calcified atherosclerosis throughout the aorta. Aorta is normal in caliber. ABDOMINAL AND PELVIS LYMPH NODES: No lymphadenopathy. BONES AND SOFT TISSUES: Moderate glenohumeral  joint osteoarthritis bilaterally. Unchanged chronic compression fractures of T6, T10, L1, and L3. Diffuse osteopenia. Multilevel degenerative disc disease of the spine. No focal soft tissue abnormality. IMPRESSION: 1. Patchy nodular airspace opacities in the anterobasal left lower lobe, suspicious for bronchopneumonia. 2. No other acute findings in the chest, abdomen, and pelvis. 3. Hepatic steatosis. Electronically signed by: Rogelia Myers MD 10/26/2024 04:48 PM EST RP Workstation: [id number]   CT Head Wo Contrast Result Date: 10/26/2024 EXAM: CT HEAD WITHOUT CONTRAST 10/26/2024 04:07:59 PM TECHNIQUE: CT of the head was performed without the administration of intravenous contrast. Automated exposure control, iterative reconstruction, and/or weight based adjustment of the mA/kV was utilized to reduce the radiation dose to as low as reasonably achievable. COMPARISON: 06/15/2023 CLINICAL HISTORY: Mental status change, unknown cause. FINDINGS: BRAIN AND VENTRICLES: No acute hemorrhage. No evidence of acute infarct. No hydrocephalus. No extra-axial collection. No mass effect or midline shift. Atherosclerotic calcifications within the cavernous internal carotid arteries. ORBITS: No acute abnormality. SINUSES: No acute abnormality. SOFT TISSUES AND SKULL: No acute soft tissue abnormality. No skull fracture. IMPRESSION: 1. No acute intracranial abnormality. Electronically signed by: Donnice Mania MD 10/26/2024 04:14 PM EST RP Workstation: [id number]        Scheduled Meds:  vitamin B-12  1,000 mcg Oral Daily   enoxaparin  (LOVENOX ) injection  40 mg Subcutaneous Q24H   levothyroxine   25 mcg Oral QAC breakfast   midodrine   5 mg Oral TID WC   Continuous Infusions:  azithromycin  500 mg (10/28/24 0918)   cefTRIAXone  (ROCEPHIN )  IV 2 g (10/27/24 2122)   lactated ringers  100 mL/hr at 10/28/24 0335     LOS: 2 days    Brayton Lye, MD [facility]   To contact the attending provider between  7A-7P or the covering provider during after hours 7P-7A, please log into the web site [email or web address] and access using universal [place] password for that web site. If you do not have the password, please call the hospital operator.  10/28/2024, 12:23 PM   "

## 2024-10-28 NOTE — Progress Notes (Signed)
 Patient had a least 7 loose stools throughout the night. Had no enough rest/sleep due to frequent getting out of the bed for bathroom assistance. Gave imodium  once. C.diff is negative, GI panel is pending result.

## 2024-10-29 ENCOUNTER — Other Ambulatory Visit (HOSPITAL_COMMUNITY): Payer: Self-pay

## 2024-10-29 DIAGNOSIS — J189 Pneumonia, unspecified organism: Secondary | ICD-10-CM | POA: Diagnosis not present

## 2024-10-29 DIAGNOSIS — A419 Sepsis, unspecified organism: Secondary | ICD-10-CM | POA: Diagnosis not present

## 2024-10-29 LAB — BASIC METABOLIC PANEL WITH GFR
Anion gap: 14 (ref 5–15)
BUN: 16 mg/dL (ref 8–23)
CO2: 19 mmol/L — ABNORMAL LOW (ref 22–32)
Calcium: 8.3 mg/dL — ABNORMAL LOW (ref 8.9–10.3)
Chloride: 107 mmol/L (ref 98–111)
Creatinine, Ser: 0.6 mg/dL (ref 0.44–1.00)
GFR, Estimated: >60 mL/min
Glucose, Bld: 130 mg/dL — ABNORMAL HIGH (ref 70–99)
Potassium: 4.2 mmol/L (ref 3.5–5.1)
Sodium: 140 mmol/L (ref 135–145)

## 2024-10-29 LAB — CBC
HCT: 33.7 % — ABNORMAL LOW (ref 36.0–46.0)
Hemoglobin: 11.1 g/dL — ABNORMAL LOW (ref 12.0–15.0)
MCH: 33.1 pg (ref 26.0–34.0)
MCHC: 32.9 g/dL (ref 30.0–36.0)
MCV: 100.6 fL — ABNORMAL HIGH (ref 80.0–100.0)
Platelets: 137 K/uL — ABNORMAL LOW (ref 150–400)
RBC: 3.35 MIL/uL — ABNORMAL LOW (ref 3.87–5.11)
RDW: 13.9 % (ref 11.5–15.5)
WBC: 4.8 K/uL (ref 4.0–10.5)
nRBC: 0 % (ref 0.0–0.2)

## 2024-10-29 LAB — MAGNESIUM: Magnesium: 2.6 mg/dL — ABNORMAL HIGH (ref 1.7–2.4)

## 2024-10-29 LAB — PHOSPHORUS: Phosphorus: 2.6 mg/dL (ref 2.5–4.6)

## 2024-10-29 MED ORDER — ARIPIPRAZOLE 5 MG PO TABS
10.0000 mg | ORAL_TABLET | Freq: Every day | ORAL | Status: DC
  Administered 2024-10-30 – 2024-11-01 (×3): 10 mg via ORAL
  Filled 2024-10-29 (×4): qty 2

## 2024-10-29 MED ORDER — DULOXETINE HCL 60 MG PO CPEP
120.0000 mg | ORAL_CAPSULE | Freq: Every day | ORAL | Status: DC
  Administered 2024-10-29 – 2024-11-01 (×4): 120 mg via ORAL
  Filled 2024-10-29 (×4): qty 2

## 2024-10-29 MED ORDER — GERHARDT'S BUTT CREAM
TOPICAL_CREAM | Freq: Two times a day (BID) | CUTANEOUS | Status: DC
  Administered 2024-10-31: 1 via TOPICAL
  Filled 2024-10-29 (×3): qty 60

## 2024-10-29 MED ORDER — LOPERAMIDE HCL 2 MG PO CAPS
2.0000 mg | ORAL_CAPSULE | Freq: Once | ORAL | Status: AC
  Administered 2024-10-29: 2 mg via ORAL
  Filled 2024-10-29: qty 1

## 2024-10-29 NOTE — Progress Notes (Signed)
 " PROGRESS NOTE    [name]  FMW:[id number] DOB: 22-May-1946 DOA: 10/26/2024 PCP: Teresa Channel, MD   Chief Complaint  Patient presents with   Weakness   Urinary Frequency   Nausea    Brief Narrative:    [name] is a 78 y.o. female with medical history significant of squamous cell carcinoma of the anus, hyperlipidemia, anxiety disorder, depression, history of previous bacteremias including staph and E. coli, recurrent UTIs who presented to the ER with generalized weakness, nausea and frequent urination since last night.  She denied any subjective fever or chills but objectively she was found to be febrile.  Patient also met sepsis criteria with evidence of pneumonia and lactic acidosis.  Denied Sick contacts.  Respiratory screen is negative for flu COVID-19 or RSV.  Patient is being admitted with sepsis due to multifocal pneumonia.  She is hemodynamically stable.  Mildly elevated troponin otherwise.  Lactic acid is 4.1.   Assessment & Plan:   Principal Problem:   Sepsis due to pneumonia [facility]) Active Problems:   Hyperlipidemia   Malignant neoplasm of anal canal (HCC)   Depression   Anxiety   Hypothyroid  Severe sepsis, POA due to pneumonia - Sepsis, POA, present on admission, febrile, tachypneic, tachycardic, hypotensive with elevated lactic acid and white blood cell count -Eventually responded to multiple fluid boluses, no need for pressors. - Continue with midodrine  -Continue with aggressive IV fluids. -Continue with pneumonia coverage with Rocephin  and azithromycin  - Influenza/RSV/COVID is negative, negative respiratory panel, strep pneumoniae antigen is negative, Legionella is pending, sputum culture is pending.   - She has diarrhea, C. difficile is negative, GI is negative as well   Hyperlipidemia - Resume statin when stable   history of malignant neoplasm of anal canal:   in remission.  Continue monitoring  Acute metabolic encephalopathy Hospital  delirium Dementia -Due to severe sepsis, -Per caregiver she has been diagnosed with dementia, but so far has been managed to live by herself she needs caregiver visits intermittently mainly for medication administration -CT head on admission with no acute findings -Mentation has improved initially, but yesterday she developed significant confusion, restlessness, likely the setting of hospital delirium. -Patient on multiple psych medications including Abilify , Seroquel , Cymbalta , hydroxyzine gabapentin  and Aricept , all has been held initially given her confusion, will reintroduce gradually at a lower dose to prevent withdrawals from these meds  Hypokalemia -replaced  Depression with anxiety Mild dementia  - Please see above discussion.   Hypothyroidism -Continue with Synthroid   Hypertension -Blood pressure started to increase, she is on valsartan at home, will resume will add as needed hydralazine    history of previous bacteremias - Follow-up blood culture closely.   DVT prophylaxis: (Lovenox /) Code Status: (Full) Family Communication: (discussed with caregiver Victoria at bedside 12/28, goal to reach daughter 12/28, 12/29)  Disposition:   Status is: Inpatient    Consultants:  none   Subjective:  confused, restless over last 24 hours, she is having loose bowel movements, she is afebrile.   Objective: Vitals:   10/28/24 1948 10/28/24 2339 10/29/24 0413 10/29/24 0805  BP: 127/73 (!) 120/91 (!) 118/97 127/86  Pulse: 85 79 (!) 57 88  Resp: 18 18 16    Temp: 98.9 F (37.2 C) 97.6 F (36.4 C) 97.6 F (36.4 C) 97.7 F (36.5 C)  TempSrc: Axillary Axillary Axillary Oral  SpO2: 98% 97% 99% 97%  Weight:      Height:        Intake/Output Summary (Last  24 hours) at 10/29/2024 1003 Last data filed at 10/29/2024 0800 Gross per 24 hour  Intake 1400.19 ml  Output --  Net 1400.19 ml   Filed Weights   10/26/24 1140  Weight: 77.1 kg    Examination:  Awake Alert,  frail, deconditioned, significantly confused, restless  awake and interactive today, oriented x 2 more appropriate but still confused Scattered rhonchi, diminished air entry Regular rate and rhythm +ve B.Sounds No Cyanosis, Clubbing or edema, No new Rash or bruise      Data Reviewed: I have personally reviewed following labs and imaging studies  CBC: Recent Labs  Lab 10/26/24 1445 10/26/24 2130 10/27/24 0339 10/28/24 0602 10/29/24 0422  WBC 16.1* 19.1* 12.5* 9.0 4.8  NEUTROABS 14.6*  --   --   --   --   HGB 14.0 13.5 10.9* 12.2 11.1*  HCT 44.3 40.2 32.0* 37.6 33.7*  MCV 104.5* 99.0 96.7 100.8* 100.6*  PLT 181 190 156 145* 137*    Basic Metabolic Panel: Recent Labs  Lab 10/26/24 1328 10/26/24 2130 10/27/24 0339 10/28/24 0602 10/29/24 0422  NA 141  --  140 140 140  K 4.2  --  3.1* 4.3 4.2  CL 101  --  106 107 107  CO2 26  --  25 17* 19*  GLUCOSE 91  --  107* 83 130*  BUN 11  --  15 16 16   CREATININE 0.81 0.88 0.73 0.61 0.60  CALCIUM 9.5  --  7.7* 8.5* 8.3*  MG  --   --   --  2.4 2.6*  PHOS  --   --   --  1.8* 2.6    GFR: Estimated Creatinine Clearance: 60.4 mL/min (by C-G formula based on SCr of 0.6 mg/dL).  Liver Function Tests: Recent Labs  Lab 10/26/24 1328 10/27/24 0339  AST 37 23  ALT 25 15  ALKPHOS 69 56  BILITOT 0.5 0.3  PROT 7.6 5.6*  ALBUMIN  4.7 3.4*    CBG: No results for input(s): GLUCAP in the last 168 hours.   Recent Results (from the past 240 hours)  Resp panel by RT-PCR (RSV, Flu A&B, Covid) Anterior Nasal Swab     Status: None   Collection Time: 10/26/24 12:08 PM   Specimen: Anterior Nasal Swab  Result Value Ref Range Status   SARS Coronavirus 2 by RT PCR NEGATIVE NEGATIVE Final   Influenza A by PCR NEGATIVE NEGATIVE Final   Influenza B by PCR NEGATIVE NEGATIVE Final    Comment: (NOTE) The Xpert Xpress SARS-CoV-2/FLU/RSV plus assay is intended as an aid in the diagnosis of influenza from Nasopharyngeal swab specimens  and should not be used as a sole basis for treatment. Nasal washings and aspirates are unacceptable for Xpert Xpress SARS-CoV-2/FLU/RSV testing.  Fact Sheet for Patients: bloggercourse.com  Fact Sheet for Healthcare Providers: seriousbroker.it  This test is not yet approved or cleared by the [place]  FDA and has been authorized for detection and/or diagnosis of SARS-CoV-2 by FDA under an Emergency Use Authorization (EUA). This EUA will remain in effect (meaning this test can be used) for the duration of the COVID-19 declaration under Section 564(b)(1) of the Act, 21 U.S.C. section 360bbb-3(b)(1), unless the authorization is terminated or revoked.     Resp Syncytial Virus by PCR NEGATIVE NEGATIVE Final    Comment: (NOTE) Fact Sheet for Patients: bloggercourse.com  Fact Sheet for Healthcare Providers: seriousbroker.it  This test is not yet approved or cleared by the [place]  FDA and has  been authorized for detection and/or diagnosis of SARS-CoV-2 by FDA under an Emergency Use Authorization (EUA). This EUA will remain in effect (meaning this test can be used) for the duration of the COVID-19 declaration under Section 564(b)(1) of the Act, 21 U.S.C. section 360bbb-3(b)(1), unless the authorization is terminated or revoked.  Performed at [facility], [address], [place], [place] [address]   Blood Culture (routine x 2)     Status: None (Preliminary result)   Collection Time: 10/26/24 12:12 PM   Specimen: BLOOD LEFT WRIST  Result Value Ref Range Status   Specimen Description BLOOD LEFT WRIST  Final   Special Requests   Final    BOTTLES DRAWN AEROBIC AND ANAEROBIC Blood Culture results may not be optimal due to an inadequate volume of blood received in culture bottles   Culture   Final    NO GROWTH 3 DAYS Performed at [facility], [address]  [place], [place] [address]    Report Status PENDING  Incomplete  Blood Culture (routine x 2)     Status: None (Preliminary result)   Collection Time: 10/26/24  6:58 PM   Specimen: BLOOD LEFT WRIST  Result Value Ref Range Status   Specimen Description BLOOD LEFT WRIST  Final   Special Requests   Final    BOTTLES DRAWN AEROBIC AND ANAEROBIC Blood Culture results may not be optimal due to an inadequate volume of blood received in culture bottles   Culture   Final    NO GROWTH 3 DAYS Performed at [facility], [address] [place], [place] [address]    Report Status PENDING  Incomplete  Gastrointestinal Panel by PCR , Stool     Status: None   Collection Time: 10/27/24 12:08 AM   Specimen: Stool  Result Value Ref Range Status   Campylobacter species NOT DETECTED NOT DETECTED Final   Plesimonas shigelloides NOT DETECTED NOT DETECTED Final   Salmonella species NOT DETECTED NOT DETECTED Final   Yersinia enterocolitica NOT DETECTED NOT DETECTED Final   Vibrio species NOT DETECTED NOT DETECTED Final   Vibrio cholerae NOT DETECTED NOT DETECTED Final   Enteroaggregative E coli (EAEC) NOT DETECTED NOT DETECTED Final   Enteropathogenic E coli (EPEC) NOT DETECTED NOT DETECTED Final   Enterotoxigenic E coli (ETEC) NOT DETECTED NOT DETECTED Final   Shiga like toxin producing E coli (STEC) NOT DETECTED NOT DETECTED Final   Shigella/Enteroinvasive E coli (EIEC) NOT DETECTED NOT DETECTED Final   Cryptosporidium NOT DETECTED NOT DETECTED Final   Cyclospora cayetanensis NOT DETECTED NOT DETECTED Final   Entamoeba histolytica NOT DETECTED NOT DETECTED Final   Giardia lamblia NOT DETECTED NOT DETECTED Final   Adenovirus F40/41 NOT DETECTED NOT DETECTED Final   Astrovirus NOT DETECTED NOT DETECTED Final   Norovirus GI/GII NOT DETECTED NOT DETECTED Final   Rotavirus A NOT DETECTED NOT DETECTED Final   Sapovirus (I, II, IV, and V) NOT DETECTED NOT DETECTED Final    Comment: Performed at [facility], [address] [place], [place] [address]  Respiratory (~20 pathogens) panel by PCR     Status: None   Collection Time: 10/27/24  8:03 AM   Specimen: Nasopharyngeal Swab; Respiratory  Result Value Ref Range Status   Adenovirus NOT DETECTED NOT DETECTED Final   Coronavirus 229E NOT DETECTED NOT DETECTED Final    Comment: (NOTE) The Coronavirus on the Respiratory Panel, DOES NOT test for the novel  Coronavirus (2019 nCoV)  Coronavirus HKU1 NOT DETECTED NOT DETECTED Final   Coronavirus NL63 NOT DETECTED NOT DETECTED Final   Coronavirus OC43 NOT DETECTED NOT DETECTED Final   Metapneumovirus NOT DETECTED NOT DETECTED Final   Rhinovirus / Enterovirus NOT DETECTED NOT DETECTED Final   Influenza A NOT DETECTED NOT DETECTED Final   Influenza B NOT DETECTED NOT DETECTED Final   Parainfluenza Virus 1 NOT DETECTED NOT DETECTED Final   Parainfluenza Virus 2 NOT DETECTED NOT DETECTED Final   Parainfluenza Virus 3 NOT DETECTED NOT DETECTED Final   Parainfluenza Virus 4 NOT DETECTED NOT DETECTED Final   Respiratory Syncytial Virus NOT DETECTED NOT DETECTED Final   Bordetella pertussis NOT DETECTED NOT DETECTED Final   Bordetella Parapertussis NOT DETECTED NOT DETECTED Final   Chlamydophila pneumoniae NOT DETECTED NOT DETECTED Final   Mycoplasma pneumoniae NOT DETECTED NOT DETECTED Final    Comment: Performed at [facility], [address] [place], [place] [address]  C Difficile Quick Screen w PCR reflex     Status: None   Collection Time: 10/27/24  3:28 PM   Specimen: STOOL  Result Value Ref Range Status   C Diff antigen NEGATIVE NEGATIVE Final   C Diff toxin NEGATIVE NEGATIVE Final   C Diff interpretation No C. difficile detected.  Final    Comment: Performed at [facility], [address], [place], [place] [address]  C Difficile Quick Screen w PCR reflex     Status: None   Collection Time: 10/27/24  9:18 PM   Specimen: STOOL  Result Value Ref Range Status    C Diff antigen NEGATIVE NEGATIVE Final   C Diff toxin NEGATIVE NEGATIVE Final   C Diff interpretation No C. difficile detected.  Final    Comment: Performed at [facility], [address], [place], [place] [address]         Radiology Studies: No results found.       Scheduled Meds:  ARIPiprazole   5 mg Oral Daily   cholecalciferol   1,000 Units Oral Daily   vitamin B-12  1,000 mcg Oral Daily   donepezil   5 mg Oral QHS   doxycycline   100 mg Oral Q12H   enoxaparin  (LOVENOX ) injection  40 mg Subcutaneous Q24H   gabapentin   300 mg Oral BID   irbesartan   150 mg Oral Daily   levothyroxine   25 mcg Oral QAC breakfast   methylPREDNISolone  (SOLU-MEDROL ) injection  125 mg Intravenous Q8H   QUEtiapine   100 mg Oral QHS   Continuous Infusions:  cefTRIAXone  (ROCEPHIN )  IV Stopped (10/28/24 2215)     LOS: 3 days    Brayton Lye, MD [facility]   To contact the attending provider between 7A-7P or the covering provider during after hours 7P-7A, please log into the web site [email or web address] and access using universal [place] password for that web site. If you do not have the password, please call the hospital operator.  10/29/2024, 10:03 AM   "

## 2024-10-29 NOTE — Plan of Care (Signed)
 Patient is progressing with goals of care.    Problem: Education: Goal: Knowledge of General Education information will improve Description: Including pain rating scale, medication(s)/side effects and non-pharmacologic comfort measures Outcome: Progressing   Problem: Health Behavior/Discharge Planning: Goal: Ability to manage health-related needs will improve Outcome: Progressing   Problem: Clinical Measurements: Goal: Ability to maintain clinical measurements within normal limits will improve Outcome: Progressing Goal: Will remain free from infection Outcome: Progressing Goal: Diagnostic test results will improve Outcome: Progressing Goal: Respiratory complications will improve Outcome: Progressing Goal: Cardiovascular complication will be avoided Outcome: Progressing   Problem: Activity: Goal: Risk for activity intolerance will decrease Outcome: Progressing   Problem: Nutrition: Goal: Adequate nutrition will be maintained Outcome: Progressing   Problem: Coping: Goal: Level of anxiety will decrease Outcome: Progressing   Problem: Elimination: Goal: Will not experience complications related to bowel motility Outcome: Progressing Goal: Will not experience complications related to urinary retention Outcome: Progressing   Problem: Pain Managment: Goal: General experience of comfort will improve and/or be controlled Outcome: Progressing   Problem: Safety: Goal: Ability to remain free from injury will improve Outcome: Progressing   Problem: Skin Integrity: Goal: Risk for impaired skin integrity will decrease Outcome: Progressing   Problem: Fluid Volume: Goal: Hemodynamic stability will improve Outcome: Progressing   Problem: Clinical Measurements: Goal: Diagnostic test results will improve Outcome: Progressing Goal: Signs and symptoms of infection will decrease Outcome: Progressing   Problem: Respiratory: Goal: Ability to maintain adequate ventilation will  improve Outcome: Progressing   Problem: Activity: Goal: Ability to tolerate increased activity will improve Outcome: Progressing   Problem: Clinical Measurements: Goal: Ability to maintain a body temperature in the normal range will improve Outcome: Progressing   Problem: Respiratory: Goal: Ability to maintain adequate ventilation will improve Outcome: Progressing Goal: Ability to maintain a clear airway will improve Outcome: Progressing   Problem: Safety: Goal: Non-violent Restraint(s) Outcome: Progressing

## 2024-10-29 NOTE — Care Management Important Message (Signed)
 Important Message  Patient Details  Name: [name]: [id number] Date of Birth: [birth year]   Important Message Given:  Yes - Medicare IM     Claretta Deed 10/29/2024, 3:04 PM

## 2024-10-29 NOTE — Plan of Care (Signed)
  Problem: Clinical Measurements: Goal: Ability to maintain clinical measurements within normal limits will improve Outcome: Progressing Goal: Will remain free from infection Outcome: Progressing Goal: Respiratory complications will improve Outcome: Progressing   Problem: Coping: Goal: Level of anxiety will decrease Outcome: Progressing   Problem: Safety: Goal: Ability to remain free from injury will improve Outcome: Progressing   Problem: Skin Integrity: Goal: Risk for impaired skin integrity will decrease Outcome: Progressing

## 2024-10-29 NOTE — TOC Initial Note (Signed)
 Transition of Care [facility]) - Initial/Assessment Note    Patient Details  Name: [name]: [id number] Date of Birth: Feb 24, 1946  [facility]) CM/SW Contact:    Inocente GORMAN Kindle, LCSW Phone Number: 10/29/2024, 1:03 PM  Clinical Narrative:                 Patient admitted from home alone with part time caregiver and daughter assisting as needed, Patient undergoing workup for Sepsis due to pneumonia. Therapies consulted; patient uses a cane typically for ambulation.     Barriers to Discharge: Continued Medical Work up   Patient Goals and CMS Choice            Expected Discharge Plan and Services       Living arrangements for the past 2 months: Single Family Home                                      Prior Living Arrangements/Services Living arrangements for the past 2 months: Single Family Home Lives with:: Self Patient language and need for interpreter reviewed:: Yes        Need for Family Participation in Patient Care: Yes (Comment) Care giver support system in place?: Yes (comment) Current home services: DME (RW) Criminal Activity/Legal Involvement Pertinent to Current Situation/Hospitalization: No - Comment as needed  Activities of Daily Living   ADL Screening (condition at time of admission) Independently performs ADLs?: Yes (appropriate for developmental age) Is the patient deaf or have difficulty hearing?: Yes Does the patient have difficulty seeing, even when wearing glasses/contacts?: No Does the patient have difficulty concentrating, remembering, or making decisions?: Yes  Permission Sought/Granted                  Emotional Assessment Appearance:: Appears stated age Attitude/Demeanor/Rapport: Unable to Assess Affect (typically observed): Unable to Assess Orientation: : Oriented to Self, Oriented to Situation Alcohol / Substance Use: Not Applicable Psych Involvement: No (comment)  Admission diagnosis:  Generalized  weakness [R53.1] Frequent falls [R29.6] Sepsis due to pneumonia (HCC) [J18.9, A41.9] Community acquired pneumonia, unspecified laterality [J18.9] Sepsis without acute organ dysfunction, due to unspecified organism [facility]) [A41.9] Patient Active Problem List   Diagnosis Date Noted   Sepsis due to pneumonia (HCC) 10/26/2024   E coli bacteremia    Acute lower UTI    Sepsis due to undetermined organism (HCC) 09/19/2021   Hypokalemia 09/19/2021   Leukopenia 09/19/2021   Prolonged QT interval 09/19/2021   Arthritis of right knee    UTI (urinary tract infection) 06/26/2021   Severe sepsis (HCC) 06/25/2021   Acute metabolic encephalopathy 06/25/2021   Hypothyroid 06/25/2021   S/P total knee arthroplasty, right 06/09/2021   Fever    Staphylococcus aureus bacteremia with sepsis (HCC)    SIRS (systemic inflammatory response syndrome) (HCC) 11/30/2020   Lumbar radiculopathy 10/10/2019   Bilateral stenosis of lateral recess of lumbar spine 10/10/2019   Hemorrhoids, external without complications 06/17/2014   Depression    Anxiety    Rectal bleeding    History of radiation therapy    Malignant neoplasm of anal canal (HCC) 11/15/2011   Hemorrhoids, internal, with bleeding 09/07/2011   Acquired anal stenosis 09/07/2011   Hyperlipidemia 09/07/2011   Family history of breast cancer in sister 09/07/2011   PCP:  Teresa Channel, MD Pharmacy:   [facility] [phone] - [place], [place] - [address] AT  [address] [place] [address] Phone: [phone] Fax: [phone]  [id number] - [place], [place] - [address] AT [facility] [address] [facility] [address] [place] [address] Phone: [phone] Fax: [phone]     Social Drivers of Health (SDOH) Social History: SDOH Screenings   Food Insecurity: No Food Insecurity (10/26/2024)  Housing: Low Risk (10/26/2024)  Transportation Needs: No Transportation Needs (10/26/2024)   Utilities: Not At Risk (10/26/2024)  Depression (PHQ2-9): Low Risk (06/21/2022)  Social Connections: Moderately Integrated (10/26/2024)  Tobacco Use: Medium Risk (10/26/2024)   SDOH Interventions:     Readmission Risk Interventions     No data to display

## 2024-10-29 NOTE — Evaluation (Signed)
 Physical Therapy Evaluation Patient Details Name: [name]: [id number] DOB: Aug 17, 1946 Today's Date: 10/29/2024  History of Present Illness  78 y.o. female who presented to the ER 12/26 with generalized weakness, nausea and frequent urination since last night. Found to have severe sepsis, POA due to pneumonia.with medical history significant of squamous cell carcinoma of the anus, hyperlipidemia, anxiety disorder, depression, history of previous bacteremias including staph and E. coli, recurrent UTIs.   Clinical Impression  Pt admitted with above diagnosis. Questionable historian, not aligning perfectly with notes - pt from home alone, has an aide and daughter lives in town but works. Uses SPC/RW to mobilize, has had several falls this year. She required up to mod assist for bed mobility, sit to stand transitions from EOB, and lateral steps along bed for balance and sequencing with RW for support. Keeps eyes closed during most of session but conversant for the most part. Pleasant, oriented to self. Able to recognize she is in the hospital with cues but unaware of month/date/situation. Suspect patient will benefit most from continued inpatient follow up therapy, <3 hours/day. If she begins to improve rapidly, may be able to update recs. Will continue to follow and progress during acute admission.  Pt currently with functional limitations due to the deficits listed below (see PT Problem List). Pt will benefit from acute skilled PT to increase their independence and safety with mobility to allow discharge.           If plan is discharge home, recommend the following: A lot of help with walking and/or transfers;A lot of help with bathing/dressing/bathroom;Assistance with cooking/housework;Direct supervision/assist for medications management;Direct supervision/assist for financial management;Assist for transportation   Can travel by private vehicle   No (Likely very soon)    Equipment  Recommendations None recommended by PT (Pending confirmation of current equipment reported above)  Recommendations for Other Services       Functional Status Assessment Patient has had a recent decline in their functional status and demonstrates the ability to make significant improvements in function in a reasonable and predictable amount of time.     Precautions / Restrictions Precautions Precautions: Fall Recall of Precautions/Restrictions: Impaired Restrictions Weight Bearing Restrictions Per Provider Order: No      Mobility  Bed Mobility Overal bed mobility: Needs Assistance Bed Mobility: Rolling, Sidelying to Sit, Sit to Supine Rolling: Used rails, Mod assist Sidelying to sit: Min assist, HOB elevated, Used rails   Sit to supine: Min assist   General bed mobility comments: Mod assist to roll and bring LEs off bed with use of rail to pull. Min assist for trunk support to rise, and min assist for repositioning in bed with cues for awareness - she was able to bring BIL feet into bed.    Transfers Overall transfer level: Needs assistance Equipment used: Rolling walker (2 wheels) Transfers: Sit to/from Stand Sit to Stand: Mod assist           General transfer comment: Mod assist for boost to stand, posterior lean. Cues to keep eyes open with forward gaze. Progressed with lateral steps along bed, declined to pivot into recliner at this time. Mod assist for weight shift and to facilitate steps along bed with RW fors upport.    Ambulation/Gait             Pre-gait activities: weight shift, static march, min assist for balance    Careers Information Officer  Tilt Bed    Modified Rankin (Stroke Patients Only)       Balance Overall balance assessment: Needs assistance Sitting-balance support: Bilateral upper extremity supported, Feet supported Sitting balance-Leahy Scale: Poor     Standing balance support: Bilateral upper extremity  supported, During functional activity Standing balance-Leahy Scale: Poor                               Pertinent Vitals/Pain Pain Assessment Pain Assessment: No/denies pain    Home Living Family/patient expects to be discharged to:: Private residence Living Arrangements: Alone Available Help at Discharge: Family Type of Home: House Home Access: Stairs to enter Entrance Stairs-Rails: Left Entrance Stairs-Number of Steps: 3   Home Layout: One level Home Equipment: Agricultural Consultant (2 wheels);Shower seat;Cane - single point;Wheelchair - manual;Lift chair Additional Comments: Would confirm - pt with some confusion.    Prior Function Prior Level of Function : Independent/Modified Independent;History of Falls (last six months)             Mobility Comments: uses RW and SPC, + falls recently. ADLs Comments: Ind but does not drive.     Extremity/Trunk Assessment   Upper Extremity Assessment Upper Extremity Assessment: Defer to OT evaluation    Lower Extremity Assessment Lower Extremity Assessment: Generalized weakness;Difficult to assess due to impaired cognition       Communication   Communication Communication: No apparent difficulties    Cognition Arousal: Lethargic Behavior During Therapy: WFL for tasks assessed/performed   PT - Cognitive impairments: No family/caregiver present to determine baseline, Orientation, Awareness, Attention, Initiation, Sequencing, Problem solving, Safety/Judgement   Orientation impairments: Place, Time, Situation                   PT - Cognition Comments: With cues, able to recognize that she is at a hospital. Recalls chrismtas occurred recently but unsure what month or year this is. Following commands: Impaired Following commands impaired: Follows one step commands inconsistently, Follows one step commands with increased time     Cueing Cueing Techniques: Verbal cues, Tactile cues, Gestural cues     General  Comments General comments (skin integrity, edema, etc.): Educated on safety and awareness. Questionable historian, a friend dropped by and offered to clarify some misinformation she was providing about PLOF. VSS on [place]    Exercises     Assessment/Plan    PT Assessment Patient needs continued PT services  PT Problem List Decreased strength;Decreased range of motion;Decreased activity tolerance;Decreased balance;Decreased mobility;Decreased coordination;Decreased cognition;Decreased knowledge of use of DME;Decreased safety awareness;Decreased knowledge of precautions;Cardiopulmonary status limiting activity;Obesity       PT Treatment Interventions DME instruction;Gait training;Stair training;Functional mobility training;Therapeutic activities;Therapeutic exercise;Balance training;Neuromuscular re-education;Cognitive remediation;Patient/family education;Wheelchair mobility training    PT Goals (Current goals can be found in the Care Plan section)  Acute Rehab PT Goals Patient Stated Goal: none stated PT Goal Formulation: Patient unable to participate in goal setting Time For Goal Achievement: 11/12/24 Potential to Achieve Goals: Good    Frequency Min 2X/week     Co-evaluation               AM-PAC PT 6 Clicks Mobility  Outcome Measure Help needed turning from your back to your side while in a flat bed without using bedrails?: A Lot Help needed moving from lying on your back to sitting on the side of a flat bed without using bedrails?: A Little Help needed moving to and from  a bed to a chair (including a wheelchair)?: A Lot Help needed standing up from a chair using your arms (e.g., wheelchair or bedside chair)?: A Lot Help needed to walk in hospital room?: A Lot Help needed climbing 3-5 steps with a railing? : Total 6 Click Score: 12    End of Session Equipment Utilized During Treatment: Gait belt;Oxygen Activity Tolerance: Patient limited by fatigue;Patient limited by  lethargy Patient left: in bed;with call bell/phone within reach;with bed alarm set;with family/visitor present;with restraints reapplied Nurse Communication: Mobility status PT Visit Diagnosis: Unsteadiness on feet (R26.81);Other abnormalities of gait and mobility (R26.89);Repeated falls (R29.6);Muscle weakness (generalized) (M62.81);History of falling (Z91.81);Difficulty in walking, not elsewhere classified (R26.2);Other symptoms and signs involving the nervous system (R29.898)    Time: 1203-1227 PT Time Calculation (min) (ACUTE ONLY): 24 min   Charges:   PT Evaluation $PT Eval Low Complexity: 1 Low PT Treatments $Therapeutic Activity: 8-22 mins PT General Charges $$ ACUTE PT VISIT: 1 Visit         Leontine Roads, PT, DPT [facility]  Rehabilitation Services Physical Therapist Office: [phone] Website: [email or web address]   Leontine GORMAN Roads 10/29/2024, 1:29 PM

## 2024-10-29 NOTE — Evaluation (Addendum)
 Occupational Therapy Evaluation Patient Details Name: [name]: [id number] DOB: [birth year] Today's Date: 10/29/2024   History of Present Illness   78 y.o. female who presented to the ER 12/26 with generalized weakness, nausea and frequent urination since last night. Found to have severe sepsis, POA due to pneumonia.with medical history significant of squamous cell carcinoma of the anus, hyperlipidemia, anxiety disorder, depression, history of previous bacteremias including staph and E. coli, recurrent UTIs.     Clinical Impressions Patient admitted for the diagnosis above.  PTA she lives at home, daughter lives nearby and checks in, but she has a HHA 3x/wk to assist with iADL,meals, medications and community mobility.  Patient is much more alert than with PT earlier, but continues to decline mobility, and out of bed to the recliner.  Patient is hoping to return home, but needs to be more independent with ADL and mobility, as she does not have the needed 24 hour assist at home currently.  OT will continue efforts in the acute setting to address deficits, and Patient will benefit from continued inpatient follow up therapy, <3 hours/day.     If plan is discharge home, recommend the following:   A lot of help with walking and/or transfers;A lot of help with bathing/dressing/bathroom;Direct supervision/assist for medications management;Assist for transportation;Assistance with cooking/housework     Functional Status Assessment   Patient has had a recent decline in their functional status and demonstrates the ability to make significant improvements in function in a reasonable and predictable amount of time.     Equipment Recommendations   None recommended by OT     Recommendations for Other Services         Precautions/Restrictions   Precautions Precautions: Fall Recall of Precautions/Restrictions: Impaired Restrictions Weight Bearing Restrictions Per Provider  Order: No     Mobility Bed Mobility Overal bed mobility: Needs Assistance Bed Mobility: Supine to Sit, Sit to Supine     Supine to sit: Min assist Sit to supine: Min assist     Patient Response: Cooperative  Transfers Overall transfer level: Needs assistance Equipment used: Rolling walker (2 wheels) Transfers: Sit to/from Stand Sit to Stand: Mod assist, Min assist                  Balance Overall balance assessment: Needs assistance Sitting-balance support: Bilateral upper extremity supported, Feet supported Sitting balance-Leahy Scale: Fair     Standing balance support: Reliant on assistive device for balance Standing balance-Leahy Scale: Poor                             ADL either performed or assessed with clinical judgement   ADL Overall ADL's : Needs assistance/impaired Eating/Feeding: Set up;Bed level   Grooming: Wash/dry hands;Wash/dry face;Set up;Sitting   Upper Body Bathing: Minimal assistance;Sitting   Lower Body Bathing: Moderate assistance;Sit to/from stand;Sitting/lateral leans   Upper Body Dressing : Minimal assistance;Sitting   Lower Body Dressing: Moderate assistance;Sit to/from stand;Sitting/lateral leans   Toilet Transfer: Moderate assistance;Stand-pivot;Minimal assistance                   Vision Patient Visual Report: No change from baseline       Perception Perception: Not tested       Praxis Praxis: Not tested       Pertinent Vitals/Pain Pain Assessment Pain Assessment: No/denies pain     Extremity/Trunk Assessment Upper Extremity Assessment Upper Extremity Assessment: Overall WFL for tasks  assessed   Lower Extremity Assessment Lower Extremity Assessment: Defer to PT evaluation   Cervical / Trunk Assessment Cervical / Trunk Assessment: Other exceptions Cervical / Trunk Exceptions: Body habitus   Communication Communication Communication: No apparent difficulties   Cognition Arousal:  Alert Behavior During Therapy: WFL for tasks assessed/performed Cognition: History of cognitive impairments             OT - Cognition Comments: Most notes suggest declining memory                 Following commands: Intact       Cueing  General Comments   Cueing Techniques: Verbal cues;Tactile cues;Gestural cues  VSS on supplemental O2   Exercises     Shoulder Instructions      Home Living Family/patient expects to be discharged to:: Private residence Living Arrangements: Alone Available Help at Discharge: Family;Personal care attendant Type of Home: House Home Access: Stairs to enter;Ramped entrance Entrance Stairs-Number of Steps: Patient told OT she had a ramp.  Also, said when you get old, it's tough to remember things. Entrance Stairs-Rails: Left Home Layout: One level     Bathroom Shower/Tub: Producer, Television/film/video: Standard Bathroom Accessibility: Yes   Home Equipment: Agricultural Consultant (2 wheels);Shower seat;Cane - single point;Wheelchair - manual;Lift chair   Additional Comments: Patient stating WIS, shower seat, HH shower head and grabbars in shower.  To OT      Prior Functioning/Environment Prior Level of Function : Independent/Modified Independent;History of Falls (last six months)             Mobility Comments: uses RW and SPC, + falls recently. ADLs Comments: Patient stating HHA 3x/wk for medication, iADL, meals and community mobility.  Daughter lives nearby, but only checks on her.    OT Problem List: Decreased strength;Decreased activity tolerance;Impaired balance (sitting and/or standing);Decreased safety awareness;Decreased cognition   OT Treatment/Interventions: Self-care/ADL training;Therapeutic activities;Patient/family education;Balance training;DME and/or AE instruction      OT Goals(Current goals can be found in the care plan section)   Acute Rehab OT Goals Patient Stated Goal: Return home OT Goal  Formulation: With patient Time For Goal Achievement: 11/12/24 Potential to Achieve Goals: Good   OT Frequency:  Min 2X/week    Co-evaluation              AM-PAC OT 6 Clicks Daily Activity     Outcome Measure Help from another person eating meals?: A Little Help from another person taking care of personal grooming?: A Little Help from another person toileting, which includes using toliet, bedpan, or urinal?: A Lot Help from another person bathing (including washing, rinsing, drying)?: A Lot Help from another person to put on and taking off regular upper body clothing?: A Little Help from another person to put on and taking off regular lower body clothing?: A Lot 6 Click Score: 15   End of Session Equipment Utilized During Treatment: Rolling walker (2 wheels);Gait belt Nurse Communication: Mobility status  Activity Tolerance: Patient tolerated treatment well Patient left: in bed;with call bell/phone within reach;with bed alarm set  OT Visit Diagnosis: Unsteadiness on feet (R26.81);Muscle weakness (generalized) (M62.81);History of falling (Z91.81);Other symptoms and signs involving cognitive function                Time: 1441-1501 OT Time Calculation (min): 20 min Charges:  OT General Charges $OT Visit: 1 Visit OT Evaluation $OT Eval Moderate Complexity: 1 Mod  10/29/2024  RP, OTR/L  Acute Rehabilitation Services  Office:  [phone]   Charlie JONETTA Halsted 10/29/2024, 3:22 PM

## 2024-10-30 LAB — BASIC METABOLIC PANEL WITH GFR
Anion gap: 10 (ref 5–15)
BUN: 22 mg/dL (ref 8–23)
CO2: 24 mmol/L (ref 22–32)
Calcium: 8.8 mg/dL — ABNORMAL LOW (ref 8.9–10.3)
Chloride: 108 mmol/L (ref 98–111)
Creatinine, Ser: 0.58 mg/dL (ref 0.44–1.00)
GFR, Estimated: >60 mL/min
Glucose, Bld: 109 mg/dL — ABNORMAL HIGH (ref 70–99)
Potassium: 4.1 mmol/L (ref 3.5–5.1)
Sodium: 143 mmol/L (ref 135–145)

## 2024-10-30 LAB — PHOSPHORUS: Phosphorus: 1.9 mg/dL — ABNORMAL LOW (ref 2.5–4.6)

## 2024-10-30 LAB — CBC
HCT: 35 % — ABNORMAL LOW (ref 36.0–46.0)
Hemoglobin: 11.3 g/dL — ABNORMAL LOW (ref 12.0–15.0)
MCH: 31.9 pg (ref 26.0–34.0)
MCHC: 32.3 g/dL (ref 30.0–36.0)
MCV: 98.9 fL (ref 80.0–100.0)
Platelets: 183 K/uL (ref 150–400)
RBC: 3.54 MIL/uL — ABNORMAL LOW (ref 3.87–5.11)
RDW: 13.9 % (ref 11.5–15.5)
WBC: 7.1 K/uL (ref 4.0–10.5)
nRBC: 0 % (ref 0.0–0.2)

## 2024-10-30 LAB — MAGNESIUM: Magnesium: 2.7 mg/dL — ABNORMAL HIGH (ref 1.7–2.4)

## 2024-10-30 MED ORDER — K PHOS MONO-SOD PHOS DI & MONO 155-852-130 MG PO TABS
500.0000 mg | ORAL_TABLET | Freq: Three times a day (TID) | ORAL | Status: AC
  Administered 2024-10-30 – 2024-10-31 (×4): 500 mg via ORAL
  Filled 2024-10-30 (×4): qty 2

## 2024-10-30 NOTE — Plan of Care (Signed)
 Patient is progressing towards goals of care.     Problem: Education: Goal: Knowledge of General Education information will improve Description: Including pain rating scale, medication(s)/side effects and non-pharmacologic comfort measures Outcome: Progressing   Problem: Health Behavior/Discharge Planning: Goal: Ability to manage health-related needs will improve Outcome: Progressing   Problem: Clinical Measurements: Goal: Ability to maintain clinical measurements within normal limits will improve Outcome: Progressing Goal: Will remain free from infection Outcome: Progressing Goal: Diagnostic test results will improve Outcome: Progressing Goal: Respiratory complications will improve Outcome: Progressing Goal: Cardiovascular complication will be avoided Outcome: Progressing   Problem: Activity: Goal: Risk for activity intolerance will decrease Outcome: Progressing   Problem: Nutrition: Goal: Adequate nutrition will be maintained Outcome: Progressing   Problem: Coping: Goal: Level of anxiety will decrease Outcome: Progressing   Problem: Elimination: Goal: Will not experience complications related to bowel motility Outcome: Progressing Goal: Will not experience complications related to urinary retention Outcome: Progressing   Problem: Pain Managment: Goal: General experience of comfort will improve and/or be controlled Outcome: Progressing   Problem: Safety: Goal: Ability to remain free from injury will improve Outcome: Progressing   Problem: Skin Integrity: Goal: Risk for impaired skin integrity will decrease Outcome: Progressing   Problem: Fluid Volume: Goal: Hemodynamic stability will improve Outcome: Progressing   Problem: Clinical Measurements: Goal: Diagnostic test results will improve Outcome: Progressing Goal: Signs and symptoms of infection will decrease Outcome: Progressing   Problem: Respiratory: Goal: Ability to maintain adequate ventilation  will improve Outcome: Progressing   Problem: Activity: Goal: Ability to tolerate increased activity will improve Outcome: Progressing   Problem: Clinical Measurements: Goal: Ability to maintain a body temperature in the normal range will improve Outcome: Progressing   Problem: Respiratory: Goal: Ability to maintain adequate ventilation will improve Outcome: Progressing Goal: Ability to maintain a clear airway will improve Outcome: Progressing   Problem: Safety: Goal: Non-violent Restraint(s) Outcome: Progressing

## 2024-10-30 NOTE — Plan of Care (Signed)
   Problem: Education: Goal: Knowledge of General Education information will improve Description: Including pain rating scale, medication(s)/side effects and non-pharmacologic comfort measures Outcome: Progressing   Problem: Pain Managment: Goal: General experience of comfort will improve and/or be controlled Outcome: Progressing   Problem: Safety: Goal: Ability to remain free from injury will improve Outcome: Progressing

## 2024-10-30 NOTE — Progress Notes (Addendum)
 " PROGRESS NOTE    [name]  FMW:[id number] DOB: 1946-01-19 DOA: 10/26/2024 PCP: Teresa Channel, MD   Chief Complaint  Patient presents with   Weakness   Urinary Frequency   Nausea    Brief Narrative:    [name] is a 78 y.o. female with medical history significant of squamous cell carcinoma of the anus, hyperlipidemia, anxiety disorder, depression, history of previous bacteremias including staph and E. coli, recurrent UTIs who presented to the ER with generalized weakness, nausea and frequent urination since last night.  She denied any subjective fever or chills but objectively she was found to be febrile.  Patient also met sepsis criteria with evidence of pneumonia and lactic acidosis.  Denied Sick contacts.  Respiratory screen is negative for flu COVID-19 or RSV.  Patient is being admitted with sepsis due to multifocal pneumonia.  She is hemodynamically stable.  Mildly elevated troponin otherwise.  Lactic acid is 4.1.   Assessment & Plan:   Principal Problem:   Sepsis due to pneumonia [facility]) Active Problems:   Hyperlipidemia   Malignant neoplasm of anal canal (HCC)   Depression   Anxiety   Hypothyroid  Severe sepsis, POA due to pneumonia - Sepsis, POA, present on admission, febrile, tachypneic, tachycardic, hypotensive with elevated lactic acid and white blood cell count -Eventually responded to multiple fluid boluses, no need for pressors. - Blood pressure has improved, will discontinue midodrine  -Continue with IV fluids. -Continue with pneumonia coverage with Rocephin  and azithromycin  - Influenza/RSV/COVID is negative, negative respiratory panel, strep pneumoniae antigen is negative, Legionella is pending, sputum culture is pending.   - She has diarrhea, C. difficile is negative, GI is negative as well   Hyperlipidemia - Resume statin when stable   history of malignant neoplasm of anal canal:   in remission.  Continue monitoring  Acute metabolic  encephalopathy Hospital delirium Dementia -Due to severe sepsis, -Per caregiver she has been diagnosed with dementia, but so far has been managed to live by herself she needs caregiver visits intermittently mainly for medication administration -CT head on admission with no acute findings -Mentation has improved initially, but then she developed significant confusion, restlessness, likely the setting of hospital delirium. -Patient on multiple psych medications including Abilify , Seroquel , Cymbalta , hydroxyzine gabapentin  and Aricept , all has been held initially given her confusion, will reintroduce gradually at a lower dose to prevent withdrawals from these meds, will continue today at current dose as she appears calm, cooperative  Hypokalemia Hypo-phosphatemia -replaced  Depression with anxiety Mild dementia  - Please see above discussion.   Hypothyroidism -Continue with Synthroid   Hypertension -Blood pressure started to increase, she is on valsartan at home, will resume will add as needed hydralazine    history of previous bacteremias - Follow-up blood culture closely.  So far remains negative   DVT prophylaxis: (Lovenox /) Code Status: (Full) Family Communication: (Unable to reach her daughter yet by phone, left voice messages today as well)  Addendum 4:30 PM: -=I was able to reach the daughter by phone, she does work as a veterinary surgeon and usually she is busy during the day to answer the phone.    Disposition: Recommendation for SNF, I have discussed with the daughter she is agreeable for SNF placement.  Status is: Inpatient    Consultants:  none   Subjective:  Remains confused, but she had a better night sleep as discussed with staff, less restless, today reports she is feeling better,   Objective: Vitals:   10/30/24 0200 10/30/24  0400 10/30/24 0747 10/30/24 1107  BP:  (!) 141/62 (!) 142/78 (!) 142/80  Pulse: (!) 53 60  (!) 57  Resp: 12 10  16   Temp:   97.7 F (36.5  C) 98.1 F (36.7 C)  TempSrc:  Oral Oral Axillary  SpO2: 92% 97%  100%  Weight:      Height:        Intake/Output Summary (Last 24 hours) at 10/30/2024 1505 Last data filed at 10/30/2024 1400 Gross per 24 hour  Intake 600 ml  Output --  Net 600 ml   Filed Weights   10/26/24 1140  Weight: 77.1 kg    Examination:  Awake, alert, frail, pleasant, more communicative and interactive today, oriented x 2  Improved air entry today, no wheezing  Regular rate and rhythm +ve B.Sounds No Cyanosis, Clubbing or edema, No new Rash or bruise      Data Reviewed: I have personally reviewed following labs and imaging studies  CBC: Recent Labs  Lab 10/26/24 1445 10/26/24 2130 10/27/24 0339 10/28/24 0602 10/29/24 0422 10/30/24 0355  WBC 16.1* 19.1* 12.5* 9.0 4.8 7.1  NEUTROABS 14.6*  --   --   --   --   --   HGB 14.0 13.5 10.9* 12.2 11.1* 11.3*  HCT 44.3 40.2 32.0* 37.6 33.7* 35.0*  MCV 104.5* 99.0 96.7 100.8* 100.6* 98.9  PLT 181 190 156 145* 137* 183    Basic Metabolic Panel: Recent Labs  Lab 10/26/24 1328 10/26/24 2130 10/27/24 0339 10/28/24 0602 10/29/24 0422 10/30/24 0355  NA 141  --  140 140 140 143  K 4.2  --  3.1* 4.3 4.2 4.1  CL 101  --  106 107 107 108  CO2 26  --  25 17* 19* 24  GLUCOSE 91  --  107* 83 130* 109*  BUN 11  --  15 16 16 22   CREATININE 0.81 0.88 0.73 0.61 0.60 0.58  CALCIUM 9.5  --  7.7* 8.5* 8.3* 8.8*  MG  --   --   --  2.4 2.6* 2.7*  PHOS  --   --   --  1.8* 2.6 1.9*    GFR: Estimated Creatinine Clearance: 60.4 mL/min (by C-G formula based on SCr of 0.58 mg/dL).  Liver Function Tests: Recent Labs  Lab 10/26/24 1328 10/27/24 0339  AST 37 23  ALT 25 15  ALKPHOS 69 56  BILITOT 0.5 0.3  PROT 7.6 5.6*  ALBUMIN  4.7 3.4*    CBG: No results for input(s): GLUCAP in the last 168 hours.   Recent Results (from the past 240 hours)  Resp panel by RT-PCR (RSV, Flu A&B, Covid) Anterior Nasal Swab     Status: None   Collection Time:  10/26/24 12:08 PM   Specimen: Anterior Nasal Swab  Result Value Ref Range Status   SARS Coronavirus 2 by RT PCR NEGATIVE NEGATIVE Final   Influenza A by PCR NEGATIVE NEGATIVE Final   Influenza B by PCR NEGATIVE NEGATIVE Final    Comment: (NOTE) The Xpert Xpress SARS-CoV-2/FLU/RSV plus assay is intended as an aid in the diagnosis of influenza from Nasopharyngeal swab specimens and should not be used as a sole basis for treatment. Nasal washings and aspirates are unacceptable for Xpert Xpress SARS-CoV-2/FLU/RSV testing.  Fact Sheet for Patients: bloggercourse.com  Fact Sheet for Healthcare Providers: seriousbroker.it  This test is not yet approved or cleared by the [place]  FDA and has been authorized for detection and/or diagnosis of SARS-CoV-2 by FDA  under an Emergency Use Authorization (EUA). This EUA will remain in effect (meaning this test can be used) for the duration of the COVID-19 declaration under Section 564(b)(1) of the Act, 21 U.S.C. section 360bbb-3(b)(1), unless the authorization is terminated or revoked.     Resp Syncytial Virus by PCR NEGATIVE NEGATIVE Final    Comment: (NOTE) Fact Sheet for Patients: bloggercourse.com  Fact Sheet for Healthcare Providers: seriousbroker.it  This test is not yet approved or cleared by the [place]  FDA and has been authorized for detection and/or diagnosis of SARS-CoV-2 by FDA under an Emergency Use Authorization (EUA). This EUA will remain in effect (meaning this test can be used) for the duration of the COVID-19 declaration under Section 564(b)(1) of the Act, 21 U.S.C. section 360bbb-3(b)(1), unless the authorization is terminated or revoked.  Performed at [facility], [address], [place], [place] [address]   Blood Culture (routine x 2)     Status: None (Preliminary result)   Collection Time: 10/26/24  12:12 PM   Specimen: BLOOD LEFT WRIST  Result Value Ref Range Status   Specimen Description BLOOD LEFT WRIST  Final   Special Requests   Final    BOTTLES DRAWN AEROBIC AND ANAEROBIC Blood Culture results may not be optimal due to an inadequate volume of blood received in culture bottles   Culture   Final    NO GROWTH 4 DAYS Performed at [facility], [address] [place], [place] [address]    Report Status PENDING  Incomplete  Blood Culture (routine x 2)     Status: None (Preliminary result)   Collection Time: 10/26/24  6:58 PM   Specimen: BLOOD LEFT WRIST  Result Value Ref Range Status   Specimen Description BLOOD LEFT WRIST  Final   Special Requests   Final    BOTTLES DRAWN AEROBIC AND ANAEROBIC Blood Culture results may not be optimal due to an inadequate volume of blood received in culture bottles   Culture   Final    NO GROWTH 4 DAYS Performed at [facility], [address], [place], [place] [address]    Report Status PENDING  Incomplete  Gastrointestinal Panel by PCR , Stool     Status: None   Collection Time: 10/27/24 12:08 AM   Specimen: Stool  Result Value Ref Range Status   Campylobacter species NOT DETECTED NOT DETECTED Final   Plesimonas shigelloides NOT DETECTED NOT DETECTED Final   Salmonella species NOT DETECTED NOT DETECTED Final   Yersinia enterocolitica NOT DETECTED NOT DETECTED Final   Vibrio species NOT DETECTED NOT DETECTED Final   Vibrio cholerae NOT DETECTED NOT DETECTED Final   Enteroaggregative E coli (EAEC) NOT DETECTED NOT DETECTED Final   Enteropathogenic E coli (EPEC) NOT DETECTED NOT DETECTED Final   Enterotoxigenic E coli (ETEC) NOT DETECTED NOT DETECTED Final   Shiga like toxin producing E coli (STEC) NOT DETECTED NOT DETECTED Final   Shigella/Enteroinvasive E coli (EIEC) NOT DETECTED NOT DETECTED Final   Cryptosporidium NOT DETECTED NOT DETECTED Final   Cyclospora cayetanensis NOT DETECTED NOT DETECTED Final   Entamoeba  histolytica NOT DETECTED NOT DETECTED Final   Giardia lamblia NOT DETECTED NOT DETECTED Final   Adenovirus F40/41 NOT DETECTED NOT DETECTED Final   Astrovirus NOT DETECTED NOT DETECTED Final   Norovirus GI/GII NOT DETECTED NOT DETECTED Final   Rotavirus A NOT DETECTED NOT DETECTED Final   Sapovirus (I, II, IV, and V) NOT DETECTED NOT DETECTED Final  Comment: Performed at [facility], [address] [place], [place] [address]  Respiratory (~20 pathogens) panel by PCR     Status: None   Collection Time: 10/27/24  8:03 AM   Specimen: Nasopharyngeal Swab; Respiratory  Result Value Ref Range Status   Adenovirus NOT DETECTED NOT DETECTED Final   Coronavirus 229E NOT DETECTED NOT DETECTED Final    Comment: (NOTE) The Coronavirus on the Respiratory Panel, DOES NOT test for the novel  Coronavirus (2019 nCoV)    Coronavirus HKU1 NOT DETECTED NOT DETECTED Final   Coronavirus NL63 NOT DETECTED NOT DETECTED Final   Coronavirus OC43 NOT DETECTED NOT DETECTED Final   Metapneumovirus NOT DETECTED NOT DETECTED Final   Rhinovirus / Enterovirus NOT DETECTED NOT DETECTED Final   Influenza A NOT DETECTED NOT DETECTED Final   Influenza B NOT DETECTED NOT DETECTED Final   Parainfluenza Virus 1 NOT DETECTED NOT DETECTED Final   Parainfluenza Virus 2 NOT DETECTED NOT DETECTED Final   Parainfluenza Virus 3 NOT DETECTED NOT DETECTED Final   Parainfluenza Virus 4 NOT DETECTED NOT DETECTED Final   Respiratory Syncytial Virus NOT DETECTED NOT DETECTED Final   Bordetella pertussis NOT DETECTED NOT DETECTED Final   Bordetella Parapertussis NOT DETECTED NOT DETECTED Final   Chlamydophila pneumoniae NOT DETECTED NOT DETECTED Final   Mycoplasma pneumoniae NOT DETECTED NOT DETECTED Final    Comment: Performed at [facility], [address], [place], [place] [address]  C Difficile Quick Screen w PCR reflex     Status: None   Collection Time: 10/27/24  3:28 PM   Specimen: STOOL  Result Value Ref  Range Status   C Diff antigen NEGATIVE NEGATIVE Final   C Diff toxin NEGATIVE NEGATIVE Final   C Diff interpretation No C. difficile detected.  Final    Comment: Performed at [facility], [address] [place], [place] [address]  C Difficile Quick Screen w PCR reflex     Status: None   Collection Time: 10/27/24  9:18 PM   Specimen: STOOL  Result Value Ref Range Status   C Diff antigen NEGATIVE NEGATIVE Final   C Diff toxin NEGATIVE NEGATIVE Final   C Diff interpretation No C. difficile detected.  Final    Comment: Performed at [facility], [address], [place], [place] [address]         Radiology Studies: No results found.       Scheduled Meds:  ARIPiprazole   10 mg Oral Daily   cholecalciferol   1,000 Units Oral Daily   vitamin B-12  1,000 mcg Oral Daily   donepezil   5 mg Oral QHS   doxycycline   100 mg Oral Q12H   DULoxetine   120 mg Oral Daily   enoxaparin  (LOVENOX ) injection  40 mg Subcutaneous Q24H   gabapentin   300 mg Oral BID   Gerhardt's butt cream   Topical BID   irbesartan   150 mg Oral Daily   levothyroxine   25 mcg Oral QAC breakfast   phosphorus  500 mg Oral TID   QUEtiapine   100 mg Oral QHS   Continuous Infusions:  cefTRIAXone  (ROCEPHIN )  IV 2 g (10/29/24 2136)     LOS: 4 days    Brayton Lye, MD [facility]   To contact the attending provider between 7A-7P or the covering provider during after hours 7P-7A, please log into the web site [email or web address] and access using universal [place] password for that web site. If you do not  have the password, please call the hospital operator.  10/30/2024, 3:05 PM   "

## 2024-10-30 NOTE — Progress Notes (Signed)
 "                        PROGRESS NOTE        PATIENT DETAILS Name: [name] Age: 78 y.o. Sex: female Date of Birth: [birth year] Admit Date: 10/26/2024 Admitting Physician Emery LITTIE Fuss, MD ERE:Typuz, Montie, MD  Brief Summary: Patient is a 78 y.o.  female with a history of HTN, anxiety disorder/depression, squamous cell carcinoma of the anus-who presented to the ED with weakness, nausea-diarrhea-fever-upon further evaluation-she was found to have severe sepsis secondary to PNA-Hospital course complicated by acute metabolic encephalopathy.  Significant events: 12/26>> admit to [facility]  Significant studies: 12/26>> CT head: No acute intracranial abnormality 12/26>> CT chest/abdomen/pelvis: Suspicious for bronchopneumonia involving left lower lobe.  Significant microbiology data: 12/26>> COVID/influenza/RSV PCR: Negative 12/26>> blood culture: No growth 12/27>> respiratory virus panel: Negative 12/27>> C. difficile PCR: Negative 12/27>> stool GI pathogen panel: Negative  Procedures: None  Consults: None  Subjective: Some loose stools overnight but otherwise uneventful night-sitter at bedside.  Objective: Vitals: Blood pressure (!) 159/74, pulse (!) 57, temperature 98 F (36.7 C), temperature source Oral, resp. rate 11, height 5' 5 (1.651 m), weight 77.1 kg, SpO2 94%.   Exam: Gen Exam:Alert awake-not in any distress HEENT:atraumatic, normocephalic Chest: B/L clear to auscultation anteriorly CVS:S1S2 regular Abdomen:soft non tender, non distended Extremities:no edema Neurology: Non focal-but has generalized weakness. Skin: no rash  Pertinent Labs/Radiology:    Latest Ref Rng & Units 10/31/2024    4:09 AM 10/30/2024    3:55 AM 10/29/2024    4:22 AM  CBC  WBC 4.0 - 10.5 K/uL 6.5  7.1  4.8   Hemoglobin 12.0 - 15.0 g/dL 88.2  88.6  88.8   Hematocrit 36.0 - 46.0 % 36.2  35.0  33.7   Platelets 150 - 400 K/uL 173  183  137     Lab Results  Component Value  Date   NA 146 (H) 10/31/2024   K 3.8 10/31/2024   CL 110 10/31/2024   CO2 24 10/31/2024      Assessment/Plan: Severe sepsis secondary to PNA Sepsis physiology has resolved Culture data as above Completed a course of Rocephin /doxycycline  on 12/31.  Acute metabolic encephalopathy Secondary to sepsis/PNA-superimposed on dementia Overall clinically improved-relatively awake/alert this morning. Continue to treat underlying sepsis physiology On multiple psychotropic medications-all held initially due to encephalopathy-plan is to gradually resume-currently on Seroquel /Abilify /Cymbalta /Neurontin  Maintain delirium precautions  Diarrhea ?  Antibiotic related Stool studies workup negative Imodium  as needed  Hypokalemia/hypophosphatemia Repleted-recheck periodically  Hypernatremia Probably secondary to diarrhea Hydrate with half-normal saline-recheck electrolytes tomorrow.  HTN BP stable Avapro   Hypothyroidism Synthroid   Depression/anxiety/dementia See above documentation  Prior history of recurrent bacteremia (E. coli/Staph aureus in 2022) Blood cultures negative so far  Debility/deconditioning Worse due to acute illness SNF planned on discharge  Remote history of anal cancer Stable for outpatient monitoring/surveillance  Code status:   Code Status: Full Code   DVT Prophylaxis: enoxaparin  (LOVENOX ) injection 40 mg Start: 10/26/24 2200   Family Communication: None at bedside   Disposition Plan: Status is: Inpatient Remains inpatient appropriate because: Severity of illness   Planned Discharge Destination:Skilled nursing facility   Diet: Diet Order             Diet Heart Room service appropriate? Yes; Fluid consistency: Thin  Diet effective now  Antimicrobial agents: Anti-infectives (From admission, onward)    Start     Dose/Rate Route Frequency Ordered Stop   10/29/24 1000  doxycycline  (VIBRA -TABS) tablet 100 mg        100  mg Oral Every 12 hours 10/28/24 1619     10/27/24 0830  azithromycin  (ZITHROMAX ) 500 mg in sodium chloride  0.9 % 250 mL IVPB  Status:  Discontinued        500 mg 250 mL/hr over 60 Minutes Intravenous Daily 10/27/24 0730 10/28/24 1619   10/26/24 2300  doxycycline  (VIBRAMYCIN ) 100 mg in sodium chloride  0.9 % 250 mL IVPB  Status:  Discontinued        100 mg 125 mL/hr over 120 Minutes Intravenous Every 12 hours 10/26/24 2052 10/27/24 0730   10/26/24 2200  cefTRIAXone  (ROCEPHIN ) 2 g in sodium chloride  0.9 % 100 mL IVPB        2 g 200 mL/hr over 30 Minutes Intravenous Every 24 hours 10/26/24 2052 10/30/24 2231   10/26/24 1600  ceFEPIme  (MAXIPIME ) 2 g in sodium chloride  0.9 % 100 mL IVPB        2 g 200 mL/hr over 30 Minutes Intravenous  Once 10/26/24 1548 10/26/24 1737   10/26/24 1600  metroNIDAZOLE  (FLAGYL ) IVPB 500 mg        500 mg 100 mL/hr over 60 Minutes Intravenous  Once 10/26/24 1548 10/26/24 1707   10/26/24 1600  vancomycin  (VANCOCIN ) IVPB 1000 mg/200 mL premix  Status:  Discontinued        1,000 mg 200 mL/hr over 60 Minutes Intravenous  Once 10/26/24 1548 10/26/24 1554   10/26/24 1600  vancomycin  (VANCOREADY) IVPB 1500 mg/300 mL        1,500 mg 150 mL/hr over 120 Minutes Intravenous  Once 10/26/24 1554 10/26/24 2004        MEDICATIONS: Scheduled Meds:  ARIPiprazole   10 mg Oral Daily   cholecalciferol   1,000 Units Oral Daily   vitamin B-12  1,000 mcg Oral Daily   donepezil   5 mg Oral QHS   doxycycline   100 mg Oral Q12H   DULoxetine   120 mg Oral Daily   enoxaparin  (LOVENOX ) injection  40 mg Subcutaneous Q24H   gabapentin   300 mg Oral BID   Gerhardt's butt cream   Topical BID   irbesartan   150 mg Oral Daily   levothyroxine   25 mcg Oral QAC breakfast   QUEtiapine   100 mg Oral QHS   Continuous Infusions:  sodium chloride  50 mL/hr at 10/31/24 0854   PRN Meds:.acetaminophen  **OR** acetaminophen , haloperidol  lactate, hydrALAZINE , ipratropium-albuterol , LORazepam ,  promethazine    I have personally reviewed following labs and imaging studies  LABORATORY DATA: CBC: Recent Labs  Lab 10/26/24 1445 10/26/24 2130 10/27/24 0339 10/28/24 0602 10/29/24 0422 10/30/24 0355 10/31/24 0409  WBC 16.1*   < > 12.5* 9.0 4.8 7.1 6.5  NEUTROABS 14.6*  --   --   --   --   --   --   HGB 14.0   < > 10.9* 12.2 11.1* 11.3* 11.7*  HCT 44.3   < > 32.0* 37.6 33.7* 35.0* 36.2  MCV 104.5*   < > 96.7 100.8* 100.6* 98.9 100.8*  PLT 181   < > 156 145* 137* 183 173   < > = values in this interval not displayed.    Basic Metabolic Panel: Recent Labs  Lab 10/27/24 0339 10/28/24 0602 10/29/24 0422 10/30/24 0355 10/31/24 0409  NA 140 140 140 143 146*  K 3.1* 4.3 4.2  4.1 3.8  CL 106 107 107 108 110  CO2 25 17* 19* 24 24  GLUCOSE 107* 83 130* 109* 78  BUN 15 16 16 22  25*  CREATININE 0.73 0.61 0.60 0.58 0.63  CALCIUM 7.7* 8.5* 8.3* 8.8* 8.5*  MG  --  2.4 2.6* 2.7* 2.4  PHOS  --  1.8* 2.6 1.9* 3.3    GFR: Estimated Creatinine Clearance: 60.4 mL/min (by C-G formula based on SCr of 0.63 mg/dL).  Liver Function Tests: Recent Labs  Lab 10/26/24 1328 10/27/24 0339  AST 37 23  ALT 25 15  ALKPHOS 69 56  BILITOT 0.5 0.3  PROT 7.6 5.6*  ALBUMIN  4.7 3.4*   Recent Labs  Lab 10/26/24 1328  LIPASE 20   No results for input(s): AMMONIA in the last 168 hours.  Coagulation Profile: Recent Labs  Lab 10/26/24 1328 10/27/24 0339  INR 1.0 1.1    Cardiac Enzymes: No results for input(s): CKTOTAL, CKMB, CKMBINDEX, TROPONINI in the last 168 hours.  BNP (last 3 results) No results for input(s): PROBNP in the last 8760 hours.  Lipid Profile: No results for input(s): CHOL, HDL, LDLCALC, TRIG, CHOLHDL, LDLDIRECT in the last 72 hours.  Thyroid  Function Tests: No results for input(s): TSH, T4TOTAL, FREET4, T3FREE, THYROIDAB in the last 72 hours.  Anemia Panel: No results for input(s): VITAMINB12, FOLATE, FERRITIN,  TIBC, IRON, RETICCTPCT in the last 72 hours.  Urine analysis:    Component Value Date/Time   COLORURINE YELLOW 10/26/2024 1307   APPEARANCEUR CLEAR 10/26/2024 1307   LABSPEC 1.012 10/26/2024 1307   PHURINE 5.0 10/26/2024 1307   GLUCOSEU NEGATIVE 10/26/2024 1307   HGBUR NEGATIVE 10/26/2024 1307   BILIRUBINUR NEGATIVE 10/26/2024 1307   KETONESUR NEGATIVE 10/26/2024 1307   PROTEINUR NEGATIVE 10/26/2024 1307   UROBILINOGEN 0.2 11/08/2011 1445   NITRITE NEGATIVE 10/26/2024 1307   LEUKOCYTESUR NEGATIVE 10/26/2024 1307    Sepsis Labs: Lactic Acid, Venous    Component Value Date/Time   LATICACIDVEN 1.2 10/27/2024 9660    MICROBIOLOGY: Recent Results (from the past 240 hours)  Resp panel by RT-PCR (RSV, Flu A&B, Covid) Anterior Nasal Swab     Status: None   Collection Time: 10/26/24 12:08 PM   Specimen: Anterior Nasal Swab  Result Value Ref Range Status   SARS Coronavirus 2 by RT PCR NEGATIVE NEGATIVE Final   Influenza A by PCR NEGATIVE NEGATIVE Final   Influenza B by PCR NEGATIVE NEGATIVE Final    Comment: (NOTE) The Xpert Xpress SARS-CoV-2/FLU/RSV plus assay is intended as an aid in the diagnosis of influenza from Nasopharyngeal swab specimens and should not be used as a sole basis for treatment. Nasal washings and aspirates are unacceptable for Xpert Xpress SARS-CoV-2/FLU/RSV testing.  Fact Sheet for Patients: bloggercourse.com  Fact Sheet for Healthcare Providers: seriousbroker.it  This test is not yet approved or cleared by the [place]  FDA and has been authorized for detection and/or diagnosis of SARS-CoV-2 by FDA under an Emergency Use Authorization (EUA). This EUA will remain in effect (meaning this test can be used) for the duration of the COVID-19 declaration under Section 564(b)(1) of the Act, 21 U.S.C. section 360bbb-3(b)(1), unless the authorization is terminated or revoked.     Resp Syncytial  Virus by PCR NEGATIVE NEGATIVE Final    Comment: (NOTE) Fact Sheet for Patients: bloggercourse.com  Fact Sheet for Healthcare Providers: seriousbroker.it  This test is not yet approved or cleared by the [place]  FDA and has been authorized for detection and/or diagnosis  of SARS-CoV-2 by FDA under an Emergency Use Authorization (EUA). This EUA will remain in effect (meaning this test can be used) for the duration of the COVID-19 declaration under Section 564(b)(1) of the Act, 21 U.S.C. section 360bbb-3(b)(1), unless the authorization is terminated or revoked.  Performed at [facility], [address], [place], [place] [address]   Blood Culture (routine x 2)     Status: None   Collection Time: 10/26/24 12:12 PM   Specimen: BLOOD LEFT WRIST  Result Value Ref Range Status   Specimen Description BLOOD LEFT WRIST  Final   Special Requests   Final    BOTTLES DRAWN AEROBIC AND ANAEROBIC Blood Culture results may not be optimal due to an inadequate volume of blood received in culture bottles   Culture   Final    NO GROWTH 5 DAYS Performed at [facility], [address] [place], [place] [address]    Report Status 10/31/2024 FINAL  Final  Blood Culture (routine x 2)     Status: None   Collection Time: 10/26/24  6:58 PM   Specimen: BLOOD LEFT WRIST  Result Value Ref Range Status   Specimen Description BLOOD LEFT WRIST  Final   Special Requests   Final    BOTTLES DRAWN AEROBIC AND ANAEROBIC Blood Culture results may not be optimal due to an inadequate volume of blood received in culture bottles   Culture   Final    NO GROWTH 5 DAYS Performed at [facility], [address], [place], [place] [address]    Report Status 10/31/2024 FINAL  Final  Gastrointestinal Panel by PCR , Stool     Status: None   Collection Time: 10/27/24 12:08 AM   Specimen: Stool  Result Value Ref Range Status   Campylobacter species NOT  DETECTED NOT DETECTED Final   Plesimonas shigelloides NOT DETECTED NOT DETECTED Final   Salmonella species NOT DETECTED NOT DETECTED Final   Yersinia enterocolitica NOT DETECTED NOT DETECTED Final   Vibrio species NOT DETECTED NOT DETECTED Final   Vibrio cholerae NOT DETECTED NOT DETECTED Final   Enteroaggregative E coli (EAEC) NOT DETECTED NOT DETECTED Final   Enteropathogenic E coli (EPEC) NOT DETECTED NOT DETECTED Final   Enterotoxigenic E coli (ETEC) NOT DETECTED NOT DETECTED Final   Shiga like toxin producing E coli (STEC) NOT DETECTED NOT DETECTED Final   Shigella/Enteroinvasive E coli (EIEC) NOT DETECTED NOT DETECTED Final   Cryptosporidium NOT DETECTED NOT DETECTED Final   Cyclospora cayetanensis NOT DETECTED NOT DETECTED Final   Entamoeba histolytica NOT DETECTED NOT DETECTED Final   Giardia lamblia NOT DETECTED NOT DETECTED Final   Adenovirus F40/41 NOT DETECTED NOT DETECTED Final   Astrovirus NOT DETECTED NOT DETECTED Final   Norovirus GI/GII NOT DETECTED NOT DETECTED Final   Rotavirus A NOT DETECTED NOT DETECTED Final   Sapovirus (I, II, IV, and V) NOT DETECTED NOT DETECTED Final    Comment: Performed at [facility], [address] [place], [place] [address]  Respiratory (~20 pathogens) panel by PCR     Status: None   Collection Time: 10/27/24  8:03 AM   Specimen: Nasopharyngeal Swab; Respiratory  Result Value Ref Range Status   Adenovirus NOT DETECTED NOT DETECTED Final   Coronavirus 229E NOT DETECTED NOT DETECTED Final    Comment: (NOTE) The Coronavirus on the Respiratory Panel, DOES NOT test for the novel  Coronavirus (2019 nCoV)    Coronavirus HKU1 NOT DETECTED NOT DETECTED  Final   Coronavirus NL63 NOT DETECTED NOT DETECTED Final   Coronavirus OC43 NOT DETECTED NOT DETECTED Final   Metapneumovirus NOT DETECTED NOT DETECTED Final   Rhinovirus / Enterovirus NOT DETECTED NOT DETECTED Final   Influenza A NOT DETECTED NOT DETECTED Final   Influenza B NOT  DETECTED NOT DETECTED Final   Parainfluenza Virus 1 NOT DETECTED NOT DETECTED Final   Parainfluenza Virus 2 NOT DETECTED NOT DETECTED Final   Parainfluenza Virus 3 NOT DETECTED NOT DETECTED Final   Parainfluenza Virus 4 NOT DETECTED NOT DETECTED Final   Respiratory Syncytial Virus NOT DETECTED NOT DETECTED Final   Bordetella pertussis NOT DETECTED NOT DETECTED Final   Bordetella Parapertussis NOT DETECTED NOT DETECTED Final   Chlamydophila pneumoniae NOT DETECTED NOT DETECTED Final   Mycoplasma pneumoniae NOT DETECTED NOT DETECTED Final    Comment: Performed at [facility], [address] [place], [place] [address]  C Difficile Quick Screen w PCR reflex     Status: None   Collection Time: 10/27/24  3:28 PM   Specimen: STOOL  Result Value Ref Range Status   C Diff antigen NEGATIVE NEGATIVE Final   C Diff toxin NEGATIVE NEGATIVE Final   C Diff interpretation No C. difficile detected.  Final    Comment: Performed at [facility], [address], [place], [place] [address]  C Difficile Quick Screen w PCR reflex     Status: None   Collection Time: 10/27/24  9:18 PM   Specimen: STOOL  Result Value Ref Range Status   C Diff antigen NEGATIVE NEGATIVE Final   C Diff toxin NEGATIVE NEGATIVE Final   C Diff interpretation No C. difficile detected.  Final    Comment: Performed at [facility], [address], [place], [place] [address]    RADIOLOGY STUDIES/RESULTS: No results found.   LOS: 5 days   Donalda Applebaum, MD  [facility]    To contact the attending provider between 7A-7P or the covering provider during after hours 7P-7A, please log into the web site [email or web address] and access using universal [place] password for that web site. If you do not have the password, please call the hospital operator.  10/31/2024, 11:25 AM    "

## 2024-10-31 DIAGNOSIS — E785 Hyperlipidemia, unspecified: Secondary | ICD-10-CM | POA: Diagnosis not present

## 2024-10-31 DIAGNOSIS — A419 Sepsis, unspecified organism: Secondary | ICD-10-CM | POA: Diagnosis not present

## 2024-10-31 DIAGNOSIS — J189 Pneumonia, unspecified organism: Secondary | ICD-10-CM | POA: Diagnosis not present

## 2024-10-31 DIAGNOSIS — F419 Anxiety disorder, unspecified: Secondary | ICD-10-CM

## 2024-10-31 DIAGNOSIS — F32A Depression, unspecified: Secondary | ICD-10-CM | POA: Diagnosis not present

## 2024-10-31 LAB — CULTURE, BLOOD (ROUTINE X 2)
Culture: NO GROWTH
Culture: NO GROWTH

## 2024-10-31 LAB — CBC
HCT: 36.2 % (ref 36.0–46.0)
Hemoglobin: 11.7 g/dL — ABNORMAL LOW (ref 12.0–15.0)
MCH: 32.6 pg (ref 26.0–34.0)
MCHC: 32.3 g/dL (ref 30.0–36.0)
MCV: 100.8 fL — ABNORMAL HIGH (ref 80.0–100.0)
Platelets: 173 K/uL (ref 150–400)
RBC: 3.59 MIL/uL — ABNORMAL LOW (ref 3.87–5.11)
RDW: 14.1 % (ref 11.5–15.5)
WBC: 6.5 K/uL (ref 4.0–10.5)
nRBC: 0 % (ref 0.0–0.2)

## 2024-10-31 LAB — MAGNESIUM: Magnesium: 2.4 mg/dL (ref 1.7–2.4)

## 2024-10-31 LAB — BASIC METABOLIC PANEL WITH GFR
Anion gap: 11 (ref 5–15)
BUN: 25 mg/dL — ABNORMAL HIGH (ref 8–23)
CO2: 24 mmol/L (ref 22–32)
Calcium: 8.5 mg/dL — ABNORMAL LOW (ref 8.9–10.3)
Chloride: 110 mmol/L (ref 98–111)
Creatinine, Ser: 0.63 mg/dL (ref 0.44–1.00)
GFR, Estimated: >60 mL/min
Glucose, Bld: 78 mg/dL (ref 70–99)
Potassium: 3.8 mmol/L (ref 3.5–5.1)
Sodium: 146 mmol/L — ABNORMAL HIGH (ref 135–145)

## 2024-10-31 LAB — PHOSPHORUS: Phosphorus: 3.3 mg/dL (ref 2.5–4.6)

## 2024-10-31 LAB — LEGIONELLA PNEUMOPHILA SEROGP 1 UR AG: L. pneumophila Serogp 1 Ur Ag: NEGATIVE

## 2024-10-31 MED ORDER — LOPERAMIDE HCL 2 MG PO CAPS: 2.0000 mg | ORAL_CAPSULE | ORAL | Status: DC | PRN

## 2024-10-31 MED ORDER — SODIUM CHLORIDE 0.45 % IV SOLN: INTRAVENOUS | Status: DC

## 2024-10-31 NOTE — Progress Notes (Signed)
 Physical Therapy Treatment Patient Details Name: [name]: [id number] DOB: 1946/08/07 Today's Date: 10/31/2024   History of Present Illness 78 y.o. female who presented to the ER 12/26 with generalized weakness, nausea and frequent urination since last night. Found to have severe sepsis, POA due to pneumonia.with medical history significant of squamous cell carcinoma of the anus, hyperlipidemia, anxiety disorder, depression, history of previous bacteremias including staph and E. coli, recurrent UTIs.    PT Comments  Pt received in supine and agreeable to session with aide present and supportive. Pt's aide reports pt ambulates mod I with RW at baseline with assist for medication management. Pt able to pivot to recliner with min A and increased cues for sequencing and safety. Pt demonstrates quick fatigue and DOE requiring seated rest break. Pt able to perform 2 additional stands progressing to min A. Education provided on current recommendation for rehab prior to return home with pt verbalizing understanding. Pt continues to benefit from PT services to progress toward functional mobility goals.     If plan is discharge home, recommend the following: A lot of help with walking and/or transfers;A lot of help with bathing/dressing/bathroom;Assistance with cooking/housework;Direct supervision/assist for medications management;Direct supervision/assist for financial management;Assist for transportation   Can travel by private vehicle     No  Equipment Recommendations  None recommended by PT (Pending confirmation of current equipment reported above)    Recommendations for Other Services       Precautions / Restrictions Precautions Precautions: Fall Recall of Precautions/Restrictions: Impaired Restrictions Weight Bearing Restrictions Per Provider Order: No     Mobility  Bed Mobility Overal bed mobility: Needs Assistance Bed Mobility: Supine to Sit     Supine to sit: Min assist,  HOB elevated, Used rails     General bed mobility comments: cues for sequencing and assist for trunk elevation and scooting forward to EOB. increased time/effort with DOE noted    Transfers Overall transfer level: Needs assistance Equipment used: Rolling walker (2 wheels) Transfers: Sit to/from Stand, Bed to chair/wheelchair/BSC Sit to Stand: Mod assist, Min assist   Step pivot transfers: Min assist       General transfer comment: STS from EOB with mod A progressing to min A from recliner with cues for hand placement and anterior weight shift. Pivot to recliner with assist for RW and line management. cues for upright posture and sequencing    Ambulation/Gait                   Stairs             Wheelchair Mobility     Tilt Bed    Modified Rankin (Stroke Patients Only)       Balance Overall balance assessment: Needs assistance Sitting-balance support: Bilateral upper extremity supported, Feet supported Sitting balance-Leahy Scale: Fair Sitting balance - Comments: EOB   Standing balance support: Reliant on assistive device for balance, Bilateral upper extremity supported, During functional activity Standing balance-Leahy Scale: Poor Standing balance comment: reliant on RW and external support                            Communication Communication Communication: No apparent difficulties  Cognition Arousal: Alert Behavior During Therapy: WFL for tasks assessed/performed   PT - Cognitive impairments: History of cognitive impairments, Sequencing, Problem solving, Safety/Judgement  Following commands: Intact Following commands impaired: Follows one step commands with increased time    Cueing Cueing Techniques: Verbal cues, Tactile cues, Gestural cues  Exercises      General Comments        Pertinent Vitals/Pain Pain Assessment Pain Assessment: No/denies pain     PT Goals (current goals can now be  found in the care plan section) Acute Rehab PT Goals Patient Stated Goal: none stated PT Goal Formulation: Patient unable to participate in goal setting Time For Goal Achievement: 11/12/24 Progress towards PT goals: Progressing toward goals    Frequency    Min 2X/week       AM-PAC PT 6 Clicks Mobility   Outcome Measure  Help needed turning from your back to your side while in a flat bed without using bedrails?: A Little Help needed moving from lying on your back to sitting on the side of a flat bed without using bedrails?: A Little Help needed moving to and from a bed to a chair (including a wheelchair)?: A Lot Help needed standing up from a chair using your arms (e.g., wheelchair or bedside chair)?: A Lot Help needed to walk in hospital room?: A Lot Help needed climbing 3-5 steps with a railing? : Total 6 Click Score: 13    End of Session Equipment Utilized During Treatment: Gait belt;Oxygen Activity Tolerance: Patient limited by fatigue;Patient tolerated treatment well Patient left: in chair;with nursing/sitter in room;with call bell/phone within reach;with family/visitor present Nurse Communication: Mobility status PT Visit Diagnosis: Unsteadiness on feet (R26.81);Other abnormalities of gait and mobility (R26.89);Repeated falls (R29.6);Muscle weakness (generalized) (M62.81);History of falling (Z91.81);Difficulty in walking, not elsewhere classified (R26.2);Other symptoms and signs involving the nervous system (R29.898)     Time: 9054-8996 PT Time Calculation (min) (ACUTE ONLY): 18 min  Charges:    $Therapeutic Activity: 8-22 mins PT General Charges $$ ACUTE PT VISIT: 1 Visit                    Darryle George, PTA Acute Rehabilitation Services Secure Chat Preferred  [phone]    Darryle George 10/31/2024, 10:26 AM

## 2024-10-31 NOTE — TOC Progression Note (Addendum)
 Transition of Care [facility]) - Progression Note    Patient Details  Name: [name]: [id number] Date of Birth: [birth year]  Transition of Care [facility]) CM/SW Contact  Inocente GORMAN Kindle, LCSW Phone Number: 10/31/2024, 1:38 PM  Clinical Narrative:    12pm-CSW received consult for possible SNF placement at time of discharge. CSW spoke with patient. Patient reported that she lives home alone and her daughter lives nearby. Patient expressed understanding of PT recommendation and is agreeable to SNF placement at time of discharge. Patient reports preference for [facility]. CSW discussed insurance authorization process and will provide Medicare SNF ratings list. CSW will send out referrals for review and provide bed offers as available. Patient stated CSW could reach out to her daughter with SNF offers.     2:57 PM-CSW left voicemail for patient's daughter.   4:30 PM-Patient's daughter returned call and requested [employer].  [facility] has one bed for patient tomorrow pending insurance approval. CSW initiated and received insurance approval, Ref# [id number], effective 10/31/2024-11/02/2024.   CSW updated daughter who requested [facility] for transport.       Skilled Nursing Rehab Facilities-   [email or web address]   Ratings out of 5 stars (5 the highest)  Name Address  Phone # Quality Care Staffing Health Inspection Overall  [facility] [address] [phone] 2 1 2 1   [facility] [address] [place] [phone] 5 2 4 5   [facility] [address], [facility] [phone] [facility] [place] [facility] [address] [place] [phone] 4 3 4 4   [facility]  [address], [place] [phone] 4 3 5 5   [facility] [address] [facility] [phone] 5 3 2 3   [facility] [address] [place] [phone] 5 1 2 2   [facility] [id number]  [place] [phone] 3 4 4 4   [facility] ([facility]) [address] [place] [phone] 1 3 3 2   [facility] [address] [place], [place] [phone] 4 2 2 2   [facility] [address] [place], [place] [phone] 2 1 1 1   Clotilda Pereyra [address] [place] [phone] 2 4 4 4   [facility] [address] [place] [phone] 3 1 3 2   [facility]) [address], [place] [phone] 1 2 4 3           [facility] [address] [place] [phone] 3 1 5 4   [facility] [address] [place] [phone] 4 2 1 1   [facility]  [address], [place] [phone] 2 4 1 1   [facility] [place] [address] [phone] 2 2 5 5   [facility] [address] [place] [address], [place] [phone] 2 2 3 3           [facility] [address], [place] [phone] 2 1 2 1   [facility] (No UHC) [address], [place] [phone] 4 3 4 4   [facility] [address] [facility] [phone] 3  5 5   [facility] [address], [place] [phone] 4 2 1 1   [facility] [address] [place] [facility] [phone] 3 1 2 1   [facility] [address] [place] [phone] 1 3 3 2   [facility] [address] [place] [phone] 2 2 3 3   [facility] [address] [place], [place] [phone] 2 1 4 3   [facility] [place] [facility] [address]  [facility] [address], [facility] [phone] 2 1 1 1   [facility] [address], [place] [phone] 2 1 2 1   [facility] [address] [place] [phone] 3 1 2 1           [facility] [address], [place] [phone] 4 1 3 2   [facility] [address] [place]  [phone] 2 4 4 4   [facility] (No Humana) [address], [place] [phone] 3 2 5 5   [facility]) [address], [place] [phone] 3 2 3  3   [facility] [address], [place] [phone] 5 3 5 5   [facility] [address] [place], [place] [phone] 2 1 1 1   [facility] [address] [place], [place] [phone] 3 5 5 5           [facility] [address] [place], [place] [phone] 5 4 5 5   [facility])  [address], [place] [phone] 1 1 2 1   [facility]) [address] [place] [phone]  2 4 4   [facility] [address], [place] [phone] 3 5 5 5   [address] [place], [place] [phone] 4 2 2 2   [facility]) [address] [place] [phone] 1 1 3 1   [facility] [address] [place] [phone] 2 2 2 2       Expected Discharge Plan: Skilled Nursing Facility Barriers to Discharge: English As A Second Language Teacher, Continued Medical Work up, SNF Pending bed offer               Expected Discharge Plan and Services In-house Referral: Clinical Social Work   Post Acute Care Choice: Skilled Nursing Facility Living arrangements for the past 2 months: Single Family Home                                       Social Drivers of Health (SDOH) Interventions SDOH Screenings   Food Insecurity: No Food Insecurity (10/26/2024)  Housing: Low Risk (10/26/2024)  Transportation Needs: No Transportation Needs (10/26/2024)  Utilities: Not At Risk (10/26/2024)  Depression (PHQ2-9): Low Risk (06/21/2022)  Social Connections: Moderately Integrated (10/26/2024)  Tobacco Use: Medium Risk (10/26/2024)    Readmission Risk Interventions     No data to display

## 2024-10-31 NOTE — Plan of Care (Signed)
 Patient is progressing towards goals of care.     Problem: Education: Goal: Knowledge of General Education information will improve Description: Including pain rating scale, medication(s)/side effects and non-pharmacologic comfort measures Outcome: Progressing   Problem: Health Behavior/Discharge Planning: Goal: Ability to manage health-related needs will improve Outcome: Progressing   Problem: Clinical Measurements: Goal: Ability to maintain clinical measurements within normal limits will improve Outcome: Progressing Goal: Will remain free from infection Outcome: Progressing Goal: Diagnostic test results will improve Outcome: Progressing Goal: Respiratory complications will improve Outcome: Progressing Goal: Cardiovascular complication will be avoided Outcome: Progressing   Problem: Activity: Goal: Risk for activity intolerance will decrease Outcome: Progressing   Problem: Nutrition: Goal: Adequate nutrition will be maintained Outcome: Progressing   Problem: Coping: Goal: Level of anxiety will decrease Outcome: Progressing   Problem: Elimination: Goal: Will not experience complications related to bowel motility Outcome: Progressing Goal: Will not experience complications related to urinary retention Outcome: Progressing   Problem: Pain Managment: Goal: General experience of comfort will improve and/or be controlled Outcome: Progressing   Problem: Safety: Goal: Ability to remain free from injury will improve Outcome: Progressing   Problem: Skin Integrity: Goal: Risk for impaired skin integrity will decrease Outcome: Progressing   Problem: Fluid Volume: Goal: Hemodynamic stability will improve Outcome: Progressing   Problem: Clinical Measurements: Goal: Diagnostic test results will improve Outcome: Progressing Goal: Signs and symptoms of infection will decrease Outcome: Progressing   Problem: Respiratory: Goal: Ability to maintain adequate ventilation  will improve Outcome: Progressing   Problem: Activity: Goal: Ability to tolerate increased activity will improve Outcome: Progressing   Problem: Clinical Measurements: Goal: Ability to maintain a body temperature in the normal range will improve Outcome: Progressing   Problem: Respiratory: Goal: Ability to maintain adequate ventilation will improve Outcome: Progressing Goal: Ability to maintain a clear airway will improve Outcome: Progressing   Problem: Safety: Goal: Non-violent Restraint(s) Outcome: Progressing

## 2024-10-31 NOTE — NC FL2 (Signed)
 " [place]  MEDICAID FL2 LEVEL OF CARE FORM     IDENTIFICATION  Patient Name: [name] Birthdate: May 20, 1946 Sex: female Admission Date (Current Location): 10/26/2024  [facility] and [place] Number:  Producer, Television/film/video and Address:  [facility] [place], [address], [place], [place] [address]      Provider Number: [id number]  Attending Physician Name and Address:  Raenelle Donalda HERO, MD  Relative Name and Phone Number:       Current Level of Care: Hospital Recommended Level of Care: Skilled Nursing Facility Prior Approval Number:    Date Approved/Denied:   PASRR Number: [id number]  Discharge Plan: SNF    Current Diagnoses: Patient Active Problem List   Diagnosis Date Noted   Sepsis due to pneumonia (HCC) 10/26/2024   E coli bacteremia    Acute lower UTI    Sepsis due to undetermined organism (HCC) 09/19/2021   Hypokalemia 09/19/2021   Leukopenia 09/19/2021   Prolonged QT interval 09/19/2021   Arthritis of right knee    UTI (urinary tract infection) 06/26/2021   Severe sepsis (HCC) 06/25/2021   Acute metabolic encephalopathy 06/25/2021   Hypothyroid 06/25/2021   S/P total knee arthroplasty, right 06/09/2021   Fever    Staphylococcus aureus bacteremia with sepsis (HCC)    SIRS (systemic inflammatory response syndrome) (HCC) 11/30/2020   Lumbar radiculopathy 10/10/2019   Bilateral stenosis of lateral recess of lumbar spine 10/10/2019   Hemorrhoids, external without complications 06/17/2014   Depression    Anxiety    Rectal bleeding    History of radiation therapy    Malignant neoplasm of anal canal (HCC) 11/15/2011   Hemorrhoids, internal, with bleeding 09/07/2011   Acquired anal stenosis 09/07/2011   Hyperlipidemia 09/07/2011   Family history of breast cancer in sister 09/07/2011    Orientation RESPIRATION BLADDER Height & Weight     Self, Time, Situation, Place  O2 (3L nasal cannula) Incontinent, External catheter Weight: 170  lb (77.1 kg) Height:  5' 5 (165.1 cm)  BEHAVIORAL SYMPTOMS/MOOD NEUROLOGICAL BOWEL NUTRITION STATUS      Continent Diet (See dc summary)  AMBULATORY STATUS COMMUNICATION OF NEEDS Skin   Limited Assist Verbally Normal                       Personal Care Assistance Level of Assistance  Bathing, Feeding, Dressing Bathing Assistance: Limited assistance Feeding assistance: Limited assistance Dressing Assistance: Limited assistance     Functional Limitations Info  Sight, Hearing Sight Info: Impaired Hearing Info: Impaired      SPECIAL CARE FACTORS FREQUENCY  PT (By licensed PT), OT (By licensed OT)     PT Frequency: 5x/week OT Frequency: 5x/week            Contractures Contractures Info: Not present    Additional Factors Info  Code Status, Allergies, Psychotropic Code Status Info: Full Allergies Info: NKA Psychotropic Info: See dc summary         Current Medications (10/31/2024):  This is the current hospital active medication list Current Facility-Administered Medications  Medication Dose Route Frequency Provider Last Rate Last Admin   0.45 % sodium chloride  infusion   Intravenous Continuous Raenelle Donalda HERO, MD 50 mL/hr at 10/31/24 0854 New Bag at 10/31/24 0854   acetaminophen  (TYLENOL ) tablet 650 mg  650 mg Oral Q6H PRN Garba, Mohammad L, MD   650 mg at 10/31/24 9161   Or   acetaminophen  (TYLENOL ) suppository 650 mg  650 mg  Rectal Q6H PRN Sim Emery CROME, MD       ARIPiprazole  (ABILIFY ) tablet 10 mg  10 mg Oral Daily Elgergawy, Dawood S, MD   10 mg at 10/31/24 9160   cholecalciferol  (VITAMIN D3) 25 MCG (1000 UNIT) tablet 1,000 Units  1,000 Units Oral Daily Elgergawy, Dawood S, MD   1,000 Units at 10/31/24 9161   cyanocobalamin  (VITAMIN B12) tablet 1,000 mcg  1,000 mcg Oral Daily Elgergawy, Dawood S, MD   1,000 mcg at 10/31/24 9160   donepezil  (ARICEPT ) tablet 5 mg  5 mg Oral QHS Elgergawy, Dawood S, MD   5 mg at 10/30/24 2200   doxycycline  (VIBRA -TABS)  tablet 100 mg  100 mg Oral Q12H Elgergawy, Dawood S, MD   100 mg at 10/31/24 9160   DULoxetine  (CYMBALTA ) DR capsule 120 mg  120 mg Oral Daily Elgergawy, Dawood S, MD   120 mg at 10/31/24 9161   enoxaparin  (LOVENOX ) injection 40 mg  40 mg Subcutaneous Q24H Sim Emery L, MD   40 mg at 10/30/24 2200   gabapentin  (NEURONTIN ) capsule 300 mg  300 mg Oral BID Elgergawy, Dawood S, MD   300 mg at 10/31/24 9161   Gerhardt's butt cream   Topical BID Elgergawy, Dawood S, MD   Given at 10/31/24 9160   haloperidol  lactate (HALDOL ) injection 2 mg  2 mg Intravenous Q6H PRN Elgergawy, Dawood S, MD   2 mg at 10/29/24 0749   hydrALAZINE  (APRESOLINE ) injection 5 mg  5 mg Intravenous Q6H PRN Elgergawy, Dawood S, MD       ipratropium-albuterol  (DUONEB) 0.5-2.5 (3) MG/3ML nebulizer solution 3 mL  3 mL Nebulization Q4H PRN Cleatus Delayne GAILS, MD   3 mL at 10/29/24 0003   irbesartan  (AVAPRO ) tablet 150 mg  150 mg Oral Daily Elgergawy, Dawood S, MD   150 mg at 10/31/24 9161   levothyroxine  (SYNTHROID ) tablet 25 mcg  25 mcg Oral QAC breakfast Elgergawy, Dawood S, MD   25 mcg at 10/31/24 0548   LORazepam  (ATIVAN ) injection 1 mg  1 mg Intravenous Q4H PRN Elgergawy, Dawood S, MD   1 mg at 10/28/24 1543   promethazine  (PHENERGAN ) tablet 12.5 mg  12.5 mg Oral Q6H PRN Sim Emery CROME, MD       QUEtiapine  (SEROQUEL ) tablet 100 mg  100 mg Oral QHS Elgergawy, Dawood S, MD   100 mg at 10/30/24 2200     Discharge Medications: Please see discharge summary for a list of discharge medications.  Relevant Imaging Results:  Relevant Lab Results:   Additional Information SSn: [id number]  Stella Bortle S Jahmia Berrett, LCSW     "

## 2024-11-01 DIAGNOSIS — F419 Anxiety disorder, unspecified: Secondary | ICD-10-CM | POA: Diagnosis not present

## 2024-11-01 DIAGNOSIS — E785 Hyperlipidemia, unspecified: Secondary | ICD-10-CM | POA: Diagnosis not present

## 2024-11-01 DIAGNOSIS — F32A Depression, unspecified: Secondary | ICD-10-CM | POA: Diagnosis not present

## 2024-11-01 DIAGNOSIS — J189 Pneumonia, unspecified organism: Secondary | ICD-10-CM | POA: Diagnosis not present

## 2024-11-01 LAB — BASIC METABOLIC PANEL WITH GFR
Anion gap: 9 (ref 5–15)
BUN: 19 mg/dL (ref 8–23)
CO2: 26 mmol/L (ref 22–32)
Calcium: 8.4 mg/dL — ABNORMAL LOW (ref 8.9–10.3)
Chloride: 107 mmol/L (ref 98–111)
Creatinine, Ser: 0.46 mg/dL (ref 0.44–1.00)
GFR, Estimated: >60 mL/min
Glucose, Bld: 86 mg/dL (ref 70–99)
Potassium: 3.7 mmol/L (ref 3.5–5.1)
Sodium: 142 mmol/L (ref 135–145)

## 2024-11-01 LAB — MAGNESIUM: Magnesium: 2.1 mg/dL (ref 1.7–2.4)

## 2024-11-01 LAB — PHOSPHORUS: Phosphorus: 3.5 mg/dL (ref 2.5–4.6)

## 2024-11-01 MED ORDER — QUETIAPINE FUMARATE 200 MG PO TABS: 100.0000 mg | ORAL_TABLET | Freq: Every day | ORAL | Status: AC

## 2024-11-01 MED ORDER — GABAPENTIN 600 MG PO TABS: 300.0000 mg | ORAL_TABLET | Freq: Two times a day (BID) | ORAL | Status: AC

## 2024-11-01 MED ORDER — FUROSEMIDE 40 MG PO TABS: 40.0000 mg | ORAL_TABLET | Freq: Every day | ORAL | Status: AC | PRN

## 2024-11-01 MED ORDER — GERHARDT'S BUTT CREAM: 1.0000 | TOPICAL_CREAM | Freq: Two times a day (BID) | CUTANEOUS | Status: AC

## 2024-11-01 NOTE — TOC Transition Note (Signed)
 Transition of Care [facility]) - Discharge Note   Patient Details  Name: [name]: [id number] Date of Birth: 23-Jun-1946  [facility]) CM/SW Contact:  Almarie CHRISTELLA Goodie, LCSW Phone Number: 11/01/2024, 10:54 AM   Clinical Narrative:   CSW updated by MD that patient stable for discharge, and confirmed with [facility] that a bed is available for patient today. CSW sent discharge information, confirmed with [facility] that room is ready for the patient. CSW attempted to reach daughter to update, left a voicemail for April. Transport arranged with [facility] for next available.  Nurse to call report to [phone].    Final next level of care: Skilled Nursing Facility Barriers to Discharge: Barriers Resolved   Patient Goals and CMS Choice Patient states their goals for this hospitalization and ongoing recovery are:: Rehab CMS Medicare.gov Compare Post Acute Care list provided to:: Patient Choice offered to / list presented to : Patient [place] ownership interest in [facility].provided to:: Patient    Discharge Placement              Patient [facility] bed at: [facility] Patient to be transferred to facility by: [facility] Name of family member notified: April, left a voicemail Patient and family notified of of transfer: 11/01/24  Discharge Plan and Services Additional resources added to the After Visit Summary for   In-house Referral: Clinical Social Work   Post Acute Care Choice: Skilled Nursing Facility                               Social Drivers of Health (SDOH) Interventions SDOH Screenings   Food Insecurity: No Food Insecurity (10/26/2024)  Housing: Low Risk (10/26/2024)  Transportation Needs: No Transportation Needs (10/26/2024)  Utilities: Not At Risk (10/26/2024)  Depression (PHQ2-9): Low Risk (06/21/2022)  Social Connections: Moderately Integrated (10/26/2024)  Tobacco Use: Medium Risk (10/26/2024)      Readmission Risk Interventions     No data to display

## 2024-11-01 NOTE — Plan of Care (Signed)
  Problem: Health Behavior/Discharge Planning: Goal: Ability to manage health-related needs will improve Outcome: Progressing   Problem: Clinical Measurements: Goal: Will remain free from infection Outcome: Progressing Goal: Respiratory complications will improve Outcome: Progressing   Problem: Activity: Goal: Risk for activity intolerance will decrease Outcome: Progressing   Problem: Safety: Goal: Ability to remain free from injury will improve Outcome: Progressing   

## 2024-11-01 NOTE — Discharge Summary (Addendum)
 "  PATIENT DETAILS Name: [name] Age: 79 y.o. Sex: female Date of Birth: 11/27/1945 MRN: [id number]. Admitting Physician: Emery LITTIE Fuss, MD ERE:Typuz, Montie, MD  Admit Date: 10/26/2024 Discharge date: 11/01/2024  Recommendations for Outpatient Follow-up:  Follow up with PCP in 1-2 weeks Please obtain CMP/CBC in one week  Admitted From:  Home  Disposition: Skilled nursing facility   Discharge Condition: fair  CODE STATUS:   Code Status: Full Code   Diet recommendation:  Diet Order             Diet - low sodium heart healthy           Diet Heart Room service appropriate? Yes; Fluid consistency: Thin  Diet effective now                    Brief Summary: Patient is a 79 y.o.  female with a history of HTN, anxiety disorder/depression, squamous cell carcinoma of the anus-who presented to the ED with weakness, nausea-diarrhea-fever-upon further evaluation-she was found to have severe sepsis secondary to PNA-Hospital course complicated by acute metabolic encephalopathy.   Significant events: 12/26>> admit to [facility]   Significant studies: 12/26>> CT head: No acute intracranial abnormality 12/26>> CT chest/abdomen/pelvis: Suspicious for bronchopneumonia involving left lower lobe.   Significant microbiology data: 12/26>> COVID/influenza/RSV PCR: Negative 12/26>> blood culture: No growth 12/27>> respiratory virus panel: Negative 12/27>> C. difficile PCR: Negative 12/27>> stool GI pathogen panel: Negative   Procedures: None   Consults: None  Brief Hospital Course: Severe sepsis secondary to PNA Sepsis physiology has resolved Culture data as above Completed a course of Rocephin /doxycycline  on 12/31.   Acute metabolic encephalopathy Secondary to sepsis/PNA-superimposed on dementia Much improved after treatment of underlying etiologies No major issues overnight-relatively awake and alert-has been tolerating current doses (reduced doses than her usual  baseline) of Seroquel /Abilify /Cymbalta /Neurontin  without any major issues.  Initially all of these medications were held. Continue to maintain delirium precautions.     Diarrhea ?  Antibiotic related Seems to have improved with supportive care. Stool studies workup negative Imodium  as needed   Hypokalemia/hypophosphatemia Repleted-recheck periodically   Hypernatremia Probably secondary to diarrhea Resolved with resolution of diarrhea and gentle IV fluid hydration.   HTN BP stable Avapro    Hypothyroidism Synthroid    Depression/anxiety/dementia See above documentation-tolerating initiation of Seroquel /Cymbalta /Neurontin /Abilify  (at a reduced dosage than baseline)-gradually uptitrate to her usual dosage   Prior history of recurrent bacteremia (E. coli/Staph aureus in 2022) Blood cultures negative so far   Debility/deconditioning Worse due to acute illness SNF planned on discharge   Remote history of anal cancer Stable for outpatient monitoring/surveillance  Note-called daughter April at [phone]-left voicemail  Discharge Diagnoses:  Principal Problem:   Sepsis due to pneumonia [facility]) Active Problems:   Hyperlipidemia   Malignant neoplasm of anal canal (HCC)   Depression   Anxiety   Hypothyroid   Discharge Instructions:  Activity:  As tolerated with Full fall precautions use walker/cane & assistance as needed  Discharge Instructions     Call MD for:  difficulty breathing, headache or visual disturbances   Complete by: As directed    Call MD for:  extreme fatigue   Complete by: As directed    Diet - low sodium heart healthy   Complete by: As directed    Discharge instructions   Complete by: As directed    Follow with Primary MD  Teresa Montie, MD in 1-2 weeks  Please get a complete blood count and chemistry panel  checked by your Primary MD at your next visit, and again as instructed by your Primary MD.  Get Medicines reviewed and adjusted: Please  take all your medications with you for your next visit with your Primary MD  Laboratory/radiological data: Please request your Primary MD to go over all hospital tests and procedure/radiological results at the follow up, please ask your Primary MD to get all Hospital records sent to his/her office.  In some cases, they will be blood work, cultures and biopsy results pending at the time of your discharge. Please request that your primary care M.D. follows up on these results.  Also Note the following: If you experience worsening of your admission symptoms, develop shortness of breath, life threatening emergency, suicidal or homicidal thoughts you must seek medical attention immediately by calling 911 or calling your MD immediately  if symptoms less severe.  You must read complete instructions/literature along with all the possible adverse reactions/side effects for all the Medicines you take and that have been prescribed to you. Take any new Medicines after you have completely understood and accpet all the possible adverse reactions/side effects.   Do not drive when taking Pain medications or sleeping medications (Benzodaizepines)  Do not take more than prescribed Pain, Sleep and Anxiety Medications. It is not advisable to combine anxiety,sleep and pain medications without talking with your primary care practitioner  Special Instructions: If you have smoked or chewed Tobacco  in the last 2 yrs please stop smoking, stop any regular Alcohol  and or any Recreational drug use.  Wear Seat belts while driving.  Please note: You were cared for by a hospitalist during your hospital stay. Once you are discharged, your primary care physician will handle any further medical issues. Please note that NO REFILLS for any discharge medications will be authorized once you are discharged, as it is imperative that you return to your primary care physician (or establish a relationship with a primary care physician  if you do not have one) for your post hospital discharge needs so that they can reassess your need for medications and monitor your lab values.   Increase activity slowly   Complete by: As directed       Allergies as of 11/01/2024   No Known Allergies      Medication List     STOP taking these medications    potassium chloride  10 MEQ tablet Commonly known as: KLOR-CON        TAKE these medications    acetaminophen  500 MG tablet Commonly known as: TYLENOL  Take 1,000 mg by mouth in the morning and at bedtime.   alendronate 10 MG tablet Commonly known as: FOSAMAX Take 10 mg by mouth daily.   ARIPiprazole  10 MG tablet Commonly known as: ABILIFY  Take 10 mg by mouth daily.   BIOTIN PO Take 1 tablet by mouth daily.   donepezil  10 MG tablet Commonly known as: ARICEPT  Take 10 mg by mouth at bedtime.   DULoxetine  60 MG capsule Commonly known as: CYMBALTA  Take 120 mg by mouth daily.   FISH OIL PO Take 1 capsule by mouth daily in the afternoon.   furosemide  40 MG tablet Commonly known as: LASIX  Take 1 tablet (40 mg total) by mouth daily as needed for fluid or edema. What changed:  when to take this reasons to take this   gabapentin  600 MG tablet Commonly known as: NEURONTIN  Take 0.5 tablets (300 mg total) by mouth 2 (two) times daily. What changed: how much to take  Gerhardt's butt cream Crea Apply 1 Application topically 2 (two) times daily for 8 days.   hydrOXYzine 25 MG tablet Commonly known as: ATARAX Take 25-50 mg by mouth every 6 (six) hours as needed for anxiety.   levothyroxine  25 MCG tablet Commonly known as: SYNTHROID  Take 25 mcg by mouth daily before breakfast.   Multivitamin Women 50+ Tabs Take 1 tablet by mouth daily.   pravastatin  40 MG tablet Commonly known as: PRAVACHOL  Take 40 mg by mouth daily in the afternoon.   QUEtiapine  200 MG tablet Commonly known as: SEROQUEL  Take 0.5 tablets (100 mg total) by mouth at bedtime. What changed:  how much to take   valsartan 320 MG tablet Commonly known as: DIOVAN Take 320 mg by mouth daily in the afternoon.   VITAMIN B-12 PO Take 1 tablet by mouth daily.   VITAMIN D -3 PO Take 1 capsule by mouth at bedtime.        Contact information for follow-up providers     Teresa Channel, MD. Schedule an appointment as soon as possible for a visit in 1 week(s).   Specialty: Family Medicine Contact information: [phone] [address] [place] [address] [phone]              Contact information for after-discharge care     Destination     [facility] .   Service: Skilled Nursing Contact information: [address] [place]  [address] [phone]                    Allergies[1]   Other Procedures/Studies: CT CHEST ABDOMEN PELVIS W CONTRAST Result Date: 10/26/2024 EXAM: CT CHEST, ABDOMEN AND PELVIS WITH CONTRAST 10/26/2024 04:07:59 PM TECHNIQUE: CT of the chest, abdomen and pelvis was performed with the administration of 75 mL of iohexol  (OMNIPAQUE ) 350 MG/ML injection. Multiplanar reformatted images are provided for review. Automated exposure control, iterative reconstruction, and/or weight based adjustment of the mA/kV was utilized to reduce the radiation dose to as low as reasonably achievable. COMPARISON: 06/15/2023 CLINICAL HISTORY: Sepsis FINDINGS: CHEST: MEDIASTINUM AND LYMPH NODES: Heart and pericardium are unremarkable. The central airways are clear. No mediastinal, hilar or axillary lymphadenopathy. LUNGS AND PLEURA: Posterior dependent atelectasis within both upper and lower lobes. Subsegmental atelectasis in the right middle lobe and lingula. Patchy nodular airspace opacities in the anterobasal left lower lobe. Calcified granuloma in the left lung apex, unchanged. No pleural effusion or pneumothorax. ABDOMEN AND PELVIS: LIVER: Mild diffuse hepatic steatosis. GALLBLADDER AND BILE DUCTS: Distended gallbladder without  radiopaque stones or wall thickening. No biliary ductal dilatation. SPLEEN: Small cyst in the inferior splenic hilum, unchanged. PANCREAS: No acute abnormality. ADRENAL GLANDS: No acute abnormality. KIDNEYS, URETERS AND BLADDER: No stones in the kidneys or ureters. No hydronephrosis. No perinephric or periureteral stranding. Urinary bladder is unremarkable. GI AND BOWEL: Small sliding type hiatal hernia. Ingested material in the mid and distal esophagus. The appendix was not visualized. No pericecal or right lower quadrant inflammatory stranding to suggest acute appendicitis. There is no bowel obstruction. REPRODUCTIVE ORGANS: No acute abnormality. PERITONEUM AND RETROPERITONEUM: No free pelvic fluid. No free air. VASCULATURE: Calcified atherosclerosis throughout the aorta. Aorta is normal in caliber. ABDOMINAL AND PELVIS LYMPH NODES: No lymphadenopathy. BONES AND SOFT TISSUES: Moderate glenohumeral joint osteoarthritis bilaterally. Unchanged chronic compression fractures of T6, T10, L1, and L3. Diffuse osteopenia. Multilevel degenerative disc disease of the spine. No focal soft tissue abnormality. IMPRESSION: 1. Patchy nodular airspace opacities in the anterobasal left lower lobe,  suspicious for bronchopneumonia. 2. No other acute findings in the chest, abdomen, and pelvis. 3. Hepatic steatosis. Electronically signed by: Rogelia Myers MD 10/26/2024 04:48 PM EST RP Workstation: [id number]   CT Head Wo Contrast Result Date: 10/26/2024 EXAM: CT HEAD WITHOUT CONTRAST 10/26/2024 04:07:59 PM TECHNIQUE: CT of the head was performed without the administration of intravenous contrast. Automated exposure control, iterative reconstruction, and/or weight based adjustment of the mA/kV was utilized to reduce the radiation dose to as low as reasonably achievable. COMPARISON: 06/15/2023 CLINICAL HISTORY: Mental status change, unknown cause. FINDINGS: BRAIN AND VENTRICLES: No acute hemorrhage. No evidence of acute infarct. No  hydrocephalus. No extra-axial collection. No mass effect or midline shift. Atherosclerotic calcifications within the cavernous internal carotid arteries. ORBITS: No acute abnormality. SINUSES: No acute abnormality. SOFT TISSUES AND SKULL: No acute soft tissue abnormality. No skull fracture. IMPRESSION: 1. No acute intracranial abnormality. Electronically signed by: Donnice Mania MD 10/26/2024 04:14 PM EST RP Workstation: [id number]   DG Chest Port 1 View Result Date: 10/26/2024 EXAM: 1 VIEW(S) XRAY OF THE CHEST 10/26/2024 12:55:00 AM COMPARISON: 06/15/2023 CLINICAL HISTORY: Questionable sepsis - evaluate for abnormality FINDINGS: LUNGS AND PLEURA: Elevated right hemidiaphragm. Low lung volumes. Bibasilar atelectasis. No pleural effusion. No pneumothorax. HEART AND MEDIASTINUM: Aortic atherosclerotic calcification. No acute abnormality of the cardiac and mediastinal silhouettes. BONES AND SOFT TISSUES: No acute osseous abnormality. IMPRESSION: 1. No acute findings. 2. Elevated right hemidiaphragm, low lung volumes, and bibasilar atelectasis. Electronically signed by: Katheleen Faes MD 10/26/2024 01:10 PM EST RP Workstation: [id number]     TODAY-DAY OF DISCHARGE:  Subjective:   [name] today has no headache,no chest abdominal pain,no new weakness tingling or numbness, feels much better wants to go home today.   Objective:   Blood pressure (!) 145/69, pulse 72, temperature 98 F (36.7 C), temperature source Oral, resp. rate 11, height 5' 5 (1.651 m), weight 77.1 kg, SpO2 99%.  Intake/Output Summary (Last 24 hours) at 11/01/2024 0900 Last data filed at 11/01/2024 0654 Gross per 24 hour  Intake 540 ml  Output 300 ml  Net 240 ml   Filed Weights   10/26/24 1140  Weight: 77.1 kg    Exam: Awake Alert, Oriented *3, No new F.N deficits, Normal affect [place].AT,PERRAL Supple Neck,No JVD, No cervical lymphadenopathy appriciated.  Symmetrical Chest wall movement, Good air movement bilaterally,  CTAB RRR,No Gallops,Rubs or new Murmurs, No Parasternal Heave +ve B.Sounds, Abd Soft, Non tender, No organomegaly appriciated, No rebound -guarding or rigidity. No Cyanosis, Clubbing or edema, No new Rash or bruise   PERTINENT RADIOLOGIC STUDIES: No results found.   PERTINENT LAB RESULTS: CBC: Recent Labs    10/30/24 0355 10/31/24 0409  WBC 7.1 6.5  HGB 11.3* 11.7*  HCT 35.0* 36.2  PLT 183 173   CMET CMP     Component Value Date/Time   NA 142 11/01/2024 0400   K 3.7 11/01/2024 0400   CL 107 11/01/2024 0400   CO2 26 11/01/2024 0400   GLUCOSE 86 11/01/2024 0400   BUN 19 11/01/2024 0400   CREATININE 0.46 11/01/2024 0400   CALCIUM 8.4 (L) 11/01/2024 0400   PROT 5.6 (L) 10/27/2024 0339   ALBUMIN  3.4 (L) 10/27/2024 0339   AST 23 10/27/2024 0339   ALT 15 10/27/2024 0339   ALKPHOS 56 10/27/2024 0339   BILITOT 0.3 10/27/2024 0339   GFRNONAA >60 11/01/2024 0400    GFR Estimated Creatinine Clearance: 60.4 mL/min (by C-G formula based on SCr of 0.46 mg/dL). No results for  input(s): LIPASE, AMYLASE in the last 72 hours. No results for input(s): CKTOTAL, CKMB, CKMBINDEX, TROPONINI in the last 72 hours. Invalid input(s): POCBNP No results for input(s): DDIMER in the last 72 hours. No results for input(s): HGBA1C in the last 72 hours. No results for input(s): CHOL, HDL, LDLCALC, TRIG, CHOLHDL, LDLDIRECT in the last 72 hours. No results for input(s): TSH, T4TOTAL, T3FREE, THYROIDAB in the last 72 hours.  Invalid input(s): FREET3 No results for input(s): VITAMINB12, FOLATE, FERRITIN, TIBC, IRON, RETICCTPCT in the last 72 hours. Coags: No results for input(s): INR in the last 72 hours.  Invalid input(s): PT Microbiology: Recent Results (from the past 240 hours)  Resp panel by RT-PCR (RSV, Flu A&B, Covid) Anterior Nasal Swab     Status: None   Collection Time: 10/26/24 12:08 PM   Specimen: Anterior Nasal Swab  Result  Value Ref Range Status   SARS Coronavirus 2 by RT PCR NEGATIVE NEGATIVE Final   Influenza A by PCR NEGATIVE NEGATIVE Final   Influenza B by PCR NEGATIVE NEGATIVE Final    Comment: (NOTE) The Xpert Xpress SARS-CoV-2/FLU/RSV plus assay is intended as an aid in the diagnosis of influenza from Nasopharyngeal swab specimens and should not be used as a sole basis for treatment. Nasal washings and aspirates are unacceptable for Xpert Xpress SARS-CoV-2/FLU/RSV testing.  Fact Sheet for Patients: bloggercourse.com  Fact Sheet for Healthcare Providers: seriousbroker.it  This test is not yet approved or cleared by the [place]  FDA and has been authorized for detection and/or diagnosis of SARS-CoV-2 by FDA under an Emergency Use Authorization (EUA). This EUA will remain in effect (meaning this test can be used) for the duration of the COVID-19 declaration under Section 564(b)(1) of the Act, 21 U.S.C. section 360bbb-3(b)(1), unless the authorization is terminated or revoked.     Resp Syncytial Virus by PCR NEGATIVE NEGATIVE Final    Comment: (NOTE) Fact Sheet for Patients: bloggercourse.com  Fact Sheet for Healthcare Providers: seriousbroker.it  This test is not yet approved or cleared by the [place]  FDA and has been authorized for detection and/or diagnosis of SARS-CoV-2 by FDA under an Emergency Use Authorization (EUA). This EUA will remain in effect (meaning this test can be used) for the duration of the COVID-19 declaration under Section 564(b)(1) of the Act, 21 U.S.C. section 360bbb-3(b)(1), unless the authorization is terminated or revoked.  Performed at [facility], [address] [place], [place] [address]   Blood Culture (routine x 2)     Status: None   Collection Time: 10/26/24 12:12 PM   Specimen: BLOOD LEFT WRIST  Result Value Ref Range Status   Specimen  Description BLOOD LEFT WRIST  Final   Special Requests   Final    BOTTLES DRAWN AEROBIC AND ANAEROBIC Blood Culture results may not be optimal due to an inadequate volume of blood received in culture bottles   Culture   Final    NO GROWTH 5 DAYS Performed at [facility], [address], [place], [place] [address]    Report Status 10/31/2024 FINAL  Final  Blood Culture (routine x 2)     Status: None   Collection Time: 10/26/24  6:58 PM   Specimen: BLOOD LEFT WRIST  Result Value Ref Range Status   Specimen Description BLOOD LEFT WRIST  Final   Special Requests   Final    BOTTLES DRAWN AEROBIC AND ANAEROBIC Blood Culture results may not be optimal due to an inadequate volume of blood  received in culture bottles   Culture   Final    NO GROWTH 5 DAYS Performed at [facility], [address], [place], [place] [address]    Report Status 10/31/2024 FINAL  Final  Gastrointestinal Panel by PCR , Stool     Status: None   Collection Time: 10/27/24 12:08 AM   Specimen: Stool  Result Value Ref Range Status   Campylobacter species NOT DETECTED NOT DETECTED Final   Plesimonas shigelloides NOT DETECTED NOT DETECTED Final   Salmonella species NOT DETECTED NOT DETECTED Final   Yersinia enterocolitica NOT DETECTED NOT DETECTED Final   Vibrio species NOT DETECTED NOT DETECTED Final   Vibrio cholerae NOT DETECTED NOT DETECTED Final   Enteroaggregative E coli (EAEC) NOT DETECTED NOT DETECTED Final   Enteropathogenic E coli (EPEC) NOT DETECTED NOT DETECTED Final   Enterotoxigenic E coli (ETEC) NOT DETECTED NOT DETECTED Final   Shiga like toxin producing E coli (STEC) NOT DETECTED NOT DETECTED Final   Shigella/Enteroinvasive E coli (EIEC) NOT DETECTED NOT DETECTED Final   Cryptosporidium NOT DETECTED NOT DETECTED Final   Cyclospora cayetanensis NOT DETECTED NOT DETECTED Final   Entamoeba histolytica NOT DETECTED NOT DETECTED Final   Giardia lamblia NOT DETECTED NOT DETECTED Final    Adenovirus F40/41 NOT DETECTED NOT DETECTED Final   Astrovirus NOT DETECTED NOT DETECTED Final   Norovirus GI/GII NOT DETECTED NOT DETECTED Final   Rotavirus A NOT DETECTED NOT DETECTED Final   Sapovirus (I, II, IV, and V) NOT DETECTED NOT DETECTED Final    Comment: Performed at [facility], [address] [place], [place] [address]  Respiratory (~20 pathogens) panel by PCR     Status: None   Collection Time: 10/27/24  8:03 AM   Specimen: Nasopharyngeal Swab; Respiratory  Result Value Ref Range Status   Adenovirus NOT DETECTED NOT DETECTED Final   Coronavirus 229E NOT DETECTED NOT DETECTED Final    Comment: (NOTE) The Coronavirus on the Respiratory Panel, DOES NOT test for the novel  Coronavirus (2019 nCoV)    Coronavirus HKU1 NOT DETECTED NOT DETECTED Final   Coronavirus NL63 NOT DETECTED NOT DETECTED Final   Coronavirus OC43 NOT DETECTED NOT DETECTED Final   Metapneumovirus NOT DETECTED NOT DETECTED Final   Rhinovirus / Enterovirus NOT DETECTED NOT DETECTED Final   Influenza A NOT DETECTED NOT DETECTED Final   Influenza B NOT DETECTED NOT DETECTED Final   Parainfluenza Virus 1 NOT DETECTED NOT DETECTED Final   Parainfluenza Virus 2 NOT DETECTED NOT DETECTED Final   Parainfluenza Virus 3 NOT DETECTED NOT DETECTED Final   Parainfluenza Virus 4 NOT DETECTED NOT DETECTED Final   Respiratory Syncytial Virus NOT DETECTED NOT DETECTED Final   Bordetella pertussis NOT DETECTED NOT DETECTED Final   Bordetella Parapertussis NOT DETECTED NOT DETECTED Final   Chlamydophila pneumoniae NOT DETECTED NOT DETECTED Final   Mycoplasma pneumoniae NOT DETECTED NOT DETECTED Final    Comment: Performed at [facility], [address], [place], [place] [address]  C Difficile Quick Screen w PCR reflex     Status: None   Collection Time: 10/27/24  3:28 PM   Specimen: STOOL  Result Value Ref Range Status   C Diff antigen NEGATIVE NEGATIVE Final   C Diff toxin NEGATIVE NEGATIVE Final    C Diff interpretation No C. difficile detected.  Final    Comment: Performed at [facility], [address] [place], [place] [address]  C Difficile Quick  Screen w PCR reflex     Status: None   Collection Time: 10/27/24  9:18 PM   Specimen: STOOL  Result Value Ref Range Status   C Diff antigen NEGATIVE NEGATIVE Final   C Diff toxin NEGATIVE NEGATIVE Final   C Diff interpretation No C. difficile detected.  Final    Comment: Performed at [facility], [address], [place], [place] [address]    FURTHER DISCHARGE INSTRUCTIONS:  Get Medicines reviewed and adjusted: Please take all your medications with you for your next visit with your Primary MD  Laboratory/radiological data: Please request your Primary MD to go over all hospital tests and procedure/radiological results at the follow up, please ask your Primary MD to get all Hospital records sent to his/her office.  In some cases, they will be blood work, cultures and biopsy results pending at the time of your discharge. Please request that your primary care M.D. goes through all the records of your hospital data and follows up on these results.  Also Note the following: If you experience worsening of your admission symptoms, develop shortness of breath, life threatening emergency, suicidal or homicidal thoughts you must seek medical attention immediately by calling 911 or calling your MD immediately  if symptoms less severe.  You must read complete instructions/literature along with all the possible adverse reactions/side effects for all the Medicines you take and that have been prescribed to you. Take any new Medicines after you have completely understood and accpet all the possible adverse reactions/side effects.   Do not drive when taking Pain medications or sleeping medications (Benzodaizepines)  Do not take more than prescribed Pain, Sleep and Anxiety Medications. It is not advisable to combine anxiety,sleep and pain  medications without talking with your primary care practitioner  Special Instructions: If you have smoked or chewed Tobacco  in the last 2 yrs please stop smoking, stop any regular Alcohol  and or any Recreational drug use.  Wear Seat belts while driving.  Please note: You were cared for by a hospitalist during your hospital stay. Once you are discharged, your primary care physician will handle any further medical issues. Please note that NO REFILLS for any discharge medications will be authorized once you are discharged, as it is imperative that you return to your primary care physician (or establish a relationship with a primary care physician if you do not have one) for your post hospital discharge needs so that they can reassess your need for medications and monitor your lab values.  Total Time spent coordinating discharge including counseling, education and face to face time equals greater than 30 minutes.  Signed: Talaysha Freeberg 11/01/2024 9:00 AM      [1] No Known Allergies  "

## 2024-11-24 ENCOUNTER — Encounter (HOSPITAL_COMMUNITY): Payer: Self-pay

## 2024-11-24 ENCOUNTER — Emergency Department (HOSPITAL_COMMUNITY)

## 2024-11-24 ENCOUNTER — Other Ambulatory Visit: Payer: Self-pay

## 2024-11-24 ENCOUNTER — Emergency Department (HOSPITAL_COMMUNITY)
Admission: EM | Admit: 2024-11-24 | Discharge: 2024-11-24 | Disposition: A | Discharge status: Home / Self Care | Attending: Emergency Medicine | Admitting: Emergency Medicine

## 2024-11-24 DIAGNOSIS — D72819 Decreased white blood cell count, unspecified: Secondary | ICD-10-CM | POA: Insufficient documentation

## 2024-11-24 DIAGNOSIS — Z85048 Personal history of other malignant neoplasm of rectum, rectosigmoid junction, and anus: Secondary | ICD-10-CM | POA: Insufficient documentation

## 2024-11-24 DIAGNOSIS — E860 Dehydration: Secondary | ICD-10-CM | POA: Insufficient documentation

## 2024-11-24 DIAGNOSIS — K5900 Constipation, unspecified: Secondary | ICD-10-CM | POA: Insufficient documentation

## 2024-11-24 LAB — CBG MONITORING, ED: Glucose-Capillary: 97 mg/dL (ref 70–99)

## 2024-11-24 LAB — RESP PANEL BY RT-PCR (RSV, FLU A&B, COVID)  RVPGX2
Influenza A by PCR: NEGATIVE
Influenza B by PCR: NEGATIVE
Resp Syncytial Virus by PCR: NEGATIVE
SARS Coronavirus 2 by RT PCR: NEGATIVE

## 2024-11-24 LAB — COMPREHENSIVE METABOLIC PANEL WITH GFR
ALT: 22 U/L (ref 0–44)
AST: 42 U/L — ABNORMAL HIGH (ref 15–41)
Albumin: 4.4 g/dL (ref 3.5–5.0)
Alkaline Phosphatase: 61 U/L (ref 38–126)
Anion gap: 12 (ref 5–15)
BUN: 11 mg/dL (ref 8–23)
CO2: 25 mmol/L (ref 22–32)
Calcium: 9.4 mg/dL (ref 8.9–10.3)
Chloride: 105 mmol/L (ref 98–111)
Creatinine, Ser: 0.76 mg/dL (ref 0.44–1.00)
GFR, Estimated: >60 mL/min
Glucose, Bld: 101 mg/dL — ABNORMAL HIGH (ref 70–99)
Potassium: 4.6 mmol/L (ref 3.5–5.1)
Sodium: 141 mmol/L (ref 135–145)
Total Bilirubin: 0.3 mg/dL (ref 0.0–1.2)
Total Protein: 7 g/dL (ref 6.5–8.1)

## 2024-11-24 LAB — I-STAT VENOUS BLOOD GAS, ED
Acid-Base Excess: 3 mmol/L — ABNORMAL HIGH (ref 0.0–2.0)
Bicarbonate: 26.5 mmol/L (ref 20.0–28.0)
Calcium, Ion: 1.02 mmol/L — ABNORMAL LOW (ref 1.15–1.40)
HCT: 38 % (ref 36.0–46.0)
Hemoglobin: 12.9 g/dL (ref 12.0–15.0)
O2 Saturation: 98 %
Potassium: 4.2 mmol/L (ref 3.5–5.1)
Sodium: 140 mmol/L (ref 135–145)
TCO2: 28 mmol/L (ref 22–32)
pCO2, Ven: 37.9 mmHg — ABNORMAL LOW (ref 44–60)
pH, Ven: 7.453 — ABNORMAL HIGH (ref 7.25–7.43)
pO2, Ven: 99 mmHg — ABNORMAL HIGH (ref 32–45)

## 2024-11-24 LAB — CBC
HCT: 38.5 % (ref 36.0–46.0)
Hemoglobin: 12.2 g/dL (ref 12.0–15.0)
MCH: 32.5 pg (ref 26.0–34.0)
MCHC: 31.7 g/dL (ref 30.0–36.0)
MCV: 102.7 fL — ABNORMAL HIGH (ref 80.0–100.0)
Platelets: 156 10*3/uL (ref 150–400)
RBC: 3.75 MIL/uL — ABNORMAL LOW (ref 3.87–5.11)
RDW: 13.4 % (ref 11.5–15.5)
WBC: 3.5 10*3/uL — ABNORMAL LOW (ref 4.0–10.5)
nRBC: 0 % (ref 0.0–0.2)

## 2024-11-24 LAB — TSH: TSH: 0.849 u[IU]/mL (ref 0.350–4.500)

## 2024-11-24 LAB — TROPONIN T, HIGH SENSITIVITY: Troponin T High Sensitivity: 23 ng/L — ABNORMAL HIGH (ref 0–19)

## 2024-11-24 MED ORDER — SODIUM CHLORIDE 0.9 % IV BOLUS
500.0000 mL | Freq: Once | INTRAVENOUS | Status: AC
  Administered 2024-11-24: 500 mL via INTRAVENOUS

## 2024-11-24 MED ORDER — POLYETHYLENE GLYCOL 3350 17 GM/SCOOP PO POWD: 17.0000 g | Freq: Every day | ORAL | 0 refills | Status: AC

## 2024-11-24 MED ORDER — ACETAMINOPHEN 500 MG PO TABS
1000.0000 mg | ORAL_TABLET | ORAL | Status: AC
  Administered 2024-11-24: 1000 mg via ORAL
  Filled 2024-11-24: qty 2

## 2024-11-24 MED ORDER — IOHEXOL 350 MG/ML SOLN
75.0000 mL | Freq: Once | INTRAVENOUS | Status: AC | PRN
  Administered 2024-11-24: 75 mL via INTRAVENOUS

## 2024-11-24 NOTE — ED Notes (Signed)
This nurse attempted IV, unsuccessful.

## 2024-11-24 NOTE — Discharge Instructions (Addendum)
 You have a situation called overflow diarrhea where you are pooping softer stool around constipated hard stool that is in your colon.  Below I have printed you some information about bowel regimen and bowel health however I would specifically like you to take 3 capfuls of MiraLAX  tonight/today when you get home and then follow the instructions below specifically like you to take 1 capful of MiraLAX  daily and titrate slowly up on this dose (every 3 days you can increase by 1 capful of MiraLAX  a day) until you are pooping normally.   Hydrate. Fiber intake is important. Make sure to move -- this helps with moving poop.  GETTING TO GOOD BOWEL HEALTH. Irregular bowel habits such as constipation and diarrhea can lead to many problems over time.  Having one soft bowel movement a day is the most important way to prevent further problems.  The anorectal canal is designed to handle stretching and feces to safely manage our ability to get rid of solid waste (feces, poop, stool) out of our body.  BUT, hard constipated stools can act like ripping concrete bricks and diarrhea can be a burning fire to this very sensitive area of our body, causing inflamed hemorrhoids, anal fissures, increasing risk is perirectal abscesses, abdominal pain/bloating, an making irritable bowel worse.     The goal: ONE SOFT BOWEL MOVEMENT A DAY!  To have soft, regular bowel movements:  Drink at least 8 tall glasses of water  a day.   Take plenty of fiber.  Fiber is the undigested part of plant food that passes into the colon, acting s natures broom to encourage bowel motility and movement.  Fiber can absorb and hold large amounts of water . This results in a larger, bulkier stool, which is soft and easier to pass. Work gradually over several weeks up to 6 servings a day of fiber (25g a day even more if needed) in the form of: Vegetables -- Root (potatoes, carrots, turnips), leafy green (lettuce, salad greens, celery, spinach), or cooked high  residue (cabbage, broccoli, etc) Fruit -- Fresh (unpeeled skin & pulp), Dried (prunes, apricots, cherries, etc ),  or stewed ( applesauce)  Whole grain breads, pasta, etc (whole wheat)  Bran cereals  Bulking Agents -- This type of water -retaining fiber generally is easily obtained each day by one of the following:  Psyllium bran -- The psyllium plant is remarkable because its ground seeds can retain so much water . This product is available as Metamucil, Konsyl, Effersyllium, Per Diem Fiber, or the less expensive generic preparation in drug and health food stores. Although labeled a laxative, it really is not a laxative.  Methylcellulose -- This is another fiber derived from wood which also retains water . It is available as Citrucel. Polyethylene Glycol - and artificial fiber commonly called Miralax  or Glycolax .  It is helpful for people with gassy or bloated feelings with regular fiber Flax Seed - a less gassy fiber than psyllium No reading or other relaxing activity while on the toilet. If bowel movements take longer than 5 minutes, you are too constipated AVOID CONSTIPATION.  High fiber and water  intake usually takes care of this.  Sometimes a laxative is needed to stimulate more frequent bowel movements, but  Laxatives are not a good long-term solution as it can wear the colon out. Osmotics (Milk of Magnesia, Fleets phosphosoda, Magnesium  citrate, MiraLax , GoLytely ) are safer than  Stimulants (Senokot, Castor Oil, Dulcolax, Ex Lax)    Do not take laxatives for more than 7days in a  row.  IF SEVERELY CONSTIPATED, try a Bowel Retraining Program: Do not use laxatives.  Eat a diet high in roughage, such as bran cereals and leafy vegetables.  Drink six (6) ounces of prune or apricot juice each morning.  Eat two (2) large servings of stewed fruit each day.  Take one (1) heaping tablespoon of a psyllium-based bulking agent twice a day. Use sugar-free sweetener when possible to avoid excessive  calories.  Eat a normal breakfast.  Set aside 15 minutes after breakfast to sit on the toilet, but do not strain to have a bowel movement.  If you do not have a bowel movement by the third day, use an enema and repeat the above steps.  Controlling diarrhea Switch to liquids and simpler foods for a few days to avoid stressing your intestines further. Avoid dairy products (especially milk & ice cream) for a short time.  The intestines often can lose the ability to digest lactose when stressed. Avoid foods that cause gassiness or bloating.  Typical foods include beans and other legumes, cabbage, broccoli, and dairy foods.  Every person has some sensitivity to other foods, so listen to our body and avoid those foods that trigger problems for you. Adding fiber (Citrucel, Metamucil, psyllium, Miralax ) gradually can help thicken stools by absorbing excess fluid and retrain the intestines to act more normally.  Slowly increase the dose over a few weeks.  Too much fiber too soon can backfire and cause cramping & bloating. Probiotics (such as active yogurt, Align, etc) may help repopulate the intestines and colon with normal bacteria and calm down a sensitive digestive tract.  Most studies show it to be of mild help, though, and such products can be costly. Medicines: Bismuth subsalicylate (ex. Kayopectate, Pepto Bismol) every 30 minutes for up to 6 doses can help control diarrhea.  Avoid if pregnant. Loperamide  (Immodium) can slow down diarrhea.  Start with two tablets (4mg  total) first and then try one tablet every 6 hours.  Avoid if you are having fevers or severe pain.  If you are not better or start feeling worse, stop all medicines and call your doctor for advice Call your doctor if you are getting worse or not better.  Sometimes further testing (cultures, endoscopy, X-ray studies, bloodwork, etc) may be needed to help diagnose and treat the cause of the diarrhea.  Managing Pain  Pain after surgery or  related to activity is often due to strain/injury to muscle, tendon, nerves and/or incisions.  This pain is usually short-term and will improve in a few months.   Many people find it helpful to do the following things TOGETHER to help speed the process of healing and to get back to regular activity more quickly:  Avoid heavy physical activity  no lifting greater than 20 pounds Do not push through the pain.  Listen to your body and avoid positions and maneuvers than reproduce the pain Walking is okay as tolerated, but go slowly and stop when getting sore.  Remember: If it hurts to do it, then dont do it! Take Anti-inflammatory medication  Take with food/snack around the clock for 1-2 weeks This helps the muscle and nerve tissues become less irritable and calm down faster Choose ONE of the following over-the-counter medications: Naproxen 220mg  tabs (ex. Aleve) 1-2 pills twice a day  Ibuprofen 200mg  tabs (ex. Advil, Motrin) 3-4 pills with every meal and just before bedtime Acetaminophen  500mg  tabs (Tylenol ) 1-2 pills with every meal and just before bedtime Use a  Heating pad or Ice/Cold Pack 4-6 times a day May use warm bath/hottub  or showers Try Gentle Massage and/or Stretching  at the area of pain many times a day stop if you feel pain - do not overdo it  Try these steps together to help you body heal faster and avoid making things get worse.  Doing just one of these things may not be enough.    If you are not getting better after two weeks or are noticing you are getting worse, contact our office for further advice; we may need to re-evaluate you & see what other things we can do to help.

## 2024-11-24 NOTE — ED Notes (Signed)
 Patient transported to CT

## 2024-11-24 NOTE — ED Triage Notes (Signed)
 Patient is coming from home. Patient called out for increased weakness and tremors. Patient called out for same approx 2 hours but decided to not come. Patient was recently in rehab, however patient cannot remember what for. Noticed she has been becoming weaker over the past couple of weeks with diarrhea in the past 2 weeks.  EMS VS 160/80 BP 80 HR 98% RA 113 CBG

## 2024-11-24 NOTE — ED Provider Notes (Signed)
 " [facility] Provider Note   CSN: [id number] Arrival date & time: 11/24/24  0559     Patient presents with: Weakness   [name] is a 79 y.o. female.    Weakness  Patient is a 79 year old female with a past medical history significant for anal cancer, vitamin D  D deficiency, rectal bleeding--seemingly from when she had cancer.  Patient brought in by EMS from home for increased weakness and fatigue and some tremors.  She states that she has been very cold and feels weak.  She denies feeling short of breath currently but states that she sometimes does.  She is not coughing more than usual she denies any burning when she pees or peeing more often and is not have any changes of her bowel movements she states that she is eating and drinking normally.  No recreational drug use.  No recent medication changes however she was recently in the hospital for pneumonia admitted 10/26/2024 and discharged 11/01/2024 during her hospital stay she was having watery diarrhea and tested negative for C. difficile as well as GI pathogen panel.  She denies any blood in her stool.  She is having 5-6 episodes of loose stool per day.  Seems that she has been home from rehab for about 1-1/2 weeks and continues to feel weak and tired she denies any nausea or vomiting.  No fevers at home.  She is able to walk with a walker at home.     Prior to Admission medications  Medication Sig Start Date End Date Taking? Authorizing Provider  polyethylene glycol powder (GLYCOLAX /MIRALAX ) 17 GM/SCOOP powder Take 17 g by mouth daily. One scoop in beverage of your choice with each meal. You may increase if you continue to be constipated and decrease if your stool becomes too loose. 11/24/24  Yes Naziya Hegwood S, PA  acetaminophen  (TYLENOL ) 500 MG tablet Take 1,000 mg by mouth in the morning and at bedtime.    [provider]  alendronate (FOSAMAX) 10 MG tablet Take 10 mg by  mouth daily.    [provider]  ARIPiprazole  (ABILIFY ) 10 MG tablet Take 10 mg by mouth daily. 09/17/24   [provider]  BIOTIN PO Take 1 tablet by mouth daily.    [provider]  Cholecalciferol  (VITAMIN D -3 PO) Take 1 capsule by mouth at bedtime.    [provider]  Cyanocobalamin  (VITAMIN B-12 PO) Take 1 tablet by mouth daily.    [provider]  donepezil  (ARICEPT ) 10 MG tablet Take 10 mg by mouth at bedtime. 10/05/24   [provider]  DULoxetine  (CYMBALTA ) 60 MG capsule Take 120 mg by mouth daily.    [provider]  furosemide  (LASIX ) 40 MG tablet Take 1 tablet (40 mg total) by mouth daily as needed for fluid or edema. 11/01/24   Ghimire, Donalda HERO, MD  gabapentin  (NEURONTIN ) 600 MG tablet Take 0.5 tablets (300 mg total) by mouth 2 (two) times daily. 11/01/24   Ghimire, Donalda HERO, MD  hydrOXYzine (ATARAX) 25 MG tablet Take 25-50 mg by mouth every 6 (six) hours as needed for anxiety. 10/22/24   [provider]  levothyroxine  (SYNTHROID , LEVOTHROID) 25 MCG tablet Take 25 mcg by mouth daily before breakfast. 08/29/18   [provider]  Multiple Vitamins-Minerals (MULTIVITAMIN WOMEN 50+) TABS Take 1 tablet by mouth daily.    [provider]  Omega-3 Fatty Acids (FISH OIL PO) Take 1 capsule by mouth daily  in the afternoon.    [provider]  pravastatin  (PRAVACHOL ) 40 MG tablet Take 40 mg by mouth daily in the afternoon. 09/15/24   [provider]  QUEtiapine  (SEROQUEL ) 200 MG tablet Take 0.5 tablets (100 mg total) by mouth at bedtime. 11/01/24   Ghimire, Donalda HERO, MD  valsartan (DIOVAN) 320 MG tablet Take 320 mg by mouth daily in the afternoon.    [provider]    Allergies: Patient has no known allergies.    Review of Systems  Neurological:  Positive for weakness.    Updated Vital Signs BP 120/68   Pulse 90   Temp (!) 97.4 F (36.3 C)   Resp 18   Ht 5' 5 (1.651 m)    Wt 80.3 kg   SpO2 100%   BMI 29.45 kg/m   Physical Exam Vitals and nursing note reviewed.  Constitutional:      General: She is not in acute distress.    Comments: Pleasant 79 year old female smiles, interactive, oriented  HENT:     Head: Normocephalic and atraumatic.     Nose: Nose normal.     Mouth/Throat:     Mouth: Mucous membranes are dry.  Eyes:     General: No scleral icterus. Cardiovascular:     Rate and Rhythm: Normal rate and regular rhythm.     Pulses: Normal pulses.     Heart sounds: Normal heart sounds.  Pulmonary:     Effort: Pulmonary effort is normal. No respiratory distress.     Breath sounds: No wheezing.  Chest:     Chest wall: No tenderness.  Abdominal:     Palpations: Abdomen is soft.     Tenderness: There is abdominal tenderness.     Comments: Patient expressed some discomfort with palpation of the abdomen no guarding or rebound  Musculoskeletal:     Cervical back: Normal range of motion.     Right lower leg: No edema.     Left lower leg: No edema.     Comments: Feet with toe deformities consistent with arthritis  Skin:    General: Skin is warm and dry.     Capillary Refill: Capillary refill takes less than 2 seconds.  Neurological:     Mental Status: She is alert. Mental status is at baseline.  Psychiatric:        Mood and Affect: Mood normal.        Behavior: Behavior normal.     (all labs ordered are listed, but only abnormal results are displayed) Labs Reviewed  COMPREHENSIVE METABOLIC PANEL WITH GFR - Abnormal; Notable for the following components:      Result Value   Glucose, Bld 101 (*)    AST 42 (*)    All other components within normal limits  CBC - Abnormal; Notable for the following components:   WBC 3.5 (*)    RBC 3.75 (*)    MCV 102.7 (*)    All other components within normal limits  I-STAT VENOUS BLOOD GAS, ED - Abnormal; Notable for the following components:   pH, Ven 7.453 (*)    pCO2, Ven 37.9 (*)    pO2, Ven 99 (*)     Acid-Base Excess 3.0 (*)    Calcium, Ion 1.02 (*)    All other components within normal limits  TROPONIN T, HIGH SENSITIVITY - Abnormal; Notable for the following components:   Troponin T High Sensitivity 23 (*)    All other components within normal limits  RESP  PANEL BY RT-PCR (RSV, FLU A&B, COVID)  RVPGX2  TSH  URINALYSIS, ROUTINE W REFLEX MICROSCOPIC  CBG MONITORING, ED    EKG: EKG Interpretation Date/Time:  Saturday November 24 2024 10:33:28 EST Ventricular Rate:  63 PR Interval:  180 QRS Duration:  94 QT Interval:  482 QTC Calculation: 494 R Axis:   -26  Text Interpretation: Sinus rhythm Atrial premature complex Borderline left axis deviation Low voltage, precordial leads Borderline prolonged QT interval Confirmed by Randol Simmonds [phone]) on 11/24/2024 11:16:04 AM  Radiology: CT ABDOMEN PELVIS W CONTRAST Result Date: 11/24/2024 EXAM: CT ABDOMEN AND PELVIS WITH CONTRAST 11/24/2024 09:57:10 AM TECHNIQUE: CT of the abdomen and pelvis was performed with the administration of 75 mL of iohexol  (OMNIPAQUE ) 350 MG/ML injection. Multiplanar reformatted images are provided for review. Automated exposure control, iterative reconstruction, and/or weight-based adjustment of the mA/kV was utilized to reduce the radiation dose to as low as reasonably achievable. COMPARISON: CT abdomen and pelvis 10/26/2024. CLINICAL HISTORY: 79 year old female with abdominal pain, acute, nonlocalized. Increased weakness and tremor. FINDINGS: LOWER CHEST: Elevation of the right hemidiaphragm is stable. No pericardial or pleural effusion. Lung base atelectasis has regressed but not fully resolved. LIVER: The liver is unremarkable. GALLBLADDER AND BILE DUCTS: Gallbladder is unremarkable. No biliary ductal dilatation. SPLEEN: Stable spleen with small chronic circumscribed low density area compatible with benign cyst or hemangioma (no follow up imaging recommended). PANCREAS: No acute abnormality. ADRENAL GLANDS: No acute  abnormality. KIDNEYS, URETERS AND BLADDER: Symmetric renal enhancement and early contrast excretion. No stones in the kidneys or ureters. No hydronephrosis. No perinephric or periureteral stranding. Diminutive urinary bladder. GI AND BOWEL: Moderate increased large bowel retained stool throughout, including 4 to 5 cm diameter stool ball in the rectum. No large bowel dilatation or inflammation. Nondilated small bowel. Mostly decompressed stomach. PERITONEUM AND RETROPERITONEUM: No ascites. No free air. VASCULATURE: Aorta is normal in caliber. Advanced aortoiliac calcified atherosclerosis. Major arterial structures in the abdomen and pelvis appear patent. Grossly patent portal venous system. LYMPH NODES: No lymphadenopathy. REPRODUCTIVE ORGANS: Clinically absent uterus, diminutive or absent ovaries. BONES AND SOFT TISSUES: Widespread spinal compression fractures including T10 vertebra plana, moderate compression of L1, L3, L5, appears stable from last month. Underlying generalized osteopenia. Stable chronic appearing lower sacral fracture at S5. No acute osseous abnormality. No focal soft tissue abnormality. IMPRESSION: 1. Increased and moderate colonic stool burden. No bowel inflammation or obstruction. 2. No acute or inflammatory process identified in the abdomen or pelvis. 3. Stable advanced spinal compression fractures. Aortic Atherosclerosis (ICD10-I70.0). Electronically signed by: Helayne Hurst MD 11/24/2024 11:20 AM EST RP Workstation: [id number]   DG Chest 2 View Result Date: 11/24/2024 EXAM: 2 VIEW(S) XRAY OF THE CHEST 11/24/2024 08:21:00 AM COMPARISON: 10/26/2024 CLINICAL HISTORY: 79 year old female with fatigue, weakness, increasing weakness, and tremor. FINDINGS: LUNGS AND PLEURA: Low lung volumes. Persistent elevated right hemidiaphragm. No focal pulmonary opacity. No pleural effusion. No pneumothorax. HEART AND MEDIASTINUM: Atherosclerotic calcifications. Stable cardiac contour, exaggerated by technique  and low lung volumes. No acute abnormality of the mediastinal silhouette. BONES AND SOFT TISSUES: Multilevel degenerative changes of thoracic spine. Osteopenia. Chronic spinal compression fractures. IMPRESSION: 1. No acute cardiopulmonary abnormality. Low lung volumes with basilar hypoventilation. Electronically signed by: Helayne Hurst MD 11/24/2024 08:42 AM EST RP Workstation: [id number]     Procedures   Medications Ordered in the ED  acetaminophen  (TYLENOL ) tablet 1,000 mg (1,000 mg Oral Given 11/24/24 0832)  sodium chloride  0.9 % bolus 500 mL (0 mLs Intravenous Stopped 11/24/24 1030)  iohexol  (OMNIPAQUE ) 350 MG/ML injection 75 mL (75 mLs Intravenous Contrast Given 11/24/24 0958)    Clinical Course as of 11/24/24 1444  Sat Nov 24, 2024  0737 Had cdiff and stool panel done. Has continued to have diarrhea.   Has irritated bottom. 1.5 weeks home from rehab.  5-6 eps of loose stool/.  [WF]    Clinical Course User Index [WF] Neldon Hamp RAMAN, PA                                 Medical Decision Making Amount and/or Complexity of Data Reviewed Labs: ordered. Radiology: ordered.  Risk OTC drugs. Prescription drug management.   This patient presents to the ED for concern of fatigue, this involves a number of treatment options, and is a complaint that carries with it a moderate risk of complications and morbidity. A differential diagnosis was considered for the patient's symptoms which is discussed below:   The differential diagnosis of weakness includes but is not limited to neurologic causes (GBS, myasthenia gravis, CVA, MS, ALS, transverse myelitis, spinal cord injury, CVA, botulism, ) and other causes: ACS (very unlikely), Arrhythmia, syncope, orthostatic hypotension, sepsis, hypoglycemia, electrolyte disturbance, hypothyroidism, respiratory failure, symptomatic anemia, dehydration, heat injury, polypharmacy, malignancy.    Co morbidities: Discussed in HPI   Brief  History:  Patient is a 79 year old female with a past medical history significant for anal cancer, vitamin D  D deficiency, rectal bleeding--seemingly from when she had cancer.  Patient brought in by EMS from home for increased weakness and fatigue and some tremors.  She states that she has been very cold and feels weak.  She denies feeling short of breath currently but states that she sometimes does.  She is not coughing more than usual she denies any burning when she pees or peeing more often and is not have any changes of her bowel movements she states that she is eating and drinking normally.  No recreational drug use.  No recent medication changes however she was recently in the hospital for pneumonia admitted 10/26/2024 and discharged 11/01/2024 during her hospital stay she was having watery diarrhea and tested negative for C. difficile as well as GI pathogen panel.  She denies any blood in her stool.  She is having 5-6 episodes of loose stool per day.  Seems that she has been home from rehab for about 1-1/2 weeks and continues to feel weak and tired she denies any nausea or vomiting.  No fevers at home.  She is able to walk with a walker at home.    EMR reviewed including pt PMHx, past surgical history and past visits to ER.   See HPI for more details   Lab Tests:   I ordered and independently interpreted labs. Labs notable for No anemia notably patient has been anemic before which makes me consider some hemoconcentration.  Mild leukopenia making viral illness more likely.  Not hypercarbic per VBG.  CMP with marginally elevated AST of 42 can have this rechecked outpatient.  CT abdomen pelvis with colonic stool burden.  No acute infectious findings.  Imaging Studies:  See above   Cardiac Monitoring:  The patient was maintained on a cardiac monitor.  I personally viewed and interpreted the cardiac monitored which showed an underlying rhythm of: NSR EKG non-ischemic   Medicines  ordered:  I ordered medication including 500 mL of normal saline, Tylenol  for pain and dehydration Reevaluation of the patient  after these medicines showed that the patient improved I have reviewed the patients home medicines and have made adjustments as needed   Critical Interventions:     Consults/Attending Physician   I discussed this case with my attending physician who cosigned this note including patient's presenting symptoms, physical exam, and planned diagnostics and interventions. Attending physician stated agreement with plan or made changes to plan which were implemented.   Reevaluation:  After the interventions noted above I re-evaluated patient and found that they have :improved   Social Determinants of Health:      Problem List / ED Course:  Patient with some weakness and fatigue that has been ongoing since her hospital stay she seems to not have had any acute worsening but rather feels fatigued and has had continued diarrhea she had a negative C. difficile and stool panel already obtained and has been on no additional antibiotics since her hospital visit.  She did appear somewhat dehydrated on my exam and feels improved after IV fluids.  She has evidence of constipation on CT and I suspect she has some overflow diarrhea.  I discussed this case with my supervising physician who agrees that MiraLAX  bowel regimen is reasonable and increasing fiber intake and maintaining proper hydration are important.  She understands this and I discussed this in her discharge instructions and sent a text to her caregiver with these details.  Patient agreeable to discharge home and is requesting sandwich.  Tolerating p.o. and seems to be at her mental and functional baseline.   Dispostion:  After consideration of the diagnostic results and the patients response to treatment, I feel that the patent would benefit from outpatient follow-up.    Final diagnoses:  Dehydration   Constipation, unspecified constipation type    ED Discharge Orders          Ordered    polyethylene glycol powder (GLYCOLAX /MIRALAX ) 17 GM/SCOOP powder  Daily        11/24/24 1209    Ambulatory referral to Gastroenterology        11/24/24 [address] [place], [place] 11/24/24 1444  "

## 2024-11-24 NOTE — ED Notes (Addendum)
 This nurse spoke with caretaker Victoria on patient's phone. Caretaker stated that patient was sent to rehab after discharge from sepsis diagnosis and has progressive gotten weaker since. Continues on with saying that the diarrhea has been happening since before her sepsis diagnosis. Was at [facility] for rehab.

## 2024-11-29 ENCOUNTER — Emergency Department (HOSPITAL_COMMUNITY)
Admission: EM | Admit: 2024-11-29 | Discharge: 2024-11-29 | Disposition: A | Discharge status: Home / Self Care | Attending: Emergency Medicine | Admitting: Emergency Medicine

## 2024-11-29 ENCOUNTER — Other Ambulatory Visit: Payer: Self-pay

## 2024-11-29 ENCOUNTER — Encounter (HOSPITAL_COMMUNITY): Payer: Self-pay | Admitting: Emergency Medicine

## 2024-11-29 DIAGNOSIS — Z79899 Other long term (current) drug therapy: Secondary | ICD-10-CM | POA: Insufficient documentation

## 2024-11-29 DIAGNOSIS — I10 Essential (primary) hypertension: Secondary | ICD-10-CM | POA: Diagnosis present

## 2024-11-29 DIAGNOSIS — R030 Elevated blood-pressure reading, without diagnosis of hypertension: Secondary | ICD-10-CM

## 2024-11-29 NOTE — Discharge Instructions (Signed)
 It was our pleasure to provide your ER care today - we hope that you feel better.  Continue your blood pressure medication. Limit salt intake and follow heart healthy meal plan.   Follow up closely with your doctor in the next 1-2 weeks.   Return to ER if worse, new symptoms, new/severe pain, chest pain, trouble breathing, or other concern.

## 2024-11-29 NOTE — ED Provider Notes (Signed)
 " [place] EM[facility] DEPARTMENT AT [facility] Provider Note   CSN: [id number] Arrival date & time: 11/29/24  1507     Patient presents with: Hypertension   [name] is a 79 y.o. female.   Pt with c/o having blood pressure measures at home, was initially 188/106, and told to go to ED. Patient with hx htn, takes meds for  same. Pt relatively limited historian. Denies change in meds. Pt indicates otherwise feels fine, at baseline. No headache. No change in vision or speech. No new numbness or weakness. No chest pain or discomfort. No sob or unusual doe. No new swelling.   The history is provided by the patient, the EMS personnel and medical records. The history is limited by the condition of the patient.  Hypertension Pertinent negatives include no chest pain, no abdominal pain, no headaches and no shortness of breath.       Prior to Admission medications  Medication Sig Start Date End Date Taking? Authorizing Provider  acetaminophen  (TYLENOL ) 500 MG tablet Take 1,000 mg by mouth in the morning and at bedtime.    [provider]  alendronate (FOSAMAX) 10 MG tablet Take 10 mg by mouth daily.    [provider]  ARIPiprazole  (ABILIFY ) 10 MG tablet Take 10 mg by mouth daily. 09/17/24   [provider]  BIOTIN PO Take 1 tablet by mouth daily.    [provider]  Cholecalciferol  (VITAMIN D -3 PO) Take 1 capsule by mouth at bedtime.    [provider]  Cyanocobalamin  (VITAMIN B-12 PO) Take 1 tablet by mouth daily.    [provider]  donepezil  (ARICEPT ) 10 MG tablet Take 10 mg by mouth at bedtime. 10/05/24   [provider]  DULoxetine  (CYMBALTA ) 60 MG capsule Take 120 mg by mouth daily.    [provider]  furosemide  (LASIX ) 40 MG tablet Take 1 tablet (40 mg total) by mouth daily as needed for fluid or edema. 11/01/24   Ghimire, Donalda HERO, MD  gabapentin  (NEURONTIN ) 600 MG tablet Take 0.5 tablets (300 mg  total) by mouth 2 (two) times daily. 11/01/24   Ghimire, Donalda HERO, MD  hydrOXYzine (ATARAX) 25 MG tablet Take 25-50 mg by mouth every 6 (six) hours as needed for anxiety. 10/22/24   [provider]  levothyroxine  (SYNTHROID , LEVOTHROID) 25 MCG tablet Take 25 mcg by mouth daily before breakfast. 08/29/18   [provider]  Multiple Vitamins-Minerals (MULTIVITAMIN WOMEN 50+) TABS Take 1 tablet by mouth daily.    [provider]  Omega-3 Fatty Acids (FISH OIL PO) Take 1 capsule by mouth daily in the afternoon.    [provider]  polyethylene glycol powder (GLYCOLAX /MIRALAX ) 17 GM/SCOOP powder Take 17 g by mouth daily. One scoop in beverage of your choice with each meal. You may increase if you continue to be constipated and decrease if your stool becomes too loose. 11/24/24   Neldon Hamp RAMAN, PA  pravastatin  (PRAVACHOL ) 40 MG tablet Take 40 mg by mouth daily in the afternoon. 09/15/24   [provider]  QUEtiapine  (SEROQUEL ) 200 MG tablet Take 0.5 tablets (100 mg total) by mouth at bedtime. 11/01/24   Ghimire, Donalda HERO, MD  valsartan (DIOVAN) 320 MG tablet Take 320 mg by mouth daily in the afternoon.    [provider]    Allergies: Patient has no known allergies.    Review of Systems  Constitutional:  Negative for chills and fever.  Eyes:  Negative  for visual disturbance.  Respiratory:  Negative for shortness of breath.   Cardiovascular:  Negative for chest pain.  Gastrointestinal:  Negative for abdominal pain, nausea and vomiting.  Musculoskeletal:  Negative for back pain and neck pain.  Neurological:  Negative for speech difficulty, weakness, numbness and headaches.    Updated Vital Signs BP (!) 178/81   Pulse (!) 55   Temp 98.1 F (36.7 C) (Oral)   Resp 18   Ht 1.651 m (5' 5)   Wt 77.1 kg   SpO2 100%   BMI 28.29 kg/m   Physical Exam Vitals and nursing note reviewed.  Constitutional:      Appearance: Normal appearance. She is  well-developed.  HENT:     Head: Atraumatic.     Nose: Nose normal.     Mouth/Throat:     Mouth: Mucous membranes are moist.  Eyes:     General: No scleral icterus.    Conjunctiva/sclera: Conjunctivae normal.  Neck:     Trachea: No tracheal deviation.  Cardiovascular:     Rate and Rhythm: Normal rate and regular rhythm.     Pulses: Normal pulses.     Heart sounds: Normal heart sounds. No murmur heard.    No friction rub. No gallop.  Pulmonary:     Effort: Pulmonary effort is normal. No respiratory distress.     Breath sounds: Normal breath sounds.  Abdominal:     General: There is no distension.     Palpations: Abdomen is soft.     Tenderness: There is no abdominal tenderness.  Musculoskeletal:        General: No swelling or tenderness.     Cervical back: Normal range of motion and neck supple. No rigidity. No muscular tenderness.  Skin:    General: Skin is warm and dry.     Findings: No rash.  Neurological:     Mental Status: She is alert.     Comments: Alert, speech normal. Motor/sens grossly intact bil.   Psychiatric:        Mood and Affect: Mood normal.     (all labs ordered are listed, but only abnormal results are displayed) Labs Reviewed - No data to display  EKG: None  Radiology: No results found.   Procedures   Medications Ordered in the ED - No data to display                                  Medical Decision Making Problems Addressed: Elevated blood pressure reading: acute illness or injury Essential hypertension: chronic illness or injury with exacerbation, progression, or side effects of treatment that poses a threat to life or bodily functions  Amount and/or Complexity of Data Reviewed Independent Historian: EMS    Details: hx External Data Reviewed: notes. Labs:  Decision-making details documented in ED Course.   Reviewed nursing notes and prior charts for additional history.   EMS repeat bp was 172/92, and currently in ED bp is  178/81, which while high, does not require emergent lowering.   Pt with recent labs reviewed/interpreted by me - hgb 12, normal, TSH normal, and chemistries unremarkable.   Po fluids/food.   Pt remains asymptomatic.   Rec pcp f/u re htn. Pt indicates has adequate of her meds at home.        Final diagnoses:  Elevated blood pressure reading  Essential hypertension    ED Discharge Orders  None          Bernard Drivers, MD 11/29/24 1811  "

## 2024-11-29 NOTE — ED Triage Notes (Signed)
 Pt to ER via EMS from home with c/o HTN.  Pt states has had high BP in past but is not on medications for same.  States Home Health took bp today and when she called PCP was advised to come to ED.  Per EMS initial BP 188/106, repeat 172/92.  Pt c/o nausea only.
